# Patient Record
Sex: Female | Born: 1945
Health system: Southern US, Community
[De-identification: ages and names within clinical notes are randomized; demographics above are authoritative.]

## PROBLEM LIST (undated history)

## (undated) DIAGNOSIS — R0600 Dyspnea, unspecified: Secondary | ICD-10-CM

## (undated) DIAGNOSIS — I6529 Occlusion and stenosis of unspecified carotid artery: Secondary | ICD-10-CM

## (undated) DIAGNOSIS — F329 Major depressive disorder, single episode, unspecified: Secondary | ICD-10-CM

## (undated) DIAGNOSIS — K219 Gastro-esophageal reflux disease without esophagitis: Secondary | ICD-10-CM

## (undated) DIAGNOSIS — C801 Malignant (primary) neoplasm, unspecified: Secondary | ICD-10-CM

## (undated) DIAGNOSIS — I10 Essential (primary) hypertension: Secondary | ICD-10-CM

## (undated) DIAGNOSIS — I639 Cerebral infarction, unspecified: Secondary | ICD-10-CM

## (undated) DIAGNOSIS — F32A Depression, unspecified: Secondary | ICD-10-CM

## (undated) DIAGNOSIS — F419 Anxiety disorder, unspecified: Secondary | ICD-10-CM

## (undated) HISTORY — PX: ABDOMINAL HYSTERECTOMY: SHX81

## (undated) HISTORY — DX: Essential (primary) hypertension: I10

## (undated) HISTORY — DX: Occlusion and stenosis of unspecified carotid artery: I65.29

---

## 2017-04-01 ENCOUNTER — Encounter (HOSPITAL_COMMUNITY): Payer: Self-pay

## 2017-04-01 ENCOUNTER — Inpatient Hospital Stay (HOSPITAL_COMMUNITY)
Admission: EM | Admit: 2017-04-01 | Discharge: 2017-04-09 | DRG: 038 | Disposition: A | Discharge status: Rehabilitation Facility | Payer: Medicare Other | Attending: Internal Medicine | Admitting: Internal Medicine

## 2017-04-01 ENCOUNTER — Emergency Department (HOSPITAL_COMMUNITY): Payer: Medicare Other

## 2017-04-01 ENCOUNTER — Observation Stay (HOSPITAL_COMMUNITY): Payer: Medicare Other

## 2017-04-01 DIAGNOSIS — G8194 Hemiplegia, unspecified affecting left nondominant side: Secondary | ICD-10-CM | POA: Diagnosis not present

## 2017-04-01 DIAGNOSIS — I161 Hypertensive emergency: Secondary | ICD-10-CM | POA: Diagnosis not present

## 2017-04-01 DIAGNOSIS — I639 Cerebral infarction, unspecified: Secondary | ICD-10-CM

## 2017-04-01 DIAGNOSIS — G8918 Other acute postprocedural pain: Secondary | ICD-10-CM

## 2017-04-01 DIAGNOSIS — R531 Weakness: Secondary | ICD-10-CM

## 2017-04-01 DIAGNOSIS — R2990 Unspecified symptoms and signs involving the nervous system: Secondary | ICD-10-CM | POA: Diagnosis not present

## 2017-04-01 DIAGNOSIS — R739 Hyperglycemia, unspecified: Secondary | ICD-10-CM | POA: Diagnosis not present

## 2017-04-01 DIAGNOSIS — R29818 Other symptoms and signs involving the nervous system: Secondary | ICD-10-CM

## 2017-04-01 DIAGNOSIS — R29898 Other symptoms and signs involving the musculoskeletal system: Secondary | ICD-10-CM

## 2017-04-01 DIAGNOSIS — R269 Unspecified abnormalities of gait and mobility: Secondary | ICD-10-CM

## 2017-04-01 DIAGNOSIS — I6522 Occlusion and stenosis of left carotid artery: Secondary | ICD-10-CM

## 2017-04-01 DIAGNOSIS — E785 Hyperlipidemia, unspecified: Secondary | ICD-10-CM

## 2017-04-01 DIAGNOSIS — I63232 Cerebral infarction due to unspecified occlusion or stenosis of left carotid arteries: Principal | ICD-10-CM | POA: Diagnosis present

## 2017-04-01 DIAGNOSIS — I10 Essential (primary) hypertension: Secondary | ICD-10-CM

## 2017-04-01 DIAGNOSIS — I69398 Other sequelae of cerebral infarction: Secondary | ICD-10-CM

## 2017-04-01 DIAGNOSIS — Z8673 Personal history of transient ischemic attack (TIA), and cerebral infarction without residual deficits: Secondary | ICD-10-CM

## 2017-04-01 DIAGNOSIS — N390 Urinary tract infection, site not specified: Secondary | ICD-10-CM | POA: Diagnosis not present

## 2017-04-01 DIAGNOSIS — I69359 Hemiplegia and hemiparesis following cerebral infarction affecting unspecified side: Secondary | ICD-10-CM

## 2017-04-01 DIAGNOSIS — E876 Hypokalemia: Secondary | ICD-10-CM | POA: Diagnosis present

## 2017-04-01 DIAGNOSIS — R464 Slowness and poor responsiveness: Secondary | ICD-10-CM

## 2017-04-01 DIAGNOSIS — I63132 Cerebral infarction due to embolism of left carotid artery: Secondary | ICD-10-CM | POA: Diagnosis present

## 2017-04-01 DIAGNOSIS — Z9889 Other specified postprocedural states: Secondary | ICD-10-CM

## 2017-04-01 DIAGNOSIS — I679 Cerebrovascular disease, unspecified: Secondary | ICD-10-CM

## 2017-04-01 HISTORY — DX: Cerebral infarction, unspecified: I63.9

## 2017-04-01 LAB — DIFFERENTIAL
BASOS ABS: 0 10*3/uL (ref 0.0–0.1)
Basophils Relative: 1 %
EOS ABS: 0.1 10*3/uL (ref 0.0–0.7)
Eosinophils Relative: 1 %
LYMPHS PCT: 14 %
Lymphs Abs: 0.8 10*3/uL (ref 0.7–4.0)
Monocytes Absolute: 0.3 10*3/uL (ref 0.1–1.0)
Monocytes Relative: 5 %
NEUTROS ABS: 4.4 10*3/uL (ref 1.7–7.7)
NEUTROS PCT: 79 %

## 2017-04-01 LAB — COMPREHENSIVE METABOLIC PANEL
ALBUMIN: 3.7 g/dL (ref 3.5–5.0)
ALK PHOS: 68 U/L (ref 38–126)
ALT: 12 U/L — ABNORMAL LOW (ref 14–54)
AST: 17 U/L (ref 15–41)
Anion gap: 10 (ref 5–15)
BILIRUBIN TOTAL: 0.9 mg/dL (ref 0.3–1.2)
BUN: 9 mg/dL (ref 6–20)
CO2: 24 mmol/L (ref 22–32)
Calcium: 9.1 mg/dL (ref 8.9–10.3)
Chloride: 106 mmol/L (ref 101–111)
Creatinine, Ser: 0.81 mg/dL (ref 0.44–1.00)
GFR calc Af Amer: >60 mL/min (ref >60)
GFR calc non Af Amer: >60 mL/min (ref >60)
GLUCOSE: 119 mg/dL — AB (ref 65–99)
POTASSIUM: 3.2 mmol/L — AB (ref 3.5–5.1)
Sodium: 140 mmol/L (ref 135–145)
TOTAL PROTEIN: 6.7 g/dL (ref 6.5–8.1)

## 2017-04-01 LAB — CBC
HCT: 42.2 % (ref 36.0–46.0)
HEMOGLOBIN: 13.5 g/dL (ref 12.0–15.0)
MCH: 25.8 pg — ABNORMAL LOW (ref 26.0–34.0)
MCHC: 32 g/dL (ref 30.0–36.0)
MCV: 80.7 fL (ref 78.0–100.0)
Platelets: 161 10*3/uL (ref 150–400)
RBC: 5.23 MIL/uL — AB (ref 3.87–5.11)
RDW: 14 % (ref 11.5–15.5)
WBC: 5.6 10*3/uL (ref 4.0–10.5)

## 2017-04-01 LAB — URINALYSIS, ROUTINE W REFLEX MICROSCOPIC
Bilirubin Urine: NEGATIVE
Glucose, UA: NEGATIVE mg/dL
Ketones, ur: 80 mg/dL — AB
Nitrite: NEGATIVE
Protein, ur: NEGATIVE mg/dL
SPECIFIC GRAVITY, URINE: 1.021 (ref 1.005–1.030)
pH: 5 (ref 5.0–8.0)

## 2017-04-01 LAB — ETHANOL: Alcohol, Ethyl (B): <5 mg/dL (ref <5)

## 2017-04-01 LAB — APTT: APTT: 33 s (ref 24–36)

## 2017-04-01 LAB — RAPID URINE DRUG SCREEN, HOSP PERFORMED
Amphetamines: NOT DETECTED
BARBITURATES: NOT DETECTED
Benzodiazepines: NOT DETECTED
COCAINE: NOT DETECTED
Opiates: NOT DETECTED
TETRAHYDROCANNABINOL: NOT DETECTED

## 2017-04-01 LAB — I-STAT CHEM 8, ED
BUN: 9 mg/dL (ref 6–20)
CREATININE: 0.8 mg/dL (ref 0.44–1.00)
Calcium, Ion: 1.09 mmol/L — ABNORMAL LOW (ref 1.15–1.40)
Chloride: 104 mmol/L (ref 101–111)
Glucose, Bld: 117 mg/dL — ABNORMAL HIGH (ref 65–99)
HEMATOCRIT: 41 % (ref 36.0–46.0)
Hemoglobin: 13.9 g/dL (ref 12.0–15.0)
Potassium: 3.2 mmol/L — ABNORMAL LOW (ref 3.5–5.1)
Sodium: 141 mmol/L (ref 135–145)
TCO2: 25 mmol/L (ref 0–100)

## 2017-04-01 LAB — TROPONIN I

## 2017-04-01 LAB — PROTIME-INR
INR: 1.08
Prothrombin Time: 14.1 seconds (ref 11.4–15.2)

## 2017-04-01 LAB — I-STAT TROPONIN, ED: Troponin i, poc: 0 ng/mL (ref 0.00–0.08)

## 2017-04-01 MED ORDER — SODIUM CHLORIDE 0.9 % IV SOLN: 250.0000 mL | INTRAVENOUS | Status: DC | PRN

## 2017-04-01 MED ORDER — POTASSIUM CHLORIDE CRYS ER 20 MEQ PO TBCR
40.0000 meq | EXTENDED_RELEASE_TABLET | Freq: Two times a day (BID) | ORAL | Status: AC
  Administered 2017-04-01: 40 meq via ORAL
  Filled 2017-04-01: qty 2

## 2017-04-01 MED ORDER — SODIUM CHLORIDE 0.9% FLUSH
3.0000 mL | Freq: Two times a day (BID) | INTRAVENOUS | Status: DC
  Administered 2017-04-01 – 2017-04-09 (×7): 3 mL via INTRAVENOUS

## 2017-04-01 MED ORDER — SENNOSIDES-DOCUSATE SODIUM 8.6-50 MG PO TABS
1.0000 | ORAL_TABLET | Freq: Every evening | ORAL | Status: DC | PRN
  Administered 2017-04-05: 1 via ORAL
  Filled 2017-04-01: qty 1

## 2017-04-01 MED ORDER — SODIUM CHLORIDE 0.9 % IV SOLN
INTRAVENOUS | Status: DC
  Administered 2017-04-01: 15:00:00 via INTRAVENOUS

## 2017-04-01 MED ORDER — ONDANSETRON HCL 4 MG PO TABS: 4.0000 mg | ORAL_TABLET | Freq: Four times a day (QID) | ORAL | Status: DC | PRN

## 2017-04-01 MED ORDER — ASPIRIN 325 MG PO TABS
325.0000 mg | ORAL_TABLET | Freq: Every day | ORAL | Status: DC
  Administered 2017-04-01 – 2017-04-09 (×8): 325 mg via ORAL
  Filled 2017-04-01 (×8): qty 1

## 2017-04-01 MED ORDER — GADOBENATE DIMEGLUMINE 529 MG/ML IV SOLN
20.0000 mL | Freq: Once | INTRAVENOUS | Status: AC
  Administered 2017-04-01: 14 mL via INTRAVENOUS

## 2017-04-01 MED ORDER — LABETALOL HCL 5 MG/ML IV SOLN
10.0000 mg | INTRAVENOUS | Status: DC | PRN
  Administered 2017-04-01: 10 mg via INTRAVENOUS
  Filled 2017-04-01 (×2): qty 4

## 2017-04-01 MED ORDER — STROKE: EARLY STAGES OF RECOVERY BOOK
Freq: Once | Status: AC
  Administered 2017-04-01: 1
  Filled 2017-04-01: qty 1

## 2017-04-01 MED ORDER — SODIUM CHLORIDE 0.9% FLUSH: 3.0000 mL | INTRAVENOUS | Status: DC | PRN

## 2017-04-01 MED ORDER — ONDANSETRON HCL 4 MG/2ML IJ SOLN: 4.0000 mg | Freq: Four times a day (QID) | INTRAMUSCULAR | Status: DC | PRN

## 2017-04-01 MED ORDER — ACETAMINOPHEN 650 MG RE SUPP: 650.0000 mg | Freq: Four times a day (QID) | RECTAL | Status: DC | PRN

## 2017-04-01 MED ORDER — ENOXAPARIN SODIUM 40 MG/0.4ML ~~LOC~~ SOLN
40.0000 mg | SUBCUTANEOUS | Status: DC
  Administered 2017-04-01 – 2017-04-08 (×8): 40 mg via SUBCUTANEOUS
  Filled 2017-04-01 (×8): qty 0.4

## 2017-04-01 MED ORDER — ACETAMINOPHEN 325 MG PO TABS: 650.0000 mg | ORAL_TABLET | Freq: Four times a day (QID) | ORAL | Status: DC | PRN

## 2017-04-01 NOTE — ED Triage Notes (Signed)
Pt. Coming from home via [facility] for right arm paralysis since Sunday. Pt. Also notes right leg weakness. Pt. Has no medical or surgical hx. Daughter called EMS this morning after visiting her mother. Pt. Aox4. EDP at bedside.

## 2017-04-01 NOTE — ED Notes (Signed)
Neurology at bedside.

## 2017-04-01 NOTE — ED Notes (Signed)
Attempted report 

## 2017-04-01 NOTE — ED Notes (Signed)
Patient transported to CT on telemetry monitor at this time.

## 2017-04-01 NOTE — Evaluation (Addendum)
Physical Therapy Evaluation Patient Details Name: [name] [name] MRN: [id number] DOB: [birth year] Today's Date: 04/01/2017   History of Present Illness  Patient is a 71 yo female admitted 04/01/17 with RUE weakness and unsteady gait for several days.  Patient also with hypertensive urgency and hyperglycemia.   MRI of brain - Patchy acute infarcts in the left greater than right cerebral hemispheres with distribution suggesting watershed ischemia.     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  Will benefit from acute PT to maximize functional mobility prior to discharge.  Patient was independent and active pta - living alone and working.  Today patient required min-mod assist for bed mobility and to stand - unable to ambulate.  Recommend Inpatient Rehab consult to return patient to optimal functional level with goal to return home. Initiated education on supporting RUE by positioning on pillow, avoiding pulling on RUE during position changes.    Follow Up Recommendations CIR;Supervision for mobility/OOB    Equipment Recommendations  Other (comment) (TBD at next venue of care)    Recommendations for Other Services Rehab consult     Precautions / Restrictions Precautions Precautions: Fall Precaution Comments: Rt hemiparesis Restrictions Weight Bearing Restrictions: No      Mobility  Bed Mobility Overal bed mobility: Needs Assistance Bed Mobility: Supine to Sit;Sit to Supine     Supine to sit: Mod assist;HOB elevated Sit to supine: Mod assist;HOB elevated   General bed mobility comments: Verbal cues to move to sitting position.  Patient able to assist with moving RLE off of bed.  Required assist to manage RUE.  Assist to scoot hips to EOB in sitting.  Increased effort required for transition.  Patient able to maintain sitting balance with min guard assist.  However, patient with Rt shoulder/trunk was retracted, with Lt side more  forward.  Patient unaware of sitting posture.  Verbal and tactile cues to move to more symmetrical position.  Required assist to control trunk and bring LE's onto bed to return to supine.  Transfers Overall transfer level: Needs assistance Equipment used: 1 person hand held assist Transfers: Sit to/from Stand Sit to Stand: Min assist         General transfer comment: Verbal cues for placement of LUE.  Patient able to move to standing with min assist to rise to standing and for balance.  Worked with patient on posture, bringing Rt side forward.  Support provided to RUE.  Patient able to step forward/back with LLE - noted Rt knee instability.  Then attempted to step forward/back with RLE.  Difficulty advancing RLE and moving forward over RLE for weight bearing.  RLE fatigued quickly.  Ambulation/Gait             General Gait Details: NT  Stairs            Wheelchair Mobility    Modified Rankin (Stroke Patients Only) Modified Rankin (Stroke Patients Only) Pre-Morbid Rankin Score: No symptoms Modified Rankin: Moderately severe disability     Balance Overall balance assessment: Needs assistance Sitting-balance support: Single extremity supported;Feet supported Sitting balance-Leahy Scale: Fair     Standing balance support: Single extremity supported Standing balance-Leahy Scale: Poor Standing balance comment: Requires LUE support and external assist for balance.                             Pertinent Vitals/Pain Pain Assessment: No/denies pain  Home Living Family/patient expects to be discharged to:: Private residence Living Arrangements: Alone Available Help at Discharge: Family;Available PRN/intermittently Type of Home: House Home Access: Stairs to enter Entrance Stairs-Rails: None Entrance Stairs-Number of Steps: 4 Home Layout: One level Home Equipment: None      Prior Function Level of Independence: Independent         Comments: Drives      Hand Dominance   Dominant Hand: Right    Extremity/Trunk Assessment   Upper Extremity Assessment Upper Extremity Assessment: Defer to OT evaluation    Lower Extremity Assessment Lower Extremity Assessment: RLE deficits/detail RLE Deficits / Details: Decreased strength - hip flexion 3/5, knee extension 3+/5, DF 0/5.  When she attempts to DF ankle, her ankle moves into PF. RLE Coordination: decreased gross motor       Communication   Communication: Expressive difficulties  Cognition Arousal/Alertness: Awake/alert Behavior During Therapy: WFL for tasks assessed/performed Overall Cognitive Status: Impaired/Different from baseline Area of Impairment: Orientation;Following commands;Problem solving                 Orientation Level: Disoriented to;Place     Following Commands: Follows one step commands with increased time     Problem Solving: Difficulty sequencing;Requires verbal cues General Comments: Difficult at times to tell if patient doesn't know the answer or is having trouble saying the word.        General Comments      Exercises     Assessment/Plan    PT Assessment Patient needs continued PT services  PT Problem List Decreased strength;Decreased activity tolerance;Decreased balance;Decreased mobility;Decreased cognition;Decreased knowledge of use of DME;Decreased knowledge of precautions;Impaired tone       PT Treatment Interventions DME instruction;Gait training;Functional mobility training;Therapeutic activities;Therapeutic exercise;Balance training;Neuromuscular re-education;Cognitive remediation;Patient/family education    PT Goals (Current goals can be found in the Care Plan section)  Acute Rehab PT Goals Patient Stated Goal: To walk PT Goal Formulation: With patient/family Time For Goal Achievement: 04/08/17 Potential to Achieve Goals: Good    Frequency Min 4X/week   Barriers to discharge Decreased caregiver support Patient lives  alone.  Does not have 24 hour assist.    Co-evaluation               AM-PAC PT "6 Clicks" Daily Activity  Outcome Measure Difficulty turning over in bed (including adjusting bedclothes, sheets and blankets)?: Total Difficulty moving from lying on back to sitting on the side of the bed? : Total Difficulty sitting down on and standing up from a chair with arms (e.g., wheelchair, bedside commode, etc,.)?: Total Help needed moving to and from a bed to chair (including a wheelchair)?: A Little Help needed walking in hospital room?: A Lot Help needed climbing 3-5 steps with a railing? : Total 6 Click Score: 9    End of Session Equipment Utilized During Treatment: Gait belt Activity Tolerance: Patient limited by fatigue Patient left: in bed;with call bell/phone within reach;with bed alarm set;with family/visitor present;with SCD's reapplied Nurse Communication: Mobility status (RUE precautions; Recommend CIR) PT Visit Diagnosis: Unsteadiness on feet (R26.81);Other abnormalities of gait and mobility (R26.89);Hemiplegia and hemiparesis;Other symptoms and signs involving the nervous system (R29.898) Hemiplegia - Right/Left: Right Hemiplegia - dominant/non-dominant: Dominant Hemiplegia - caused by: Cerebral infarction    Time: 1610-9604 PT Time Calculation (min) (ACUTE ONLY): 43 min   Charges:   PT Evaluation $PT Eval Moderate Complexity: 1 Procedure PT Treatments $Therapeutic Activity: 23-37 mins   PT G Codes:   PT G-Codes **NOT FOR  INPATIENT CLASS** Functional Assessment Tool Used: AM-PAC 6 Clicks Basic Mobility;Clinical judgement Functional Limitation: Mobility: Walking and moving around Mobility: Walking and Moving Around Current Status (O9629): At least 60 percent but less than 80 percent impaired, limited or restricted Mobility: Walking and Moving Around Goal Status [phone]): At least 20 percent but less than 40 percent impaired, limited or restricted    Carita Pian. Sanjuana Kava,  [facility] Acute Rehab Services Pager [phone]   Despina Pole 04/01/2017, 10:09 PM

## 2017-04-01 NOTE — Progress Notes (Signed)
PT Cancellation Note  Patient Details Name: [name] [name] MRN: [id number] DOB: 04/25/1946   Cancelled Treatment:    Reason Eval/Treat Not Completed: Patient at procedure or test/unavailable Pt currently at MRI. Will check back as schedule allows.   Nicky Pugh, PT, DPT  Acute Rehabilitation Services  Pager: [phone]    Army Melia 04/01/2017, 3:03 PM

## 2017-04-01 NOTE — Consult Note (Signed)
Requesting Physician: Dr. Rex Kras    Chief Complaint: Right arm weakness  History obtained from:  Patient   HPI:                                                                                                                                         [name] [name] is an 71 y.o. female who was brought to the ED secondary to having right arm weakness for 4 days. She did not seek medical attention at that time for no known reason. She is very slow in her reaction and in HTN urgency currently. ED MD was unable to elicit any movement in her right arm. On my consultation she was very slow to answer questions, appeared to be detatched, was able to move arms but when asked to specifically move the are she would not. When arm held antigravity and asked to resist she was able to give good strength. Total consultation was inconsistent.   She states her right arm is decreased sensation compared to left, no leg involvement.   Found to have a UTI while in ED along with BP 225/110  Date last known well: Date: 03/29/2017 Time last known well: Unable to determine tPA Given: No: out of window   History reviewed. No pertinent past medical history.  History reviewed. No pertinent surgical history.  History reviewed. No pertinent family history. Social History:  reports that she has never smoked. She has never used smokeless tobacco. She reports that she does not drink alcohol or use drugs.  Allergies: No Known Allergies  Medications:                                                                                                                          none  ROS:  History obtained from the patient  General ROS: negative for - chills, fatigue, fever, night sweats, weight gain or weight loss Psychological ROS: negative for - behavioral disorder, hallucinations, memory  difficulties, mood swings or suicidal ideation Ophthalmic ROS: negative for - blurry vision, double vision, eye pain or loss of vision ENT ROS: negative for - epistaxis, nasal discharge, oral lesions, sore throat, tinnitus or vertigo Allergy and Immunology ROS: negative for - hives or itchy/watery eyes Hematological and Lymphatic ROS: negative for - bleeding problems, bruising or swollen lymph nodes Endocrine ROS: negative for - galactorrhea, hair pattern changes, polydipsia/polyuria or temperature intolerance Respiratory ROS: negative for - cough, hemoptysis, shortness of breath or wheezing Cardiovascular ROS: negative for - chest pain, dyspnea on exertion, edema or irregular heartbeat Gastrointestinal ROS: negative for - abdominal pain, diarrhea, hematemesis, nausea/vomiting or stool incontinence Genito-Urinary ROS: negative for - dysuria, hematuria, incontinence or urinary frequency/urgency Musculoskeletal ROS: negative for - joint swelling or muscular weakness Neurological ROS: as noted in HPI Dermatological ROS: negative for rash and skin lesion changes  Neurologic Examination:                                                                                                      Blood pressure (!) 192/83, pulse 81, temperature 98.8 F (37.1 C), resp. rate 20, height 5\' 8"  (1.727 m), weight 63.5 kg (140 lb), SpO2 96 %.  HEENT-  Normocephalic, no lesions, without obvious abnormality.  Normal external eye and conjunctiva.  Normal TM's bilaterally.  Normal auditory canals and external ears. Normal external nose, mucus membranes and septum.  Normal pharynx. Cardiovascular- regular rate and rhythm, S1, S2 normal, no murmur, click, rub or gallop, pulses palpable throughout   Lungs- chest clear, no wheezing, rales, normal symmetric air entry, Heart exam - S1, S2 normal, no murmur, no gallop, rate regular Abdomen- normal findings: bowel sounds normal Extremities- no edema Lymph-no adenopathy  palpable Musculoskeletal-no joint tenderness, deformity or swelling Skin-warm and dry, no hyperpigmentation, vitiligo, or suspicious lesions  Neurological Examination Mental Status: Alert, not oriented to hospital or year, thought content appropriate.  Speech fluent without evidence of aphasia.  Able to simple commands only Cranial Nerves: II:  Visual fields grossly normal,  III,IV, VI: ptosis not present, extra-ocular motions intact bilaterally, pupils equal, round, reactive to light and accommodation V,VII: smile symmetric, facial light touch sensation normal bilaterally VIII: hearing normal bilaterally IX,X: uvula rises symmetrically XI: bilateral shoulder shrug XII: midline tongue extension Motor:--Very inconsistent on the right arm Right : Upper extremity   5/5    Left:     Upper extremity   5/5  Lower extremity   5/5     Lower extremity   5/5 Tone and bulk:normal tone throughout; no atrophy noted Sensory: Pinprick and light touch intact throughout, bilaterally Deep Tendon Reflexes: 2+ and symmetric throughout Plantars: Right: downgoing   Left: downgoing Cerebellar: normal finger-to-nose on the left arm,  and normal heel-to-shin test Gait: not tested       Lab Results: Basic Metabolic Panel:  Recent Labs Lab 04/01/17 0920 04/01/17 0932  NA 140 141  K 3.2* 3.2*  CL 106 104  CO2 24  --   GLUCOSE 119* 117*  BUN 9 9  CREATININE 0.81 0.80  CALCIUM 9.1  --     Liver Function Tests:  Recent Labs Lab 04/01/17 0920  AST 17  ALT 12*  ALKPHOS 68  BILITOT 0.9  PROT 6.7  ALBUMIN 3.7   No results for input(s): LIPASE, AMYLASE in the last 168 hours. No results for input(s): AMMONIA in the last 168 hours.  CBC:  Recent Labs Lab 04/01/17 0920 04/01/17 0932  WBC 5.6  --   NEUTROABS 4.4  --   HGB 13.5 13.9  HCT 42.2 41.0  MCV 80.7  --   PLT 161  --     Cardiac Enzymes: No results for input(s): CKTOTAL, CKMB, CKMBINDEX, TROPONINI in the last 168  hours.  Lipid Panel: No results for input(s): CHOL, TRIG, HDL, CHOLHDL, VLDL, LDLCALC in the last 168 hours.  CBG: No results for input(s): GLUCAP in the last 168 hours.  Microbiology: No results found for this or any previous visit.  Coagulation Studies:  Recent Labs  04/01/17 0920  LABPROT 14.1  INR 1.08    Imaging: Ct Head Wo Contrast  Result Date: 04/01/2017 CLINICAL DATA:  Right arm paralysis for several days, initial encounter EXAM: CT HEAD WITHOUT CONTRAST TECHNIQUE: Contiguous axial images were obtained from the base of the skull through the vertex without intravenous contrast. COMPARISON:  None. FINDINGS: Brain: Chronic white matter ischemic change is seen. No findings to suggest acute hemorrhage, acute infarction or space-occupying mass lesion are noted. Prior lacunar infarcts are noted within the internal capsule on the right in the basal ganglia on the left. Vascular: No hyperdense vessel or unexpected calcification. Skull: Normal. Negative for fracture or focal lesion. Sinuses/Orbits: No acute finding. Other: None. IMPRESSION: Chronic white matter ischemic change. Lacunar infarcts are noted. No acute abnormality is seen. Electronically Signed   By: Inez Catalina M.D.   On: 04/01/2017 10:53       Assessment and plan discussed with with attending physician and they are in agreement.    Etta Quill PA-C [facility] [phone]  04/01/2017, 11:59 AM   I have seen the patient reviewed the above note. She has predominantly extensor weakness of the right arm, with minimal triceps but good biceps, wrist drop.    Assessment: 70 y.o. female with right arm weakness and an unusual distribution.Though this predominantly affects the radial distribution, there is also involvement of finger abduction which would be an ulnar innervated movement. It is an unusual distribution, which does raise the question of central pathology. In addition to an MRI of the brain, I would  favor getting an MRI of the cervical spine and brachial plexus.  Stroke Risk Factors - none--HTN in room  1) MRI brain, C-spine, brachial plexus 2) further recommendations following above imaging  Roland Rack, MD [facility] [phone]  If 7pm- 7am, please page neurology on call as listed in [place].

## 2017-04-01 NOTE — Progress Notes (Signed)
Called to get report, nurse at lunch.

## 2017-04-01 NOTE — H&P (Signed)
[facility] History and Physical  [name] [name] QBH:[id number] DOB: January 06, 1946 DOA: 04/01/2017  Referring physician: Dr Rex Kras, MDED PCP: Patient, No Pcp Per   Chief Complaint: right arm weakness  HPI: [name] [name] is a 71 y.o. female with no know pmh though has never received regular medical care. Pt presents with right arm weakness and unsteady gait x 2 days. Daughter noticing symptoms and progressive unsteady gait prompted pt to come to ED. Pt states right arm weakness has actually improved since Sunday. She denies recent headache, dizziness, chest pain, sob, abdominal pain, n/v/d, no recent fever or cough.   ED COURSE Labs mostly unremarkable aside from mild hypokalemia and mild hyperglycemia. CT head shows chronic white matter changes and lacunar infarcts with no acute findings.   Review of Systems:  As stated in hpi, otherwise negative   Social History:  reports that she has never smoked. She has never used smokeless tobacco. She reports that she does not drink alcohol or use drugs. Pt lives independently. Works with [employer]. I daughter.   Family History: Mother deceased in 39's -cancer (unknown source). Father deceased (unknown).   No Known Allergies    Prior to Admission medications   Medication Sig Start Date End Date Taking? Authorizing Provider  Multiple Vitamins-Minerals (MULTIVITAMIN WITH MINERALS) tablet Take 1 tablet by mouth daily.   Yes [provider]  Tetrahydrozoline-Zn Sulfate (EYE DROPS ALLERGY RELIEF OP) Place 2 drops into both eyes 2 (two) times daily.   Yes [provider]   Physical Exam: Vitals:   04/01/17 1044 04/01/17 1115 04/01/17 1200 04/01/17 1230  BP:  (!) 192/83 (!) 208/84 (!) 201/91  Pulse:  81 87 83  Resp:  20 19 20   Temp: 98.8 F (37.1 C)     TempSrc:      SpO2:  96% 98% 97%  Weight:      Height:        Wt Readings from Last 3 Encounters:  04/01/17 63.5 kg (140 lb)    General: awake, alert  lying comfortably in bed in NAD Eyes: PERRL, EOMI, normal lids, iris ENT: grossly normal hearing, lips & tongue Neck: no LAD, masses or thyromegaly Cardiovascular: S1S@ RRR, no m/r/g. No LE edema.  Respiratory: CTA bilaterally, no w/r/r. Normal respiratory effort. Abdomen: soft, ntnd, BS+ Skin: no rash or induration seen on limited exam Musculoskeletal: grossly normal tone BUE/BLE Psychiatric: normal mood, flat affect, speech fluent and appropriate Neurologic: AO to name and that she is in hospital, EOMI, tongue is midline, grip strength 5/5 bilateral upper extremities thought 3/5 upon abduction of arm. DTRs 2+ and symmetric throughout. Gait not tested           Labs on Admission:  Basic Metabolic Panel:  Recent Labs Lab 04/01/17 0920 04/01/17 0932  NA 140 141  K 3.2* 3.2*  CL 106 104  CO2 24  --   GLUCOSE 119* 117*  BUN 9 9  CREATININE 0.81 0.80  CALCIUM 9.1  --    Liver Function Tests:  Recent Labs Lab 04/01/17 0920  AST 17  ALT 12*  ALKPHOS 68  BILITOT 0.9  PROT 6.7  ALBUMIN 3.7   No results for input(s): LIPASE, AMYLASE in the last 168 hours. No results for input(s): AMMONIA in the last 168 hours. CBC:  Recent Labs Lab 04/01/17 0920 04/01/17 0932  WBC 5.6  --   NEUTROABS 4.4  --   HGB 13.5 13.9  HCT 42.2 41.0  MCV 80.7  --  PLT 161  --    Cardiac Enzymes: No results for input(s): CKTOTAL, CKMB, CKMBINDEX, TROPONINI in the last 168 hours.  BNP (last 3 results) No results for input(s): BNP in the last 8760 hours.  ProBNP (last 3 results) No results for input(s): PROBNP in the last 8760 hours.   Serum creatinine: 0.8 mg/dL 04/01/17 0932 Estimated creatinine clearance: 67.5 mL/min  CBG: No results for input(s): GLUCAP in the last 168 hours.  Radiological Exams on Admission: Ct Head Wo Contrast  Result Date: 04/01/2017 CLINICAL DATA:  Right arm paralysis for several days, initial encounter EXAM: CT HEAD WITHOUT CONTRAST TECHNIQUE: Contiguous  axial images were obtained from the base of the skull through the vertex without intravenous contrast. COMPARISON:  None. FINDINGS: Brain: Chronic white matter ischemic change is seen. No findings to suggest acute hemorrhage, acute infarction or space-occupying mass lesion are noted. Prior lacunar infarcts are noted within the internal capsule on the right in the basal ganglia on the left. Vascular: No hyperdense vessel or unexpected calcification. Skull: Normal. Negative for fracture or focal lesion. Sinuses/Orbits: No acute finding. Other: None. IMPRESSION: Chronic white matter ischemic change. Lacunar infarcts are noted. No acute abnormality is seen. Electronically Signed   By: Inez Catalina M.D.   On: 04/01/2017 10:53    EKG: Independently reviewed. NSR @ 91bpm, no acute findings. No old ekgs available for comparison  Assessment/Plan Patient Active Problem List   Diagnosis Date Noted  . Hypertensive emergency 04/01/2017  . Hypokalemia 04/01/2017  . Hyperglycemia 04/01/2017  . Stroke-like symptoms 04/01/2017  . CVA (cerebral vascular accident) ([place]) 04/01/2017    Stroke like symptoms, RUE weakness -exam appears inconsistent among providers. BP quite elevated on arrival 208/84.  -Check MRI/MRA brain r/o CVA vs PRES vs other -with current symptoms + old infarct on CT, check carotid dopplers, echo. FLP, A1C for risk stratification -asa qd for now -will await neurology recs for further workup  Hypertensive emergency -likely chronic essential hypertension -2 days out from onset so permissive hypertension not helpful -prn labetalol for now  Hyperglycemia, mild -check A1C -monitor with labs. No insulin coverage for now  Other issues -mention of UTI in other notes. Appears to be a suboptimal collection. Will ask for recollection and culture. Pt asymptomatic so will hold off on tx   Code Status: full code DVT Prophylaxis: SQ Lovenox Family Communication: daughter, [name] [name], at bedside on  exam Disposition Plan: Pending Improvement    [place], Lynnell Jude, NP [phone] [facility] [email or web address] Password [email or web address]

## 2017-04-01 NOTE — ED Provider Notes (Signed)
[facility] DEPT Provider Note   CSN: [id number] Arrival date & time: 04/01/17  4098     History   Chief Complaint Chief Complaint  Patient presents with  . Extremity Weakness    paralysis     HPI [name] [name] is a 71 y.o. female.  71 year old female with no known past medical problems who presents with right arm weakness. The patient states that she began noticing right arm weakness 2 days ago. She has also noticed some right leg weakness with it. Daughter visited her mother this morning and called EMS. She denies any altered sensation, N/V, headache, vision problems, fever, cough/cold sx, diarrhea, urinary symptoms, or recent illness. Daughter denies any confusion or speech problems. Pt does not follow w/ PCP, denies any medical problems. Non-smoker, no FH of stroke.   The history is provided by the patient.  Extremity Weakness     History reviewed. No pertinent past medical history.  Patient Active Problem List   Diagnosis Date Noted  . Hypertensive urgency 04/01/2017  . Hypokalemia 04/01/2017  . Hyperglycemia 04/01/2017  . CVA (cerebral vascular accident) ([place]) 04/01/2017    History reviewed. No pertinent surgical history.  OB History    No data available       Home Medications    Prior to Admission medications   Not on File    Family History History reviewed. No pertinent family history.  Social History Social History  Substance Use Topics  . Smoking status: Never Smoker  . Smokeless tobacco: Never Used  . Alcohol use No     Allergies   Patient has no known allergies.   Review of Systems Review of Systems  Musculoskeletal: Positive for extremity weakness.   All other systems reviewed and are negative except that which was mentioned in HPI  Physical Exam Updated Vital Signs BP (!) 192/83   Pulse 81   Temp 98.8 F (37.1 C)   Resp 20   Ht 5\' 8"  (1.727 m)   Wt 63.5 kg (140 lb)   SpO2 96%   BMI 21.29 kg/m   Physical Exam    Constitutional: She is oriented to person, place, and time. She appears well-developed and well-nourished. No distress.  HENT:  Head: Normocephalic and atraumatic.  Moist mucous membranes  Eyes: Conjunctivae are normal. Pupils are equal, round, and reactive to light.  Neck: Neck supple.  Cardiovascular: Normal rate and regular rhythm.   Murmur heard.  Systolic murmur is present with a grade of 1/6  Pulmonary/Chest: Effort normal and breath sounds normal.  Abdominal: Soft. Bowel sounds are normal. She exhibits no distension. There is no tenderness.  Musculoskeletal: She exhibits no edema.  Neurological: She is alert and oriented to person, place, and time. She displays normal reflexes. No cranial nerve deficit.  Fluent speech although pauses before responding Flaccid paralysis R arm, 5/5 strength LUE, BLE, increased tone/stiffness of R arm Normal sensation throughout Unable to understand EOM testing thus unable to confirm EOMI  Skin: Skin is warm and dry.  Psychiatric:  Flat affect  Nursing note and vitals reviewed.    ED Treatments / Results  Labs (all labs ordered are listed, but only abnormal results are displayed) Labs Reviewed  CBC - Abnormal; Notable for the following:       Result Value   RBC 5.23 (*)    MCH 25.8 (*)    All other components within normal limits  COMPREHENSIVE METABOLIC PANEL - Abnormal; Notable for the following:    Potassium 3.2 (*)  Glucose, Bld 119 (*)    ALT 12 (*)    All other components within normal limits  URINALYSIS, ROUTINE W REFLEX MICROSCOPIC - Abnormal; Notable for the following:    Color, Urine AMBER (*)    APPearance CLOUDY (*)    Hgb urine dipstick MODERATE (*)    Ketones, ur 80 (*)    Leukocytes, UA LARGE (*)    Bacteria, UA MANY (*)    Squamous Epithelial / LPF 0-5 (*)    Non Squamous Epithelial 0-5 (*)    All other components within normal limits  I-STAT CHEM 8, ED - Abnormal; Notable for the following:    Potassium 3.2  (*)    Glucose, Bld 117 (*)    Calcium, Ion 1.09 (*)    All other components within normal limits  ETHANOL  PROTIME-INR  APTT  DIFFERENTIAL  RAPID URINE DRUG SCREEN, HOSP PERFORMED  CBC  CREATININE, SERUM  I-STAT TROPOININ, ED    EKG  EKG Interpretation  Date/Time:  Tuesday Apr 01 2017 09:07:01 EDT Ventricular Rate:  91 PR Interval:    QRS Duration: 96 QT Interval:  364 QTC Calculation: 448 R Axis:   -35 Text Interpretation:  Sinus rhythm Probable left atrial enlargement Left axis deviation Anteroseptal infarct, old No previous ECGs available Confirmed by Theotis Burrow [phone]) on 04/01/2017 9:26:57 AM       Radiology Ct Head Wo Contrast  Result Date: 04/01/2017 CLINICAL DATA:  Right arm paralysis for several days, initial encounter EXAM: CT HEAD WITHOUT CONTRAST TECHNIQUE: Contiguous axial images were obtained from the base of the skull through the vertex without intravenous contrast. COMPARISON:  None. FINDINGS: Brain: Chronic white matter ischemic change is seen. No findings to suggest acute hemorrhage, acute infarction or space-occupying mass lesion are noted. Prior lacunar infarcts are noted within the internal capsule on the right in the basal ganglia on the left. Vascular: No hyperdense vessel or unexpected calcification. Skull: Normal. Negative for fracture or focal lesion. Sinuses/Orbits: No acute finding. Other: None. IMPRESSION: Chronic white matter ischemic change. Lacunar infarcts are noted. No acute abnormality is seen. Electronically Signed   By: Inez Catalina M.D.   On: 04/01/2017 10:53    Procedures Procedures (including critical care time)  Medications Ordered in ED Medications  enoxaparin (LOVENOX) injection 40 mg (not administered)  sodium chloride flush (NS) 0.9 % injection 3 mL (not administered)  sodium chloride flush (NS) 0.9 % injection 3 mL (not administered)  0.9 %  sodium chloride infusion (not administered)  acetaminophen (TYLENOL) tablet 650 mg  (not administered)    Or  acetaminophen (TYLENOL) suppository 650 mg (not administered)  ondansetron (ZOFRAN) tablet 4 mg (not administered)    Or  ondansetron (ZOFRAN) injection 4 mg (not administered)  senna-docusate (Senokot-S) tablet 1 tablet (not administered)     Initial Impression / Assessment and Plan / ED Course  I have reviewed the triage vital signs and the nursing notes.  Pertinent labs & imaging results that were available during my care of the patient were reviewed by me and considered in my medical decision making (see chart for details).    PT p/w 2 days of Right arm weakness, daughter saw patient this morning and called EMS. On exam she was comfortable, in no acute distress. Cranial nerves intact although the patient was unable to demonstrate extraocular movements, it was unclear whether she did not understand commands. She was unable to move her right arm for me. She reported right leg weakness  but I cannot appreciate significant asymmetric weakness on exam. She did appear to have slowed response time and her daughter states that this is unusual for her. Obtained above lab work and head CT.  Head CT negative for acute process, does demonstrate old lacunar infarcts. Labwork shows normal creatinine, normal CBC and CMP, UA consistent with infection. I added urine culture. I discussed with neurology team. Pt was later able to demonstrate R arm strength and her exam has been inconsistent between providers. Her BP has been elevated which raises concern for possible encephalopathy the patient does not follow regularly with a PCP and it is unclear how new this hypertension is. I discussed admission with hospitalist, APP [place], and pt admitted for further care.   Final Clinical Impressions(s) / ED Diagnoses   Final diagnoses:  Right arm weakness  Slowness and poor responsiveness    New Prescriptions New Prescriptions   No medications on file     Nikolette Reindl, Wenda Overland,  MD 04/01/17 1213

## 2017-04-01 NOTE — Progress Notes (Signed)
SLP Cancellation Note  Patient Details Name: [name] [name] MRN: [id number] DOB: 03/24/1946   Cancelled treatment:       Reason Eval/Treat Not Completed: Patient at procedure or test/unavailable   Juan Quam Laurice 04/01/2017, 3:37 PM

## 2017-04-02 ENCOUNTER — Encounter (HOSPITAL_COMMUNITY): Payer: BLUE CROSS/BLUE SHIELD

## 2017-04-02 ENCOUNTER — Observation Stay (HOSPITAL_COMMUNITY): Payer: Medicare Other

## 2017-04-02 DIAGNOSIS — R269 Unspecified abnormalities of gait and mobility: Secondary | ICD-10-CM

## 2017-04-02 DIAGNOSIS — Z8673 Personal history of transient ischemic attack (TIA), and cerebral infarction without residual deficits: Secondary | ICD-10-CM | POA: Diagnosis not present

## 2017-04-02 DIAGNOSIS — I6521 Occlusion and stenosis of right carotid artery: Secondary | ICD-10-CM | POA: Diagnosis not present

## 2017-04-02 DIAGNOSIS — I169 Hypertensive crisis, unspecified: Secondary | ICD-10-CM | POA: Diagnosis present

## 2017-04-02 DIAGNOSIS — I63413 Cerebral infarction due to embolism of bilateral middle cerebral arteries: Secondary | ICD-10-CM | POA: Diagnosis not present

## 2017-04-02 DIAGNOSIS — I69319 Unspecified symptoms and signs involving cognitive functions following cerebral infarction: Secondary | ICD-10-CM | POA: Diagnosis not present

## 2017-04-02 DIAGNOSIS — I69359 Hemiplegia and hemiparesis following cerebral infarction affecting unspecified side: Secondary | ICD-10-CM | POA: Diagnosis not present

## 2017-04-02 DIAGNOSIS — I161 Hypertensive emergency: Secondary | ICD-10-CM | POA: Diagnosis not present

## 2017-04-02 DIAGNOSIS — N390 Urinary tract infection, site not specified: Secondary | ICD-10-CM | POA: Diagnosis present

## 2017-04-02 DIAGNOSIS — R29898 Other symptoms and signs involving the musculoskeletal system: Secondary | ICD-10-CM | POA: Diagnosis not present

## 2017-04-02 DIAGNOSIS — R464 Slowness and poor responsiveness: Secondary | ICD-10-CM | POA: Diagnosis not present

## 2017-04-02 DIAGNOSIS — G8194 Hemiplegia, unspecified affecting left nondominant side: Secondary | ICD-10-CM | POA: Diagnosis present

## 2017-04-02 DIAGNOSIS — G8918 Other acute postprocedural pain: Secondary | ICD-10-CM | POA: Diagnosis not present

## 2017-04-02 DIAGNOSIS — E785 Hyperlipidemia, unspecified: Secondary | ICD-10-CM | POA: Diagnosis not present

## 2017-04-02 DIAGNOSIS — I639 Cerebral infarction, unspecified: Secondary | ICD-10-CM | POA: Diagnosis not present

## 2017-04-02 DIAGNOSIS — I63232 Cerebral infarction due to unspecified occlusion or stenosis of left carotid arteries: Secondary | ICD-10-CM | POA: Diagnosis not present

## 2017-04-02 DIAGNOSIS — I69398 Other sequelae of cerebral infarction: Secondary | ICD-10-CM

## 2017-04-02 DIAGNOSIS — I10 Essential (primary) hypertension: Secondary | ICD-10-CM | POA: Diagnosis present

## 2017-04-02 DIAGNOSIS — R531 Weakness: Secondary | ICD-10-CM | POA: Diagnosis not present

## 2017-04-02 DIAGNOSIS — I6789 Other cerebrovascular disease: Secondary | ICD-10-CM | POA: Diagnosis not present

## 2017-04-02 DIAGNOSIS — R2990 Unspecified symptoms and signs involving the nervous system: Secondary | ICD-10-CM

## 2017-04-02 DIAGNOSIS — R739 Hyperglycemia, unspecified: Secondary | ICD-10-CM | POA: Diagnosis not present

## 2017-04-02 DIAGNOSIS — I6522 Occlusion and stenosis of left carotid artery: Secondary | ICD-10-CM | POA: Diagnosis present

## 2017-04-02 DIAGNOSIS — Z9889 Other specified postprocedural states: Secondary | ICD-10-CM | POA: Diagnosis not present

## 2017-04-02 DIAGNOSIS — I638 Other cerebral infarction: Secondary | ICD-10-CM | POA: Diagnosis not present

## 2017-04-02 DIAGNOSIS — E876 Hypokalemia: Secondary | ICD-10-CM

## 2017-04-02 DIAGNOSIS — Z79899 Other long term (current) drug therapy: Secondary | ICD-10-CM | POA: Diagnosis not present

## 2017-04-02 DIAGNOSIS — I6330 Cerebral infarction due to thrombosis of unspecified cerebral artery: Secondary | ICD-10-CM

## 2017-04-02 DIAGNOSIS — I69351 Hemiplegia and hemiparesis following cerebral infarction affecting right dominant side: Secondary | ICD-10-CM | POA: Diagnosis present

## 2017-04-02 LAB — CBC
HEMATOCRIT: 45.9 % (ref 36.0–46.0)
Hemoglobin: 14.8 g/dL (ref 12.0–15.0)
MCH: 26.2 pg (ref 26.0–34.0)
MCHC: 32.2 g/dL (ref 30.0–36.0)
MCV: 81.2 fL (ref 78.0–100.0)
PLATELETS: 172 10*3/uL (ref 150–400)
RBC: 5.65 MIL/uL — ABNORMAL HIGH (ref 3.87–5.11)
RDW: 14.2 % (ref 11.5–15.5)
WBC: 3.6 10*3/uL — AB (ref 4.0–10.5)

## 2017-04-02 LAB — URINE CULTURE: SPECIAL REQUESTS: NORMAL

## 2017-04-02 LAB — BASIC METABOLIC PANEL
ANION GAP: 7 (ref 5–15)
BUN: 9 mg/dL (ref 6–20)
CALCIUM: 9 mg/dL (ref 8.9–10.3)
CO2: 26 mmol/L (ref 22–32)
Chloride: 107 mmol/L (ref 101–111)
Creatinine, Ser: 0.83 mg/dL (ref 0.44–1.00)
Glucose, Bld: 93 mg/dL (ref 65–99)
Potassium: 3.9 mmol/L (ref 3.5–5.1)
SODIUM: 140 mmol/L (ref 135–145)

## 2017-04-02 LAB — LIPID PANEL
CHOL/HDL RATIO: 3.1 ratio
Cholesterol: 211 mg/dL — ABNORMAL HIGH (ref 0–200)
HDL: 69 mg/dL (ref >40)
LDL CALC: 130 mg/dL — AB (ref 0–99)
TRIGLYCERIDES: 59 mg/dL (ref <150)
VLDL: 12 mg/dL (ref 0–40)

## 2017-04-02 MED ORDER — CLOPIDOGREL BISULFATE 75 MG PO TABS
75.0000 mg | ORAL_TABLET | Freq: Every day | ORAL | Status: DC
  Administered 2017-04-02 – 2017-04-09 (×7): 75 mg via ORAL
  Filled 2017-04-02 (×7): qty 1

## 2017-04-02 MED ORDER — ATORVASTATIN CALCIUM 80 MG PO TABS
80.0000 mg | ORAL_TABLET | Freq: Every day | ORAL | Status: DC
  Administered 2017-04-02 – 2017-04-08 (×7): 80 mg via ORAL
  Filled 2017-04-02 (×7): qty 1

## 2017-04-02 NOTE — Progress Notes (Signed)
STROKE TEAM PROGRESS NOTE   HISTORY OF PRESENT ILLNESS (per record) [name] [name] is a 71 y.o. female who was brought to the ED secondary to having right arm weakness for 4 days. She did not seek medical attention at that time for no known reason. She is very slow in her reaction and in HTN urgency currently. ED MD was unable to elicit any movement in her right arm. On my consultation she was very slow to answer questions, appeared to be detatched, was able to move arms but when asked to specifically move the arm she would not. When arm held antigravity and asked to resist she was able to give good strength. Total consultation was inconsistent.   She states her right arm has decreased sensation compared to left, no leg involvement.   Found to have a UTI while in ED along with BP 225/110  Date last known well: Date: 03/29/2017 Time last known well: Unable to determine tPA Given: No: out of window   SUBJECTIVE (INTERVAL HISTORY) Her daughter and sister are at the bedside.  Pt has mild abulia. MRA showed diffuse athero. Further work up pending. Consider TEE and loop.     OBJECTIVE Temp:  [97.8 F (36.6 C)-98.7 F (37.1 C)] 98.7 F (37.1 C) (05/23 1348) Pulse Rate:  [65-76] 68 (05/23 1348) Cardiac Rhythm: Normal sinus rhythm (05/23 0700) Resp:  [18-20] 20 (05/23 1348) BP: (136-190)/(61-89) 136/66 (05/23 1348) SpO2:  [98 %-99 %] 98 % (05/23 1348)  CBC:   Recent Labs Lab 04/01/17 0920 04/01/17 0932 04/02/17 0814  WBC 5.6  --  3.6*  NEUTROABS 4.4  --   --   HGB 13.5 13.9 14.8  HCT 42.2 41.0 45.9  MCV 80.7  --  81.2  PLT 161  --  563    Basic Metabolic Panel:   Recent Labs Lab 04/01/17 0920 04/01/17 0932 04/02/17 0814  NA 140 141 140  K 3.2* 3.2* 3.9  CL 106 104 107  CO2 24  --  26  GLUCOSE 119* 117* 93  BUN 9 9 9   CREATININE 0.81 0.80 0.83  CALCIUM 9.1  --  9.0    Lipid Panel:     Component Value Date/Time   CHOL 211 (H) 04/02/2017 0814   TRIG 59  04/02/2017 0814   HDL 69 04/02/2017 0814   CHOLHDL 3.1 04/02/2017 0814   VLDL 12 04/02/2017 0814   LDLCALC 130 (H) 04/02/2017 0814   HgbA1c: No results found for: HGBA1C Urine Drug Screen:     Component Value Date/Time   LABOPIA NONE DETECTED 04/01/2017 0920   COCAINSCRNUR NONE DETECTED 04/01/2017 0920   LABBENZ NONE DETECTED 04/01/2017 0920   AMPHETMU NONE DETECTED 04/01/2017 0920   THCU NONE DETECTED 04/01/2017 0920   LABBARB NONE DETECTED 04/01/2017 0920    Alcohol Level     Component Value Date/Time   ETH <5 04/01/2017 0920    IMAGING I have personally reviewed the radiological images below and agree with the radiology interpretations.  Ct Head Wo Contrast 04/01/2017 Chronic white matter ischemic change. Lacunar infarcts are noted. No acute abnormality is seen.   Mr [name] [name] Head/brain Wo Cm 04/01/2017 1. Patchy acute infarcts in the left greater than right cerebral hemispheres with distribution suggesting watershed ischemia.  2. Age advanced chronic [name] [name] vessel ischemic disease with numerous chronic infarcts as above.  3. Mild prominence of the pituitary gland for age. Consider laboratory correlation.  4. Advanced intracranial atherosclerosis as above including severe right and moderate  left ICA stenoses, severe bilateral vertebral artery stenoses, and severe right P2 PCA stenosis.   Mr Chest W Wo Contrast 04/02/2017 1. No significant abnormality of the brachial plexus is identified.  2. Cervical and thoracic spondylosis.  3. 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   Mr Cervical Spine Wo Contrast 04/01/2017 Mild cervical disc degeneration without high-grade stenosis as above.   CUS pending  TTE pending  TEE pending  LE venous doppler pending   PHYSICAL EXAM  Temp:  [97.8 F (36.6 C)-98.7 F (37.1 C)] 98.5 F (36.9 C) (05/23 2117) Pulse Rate:  [65-73] 73 (05/23 2117) Resp:  [18-20] 20 (05/23 2117) BP: (136-180)/(61-70) 156/69 (05/23  2117) SpO2:  [98 %-99 %] 98 % (05/23 2117)  General - Well nourished, well developed, in no apparent distress.  Ophthalmologic - Fundi not visualized due to noncooperation.  Cardiovascular - Regular rate and rhythm.  Mental Status -  Level of arousal and orientation to self, age, place, and person were intact, but not orientated to time. Language including expression, naming, repetition, comprehension was assessed and found intact, but mild abulia and psychomotor slowing  Cranial Nerves II - XII - II - Visual field intact OU. III, IV, VI - Extraocular movements intact. V - Facial sensation intact bilaterally. VII - Facial movement intact bilaterally. VIII - Hearing & vestibular intact bilaterally. X - Palate elevates symmetrically. XI - Chin turning & shoulder shrug intact bilaterally. XII - Tongue protrusion intact.  Motor Strength - The patient's strength was normal in all extremities except RUE proximal 4/5, distal 0/5. Pt does have mild neglect of the right arm.  Bulk was normal and fasciculations were absent.   Motor Tone - Muscle tone was assessed at the neck and appendages and was normal.  Reflexes - The patient's reflexes were 1+ in all extremities and she had no pathological reflexes.  Sensory - Light touch, temperature/pinprick were assessed and were symmetrical.    Coordination - The patient had normal movements in the left hand with no ataxia or dysmetria.  Tremor was absent.  Gait and Station - deferred  ASSESSMENT/PLAN [name] is a 71 y.o. female with history of previous strokes  presenting with right upper extremity weakness, hypertension urgency, urinary tract infection, and altered mental status.  She did not receive IV t-PA due to late presentation.  Strokes: Patchy acute infarcts in the left MCA and b/l ACAs, embolic pattern but pt does have severe diffuse atherosclerosis.   Resultant  RUE weakness with mild neglect on the RUE  CT head - Chronic  white matter ischemic change. Lacunar infarcts are noted. No acute abnormality is seen.   MRI head - Patchy acute infarcts in the left greater than right cerebral hemispheres. Numerous chronic infarcts.  MRA head - severe Rt and moderate Lt ICA stenoses, severe bilateral VA stenoses, and severe right P2 PCA stenosis.   Carotid Doppler - pending  2D Echo - pending  BLE dopplers - pending  Recommend TEE and loop recorder  LDL - 130  HgbA1c pending  VTE prophylaxis - Lovenox Diet Heart Room service appropriate? Yes; Fluid consistency: Thin Diet NPO time specified Except for: Sips with Meds  No antithrombotic prior to admission, now on aspirin 325 mg daily and clopidogrel 75 mg daily  Patient counseled to be compliant with her antithrombotic medications  Ongoing aggressive stroke risk factor management  Therapy recommendations: CIR recommended. Rehab MD consult on chart.  Disposition: Pending  Hypertension  Stable  Permissive hypertension (OK if < 220/120) but gradually normalize in 5-7 days  Long-term BP goal normotensive  Hyperlipidemia  Home meds: No lipid lowering medications prior to admission.  LDL 130, goal < 70  Start Lipitor 80 mg daily.  Continue statin at discharge  Other Stroke Risk Factors  Advanced age  Hx stroke/TIA - on MRI images  Other [facility] day # 0  Rosalin Hawking, MD PhD Stroke Neurology 04/02/2017 11:04 PM    To contact Stroke Continuity provider, please refer to [email or web address] After hours, contact General Neurology

## 2017-04-02 NOTE — Progress Notes (Signed)
PROGRESS NOTE    [name] [name]  SHF:[id number] DOB: Nov 30, 1945 DOA: 04/01/2017 PCP: Patient, No Pcp Per    Brief Narrative:[name] [name] is a 71 y.o. female who presents with R arm weakness concerning for stroke. Neuro consulted and following. H/o CVA concerning and currently in a state of HTN emergency.   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  Plan for TEE , cardiology consulted and TEE scheduled for tomorrow.  PT eval recommended CIR.  LDL is 130.  Currently on aspirin, plavix and lipitor.    Hypertensive urgency:  Permissive hypertension.    Hypokalemia: repleted.    DVT prophylaxis: (Lovenox/ Code Status: (Full) Family Communication: discussed with daughter at bedside.  Disposition Plan: CIR when stable.    Consultants:   Neurology / cardiology.    Procedures: TEE scheduled for tomorrow.    Antimicrobials: none.    Subjective: No new complaints.   Objective: Vitals:   04/02/17 0603 04/02/17 1010 04/02/17 1348 04/02/17 1752  BP: (!) 180/70 (!) 167/61 136/66 (!) 155/70  Pulse: 67 66 68 72  Resp: 18 20 20 20   Temp: 97.9 F (36.6 C) 98.4 F (36.9 C) 98.7 F (37.1 C) 98.4 F (36.9 C)  TempSrc: Oral Oral Oral Oral  SpO2:  98% 98% 99%  Weight:      Height:        Intake/Output Summary (Last 24 hours) at 04/02/17 1756 Last data filed at 04/02/17 1753  Gross per 24 hour  Intake            515.5 ml  Output                0 ml  Net            515.5 ml   Filed Weights   04/01/17 0910  Weight: 63.5 kg (140 lb)    Examination:  General exam: Appears calm and comfortable  Respiratory system: Clear to auscultation. Respiratory effort normal. Cardiovascular system: S1 & S2 heard, RRR. No  JVD, murmurs, rubs, gallops or clicks. No pedal edema. Gastrointestinal system: Abdomen is nondistended, soft and nontender. No organomegaly or masses felt. Normal bowel sounds heard. Central nervous system: Alert and  Slightly confused, right arm strength 2/5.  Extremities: Symmetric 5 x 5 power. Skin: No rashes, lesions or ulcers     Data Reviewed: I have personally reviewed following labs and imaging studies  CBC:  Recent Labs Lab 04/01/17 0920 04/01/17 0932 04/02/17 0814  WBC 5.6  --  3.6*  NEUTROABS 4.4  --   --   HGB 13.5 13.9 14.8  HCT 42.2 41.0 45.9  MCV 80.7  --  81.2  PLT 161  --  502   Basic Metabolic Panel:  Recent Labs Lab 04/01/17 0920 04/01/17 0932 04/02/17 0814  NA 140 141 140  K 3.2* 3.2* 3.9  CL 106 104 107  CO2 24  --  26  GLUCOSE 119* 117* 93  BUN 9 9 9   CREATININE 0.81 0.80 0.83  CALCIUM 9.1  --  9.0   GFR: Estimated Creatinine Clearance: 65 mL/min (by C-G formula based on SCr of 0.83 mg/dL). Liver Function Tests:  Recent Labs Lab 04/01/17 0920  AST 17  ALT 12*  ALKPHOS 68  BILITOT 0.9  PROT 6.7  ALBUMIN 3.7   No results for input(s): LIPASE, AMYLASE in the last 168 hours. No results for input(s): AMMONIA in the last 168 hours. Coagulation Profile:  Recent Labs Lab 04/01/17 0920  INR 1.08   Cardiac Enzymes:  Recent Labs Lab 04/01/17 1926  TROPONINI <0.03   BNP (last 3 results) No results for input(s): PROBNP in the last 8760 hours. HbA1C: No results for input(s): HGBA1C in the last 72 hours. CBG: No results for input(s): GLUCAP in the last 168 hours. Lipid Profile:  Recent Labs  04/02/17 0814  CHOL 211*  HDL 69  LDLCALC 130*  TRIG 59  CHOLHDL 3.1   Thyroid Function Tests: No results for input(s): TSH, T4TOTAL, FREET4, T3FREE, THYROIDAB in the last 72 hours. Anemia Panel: No results for input(s): VITAMINB12, FOLATE, FERRITIN, TIBC, IRON, RETICCTPCT in the last 72 hours. Sepsis Labs: No results for  input(s): PROCALCITON, LATICACIDVEN in the last 168 hours.  Recent Results (from the past 240 hour(s))  Urine culture     Status: Abnormal   Collection Time: 04/01/17 12:16 PM  Result Value Ref Range Status   Specimen Description URINE, CATHETERIZED  Final   Special Requests Normal  Final   Culture MULTIPLE SPECIES PRESENT, SUGGEST RECOLLECTION (A)  Final   Report Status 04/02/2017 FINAL  Final         Radiology Studies: Ct Head Wo Contrast  Result Date: 04/01/2017 CLINICAL DATA:  Right arm paralysis for several days, initial encounter EXAM: CT HEAD WITHOUT CONTRAST TECHNIQUE: Contiguous axial images were obtained from the base of the skull through the vertex without intravenous contrast. COMPARISON:  None. FINDINGS: Brain: Chronic white matter ischemic change is seen. No findings to suggest acute hemorrhage, acute infarction or space-occupying mass lesion are noted. Prior lacunar infarcts are noted within the internal capsule on the right in the basal ganglia on the left. Vascular: No hyperdense vessel or unexpected calcification. Skull: Normal. Negative for fracture or focal lesion. Sinuses/Orbits: No acute finding. Other: None. IMPRESSION: Chronic white matter ischemic change. Lacunar infarcts are noted. No acute abnormality is seen. Electronically Signed   By: Inez Catalina M.D.   On: 04/01/2017 10:53   Mr Brain Wo Contrast  Result Date: 04/01/2017 CLINICAL DATA:  Right arm weakness and unsteady gait for 2 days. EXAM: MRI HEAD WITHOUT CONTRAST MRA HEAD WITHOUT CONTRAST MRI CERVICAL SPINE WITHOUT CONTRAST TECHNIQUE: Multiplanar, multiecho pulse sequences of the brain and surrounding structures were obtained without intravenous contrast. Angiographic images of the head were obtained using MRA technique without contrast. Multiplanar, multiecho pulse sequences of the cervical spine were obtained without intravenous contrast. COMPARISON:  Head CT 04/01/2017 FINDINGS: MRI HEAD FINDINGS Brain:  There are numerous Weisse patchy acute infarcts predominantly sagittally oriented in the left cerebral hemisphere involving cortex and white matter with greatest involvement of the centrum semiovale suggestive of watershed ischemia. There are a few tiny acute infarcts in a similar distribution in the right cerebral hemisphere predominantly in the frontal and parietal lobes near the vertex. A 6 mm acute infarct is present in the genu of the corpus callosum. A chronic microhemorrhage is noted in the right thalamus. Patchy to confluent T2 hyperintensities in the cerebral white matter bilaterally are nonspecific but compatible with moderate chronic Derousse vessel ischemia, advanced for age. Chronic lacunar infarcts are present in the cerebellum, bilateral basal ganglia, right internal capsule, and corpus callosum and deep cerebral white matter bilaterally. There is mild cerebral and moderate cerebellar atrophy.  No mass, midline shift, or extra-axial fluid collection is seen. Mild to moderate chronic Henard vessel ischemic changes are present in the pons with a tiny chronic infarct noted. The pituitary gland is mildly prominent in size for age, measuring 8 mm in height. Vascular: Major intracranial vascular flow voids are preserved. Skull and upper cervical spine: Unremarkable bone marrow signal. Sinuses/Orbits: Unremarkable orbits. Paranasal sinuses and mastoid air cells are clear. Other: None. MRA HEAD FINDINGS The visualized distal vertebral arteries are patent to the basilar with the left being slightly larger than the right. There is diffuse V4 segment atherosclerotic irregularity with severe stenoses bilaterally, greater on the left. AICAs appear patent proximally with a severe proximal stenosis on the right. SCA origins are patent bilaterally. The basilar artery is patent with mild irregularity but no significant stenosis. Posterior communicating arteries are not identified. PCAs are patent with a severe proximal P2  stenosis on the right and moderate to prominent branch vessel irregularity and narrowing bilaterally. The internal carotid arteries are patent from skullbase to carotid termini with advanced siphon atherosclerosis resulting in severe tandem cavernous stenoses on the right and moderate stenosis on the left. 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 The left A1 segment is patent with mild irregularity proximally but no significant stenosis and supplies the right ACA via the anterior communicating artery. There are severe A2 stenoses bilaterally. The MCAs are patent without evidence of significant M1 stenosis or proximal branch occlusion. There is moderate MCA branch vessel atherosclerotic irregularity and narrowing bilaterally. No aneurysm is identified. MRI CERVICAL SPINE FINDINGS The study is mildly motion degraded. Alignment: Normal. Vertebrae: No evidence of fracture, osseous lesion, or significant marrow edema. Cord: Normal signal and morphology. Posterior Fossa, vertebral arteries, paraspinal tissues: Cerebral atrophy and infarcts as described above. Preserved vertebral artery flow voids in the neck. Disc levels: C2-3:  Negative. C3-4: Disc bulging and uncovertebral spurring result in mild right neural foraminal stenosis without spinal stenosis. C4-5: Minimal disc bulging and uncovertebral spurring without significant stenosis. C5-6:  Minimal disc bulging without stenosis. C6-7: Demary central disc extrusion with slight caudal migration without stenosis or significant spinal cord mass effect. C7-T1: Salvino right paracentral disc protrusion without significant stenosis. IMPRESSION: 1. Patchy acute infarcts in the left greater than right cerebral hemispheres with distribution suggesting watershed ischemia. 2. Age advanced chronic Easterday vessel ischemic disease with numerous chronic infarcts as above. 3. Mild prominence of the pituitary gland for age. Consider laboratory correlation. 4.  Advanced intracranial atherosclerosis as above including severe right and moderate left ICA stenoses, severe bilateral vertebral artery stenoses, and severe right P2 PCA stenosis. 5. Mild cervical disc degeneration without high-grade stenosis as above. Electronically Signed   By: Logan Bores M.D.   On: 04/01/2017 17:15   Mr Chest W Wo Contrast  Result Date: 04/02/2017 CLINICAL DATA:  Right arm weakness and unsteady gait for 2 days. EXAM: MRI BRACHIAL PLEXUS WITHOUT CONTRAST TECHNIQUE: Multiplanar, multiecho pulse sequences of the neck and surrounding structures were obtained without intravenous contrast. The field of view was focused on the right brachial plexus from the neural foramina to the axilla. CONTRAST:  27mL MULTIHANCE GADOBENATE DIMEGLUMINE 529 MG/ML IV SOLN COMPARISON:  None. FINDINGS: Despite efforts by the technologist and patient, motion artifact is present on today's exam and could not be eliminated. This reduces exam sensitivity and specificity. Spinal cord No specific abnormality were visualized; todays exam was not a dedicated study of the cervical or thoracic spine. Brachial plexus: No  appreciable abnormal edema, mass effect, or other specific abnormality along the components of the right brachial plexus. 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 Other findings No supplemental non-categorized findings. IMPRESSION: 1. No significant abnormality of the brachial plexus is identified. 2. Cervical and thoracic spondylosis. 3. 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 Electronically Signed   By: Van Clines M.D.   On: 04/02/2017 07:10   Mr Cervical Spine Wo Contrast  Result Date: 04/01/2017 CLINICAL DATA:  Right arm weakness and unsteady gait for 2 days. EXAM: MRI HEAD WITHOUT CONTRAST MRA HEAD WITHOUT CONTRAST MRI CERVICAL SPINE WITHOUT CONTRAST TECHNIQUE:  Multiplanar, multiecho pulse sequences of the brain and surrounding structures were obtained without intravenous contrast. Angiographic images of the head were obtained using MRA technique without contrast. Multiplanar, multiecho pulse sequences of the cervical spine were obtained without intravenous contrast. COMPARISON:  Head CT 04/01/2017 FINDINGS: MRI HEAD FINDINGS Brain: There are numerous Shook patchy acute infarcts predominantly sagittally oriented in the left cerebral hemisphere involving cortex and white matter with greatest involvement of the centrum semiovale suggestive of watershed ischemia. There are a few tiny acute infarcts in a similar distribution in the right cerebral hemisphere predominantly in the frontal and parietal lobes near the vertex. A 6 mm acute infarct is present in the genu of the corpus callosum. A chronic microhemorrhage is noted in the right thalamus. Patchy to confluent T2 hyperintensities in the cerebral white matter bilaterally are nonspecific but compatible with moderate chronic Sheeley vessel ischemia, advanced for age. Chronic lacunar infarcts are present in the cerebellum, bilateral basal ganglia, right internal capsule, and corpus callosum and deep cerebral white matter bilaterally. There is mild cerebral and moderate cerebellar atrophy. No mass, midline shift, or extra-axial fluid collection is seen. Mild to moderate chronic Reeder vessel ischemic changes are present in the pons with a tiny chronic infarct noted. The pituitary gland is mildly prominent in size for age, measuring 8 mm in height. Vascular: Major intracranial vascular flow voids are preserved. Skull and upper cervical spine: Unremarkable bone marrow signal. Sinuses/Orbits: Unremarkable orbits. Paranasal sinuses and mastoid air cells are clear. Other: None. MRA HEAD FINDINGS The visualized distal vertebral arteries are patent to the basilar with the left being slightly larger than the right. There is diffuse V4  segment atherosclerotic irregularity with severe stenoses bilaterally, greater on the left. AICAs appear patent proximally with a severe proximal stenosis on the right. SCA origins are patent bilaterally. The basilar artery is patent with mild irregularity but no significant stenosis. Posterior communicating arteries are not identified. PCAs are patent with a severe proximal P2 stenosis on the right and moderate to prominent branch vessel irregularity and narrowing bilaterally. The internal carotid arteries are patent from skullbase to carotid termini with advanced siphon atherosclerosis resulting in severe tandem cavernous stenoses on the right and moderate stenosis on the left. 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 The left A1 segment is patent with mild irregularity proximally but no significant stenosis and supplies the right ACA via the anterior communicating artery. There are severe A2 stenoses bilaterally. The MCAs are patent without evidence of significant M1 stenosis or proximal branch occlusion. There is moderate MCA branch vessel atherosclerotic irregularity and narrowing bilaterally. No aneurysm is identified. MRI CERVICAL SPINE FINDINGS The study is mildly motion degraded. Alignment: Normal. Vertebrae: No evidence of fracture, osseous lesion, or significant marrow edema. Cord: Normal signal  and morphology. Posterior Fossa, vertebral arteries, paraspinal tissues: Cerebral atrophy and infarcts as described above. Preserved vertebral artery flow voids in the neck. Disc levels: C2-3:  Negative. C3-4: Disc bulging and uncovertebral spurring result in mild right neural foraminal stenosis without spinal stenosis. C4-5: Minimal disc bulging and uncovertebral spurring without significant stenosis. C5-6:  Minimal disc bulging without stenosis. C6-7: Vellucci central disc extrusion with slight caudal migration without stenosis or significant spinal cord mass effect. C7-T1: Maietta right  paracentral disc protrusion without significant stenosis. IMPRESSION: 1. Patchy acute infarcts in the left greater than right cerebral hemispheres with distribution suggesting watershed ischemia. 2. Age advanced chronic Vanpelt vessel ischemic disease with numerous chronic infarcts as above. 3. Mild prominence of the pituitary gland for age. Consider laboratory correlation. 4. Advanced intracranial atherosclerosis as above including severe right and moderate left ICA stenoses, severe bilateral vertebral artery stenoses, and severe right P2 PCA stenosis. 5. Mild cervical disc degeneration without high-grade stenosis as above. Electronically Signed   By: Logan Bores M.D.   On: 04/01/2017 17:15   Mr [name] Head/brain GG Cm  Result Date: 04/01/2017 CLINICAL DATA:  Right arm weakness and unsteady gait for 2 days. EXAM: MRI HEAD WITHOUT CONTRAST MRA HEAD WITHOUT CONTRAST MRI CERVICAL SPINE WITHOUT CONTRAST TECHNIQUE: Multiplanar, multiecho pulse sequences of the brain and surrounding structures were obtained without intravenous contrast. Angiographic images of the head were obtained using MRA technique without contrast. Multiplanar, multiecho pulse sequences of the cervical spine were obtained without intravenous contrast. COMPARISON:  Head CT 04/01/2017 FINDINGS: MRI HEAD FINDINGS Brain: There are numerous Shimamoto patchy acute infarcts predominantly sagittally oriented in the left cerebral hemisphere involving cortex and white matter with greatest involvement of the centrum semiovale suggestive of watershed ischemia. There are a few tiny acute infarcts in a similar distribution in the right cerebral hemisphere predominantly in the frontal and parietal lobes near the vertex. A 6 mm acute infarct is present in the genu of the corpus callosum. A chronic microhemorrhage is noted in the right thalamus. Patchy to confluent T2 hyperintensities in the cerebral white matter bilaterally are nonspecific but compatible with moderate  chronic Dethloff vessel ischemia, advanced for age. Chronic lacunar infarcts are present in the cerebellum, bilateral basal ganglia, right internal capsule, and corpus callosum and deep cerebral white matter bilaterally. There is mild cerebral and moderate cerebellar atrophy. No mass, midline shift, or extra-axial fluid collection is seen. Mild to moderate chronic Ketner vessel ischemic changes are present in the pons with a tiny chronic infarct noted. The pituitary gland is mildly prominent in size for age, measuring 8 mm in height. Vascular: Major intracranial vascular flow voids are preserved. Skull and upper cervical spine: Unremarkable bone marrow signal. Sinuses/Orbits: Unremarkable orbits. Paranasal sinuses and mastoid air cells are clear. Other: None. MRA HEAD FINDINGS The visualized distal vertebral arteries are patent to the basilar with the left being slightly larger than the right. There is diffuse V4 segment atherosclerotic irregularity with severe stenoses bilaterally, greater on the left. AICAs appear patent proximally with a severe proximal stenosis on the right. SCA origins are patent bilaterally. The basilar artery is patent with mild irregularity but no significant stenosis. Posterior communicating arteries are not identified. PCAs are patent with a severe proximal P2 stenosis on the right and moderate to prominent branch vessel irregularity and narrowing bilaterally. The internal carotid arteries are patent from skullbase to carotid termini with advanced siphon atherosclerosis resulting in severe tandem cavernous stenoses on the right and moderate stenosis  on the left. 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 The left A1 segment is patent with mild irregularity proximally but no significant stenosis and supplies the right ACA via the anterior communicating artery. There are severe A2 stenoses bilaterally. The MCAs are patent without evidence of significant M1 stenosis or  proximal branch occlusion. There is moderate MCA branch vessel atherosclerotic irregularity and narrowing bilaterally. No aneurysm is identified. MRI CERVICAL SPINE FINDINGS The study is mildly motion degraded. Alignment: Normal. Vertebrae: No evidence of fracture, osseous lesion, or significant marrow edema. Cord: Normal signal and morphology. Posterior Fossa, vertebral arteries, paraspinal tissues: Cerebral atrophy and infarcts as described above. Preserved vertebral artery flow voids in the neck. Disc levels: C2-3:  Negative. C3-4: Disc bulging and uncovertebral spurring result in mild right neural foraminal stenosis without spinal stenosis. C4-5: Minimal disc bulging and uncovertebral spurring without significant stenosis. C5-6:  Minimal disc bulging without stenosis. C6-7: Woodson central disc extrusion with slight caudal migration without stenosis or significant spinal cord mass effect. C7-T1: Mcartor right paracentral disc protrusion without significant stenosis. IMPRESSION: 1. Patchy acute infarcts in the left greater than right cerebral hemispheres with distribution suggesting watershed ischemia. 2. Age advanced chronic Student vessel ischemic disease with numerous chronic infarcts as above. 3. Mild prominence of the pituitary gland for age. Consider laboratory correlation. 4. Advanced intracranial atherosclerosis as above including severe right and moderate left ICA stenoses, severe bilateral vertebral artery stenoses, and severe right P2 PCA stenosis. 5. Mild cervical disc degeneration without high-grade stenosis as above. Electronically Signed   By: Logan Bores M.D.   On: 04/01/2017 17:15        Scheduled Meds: . aspirin  325 mg Oral Daily  . atorvastatin  80 mg Oral q1800  . clopidogrel  75 mg Oral Daily  . enoxaparin (LOVENOX) injection  40 mg Subcutaneous Q24H  . sodium chloride flush  3 mL Intravenous Q12H   Continuous Infusions: . sodium chloride    . sodium chloride 10 mL/hr at 04/01/17  1435     LOS: 0 days    Time spent: 35 minutes.     Hosie Poisson, MD [facility] Pager [phone]  If 7PM-7AM, please contact night-coverage [email or web address] Password [facility] 04/02/2017, 5:56 PM

## 2017-04-02 NOTE — Consult Note (Signed)
Physical Medicine and Rehabilitation Consult Reason for Consult: Right side weakness with gait disorder Referring Physician: [facility]   HPI: [name] [name] is a 71 y.o. right handed female on no prescription medications. Per chart review patient independent prior to admission living alone. She does drive. One level home with 4 steps to entry. Family in area question assistance on discharge. Presented 04/01/2017 with right-sided weakness and unsteady gait 2 days. CT/MRI showed patchy acute infarcts in the left greater than right cerebral hemispheres with distribution suggesting watershed ischemia. Advanced Pincus vessel ischemic changes. MRA with atherosclerotic type changes including severe right and moderate left ICA stenosis, severe bilateral vertebral artery stenosis. MRI cervical spine with mild cervical disc degeneration. MRI of the chest negative. Patient did not receive TPA. Echocardiogram pending. Neurology consulted presently on aspirin for CVA prophylaxis. Subcutaneous Lovenox for DVT prophylaxis. Tolerating a regular diet.  Daughter at bedside,  Review of Systems  Constitutional: Negative for chills and fever.  HENT: Negative for hearing loss.   Eyes: Negative for blurred vision and double vision.  Respiratory: Negative for cough and shortness of breath.   Cardiovascular: Negative for chest pain, palpitations and leg swelling.  Gastrointestinal: Positive for constipation. Negative for nausea and vomiting.  Genitourinary: Negative for dysuria, flank pain and hematuria.  Musculoskeletal: Positive for joint pain and myalgias.  Skin: Negative for rash.  Neurological: Positive for weakness. Negative for seizures.  All other systems reviewed and are negative.  Past Medical History:  Diagnosis Date  . Stroke [facility])    History reviewed. No pertinent surgical history. Family History  Problem Relation Age of Onset  . Cancer Mother    Social History:  reports that she has never  smoked. She has never used smokeless tobacco. She reports that she does not drink alcohol or use drugs. Allergies: No Known Allergies Medications Prior to Admission  Medication Sig Dispense Refill  . Multiple Vitamins-Minerals (MULTIVITAMIN WITH MINERALS) tablet Take 1 tablet by mouth daily.    [name] (EYE DROPS ALLERGY RELIEF OP) Place 2 drops into both eyes 2 (two) times daily.      Home: Home Living Family/patient expects to be discharged to:: Private residence Living Arrangements: Alone Available Help at Discharge: Family, Available PRN/intermittently Type of Home: House Home Access: Stairs to enter Technical brewer of Steps: 4 Entrance Stairs-Rails: None Home Layout: One level Home Equipment: None  Functional History: Prior Function Level of Independence: Independent Comments: Drives Functional Status:  Mobility: Bed Mobility Overal bed mobility: Needs Assistance Bed Mobility: Supine to Sit, Sit to Supine Supine to sit: Mod assist, HOB elevated Sit to supine: Mod assist, HOB elevated General bed mobility comments: Verbal cues to move to sitting position.  Patient able to assist with moving RLE off of bed.  Required assist to manage RUE.  Assist to scoot hips to EOB in sitting.  Increased effort required for transition.  Patient able to maintain sitting balance with min guard assist.  However, patient with Rt shoulder/trunk was retracted, with Lt side more forward.  Patient unaware of sitting posture.  Verbal and tactile cues to move to more symmetrical position.  Required assist to control trunk and bring LE's onto bed to return to supine. Transfers Overall transfer level: Needs assistance Equipment used: 1 person hand held assist Transfers: Sit to/from Stand Sit to Stand: Min assist General transfer comment: Verbal cues for placement of LUE.  Patient able to move to standing with min assist to rise to standing  and for balance.  Worked with  patient on posture, bringing Rt side forward.  Support provided to RUE.  Patient able to step forward/back with LLE - noted Rt knee instability.  Then attempted to step forward/back with RLE.  Difficulty advancing RLE and moving forward over RLE for weight bearing.  RLE fatigued quickly. Ambulation/Gait General Gait Details: NT    ADL:    Cognition: Cognition Overall Cognitive Status: Impaired/Different from baseline Orientation Level: Oriented to person, Oriented to place, Oriented to time, Oriented to situation Cognition Arousal/Alertness: Awake/alert Behavior During Therapy: WFL for tasks assessed/performed Overall Cognitive Status: Impaired/Different from baseline Area of Impairment: Orientation, Following commands, Problem solving Orientation Level: Disoriented to, Place Following Commands: Follows one step commands with increased time Problem Solving: Difficulty sequencing, Requires verbal cues General Comments: Difficult at times to tell if patient doesn't know the answer or is having trouble saying the word.    Blood pressure (!) 180/70, pulse 67, temperature 97.9 F (36.6 C), temperature source Oral, resp. rate 18, height 5\' 8"  (1.727 m), weight 63.5 kg (140 lb), SpO2 99 %. Physical Exam  Vitals reviewed. Constitutional: She is oriented to person, place, and time.  HENT:  Head: Normocephalic.  Eyes: EOM are normal.  Neck: Normal range of motion. Neck supple. No tracheal deviation present. No thyromegaly present.  Cardiovascular: Normal rate, regular rhythm and normal heart sounds.   Respiratory: Effort normal and breath sounds normal. No respiratory distress. She has no wheezes.  GI: Soft. Bowel sounds are normal. She exhibits no distension.  Neurological: She is alert and oriented to person, place, and time.  Flat affect.Follows commands   Skin: Skin is warm and dry.  Motor strength is 0/5 in the right deltoid, biceps, triceps, grip 4 minus at the right hip flexor,  knee extensor, ankle dorsal flexor 5/5 left deltoid by successive grip as well as hip flexor, knee extensor, ankle dorsiflexor. Delayed responses to questions. Mood and affect are flat  Results for orders placed or performed during the hospital encounter of 04/01/17 (from the past 24 hour(s))  Ethanol     Status: None   Collection Time: 04/01/17  9:20 AM  Result Value Ref Range   Alcohol, Ethyl (B) <5 <5 mg/dL  Protime-INR     Status: None   Collection Time: 04/01/17  9:20 AM  Result Value Ref Range   Prothrombin Time 14.1 11.4 - 15.2 seconds   INR 1.08   APTT     Status: None   Collection Time: 04/01/17  9:20 AM  Result Value Ref Range   aPTT 33 24 - 36 seconds  CBC     Status: Abnormal   Collection Time: 04/01/17  9:20 AM  Result Value Ref Range   WBC 5.6 4.0 - 10.5 K/uL   RBC 5.23 (H) 3.87 - 5.11 MIL/uL   Hemoglobin 13.5 12.0 - 15.0 g/dL   HCT 42.2 36.0 - 46.0 %   MCV 80.7 78.0 - 100.0 fL   MCH 25.8 (L) 26.0 - 34.0 pg   MCHC 32.0 30.0 - 36.0 g/dL   RDW 14.0 11.5 - 15.5 %   Platelets 161 150 - 400 K/uL  Differential     Status: None   Collection Time: 04/01/17  9:20 AM  Result Value Ref Range   Neutrophils Relative % 79 %   Neutro Abs 4.4 1.7 - 7.7 K/uL   Lymphocytes Relative 14 %   Lymphs Abs 0.8 0.7 - 4.0 K/uL   Monocytes Relative 5 %  Monocytes Absolute 0.3 0.1 - 1.0 K/uL   Eosinophils Relative 1 %   Eosinophils Absolute 0.1 0.0 - 0.7 K/uL   Basophils Relative 1 %   Basophils Absolute 0.0 0.0 - 0.1 K/uL  Comprehensive metabolic panel     Status: Abnormal   Collection Time: 04/01/17  9:20 AM  Result Value Ref Range   Sodium 140 135 - 145 mmol/L   Potassium 3.2 (L) 3.5 - 5.1 mmol/L   Chloride 106 101 - 111 mmol/L   CO2 24 22 - 32 mmol/L   Glucose, Bld 119 (H) 65 - 99 mg/dL   BUN 9 6 - 20 mg/dL   Creatinine, Ser 0.81 0.44 - 1.00 mg/dL   Calcium 9.1 8.9 - 10.3 mg/dL   Total Protein 6.7 6.5 - 8.1 g/dL   Albumin 3.7 3.5 - 5.0 g/dL   AST 17 15 - 41 U/L   ALT  12 (L) 14 - 54 U/L   Alkaline Phosphatase 68 38 - 126 U/L   Total Bilirubin 0.9 0.3 - 1.2 mg/dL   GFR calc non Af Amer >60 >60 mL/min   GFR calc Af Amer >60 >60 mL/min   Anion gap 10 5 - 15  Urine rapid drug screen (hosp performed)not at [facility]     Status: None   Collection Time: 04/01/17  9:20 AM  Result Value Ref Range   Opiates NONE DETECTED NONE DETECTED   Cocaine NONE DETECTED NONE DETECTED   Benzodiazepines NONE DETECTED NONE DETECTED   Amphetamines NONE DETECTED NONE DETECTED   Tetrahydrocannabinol NONE DETECTED NONE DETECTED   Barbiturates NONE DETECTED NONE DETECTED  Urinalysis, Routine w reflex microscopic     Status: Abnormal   Collection Time: 04/01/17  9:20 AM  Result Value Ref Range   Color, Urine AMBER (A) YELLOW   APPearance CLOUDY (A) CLEAR   Specific Gravity, Urine 1.021 1.005 - 1.030   pH 5.0 5.0 - 8.0   Glucose, UA NEGATIVE NEGATIVE mg/dL   Hgb urine dipstick MODERATE (A) NEGATIVE   Bilirubin Urine NEGATIVE NEGATIVE   Ketones, ur 80 (A) NEGATIVE mg/dL   Protein, ur NEGATIVE NEGATIVE mg/dL   Nitrite NEGATIVE NEGATIVE   Leukocytes, UA LARGE (A) NEGATIVE   RBC / HPF 0-5 0 - 5 RBC/hpf   WBC, UA TOO NUMEROUS TO COUNT 0 - 5 WBC/hpf   Bacteria, UA MANY (A) NONE SEEN   Squamous Epithelial / LPF 0-5 (A) NONE SEEN   Mucous PRESENT    Non Squamous Epithelial 0-5 (A) NONE SEEN  I-stat troponin, ED (not at [facility], [facility])     Status: None   Collection Time: 04/01/17  9:30 AM  Result Value Ref Range   Troponin i, poc 0.00 0.00 - 0.08 ng/mL   Comment 3          I-Stat Chem 8, ED  (not at [facility], [facility])     Status: Abnormal   Collection Time: 04/01/17  9:32 AM  Result Value Ref Range   Sodium 141 135 - 145 mmol/L   Potassium 3.2 (L) 3.5 - 5.1 mmol/L   Chloride 104 101 - 111 mmol/L   BUN 9 6 - 20 mg/dL   Creatinine, Ser 0.80 0.44 - 1.00 mg/dL   Glucose, Bld 117 (H) 65 - 99 mg/dL   Calcium, Ion 1.09 (L) 1.15 - 1.40 mmol/L   TCO2 25 0 - 100 mmol/L   Hemoglobin 13.9 12.0 -  15.0 g/dL   HCT 41.0 36.0 - 46.0 %  Urine culture     Status: Abnormal   Collection Time: 04/01/17 12:16 PM  Result Value Ref Range   Specimen Description URINE, CATHETERIZED    Special Requests Normal    Culture MULTIPLE SPECIES PRESENT, SUGGEST RECOLLECTION (A)    Report Status 04/02/2017 FINAL   Troponin I     Status: None   Collection Time: 04/01/17  7:26 PM  Result Value Ref Range   Troponin I <0.03 <0.03 ng/mL  CBC     Status: Abnormal   Collection Time: 04/02/17  8:14 AM  Result Value Ref Range   WBC 3.6 (L) 4.0 - 10.5 K/uL   RBC 5.65 (H) 3.87 - 5.11 MIL/uL   Hemoglobin 14.8 12.0 - 15.0 g/dL   HCT 45.9 36.0 - 46.0 %   MCV 81.2 78.0 - 100.0 fL   MCH 26.2 26.0 - 34.0 pg   MCHC 32.2 30.0 - 36.0 g/dL   RDW 14.2 11.5 - 15.5 %   Platelets 172 150 - 400 K/uL   Ct Head Wo Contrast  Result Date: 04/01/2017 CLINICAL DATA:  Right arm paralysis for several days, initial encounter EXAM: CT HEAD WITHOUT CONTRAST TECHNIQUE: Contiguous axial images were obtained from the base of the skull through the vertex without intravenous contrast. COMPARISON:  None. FINDINGS: Brain: Chronic white matter ischemic change is seen. No findings to suggest acute hemorrhage, acute infarction or space-occupying mass lesion are noted. Prior lacunar infarcts are noted within the internal capsule on the right in the basal ganglia on the left. Vascular: No hyperdense vessel or unexpected calcification. Skull: Normal. Negative for fracture or focal lesion. Sinuses/Orbits: No acute finding. Other: None. IMPRESSION: Chronic white matter ischemic change. Lacunar infarcts are noted. No acute abnormality is seen. Electronically Signed   By: Inez Catalina M.D.   On: 04/01/2017 10:53   Mr Brain Wo Contrast  Result Date: 04/01/2017 CLINICAL DATA:  Right arm weakness and unsteady gait for 2 days. EXAM: MRI HEAD WITHOUT CONTRAST MRA HEAD WITHOUT CONTRAST MRI CERVICAL SPINE WITHOUT CONTRAST TECHNIQUE: Multiplanar, multiecho  pulse sequences of the brain and surrounding structures were obtained without intravenous contrast. Angiographic images of the head were obtained using MRA technique without contrast. Multiplanar, multiecho pulse sequences of the cervical spine were obtained without intravenous contrast. COMPARISON:  Head CT 04/01/2017 FINDINGS: MRI HEAD FINDINGS Brain: There are numerous Griffiths patchy acute infarcts predominantly sagittally oriented in the left cerebral hemisphere involving cortex and white matter with greatest involvement of the centrum semiovale suggestive of watershed ischemia. There are a few tiny acute infarcts in a similar distribution in the right cerebral hemisphere predominantly in the frontal and parietal lobes near the vertex. A 6 mm acute infarct is present in the genu of the corpus callosum. A chronic microhemorrhage is noted in the right thalamus. Patchy to confluent T2 hyperintensities in the cerebral white matter bilaterally are nonspecific but compatible with moderate chronic Massar vessel ischemia, advanced for age. Chronic lacunar infarcts are present in the cerebellum, bilateral basal ganglia, right internal capsule, and corpus callosum and deep cerebral white matter bilaterally. There is mild cerebral and moderate cerebellar atrophy. No mass, midline shift, or extra-axial fluid collection is seen. Mild to moderate chronic Kabir vessel ischemic changes are present in the pons with a tiny chronic infarct noted. The pituitary gland is mildly prominent in size for age, measuring 8 mm in height. Vascular: Major intracranial vascular flow voids are preserved. Skull and upper cervical spine: Unremarkable bone marrow signal. Sinuses/Orbits: Unremarkable orbits.  Paranasal sinuses and mastoid air cells are clear. Other: None. MRA HEAD FINDINGS The visualized distal vertebral arteries are patent to the basilar with the left being slightly larger than the right. There is diffuse V4 segment atherosclerotic  irregularity with severe stenoses bilaterally, greater on the left. AICAs appear patent proximally with a severe proximal stenosis on the right. SCA origins are patent bilaterally. The basilar artery is patent with mild irregularity but no significant stenosis. Posterior communicating arteries are not identified. PCAs are patent with a severe proximal P2 stenosis on the right and moderate to prominent branch vessel irregularity and narrowing bilaterally. The internal carotid arteries are patent from skullbase to carotid termini with advanced siphon atherosclerosis resulting in severe tandem cavernous stenoses on the right and moderate stenosis on the left. 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 The left A1 segment is patent with mild irregularity proximally but no significant stenosis and supplies the right ACA via the anterior communicating artery. There are severe A2 stenoses bilaterally. The MCAs are patent without evidence of significant M1 stenosis or proximal branch occlusion. There is moderate MCA branch vessel atherosclerotic irregularity and narrowing bilaterally. No aneurysm is identified. MRI CERVICAL SPINE FINDINGS The study is mildly motion degraded. Alignment: Normal. Vertebrae: No evidence of fracture, osseous lesion, or significant marrow edema. Cord: Normal signal and morphology. Posterior Fossa, vertebral arteries, paraspinal tissues: Cerebral atrophy and infarcts as described above. Preserved vertebral artery flow voids in the neck. Disc levels: C2-3:  Negative. C3-4: Disc bulging and uncovertebral spurring result in mild right neural foraminal stenosis without spinal stenosis. C4-5: Minimal disc bulging and uncovertebral spurring without significant stenosis. C5-6:  Minimal disc bulging without stenosis. C6-7: Klapper central disc extrusion with slight caudal migration without stenosis or significant spinal cord mass effect. C7-T1: Rodier right paracentral disc  protrusion without significant stenosis. IMPRESSION: 1. Patchy acute infarcts in the left greater than right cerebral hemispheres with distribution suggesting watershed ischemia. 2. 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 3. Mild prominence of the pituitary gland for age. Consider laboratory correlation. 4. Advanced intracranial atherosclerosis as above including severe right and moderate left ICA stenoses, severe bilateral vertebral artery stenoses, and severe right P2 PCA stenosis. 5. Mild cervical disc degeneration without high-grade stenosis as above. Electronically Signed   By: Logan Bores M.D.   On: 04/01/2017 17:15   Mr Chest W Wo Contrast  Result Date: 04/02/2017 CLINICAL DATA:  Right arm weakness and unsteady gait for 2 days. EXAM: MRI BRACHIAL PLEXUS WITHOUT CONTRAST TECHNIQUE: Multiplanar, multiecho pulse sequences of the neck and surrounding structures were obtained without intravenous contrast. The field of view was focused on the right brachial plexus from the neural foramina to the axilla. CONTRAST:  17mL MULTIHANCE GADOBENATE DIMEGLUMINE 529 MG/ML IV SOLN COMPARISON:  None. FINDINGS: Despite efforts by the technologist and patient, motion artifact is present on today's exam and could not be eliminated. This reduces exam sensitivity and specificity. Spinal cord No specific abnormality were visualized; todays exam was not a dedicated study of the cervical or thoracic spine. Brachial plexus: No appreciable abnormal edema, mass effect, or other specific abnormality along the components of the right brachial plexus. 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 Other findings No supplemental non-categorized findings. IMPRESSION: 1. No significant abnormality of the brachial plexus is identified. 2. Cervical and thoracic spondylosis. 3. Moderate  degenerative AC  joint arthropathy on the right. Electronically Signed   By: Van Clines M.D.   On: 04/02/2017 07:10   Mr Cervical Spine Wo Contrast  Result Date: 04/01/2017 CLINICAL DATA:  Right arm weakness and unsteady gait for 2 days. EXAM: MRI HEAD WITHOUT CONTRAST MRA HEAD WITHOUT CONTRAST MRI CERVICAL SPINE WITHOUT CONTRAST TECHNIQUE: Multiplanar, multiecho pulse sequences of the brain and surrounding structures were obtained without intravenous contrast. Angiographic images of the head were obtained using MRA technique without contrast. Multiplanar, multiecho pulse sequences of the cervical spine were obtained without intravenous contrast. COMPARISON:  Head CT 04/01/2017 FINDINGS: MRI HEAD FINDINGS Brain: There are numerous Scheidegger patchy acute infarcts predominantly sagittally oriented in the left cerebral hemisphere involving cortex and white matter with greatest involvement of the centrum semiovale suggestive of watershed ischemia. There are a few tiny acute infarcts in a similar distribution in the right cerebral hemisphere predominantly in the frontal and parietal lobes near the vertex. A 6 mm acute infarct is present in the genu of the corpus callosum. A chronic microhemorrhage is noted in the right thalamus. Patchy to confluent T2 hyperintensities in the cerebral white matter bilaterally are nonspecific but compatible with moderate chronic Ryback vessel ischemia, advanced for age. Chronic lacunar infarcts are present in the cerebellum, bilateral basal ganglia, right internal capsule, and corpus callosum and deep cerebral white matter bilaterally. There is mild cerebral and moderate cerebellar atrophy. No mass, midline shift, or extra-axial fluid collection is seen. Mild to moderate chronic Teal vessel ischemic changes are present in the pons with a tiny chronic infarct noted. The pituitary gland is mildly prominent in size for age, measuring 8 mm in height. Vascular: Major intracranial  vascular flow voids are preserved. Skull and upper cervical spine: Unremarkable bone marrow signal. Sinuses/Orbits: Unremarkable orbits. Paranasal sinuses and mastoid air cells are clear. Other: None. MRA HEAD FINDINGS The visualized distal vertebral arteries are patent to the basilar with the left being slightly larger than the right. There is diffuse V4 segment atherosclerotic irregularity with severe stenoses bilaterally, greater on the left. AICAs appear patent proximally with a severe proximal stenosis on the right. SCA origins are patent bilaterally. The basilar artery is patent with mild irregularity but no significant stenosis. Posterior communicating arteries are not identified. PCAs are patent with a severe proximal P2 stenosis on the right and moderate to prominent branch vessel irregularity and narrowing bilaterally. The internal carotid arteries are patent from skullbase to carotid termini with advanced siphon atherosclerosis resulting in severe tandem cavernous stenoses on the right and moderate stenosis on the left. 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 The left A1 segment is patent with mild irregularity proximally but no significant stenosis and supplies the right ACA via the anterior communicating artery. There are severe A2 stenoses bilaterally. The MCAs are patent without evidence of significant M1 stenosis or proximal branch occlusion. There is moderate MCA branch vessel atherosclerotic irregularity and narrowing bilaterally. No aneurysm is identified. MRI CERVICAL SPINE FINDINGS The study is mildly motion degraded. Alignment: Normal. Vertebrae: No evidence of fracture, osseous lesion, or significant marrow edema. Cord: Normal signal and morphology. Posterior Fossa, vertebral arteries, paraspinal tissues: Cerebral atrophy and infarcts as described above. Preserved vertebral artery flow voids in the neck. Disc levels: C2-3:  Negative. C3-4: Disc bulging and  uncovertebral spurring result in mild right neural foraminal stenosis without spinal stenosis. C4-5: Minimal disc bulging and uncovertebral spurring without significant stenosis. C5-6:  Minimal disc bulging without stenosis. C6-7: Dedman  central disc extrusion with slight caudal migration without stenosis or significant spinal cord mass effect. C7-T1: Schrupp right paracentral disc protrusion without significant stenosis. IMPRESSION: 1. Patchy acute infarcts in the left greater than right cerebral hemispheres with distribution suggesting watershed ischemia. 2. Age advanced chronic Styer vessel ischemic disease with numerous chronic infarcts as above. 3. Mild prominence of the pituitary gland for age. Consider laboratory correlation. 4. Advanced intracranial atherosclerosis as above including severe right and moderate left ICA stenoses, severe bilateral vertebral artery stenoses, and severe right P2 PCA stenosis. 5. Mild cervical disc degeneration without high-grade stenosis as above. Electronically Signed   By: Logan Bores M.D.   On: 04/01/2017 17:15   Mr [name] Head/brain FH Cm  Result Date: 04/01/2017 CLINICAL DATA:  Right arm weakness and unsteady gait for 2 days. EXAM: MRI HEAD WITHOUT CONTRAST MRA HEAD WITHOUT CONTRAST MRI CERVICAL SPINE WITHOUT CONTRAST TECHNIQUE: Multiplanar, multiecho pulse sequences of the brain and surrounding structures were obtained without intravenous contrast. Angiographic images of the head were obtained using MRA technique without contrast. Multiplanar, multiecho pulse sequences of the cervical spine were obtained without intravenous contrast. COMPARISON:  Head CT 04/01/2017 FINDINGS: MRI HEAD FINDINGS Brain: There are numerous Homan patchy acute infarcts predominantly sagittally oriented in the left cerebral hemisphere involving cortex and white matter with greatest involvement of the centrum semiovale suggestive of watershed ischemia. There are a few tiny acute infarcts in a similar  distribution in the right cerebral hemisphere predominantly in the frontal and parietal lobes near the vertex. A 6 mm acute infarct is present in the genu of the corpus callosum. A chronic microhemorrhage is noted in the right thalamus. Patchy to confluent T2 hyperintensities in the cerebral white matter bilaterally are nonspecific but compatible with moderate chronic Wetherby vessel ischemia, advanced for age. Chronic lacunar infarcts are present in the cerebellum, bilateral basal ganglia, right internal capsule, and corpus callosum and deep cerebral white matter bilaterally. There is mild cerebral and moderate cerebellar atrophy. No mass, midline shift, or extra-axial fluid collection is seen. Mild to moderate chronic Avants vessel ischemic changes are present in the pons with a tiny chronic infarct noted. The pituitary gland is mildly prominent in size for age, measuring 8 mm in height. Vascular: Major intracranial vascular flow voids are preserved. Skull and upper cervical spine: Unremarkable bone marrow signal. Sinuses/Orbits: Unremarkable orbits. Paranasal sinuses and mastoid air cells are clear. Other: None. MRA HEAD FINDINGS The visualized distal vertebral arteries are patent to the basilar with the left being slightly larger than the right. There is diffuse V4 segment atherosclerotic irregularity with severe stenoses bilaterally, greater on the left. AICAs appear patent proximally with a severe proximal stenosis on the right. SCA origins are patent bilaterally. The basilar artery is patent with mild irregularity but no significant stenosis. Posterior communicating arteries are not identified. PCAs are patent with a severe proximal P2 stenosis on the right and moderate to prominent branch vessel irregularity and narrowing bilaterally. The internal carotid arteries are patent from skullbase to carotid termini with advanced siphon atherosclerosis resulting in severe tandem cavernous stenoses on the right and  moderate stenosis on the left. 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 The left A1 segment is patent with mild irregularity proximally but no significant stenosis and supplies the right ACA via the anterior communicating artery. There are severe A2 stenoses bilaterally. The MCAs are patent without evidence of significant M1 stenosis or proximal branch occlusion. There is moderate MCA branch vessel  atherosclerotic irregularity and narrowing bilaterally. No aneurysm is identified. MRI CERVICAL SPINE FINDINGS The study is mildly motion degraded. Alignment: Normal. Vertebrae: No evidence of fracture, osseous lesion, or significant marrow edema. Cord: Normal signal and morphology. Posterior Fossa, vertebral arteries, paraspinal tissues: Cerebral atrophy and infarcts as described above. Preserved vertebral artery flow voids in the neck. Disc levels: C2-3:  Negative. C3-4: Disc bulging and uncovertebral spurring result in mild right neural foraminal stenosis without spinal stenosis. C4-5: Minimal disc bulging and uncovertebral spurring without significant stenosis. C5-6:  Minimal disc bulging without stenosis. C6-7: Rolph central disc extrusion with slight caudal migration without stenosis or significant spinal cord mass effect. C7-T1: Gohr right paracentral disc protrusion without significant stenosis. IMPRESSION: 1. Patchy acute infarcts in the left greater than right cerebral hemispheres with distribution suggesting watershed ischemia. 2. 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 3. Mild prominence of the pituitary gland for age. Consider laboratory correlation. 4. Advanced intracranial atherosclerosis as above including severe right and moderate left ICA stenoses, severe bilateral vertebral artery stenoses, and severe right P2 PCA stenosis. 5. Mild cervical disc degeneration without high-grade stenosis as above. Electronically Signed   By:  Logan Bores M.D.   On: 04/01/2017 17:15    Assessment/Plan: Diagnosis: Watershed infarct, left parietal, with right hemiparesis and cognitive deficits 1. Does the need for close, 24 hr/day medical supervision in concert with the patient's rehab needs make it unreasonable for this patient to be served in a less intensive setting? Yes 2. Co-Morbidities requiring supervision/potential complications: Hypertension, hypokalemia, UTI 3. Due to bladder management, bowel management, safety, skin/wound care, disease management, medication administration, pain management and patient education, does the patient require 24 hr/day rehab nursing? Yes 4. Does the patient require coordinated care of a physician, rehab nurse, PT (1-2 hrs/day, 5 days/week), OT (1-2 hrs/day, 5 days/week) and SLP (.5-1 hrs/day, 5 days/week) to address physical and functional deficits in the context of the above medical diagnosis(es)? Yes Addressing deficits in the following areas: balance, endurance, locomotion, strength, transferring, bowel/bladder control, bathing, dressing, feeding, grooming, toileting, cognition and psychosocial support 5. Can the patient actively participate in an intensive therapy program of at least 3 hrs of therapy per day at least 5 days per week? Yes 6. The potential for patient to make measurable gains while on inpatient rehab is good 7. Anticipated functional outcomes upon discharge from inpatient rehab are supervision  with PT, supervision with OT, supervision with SLP. 8. Estimated rehab length of stay to reach the above functional goals is: 10-14d 9. Anticipated D/C setting: Home 10. Anticipated post D/C treatments: [place] therapy 11. Overall Rehab/Functional Prognosis: good  RECOMMENDATIONS: This patient's condition is appropriate for continued rehabilitative care in the following setting: CIR Patient has agreed to participate in recommended program. Yes Note that insurance prior authorization may be  required for reimbursement for recommended care.  Comment:   Charlett Blake M.D. [facility] FAAPM&R (Sports Med, Neuromuscular Med) Diplomate Am Board of Electrodiagnostic Med  Cathlyn Parsons., PA-C 04/02/2017

## 2017-04-02 NOTE — Care Management Note (Signed)
Case Management Note  Patient Details  Name: [name] [name] MRN: [id number] Date of Birth: 03-Sep-1946  Subjective/Objective:  Pt admitted with CVA. She is from home alone.                   Action/Plan: PT recommending CIR. Await OT recommendations. CM following for d/c disposition.   Expected Discharge Date:   (Pending)               Expected Discharge Plan:  [place]  In-House Referral:     Discharge planning Services     Post Acute Care Choice:    Choice offered to:     DME Arranged:    DME Agency:     HH Arranged:    [facility] Agency:     Status of Service:  In process, will continue to follow  If discussed at Long Length of Stay Meetings, dates discussed:    Additional Comments:  Pollie Friar, RN 04/02/2017, 11:46 AM

## 2017-04-02 NOTE — Evaluation (Addendum)
Occupational Therapy Evaluation Patient Details Name: [name] [name] MRN: [id number] DOB: 07/18/1946 Today's Date: 04/02/2017    History of Present Illness Patient is a 71 yo female admitted 04/01/17 with RUE weakness and unsteady gait for several days.  Patient also with hypertensive urgency and hyperglycemia.   MRI of brain - Patchy acute infarcts in the left greater than right cerebral hemispheres with distribution suggesting watershed ischemia.     PMH:  old CVA per chart, HTN   Clinical Impression   This 71 y/o F presents with the above. At baseline Pt was living alone and independently completing functional mobility and ADLs, was driving and working. Pt now requires MinA for functional mobility transfers, and MaxA for LB ADLs. Pt with R side hemiparesis, RUE currently at a Brunstrom level I. Pt has supportive daughter available to intermittently assist PRN after return home. Pt will benefit from continued OT services while in acute setting and feel Pt will greatly benefit from [place] rehab prior to return home to Max[place] safety and independence with ADLs and functional mobility.      Follow Up Recommendations  CIR;Supervision/Assistance - 24 hour    Equipment Recommendations  Other (comment) (TBD at next venue of care )           Precautions / Restrictions Precautions Precautions: Fall Precaution Comments: Rt hemiparesis Restrictions Weight Bearing Restrictions: No      Mobility Bed Mobility Overal bed mobility: Needs Assistance Bed Mobility: Supine to Sit     Supine to sit: Min assist     General bed mobility comments: Assist for RUE and scooting towards EOB, requires verbal cues to bring R side forward and in line with L side  Transfers Overall transfer level: Needs assistance Equipment used: 1 person hand held assist Transfers: Sit to/from Stand;Stand Pivot Transfers Sit to Stand: Min assist Stand pivot transfers: Min assist       General transfer comment: Pt able  to perform sit to stand with MinA, able to take very Petro steps to pivot towards recliner, Lien posterior lean noted during transfer but no LOB    Balance Overall balance assessment: Needs assistance Sitting-balance support: Single extremity supported;Feet supported Sitting balance-Leahy Scale: Fair     Standing balance support: Single extremity supported Standing balance-Leahy Scale: Poor Standing balance comment: Pt uneasy during standing, increased comfort with LUE support for balance                            ADL either performed or assessed with clinical judgement   ADL Overall ADL's : Needs assistance/impaired Eating/Feeding: Set up;Bed level   Grooming: Minimal assistance;Sitting   Upper Body Bathing: Minimal assistance;Sitting   Lower Body Bathing: Moderate assistance;Sit to/from stand   Upper Body Dressing : Minimal assistance;Sitting   Lower Body Dressing: Maximal assistance;Sit to/from stand   Toilet Transfer: Minimal assistance;Stand-pivot;BSC   Toileting- Water quality scientist and Hygiene: Sit to/from stand;Moderate assistance       Functional mobility during ADLs: Minimal assistance General ADL Comments: increased assist to complete ADLs secondary to R hemiparesis      Vision Baseline Vision/History: Wears glasses Wears Glasses: Reading only Patient Visual Report: No change from baseline Vision Assessment?: Yes Eye Alignment: Within Functional Limits Additional Comments: Attempted vision assessment, unable to complete due to Pt with increased difficulty understanidng/following directions, occular ROM appears WFL, recommend vision to be further assessed  Pertinent Vitals/Pain Pain Assessment: Faces Faces Pain Scale: No hurt     Hand Dominance Right   Extremity/Trunk Assessment Upper Extremity Assessment Upper Extremity Assessment: RUE deficits/detail RUE Deficits / Details: PROM WFL, Brunnstrom I hand and arm  RUE  Coordination: decreased fine motor;decreased gross motor   Lower Extremity Assessment Lower Extremity Assessment: Defer to PT evaluation       Communication Communication Communication: Expressive difficulties   Cognition Arousal/Alertness: Awake/alert Behavior During Therapy: WFL for tasks assessed/performed;Flat affect (occasionally smirking during conversation) Overall Cognitive Status: Impaired/Different from baseline Area of Impairment: Following commands;Awareness;Problem solving;Attention                   Current Attention Level: Sustained   Following Commands: Follows one step commands with increased time   Awareness: Emergent Problem Solving: Difficulty sequencing;Requires verbal cues;Requires tactile cues;Slow processing;Decreased initiation General Comments: Increased time to process/respond to questions, responds more quickly when given yes/no questions   General Comments                  Home Living Family/patient expects to be discharged to:: Private residence Living Arrangements: Alone Available Help at Discharge: Family;Available PRN/intermittently Type of Home: House Home Access: Stairs to enter [employer] of Steps: 4 Entrance Stairs-Rails: None Home Layout: One level     Bathroom Shower/Tub: Teacher, early years/pre: Standard     Home Equipment: None          Prior Functioning/Environment Level of Independence: Independent        Comments: Drives, works for [employer]        OT Problem List: Decreased strength;Impaired balance (sitting and/or standing);Decreased activity tolerance;Decreased cognition;Impaired UE functional use;Decreased range of motion      OT Treatment/Interventions: Self-care/ADL training;DME and/or AE instruction;Therapeutic activities;Balance training;Therapeutic exercise;Patient/family education;Neuromuscular education;Visual/perceptual remediation/compensation;Cognitive  remediation/compensation    OT Goals(Current goals can be found in the care plan section) Acute Rehab OT Goals Patient Stated Goal: To walk; gain strength back in RUE OT Goal Formulation: With patient Time For Goal Achievement: 04/16/17 Potential to Achieve Goals: Good ADL Goals Pt Will Perform Grooming: with supervision;sitting Pt Will Perform Upper Body Bathing: with supervision;sitting Pt Will Perform Lower Body Dressing: with min assist;with adaptive equipment;sit to/from stand Pt Will Transfer to Toilet: with min guard assist;stand pivot transfer;ambulating;bedside commode (BSC over toilet ) Pt Will Perform Toileting - Clothing Manipulation and hygiene: with min guard assist;sit to/from stand Pt/caregiver will Perform Home Exercise Program: Right Upper extremity;With minimal assist;Increased strength  OT Frequency: Min 3X/week                             AM-PAC PT "6 Clicks" Daily Activity     Outcome Measure Help from another person eating meals?: A Little Help from another person taking care of personal grooming?: A Little Help from another person toileting, which includes using toliet, bedpan, or urinal?: A Lot Help from another person bathing (including washing, rinsing, drying)?: A Lot Help from another person to put on and taking off regular upper body clothing?: A Little Help from another person to put on and taking off regular lower body clothing?: A Lot 6 Click Score: 15   End of Session Equipment Utilized During Treatment: Gait belt Nurse Communication: Mobility status  Activity Tolerance: Patient tolerated treatment well Patient left: in chair;with call bell/phone within reach;with family/visitor present  OT Visit Diagnosis: Hemiplegia and hemiparesis;Other abnormalities of gait and  mobility (R26.89);Unsteadiness on feet (R26.81);Muscle weakness (generalized) (M62.81);Cognitive communication deficit (R41.841) Symptoms and signs involving cognitive  functions: Cerebral infarction Hemiplegia - Right/Left: Right Hemiplegia - dominant/non-dominant: Dominant Hemiplegia - caused by: Cerebral infarction                Time: 0920-0958 OT Time Calculation (min): 38 min Charges:  OT General Charges $OT Visit: 1 Procedure OT Evaluation $OT Eval Low Complexity: 1 Procedure OT Treatments $Self Care/Home Management : 8-22 mins G-Codes: OT G-codes **NOT FOR INPATIENT CLASS** Functional Assessment Tool Used: AM-PAC 6 Clicks Daily Activity;Clinical judgement Functional Limitation: Self care Self Care Current Status (W1100): At least 60 percent but less than 80 percent impaired, limited or restricted Self Care Goal Status (P4961): At least 20 percent but less than 40 percent impaired, limited or restricted   Lou Cal, OT Pager [phone] 04/02/2017   Raymondo Band 04/02/2017, 12:11 PM

## 2017-04-02 NOTE — Progress Notes (Signed)
Inpatient Rehabilitation  PT has evaluated pt. And is recommending IP Rehab.  Patient was screened by Gerlean Ren for appropriateness for an Inpatient Acute Rehab consult.  At this time, we are recommending Inpatient Rehab consult.  I will amion page Dr. Karleen Hampshire to see if she will place order for consult.    [place] Admissions Coordinator Cell [phone] Office [phone]

## 2017-04-02 NOTE — Progress Notes (Signed)
Physical Therapy Treatment Patient Details Name: [name] [name] MRN: [id number] DOB: December 22, 1945 Today's Date: 04/02/2017    History of Present Illness Patient is a 71 yo female admitted 04/01/17 with RUE weakness and unsteady gait for several days.  Patient also with hypertensive urgency and hyperglycemia.   MRI of brain - Patchy acute infarcts in the left greater than right cerebral hemispheres with distribution suggesting watershed ischemia.     PMH:  old CVA per chart, HTN    PT Comments    Pt progressing towards goals, and able to participate in some gait training this session, however, continues to be limited by fatigue. Pt with difficulty following directional commands and difficulty with motor planning/sequencing, therefore, relied heavily on manual cues/assist. Requiring mod A for mobility this session for manual weightshifting and balance during gait. Continue to recommend CIR at d/c to increase independence with functional mobility.  Will continue to follow to progress mobility.   Follow Up Recommendations  CIR;Supervision for mobility/OOB     Equipment Recommendations  None recommended by PT    Recommendations for Other Services Rehab consult     Precautions / Restrictions Precautions Precautions: Fall Precaution Comments: Rt hemiparesis Restrictions Weight Bearing Restrictions: No    Mobility  Bed Mobility Overal bed mobility: Needs Assistance Bed Mobility: Supine to Sit     Supine to sit: Min assist     General bed mobility comments: In chair upon entry   Transfers Overall transfer level: Needs assistance Equipment used: 1 person hand held assist Transfers: Sit to/from Stand Sit to Stand: Min assist Stand pivot transfers: Min assist       General transfer comment: Min A for lift assist and steadying during standing. Pt with slight posterior lean in standing, but no LOB. Required increased time to process command to stand.    Ambulation/Gait Ambulation/Gait assistance: Mod assist Ambulation Distance (Feet): 2 Feet Assistive device: 1 person hand held assist Gait Pattern/deviations: Step-to pattern;Decreased stride length;Shuffle;Leaning posteriorly Gait velocity: Decreased Gait velocity interpretation: Below normal speed for age/gender General Gait Details: Able to take few steps to bed and back. Required seated break in between secondary to RLE fatigue. Verbal/manual cues for weight shifting for LE advancement. Difficulty following directional commands, therefore, manual cues used instead.    Stairs            Wheelchair Mobility    Modified Rankin (Stroke Patients Only) Modified Rankin (Stroke Patients Only) Pre-Morbid Rankin Score: No symptoms Modified Rankin: Moderately severe disability     Balance Overall balance assessment: Needs assistance Sitting-balance support: Single extremity supported;Feet supported Sitting balance-Leahy Scale: Fair     Standing balance support: Single extremity supported Standing balance-Leahy Scale: Poor Standing balance comment: Required use of LUE and mod A to maintain balance during functional mobility                             Cognition Arousal/Alertness: Awake/alert Behavior During Therapy: Flat affect Overall Cognitive Status: Impaired/Different from baseline Area of Impairment: Following commands;Awareness;Problem solving;Attention                   Current Attention Level: Sustained   Following Commands: Follows one step commands inconsistently;Follows one step commands with increased time   Awareness: Emergent Problem Solving: Difficulty sequencing;Requires verbal cues;Requires tactile cues;Slow processing;Decreased initiation General Comments: Pt requiring increased time to process verbal commands. Continues to report "I'm not sure how I feel"  Exercises General Exercises - Lower Extremity Ankle Circles/Pumps:  AROM;Both;10 reps;Seated Long Arc Quad: AROM;Right;10 reps;Supine (manual cues secondary to difficulty sequencing ) Other Exercises Other Exercises: Used LUE to lift RUE X 10; verbal/manual cuesfor appropriate sequencing and technique.     General Comments        Pertinent Vitals/Pain Pain Assessment: No/denies pain Faces Pain Scale: No hurt    Home Living Family/patient expects to be discharged to:: Private residence Living Arrangements: Alone Available Help at Discharge: Family;Available PRN/intermittently Type of Home: House Home Access: Stairs to enter Entrance Stairs-Rails: None Home Layout: One level Home Equipment: None      Prior Function Level of Independence: Independent      Comments: Drives, works for [employer]   PT Goals (current goals can now be found in the care plan section) Acute Rehab PT Goals Patient Stated Goal: To walk; gain strength back in RUE PT Goal Formulation: With patient/family Time For Goal Achievement: 04/08/17 Potential to Achieve Goals: Good Progress towards PT goals: Progressing toward goals    Frequency    Min 4X/week      PT Plan Current plan remains appropriate    Co-evaluation              AM-PAC PT "6 Clicks" Daily Activity  Outcome Measure  Difficulty turning over in bed (including adjusting bedclothes, sheets and blankets)?: Total Difficulty moving from lying on back to sitting on the side of the bed? : Total Difficulty sitting down on and standing up from a chair with arms (e.g., wheelchair, bedside commode, etc,.)?: Total Help needed moving to and from a bed to chair (including a wheelchair)?: A Little Help needed walking in hospital room?: A Lot Help needed climbing 3-5 steps with a railing? : Total 6 Click Score: 9    End of Session Equipment Utilized During Treatment: Gait belt Activity Tolerance: Patient limited by fatigue Patient left: in chair;with call bell/phone within reach;with chair  alarm set Nurse Communication: Mobility status PT Visit Diagnosis: Unsteadiness on feet (R26.81);Other abnormalities of gait and mobility (R26.89);Hemiplegia and hemiparesis;Other symptoms and signs involving the nervous system (R29.898) Hemiplegia - Right/Left: Right Hemiplegia - dominant/non-dominant: Dominant Hemiplegia - caused by: Cerebral infarction     Time: 9774-1423 PT Time Calculation (min) (ACUTE ONLY): 14 min  Charges:  $Gait Training: 8-22 mins                    G Codes:  Functional Assessment Tool Used: AM-PAC 6 Clicks Basic Mobility;Clinical judgement Functional Limitation: Mobility: Walking and moving around Mobility: Walking and Moving Around Current Status (T5320): At least 60 percent but less than 80 percent impaired, limited or restricted Mobility: Walking and Moving Around Goal Status [phone]): At least 20 percent but less than 40 percent impaired, limited or restricted    Nicky Pugh, PT, DPT  Acute Rehabilitation Services  Pager: [phone] 04/02/2017, 12:21 PM

## 2017-04-02 NOTE — Progress Notes (Signed)
Inpatient Rehabilitation  Rehab consult has been completed.  Admissions coordinator will follow up tomorrow.    [place] Admissions Coordinator Cell [phone] Office [phone]

## 2017-04-02 NOTE — Progress Notes (Signed)
    [facility] has been requested to perform a transesophageal echocardiogram on [name] [name] for stroke.  After careful review of history and examination, the risks and benefits of transesophageal echocardiogram have been explained including risks of esophageal damage, perforation (1:10,000 risk), bleeding, pharyngeal hematoma as well as other potential complications associated with conscious sedation including aspiration, arrhythmia, respiratory failure and death. Alternatives to treatment were discussed, questions were answered. Patient is willing to proceed.  TEE - Dr. Acie Fredrickson @ 9am. NPO after midnight. Meds with sips.   Leanor Kail, PA-C 04/02/2017 4:34 PM

## 2017-04-03 ENCOUNTER — Encounter (HOSPITAL_COMMUNITY)
Admission: EM | Disposition: A | Discharge status: Rehabilitation Facility | Payer: Self-pay | Source: Home / Self Care | Attending: Internal Medicine

## 2017-04-03 ENCOUNTER — Inpatient Hospital Stay (HOSPITAL_COMMUNITY): Payer: Medicare Other

## 2017-04-03 DIAGNOSIS — I639 Cerebral infarction, unspecified: Secondary | ICD-10-CM

## 2017-04-03 DIAGNOSIS — I6522 Occlusion and stenosis of left carotid artery: Secondary | ICD-10-CM

## 2017-04-03 DIAGNOSIS — I6521 Occlusion and stenosis of right carotid artery: Secondary | ICD-10-CM

## 2017-04-03 DIAGNOSIS — I6789 Other cerebrovascular disease: Secondary | ICD-10-CM

## 2017-04-03 DIAGNOSIS — E785 Hyperlipidemia, unspecified: Secondary | ICD-10-CM

## 2017-04-03 LAB — VAS US CAROTID
LCCADDIAS: 16 cm/s
LEFT ECA DIAS: -3 cm/s
LEFT VERTEBRAL DIAS: -19 cm/s
LICADDIAS: 20 cm/s
LICADSYS: 83 cm/s
Left CCA dist sys: 51 cm/s
Left CCA prox dias: 18 cm/s
Left CCA prox sys: 50 cm/s
Left ICA prox dias: 26 cm/s
Left ICA prox sys: 107 cm/s
RIGHT ECA DIAS: -9 cm/s
RIGHT VERTEBRAL DIAS: 31 cm/s
Right CCA prox dias: -9 cm/s
Right CCA prox sys: -49 cm/s
Right cca dist sys: -63 cm/s

## 2017-04-03 LAB — HEMOGLOBIN A1C
HEMOGLOBIN A1C: 5.7 % — AB (ref 4.8–5.6)
MEAN PLASMA GLUCOSE: 117 mg/dL

## 2017-04-03 LAB — ECHOCARDIOGRAM COMPLETE
Height: 68 in
WEIGHTICAEL: 2240 [oz_av]

## 2017-04-03 SURGERY — ECHOCARDIOGRAM, TRANSESOPHAGEAL
Anesthesia: Moderate Sedation

## 2017-04-03 NOTE — Progress Notes (Signed)
  Echocardiogram 2D Echocardiogram has been performed.  Bobbye Charleston 04/03/2017, 3:20 PM

## 2017-04-03 NOTE — Evaluation (Signed)
SLP Cancellation Note  Patient Details Name: [name] [name] MRN: [id number] DOB: May 31, 1946   Cancelled treatment:       Reason Eval/Treat Not Completed: Patient at procedure or test/unavailable   [name], [facility] SLP [phone]

## 2017-04-03 NOTE — Progress Notes (Signed)
STROKE TEAM PROGRESS NOTE   SUBJECTIVE (INTERVAL HISTORY) Her daughter is at the bedside.  Pt still has mild abulia and right arm distal weakness. She refused TEE and loop. CUS showed left carotid high grade stenosis. Will need VVS consultation.    OBJECTIVE Temp:  [97.8 F (36.6 C)-98.7 F (37.1 C)] 97.8 F (36.6 C) (05/24 0500) Pulse Rate:  [65-76] 65 (05/24 0500) Cardiac Rhythm: Normal sinus rhythm (05/23 1904) Resp:  [20] 20 (05/24 0500) BP: (136-176)/(61-76) 150/76 (05/24 0500) SpO2:  [98 %-99 %] 98 % (05/24 0500)  CBC:   Recent Labs Lab 04/01/17 0920 04/01/17 0932 04/02/17 0814  WBC 5.6  --  3.6*  NEUTROABS 4.4  --   --   HGB 13.5 13.9 14.8  HCT 42.2 41.0 45.9  MCV 80.7  --  81.2  PLT 161  --  353    Basic Metabolic Panel:   Recent Labs Lab 04/01/17 0920 04/01/17 0932 04/02/17 0814  NA 140 141 140  K 3.2* 3.2* 3.9  CL 106 104 107  CO2 24  --  26  GLUCOSE 119* 117* 93  BUN 9 9 9   CREATININE 0.81 0.80 0.83  CALCIUM 9.1  --  9.0    Lipid Panel:     Component Value Date/Time   CHOL 211 (H) 04/02/2017 0814   TRIG 59 04/02/2017 0814   HDL 69 04/02/2017 0814   CHOLHDL 3.1 04/02/2017 0814   VLDL 12 04/02/2017 0814   LDLCALC 130 (H) 04/02/2017 0814   HgbA1c:  Lab Results  Component Value Date   HGBA1C 5.7 (H) 04/02/2017   Urine Drug Screen:     Component Value Date/Time   LABOPIA NONE DETECTED 04/01/2017 0920   COCAINSCRNUR NONE DETECTED 04/01/2017 0920   LABBENZ NONE DETECTED 04/01/2017 0920   AMPHETMU NONE DETECTED 04/01/2017 0920   THCU NONE DETECTED 04/01/2017 0920   LABBARB NONE DETECTED 04/01/2017 0920    Alcohol Level     Component Value Date/Time   ETH <5 04/01/2017 0920    IMAGING I have personally reviewed the radiological images below and agree with the radiology interpretations.  Ct Head Wo Contrast 04/01/2017 Chronic white matter ischemic change. Lacunar infarcts are noted. No acute abnormality is seen.   Mr [name]  Head/brain Wo Cm 04/01/2017 1. Patchy acute infarcts in the left greater than right cerebral hemispheres with distribution suggesting watershed ischemia.  2. Age advanced chronic [place]loff vessel ischemic disease with numerous chronic infarcts as above.  3. Mild prominence of the pituitary gland for age. Consider laboratory correlation.  4. Advanced intracranial atherosclerosis as above including severe right and moderate left ICA stenoses, severe bilateral vertebral artery stenoses, and severe right P2 PCA stenosis.   Mr Chest W Wo Contrast 04/02/2017 1. No significant abnormality of the brachial plexus is identified.  2. Cervical and thoracic spondylosis.  3. 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   Mr Cervical Spine Wo Contrast 04/01/2017 Mild cervical disc degeneration without high-grade stenosis as above.   CUS Right - 1% to 39% ICA stenosis. Vertebral artery flow is antegrade. Left - Greater than 80% internal carotid artery stenosis. Vertebral artery flow is antegrade.   TTE - Vigorous LV systolic function; mild LVH; mild diastolic   dysfunction; elevated LV filling pressure; sclerotic aortic valve   with no AS or AI; trace MR and TR.  LE venous doppler Bilateral:  No evidence of DVT, superficial thrombosis, or Baker's Cyst.   PHYSICAL EXAM  Temp:  [  97.8 F (36.6 C)-98.7 F (37.1 C)] 97.8 F (36.6 C) (05/24 0500) Pulse Rate:  [65-76] 65 (05/24 0500) Resp:  [20] 20 (05/24 0500) BP: (136-176)/(61-76) 150/76 (05/24 0500) SpO2:  [98 %-99 %] 98 % (05/24 0500)  General - Well nourished, well developed, in no apparent distress.  Ophthalmologic - Fundi not visualized due to noncooperation.  Cardiovascular - Regular rate and rhythm.  Mental Status -  Level of arousal and orientation to self, age, place, and person were intact, but not orientated to time. Language including expression, naming, repetition, comprehension was assessed and found intact, but mild abulia  and psychomotor slowing  Cranial Nerves II - XII - II - Visual field intact OU. III, IV, VI - Extraocular movements intact. V - Facial sensation intact bilaterally. VII - Facial movement intact bilaterally. VIII - Hearing & vestibular intact bilaterally. X - Palate elevates symmetrically. XI - Chin turning & shoulder shrug intact bilaterally. XII - Tongue protrusion intact.  Motor Strength - The patient's strength was normal in all extremities except RUE proximal 4/5, distal 0/5. Pt does have mild neglect of the right arm.  Bulk was normal and fasciculations were absent.   Motor Tone - Muscle tone was assessed at the neck and appendages and was normal.  Reflexes - The patient's reflexes were 1+ in all extremities and she had no pathological reflexes.  Sensory - Light touch, temperature/pinprick were assessed and were symmetrical.    Coordination - The patient had normal movements in the left hand with no ataxia or dysmetria.  Tremor was absent.  Gait and Station - deferred  ASSESSMENT/PLAN Ms. Jory Welke is a 71 y.o. female with history of previous strokes  presenting with right upper extremity weakness, hypertension urgency, urinary tract infection, and altered mental status.  She did not receive IV t-PA due to late presentation.  Strokes: Patchy acute infarcts in the left MCA and b/l ACAs, embolic pattern due to large vessel disease with left ICA high grade stenosis with azygous ACA from the left. Her left MCA and b/l ACA infarcts can be explained solely but left ICA high grade stenosis. TEE and loop cancelled.   Resultant  RUE weakness with mild neglect on the RUE  CT head - Chronic white matter ischemic change. Lacunar infarcts are noted. No acute abnormality is seen.   MRI head - Patchy acute infarcts in the left greater than right cerebral hemispheres. Numerous chronic infarcts.  MRA head - severe Rt and moderate Lt ICA stenoses, severe bilateral VA stenoses, and severe  right P2 PCA stenosis.   Carotid Doppler left ICA high grade stenosis  2D Echo - EF 65-70%  BLE dopplers - no DVT  TEE and loop recorder cancelled due to her stroke can be solely explained by left ICA stenosis  LDL - 130  HgbA1c 5.7  VTE prophylaxis - Lovenox Diet NPO time specified Except for: Sips with Meds  No antithrombotic prior to admission, now on aspirin 325 mg daily and clopidogrel 75 mg daily. Will recommend to continue DAPT for intracranial stenosis for 3 months and then plavix alone.  Patient counseled to be compliant with her antithrombotic medications  Ongoing aggressive stroke risk factor management  Therapy recommendations: CIR   Disposition: Pending  Left ICA high grade stenosis  CUS showed left ICA critical stenosis  VVS consult recommended.  Dr. Donzetta Matters plan for CEA next Tuesday  BP goal 130-150 prior to CEA and normotensive after CEA  Hypertension  Stable  BP goal  130-150 prior to CEA and normotensive after CEA  Hyperlipidemia  Home meds: No lipid lowering medications prior to admission.  LDL 130, goal < 70  Start Lipitor 80 mg daily.  Continue statin at discharge  Other Stroke Risk Factors  Advanced age  Hx stroke/TIA - on MRI images  Other [facility] day # 1  Neurology will sign off. Please call with questions. Pt will follow up with Cecille Rubin NP at [facility] in about 6 weeks. Thanks for the consult.  Rosalin Hawking, MD PhD Stroke Neurology 04/03/2017 5:02 PM  To contact Stroke Continuity provider, please refer to [email or web address] After hours, contact General Neurology

## 2017-04-03 NOTE — Consult Note (Signed)
ELECTROPHYSIOLOGY CONSULT NOTE  Patient ID: [name] [name] MRN: [id number], DOB/AGE: Nov 16, 1945   Admit date: 04/01/2017 Date of Consult: 04/03/2017  Primary Physician: Patient, No Pcp Per Primary Cardiologist: new to [facility] Reason for Consultation: Cryptogenic stroke; recommendations regarding Implantable Loop Recorder  History of Present Illness [name] [name] is referred by Dr Erlinda Hong for consideration of ILR in the setting of cryptogenic stroke. She was admitted on 04/01/2017 with right arm weakness for 4 days.  She had associated hypertensive urgency when she presented to the hospital.  Imaging demonstrated patchy right infarcts in the left MCA and bilateral ACA's felt to be embolic 2/2 unknown source.  She has undergone workup for stroke including echocardiogram and carotid dopplers.  The patient has been monitored on telemetry which has demonstrated sinus rhythm with no arrhythmias.  Inpatient stroke work-up is to be completed with a TEE.   Echocardiogram this admission is pending.   Prior to admission, the patient denies chest pain, shortness of breath, dizziness, palpitations, or syncope.  They are recovering from their stroke with plans to go to CIR at discharge.  EP has been asked to evaluate for placement of an implantable loop recorder to monitor for atrial fibrillation.  Past Medical History:  Diagnosis Date  . Stroke [facility])      Surgical History: History reviewed. No pertinent surgical history.   Prescriptions Prior to Admission  Medication Sig Dispense Refill Last Dose  . Multiple Vitamins-Minerals (MULTIVITAMIN WITH MINERALS) tablet Take 1 tablet by mouth daily.   Past Month at Unknown time  . Tetrahydrozoline-Zn Sulfate (EYE DROPS ALLERGY RELIEF OP) Place 2 drops into both eyes 2 (two) times daily.   Past Week at Unknown time    Inpatient Medications:  . aspirin  325 mg Oral Daily  . atorvastatin  80 mg Oral q1800  . clopidogrel  75 mg Oral Daily  . enoxaparin  (LOVENOX) injection  40 mg Subcutaneous Q24H  . sodium chloride flush  3 mL Intravenous Q12H    Allergies: No Known Allergies  Social History   Social History  . Marital status: Single    Spouse name: N/A  . Number of children: N/A  . Years of education: N/A   Occupational History  . Not on file.   Social History Main Topics  . Smoking status: Never Smoker  . Smokeless tobacco: Never Used  . Alcohol use No  . Drug use: No  . Sexual activity: Not on file   Other Topics Concern  . Not on file   Social History Narrative  . No narrative on file     Family History  Problem Relation Age of Onset  . Cancer Mother      Review of Systems: All other systems reviewed and are otherwise negative except as noted above.  Physical Exam: Vitals:   04/02/17 1752 04/02/17 2117 04/03/17 0100 04/03/17 0500  BP: (!) 155/70 (!) 156/69 (!) 176/71 (!) 150/76  Pulse: 72 73 76 65  Resp: 20 20 20 20   Temp: 98.4 F (36.9 C) 98.5 F (36.9 C) 98.6 F (37 C) 97.8 F (36.6 C)  TempSrc: Oral Oral Oral Oral  SpO2: 99% 98% 99% 98%  Weight:      Height:        GEN- The patient is well appearing, alert and oriented x 3 today.   Head- normocephalic, atraumatic Eyes-  Sclera clear, conjunctiva pink Ears- hearing intact Oropharynx- clear Neck- supple Lungs- Clear to ausculation bilaterally, normal work of  breathing Heart- Regular rate and rhythm, no murmurs, rubs or gallops  GI- soft, NT, ND, + BS Extremities- no clubbing, cyanosis, or edema MS- no significant deformity or atrophy Skin- no rash or lesion Psych- euthymic mood, flat affect   Labs:   Lab Results  Component Value Date   WBC 3.6 (L) 04/02/2017   HGB 14.8 04/02/2017   HCT 45.9 04/02/2017   MCV 81.2 04/02/2017   PLT 172 04/02/2017    Recent Labs Lab 04/01/17 0920  04/02/17 0814  NA 140  < > 140  K 3.2*  < > 3.9  CL 106  < > 107  CO2 24  --  26  BUN 9  < > 9  CREATININE 0.81  < > 0.83  CALCIUM 9.1  --  9.0    PROT 6.7  --   --   BILITOT 0.9  --   --   ALKPHOS 68  --   --   ALT 12*  --   --   AST 17  --   --   GLUCOSE 119*  < > 93  < > = values in this interval not displayed.   Radiology/Studies: Ct Head Wo Contrast Result Date: 04/01/2017 CLINICAL DATA:  Right arm paralysis for several days, initial encounter EXAM: CT HEAD WITHOUT CONTRAST TECHNIQUE: Contiguous axial images were obtained from the base of the skull through the vertex without intravenous contrast. COMPARISON:  None. FINDINGS: Brain: Chronic white matter ischemic change is seen. No findings to suggest acute hemorrhage, acute infarction or space-occupying mass lesion are noted. Prior lacunar infarcts are noted within the internal capsule on the right in the basal ganglia on the left. Vascular: No hyperdense vessel or unexpected calcification. Skull: Normal. Negative for fracture or focal lesion. Sinuses/Orbits: No acute finding. Other: None. IMPRESSION: Chronic white matter ischemic change. Lacunar infarcts are noted. No acute abnormality is seen. Electronically Signed   By: Inez Catalina M.D.   On: 04/01/2017 10:53   Mr Brain Wo Contrast Result Date: 04/01/2017 CLINICAL DATA:  Right arm weakness and unsteady gait for 2 days. EXAM: MRI HEAD WITHOUT CONTRAST MRA HEAD WITHOUT CONTRAST MRI CERVICAL SPINE WITHOUT CONTRAST TECHNIQUE: Multiplanar, multiecho pulse sequences of the brain and surrounding structures were obtained without intravenous contrast. Angiographic images of the head were obtained using MRA technique without contrast. Multiplanar, multiecho pulse sequences of the cervical spine were obtained without intravenous contrast. COMPARISON:  Head CT 04/01/2017 FINDINGS: MRI HEAD FINDINGS Brain: There are numerous Mcgahan patchy acute infarcts predominantly sagittally oriented in the left cerebral hemisphere involving cortex and white matter with greatest involvement of the centrum semiovale suggestive of watershed ischemia. There are a few  tiny acute infarcts in a similar distribution in the right cerebral hemisphere predominantly in the frontal and parietal lobes near the vertex. A 6 mm acute infarct is present in the genu of the corpus callosum. A chronic microhemorrhage is noted in the right thalamus. Patchy to confluent T2 hyperintensities in the cerebral white matter bilaterally are nonspecific but compatible with moderate chronic Bott vessel ischemia, advanced for age. Chronic lacunar infarcts are present in the cerebellum, bilateral basal ganglia, right internal capsule, and corpus callosum and deep cerebral white matter bilaterally. There is mild cerebral and moderate cerebellar atrophy. No mass, midline shift, or extra-axial fluid collection is seen. Mild to moderate chronic [place] vessel ischemic changes are present in the pons with a tiny chronic infarct noted. The pituitary gland is mildly prominent in size for  age, measuring 8 mm in height. Vascular: Major intracranial vascular flow voids are preserved. Skull and upper cervical spine: Unremarkable bone marrow signal. Sinuses/Orbits: Unremarkable orbits. Paranasal sinuses and mastoid air cells are clear. Other: None. MRA HEAD FINDINGS The visualized distal vertebral arteries are patent to the basilar with the left being slightly larger than the right. There is diffuse V4 segment atherosclerotic irregularity with severe stenoses bilaterally, greater on the left. AICAs appear patent proximally with a severe proximal stenosis on the right. SCA origins are patent bilaterally. The basilar artery is patent with mild irregularity but no significant stenosis. Posterior communicating arteries are not identified. PCAs are patent with a severe proximal P2 stenosis on the right and moderate to prominent branch vessel irregularity and narrowing bilaterally. The internal carotid arteries are patent from skullbase to carotid termini with advanced siphon atherosclerosis resulting in severe tandem  cavernous stenoses on the right and moderate stenosis on the left. 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 The left A1 segment is patent with mild irregularity proximally but no significant stenosis and supplies the right ACA via the anterior communicating artery. There are severe A2 stenoses bilaterally. The MCAs are patent without evidence of significant M1 stenosis or proximal branch occlusion. There is moderate MCA branch vessel atherosclerotic irregularity and narrowing bilaterally. No aneurysm is identified. MRI CERVICAL SPINE FINDINGS The study is mildly motion degraded. Alignment: Normal. Vertebrae: No evidence of fracture, osseous lesion, or significant marrow edema. Cord: Normal signal and morphology. Posterior Fossa, vertebral arteries, paraspinal tissues: Cerebral atrophy and infarcts as described above. Preserved vertebral artery flow voids in the neck. Disc levels: C2-3:  Negative. C3-4: Disc bulging and uncovertebral spurring result in mild right neural foraminal stenosis without spinal stenosis. C4-5: Minimal disc bulging and uncovertebral spurring without significant stenosis. C5-6:  Minimal disc bulging without stenosis. C6-7: Alumbaugh central disc extrusion with slight caudal migration without stenosis or significant spinal cord mass effect. C7-T1: [place] right paracentral disc protrusion without significant stenosis. IMPRESSION: 1. Patchy acute infarcts in the left greater than right cerebral hemispheres with distribution suggesting watershed ischemia. 2. Age advanced chronic [place] vessel ischemic disease with numerous chronic infarcts as above. 3. Mild prominence of the pituitary gland for age. Consider laboratory correlation. 4. Advanced intracranial atherosclerosis as above including severe right and moderate left ICA stenoses, severe bilateral vertebral artery stenoses, and severe right P2 PCA stenosis. 5. Mild cervical disc degeneration without high-grade stenosis as  above. Electronically Signed   By: Logan Bores M.D.   On: 04/01/2017 17:15    12-lead ECG sinus rhythm All prior EKG's in EPIC reviewed with no documented atrial fibrillation  Telemetry sinus rhythm  Assessment and Plan:  1. Cryptogenic stroke The patient presents with cryptogenic stroke.  The patient has a TEE planned for this AM.  I spoke at length with the patient about monitoring for afib with either a 30 day event monitor or an implantable loop recorder.  Risks, benefits, and alteratives to implantable loop recorder were discussed with the patient today.   At this time, the patient is very clear in their decision to decline implantable loop recorder at this time. She is willing to come back to the office in several weeks to discuss again. I will arrange.    Please call with questions.   Chanetta Marshall, NP 04/03/2017 7:07 AM  EP Attending  Agree with above. Will see back in the office.   Mikle Bosworth.D.

## 2017-04-03 NOTE — PMR Pre-admission (Signed)
PMR Admission Coordinator Pre-Admission Assessment  Patient: [name] [name] is an 71 y.o., female MRN: [id number] DOB: 1946/02/13 Height: 5\' 8"  (172.7 cm) Weight: 63.5 kg (140 lb)              Insurance Information HMO:     PPO:      PCP:      IPA:      80/20:      OTHER: X PRIMARY: BCBS       Policy#: [id number]      Subscriber: Self CM Name: Jeanie Sewer      Phone#:  [phone]     Fax#: [phone] Pre-Cert#:  [id number], approved until 04/17/17, clinical updates due 04/16/17   Employer:  Benefits:  Phone #: [phone]     Name: verified online Eff. Date: 11/11/16     Deduct: $800      Out of Pocket Max: $2450 (includes deductible)       Life Max: N/A CIR: 70%/30%      SNF: 70%/30%, 60 day limit Outpatient: 30 visits      Co-Pay: $20 Home Health: 70%      Co-Pay: 30%, 60 day limit DME: 70%     Co-Pay: 30% Providers: in-network   Medicaid Application Date:       Case Manager:  Disability Application Date:       Case Worker:   Emergency Facilities manager Information    Name Relation Home Work [place] Daughter [phone]       Current Medical History  Patient Admitting Diagnosis: Watershed infarct, left parietal, with right hemiparesis and cognitive deficits  History of Present Illness: [name] [name] a 71 y.o.right handed femaleon no prescription medications. Per chart review patient independent prior to admission living alone. She does drive. One level home with 4 steps to entry.Family inarea question assistance on discharge.Presented 04/01/2017 with right-sided weakness and unsteady gait 2 days. CT/MRI showed patchy acute infarcts in the left greater than right cerebral hemispheres with distribution suggesting watershed ischemia. Advanced Garzon vessel ischemic changes. MRA with atherosclerotic type changes including severe right and moderate left ICA stenosis, severe bilateral vertebral artery stenosis.Carotid Doppler showed 1-39% right ICA  stenosis and greater than 80% left ICA stenosis. MRI cervical spine with mild cervical disc degeneration. MRI of the chest negative. Patient did not receive TPA. Echocardiogram with ejection fraction of 86% grade 1 diastolic dysfunction. There was plan for TEE and possible loop recorder placement however patient declined procedure at this time and plan to follow-up outpatient to discuss further options. Neurology consulted presently on aspirin and Plavix for CVA prophylaxis. Subcutaneous Lovenox for DVT prophylaxis. Follow-up vascular surgery for carotid stenosis and underwent left carotid endarterectomy 04/08/2017 per Dr. Servando Snare. Tolerating a regular diet. Physical and occupational therapy evaluations completed with recommendations of physical medicine rehabilitation consult. Patient was admitted for comprehensive rehabilitation program.  NIH Total: 4    Past Medical History  Past Medical History:  Diagnosis Date  . Stroke [facility])     Family History  family history includes Cancer in her mother.  Prior Rehab/Hospitalizations:  Has the patient had major surgery during 100 days prior to admission? No  Current Medications   Current Facility-Administered Medications:  .  0.9 %  sodium chloride infusion, 250 mL, Intravenous, PRN, Dionne Milo, NP .  0.9 %  sodium chloride infusion, , Intravenous, Continuous, Bhagat, Bhavinkumar, PA .  0.9 %  sodium chloride infusion, 500 mL, Intravenous, Once PRN, Ulyses Amor, PA-C .  0.9 %  sodium chloride infusion, , Intravenous, Continuous, Ulyses Amor, PA-C, Last Rate: 50 mL/hr at 04/08/17 1636 .  acetaminophen (TYLENOL) tablet 650 mg, 650 mg, Oral, Q6H PRN **OR** acetaminophen (TYLENOL) suppository 650 mg, 650 mg, Rectal, Q6H PRN, Dionne Milo, NP .  alum & mag hydroxide-simeth (MAALOX/MYLANTA) 200-200-20 MG/5ML suspension 15-30 mL, 15-30 mL, Oral, Q2H PRN, Theda Sers, Emma M, PA-C .  amLODipine (NORVASC) tablet 2.5 mg, 2.5 mg, Oral,  Daily, Hosie Poisson, MD, 2.5 mg at 04/07/17 0909 .  aspirin tablet 325 mg, 325 mg, Oral, Daily, Dionne Milo, NP, 325 mg at 04/07/17 0910 .  atorvastatin (LIPITOR) tablet 80 mg, 80 mg, Oral, q1800, Rinehuls, David L, PA-C, 80 mg at 04/08/17 1634 .  bisacodyl (DULCOLAX) EC tablet 5 mg, 5 mg, Oral, Daily PRN, Laurence Slate M, PA-C .  clopidogrel (PLAVIX) tablet 75 mg, 75 mg, Oral, Daily, Rosalin Hawking, MD, 75 mg at 04/07/17 0910 .  docusate sodium (COLACE) capsule 100 mg, 100 mg, Oral, Daily, Collins, Emma M, PA-C .  enoxaparin (LOVENOX) injection 40 mg, 40 mg, Subcutaneous, Q24H, Dionne Milo, NP, 40 mg at 04/08/17 1639 .  guaiFENesin-dextromethorphan (ROBITUSSIN DM) 100-10 MG/5ML syrup 15 mL, 15 mL, Oral, Q4H PRN, Theda Sers, Emma M, PA-C .  hydrALAZINE (APRESOLINE) injection 5 mg, 5 mg, Intravenous, Q20 Min PRN, Collins, Emma M, PA-C .  labetalol (NORMODYNE,TRANDATE) injection 10 mg, 10 mg, Intravenous, Q4H PRN, Dionne Milo, NP, 10 mg at 04/01/17 1320 .  labetalol (NORMODYNE,TRANDATE) injection 10 mg, 10 mg, Intravenous, Q10 min PRN, Theda Sers, Emma M, PA-C .  magnesium sulfate IVPB 2 g 50 mL, 2 g, Intravenous, Daily PRN, Theda Sers, Emma M, PA-C .  morphine 4 MG/ML injection 1-3 mg, 1-3 mg, Intravenous, Q1H PRN, Theda Sers, Emma M, PA-C .  ondansetron (ZOFRAN) tablet 4 mg, 4 mg, Oral, Q6H PRN **OR** ondansetron (ZOFRAN) injection 4 mg, 4 mg, Intravenous, Q6H PRN, Dionne Milo, NP .  oxyCODONE-acetaminophen (PERCOCET/ROXICET) 5-325 MG per tablet 1-2 tablet, 1-2 tablet, Oral, Q4H PRN, Theda Sers, Emma M, PA-C .  pantoprazole (PROTONIX) EC tablet 40 mg, 40 mg, Oral, Daily, Laurence Slate M, PA-C, 40 mg at 04/08/17 1635 .  phenol (CHLORASEPTIC) mouth spray 1 spray, 1 spray, Mouth/Throat, PRN, Theda Sers, Emma M, PA-C .  potassium chloride SA (K-DUR,KLOR-CON) CR tablet 20-40 mEq, 20-40 mEq, Oral, Daily PRN, Theda Sers, Emma M, PA-C .  senna-docusate (Senokot-S) tablet 1 tablet, 1 tablet, Oral,  QHS PRN, Dionne Milo, NP, 1 tablet at 04/05/17 [phone] .  sodium chloride flush (NS) 0.9 % injection 3 mL, 3 mL, Intravenous, Q12H, Dionne Milo, NP, 3 mL at 04/07/17 2255 .  sodium chloride flush (NS) 0.9 % injection 3 mL, 3 mL, Intravenous, PRN, Frederik Pear Lynnell Jude, NP  Patients Current Diet: Diet Heart Room service appropriate? Yes; Fluid consistency: Thin  Precautions / Restrictions Precautions Precautions: Fall Precaution Comments: Rt hemiparesis Restrictions Weight Bearing Restrictions: No   Has the patient had 2 or more falls or a fall with injury in the past year?No  Prior Activity Level Community (5-7x/wk): Prior to admission patient lived alone and was fully independent working for the [employer].   Home Assistive Devices / Equipment Home Assistive Devices/Equipment: Eyeglasses Home Equipment: None  Prior Device Use: Indicate devices/aids used by the patient prior to current illness, exacerbation or injury? None of the above  Prior Functional Level Prior Function Level of Independence: Independent Comments: Drives, works for [employer]: Did  the patient need help bathing, dressing, using the toilet or eating? Independent  Indoor Mobility: Did the patient need assistance with walking from room to room (with or without device)? Independent  Stairs: Did the patient need assistance with internal or external stairs (with or without device)? Independent  Functional Cognition: Did the patient need help planning regular tasks such as shopping or remembering to take medications? Independent  Current Functional Level Cognition  Arousal/Alertness: Awake/alert Overall Cognitive Status: Impaired/Different from baseline Current Attention Level: Selective Orientation Level: Oriented X4 Following Commands: Follows one step commands inconsistently General Comments: When performing seated ADL, Pt struggled with problem solving how  to do tasks that were typically 2 handed tasks with one hand Attention: Sustained Sustained Attention: Impaired Sustained Attention Impairment: Verbal basic, Functional basic Memory: Impaired Memory Impairment: Storage deficit, Decreased recall of new information, Decreased short term memory Decreased Short Term Memory: Verbal basic, Functional basic Awareness: Impaired Awareness Impairment: Intellectual impairment Problem Solving: Impaired Problem Solving Impairment: Verbal basic, Functional basic Executive Function: Reasoning, Sequencing, Initiating Reasoning: Impaired Reasoning Impairment: Verbal basic, Functional basic Sequencing: Impaired Sequencing Impairment: Verbal basic, Functional basic Initiating: Impaired Initiating Impairment: Verbal basic, Functional basic Safety/Judgment: Impaired Comments: reduced awareness/insight    Extremity Assessment (includes Sensation/Coordination)  Upper Extremity Assessment: RUE deficits/detail RUE Deficits / Details: PROM WFL, [place] I hand and arm  RUE Coordination: decreased fine motor, decreased gross motor  Lower Extremity Assessment: Defer to PT evaluation RLE Deficits / Details: Decreased strength - hip flexion 3/5, knee extension 3+/5, DF 0/5.  When she attempts to DF ankle, her ankle moves into PF. RLE Coordination: decreased gross motor    ADLs  Overall ADL's : Needs assistance/impaired Eating/Feeding: Set up, Bed level Grooming: Moderate assistance, Sitting Grooming Details (indicate cue type and reason): in recliner Upper Body Bathing: Moderate assistance, Sitting Upper Body Bathing Details (indicate cue type and reason): using wash cloth, HOH with RUE Lower Body Bathing: Moderate assistance, Sit to/from stand Upper Body Dressing : Minimal assistance, Sitting Lower Body Dressing: Maximal assistance, Sit to/from stand Toilet Transfer: Minimal assistance, Ambulation, Comfort height toilet (1 HHA) Toilet Transfer Details  (indicate cue type and reason): when RUE is positioned prior to transfer, Pt will bear weight through it Toileting- Clothing Manipulation and Hygiene: Sit to/from stand, Moderate assistance Functional mobility during ADLs: Minimal assistance General ADL Comments: Focus of session on facilitation of RUE functional movemetn patterns using PNF and NDT techniques. Movement improved with proprioceptive input and repetiion of movement patterns.     Mobility  Overal bed mobility: Needs Assistance Bed Mobility: Supine to Sit Supine to sit: Min assist, HOB elevated Sit to supine: Min assist General bed mobility comments: Facilitation for side lying to supine    Transfers  Overall transfer level: Needs assistance Equipment used: 1 person hand held assist Transfers: Sit to/from Stand Sit to Stand: Min assist Stand pivot transfers: Min assist General transfer comment: Min assist for balance support and safety. Placed R hand on arm rest of the chair to stand and pt was able to push up and put some weight through it.    Ambulation / Gait / Stairs / Wheelchair Mobility  Ambulation/Gait Ambulation/Gait assistance: Min assist, +2 safety/equipment Ambulation Distance (Feet): 100 Feet Assistive device: 1 person hand held assist Gait Pattern/deviations: Step-through pattern, Decreased stride length, Shuffle, Decreased dorsiflexion - right, Decreased step length - right, Trunk flexed General Gait Details: VC's for sequencing, improved posture, and increased step/stride length with more aggressive heel strike. Pt was  unable to make corrective changes. Was able to participate in higher level balance activity including side stepping to the R and L, backwards walking, and stepping over an object. Increased assistance reqired for backwards walking and stepping over an object.  Gait velocity: Decreased Gait velocity interpretation: Below normal speed for age/gender    Posture / Balance Dynamic Sitting  Balance Sitting balance - Comments: R bias Balance Overall balance assessment: Needs assistance Sitting-balance support: Single extremity supported, Feet supported Sitting balance-Leahy Scale: Fair Sitting balance - Comments: R bias Standing balance support: Single extremity supported Standing balance-Leahy Scale: Poor Standing balance comment: Required use of LUE and mod A to maintain balance during functional mobility     Special needs/care consideration BiPAP/CPAP: No CPM: No Continuous Drip IV: No Dialysis: No         Life Vest: No Oxygen: No Special Bed: No Trach Size: No Wound Vac (area): No       Skin:  Surgical incision  L neck from CEA                            Bowel mgmt: 04/06/17, continent  Bladder mgmt: Continent  Diabetic mgmt: Hemoglobin A1c 5.7     Previous Home Environment Living Arrangements: Alone  Lives With: Alone Available Help at Discharge: Family, Available PRN/intermittently Type of Home: House Home Layout: One level Home Access: Stairs to enter Entrance Stairs-Rails: None Technical brewer of Steps: 4 Bathroom Shower/Tub: Chiropodist: Standard Home Care Services: No  Discharge Living Setting Plans for Discharge Living Setting: Patient's home, House, Alone Type of Home at Discharge: House Discharge Home Layout: One level Discharge Home Access: Stairs to enter Entrance Stairs-Rails: None Entrance Stairs-Number of Steps: 4 Discharge Bathroom Shower/Tub: Tub/shower unit Discharge Bathroom Toilet: Standard Discharge Bathroom Accessibility: Yes How Accessible: Accessible via walker Does the patient have any problems obtaining your medications?: No  Social/Family/Support Systems Patient Roles: Parent Contact Information: Daughter: Elita Quick [phone] Anticipated Caregiver: Daughter stated that patient would have 24/7 if she needed it  Anticipated Caregiver's Contact Information: see above Caregiver  Availability: 24/7 Discharge Plan Discussed with Primary Caregiver: Yes Is Caregiver In Agreement with Plan?: Yes Does Caregiver/Family have Issues with Lodging/Transportation while Pt is in Rehab?: No  Goals/Additional Needs Patient/Family Goal for Rehab: PT/OT/SLP Supervision  Expected length of stay: 10-14 days  Cultural Considerations: None Dietary Needs: Heart Healthy Equipment Needs: TBD Special Service Needs: None Additional Information: None Pt/Family Agrees to Admission and willing to participate: Yes Program Orientation Provided & Reviewed with Pt/Caregiver Including Roles  & Responsibilities: Yes Additional Information Needs: None Information Needs to be Provided By: N/A   Decrease burden of Care through IP rehab admission: No  Possible need for SNF placement upon discharge: No  Patient Condition: This patient's medical and functional status has changed since the consult dated: 04/02/17 in which the Rehabilitation Physician determined and documented that the patient's condition is appropriate for intensive rehabilitative care in an inpatient rehabilitation facility. See "History of Present Illness" (above) for medical update. Functional changes are: pt. At min assist with bed mobility and transfers, +2 min assist (hand hold) 100' with balance deficits. Patient's medical and functional status update has been discussed with the Rehabilitation physician and patient remains appropriate for inpatient rehabilitation. Will admit to inpatient rehab today.  Preadmission Screen Completed By:  Gerlean Ren 04/09/17  ______________________________________________________________________   Discussed status with Dr. Posey Pronto on 04/09/17 at  [phone]  and received telephone approval for admission today.  Admission Coordinator:  Gerlean Ren, time [id number] 04/09/17

## 2017-04-03 NOTE — Consult Note (Signed)
[facility] NOTE   MRN : [id number]  Reason for referal: left carotid stenosis Referring Physician: Hosie Poisson MD  History of Present Illness: [name] [name] a 71 y.o.femalewho presents with R arm weakness concerning for stroke and Hypertension urgency with BP of 225/110.   The symptoms started 2 days ago.  She called her daughter and told her she thinks she is having a stroke.She does not have a history of HTN, stroke, DM or CAD.  She does not have a primary care Physician she sees regularly and she is not on any prescription medications.   Before this incident she was self sufficient with driving, shopping and cooking.  Her work up has shown left carotid stenosis > 80 %via duplex  And right UE hemiparesis with CVA.       Current Facility-Administered Medications  Medication Dose Route Frequency Provider Last Rate Last Dose  . 0.9 %  sodium chloride infusion  250 mL Intravenous PRN Dionne Milo, NP      . 0.9 %  sodium chloride infusion   Intravenous Continuous Dionne Milo, NP 10 mL/hr at 04/01/17 1435    . acetaminophen (TYLENOL) tablet 650 mg  650 mg Oral Q6H PRN Dionne Milo, NP       Or  . acetaminophen (TYLENOL) suppository 650 mg  650 mg Rectal Q6H PRN Dionne Milo, NP      . aspirin tablet 325 mg  325 mg Oral Daily Dionne Milo, NP   325 mg at 04/03/17 1059  . atorvastatin (LIPITOR) tablet 80 mg  80 mg Oral q1800 Rinehuls, David L, PA-C   80 mg at 04/02/17 1754  . clopidogrel (PLAVIX) tablet 75 mg  75 mg Oral Daily Rosalin Hawking, MD   75 mg at 04/03/17 1059  . enoxaparin (LOVENOX) injection 40 mg  40 mg Subcutaneous Q24H Dionne Milo, NP   40 mg at 04/02/17 1753  . labetalol (NORMODYNE,TRANDATE) injection 10 mg  10 mg Intravenous Q4H PRN Dionne Milo, NP   10 mg at 04/01/17 1320  . ondansetron (ZOFRAN) tablet 4 mg  4 mg Oral Q6H PRN Dionne Milo, NP       Or  . ondansetron [facility])  injection 4 mg  4 mg Intravenous Q6H PRN Dionne Milo, NP      . senna-docusate (Senokot-S) tablet 1 tablet  1 tablet Oral QHS PRN Dionne Milo, NP      . sodium chloride flush (NS) 0.9 % injection 3 mL  3 mL Intravenous Q12H Dionne Milo, NP   3 mL at 04/02/17 2300  . sodium chloride flush (NS) 0.9 % injection 3 mL  3 mL Intravenous PRN Dionne Milo, NP        Pt meds prior to admission include: Statin :No Betablocker: No ASA: No Other anticoagulants/antiplatelets: None  Past Medical History:  Diagnosis Date  . Stroke [facility])     History reviewed. No pertinent surgical history.  Social History Social History  Substance Use Topics  . Smoking status: Never Smoker  . Smokeless tobacco: Never Used  . Alcohol use No    Family History Family History  Problem Relation Age of Onset  . Cancer Mother   MI, CAD  No Known Allergies   REVIEW OF SYSTEMS  General: [ ]  Weight loss, [ ]  Fever, [ ]  chills Neurologic: [ ]  Dizziness, [ ]  Blackouts, [ ]  Seizure [ ]  Stroke, [ ]  "  Mini stroke", [ ]  Slurred speech, [ ]  Temporary blindness; [x ] weakness in arms or legs, [ ]  Hoarseness [ ]  Dysphagia Cardiac: [ ]  Chest pain/pressure, [ ]  Shortness of breath at rest [ ]  Shortness of breath with exertion, [ ]  Atrial fibrillation or irregular heartbeat HTN urgency [x]  Vascular: [ ]  Pain in legs with walking, [ ]  Pain in legs at rest, [ ]  Pain in legs at night,  [ ]  Non-healing ulcer, [ ]  Blood clot in vein/DVT,   Pulmonary: [ ]  Home oxygen, [ ]  Productive cough, [ ]  Coughing up blood, [ ]  Asthma,  [ ]  Wheezing [ ]  COPD Musculoskeletal:  [ ]  Arthritis, [ ]  Low back pain, [ ]  Joint pain Hematologic: [ ]  Easy Bruising, [ ]  Anemia; [ ]  Hepatitis Gastrointestinal: [ ]  Blood in stool, [ ]  Gastroesophageal Reflux/heartburn, Urinary: [ ]  chronic Kidney disease, [ ]  on HD - [ ]  MWF or [ ]  TTHS, [ ]  Burning with urination, [ ]  Difficulty urinating Skin: [ ]  Rashes, [ ]   Wounds Psychological: [ ]  Anxiety, [ ]  Depression  Physical Examination Vitals:   04/02/17 2117 04/03/17 0100 04/03/17 0500 04/03/17 1007  BP: (!) 156/69 (!) 176/71 (!) 150/76 (!) 183/64  Pulse: 73 76 65 66  Resp: 20 20 20 18   Temp:  98.6 F (37 C) 97.8 F (36.6 C) 98.2 F (36.8 C)  TempSrc: Oral Oral Oral Oral  SpO2: 98% 99% 98% 100%  Weight:      Height:       Body mass index is 21.29 kg/m.  General:  WDWN in NAD HENT: WNL Eyes: Pupils equal Pulmonary: normal non-labored breathing , without Rales, rhonchi,  wheezing Cardiac: RRR, without  Murmurs, rubs or gallops; No carotid bruits Abdomen: soft, NT, no masses Skin: no rashes, ulcers noted;  no Gangrene , no cellulitis; no open wounds;   Vascular Exam/Pulses:radial, femoral, popliteal, DP pulses B   Musculoskeletal: no muscle wasting or atrophy; no edema, paralisis right UE with decreased sensation.  Neurologic: A&O X 3; Appropriate Affect ;  SENSATION: normal; MOTOR FUNCTION: 5/5 L UE, B LE, 0/5 right UE Speech is fluent/normal   Significant Diagnostic Studies: CBC Lab Results  Component Value Date   WBC 3.6 (L) 04/02/2017   HGB 14.8 04/02/2017   HCT 45.9 04/02/2017   MCV 81.2 04/02/2017   PLT 172 04/02/2017    BMET    Component Value Date/Time   NA 140 04/02/2017 0814   K 3.9 04/02/2017 0814   CL 107 04/02/2017 0814   CO2 26 04/02/2017 0814   GLUCOSE 93 04/02/2017 0814   BUN 9 04/02/2017 0814   CREATININE 0.83 04/02/2017 0814   CALCIUM 9.0 04/02/2017 0814   GFRNONAA >60 04/02/2017 0814   GFRAA >60 04/02/2017 0814   Estimated Creatinine Clearance: 65 mL/min (by C-G formula based on SCr of 0.83 mg/dL).  COAG Lab Results  Component Value Date   INR 1.08 04/01/2017     Non-Invasive Vascular Imaging:  Carotid duplex completed.    Preliminary report:  Right - 1% to 39% ICA stenosis. Vertebral artery flow is antegrade. Left - Greater than 80% internal carotid artery stenosis. Vertebral artery  flow is antegrade.   Head CT IMPRESSION: Chronic white matter ischemic change. Lacunar infarcts are noted. No acute abnormality is seen.  MRI of head/neck IMPRESSION: 1. Patchy acute infarcts in the left greater than right cerebral hemispheres with distribution suggesting watershed ischemia. 2. Age advanced chronic Lentsch vessel ischemic  disease with numerous chronic infarcts as above. 3. Mild prominence of the pituitary gland for age. Consider laboratory correlation. 4. Advanced intracranial atherosclerosis as above including severe right and moderate left ICA stenoses, severe bilateral vertebral artery stenoses, and severe right P2 PCA stenosis. 5. Mild cervical disc degeneration without high-grade stenosis as above.  ASSESSMENT/PLAN:  Left ICA stenosis symptomatic CVA Patchy acute infarcts in the left greater than right cerebral hemispheres with distribution suggesting watershed ischemia. Right upper extremity weakness  Surgical intervention is recommended.  She will be scheduled for a left CEA by Dr. Donzetta [name] on Tuesday 04/08/2017.  We will need her BP better controlled < 223 systolic would be preferable and stable prior to surgery.      [name] [name] [facility] 04/03/2017 3:15 PM    I have interviewed patient with PA and agree with assessment and plan above. She has dense hemiparesis of rue with mri demonstrating watershed infarcts bilaterally left>right but unfortunately refused TEE today. Carotid duplex with severe stenosis on left. Will schedule for left CEA next Tuesday as first case but needs to have bp better controlled as post op goal will be <181mmHg. If there are not contraindications will get CT angio neck and head as part of preop workup given appearance of advanced intracranial ica stenosis with very high velocities on carotid duplex. Continue asa/plavix and statin.   [name] C. Donzetta Matters, MD [facility] [place] Office: [phone] Pager:  [phone]

## 2017-04-03 NOTE — Progress Notes (Signed)
PROGRESS NOTE    [name] [name]  OVZ:[id number] DOB: 06-02-1946 DOA: 04/01/2017 PCP: Patient, No Pcp Per    Brief Narrative:[name] [name] is a 71 y.o. female who presents with R arm weakness concerning for stroke. Neuro consulted and following. H/o CVA concerning and currently in a state of HTN emergency.   Assessment & Plan:   Principal Problem:   Stroke-like symptoms Active Problems:   Hypertensive emergency   Hypokalemia   Hyperglycemia   Stroke (cerebrum) (HCC)   Gait disturbance, post-stroke   Right arm weakness   Slowness and poor responsiveness   Stroke in the watershed distribution:  Admitted to telemetry, completed work up done.  Stroke team recommended TEE, loop recorder placement. But patient refused and its on hold .  PT eval recommended CIR. Currently awaiting insurance approval.  LDL is 130.  Currently on aspirin, plavix and lipitor.    Hypertensive urgency:  Permissive hypertension.    Hypokalemia: repleted.    DVT prophylaxis: (Lovenox/ Code Status: (Full) Family Communication: discussed with daughter at bedside.  Disposition Plan: CIR when stable.    Consultants:   Neurology / cardiology.    Procedures: echocardiogram.   Venous duplex negative. Marland Kitchen    Antimicrobials: none.    Subjective: No new complaints. Does not want TEE or loop recorder at this time.   Objective: Vitals:   04/02/17 2117 04/03/17 0100 04/03/17 0500 04/03/17 1007  BP: (!) 156/69 (!) 176/71 (!) 150/76 (!) 183/64  Pulse: 73 76 65 66  Resp: 20 20 20 18   Temp:  98.6 F (37 C) 97.8 F (36.6 C) 98.2 F (36.8 C)  TempSrc: Oral Oral Oral Oral  SpO2: 98% 99% 98% 100%  Weight:      Height:        Intake/Output Summary (Last 24 hours) at 04/03/17 1253 Last data filed at 04/03/17 1100  Gross per 24 hour  Intake              480 ml  Output                0 ml  Net              480 ml   Filed Weights   04/01/17 0910  Weight: 63.5 kg (140 lb)     Examination:  General exam: Appears calm and comfortable  Respiratory system: Clear to auscultation. Respiratory effort normal. Cardiovascular system: S1 & S2 heard, RRR. No JVD, murmurs, rubs, gallops or clicks. No pedal edema. Gastrointestinal system: Abdomen is nondistended, soft and nontender. No organomegaly or masses felt. Normal bowel sounds heard. Central nervous system: Alert and  Slightly confused, right arm strength 2/5.  Extremities: Symmetric 5 x 5 power. Skin: No rashes, lesions or ulcers     Data Reviewed: I have personally reviewed following labs and imaging studies  CBC:  Recent Labs Lab 04/01/17 0920 04/01/17 0932 04/02/17 0814  WBC 5.6  --  3.6*  NEUTROABS 4.4  --   --   HGB 13.5 13.9 14.8  HCT 42.2 41.0 45.9  MCV 80.7  --  81.2  PLT 161  --  412   Basic Metabolic Panel:  Recent Labs Lab 04/01/17 0920 04/01/17 0932 04/02/17 0814  NA 140 141 140  K 3.2* 3.2* 3.9  CL 106 104 107  CO2 24  --  26  GLUCOSE 119* 117* 93  BUN 9 9 9   CREATININE 0.81 0.80 0.83  CALCIUM 9.1  --  9.0   GFR:  Estimated Creatinine Clearance: 65 mL/min (by C-G formula based on SCr of 0.83 mg/dL). Liver Function Tests:  Recent Labs Lab 04/01/17 0920  AST 17  ALT 12*  ALKPHOS 68  BILITOT 0.9  PROT 6.7  ALBUMIN 3.7   No results for input(s): LIPASE, AMYLASE in the last 168 hours. No results for input(s): AMMONIA in the last 168 hours. Coagulation Profile:  Recent Labs Lab 04/01/17 0920  INR 1.08   Cardiac Enzymes:  Recent Labs Lab 04/01/17 1926  TROPONINI <0.03   BNP (last 3 results) No results for input(s): PROBNP in the last 8760 hours. HbA1C:  Recent Labs  04/02/17 0814  HGBA1C 5.7*   CBG: No results for input(s): GLUCAP in the last 168 hours. Lipid Profile:  Recent Labs  04/02/17 0814  CHOL 211*  HDL 69  LDLCALC 130*  TRIG 59  CHOLHDL 3.1   Thyroid Function Tests: No results for input(s): TSH, T4TOTAL, FREET4, T3FREE,  THYROIDAB in the last 72 hours. Anemia Panel: No results for input(s): VITAMINB12, FOLATE, FERRITIN, TIBC, IRON, RETICCTPCT in the last 72 hours. Sepsis Labs: No results for input(s): PROCALCITON, LATICACIDVEN in the last 168 hours.  Recent Results (from the past 240 hour(s))  Urine culture     Status: Abnormal   Collection Time: 04/01/17 12:16 PM  Result Value Ref Range Status   Specimen Description URINE, CATHETERIZED  Final   Special Requests Normal  Final   Culture MULTIPLE SPECIES PRESENT, SUGGEST RECOLLECTION (A)  Final   Report Status 04/02/2017 FINAL  Final         Radiology Studies: Mr Brain Wo Contrast  Result Date: 04/01/2017 CLINICAL DATA:  Right arm weakness and unsteady gait for 2 days. EXAM: MRI HEAD WITHOUT CONTRAST MRA HEAD WITHOUT CONTRAST MRI CERVICAL SPINE WITHOUT CONTRAST TECHNIQUE: Multiplanar, multiecho pulse sequences of the brain and surrounding structures were obtained without intravenous contrast. Angiographic images of the head were obtained using MRA technique without contrast. Multiplanar, multiecho pulse sequences of the cervical spine were obtained without intravenous contrast. COMPARISON:  Head CT 04/01/2017 FINDINGS: MRI HEAD FINDINGS Brain: There are numerous Fri[place] patchy acute infarcts predominantly sagittally oriented in the left cerebral hemisphere involving cortex and white matter with greatest involvement of the centrum semiovale suggestive of watershed ischemia. There are a few tiny acute infarcts in a similar distribution in the right cerebral hemisphere predominantly in the frontal and parietal lobes near the vertex. A 6 mm acute infarct is present in the genu of the corpus callosum. A chronic microhemorrhage is noted in the right thalamus. Patchy to confluent T2 hyperintensities in the cerebral white matter bilaterally are nonspecific but compatible with moderate chronic Mastel vessel ischemia, advanced for age. Chronic lacunar infarcts are present in  the cerebellum, bilateral basal ganglia, right internal capsule, and corpus callosum and deep cerebral white matter bilaterally. There is mild cerebral and moderate cerebellar atrophy. No mass, midline shift, or extra-axial fluid collection is seen. Mild to moderate chronic Celli vessel ischemic changes are present in the pons with a tiny chronic infarct noted. The pituitary gland is mildly prominent in size for age, measuring 8 mm in height. Vascular: Major intracranial vascular flow voids are preserved. Skull and upper cervical spine: Unremarkable bone marrow signal. Sinuses/Orbits: Unremarkable orbits. Paranasal sinuses and mastoid air cells are clear. Other: None. MRA HEAD FINDINGS The visualized distal vertebral arteries are patent to the basilar with the left being slightly larger than the right. There is diffuse V4 segment atherosclerotic irregularity with severe  stenoses bilaterally, greater on the left. AICAs appear patent proximally with a severe proximal stenosis on the right. SCA origins are patent bilaterally. The basilar artery is patent with mild irregularity but no significant stenosis. Posterior communicating arteries are not identified. PCAs are patent with a severe proximal P2 stenosis on the right and moderate to prominent branch vessel irregularity and narrowing bilaterally. The internal carotid arteries are patent from skullbase to carotid termini with advanced siphon atherosclerosis resulting in severe tandem cavernous stenoses on the right and moderate stenosis on the left. 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 The left A1 segment is patent with mild irregularity proximally but no significant stenosis and supplies the right ACA via the anterior communicating artery. There are severe A2 stenoses bilaterally. The MCAs are patent without evidence of significant M1 stenosis or proximal branch occlusion. There is moderate MCA branch vessel atherosclerotic irregularity  and narrowing bilaterally. No aneurysm is identified. MRI CERVICAL SPINE FINDINGS The study is mildly motion degraded. Alignment: Normal. Vertebrae: No evidence of fracture, osseous lesion, or significant marrow edema. Cord: Normal signal and morphology. Posterior Fossa, vertebral arteries, paraspinal tissues: Cerebral atrophy and infarcts as described above. Preserved vertebral artery flow voids in the neck. Disc levels: C2-3:  Negative. C3-4: Disc bulging and uncovertebral spurring result in mild right neural foraminal stenosis without spinal stenosis. C4-5: Minimal disc bulging and uncovertebral spurring without significant stenosis. C5-6:  Minimal disc bulging without stenosis. C6-7: Vaile central disc extrusion with slight caudal migration without stenosis or significant spinal cord mass effect. C7-T1: Warr right paracentral disc protrusion without significant stenosis. IMPRESSION: 1. Patchy acute infarcts in the left greater than right cerebral hemispheres with distribution suggesting watershed ischemia. 2. Age advanced chronic Waites vessel ischemic disease with numerous chronic infarcts as above. 3. Mild prominence of the pituitary gland for age. Consider laboratory correlation. 4. Advanced intracranial atherosclerosis as above including severe right and moderate left ICA stenoses, severe bilateral vertebral artery stenoses, and severe right P2 PCA stenosis. 5. Mild cervical disc degeneration without high-grade stenosis as above. Electronically Signed   By: Logan Bores M.D.   On: 04/01/2017 17:15   Mr Chest W Wo Contrast  Result Date: 04/02/2017 CLINICAL DATA:  Right arm weakness and unsteady gait for 2 days. EXAM: MRI BRACHIAL PLEXUS WITHOUT CONTRAST TECHNIQUE: Multiplanar, multiecho pulse sequences of the neck and surrounding structures were obtained without intravenous contrast. The field of view was focused on the right brachial plexus from the neural foramina to the axilla. CONTRAST:  46mL  MULTIHANCE GADOBENATE DIMEGLUMINE 529 MG/ML IV SOLN COMPARISON:  None. FINDINGS: Despite efforts by the technologist and patient, motion artifact is present on today's exam and could not be eliminated. This reduces exam sensitivity and specificity. Spinal cord No specific abnormality were visualized; todays exam was not a dedicated study of the cervical or thoracic spine. Brachial plexus: No appreciable abnormal edema, mass effect, or other specific abnormality along the components of the right brachial plexus. 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 Other findings No supplemental non-categorized findings. IMPRESSION: 1. No significant abnormality of the brachial plexus is identified. 2. Cervical and thoracic spondylosis. 3. 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 Electronically Signed   By: Van Clines M.D.   On: 04/02/2017 07:10   Mr Cervical Spine Wo Contrast  Result Date: 04/01/2017 CLINICAL DATA:  Right arm weakness and unsteady gait for 2 days.  EXAM: MRI HEAD WITHOUT CONTRAST MRA HEAD WITHOUT CONTRAST MRI CERVICAL SPINE WITHOUT CONTRAST TECHNIQUE: Multiplanar, multiecho pulse sequences of the brain and surrounding structures were obtained without intravenous contrast. Angiographic images of the head were obtained using MRA technique without contrast. Multiplanar, multiecho pulse sequences of the cervical spine were obtained without intravenous contrast. COMPARISON:  Head CT 04/01/2017 FINDINGS: MRI HEAD FINDINGS Brain: There are numerous Mateus patchy acute infarcts predominantly sagittally oriented in the left cerebral hemisphere involving cortex and white matter with greatest involvement of the centrum semiovale suggestive of watershed ischemia. There are a few tiny acute infarcts in a similar distribution in the right cerebral hemisphere predominantly in the frontal and  parietal lobes near the vertex. A 6 mm acute infarct is present in the genu of the corpus callosum. A chronic microhemorrhage is noted in the right thalamus. Patchy to confluent T2 hyperintensities in the cerebral white matter bilaterally are nonspecific but compatible with moderate chronic S[place]ert vessel ischemia, advanced for age. Chronic lacunar infarcts are present in the cerebellum, bilateral basal ganglia, right internal capsule, and corpus callosum and deep cerebral white matter bilaterally. There is mild cerebral and moderate cerebellar atrophy. No mass, midline shift, or extra-axial fluid collection is seen. Mild to moderate chronic Annas vessel ischemic changes are present in the pons with a tiny chronic infarct noted. The pituitary gland is mildly prominent in size for age, measuring 8 mm in height. Vascular: Major intracranial vascular flow voids are preserved. Skull and upper cervical spine: Unremarkable bone marrow signal. Sinuses/Orbits: Unremarkable orbits. Paranasal sinuses and mastoid air cells are clear. Other: None. MRA HEAD FINDINGS The visualized distal vertebral arteries are patent to the basilar with the left being slightly larger than the right. There is diffuse V4 segment atherosclerotic irregularity with severe stenoses bilaterally, greater on the left. AICAs appear patent proximally with a severe proximal stenosis on the right. SCA origins are patent bilaterally. The basilar artery is patent with mild irregularity but no significant stenosis. Posterior communicating arteries are not identified. PCAs are patent with a severe proximal P2 stenosis on the right and moderate to prominent branch vessel irregularity and narrowing bilaterally. The internal carotid arteries are patent from skullbase to carotid termini with advanced siphon atherosclerosis resulting in severe tandem cavernous stenoses on the right and moderate stenosis on the left. 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 The left A1 segment is patent with mild irregularity proximally but no significant stenosis and supplies the right ACA via the anterior communicating artery. There are severe A2 stenoses bilaterally. The MCAs are patent without evidence of significant M1 stenosis or proximal branch occlusion. There is moderate MCA branch vessel atherosclerotic irregularity and narrowing bilaterally. No aneurysm is identified. MRI CERVICAL SPINE FINDINGS The study is mildly motion degraded. Alignment: Normal. Vertebrae: No evidence of fracture, osseous lesion, or significant marrow edema. Cord: Normal signal and morphology. Posterior Fossa, vertebral arteries, paraspinal tissues: Cerebral atrophy and infarcts as described above. Preserved vertebral artery flow voids in the neck. Disc levels: C2-3:  Negative. C3-4: Disc bulging and uncovertebral spurring result in mild right neural foraminal stenosis without spinal stenosis. C4-5: Minimal disc bulging and uncovertebral spurring without significant stenosis. C5-6:  Minimal disc bulging without stenosis. C6-7: Bry central disc extrusion with slight caudal migration without stenosis or significant spinal cord mass effect. C7-T1: Hizer right paracentral disc protrusion without significant stenosis. IMPRESSION: 1. Patchy acute infarcts in the left greater than right cerebral hemispheres with distribution suggesting watershed ischemia.  2. Age advanced chronic Ismael vessel ischemic disease with numerous chronic infarcts as above. 3. Mild prominence of the pituitary gland for age. Consider laboratory correlation. 4. Advanced intracranial atherosclerosis as above including severe right and moderate left ICA stenoses, severe bilateral vertebral artery stenoses, and severe right P2 PCA stenosis. 5. Mild cervical disc degeneration without high-grade stenosis as above. Electronically Signed   By: Logan Bores M.D.   On: 04/01/2017 17:15   Mr [name] [name] Head/brain FO Cm  Result  Date: 04/01/2017 CLINICAL DATA:  Right arm weakness and unsteady gait for 2 days. EXAM: MRI HEAD WITHOUT CONTRAST MRA HEAD WITHOUT CONTRAST MRI CERVICAL SPINE WITHOUT CONTRAST TECHNIQUE: Multiplanar, multiecho pulse sequences of the brain and surrounding structures were obtained without intravenous contrast. Angiographic images of the head were obtained using MRA technique without contrast. Multiplanar, multiecho pulse sequences of the cervical spine were obtained without intravenous contrast. COMPARISON:  Head CT 04/01/2017 FINDINGS: MRI HEAD FINDINGS Brain: There are numerous [place] patchy acute infarcts predominantly sagittally oriented in the left cerebral hemisphere involving cortex and white matter with greatest involvement of the centrum semiovale suggestive of watershed ischemia. There are a few tiny acute infarcts in a similar distribution in the right cerebral hemisphere predominantly in the frontal and parietal lobes near the vertex. A 6 mm acute infarct is present in the genu of the corpus callosum. A chronic microhemorrhage is noted in the right thalamus. Patchy to confluent T2 hyperintensities in the cerebral white matter bilaterally are nonspecific but compatible with moderate chronic [place] vessel ischemia, advanced for age. Chronic lacunar infarcts are present in the cerebellum, bilateral basal ganglia, right internal capsule, and corpus callosum and deep cerebral white matter bilaterally. There is mild cerebral and moderate cerebellar atrophy. No mass, midline shift, or extra-axial fluid collection is seen. Mild to moderate chronic Sc[place]ie vessel ischemic changes are present in the pons with a tiny chronic infarct noted. The pituitary gland is mildly prominent in size for age, measuring 8 mm in height. Vascular: Major intracranial vascular flow voids are preserved. Skull and upper cervical spine: Unremarkable bone marrow signal. Sinuses/Orbits: Unremarkable orbits. Paranasal sinuses and mastoid air  cells are clear. Other: None. MRA HEAD FINDINGS The visualized distal vertebral arteries are patent to the basilar with the left being slightly larger than the right. There is diffuse V4 segment atherosclerotic irregularity with severe stenoses bilaterally, greater on the left. AICAs appear patent proximally with a severe proximal stenosis on the right. SCA origins are patent bilaterally. The basilar artery is patent with mild irregularity but no significant stenosis. Posterior communicating arteries are not identified. PCAs are patent with a severe proximal P2 stenosis on the right and moderate to prominent branch vessel irregularity and narrowing bilaterally. The internal carotid arteries are patent from skullbase to carotid termini with advanced siphon atherosclerosis resulting in severe tandem cavernous stenoses on the right and moderate stenosis on the left. 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 The left A1 segment is patent with mild irregularity proximally but no significant stenosis and supplies the right ACA via the anterior communicating artery. There are severe A2 stenoses bilaterally. The MCAs are patent without evidence of significant M1 stenosis or proximal branch occlusion. There is moderate MCA branch vessel atherosclerotic irregularity and narrowing bilaterally. No aneurysm is identified. MRI CERVICAL SPINE FINDINGS The study is mildly motion degraded. Alignment: Normal. Vertebrae: No evidence of fracture, osseous lesion, or significant marrow edema. Cord: Normal signal and morphology. Posterior Fossa, vertebral arteries, paraspinal  tissues: Cerebral atrophy and infarcts as described above. Preserved vertebral artery flow voids in the neck. Disc levels: C2-3:  Negative. C3-4: Disc bulging and uncovertebral spurring result in mild right neural foraminal stenosis without spinal stenosis. C4-5: Minimal disc bulging and uncovertebral spurring without significant stenosis.  C5-6:  Minimal disc bulging without stenosis. C6-7: [place] central disc extrusion with slight caudal migration without stenosis or significant spinal cord mass effect. C7-T1: Muh[place]auer right paracentral disc protrusion without significant stenosis. IMPRESSION: 1. Patchy acute infarcts in the left greater than right cerebral hemispheres with distribution suggesting watershed ischemia. 2. Age advanced chronic Bais vessel ischemic disease with numerous chronic infarcts as above. 3. Mild prominence of the pituitary gland for age. Consider laboratory correlation. 4. Advanced intracranial atherosclerosis as above including severe right and moderate left ICA stenoses, severe bilateral vertebral artery stenoses, and severe right P2 PCA stenosis. 5. Mild cervical disc degeneration without high-grade stenosis as above. Electronically Signed   By: Logan Bores M.D.   On: 04/01/2017 17:15        Scheduled Meds: . aspirin  325 mg Oral Daily  . atorvastatin  80 mg Oral q1800  . clopidogrel  75 mg Oral Daily  . enoxaparin (LOVENOX) injection  40 mg Subcutaneous Q24H  . sodium chloride flush  3 mL Intravenous Q12H   Continuous Infusions: . sodium chloride    . sodium chloride 10 mL/hr at 04/01/17 1435     LOS: 1 day    Time spent: 35 minutes.     Hosie Poisson, MD [facility] Pager [phone]  If 7PM-7AM, please contact night-coverage [email or web address] Password [facility] 04/03/2017, 12:53 PM

## 2017-04-03 NOTE — Progress Notes (Signed)
VASCULAR LAB PRELIMINARY  PRELIMINARY  PRELIMINARY  PRELIMINARY  Bilateral lower extremity venous duplex completed.    Preliminary report:  Bilateral:  No evidence of DVT, superficial thrombosis, or Baker's Cyst.   Sweden Lesure, RVS 04/03/2017, 9:48 AM

## 2017-04-03 NOTE — Progress Notes (Signed)
.  VASCULAR LAB PRELIMINARY  PRELIMINARY  PRELIMINARY  PRELIMINARY  Carotid duplex completed.    Preliminary report:  Right - 1% to 39% ICA stenosis. Vertebral artery flow is antegrade. Left - Greater than 80% internal carotid artery stenosis. Vertebral artery flow is antegrade.   [name] [name], RVS 04/03/2017, 9:44 AM

## 2017-04-03 NOTE — Progress Notes (Signed)
Inpatient Rehabilitation  Met with patient and daughter to discuss team's recommendation for IP Rehab.  Shared booklets and answered questions.  Patient is in favor of IP Rehab for her post acute therapies in order to regain as much independence as possible.  Insurance authorization initiated.  Plan to follow along for timing of medical readiness, insurance authorization, and bed availability.  Please call with questions.   Carmelia Roller., CCC/SLP Admission Coordinator  [place]  Cell [phone]

## 2017-04-04 ENCOUNTER — Inpatient Hospital Stay (HOSPITAL_COMMUNITY): Payer: Medicare Other

## 2017-04-04 DIAGNOSIS — I69398 Other sequelae of cerebral infarction: Secondary | ICD-10-CM

## 2017-04-04 DIAGNOSIS — R269 Unspecified abnormalities of gait and mobility: Secondary | ICD-10-CM

## 2017-04-04 DIAGNOSIS — E785 Hyperlipidemia, unspecified: Secondary | ICD-10-CM

## 2017-04-04 MED ORDER — AMLODIPINE BESYLATE 2.5 MG PO TABS
2.5000 mg | ORAL_TABLET | Freq: Every day | ORAL | Status: DC
  Administered 2017-04-05 – 2017-04-09 (×4): 2.5 mg via ORAL
  Filled 2017-04-04 (×4): qty 1

## 2017-04-04 MED ORDER — IOPAMIDOL (ISOVUE-370) INJECTION 76%
INTRAVENOUS | Status: AC
  Administered 2017-04-04: 50 mL via INTRAVENOUS
  Filled 2017-04-04: qty 50

## 2017-04-04 NOTE — Progress Notes (Addendum)
Inpatient Rehabilitation  Continuing to follow along for timing of medical readiness.  I have insurance authorization; however, note plans for CEA surgery next Tuesday.  I have spoken with [employer] case manager and authorization is good until 04/10/17.  As a result, we plan to follow up with [employer] next week with hopeful admission after surgery.  Please call with questions.   Carmelia Roller., CCC/SLP Admission Coordinator  [place]  Cell [phone]

## 2017-04-04 NOTE — Progress Notes (Signed)
Occupational Therapy Treatment Patient Details Name: [name] [name] MRN: [id number] DOB: 12/12/1945 Today's Date: 04/04/2017    History of present illness Patient is a 71 yo female admitted 04/01/17 with RUE weakness and unsteady gait for several days.  Patient also with hypertensive urgency and hyperglycemia.   MRI of brain - Patchy acute infarcts in the left greater than right cerebral hemispheres with distribution suggesting watershed ischemia.     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 Focus of session was education for exercise program (using LUE to assist with ROM for RUE) and functional transfers. 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 OT noticed some movement of R shoulder during seated functional grooming task. Pt and daughting asking about details of CIR and what can be expected. Strongly feel that Pt continues to be an excellent candidate for CIR as she is able to tolerate intense therapy and is motivated to return to PLOF (independent) has good support in her daughter. Next session follow up with RUE HEP and continue functional tasks to engage RUE.  Follow Up Recommendations  CIR;Supervision/Assistance - 24 hour    Equipment Recommendations  Other (comment) (defer to next venue)    Recommendations for Other Services Rehab consult    Precautions / Restrictions Precautions Precautions: Fall Precaution Comments: Rt hemiparesis Restrictions Weight Bearing Restrictions: No       Mobility Bed Mobility Overal bed mobility: Needs Assistance Bed Mobility: Supine to Sit     Supine to sit: Min assist;HOB elevated     General bed mobility comments: Pt sitting OOB in recliner when OT arrived in the room  Transfers Overall transfer level: Needs assistance Equipment used: 1 person hand held assist Transfers: Sit to/from Stand Sit to Stand: Min assist         General transfer  comment: Heavy min assist for balance support and safety. Placed R hand on arm rest of the chair to stand and pt was able to push up and put some weight through it.    Balance Overall balance assessment: Needs assistance Sitting-balance support: Single extremity supported;Feet supported Sitting balance-Leahy Scale: Fair     Standing balance support: Single extremity supported Standing balance-Leahy Scale: Poor Standing balance comment: Required use of LUE and mod A to maintain balance during functional mobility                            ADL either performed or assessed with clinical judgement   ADL Overall ADL's : Needs assistance/impaired     Grooming: Moderate assistance;Sitting Grooming Details (indicate cue type and reason): in recliner Upper Body Bathing: Moderate assistance;Sitting Upper Body Bathing Details (indicate cue type and reason): using wash cloth, HOH with RUE             Toilet Transfer: Minimal assistance;Ambulation;Comfort height toilet (1 HHA) Toilet Transfer Details (indicate cue type and reason): when RUE is positioned prior to transfer, Pt will bear weight through it         Functional mobility during ADLs: Minimal assistance (1 HHA) General ADL Comments: increased assist to complete ADLs secondary to R hemiparesis      Vision       Perception     Praxis      Cognition Arousal/Alertness: Awake/alert Behavior During Therapy: Flat affect Overall Cognitive Status: Impaired/Different from baseline Area of Impairment: Following commands;Awareness;Problem  solving;Attention                   Current Attention Level: Sustained   Following Commands: Follows one step commands inconsistently;Follows one step commands with increased time   Awareness: Emergent Problem Solving: Difficulty sequencing;Requires verbal cues;Requires tactile cues;Slow processing;Decreased initiation General Comments: When performing seated ADL, Pt  struggled with problem solving how to do tasks that were typically 2 handed tasks with one hand        Exercises Other Exercises Other Exercises: Used LUE to lift RUE X 10; verbal/manual cuesfor appropriate sequencing and technique.  Other Exercises: Used LUE to perform elbow ROM in seated position. Verbal and manual cues provided.  Other Exercises: Used LUE to perform ROM with digits. Verbal and manual cues provided   Shoulder Instructions       General Comments [name] present for session    Pertinent Vitals/ Pain       Pain Assessment: No/denies pain  Home Living                                          Prior Functioning/Environment              Frequency  Min 3X/week        Progress Toward Goals  OT Goals(current goals can now be found in the care plan section)  Progress towards OT goals: Progressing toward goals  Acute Rehab OT Goals Patient Stated Goal: To walk; get use of RUE back OT Goal Formulation: With patient Time For Goal Achievement: 04/16/17 Potential to Achieve Goals: Good  Plan Discharge plan remains appropriate;Frequency remains appropriate    Co-evaluation                 AM-PAC PT "6 Clicks" Daily Activity     Outcome Measure   Help from another person eating meals?: A Little Help from another person taking care of personal grooming?: A Little Help from another person toileting, which includes using toliet, bedpan, or urinal?: A Lot Help from another person bathing (including washing, rinsing, drying)?: A Lot Help from another person to put on and taking off regular upper body clothing?: A Little Help from another person to put on and taking off regular lower body clothing?: A Lot 6 Click Score: 15    End of Session Equipment Utilized During Treatment: Gait belt  OT Visit Diagnosis: Hemiplegia and hemiparesis;Other abnormalities of gait and mobility (R26.89);Unsteadiness on feet (R26.81);Muscle weakness  (generalized) (M62.81);Cognitive communication deficit (R41.841) Symptoms and signs involving cognitive functions: Cerebral infarction Hemiplegia - Right/Left: Right Hemiplegia - dominant/non-dominant: Dominant Hemiplegia - caused by: Cerebral infarction   Activity Tolerance Patient tolerated treatment well   Patient Left in chair;with call bell/phone within reach;with family/visitor present;with chair alarm set   Nurse Communication Mobility status        Time: 6468-0321 OT Time Calculation (min): 30 min  Charges: OT General Charges $OT Visit: 1 Procedure OT Treatments $Self Care/Home Management : 8-22 mins $Therapeutic Exercise: 8-22 mins  Hulda Humphrey OTR/L [phone]  Merri Ray Ademola Vert 04/04/2017, 4:23 PM

## 2017-04-04 NOTE — Progress Notes (Addendum)
Physical Therapy Treatment Patient Details Name: [name] [name] MRN: [id number] DOB: Nov 11, 1946 Today's Date: 04/04/2017    History of Present Illness Patient is a 71 yo female admitted 04/01/17 with RUE weakness and unsteady gait for several days.  Patient also with hypertensive urgency and hyperglycemia.   MRI of brain - Patchy acute infarcts in the left greater than right cerebral hemispheres with distribution suggesting watershed ischemia.     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 Was able to progress ambulation distance in hall this session, however quality of steps was not able to be changed with cues. Motor apraxia limiting therapeutic exercise at this time. Pt appears to be able to do more with the R side when it is within a functional task. Daughter present at end of session and asking appropriate questions regarding progress and plans for d/c and rehab options. Will continue to follow and progress as able per POC.   Follow Up Recommendations  CIR;Supervision for mobility/OOB     Equipment Recommendations  None recommended by PT (TBD by next venue of care)    Recommendations for Other Services Rehab consult     Precautions / Restrictions Precautions Precautions: Fall Precaution Comments: Rt hemiparesis Restrictions Weight Bearing Restrictions: No    Mobility  Bed Mobility Overal bed mobility: Needs Assistance Bed Mobility: Supine to Sit     Supine to sit: Min assist;HOB elevated     General bed mobility comments: Assist for trunk elevation to full sitting position. Bed pad used to complete scooting to edge of bed and get both feet on the floor.   Transfers Overall transfer level: Needs assistance Equipment used: 1 person hand held assist Transfers: Sit to/from Stand Sit to Stand: Min assist         General transfer comment: Heavy min assist for balance support and safety. Placed R hand on arm rest of the  chair to stand and pt was able to push up and put some weight through it.  Ambulation/Gait Ambulation/Gait assistance: Min assist;+2 safety/equipment Ambulation Distance (Feet): 150 Feet Assistive device: 1 person hand held assist Gait Pattern/deviations: Step-through pattern;Decreased stride length;Shuffle;Decreased dorsiflexion - right;Decreased step length - right Gait velocity: Decreased Gait velocity interpretation: Below normal speed for age/gender General Gait Details: VC's for sequencing, improved posture, increased heel strike on R and increased step length on R. Pt unable to make corrective changes, and reports to therapist that she knows she isn't "doing it right" but does not know why she can't change what she's doing to correct it. Attempted standing side steps at the sink. Pt was not able to complete without Max assist at the RLE for movement, however pt had essentially done this activity in standing earlier in the session. Chair follow utilized in the hallway as well as railing support on the L side. Attempted SPC use but pt was barely placing on the ground and not providing a significant amount of support for the patient.    Stairs            Wheelchair Mobility    Modified Rankin (Stroke Patients Only) Modified Rankin (Stroke Patients Only) Pre-Morbid Rankin Score: No symptoms Modified Rankin: Moderately severe disability     Balance Overall balance assessment: Needs assistance Sitting-balance support: Single extremity supported;Feet supported Sitting balance-Leahy Scale: Fair     Standing balance support: Single extremity supported Standing balance-Leahy Scale: Poor Standing balance comment: Required use of LUE  and mod A to maintain balance during functional mobility                             Cognition Arousal/Alertness: Awake/alert Behavior During Therapy: Flat affect Overall Cognitive Status: Impaired/Different from baseline Area of  Impairment: Following commands;Awareness;Problem solving;Attention                   Current Attention Level: Sustained   Following Commands: Follows one step commands inconsistently;Follows one step commands with increased time   Awareness: Emergent Problem Solving: Difficulty sequencing;Requires verbal cues;Requires tactile cues;Slow processing;Decreased initiation        Exercises      General Comments        Pertinent Vitals/Pain Pain Assessment: No/denies pain    Home Living                      Prior Function            PT Goals (current goals can now be found in the care plan section) Acute Rehab PT Goals Patient Stated Goal: To walk; gain strength back in RUE PT Goal Formulation: With patient/family Time For Goal Achievement: 04/08/17 Potential to Achieve Goals: Good Progress towards PT goals: Progressing toward goals    Frequency    Min 4X/week      PT Plan Current plan remains appropriate    Co-evaluation              AM-PAC PT "6 Clicks" Daily Activity  Outcome Measure  Difficulty turning over in bed (including adjusting bedclothes, sheets and blankets)?: Total Difficulty moving from lying on back to sitting on the side of the bed? : Total Difficulty sitting down on and standing up from a chair with arms (e.g., wheelchair, bedside commode, etc,.)?: Total Help needed moving to and from a bed to chair (including a wheelchair)?: A Little Help needed walking in hospital room?: A Lot Help needed climbing 3-5 steps with a railing? : Total 6 Click Score: 9    End of Session Equipment Utilized During Treatment: Gait belt Activity Tolerance: Patient tolerated treatment well Patient left: in chair;with call bell/phone within reach;with chair alarm set Nurse Communication: Mobility status PT Visit Diagnosis: Unsteadiness on feet (R26.81);Other abnormalities of gait and mobility (R26.89);Hemiplegia and hemiparesis;Other symptoms and  signs involving the nervous system (R29.898) Hemiplegia - Right/Left: Right Hemiplegia - dominant/non-dominant: Dominant Hemiplegia - caused by: Cerebral infarction     Time: 9211-9417 PT Time Calculation (min) (ACUTE ONLY): 29 min  Charges:  $Gait Training: 23-37 mins                    G Codes:       Rolinda Roan, PT, DPT Acute Rehabilitation Services Pager: [place] 04/04/2017, 3:04 PM

## 2017-04-04 NOTE — Progress Notes (Signed)
OT Cancellation Note  Patient Details Name: [name] [name] MRN: [id number] DOB: 1945/12/04   Cancelled Treatment:    Reason Eval/Treat Not Completed: Patient at procedure or test/ unavailable. Pt off the floor at a procedure.  OT will check back as schedule allows.  Merri Ray Kinte Trim 04/04/2017, 11:35 AM  Hulda Humphrey OTR/L [phone]

## 2017-04-04 NOTE — Evaluation (Signed)
Speech Language Pathology Evaluation Patient Details Name: [name] [name] MRN: [id number] DOB: 06-Oct-1946 Today's Date: 04/04/2017 Time: 4098-1191 SLP Time Calculation (min) (ACUTE ONLY): 18 min  Problem List:  Patient Active Problem List   Diagnosis Date Noted  . Stenosis of left carotid artery   . Hyperlipidemia   . Gait disturbance, post-stroke 04/02/2017  . Right arm weakness   . Slowness and poor responsiveness   . Hypertensive emergency 04/01/2017  . Hypokalemia 04/01/2017  . Hyperglycemia 04/01/2017  . Stroke-like symptoms 04/01/2017  . Stroke (cerebrum) ([place]) 04/01/2017   Past Medical History:  Past Medical History:  Diagnosis Date  . Stroke [facility])    Past Surgical History: History reviewed. No pertinent surgical history. HPI:  Patient is a 71 yo female admitted 04/01/17 with RUE weakness and unsteady gait for several days. Patient also with hypertensive urgency and hyperglycemia. MRI of brain - Patchy acute infarcts in the left greater than right cerebral hemispheres with distribution suggesting watershed ischemia. 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 During assessment, pt noted with flat affect, inappropriate eye contact, reduced initiation and need for extended processing time. 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 When asked about her perception of changes since stroke, she requires cuing to state, "I guess I'm a little slower." She does not state physical impairments without direct questioning, does not recall prior physical or occupational therapy evaluations or recommendations, though she does state, "I'm going to rehab."  Task abandoment/poor initiation with delayed recall (0/3), serial subtraction (0/5) and alternate task of spelling backwards ("No."). Recommend CIR to address above cognitive communication impairments. SLP will follow acutely.    SLP Assessment  SLP Recommendation/Assessment: Patient needs continued Speech Lanaguage Pathology Services SLP Visit Diagnosis: Attention and concentration deficit;Cognitive communication deficit (R41.841) Attention and concentration deficit following: Cerebral infarction    Follow Up Recommendations  Inpatient Rehab    Frequency and Duration min 1 x/week  2 weeks      SLP Evaluation Cognition  Overall Cognitive Status: Impaired/Different from baseline Arousal/Alertness: Awake/alert Orientation Level: Oriented to person;Disoriented to time;Oriented to situation;Oriented to place Attention: Sustained Sustained Attention: Impaired Sustained Attention Impairment: Verbal basic;Functional basic Memory: Impaired Memory Impairment: Storage deficit;Decreased recall of new information;Decreased short term memory Decreased Short Term Memory: Verbal basic;Functional basic Awareness: Impaired Awareness Impairment: Intellectual impairment Problem Solving: Impaired Problem Solving Impairment: Verbal basic;Functional basic Executive Function: Reasoning;Sequencing;Initiating Reasoning: Impaired Reasoning Impairment: Verbal basic;Functional basic Sequencing: Impaired Sequencing Impairment: Verbal basic;Functional basic Initiating: Impaired Initiating Impairment: Verbal basic;Functional basic Safety/Judgment: Impaired Comments: reduced awareness/insight       Comprehension  Auditory Comprehension Overall Auditory Comprehension: Impaired Yes/No Questions: Within Functional Limits Commands: Impaired One Step Basic Commands: 75-100% accurate Two Step Basic Commands: 75-100% accurate Multistep Basic Commands: 50-74% accurate Conversation: Simple Other Conversation  Comments: requires extended processing time Interfering Components: Attention;Processing speed;Working Field seismologist: Barista: Not tested Reading Comprehension Reading Status: Not tested    Expression Expression Primary Mode of Expression: Verbal Verbal Expression Overall Verbal Expression: Impaired Initiation: Impaired Automatic Speech: Name;Social Response Level of Generative/Spontaneous Verbalization: Sentence Repetition: No impairment Naming: No impairment Pragmatics: Impairment Impairments: Abnormal affect;Dysprosody;Eye contact;Monotone Interfering Components: Attention Non-Verbal Means of Communication: Not applicable Written Expression Dominant  Hand: Right Written Expression: Unable to assess (comment) (pt right handed, unable to hold pen)   Oral / Motor  Oral Motor/Sensory Function Overall Oral Motor/Sensory Function: Within functional limits Motor Speech Overall Motor Speech: Appears within functional limits for tasks assessed   [place], [place], [place] 04/04/2017, 10:06 AM

## 2017-04-04 NOTE — Progress Notes (Signed)
  Progress Note    04/04/2017 8:34 AM * No surgery date entered *  Subjective:  No new complaints  Vitals:   04/04/17 0002 04/04/17 0455  BP: (!) 176/69 (!) 169/73  Pulse: 62 65  Resp: 16 16  Temp: 97.8 F (36.6 C) 97.6 F (36.4 C)    Physical Exam: [email or web address] Cannot move right arm Other extremities are sensorimotor in tact  CBC    Component Value Date/Time   WBC 3.6 (L) 04/02/2017 0814   RBC 5.65 (H) 04/02/2017 0814   HGB 14.8 04/02/2017 0814   HCT 45.9 04/02/2017 0814   PLT 172 04/02/2017 0814   MCV 81.2 04/02/2017 0814   MCH 26.2 04/02/2017 0814   MCHC 32.2 04/02/2017 0814   RDW 14.2 04/02/2017 0814   LYMPHSABS 0.8 04/01/2017 0920   MONOABS 0.3 04/01/2017 0920   EOSABS 0.1 04/01/2017 0920   BASOSABS 0.0 04/01/2017 0920    BMET    Component Value Date/Time   NA 140 04/02/2017 0814   K 3.9 04/02/2017 0814   CL 107 04/02/2017 0814   CO2 26 04/02/2017 0814   GLUCOSE 93 04/02/2017 0814   BUN 9 04/02/2017 0814   CREATININE 0.83 04/02/2017 0814   CALCIUM 9.0 04/02/2017 0814   GFRNONAA >60 04/02/2017 0814   GFRAA >60 04/02/2017 0814    INR    Component Value Date/Time   INR 1.08 04/01/2017 0920     Intake/Output Summary (Last 24 hours) at 04/04/17 0834 Last data filed at 04/03/17 1100  Gross per 24 hour  Intake              120 ml  Output                0 ml  Net              120 ml     Assessment:  71 y.o. female is s/p bilateral watershed cva, left > right.   Plan: CT angio head and neck to evaluate extent of vascular disease Asa/plavix/statin Tentatively planning left CEA next Tuesday morning pending results of CTA   Ledonna Dormer C. Donzetta Matters, MD [facility] Office: [phone] Pager: [phone]  04/04/2017 8:34 AM

## 2017-04-04 NOTE — Progress Notes (Signed)
PROGRESS NOTE    [name] [name]  WFU:[id number] DOB: August 10, 1946 DOA: 04/01/2017 PCP: Patient, No Pcp Per    Brief Narrative:[name] [name] is a 71 y.o. female who presents with R arm weakness concerning for stroke. Neuro consulted and following. H/o CVA concerning and currently in a state of HTN emergency.   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  Stroke team recommended TEE, loop recorder placement. But patient refused.  PT eval recommended CIR.  LDL is 130. hgba1c is 5.7.  Currently on aspirin, plavix and lipitor. Recommend 3 months of DAPT , followed by plavix.  Carotid duplex showing stenosis on the LEFT ICA. Vascular consulted, and plan for CTA and possible surgery next week.    Hypertensive urgency:  Permissive hypertension.upto 72 hours.  Start her low dose amlodipine.    Hypokalemia: repleted.    DVT prophylaxis: (Lovenox/ Code Status: (Full) Family Communication: sister at bedside.  Disposition Plan: CIR when stable.    Consultants:   Neurology / cardiology.   Vascular consult   Procedures: echocardiogram.   Venous duplex of the lower extremities negative for DVT  Carotid duplex showed left ICA stenosis upto 90%  CTA pending. . .    Antimicrobials: none.    Subjective: Pleasant, no complaints.  Objective: Vitals:   04/03/17 2300 04/04/17 0002 04/04/17 0455 04/04/17 0936  BP:  (!) 176/69 (!) 169/73 (!) 163/66  Pulse:  62 65 80  Resp:  16 16 18   Temp: 97.8 F (36.6 C) 97.8 F (36.6 C) 97.6 F (36.4 C) 98 F (36.7 C)  TempSrc:  Oral Oral Oral  SpO2:  100% 100% 97%  Weight:      Height:        No intake or output data in the 24 hours ending 04/04/17 1125 Filed Weights   04/01/17 0910  Weight: 63.5 kg (140 lb)    Examination:  General exam: Appears calm and comfortable  Respiratory system: Clear to auscultation. Respiratory effort normal. Cardiovascular system: S1 & S2 heard, RRR. No JVD, murmurs, rubs, gallops or clicks. No pedal edema. Gastrointestinal system: Abdomen is nondistended, soft and nontender. No organomegaly or masses felt. Normal bowel sounds heard. Central nervous system: Alert and   confused, right arm strength 2/5.  Extremities: Symmetric 5 x 5 power. Skin: No rashes, lesions or ulcers     Data Reviewed: I have personally reviewed following labs and imaging studies  CBC:  Recent Labs Lab 04/01/17 0920 04/01/17 0932 04/02/17 0814  WBC 5.6  --  3.6*  NEUTROABS 4.4  --   --   HGB 13.5 13.9 14.8  HCT 42.2 41.0 45.9  MCV 80.7  --  81.2  PLT 161  --  202   Basic Metabolic Panel:  Recent Labs Lab 04/01/17 0920 04/01/17 0932 04/02/17 0814  NA 140 141 140  K 3.2* 3.2* 3.9  CL 106 104 107  CO2 24  --  26  GLUCOSE 119* 117* 93  BUN 9 9 9   CREATININE 0.81 0.80 0.83  CALCIUM 9.1  --  9.0   GFR: Estimated Creatinine Clearance: 65 mL/min (by C-G formula based on SCr  of 0.83 mg/dL). Liver Function Tests:  Recent Labs Lab 04/01/17 0920  AST 17  ALT 12*  ALKPHOS 68  BILITOT 0.9  PROT 6.7  ALBUMIN 3.7   No results for input(s): LIPASE, AMYLASE in the last 168 hours. No results for input(s): AMMONIA in the last 168 hours. Coagulation Profile:  Recent Labs Lab 04/01/17 0920  INR 1.08   Cardiac Enzymes:  Recent Labs Lab 04/01/17 1926  TROPONINI <0.03   BNP (last 3 results) No results for input(s): PROBNP in the last 8760 hours. HbA1C:  Recent Labs  04/02/17 0814  HGBA1C 5.7*   CBG: No results for input(s): GLUCAP in the last 168 hours. Lipid Profile:  Recent Labs  04/02/17 0814  CHOL 211*  HDL 69  LDLCALC  130*  TRIG 59  CHOLHDL 3.1   Thyroid Function Tests: No results for input(s): TSH, T4TOTAL, FREET4, T3FREE, THYROIDAB in the last 72 hours. Anemia Panel: No results for input(s): VITAMINB12, FOLATE, FERRITIN, TIBC, IRON, RETICCTPCT in the last 72 hours. Sepsis Labs: No results for input(s): PROCALCITON, LATICACIDVEN in the last 168 hours.  Recent Results (from the past 240 hour(s))  Urine culture     Status: Abnormal   Collection Time: 04/01/17 12:16 PM  Result Value Ref Range Status   Specimen Description URINE, CATHETERIZED  Final   Special Requests Normal  Final   Culture MULTIPLE SPECIES PRESENT, SUGGEST RECOLLECTION (A)  Final   Report Status 04/02/2017 FINAL  Final         Radiology Studies: No results found.      Scheduled Meds: . iopamidol      . aspirin  325 mg Oral Daily  . atorvastatin  80 mg Oral q1800  . clopidogrel  75 mg Oral Daily  . enoxaparin (LOVENOX) injection  40 mg Subcutaneous Q24H  . sodium chloride flush  3 mL Intravenous Q12H   Continuous Infusions: . sodium chloride    . sodium chloride 10 mL/hr at 04/01/17 1435     LOS: 2 days    Time spent: 35 minutes.     Hosie Poisson, MD [facility] Pager [phone]  If 7PM-7AM, please contact night-coverage [email or web address] Password [email or web address] 04/04/2017, 11:25 AM

## 2017-04-05 DIAGNOSIS — I639 Cerebral infarction, unspecified: Secondary | ICD-10-CM

## 2017-04-05 NOTE — Progress Notes (Signed)
PROGRESS NOTE    [name] [name]  CZY:[id number] DOB: [birth year] DOA: 04/01/2017 PCP: Patient, No Pcp Per    Brief Narrative:[name] [name] is a 71 y.o. female who presents with R arm weakness concerning for stroke. Neuro consulted and recommendations given. She was found to have patchyacute infarcts in the left greater than right cerebral hemispheres suggesting watershed ischemia. Carotid duples showing mod left ICA Stenosis, it was followed up with a CTA of the head and neck showing Critical proximal left ICA stenosis due to atheromatous plaque. Vascular consulted for recommendations.    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  Stroke team recommended TEE, loop recorder placement. But patient refused.  PT eval recommended CIR.  LDL is 130. hgba1c is 5.7.  Currently on aspirin, plavix and lipitor. Recommend 3 months of DAPT , followed by plavix.  Carotid duplex showing stenosis on the LEFT ICA. Vascular consulted, and underwent CTA of the head and neck , and  For possible surgery next week.    Hypertensive urgency:  Permissive hypertension.upto 72 hours.  Start her low dose amlodipine.    Hypokalemia: repleted.    DVT prophylaxis: (Lovenox/ Code Status: (Full) Family Communication: none at bedside.  Disposition Plan: CIR when stable.    Consultants:   Neurology / cardiology.   Vascular consult   Procedures: echocardiogram.   Venous duplex of the lower extremities negative for DVT  Carotid duplex showed left ICA stenosis upto 90%  CTA pending. . .    Antimicrobials: none.    Subjective: Pleasant, no  complaints. No chest pain or sob.   Objective: Vitals:   04/04/17 2042 04/05/17 0012 04/05/17 0519 04/05/17 0929  BP: (!) 148/48 (!) 174/67 (!) 170/73 (!) 168/66  Pulse: 73 62 74 63  Resp: 16 16 16 16   Temp: 98.8 F (37.1 C) 97.7 F (36.5 C) 97.6 F (36.4 C) 97.6 F (36.4 C)  TempSrc: Oral Oral Oral Oral  SpO2: 98% 99% 99% 100%  Weight:      Height:        Intake/Output Summary (Last 24 hours) at 04/05/17 1020 Last data filed at 04/04/17 1100  Gross per 24 hour  Intake              120 ml  Output                0 ml  Net              120 ml   Filed Weights   04/01/17 0910  Weight: 63.5 kg (140 lb)    Examination:  General exam: Appears calm and comfortable  Respiratory system: Clear to auscultation. Respiratory effort normal. Cardiovascular system: S1 & S2 heard, RRR. No JVD, murmurs, rubs, gallops or clicks. No pedal edema. Gastrointestinal system: Abdomen is nondistended, soft and nontender. No organomegaly or masses felt. Normal bowel sounds heard. Central nervous system: Alert and   confused, right arm strength 2/5.  Extremities: Symmetric 5 x 5 power. Skin: No rashes, lesions or ulcers     Data Reviewed: I have personally reviewed following labs and imaging studies  CBC:  Recent Labs Lab  04/01/17 0920 04/01/17 0932 04/02/17 0814  WBC 5.6  --  3.6*  NEUTROABS 4.4  --   --   HGB 13.5 13.9 14.8  HCT 42.2 41.0 45.9  MCV 80.7  --  81.2  PLT 161  --  654   Basic Metabolic Panel:  Recent Labs Lab 04/01/17 0920 04/01/17 0932 04/02/17 0814  NA 140 141 140  K 3.2* 3.2* 3.9  CL 106 104 107  CO2 24  --  26  GLUCOSE 119* 117* 93  BUN 9 9 9   CREATININE 0.81 0.80 0.83  CALCIUM 9.1  --  9.0   GFR: Estimated Creatinine Clearance: 65 mL/min (by C-G formula based on SCr of 0.83 mg/dL). Liver Function Tests:  Recent Labs Lab 04/01/17 0920  AST 17  ALT 12*  ALKPHOS 68  BILITOT 0.9  PROT 6.7  ALBUMIN 3.7   No results for input(s): LIPASE,  AMYLASE in the last 168 hours. No results for input(s): AMMONIA in the last 168 hours. Coagulation Profile:  Recent Labs Lab 04/01/17 0920  INR 1.08   Cardiac Enzymes:  Recent Labs Lab 04/01/17 1926  TROPONINI <0.03   BNP (last 3 results) No results for input(s): PROBNP in the last 8760 hours. HbA1C: No results for input(s): HGBA1C in the last 72 hours. CBG: No results for input(s): GLUCAP in the last 168 hours. Lipid Profile: No results for input(s): CHOL, HDL, LDLCALC, TRIG, CHOLHDL, LDLDIRECT in the last 72 hours. Thyroid Function Tests: No results for input(s): TSH, T4TOTAL, FREET4, T3FREE, THYROIDAB in the last 72 hours. Anemia Panel: No results for input(s): VITAMINB12, FOLATE, FERRITIN, TIBC, IRON, RETICCTPCT in the last 72 hours. Sepsis Labs: No results for input(s): PROCALCITON, LATICACIDVEN in the last 168 hours.  Recent Results (from the past 240 hour(s))  Urine culture     Status: Abnormal   Collection Time: 04/01/17 12:16 PM  Result Value Ref Range Status   Specimen Description URINE, CATHETERIZED  Final   Special Requests Normal  Final   Culture MULTIPLE SPECIES PRESENT, SUGGEST RECOLLECTION (A)  Final   Report Status 04/02/2017 FINAL  Final         Radiology Studies: Ct Angio Head W Or Wo Contrast  Result Date: 04/04/2017 CLINICAL DATA:  Stroke. EXAM: CT ANGIOGRAPHY HEAD AND NECK TECHNIQUE: Multidetector CT imaging of the head and neck was performed using the standard protocol during bolus administration of intravenous contrast. Multiplanar CT image reconstructions and MIPs were obtained to evaluate the vascular anatomy. Carotid stenosis measurements (when applicable) are obtained utilizing NASCET criteria, using the distal internal carotid diameter as the denominator. CONTRAST:  50 cc Isovue 370 intravenous COMPARISON:  Brain MRI from 3 days prior FINDINGS: CT HEAD FINDINGS Brain: Dark watershed infarcts along the cerebral convexities that are  underestimated relative to previous MRI. No detected progression or hemorrhagic conversion. Remote lacunar infarct in the genu of the right internal capsule. Extensive, confluent chronic [place] vessel ischemic gliosis in the cerebral white matter. Cerebellar volume loss. Vascular: See below Skull: No acute or aggressive finding Sinuses: Negative Orbits: Negative Review of the MIP images confirms the above findings CTA NECK FINDINGS Aortic arch: 2 vessel branching pattern. Diffuse atheromatous wall thickening. Negative for visualized aneurysm. Right carotid system: Shelf-like filling defect in the posterior wall at the distal common carotid without flow limiting stenosis. Left carotid system: Predominant noncalcified plaque at the common carotid bifurcation and ICA bulb with severe proximal left ICA stenosis (lumen measuring ~1 mm at its narrowest). There  is also advanced narrowing at the ECA origin Vertebral arteries: No proximal subclavian stenosis. No brachiocephalic narrowing. Atheromatous plaque in the bilateral V1 and V2 segments. Mild to moderate narrowing at the right V1 and V2 segment. No branch occlusion or dissection noted. Skeleton: No acute or aggressive finding. Other neck: No incidental mass or adenopathy. Left submandibular glands resection or atrophy. Upper chest: No acute finding Review of the MIP images confirms the above findings CTA HEAD FINDINGS Anterior circulation: Atherosclerotic plaque in the bilateral carotid siphons with severe right cavernous stenosis at the midportion and separately at the anterior genu. Mild right supraclinoid ICA narrowing. Moderate to advanced right M1 segment stenosis. Moderate left ICA stenosis at the anterior genu cavernous carotid. Mild proximal left M1 segment stenosis. The right A1 segment is hypoplastic. No branch occlusion noted. Posterior circulation: Two advanced stenoses in the right V4 segment, just beyond the dura in just before the vertebrobasilar  junction. Severe proximal left V4 segment stenosis at an irregular mixed density plaque. Advanced right P2 segment stenosis and PCA branch atheromatous irregularity bilaterally Venous sinuses: Patent Anatomic variants: None significant Delayed phase: No abnormal intracranial enhancement. Review of the MIP images confirms the above findings IMPRESSION: CTA neck: 1. Critical proximal left ICA stenosis due to atheromatous plaque. 2. Web or shelf-like plaque in the right distal common carotid artery without stenosis. 3. Mild to moderate right V1 and V2 segment stenoses. CTA head: 1. Advanced atherosclerosis. 2. Tandem advanced right cavernous ICA stenoses. Moderate left ICA stenosis. 3. Tandem right and single left V4 advanced stenoses. 4. Diffuse atherosclerotic irregularity of medium sized ACA, MCA, and PCA branches. Notable high-grade right M1 and right P2 segment stenoses. 5. 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 6. Extensive chronic [place] vessel ischemia in the cerebral white matter. Electronically Signed   By: Monte Fantasia M.D.   On: 04/04/2017 13:19   Ct Angio Neck W Or Wo Contrast  Result Date: 04/04/2017 CLINICAL DATA:  Stroke. EXAM: CT ANGIOGRAPHY HEAD AND NECK TECHNIQUE: Multidetector CT imaging of the head and neck was performed using the standard protocol during bolus administration of intravenous contrast. Multiplanar CT image reconstructions and MIPs were obtained to evaluate the vascular anatomy. Carotid stenosis measurements (when applicable) are obtained utilizing NASCET criteria, using the distal internal carotid diameter as the denominator. CONTRAST:  50 cc Isovue 370 intravenous COMPARISON:  Brain MRI from 3 days prior FINDINGS: CT HEAD FINDINGS Brain: [place] watershed infarcts along the cerebral convexities that are underestimated relative to previous MRI. No detected progression or hemorrhagic conversion. Remote lacunar infarct in the genu  of the right internal capsule. Extensive, confluent chronic [place] vessel ischemic gliosis in the cerebral white matter. Cerebellar volume loss. Vascular: See below Skull: No acute or aggressive finding Sinuses: Negative Orbits: Negative Review of the MIP images confirms the above findings CTA NECK FINDINGS Aortic arch: 2 vessel branching pattern. Diffuse atheromatous wall thickening. Negative for visualized aneurysm. Right carotid system: Shelf-like filling defect in the posterior wall at the distal common carotid without flow limiting stenosis. Left carotid system: Predominant noncalcified plaque at the common carotid bifurcation and ICA bulb with severe proximal left ICA stenosis (lumen measuring ~1 mm at its narrowest). There is also advanced narrowing at the ECA origin Vertebral arteries: No proximal subclavian stenosis. No brachiocephalic narrowing. Atheromatous plaque in the bilateral V1 and V2 segments. Mild to moderate narrowing at the right V1 and V2 segment. No branch occlusion or dissection noted. Skeleton:  No acute or aggressive finding. Other neck: No incidental mass or adenopathy. Left submandibular glands resection or atrophy. Upper chest: No acute finding Review of the MIP images confirms the above findings CTA HEAD FINDINGS Anterior circulation: Atherosclerotic plaque in the bilateral carotid siphons with severe right cavernous stenosis at the midportion and separately at the anterior genu. Mild right supraclinoid ICA narrowing. Moderate to advanced right M1 segment stenosis. Moderate left ICA stenosis at the anterior genu cavernous carotid. Mild proximal left M1 segment stenosis. The right A1 segment is hypoplastic. No branch occlusion noted. Posterior circulation: Two advanced stenoses in the right V4 segment, just beyond the dura in just before the vertebrobasilar junction. Severe proximal left V4 segment stenosis at an irregular mixed density plaque. Advanced right P2 segment stenosis and PCA  branch atheromatous irregularity bilaterally Venous sinuses: Patent Anatomic variants: None significant Delayed phase: No abnormal intracranial enhancement. Review of the MIP images confirms the above findings IMPRESSION: CTA neck: 1. Critical proximal left ICA stenosis due to atheromatous plaque. 2. Web or shelf-like plaque in the right distal common carotid artery without stenosis. 3. Mild to moderate right V1 and V2 segment stenoses. CTA head: 1. Advanced atherosclerosis. 2. Tandem advanced right cavernous ICA stenoses. Moderate left ICA stenosis. 3. Tandem right and single left V4 advanced stenoses. 4. Diffuse atherosclerotic irregularity of medium sized ACA, MCA, and PCA branches. Notable high-grade right M1 and right P2 segment stenoses. 5. 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 6. Extensive chronic [place] vessel ischemia in the cerebral white matter. Electronically Signed   By: Monte Fantasia M.D.   On: 04/04/2017 13:19        Scheduled Meds: . amLODipine  2.5 mg Oral Daily  . aspirin  325 mg Oral Daily  . atorvastatin  80 mg Oral q1800  . clopidogrel  75 mg Oral Daily  . enoxaparin (LOVENOX) injection  40 mg Subcutaneous Q24H  . sodium chloride flush  3 mL Intravenous Q12H   Continuous Infusions: . sodium chloride    . sodium chloride 10 mL/hr at 04/01/17 1435     LOS: 3 days    Time spent: 35 minutes.     Hosie Poisson, MD [facility] Pager [phone]  If 7PM-7AM, please contact night-coverage [email or web address] Password [email or web address] 04/05/2017, 10:20 AM

## 2017-04-06 NOTE — Progress Notes (Signed)
PROGRESS NOTE    [name] [name]  HLK:[id number] DOB: 12-30-1945 DOA: 04/01/2017 PCP: Patient, No Pcp Per    Brief Narrative:[name] [name] is a 71 y.o. female who presents with R arm weakness concerning for stroke. Neuro consulted and recommendations given. She was found to have patchyacute infarcts in the left greater than right cerebral hemispheres suggesting watershed ischemia. Carotid duples showing mod left ICA Stenosis, it was followed up with a CTA of the head and neck showing Critical proximal left ICA stenosis due to atheromatous plaque. Vascular consulted for recommendations.    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  Stroke team recommended TEE, loop recorder placement. But patient refused.  PT eval recommended CIR.  LDL is 130. hgba1c is 5.7.  Currently on aspirin, plavix and lipitor. Recommend 3 months of DAPT , followed by plavix.  Carotid duplex showing stenosis on the LEFT ICA. Vascular consulted, and underwent CTA of the head and neck , and  For possible surgery next week.  No new complaints.    Hypertensive urgency:  Permissive hypertension.upto 72 hours.  Started her low dose amlodipine.  bp better controlled today.    Hypokalemia: repleted.    DVT prophylaxis: (Lovenox/ Code Status: (Full) Family Communication: none at bedside.  Disposition Plan: CIR when stable.    Consultants:   Neurology / cardiology.   Vascular consult   Procedures: echocardiogram.   Venous duplex of the lower extremities negative for DVT  Carotid duplex showed left ICA stenosis upto 90%  CTA pending. . .     Antimicrobials: none.    Subjective: Pleasant, no complaints. No chest pain or sob.  No family at bedside.   Objective: Vitals:   04/05/17 2100 04/06/17 0142 04/06/17 0441 04/06/17 0954  BP: (!) 161/62 136/72 (!) 175/74 (!) 153/56  Pulse: 70 70 70 72  Resp: 18 20 18 16   Temp: 98.3 F (36.8 C) 97.5 F (36.4 C) 97.9 F (36.6 C)   TempSrc: Oral Oral Oral   SpO2: 100% 99% 97%   Weight:      Height:       No intake or output data in the 24 hours ending 04/06/17 1122 Filed Weights   04/01/17 0910  Weight: 63.5 kg (140 lb)    Examination:  General exam: Appears calm and comfortable  Respiratory system: Clear to auscultation. Respiratory effort normal. Cardiovascular system: S1 & S2 heard, RRR. No JVD, murmurs, rubs, gallops or clicks. No pedal edema. Gastrointestinal system: Abdomen is nondistended, soft and nontender. No organomegaly or masses felt. Normal bowel sounds heard. Central nervous system: Alert and   confused, right arm strength 2/5. No new deficits.  Extremities: Symmetric 5 x 5 power. Skin: No rashes, lesions or ulcers     Data Reviewed: I have personally reviewed following labs and imaging studies  CBC:  Recent Labs Lab 04/01/17 0920 04/01/17 0932 04/02/17 0814  WBC 5.6  --  3.6*  NEUTROABS 4.4  --   --   HGB 13.5 13.9 14.8  HCT 42.2 41.0 45.9  MCV 80.7  --  81.2  PLT 161  --  734   Basic Metabolic  Panel:  Recent Labs Lab 04/01/17 0920 04/01/17 0932 04/02/17 0814  NA 140 141 140  K 3.2* 3.2* 3.9  CL 106 104 107  CO2 24  --  26  GLUCOSE 119* 117* 93  BUN 9 9 9   CREATININE 0.81 0.80 0.83  CALCIUM 9.1  --  9.0   GFR: Estimated Creatinine Clearance: 65 mL/min (by C-G formula based on SCr of 0.83 mg/dL). Liver Function Tests:  Recent Labs Lab 04/01/17 0920  AST 17  ALT 12*  ALKPHOS 68  BILITOT 0.9  PROT 6.7  ALBUMIN 3.7   No results for input(s): LIPASE, AMYLASE in the last 168 hours. No results for input(s): AMMONIA in  the last 168 hours. Coagulation Profile:  Recent Labs Lab 04/01/17 0920  INR 1.08   Cardiac Enzymes:  Recent Labs Lab 04/01/17 1926  TROPONINI <0.03   BNP (last 3 results) No results for input(s): PROBNP in the last 8760 hours. HbA1C: No results for input(s): HGBA1C in the last 72 hours. CBG: No results for input(s): GLUCAP in the last 168 hours. Lipid Profile: No results for input(s): CHOL, HDL, LDLCALC, TRIG, CHOLHDL, LDLDIRECT in the last 72 hours. Thyroid Function Tests: No results for input(s): TSH, T4TOTAL, FREET4, T3FREE, THYROIDAB in the last 72 hours. Anemia Panel: No results for input(s): VITAMINB12, FOLATE, FERRITIN, TIBC, IRON, RETICCTPCT in the last 72 hours. Sepsis Labs: No results for input(s): PROCALCITON, LATICACIDVEN in the last 168 hours.  Recent Results (from the past 240 hour(s))  Urine culture     Status: Abnormal   Collection Time: 04/01/17 12:16 PM  Result Value Ref Range Status   Specimen Description URINE, CATHETERIZED  Final   Special Requests Normal  Final   Culture MULTIPLE SPECIES PRESENT, SUGGEST RECOLLECTION (A)  Final   Report Status 04/02/2017 FINAL  Final         Radiology Studies: Ct Angio Head W Or Wo Contrast  Result Date: 04/04/2017 CLINICAL DATA:  Stroke. EXAM: CT ANGIOGRAPHY HEAD AND NECK TECHNIQUE: Multidetector CT imaging of the head and neck was performed using the standard protocol during bolus administration of intravenous contrast. Multiplanar CT image reconstructions and MIPs were obtained to evaluate the vascular anatomy. Carotid stenosis measurements (when applicable) are obtained utilizing NASCET criteria, using the distal internal carotid diameter as the denominator. CONTRAST:  50 cc Isovue 370 intravenous COMPARISON:  Brain MRI from 3 days prior FINDINGS: CT HEAD FINDINGS Brain: [place] watershed infarcts along the cerebral convexities that are underestimated relative to previous MRI. No detected progression or  hemorrhagic conversion. Remote lacunar infarct in the genu of the right internal capsule. Extensive, confluent chronic [place] vessel ischemic gliosis in the cerebral white matter. Cerebellar volume loss. Vascular: See below Skull: No acute or aggressive finding Sinuses: Negative Orbits: Negative Review of the MIP images confirms the above findings CTA NECK FINDINGS Aortic arch: 2 vessel branching pattern. Diffuse atheromatous wall thickening. Negative for visualized aneurysm. Right carotid system: Shelf-like filling defect in the posterior wall at the distal common carotid without flow limiting stenosis. Left carotid system: Predominant noncalcified plaque at the common carotid bifurcation and ICA bulb with severe proximal left ICA stenosis (lumen measuring ~1 mm at its narrowest). There is also advanced narrowing at the ECA origin Vertebral arteries: No proximal subclavian stenosis. No brachiocephalic narrowing. Atheromatous plaque in the bilateral V1 and V2 segments. Mild to moderate narrowing at the right V1 and V2 segment. No branch occlusion or dissection noted. Skeleton: No acute or aggressive finding. Other  neck: No incidental mass or adenopathy. Left submandibular glands resection or atrophy. Upper chest: No acute finding Review of the MIP images confirms the above findings CTA HEAD FINDINGS Anterior circulation: Atherosclerotic plaque in the bilateral carotid siphons with severe right cavernous stenosis at the midportion and separately at the anterior genu. Mild right supraclinoid ICA narrowing. Moderate to advanced right M1 segment stenosis. Moderate left ICA stenosis at the anterior genu cavernous carotid. Mild proximal left M1 segment stenosis. The right A1 segment is hypoplastic. No branch occlusion noted. Posterior circulation: Two advanced stenoses in the right V4 segment, just beyond the dura in just before the vertebrobasilar junction. Severe proximal left V4 segment stenosis at an irregular mixed  density plaque. Advanced right P2 segment stenosis and PCA branch atheromatous irregularity bilaterally Venous sinuses: Patent Anatomic variants: None significant Delayed phase: No abnormal intracranial enhancement. Review of the MIP images confirms the above findings IMPRESSION: CTA neck: 1. Critical proximal left ICA stenosis due to atheromatous plaque. 2. Web or shelf-like plaque in the right distal common carotid artery without stenosis. 3. Mild to moderate right V1 and V2 segment stenoses. CTA head: 1. Advanced atherosclerosis. 2. Tandem advanced right cavernous ICA stenoses. Moderate left ICA stenosis. 3. Tandem right and single left V4 advanced stenoses. 4. Diffuse atherosclerotic irregularity of medium sized ACA, MCA, and PCA branches. Notable high-grade right M1 and right P2 segment stenoses. 5. 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 6. Extensive chronic [place] vessel ischemia in the cerebral white matter. Electronically Signed   By: Monte Fantasia M.D.   On: 04/04/2017 13:19   Ct Angio Neck W Or Wo Contrast  Result Date: 04/04/2017 CLINICAL DATA:  Stroke. EXAM: CT ANGIOGRAPHY HEAD AND NECK TECHNIQUE: Multidetector CT imaging of the head and neck was performed using the standard protocol during bolus administration of intravenous contrast. Multiplanar CT image reconstructions and MIPs were obtained to evaluate the vascular anatomy. Carotid stenosis measurements (when applicable) are obtained utilizing NASCET criteria, using the distal internal carotid diameter as the denominator. CONTRAST:  50 cc Isovue 370 intravenous COMPARISON:  Brain MRI from 3 days prior FINDINGS: CT HEAD FINDINGS Brain: [place] watershed infarcts along the cerebral convexities that are underestimated relative to previous MRI. No detected progression or hemorrhagic conversion. Remote lacunar infarct in the genu of the right internal capsule. Extensive, confluent chronic [place] vessel  ischemic gliosis in the cerebral white matter. Cerebellar volume loss. Vascular: See below Skull: No acute or aggressive finding Sinuses: Negative Orbits: Negative Review of the MIP images confirms the above findings CTA NECK FINDINGS Aortic arch: 2 vessel branching pattern. Diffuse atheromatous wall thickening. Negative for visualized aneurysm. Right carotid system: Shelf-like filling defect in the posterior wall at the distal common carotid without flow limiting stenosis. Left carotid system: Predominant noncalcified plaque at the common carotid bifurcation and ICA bulb with severe proximal left ICA stenosis (lumen measuring ~1 mm at its narrowest). There is also advanced narrowing at the ECA origin Vertebral arteries: No proximal subclavian stenosis. No brachiocephalic narrowing. Atheromatous plaque in the bilateral V1 and V2 segments. Mild to moderate narrowing at the right V1 and V2 segment. No branch occlusion or dissection noted. Skeleton: No acute or aggressive finding. Other neck: No incidental mass or adenopathy. Left submandibular glands resection or atrophy. Upper chest: No acute finding Review of the MIP images confirms the above findings CTA HEAD FINDINGS Anterior circulation: Atherosclerotic plaque in the bilateral carotid siphons with severe right cavernous stenosis at  the midportion and separately at the anterior genu. Mild right supraclinoid ICA narrowing. Moderate to advanced right M1 segment stenosis. Moderate left ICA stenosis at the anterior genu cavernous carotid. Mild proximal left M1 segment stenosis. The right A1 segment is hypoplastic. No branch occlusion noted. Posterior circulation: Two advanced stenoses in the right V4 segment, just beyond the dura in just before the vertebrobasilar junction. Severe proximal left V4 segment stenosis at an irregular mixed density plaque. Advanced right P2 segment stenosis and PCA branch atheromatous irregularity bilaterally Venous sinuses: Patent  Anatomic variants: None significant Delayed phase: No abnormal intracranial enhancement. Review of the MIP images confirms the above findings IMPRESSION: CTA neck: 1. Critical proximal left ICA stenosis due to atheromatous plaque. 2. Web or shelf-like plaque in the right distal common carotid artery without stenosis. 3. Mild to moderate right V1 and V2 segment stenoses. CTA head: 1. Advanced atherosclerosis. 2. Tandem advanced right cavernous ICA stenoses. Moderate left ICA stenosis. 3. Tandem right and single left V4 advanced stenoses. 4. Diffuse atherosclerotic irregularity of medium sized ACA, MCA, and PCA branches. Notable high-grade right M1 and right P2 segment stenoses. 5. 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 6. Extensive chronic [place] vessel ischemia in the cerebral white matter. Electronically Signed   By: Monte Fantasia M.D.   On: 04/04/2017 13:19        Scheduled Meds: . amLODipine  2.5 mg Oral Daily  . aspirin  325 mg Oral Daily  . atorvastatin  80 mg Oral q1800  . clopidogrel  75 mg Oral Daily  . enoxaparin (LOVENOX) injection  40 mg Subcutaneous Q24H  . sodium chloride flush  3 mL Intravenous Q12H   Continuous Infusions: . sodium chloride       LOS: 4 days    Time spent: 35 minutes.     Hosie Poisson, MD [facility] Pager [phone]  If 7PM-7AM, please contact night-coverage [email or web address] Password [email or web address] 04/06/2017, 11:22 AM

## 2017-04-07 DIAGNOSIS — R464 Slowness and poor responsiveness: Secondary | ICD-10-CM

## 2017-04-07 DIAGNOSIS — I63232 Cerebral infarction due to unspecified occlusion or stenosis of left carotid arteries: Principal | ICD-10-CM

## 2017-04-07 DIAGNOSIS — I639 Cerebral infarction, unspecified: Secondary | ICD-10-CM

## 2017-04-07 MED ORDER — CEFAZOLIN SODIUM-DEXTROSE 1-4 GM/50ML-% IV SOLN
1.0000 g | INTRAVENOUS | Status: AC
  Administered 2017-04-07: 1 g via INTRAVENOUS
  Filled 2017-04-07: qty 50

## 2017-04-07 NOTE — Progress Notes (Signed)
Subjective: Interval History: none.. Sitting up and eating breakfast.  Objective: Vital signs in last 24 hours: Temp:  [97.5 F (36.4 C)-98.9 F (37.2 C)] 97.7 F (36.5 C) (05/28 0703) Pulse Rate:  [72-75] 74 (05/28 0703) Resp:  [16-20] 18 (05/28 0703) BP: (150-167)/(52-74) 162/74 (05/28 0703) SpO2:  [98 %-100 %] 100 % (05/28 0703)  Intake/Output from previous day: No intake/output data recorded. Intake/Output this shift: No intake/output data recorded.  No change in right-sided weakness  Lab Results: No results for input(s): WBC, HGB, HCT, PLT in the last 72 hours. BMET No results for input(s): NA, K, CL, CO2, GLUCOSE, BUN, CREATININE, CALCIUM in the last 72 hours.  Studies/Results: Ct Angio Head W Or Wo Contrast  Result Date: 04/04/2017 CLINICAL DATA:  Stroke. EXAM: CT ANGIOGRAPHY HEAD AND NECK TECHNIQUE: Multidetector CT imaging of the head and neck was performed using the standard protocol during bolus administration of intravenous contrast. Multiplanar CT image reconstructions and MIPs were obtained to evaluate the vascular anatomy. Carotid stenosis measurements (when applicable) are obtained utilizing NASCET criteria, using the distal internal carotid diameter as the denominator. CONTRAST:  50 cc Isovue 370 intravenous COMPARISON:  Brain MRI from 3 days prior FINDINGS: CT HEAD FINDINGS Brain: Garraway watershed infarcts along the cerebral convexities that are underestimated relative to previous MRI. No detected progression or hemorrhagic conversion. Remote lacunar infarct in the genu of the right internal capsule. Extensive, confluent chronic Vogelgesang vessel ischemic gliosis in the cerebral white matter. Cerebellar volume loss. Vascular: See below Skull: No acute or aggressive finding Sinuses: Negative Orbits: Negative Review of the MIP images confirms the above findings CTA NECK FINDINGS Aortic arch: 2 vessel branching pattern. Diffuse atheromatous wall thickening. Negative for  visualized aneurysm. Right carotid system: Shelf-like filling defect in the posterior wall at the distal common carotid without flow limiting stenosis. Left carotid system: Predominant noncalcified plaque at the common carotid bifurcation and ICA bulb with severe proximal left ICA stenosis (lumen measuring ~1 mm at its narrowest). There is also advanced narrowing at the ECA origin Vertebral arteries: No proximal subclavian stenosis. No brachiocephalic narrowing. Atheromatous plaque in the bilateral V1 and V2 segments. Mild to moderate narrowing at the right V1 and V2 segment. No branch occlusion or dissection noted. Skeleton: No acute or aggressive finding. Other neck: No incidental mass or adenopathy. Left submandibular glands resection or atrophy. Upper chest: No acute finding Review of the MIP images confirms the above findings CTA HEAD FINDINGS Anterior circulation: Atherosclerotic plaque in the bilateral carotid siphons with severe right cavernous stenosis at the midportion and separately at the anterior genu. Mild right supraclinoid ICA narrowing. Moderate to advanced right M1 segment stenosis. Moderate left ICA stenosis at the anterior genu cavernous carotid. Mild proximal left M1 segment stenosis. The right A1 segment is hypoplastic. No branch occlusion noted. Posterior circulation: Two advanced stenoses in the right V4 segment, just beyond the dura in just before the vertebrobasilar junction. Severe proximal left V4 segment stenosis at an irregular mixed density plaque. Advanced right P2 segment stenosis and PCA branch atheromatous irregularity bilaterally Venous sinuses: Patent Anatomic variants: None significant Delayed phase: No abnormal intracranial enhancement. Review of the MIP images confirms the above findings IMPRESSION: CTA neck: 1. Critical proximal left ICA stenosis due to atheromatous plaque. 2. Web or shelf-like plaque in the right distal common carotid artery without stenosis. 3. Mild to  moderate right V1 and V2 segment stenoses. CTA head: 1. Advanced atherosclerosis. 2. Tandem advanced right cavernous ICA stenoses. Moderate  left ICA stenosis. 3. Tandem right and single left V4 advanced stenoses. 4. Diffuse atherosclerotic irregularity of medium sized ACA, MCA, and PCA branches. Notable high-grade right M1 and right P2 segment stenoses. 5. 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 6. Extensive chronic Timmers vessel ischemia in the cerebral white matter. Electronically Signed   By: Monte Fantasia M.D.   On: 04/04/2017 13:19   Ct Head Wo Contrast  Result Date: 04/01/2017 CLINICAL DATA:  Right arm paralysis for several days, initial encounter EXAM: CT HEAD WITHOUT CONTRAST TECHNIQUE: Contiguous axial images were obtained from the base of the skull through the vertex without intravenous contrast. COMPARISON:  None. FINDINGS: Brain: Chronic white matter ischemic change is seen. No findings to suggest acute hemorrhage, acute infarction or space-occupying mass lesion are noted. Prior lacunar infarcts are noted within the internal capsule on the right in the basal ganglia on the left. Vascular: No hyperdense vessel or unexpected calcification. Skull: Normal. Negative for fracture or focal lesion. Sinuses/Orbits: No acute finding. Other: None. IMPRESSION: Chronic white matter ischemic change. Lacunar infarcts are noted. No acute abnormality is seen. Electronically Signed   By: Inez Catalina M.D.   On: 04/01/2017 10:53   Ct Angio Neck W Or Wo Contrast  Result Date: 04/04/2017 CLINICAL DATA:  Stroke. EXAM: CT ANGIOGRAPHY HEAD AND NECK TECHNIQUE: Multidetector CT imaging of the head and neck was performed using the standard protocol during bolus administration of intravenous contrast. Multiplanar CT image reconstructions and MIPs were obtained to evaluate the vascular anatomy. Carotid stenosis measurements (when applicable) are obtained utilizing NASCET  criteria, using the distal internal carotid diameter as the denominator. CONTRAST:  50 cc Isovue 370 intravenous COMPARISON:  Brain MRI from 3 days prior FINDINGS: CT HEAD FINDINGS Brain: Colligan watershed infarcts along the cerebral convexities that are underestimated relative to previous MRI. No detected progression or hemorrhagic conversion. Remote lacunar infarct in the genu of the right internal capsule. Extensive, confluent chronic Nanez vessel ischemic gliosis in the cerebral white matter. Cerebellar volume loss. Vascular: See below Skull: No acute or aggressive finding Sinuses: Negative Orbits: Negative Review of the MIP images confirms the above findings CTA NECK FINDINGS Aortic arch: 2 vessel branching pattern. Diffuse atheromatous wall thickening. Negative for visualized aneurysm. Right carotid system: Shelf-like filling defect in the posterior wall at the distal common carotid without flow limiting stenosis. Left carotid system: Predominant noncalcified plaque at the common carotid bifurcation and ICA bulb with severe proximal left ICA stenosis (lumen measuring ~1 mm at its narrowest). There is also advanced narrowing at the ECA origin Vertebral arteries: No proximal subclavian stenosis. No brachiocephalic narrowing. Atheromatous plaque in the bilateral V1 and V2 segments. Mild to moderate narrowing at the right V1 and V2 segment. No branch occlusion or dissection noted. Skeleton: No acute or aggressive finding. Other neck: No incidental mass or adenopathy. Left submandibular glands resection or atrophy. Upper chest: No acute finding Review of the MIP images confirms the above findings CTA HEAD FINDINGS Anterior circulation: Atherosclerotic plaque in the bilateral carotid siphons with severe right cavernous stenosis at the midportion and separately at the anterior genu. Mild right supraclinoid ICA narrowing. Moderate to advanced right M1 segment stenosis. Moderate left ICA stenosis at the anterior genu  cavernous carotid. Mild proximal left M1 segment stenosis. The right A1 segment is hypoplastic. No branch occlusion noted. Posterior circulation: Two advanced stenoses in the right V4 segment, just beyond the dura in just before the vertebrobasilar junction.  Severe proximal left V4 segment stenosis at an irregular mixed density plaque. Advanced right P2 segment stenosis and PCA branch atheromatous irregularity bilaterally Venous sinuses: Patent Anatomic variants: None significant Delayed phase: No abnormal intracranial enhancement. Review of the MIP images confirms the above findings IMPRESSION: CTA neck: 1. Critical proximal left ICA stenosis due to atheromatous plaque. 2. Web or shelf-like plaque in the right distal common carotid artery without stenosis. 3. Mild to moderate right V1 and V2 segment stenoses. CTA head: 1. Advanced atherosclerosis. 2. Tandem advanced right cavernous ICA stenoses. Moderate left ICA stenosis. 3. Tandem right and single left V4 advanced stenoses. 4. Diffuse atherosclerotic irregularity of medium sized ACA, MCA, and PCA branches. Notable high-grade right M1 and right P2 segment stenoses. 5. 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 6. Extensive chronic Doscher vessel ischemia in the cerebral white matter. Electronically Signed   By: Monte Fantasia M.D.   On: 04/04/2017 13:19   Mr Brain Wo Contrast  Result Date: 04/01/2017 CLINICAL DATA:  Right arm weakness and unsteady gait for 2 days. EXAM: MRI HEAD WITHOUT CONTRAST MRA HEAD WITHOUT CONTRAST MRI CERVICAL SPINE WITHOUT CONTRAST TECHNIQUE: Multiplanar, multiecho pulse sequences of the brain and surrounding structures were obtained without intravenous contrast. Angiographic images of the head were obtained using MRA technique without contrast. Multiplanar, multiecho pulse sequences of the cervical spine were obtained without intravenous contrast. COMPARISON:  Head CT 04/01/2017 FINDINGS:  MRI HEAD FINDINGS Brain: There are numerous Zia patchy acute infarcts predominantly sagittally oriented in the left cerebral hemisphere involving cortex and white matter with greatest involvement of the centrum semiovale suggestive of watershed ischemia. There are a few tiny acute infarcts in a similar distribution in the right cerebral hemisphere predominantly in the frontal and parietal lobes near the vertex. A 6 mm acute infarct is present in the genu of the corpus callosum. A chronic microhemorrhage is noted in the right thalamus. Patchy to confluent T2 hyperintensities in the cerebral white matter bilaterally are nonspecific but compatible with moderate chronic Lofquist vessel ischemia, advanced for age. Chronic lacunar infarcts are present in the cerebellum, bilateral basal ganglia, right internal capsule, and corpus callosum and deep cerebral white matter bilaterally. There is mild cerebral and moderate cerebellar atrophy. No mass, midline shift, or extra-axial fluid collection is seen. Mild to moderate chronic Borba vessel ischemic changes are present in the pons with a tiny chronic infarct noted. The pituitary gland is mildly prominent in size for age, measuring 8 mm in height. Vascular: Major intracranial vascular flow voids are preserved. Skull and upper cervical spine: Unremarkable bone marrow signal. Sinuses/Orbits: Unremarkable orbits. Paranasal sinuses and mastoid air cells are clear. Other: None. MRA HEAD FINDINGS The visualized distal vertebral arteries are patent to the basilar with the left being slightly larger than the right. There is diffuse V4 segment atherosclerotic irregularity with severe stenoses bilaterally, greater on the left. AICAs appear patent proximally with a severe proximal stenosis on the right. SCA origins are patent bilaterally. The basilar artery is patent with mild irregularity but no significant stenosis. Posterior communicating arteries are not identified. PCAs are patent  with a severe proximal P2 stenosis on the right and moderate to prominent branch vessel irregularity and narrowing bilaterally. The internal carotid arteries are patent from skullbase to carotid termini with advanced siphon atherosclerosis resulting in severe tandem cavernous stenoses on the right and moderate stenosis on the left. 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 The left  A1 segment is patent with mild irregularity proximally but no significant stenosis and supplies the right ACA via the anterior communicating artery. There are severe A2 stenoses bilaterally. The MCAs are patent without evidence of significant M1 stenosis or proximal branch occlusion. There is moderate MCA branch vessel atherosclerotic irregularity and narrowing bilaterally. No aneurysm is identified. MRI CERVICAL SPINE FINDINGS The study is mildly motion degraded. Alignment: Normal. Vertebrae: No evidence of fracture, osseous lesion, or significant marrow edema. Cord: Normal signal and morphology. Posterior Fossa, vertebral arteries, paraspinal tissues: Cerebral atrophy and infarcts as described above. Preserved vertebral artery flow voids in the neck. Disc levels: C2-3:  Negative. C3-4: Disc bulging and uncovertebral spurring result in mild right neural foraminal stenosis without spinal stenosis. C4-5: Minimal disc bulging and uncovertebral spurring without significant stenosis. C5-6:  Minimal disc bulging without stenosis. C6-7: Nardelli central disc extrusion with slight caudal migration without stenosis or significant spinal cord mass effect. C7-T1: Smitherman right paracentral disc protrusion without significant stenosis. IMPRESSION: 1. Patchy acute infarcts in the left greater than right cerebral hemispheres with distribution suggesting watershed ischemia. 2. 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 3. Mild prominence of the pituitary gland for age. Consider  laboratory correlation. 4. Advanced intracranial atherosclerosis as above including severe right and moderate left ICA stenoses, severe bilateral vertebral artery stenoses, and severe right P2 PCA stenosis. 5. Mild cervical disc degeneration without high-grade stenosis as above. Electronically Signed   By: Logan Bores M.D.   On: 04/01/2017 17:15   Mr Chest W Wo Contrast  Result Date: 04/02/2017 CLINICAL DATA:  Right arm weakness and unsteady gait for 2 days. EXAM: MRI BRACHIAL PLEXUS WITHOUT CONTRAST TECHNIQUE: Multiplanar, multiecho pulse sequences of the neck and surrounding structures were obtained without intravenous contrast. The field of view was focused on the right brachial plexus from the neural foramina to the axilla. CONTRAST:  33mL MULTIHANCE GADOBENATE DIMEGLUMINE 529 MG/ML IV SOLN COMPARISON:  None. FINDINGS: Despite efforts by the technologist and patient, motion artifact is present on today's exam and could not be eliminated. This reduces exam sensitivity and specificity. Spinal cord No specific abnormality were visualized; todays exam was not a dedicated study of the cervical or thoracic spine. Brachial plexus: No appreciable abnormal edema, mass effect, or other specific abnormality along the components of the right brachial plexus. 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 Other findings No supplemental non-categorized findings. IMPRESSION: 1. No significant abnormality of the brachial plexus is identified. 2. Cervical and thoracic spondylosis. 3. 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 Electronically Signed   By: Van Clines M.D.   On: 04/02/2017 07:10   Mr Cervical Spine Wo Contrast  Result Date: 04/01/2017 CLINICAL DATA:  Right arm weakness and unsteady gait for 2 days. EXAM: MRI HEAD WITHOUT CONTRAST MRA HEAD WITHOUT CONTRAST MRI CERVICAL SPINE WITHOUT  CONTRAST TECHNIQUE: Multiplanar, multiecho pulse sequences of the brain and surrounding structures were obtained without intravenous contrast. Angiographic images of the head were obtained using MRA technique without contrast. Multiplanar, multiecho pulse sequences of the cervical spine were obtained without intravenous contrast. COMPARISON:  Head CT 04/01/2017 FINDINGS: MRI HEAD FINDINGS Brain: There are numerous Gumina patchy acute infarcts predominantly sagittally oriented in the left cerebral hemisphere involving cortex and white matter with greatest involvement of the centrum semiovale suggestive of watershed ischemia. There are a few tiny acute infarcts in a  similar distribution in the right cerebral hemisphere predominantly in the frontal and parietal lobes near the vertex. A 6 mm acute infarct is present in the genu of the corpus callosum. A chronic microhemorrhage is noted in the right thalamus. Patchy to confluent T2 hyperintensities in the cerebral white matter bilaterally are nonspecific but compatible with moderate chronic Valenti vessel ischemia, advanced for age. Chronic lacunar infarcts are present in the cerebellum, bilateral basal ganglia, right internal capsule, and corpus callosum and deep cerebral white matter bilaterally. There is mild cerebral and moderate cerebellar atrophy. No mass, midline shift, or extra-axial fluid collection is seen. Mild to moderate chronic Sealey vessel ischemic changes are present in the pons with a tiny chronic infarct noted. The pituitary gland is mildly prominent in size for age, measuring 8 mm in height. Vascular: Major intracranial vascular flow voids are preserved. Skull and upper cervical spine: Unremarkable bone marrow signal. Sinuses/Orbits: Unremarkable orbits. Paranasal sinuses and mastoid air cells are clear. Other: None. MRA HEAD FINDINGS The visualized distal vertebral arteries are patent to the basilar with the left being slightly larger than the right.  There is diffuse V4 segment atherosclerotic irregularity with severe stenoses bilaterally, greater on the left. AICAs appear patent proximally with a severe proximal stenosis on the right. SCA origins are patent bilaterally. The basilar artery is patent with mild irregularity but no significant stenosis. Posterior communicating arteries are not identified. PCAs are patent with a severe proximal P2 stenosis on the right and moderate to prominent branch vessel irregularity and narrowing bilaterally. The internal carotid arteries are patent from skullbase to carotid termini with advanced siphon atherosclerosis resulting in severe tandem cavernous stenoses on the right and moderate stenosis on the left. 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 The left A1 segment is patent with mild irregularity proximally but no significant stenosis and supplies the right ACA via the anterior communicating artery. There are severe A2 stenoses bilaterally. The MCAs are patent without evidence of significant M1 stenosis or proximal branch occlusion. There is moderate MCA branch vessel atherosclerotic irregularity and narrowing bilaterally. No aneurysm is identified. MRI CERVICAL SPINE FINDINGS The study is mildly motion degraded. Alignment: Normal. Vertebrae: No evidence of fracture, osseous lesion, or significant marrow edema. Cord: Normal signal and morphology. Posterior Fossa, vertebral arteries, paraspinal tissues: Cerebral atrophy and infarcts as described above. Preserved vertebral artery flow voids in the neck. Disc levels: C2-3:  Negative. C3-4: Disc bulging and uncovertebral spurring result in mild right neural foraminal stenosis without spinal stenosis. C4-5: Minimal disc bulging and uncovertebral spurring without significant stenosis. C5-6:  Minimal disc bulging without stenosis. C6-7: Crouse central disc extrusion with slight caudal migration without stenosis or significant spinal cord mass effect.  C7-T1: Dugar right paracentral disc protrusion without significant stenosis. IMPRESSION: 1. Patchy acute infarcts in the left greater than right cerebral hemispheres with distribution suggesting watershed ischemia. 2. Age advanced chronic Blayney vessel ischemic disease with numerous chronic infarcts as above. 3. Mild prominence of the pituitary gland for age. Consider laboratory correlation. 4. Advanced intracranial atherosclerosis as above including severe right and moderate left ICA stenoses, severe bilateral vertebral artery stenoses, and severe right P2 PCA stenosis. 5. Mild cervical disc degeneration without high-grade stenosis as above. Electronically Signed   By: Logan Bores M.D.   On: 04/01/2017 17:15   Mr [name] Head/brain OY Cm  Result Date: 04/01/2017 CLINICAL DATA:  Right arm weakness and unsteady gait for 2 days. EXAM: MRI HEAD WITHOUT CONTRAST MRA HEAD  WITHOUT CONTRAST MRI CERVICAL SPINE WITHOUT CONTRAST TECHNIQUE: Multiplanar, multiecho pulse sequences of the brain and surrounding structures were obtained without intravenous contrast. Angiographic images of the head were obtained using MRA technique without contrast. Multiplanar, multiecho pulse sequences of the cervical spine were obtained without intravenous contrast. COMPARISON:  Head CT 04/01/2017 FINDINGS: MRI HEAD FINDINGS Brain: There are numerous Mcdonagh patchy acute infarcts predominantly sagittally oriented in the left cerebral hemisphere involving cortex and white matter with greatest involvement of the centrum semiovale suggestive of watershed ischemia. There are a few tiny acute infarcts in a similar distribution in the right cerebral hemisphere predominantly in the frontal and parietal lobes near the vertex. A 6 mm acute infarct is present in the genu of the corpus callosum. A chronic microhemorrhage is noted in the right thalamus. Patchy to confluent T2 hyperintensities in the cerebral white matter bilaterally are nonspecific but  compatible with moderate chronic Harkless vessel ischemia, advanced for age. Chronic lacunar infarcts are present in the cerebellum, bilateral basal ganglia, right internal capsule, and corpus callosum and deep cerebral white matter bilaterally. There is mild cerebral and moderate cerebellar atrophy. No mass, midline shift, or extra-axial fluid collection is seen. Mild to moderate chronic Gonsalez vessel ischemic changes are present in the pons with a tiny chronic infarct noted. The pituitary gland is mildly prominent in size for age, measuring 8 mm in height. Vascular: Major intracranial vascular flow voids are preserved. Skull and upper cervical spine: Unremarkable bone marrow signal. Sinuses/Orbits: Unremarkable orbits. Paranasal sinuses and mastoid air cells are clear. Other: None. MRA HEAD FINDINGS The visualized distal vertebral arteries are patent to the basilar with the left being slightly larger than the right. There is diffuse V4 segment atherosclerotic irregularity with severe stenoses bilaterally, greater on the left. AICAs appear patent proximally with a severe proximal stenosis on the right. SCA origins are patent bilaterally. The basilar artery is patent with mild irregularity but no significant stenosis. Posterior communicating arteries are not identified. PCAs are patent with a severe proximal P2 stenosis on the right and moderate to prominent branch vessel irregularity and narrowing bilaterally. The internal carotid arteries are patent from skullbase to carotid termini with advanced siphon atherosclerosis resulting in severe tandem cavernous stenoses on the right and moderate stenosis on the left. 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 The left A1 segment is patent with mild irregularity proximally but no significant stenosis and supplies the right ACA via the anterior communicating artery. There are severe A2 stenoses bilaterally. The MCAs are patent without evidence of  significant M1 stenosis or proximal branch occlusion. There is moderate MCA branch vessel atherosclerotic irregularity and narrowing bilaterally. No aneurysm is identified. MRI CERVICAL SPINE FINDINGS The study is mildly motion degraded. Alignment: Normal. Vertebrae: No evidence of fracture, osseous lesion, or significant marrow edema. Cord: Normal signal and morphology. Posterior Fossa, vertebral arteries, paraspinal tissues: Cerebral atrophy and infarcts as described above. Preserved vertebral artery flow voids in the neck. Disc levels: C2-3:  Negative. C3-4: Disc bulging and uncovertebral spurring result in mild right neural foraminal stenosis without spinal stenosis. C4-5: Minimal disc bulging and uncovertebral spurring without significant stenosis. C5-6:  Minimal disc bulging without stenosis. C6-7: Capri central disc extrusion with slight caudal migration without stenosis or significant spinal cord mass effect. C7-T1: [place] right paracentral disc protrusion without significant stenosis. IMPRESSION: 1. Patchy acute infarcts in the left greater than right cerebral hemispheres with distribution suggesting watershed ischemia. 2. 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 3. Mild prominence of the pituitary gland for age. Consider laboratory correlation. 4. Advanced intracranial atherosclerosis as above including severe right and moderate left ICA stenoses, severe bilateral vertebral artery stenoses, and severe right P2 PCA stenosis. 5. Mild cervical disc degeneration without high-grade stenosis as above. Electronically Signed   By: Logan Bores M.D.   On: 04/01/2017 17:15   Anti-infectives: Anti-infectives    Start     Dose/Rate Route Frequency Ordered Stop   04/08/17 0000  ceFAZolin (ANCEF) IVPB 1 g/50 mL premix    Comments:  Send with pt to OR   1 g 100 mL/hr over 30 Minutes Intravenous On call 04/07/17 0901 04/09/17 0000      Assessment/Plan: s/p  Procedure(s): TRANSESOPHAGEAL ECHOCARDIOGRAM (TEE) (N/A) Reviewed her CT scan and discussed with the patient. High-grade left carotid stenosis. Plan to proceed with left carotid endarterectomy with Dr. Donzetta Matters tomorrow as planned. Describe the procedure and expected recovery.   LOS: 5 days   Curt Jews 04/07/2017, 9:23 AM

## 2017-04-07 NOTE — Progress Notes (Signed)
PROGRESS NOTE    [name] [name]  TFT:[id number] DOB: 1946-06-24 DOA: 04/01/2017 PCP: Patient, No Pcp Per    Brief Narrative:[name] [name] is a 71 y.o. female who presents with R arm weakness concerning for stroke. Neuro consulted and recommendations given. She was found to have patchyacute infarcts in the left greater than right cerebral hemispheres suggesting watershed ischemia. Carotid duples showing mod left ICA Stenosis, it was followed up with a CTA of the head and neck showing Critical proximal left ICA stenosis due to atheromatous plaque. Vascular consulted for recommendations. Plan to proceed with left carotid endarterectomy with [name] [name] 5/29.  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  Stroke team recommended TEE, loop recorder placement. But patient refused.  PT eval recommended CIR.  LDL is 130. hgba1c is 5.7.  Currently on aspirin, plavix and lipitor. Recommend 3 months of DAPT , followed by plavix.  Carotid duplex showing stenosis on the LEFT ICA. Vascular consulted, and underwent CTA of the head and neck,  Plan to proceed with left carotid endarterectomy with [name] [name] tomorrow as planned.  Hypertensive urgency:  Permissive hypertension.upto 72 hours.  Started her low dose amlodipine with good results.  bp better controlled today.    Hypokalemia: repleted.    DVT prophylaxis: (Lovenox/ Code Status: (Full) Family Communication: daughter at bedside.  Disposition Plan: CIR when stable.   Consultants:   Neurology / cardiology.   Vascular consult   Procedures: echocardiogram.   Venous duplex of the lower extremities  negative for DVT  Carotid duplex showed left ICA stenosis upto 90%  CTA    Antimicrobials: none.   Subjective: Pt sitting up in chair.  No chest pain or sob.  Daughter at bedside.   Objective: Vitals:   04/06/17 2104 04/07/17 0157 04/07/17 0703 04/07/17 0940  BP: (!) 159/52 (!) 167/68 (!) 162/74 (!) 166/51  Pulse: 75 75 74 73  Resp: 16 18 18 18   Temp: 98.5 F (36.9 C) 97.5 F (36.4 C) 97.7 F (36.5 C) 97.6 F (36.4 C)  TempSrc: Oral Oral Oral Oral  SpO2: 99% 100% 100% 100%  Weight:      Height:        Intake/Output Summary (Last 24 hours) at 04/07/17 1138 Last data filed at 04/07/17 1000  Gross per 24 hour  Intake              120 ml  Output                0 ml  Net              120 ml   Filed Weights   04/01/17 0910  Weight: 63.5 kg (140 lb)    Examination:  General exam: Appears calm and comfortable  Respiratory system: Clear to auscultation. Respiratory effort normal. Cardiovascular system: S1 & S2 heard, RRR. No JVD, murmurs, rubs, gallops or clicks. No pedal edema. Gastrointestinal system: Abdomen is nondistended, soft and nontender. No organomegaly or masses felt. Normal bowel sounds heard. Central nervous system: Alert and   confused, right arm strength 2/5. No new deficits.  Extremities: Symmetric 5 x 5 power. Skin: No  rashes, lesions or ulcers  Data Reviewed: I have personally reviewed following labs and imaging studies  CBC:  Recent Labs Lab 04/01/17 0920 04/01/17 0932 04/02/17 0814  WBC 5.6  --  3.6*  NEUTROABS 4.4  --   --   HGB 13.5 13.9 14.8  HCT 42.2 41.0 45.9  MCV 80.7  --  81.2  PLT 161  --  115   Basic Metabolic Panel:  Recent Labs Lab 04/01/17 0920 04/01/17 0932 04/02/17 0814  NA 140 141 140  K 3.2* 3.2* 3.9  CL 106 104 107  CO2 24  --  26  GLUCOSE 119* 117* 93  BUN 9 9 9   CREATININE 0.81 0.80 0.83  CALCIUM 9.1  --  9.0   GFR: Estimated Creatinine Clearance: 65 mL/min (by C-G formula based on SCr of 0.83  mg/dL). Liver Function Tests:  Recent Labs Lab 04/01/17 0920  AST 17  ALT 12*  ALKPHOS 68  BILITOT 0.9  PROT 6.7  ALBUMIN 3.7   No results for input(s): LIPASE, AMYLASE in the last 168 hours. No results for input(s): AMMONIA in the last 168 hours. Coagulation Profile:  Recent Labs Lab 04/01/17 0920  INR 1.08   Cardiac Enzymes:  Recent Labs Lab 04/01/17 1926  TROPONINI <0.03   BNP (last 3 results) No results for input(s): PROBNP in the last 8760 hours. HbA1C: No results for input(s): HGBA1C in the last 72 hours. CBG: No results for input(s): GLUCAP in the last 168 hours. Lipid Profile: No results for input(s): CHOL, HDL, LDLCALC, TRIG, CHOLHDL, LDLDIRECT in the last 72 hours. Thyroid Function Tests: No results for input(s): TSH, T4TOTAL, FREET4, T3FREE, THYROIDAB in the last 72 hours. Anemia Panel: No results for input(s): VITAMINB12, FOLATE, FERRITIN, TIBC, IRON, RETICCTPCT in the last 72 hours. Sepsis Labs: No results for input(s): PROCALCITON, LATICACIDVEN in the last 168 hours.  Recent Results (from the past 240 hour(s))  Urine culture     Status: Abnormal   Collection Time: 04/01/17 12:16 PM  Result Value Ref Range Status   Specimen Description URINE, CATHETERIZED  Final   Special Requests Normal  Final   Culture MULTIPLE SPECIES PRESENT, SUGGEST RECOLLECTION (A)  Final   Report Status 04/02/2017 FINAL  Final    Radiology Studies: No results found.  Scheduled Meds: . amLODipine  2.5 mg Oral Daily  . aspirin  325 mg Oral Daily  . atorvastatin  80 mg Oral q1800  . clopidogrel  75 mg Oral Daily  . enoxaparin (LOVENOX) injection  40 mg Subcutaneous Q24H  . sodium chloride flush  3 mL Intravenous Q12H   Continuous Infusions: . sodium chloride    . [START ON 04/08/2017]  ceFAZolin (ANCEF) IV      LOS: 5 days   Time spent: 31 minutes.   [name] Brakeman, MD [facility] Pager [phone]  If 7PM-7AM, please contact  night-coverage [email or web address] Password [facility] 04/07/2017, 11:38 AM

## 2017-04-07 NOTE — Progress Notes (Signed)
Physical Therapy Treatment Patient Details Name: [name] [name] MRN: [id number] DOB: 29-Mar-1946 Today's Date: 04/07/2017    History of Present Illness Patient is a 71 yo female admitted 04/01/17 with RUE weakness and unsteady gait for several days.  Patient also with hypertensive urgency and hyperglycemia.   MRI of brain - Patchy acute infarcts in the left greater than right cerebral hemispheres with distribution suggesting watershed ischemia.     PMH:  old CVA per chart, HTN    PT Comments    Pt was able to perform transfers and ambulation with increased independence this session. Pt was able to complete higher level balance activity in hall such as side stepping, backwards walking, and stepping over objects with BUE support anteriorly from tech and therapist providing support at the hips with gait belt. Processing time improved to complete tasks. Will continue to follow and progress as able per POC.    Follow Up Recommendations  CIR;Supervision for mobility/OOB     Equipment Recommendations  None recommended by PT (TBD by next venue of care)    Recommendations for Other Services Rehab consult     Precautions / Restrictions Precautions Precautions: Fall Precaution Comments: Rt hemiparesis Restrictions Weight Bearing Restrictions: No    Mobility  Bed Mobility               General bed mobility comments: Pt sitting up EOB finishing a bath when PT arrived. Sitting with posterior lean and unable to correct without assist.   Transfers Overall transfer level: Needs assistance Equipment used: 1 person hand held assist Transfers: Sit to/from Stand Sit to Stand: Min assist         General transfer comment: Min assist for balance support and safety. Placed R hand on arm rest of the chair to stand and pt was able to push up and put some weight through it.  Ambulation/Gait Ambulation/Gait assistance: Min assist;+2 safety/equipment Ambulation Distance (Feet): 100  Feet Assistive device: 1 person hand held assist Gait Pattern/deviations: Step-through pattern;Decreased stride length;Shuffle;Decreased dorsiflexion - right;Decreased step length - right;Trunk flexed Gait velocity: Decreased   General Gait Details: VC's for sequencing, improved posture, and increased step/stride length with more aggressive heel strike. Pt was unable to make corrective changes. Was able to participate in higher level balance activity including side stepping to the R and L, backwards walking, and stepping over an object. Increased assistance reqired for backwards walking and stepping over an object.    Stairs            Wheelchair Mobility    Modified Rankin (Stroke Patients Only) Modified Rankin (Stroke Patients Only) Pre-Morbid Rankin Score: No symptoms Modified Rankin: Moderately severe disability     Balance Overall balance assessment: Needs assistance Sitting-balance support: Single extremity supported;Feet supported Sitting balance-Leahy Scale: Fair     Standing balance support: Single extremity supported Standing balance-Leahy Scale: Poor Standing balance comment: Required use of LUE and mod A to maintain balance during functional mobility                             Cognition Arousal/Alertness: Awake/alert Behavior During Therapy: Flat affect Overall Cognitive Status: Impaired/Different from baseline Area of Impairment: Following commands;Awareness;Problem solving;Attention                 Orientation Level: Disoriented to;Place Current Attention Level: Sustained   Following Commands: Follows one step commands inconsistently;Follows one step commands with increased time   Awareness: Emergent Problem  Solving: Difficulty sequencing;Requires verbal cues;Requires tactile cues;Slow processing;Decreased initiation        Exercises      General Comments        Pertinent Vitals/Pain Pain Assessment: No/denies pain    Home  Living                      Prior Function            PT Goals (current goals can now be found in the care plan section) Acute Rehab PT Goals Patient Stated Goal: To walk; get use of RUE back PT Goal Formulation: With patient/family Time For Goal Achievement: 04/08/17 Potential to Achieve Goals: Good Progress towards PT goals: Progressing toward goals    Frequency    Min 4X/week      PT Plan Current plan remains appropriate    Co-evaluation              AM-PAC PT "6 Clicks" Daily Activity  Outcome Measure  Difficulty turning over in bed (including adjusting bedclothes, sheets and blankets)?: Total Difficulty moving from lying on back to sitting on the side of the bed? : Total Difficulty sitting down on and standing up from a chair with arms (e.g., wheelchair, bedside commode, etc,.)?: Total Help needed moving to and from a bed to chair (including a wheelchair)?: A Little Help needed walking in hospital room?: A Lot Help needed climbing 3-5 steps with a railing? : Total 6 Click Score: 9    End of Session Equipment Utilized During Treatment: Gait belt Activity Tolerance: Patient tolerated treatment well Patient left: in chair;with call bell/phone within reach;with chair alarm set Nurse Communication: Mobility status PT Visit Diagnosis: Unsteadiness on feet (R26.81);Other abnormalities of gait and mobility (R26.89);Hemiplegia and hemiparesis;Other symptoms and signs involving the nervous system (R29.898) Hemiplegia - Right/Left: Right Hemiplegia - dominant/non-dominant: Dominant Hemiplegia - caused by: Cerebral infarction     Time: 6767-2094 PT Time Calculation (min) (ACUTE ONLY): 19 min  Charges:  $Gait Training: 8-22 mins                    G Codes:     Rolinda Roan, PT, DPT Acute Rehabilitation Services Pager: [place] 04/07/2017, 10:39 AM

## 2017-04-07 NOTE — Progress Notes (Signed)
Occupational Therapy Treatment Patient Details Name: [name] [name] MRN: [id number] DOB: Jul 26, 1946 Today's Date: 04/07/2017    History of present illness Patient is a 71 yo female admitted 04/01/17 with RUE weakness and unsteady gait for several days.  Patient also with hypertensive urgency and hyperglycemia.   MRI of brain - Patchy acute infarcts in the left greater than right cerebral hemispheres with distribution suggesting watershed ischemia.     PMH:  old CVA per chart, HTN   OT comments  Pt making steady progress. Focus of session on functional mobility and forced use of RUE using PNF and NDT techniques.RUE Movement improved with input and repetition. Continue to recommend CIR for rehab. Family present during session.   Follow Up Recommendations  CIR;Supervision/Assistance - 24 hour    Equipment Recommendations  Other (comment)    Recommendations for Other Services Rehab consult    Precautions / Restrictions Precautions Precautions: Fall Precaution Comments:       Mobility Bed Mobility Overal bed mobility: Needs Assistance         Sit to supine: Min assist   General bed mobility comments: Facilitation for side lying to supine  Transfers Overall transfer level: Needs assistance Equipment used: 1 person hand held assist Transfers: Sit to/from Stand Sit to Stand: Min assist Stand pivot transfers: Min assist            Balance     Sitting balance-Leahy Scale: Fair Sitting balance - Comments: R bias  Pt able to achieve midline posture but unable to maintain when attention challenged   Standing balance-Leahy Scale: Poor                             ADL either performed or assessed with clinical judgement   ADL                                       Functional mobility during ADLs: Minimal assistance General ADL Comments: Focus of session on facilitation of RUE functional movemetn patterns using PNF and NDT techniques. Movement  improved with proprioceptive input and repetiion of movement patterns.      Vision       Perception     Praxis  apparent motor planning deficits Improved performance during automatic tasks    Cognition Arousal/Alertness: Awake/alert Behavior During Therapy: Flat affect Overall Cognitive Status: Impaired/Different from baseline Area of Impairment: Attention;Following commands;Safety/judgement;Awareness;Problem solving                   Current Attention Level: Selective   Following Commands: Follows one step commands inconsistently   Awareness: Emergent Problem Solving: Difficulty sequencing          Exercises     Shoulder Instructions       General Comments  RUE moving out of synergy pattern. Proximal movement greater than distal movement. Gross grasp/ more difficulty releasing    Pertinent Vitals/ Pain       Pain Assessment: No/denies pain  Home Living                                          Prior Functioning/Environment              Frequency  Min 3X/week  Progress Toward Goals  OT Goals(current goals can now be found in the care plan section)  Progress towards OT goals: Progressing toward goals  Acute Rehab OT Goals Patient Stated Goal: To walk; get use of RUE back OT Goal Formulation: With patient Time For Goal Achievement: 04/16/17 Potential to Achieve Goals: Good ADL Goals Pt Will Perform Grooming: with supervision;sitting Pt Will Perform Upper Body Bathing: with supervision;sitting Pt Will Perform Lower Body Dressing: with min assist;with adaptive equipment;sit to/from stand Pt Will Transfer to Toilet: with min guard assist;stand pivot transfer;ambulating;bedside commode Pt Will Perform Toileting - Clothing Manipulation and hygiene: with min guard assist;sit to/from stand Pt/caregiver will Perform Home Exercise Program: Right Upper extremity;With minimal assist;Increased strength  Plan Discharge plan  remains appropriate;Frequency remains appropriate    Co-evaluation                 AM-PAC PT "6 Clicks" Daily Activity     Outcome Measure   Help from another person eating meals?: A Little Help from another person taking care of personal grooming?: A Little Help from another person toileting, which includes using toliet, bedpan, or urinal?: A Lot Help from another person bathing (including washing, rinsing, drying)?: A Lot Help from another person to put on and taking off regular upper body clothing?: A Little Help from another person to put on and taking off regular lower body clothing?: A Lot 6 Click Score: 15    End of Session Equipment Utilized During Treatment: Gait belt  OT Visit Diagnosis: Hemiplegia and hemiparesis;Other abnormalities of gait and mobility (R26.89);Unsteadiness on feet (R26.81);Muscle weakness (generalized) (M62.81);Cognitive communication deficit (R41.841) Symptoms and signs involving cognitive functions: Cerebral infarction Hemiplegia - Right/Left: Right Hemiplegia - dominant/non-dominant: Dominant Hemiplegia - caused by: Cerebral infarction   Activity Tolerance Patient tolerated treatment well   Patient Left in bed;with call bell/phone within reach;with bed alarm set;with family/visitor present   Nurse Communication Mobility status        Time: 1450-1515 OT Time Calculation (min): 25 min  Charges: OT General Charges $OT Visit: 1 Procedure OT Treatments $Self Care/Home Management : 8-22 mins $Neuromuscular Re-education: 8-22 mins  [facility], OT/L  [phone] 04/07/2017   [name] [name],HILLARY 04/07/2017, 3:20 PM

## 2017-04-08 ENCOUNTER — Inpatient Hospital Stay (HOSPITAL_COMMUNITY): Payer: Medicare Other | Admitting: Anesthesiology

## 2017-04-08 ENCOUNTER — Encounter (HOSPITAL_COMMUNITY)
Admission: EM | Disposition: A | Discharge status: Rehabilitation Facility | Payer: Self-pay | Source: Home / Self Care | Attending: Internal Medicine

## 2017-04-08 HISTORY — PX: PATCH ANGIOPLASTY: SHX6230

## 2017-04-08 HISTORY — PX: ENDARTERECTOMY: SHX5162

## 2017-04-08 LAB — GLUCOSE, CAPILLARY: Glucose-Capillary: 102 mg/dL — ABNORMAL HIGH (ref 65–99)

## 2017-04-08 LAB — BASIC METABOLIC PANEL
Anion gap: 8 (ref 5–15)
BUN: 16 mg/dL (ref 6–20)
CHLORIDE: 105 mmol/L (ref 101–111)
CO2: 26 mmol/L (ref 22–32)
Calcium: 8.9 mg/dL (ref 8.9–10.3)
Creatinine, Ser: 0.75 mg/dL (ref 0.44–1.00)
GFR calc Af Amer: >60 mL/min (ref >60)
GFR calc non Af Amer: >60 mL/min (ref >60)
GLUCOSE: 102 mg/dL — AB (ref 65–99)
POTASSIUM: 3.8 mmol/L (ref 3.5–5.1)
Sodium: 139 mmol/L (ref 135–145)

## 2017-04-08 LAB — PROTIME-INR
INR: 1
Prothrombin Time: 13.2 seconds (ref 11.4–15.2)

## 2017-04-08 LAB — CBC
HEMATOCRIT: 40.5 % (ref 36.0–46.0)
HEMATOCRIT: 40.5 % (ref 36.0–46.0)
HEMOGLOBIN: 12.7 g/dL (ref 12.0–15.0)
Hemoglobin: 12.2 g/dL (ref 12.0–15.0)
MCH: 25.5 pg — ABNORMAL LOW (ref 26.0–34.0)
MCH: 24.7 pg — ABNORMAL LOW (ref 26.0–34.0)
MCHC: 31.4 g/dL (ref 30.0–36.0)
MCHC: 30.1 g/dL (ref 30.0–36.0)
MCV: 81.2 fL (ref 78.0–100.0)
MCV: 82.2 fL (ref 78.0–100.0)
Platelets: 176 10*3/uL (ref 150–400)
Platelets: 161 10*3/uL (ref 150–400)
RBC: 4.99 MIL/uL (ref 3.87–5.11)
RBC: 4.93 MIL/uL (ref 3.87–5.11)
RDW: 14.1 % (ref 11.5–15.5)
RDW: 14 % (ref 11.5–15.5)
WBC: 4.1 10*3/uL (ref 4.0–10.5)
WBC: 7.7 10*3/uL (ref 4.0–10.5)

## 2017-04-08 LAB — CREATININE, SERUM
Creatinine, Ser: 0.78 mg/dL (ref 0.44–1.00)
GFR calc non Af Amer: >60 mL/min (ref >60)

## 2017-04-08 LAB — MRSA PCR SCREENING: MRSA by PCR: NEGATIVE

## 2017-04-08 LAB — POCT ACTIVATED CLOTTING TIME: Activated Clotting Time: 252 seconds

## 2017-04-08 SURGERY — ENDARTERECTOMY, CAROTID
Anesthesia: General | Site: Neck | Laterality: Left

## 2017-04-08 MED ORDER — LACTATED RINGERS IV SOLN
INTRAVENOUS | Status: DC | PRN
  Administered 2017-04-08 (×2): via INTRAVENOUS

## 2017-04-08 MED ORDER — MAGNESIUM SULFATE 2 GM/50ML IV SOLN: 2.0000 g | Freq: Every day | INTRAVENOUS | Status: DC | PRN

## 2017-04-08 MED ORDER — PHENYLEPHRINE HCL 10 MG/ML IJ SOLN
INTRAMUSCULAR | Status: DC | PRN
  Administered 2017-04-08 (×3): 40 ug via INTRAVENOUS
  Administered 2017-04-08: 80 ug via INTRAVENOUS

## 2017-04-08 MED ORDER — CEFUROXIME SODIUM 1.5 G IV SOLR
1.5000 g | INTRAVENOUS | Status: AC
  Administered 2017-04-08: 1.5 g via INTRAVENOUS
  Filled 2017-04-08: qty 1.5

## 2017-04-08 MED ORDER — LIDOCAINE HCL 1 % IJ SOLN
INTRAMUSCULAR | Status: AC
  Filled 2017-04-08: qty 20

## 2017-04-08 MED ORDER — PHENYLEPHRINE HCL 10 MG/ML IJ SOLN
INTRAMUSCULAR | Status: DC | PRN
  Administered 2017-04-08: 50 ug/min via INTRAVENOUS

## 2017-04-08 MED ORDER — SODIUM CHLORIDE 0.9 % IV SOLN
INTRAVENOUS | Status: DC
  Administered 2017-04-08: 17:00:00 via INTRAVENOUS

## 2017-04-08 MED ORDER — LABETALOL HCL 5 MG/ML IV SOLN: 10.0000 mg | INTRAVENOUS | Status: DC | PRN

## 2017-04-08 MED ORDER — SUGAMMADEX SODIUM 200 MG/2ML IV SOLN
INTRAVENOUS | Status: DC | PRN
  Administered 2017-04-08: 127 mg via INTRAVENOUS

## 2017-04-08 MED ORDER — 0.9 % SODIUM CHLORIDE (POUR BTL) OPTIME
TOPICAL | Status: DC | PRN
  Administered 2017-04-08 (×2): 1000 mL

## 2017-04-08 MED ORDER — LIDOCAINE HCL (PF) 1 % IJ SOLN
INTRAMUSCULAR | Status: DC | PRN
Start: 2017-04-08 — End: 2017-04-08
  Administered 2017-04-08: 20 mL

## 2017-04-08 MED ORDER — LIDOCAINE HCL (CARDIAC) 20 MG/ML IV SOLN
INTRAVENOUS | Status: DC | PRN
  Administered 2017-04-08: 100 mg via INTRAVENOUS
  Administered 2017-04-08: 50 mg via INTRAVENOUS

## 2017-04-08 MED ORDER — LACTATED RINGERS IV SOLN
INTRAVENOUS | Status: DC | PRN
  Administered 2017-04-08: 07:00:00 via INTRAVENOUS

## 2017-04-08 MED ORDER — PROPOFOL 10 MG/ML IV BOLUS
INTRAVENOUS | Status: AC
  Filled 2017-04-08: qty 20

## 2017-04-08 MED ORDER — OXYCODONE-ACETAMINOPHEN 5-325 MG PO TABS: 1.0000 | ORAL_TABLET | ORAL | Status: DC | PRN

## 2017-04-08 MED ORDER — SODIUM CHLORIDE 0.9 % IV SOLN
0.0125 ug/kg/min | INTRAVENOUS | Status: DC
  Filled 2017-04-08: qty 2000

## 2017-04-08 MED ORDER — HEPARIN SODIUM (PORCINE) 1000 UNIT/ML IJ SOLN
INTRAMUSCULAR | Status: DC | PRN
  Administered 2017-04-08: 7000 [IU] via INTRAVENOUS

## 2017-04-08 MED ORDER — SODIUM CHLORIDE 0.9 % IV SOLN
INTRAVENOUS | Status: DC | PRN
  Administered 2017-04-08: 08:00:00

## 2017-04-08 MED ORDER — SODIUM CHLORIDE 0.9 % IV SOLN: 500.0000 mL | Freq: Once | INTRAVENOUS | Status: DC | PRN

## 2017-04-08 MED ORDER — DOCUSATE SODIUM 100 MG PO CAPS
100.0000 mg | ORAL_CAPSULE | Freq: Every day | ORAL | Status: DC
  Administered 2017-04-09: 100 mg via ORAL
  Filled 2017-04-08: qty 1

## 2017-04-08 MED ORDER — PROMETHAZINE HCL 25 MG/ML IJ SOLN: 6.2500 mg | INTRAMUSCULAR | Status: DC | PRN

## 2017-04-08 MED ORDER — FENTANYL CITRATE (PF) 250 MCG/5ML IJ SOLN
INTRAMUSCULAR | Status: AC
  Filled 2017-04-08: qty 5

## 2017-04-08 MED ORDER — ROCURONIUM BROMIDE 100 MG/10ML IV SOLN
INTRAVENOUS | Status: DC | PRN
  Administered 2017-04-08: 50 mg via INTRAVENOUS

## 2017-04-08 MED ORDER — ALUM & MAG HYDROXIDE-SIMETH 200-200-20 MG/5ML PO SUSP: 15.0000 mL | ORAL | Status: DC | PRN

## 2017-04-08 MED ORDER — MORPHINE SULFATE (PF) 4 MG/ML IV SOLN: 1.0000 mg | INTRAVENOUS | Status: DC | PRN

## 2017-04-08 MED ORDER — DEXTROSE 5 % IV SOLN
1.5000 g | Freq: Two times a day (BID) | INTRAVENOUS | Status: AC
  Administered 2017-04-08 – 2017-04-09 (×2): 1.5 g via INTRAVENOUS
  Filled 2017-04-08 (×2): qty 1.5

## 2017-04-08 MED ORDER — PROTAMINE SULFATE 10 MG/ML IV SOLN
INTRAVENOUS | Status: DC | PRN
  Administered 2017-04-08: 25 mg via INTRAVENOUS

## 2017-04-08 MED ORDER — GUAIFENESIN-DM 100-10 MG/5ML PO SYRP: 15.0000 mL | ORAL_SOLUTION | ORAL | Status: DC | PRN

## 2017-04-08 MED ORDER — HYDRALAZINE HCL 20 MG/ML IJ SOLN: 5.0000 mg | INTRAMUSCULAR | Status: DC | PRN

## 2017-04-08 MED ORDER — PANTOPRAZOLE SODIUM 40 MG PO TBEC
40.0000 mg | DELAYED_RELEASE_TABLET | Freq: Every day | ORAL | Status: DC
  Administered 2017-04-08 – 2017-04-09 (×2): 40 mg via ORAL
  Filled 2017-04-08 (×2): qty 1

## 2017-04-08 MED ORDER — REMIFENTANIL HCL 1 MG IV SOLR
INTRAVENOUS | Status: DC | PRN
  Administered 2017-04-08: .05 ug/kg/min via INTRAVENOUS

## 2017-04-08 MED ORDER — BISACODYL 5 MG PO TBEC: 5.0000 mg | DELAYED_RELEASE_TABLET | Freq: Every day | ORAL | Status: DC | PRN

## 2017-04-08 MED ORDER — PROPOFOL 10 MG/ML IV BOLUS
INTRAVENOUS | Status: DC | PRN
  Administered 2017-04-08: 200 mg via INTRAVENOUS

## 2017-04-08 MED ORDER — POTASSIUM CHLORIDE CRYS ER 20 MEQ PO TBCR: 20.0000 meq | EXTENDED_RELEASE_TABLET | Freq: Every day | ORAL | Status: DC | PRN

## 2017-04-08 MED ORDER — PHENOL 1.4 % MT LIQD: 1.0000 | OROMUCOSAL | Status: DC | PRN

## 2017-04-08 MED ORDER — HYDROMORPHONE HCL 1 MG/ML IJ SOLN
0.2500 mg | INTRAMUSCULAR | Status: DC | PRN
Start: 2017-04-08 — End: 2017-04-08

## 2017-04-08 MED ORDER — GLYCOPYRROLATE 0.2 MG/ML IJ SOLN
INTRAMUSCULAR | Status: DC | PRN
  Administered 2017-04-08: 0.2 mg via INTRAVENOUS

## 2017-04-08 MED ORDER — CHLORHEXIDINE GLUCONATE CLOTH 2 % EX PADS
6.0000 | MEDICATED_PAD | Freq: Once | CUTANEOUS | Status: AC
  Administered 2017-04-08: 6 via TOPICAL

## 2017-04-08 SURGICAL SUPPLY — 53 items
ADH SKN CLS APL DERMABOND .7 (GAUZE/BANDAGES/DRESSINGS) ×2
ADPR TBG 2 MALE LL ART (MISCELLANEOUS) ×2
CANISTER SUCT 3000ML PPV (MISCELLANEOUS) ×4 IMPLANT
CATH ROBINSON RED A/P 18FR (CATHETERS) ×4 IMPLANT
CLIP TI MEDIUM 24 (CLIP) ×4 IMPLANT
CLIP TI WIDE RED SMALL 24 (CLIP) ×4 IMPLANT
COVER TRANSDUCER ULTRASND GEL (DRAPE) ×2 IMPLANT
CRADLE DONUT ADULT HEAD (MISCELLANEOUS) ×4 IMPLANT
DERMABOND ADVANCED (GAUZE/BANDAGES/DRESSINGS) ×2
DERMABOND ADVANCED .7 DNX12 (GAUZE/BANDAGES/DRESSINGS) ×2 IMPLANT
DRAIN CHANNEL 15F RND FF W/TCR (WOUND CARE) IMPLANT
ELECT CAUTERY BLADE 6.4 (BLADE) IMPLANT
ELECT REM PT RETURN 9FT ADLT (ELECTROSURGICAL) ×4
ELECTRODE REM PT RTRN 9FT ADLT (ELECTROSURGICAL) ×2 IMPLANT
EVACUATOR SILICONE 100CC (DRAIN) IMPLANT
GLOVE BIO SURGEON STRL SZ 6.5 (GLOVE) ×1 IMPLANT
GLOVE BIO SURGEON STRL SZ7.5 (GLOVE) ×4 IMPLANT
GLOVE BIO SURGEONS STRL SZ 6.5 (GLOVE) ×1
GLOVE BIOGEL PI IND STRL 6.5 (GLOVE) IMPLANT
GLOVE BIOGEL PI INDICATOR 6.5 (GLOVE) ×2
GOWN STRL REUS W/ TWL LRG LVL3 (GOWN DISPOSABLE) ×4 IMPLANT
GOWN STRL REUS W/ TWL XL LVL3 (GOWN DISPOSABLE) ×2 IMPLANT
GOWN STRL REUS W/TWL LRG LVL3 (GOWN DISPOSABLE) ×12
GOWN STRL REUS W/TWL XL LVL3 (GOWN DISPOSABLE) ×4
HEMOSTAT SNOW SURGICEL 2X4 (HEMOSTASIS) IMPLANT
INSERT FOGARTY SM (MISCELLANEOUS) ×4 IMPLANT
IV ADAPTER SYR DOUBLE MALE LL (MISCELLANEOUS) ×4 IMPLANT
KIT BASIN OR (CUSTOM PROCEDURE TRAY) ×4 IMPLANT
KIT ROOM TURNOVER OR (KITS) ×4 IMPLANT
KIT SHUNT ARGYLE CAROTID ART 6 (VASCULAR PRODUCTS) ×2 IMPLANT
NDL HYPO 25GX1X1/2 BEV (NEEDLE) IMPLANT
NDL SPNL 20GX3.5 QUINCKE YW (NEEDLE) ×2 IMPLANT
NEEDLE HYPO 25GX1X1/2 BEV (NEEDLE) IMPLANT
NEEDLE SPNL 20GX3.5 QUINCKE YW (NEEDLE) ×4 IMPLANT
PACK CAROTID (CUSTOM PROCEDURE TRAY) ×4 IMPLANT
PAD ARMBOARD 7.5X6 YLW CONV (MISCELLANEOUS) ×8 IMPLANT
PATCH VASC XENOSURE 1CMX6CM (Vascular Products) ×4 IMPLANT
PATCH VASC XENOSURE 1X6 (Vascular Products) IMPLANT
PENCIL BUTTON HOLSTER BLD 10FT (ELECTRODE) IMPLANT
SHUNT CAROTID BYPASS 10 (VASCULAR PRODUCTS) IMPLANT
SHUNT CAROTID BYPASS 12FRX15.5 (VASCULAR PRODUCTS) IMPLANT
STOPCOCK 4 WAY LG BORE MALE ST (IV SETS) IMPLANT
SUT ETHILON 3 0 PS 1 (SUTURE) IMPLANT
SUT PROLENE 6 0 BV (SUTURE) ×6 IMPLANT
SUT PROLENE 7 0 BV 1 (SUTURE) IMPLANT
SUT SILK 3 0 (SUTURE)
SUT SILK 3-0 18XBRD TIE 12 (SUTURE) IMPLANT
SUT VIC AB 3-0 SH 27 (SUTURE) ×8
SUT VIC AB 3-0 SH 27X BRD (SUTURE) ×4 IMPLANT
SUT VICRYL 4-0 PS2 18IN ABS (SUTURE) ×4 IMPLANT
SYR CONTROL 10ML LL (SYRINGE) IMPLANT
TUBING ART PRESS 48 MALE/FEM (TUBING) ×4 IMPLANT
WATER STERILE IRR 1000ML POUR (IV SOLUTION) ×4 IMPLANT

## 2017-04-08 NOTE — Progress Notes (Signed)
   Progress Note    04/08/2017 7:10 AM Day of Surgery  Subjective:  Feeling ok, no questions regarding operation  Vitals:   04/08/17 0158 04/08/17 0430  BP: (!) 143/64 (!) 153/63  Pulse: 75 68  Resp: 18 18  Temp: 98.2 F (36.8 C) 98.1 F (36.7 C)    Physical Exam: aaox3 CN in tact Non labored respirations rrr Abdomen is soft Right arm is flaccid, moving lue, ble  CBC    Component Value Date/Time   WBC 4.1 04/08/2017 0459   RBC 4.99 04/08/2017 0459   HGB 12.7 04/08/2017 0459   HCT 40.5 04/08/2017 0459   PLT 176 04/08/2017 0459   MCV 81.2 04/08/2017 0459   MCH 25.5 (L) 04/08/2017 0459   MCHC 31.4 04/08/2017 0459   RDW 14.1 04/08/2017 0459   LYMPHSABS 0.8 04/01/2017 0920   MONOABS 0.3 04/01/2017 0920   EOSABS 0.1 04/01/2017 0920   BASOSABS 0.0 04/01/2017 0920    BMET    Component Value Date/Time   NA 139 04/08/2017 0459   K 3.8 04/08/2017 0459   CL 105 04/08/2017 0459   CO2 26 04/08/2017 0459   GLUCOSE 102 (H) 04/08/2017 0459   BUN 16 04/08/2017 0459   CREATININE 0.75 04/08/2017 0459   CALCIUM 8.9 04/08/2017 0459   GFRNONAA >60 04/08/2017 0459   GFRAA >60 04/08/2017 0459    INR    Component Value Date/Time   INR 1.00 04/08/2017 0459     Intake/Output Summary (Last 24 hours) at 04/08/17 0710 Last data filed at 04/07/17 1000  Gross per 24 hour  Intake              120 ml  Output                0 ml  Net              120 ml     Assessment:  71 y.o. female is s/p watershed infarcts L > R with severe stenosis of left ica  Plan: OR today for Left CEA Discussed risks and benefits and she agrees to proceed. Daughter will here per patient but currently nobody at bedside.    Moranda Billiot C. Donzetta Matters, MD [facility] Office: [phone] Pager: [phone]  04/08/2017 7:10 AM

## 2017-04-08 NOTE — Progress Notes (Signed)
PROGRESS NOTE    [name] [name]  QZE:[id number] DOB: May 27, 1946 DOA: 04/01/2017 PCP: Patient, No Pcp Per   Brief Narrative:[name] [name] is a 71 y.o. female who presents with R arm weakness concerning for stroke. Neuro consulted and recommendations given. She was found to have patchyacute infarcts in the left greater than right cerebral hemispheres suggesting watershed ischemia. Carotid duples showing mod left ICA Stenosis, it was followed up with a CTA of the head and neck showing Critical proximal left ICA stenosis due to atheromatous plaque. Vascular consulted for recommendations. Plan to proceed with left carotid endarterectomy with Dr. Donzetta Matters 5/29.  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  Stroke team recommended TEE, loop recorder placement. But patient refused.  PT eval recommended CIR.  Hopefully will be medically ready in next couple of days.   LDL is 130. hgba1c is 5.7.  Currently on aspirin, plavix and lipitor. Recommend 3 months of DAPT , followed by plavix.  Carotid duplex showing stenosis on the LEFT ICA. Vascular consulted, and underwent CTA of the head and neck,  POD#0 s/p left carotid endarterectomy with Dr. Donzetta Matters - post op care per vascular surgery.   Hypertensive urgency: resolved now Permissive hypertension up to 72 hours.  Started her low dose amlodipine with good results.  bp better controlled    Hypokalemia: repleted.   DVT prophylaxis: (Lovenox/ Code Status: (Full) Family Communication: daughter at bedside.  Disposition Plan: CIR when stable.   Consultants:   Neurology / cardiology.   Vascular  consult   Procedures: echocardiogram.   Venous duplex of the lower extremities negative for DVT  Carotid duplex showed left ICA stenosis upto 90%  CTA    Antimicrobials: none.   Subjective: Pt seen just recently from PACU after surgery this morning.   Daughter at bedside.   Objective: Vitals:   04/08/17 1315 04/08/17 1330 04/08/17 1345 04/08/17 1418  BP: (!) 116/52  (!) 120/48 (!) 118/51  Pulse: 82 85 81 81  Resp: 18 12 15 13   Temp:    97 F (36.1 C)  TempSrc:      SpO2: 98% 99% 99%   Weight:      Height:        Intake/Output Summary (Last 24 hours) at 04/08/17 1511 Last data filed at 04/08/17 0923  Gross per 24 hour  Intake             1825 ml  Output               50 ml  Net             1775 ml   Filed Weights   04/01/17 0910  Weight: 63.5 kg (140 lb)   Examination:  General exam: Appears calm and comfortable  Respiratory system: Clear to auscultation. Respiratory effort normal. Cardiovascular system: S1 & S2 heard, RRR. No JVD, murmurs, rubs, gallops or clicks. No pedal edema. Gastrointestinal system: Abdomen is nondistended, soft and nontender. No organomegaly or masses felt. Normal bowel sounds heard. Central nervous system: sleepy and confused at times, right arm strength 2/5. No new deficits.  Extremities: Symmetric 5 x 5 power. Skin:  No rashes, lesions or ulcers  Data Reviewed: I have personally reviewed following labs and imaging studies  CBC:  Recent Labs Lab 04/02/17 0814 04/08/17 0459  WBC 3.6* 4.1  HGB 14.8 12.7  HCT 45.9 40.5  MCV 81.2 81.2  PLT 172 748   Basic Metabolic Panel:  Recent Labs Lab 04/02/17 0814 04/08/17 0459  NA 140 139  K 3.9 3.8  CL 107 105  CO2 26 26  GLUCOSE 93 102*  BUN 9 16  CREATININE 0.83 0.75  CALCIUM 9.0 8.9   GFR: Estimated Creatinine Clearance: 67.5 mL/min (by C-G formula based on SCr of 0.75 mg/dL). Liver Function Tests: No results for input(s): AST, ALT, ALKPHOS, BILITOT, PROT, ALBUMIN in  the last 168 hours. No results for input(s): LIPASE, AMYLASE in the last 168 hours. No results for input(s): AMMONIA in the last 168 hours. Coagulation Profile:  Recent Labs Lab 04/08/17 0459  INR 1.00   Cardiac Enzymes:  Recent Labs Lab 04/01/17 1926  TROPONINI <0.03   BNP (last 3 results) No results for input(s): PROBNP in the last 8760 hours. HbA1C: No results for input(s): HGBA1C in the last 72 hours. CBG:  Recent Labs Lab 04/08/17 1008  GLUCAP 102*   Lipid Profile: No results for input(s): CHOL, HDL, LDLCALC, TRIG, CHOLHDL, LDLDIRECT in the last 72 hours. Thyroid Function Tests: No results for input(s): TSH, T4TOTAL, FREET4, T3FREE, THYROIDAB in the last 72 hours. Anemia Panel: No results for input(s): VITAMINB12, FOLATE, FERRITIN, TIBC, IRON, RETICCTPCT in the last 72 hours. Sepsis Labs: No results for input(s): PROCALCITON, LATICACIDVEN in the last 168 hours.  Recent Results (from the past 240 hour(s))  Urine culture     Status: Abnormal   Collection Time: 04/01/17 12:16 PM  Result Value Ref Range Status   Specimen Description URINE, CATHETERIZED  Final   Special Requests Normal  Final   Culture MULTIPLE SPECIES PRESENT, SUGGEST RECOLLECTION (A)  Final   Report Status 04/02/2017 FINAL  Final    Radiology Studies: No results found.  Scheduled Meds: . amLODipine  2.5 mg Oral Daily  . aspirin  325 mg Oral Daily  . atorvastatin  80 mg Oral q1800  . clopidogrel  75 mg Oral Daily  . enoxaparin (LOVENOX) injection  40 mg Subcutaneous Q24H  . sodium chloride flush  3 mL Intravenous Q12H   Continuous Infusions: . sodium chloride    . sodium chloride      LOS: 6 days   Critical Care Time spent: 33 minutes.   [name] Brakeman, MD [facility] Pager [phone]  If 7PM-7AM, please contact night-coverage [email or web address] Password [email or web address] 04/08/2017, 3:11 PM

## 2017-04-08 NOTE — Anesthesia Postprocedure Evaluation (Addendum)
Anesthesia Post Note  Patient: [name] [name]  Procedure(s) Performed: Procedure(s) (LRB): ENDARTERECTOMY CAROTID (Left) PATCH ANGIOPLASTY Left Carotid (Left)  Patient location during evaluation: PACU Anesthesia Type: General Level of consciousness: awake and oriented Pain management: pain level controlled Vital Signs Assessment: post-procedure vital signs reviewed and stable Respiratory status: spontaneous breathing, nonlabored ventilation, respiratory function stable and patient connected to nasal cannula oxygen Cardiovascular status: blood pressure returned to baseline and stable Postop Assessment: no signs of nausea or vomiting Anesthetic complications: no       Last Vitals:  Vitals:   04/08/17 1130 04/08/17 1145  BP: (!) 120/50 (!) 118/49  Pulse: 78 75  Resp: 12 12  Temp:      Last Pain:  Vitals:   04/08/17 1130  TempSrc:   PainSc: Asleep                 Sharan Mcenaney,JAMES TERRILL

## 2017-04-08 NOTE — Progress Notes (Signed)
I updated [name] [name] with [employer] that pt. remains on the acute side of the hospital due to having a left CEA today.  Will continue to follow for potential admission to CIR in next couple of days pending medical readiness.  Please call if questions.  Makena Admissions Coordinator Cell [phone] Office [phone]

## 2017-04-08 NOTE — Op Note (Signed)
    Patient name: [name] [name] MRN: [id number] DOB: [birth year] Sex: female   04/08/2017 Pre-operative Diagnosis: symptomatic left carotid stenosis Post-operative diagnosis:  Same Surgeon:  Erlene Quan C. Donzetta Matters, MD Assistant: Gerri Lins, PA Procedure Performed: Left carotid endarterectomy with bovine pericardial patch angioplasty  Indications:  71 year old female admitted with bilateral watershed strokes found to have severe stenosis of her left internal carotid artery and indicated for the above procedure.  Findings: The focal stenosis at the origin of the internal carotid artery. The common and distal internal carotid arteries were actually free of disease. The external carotid artery had significant disease but at the end did have significant backbleeding.   Procedure:  The patient was identified in the holding area and taken to the operating room where she was placed supine on the operating table general endotracheal anesthesia was induced she was given antibiotics and sterilely prepped and draped in her left neck and the usual fashion. Timeout was called we then began with an longitudinal incision along the anterior border of the stomach mastoid muscle. Dissection down through the platysma and the subcutaneous tissue. There were several veins which were divided between ties. We then verified our ansa cervicalis and protected this. We then encircled our common carotid artery with umbilical tape. Patient was heparinized with 7000 units of heparin and ACT returned 250. We then dissected out our external and superior thyroidal arteries and encircled these with Vesseloops. Our was identified and protected and we then encircled the normal-appearing internal carotid artery placed a vessel loop around this. A 10 French shunt was then prepared. The internal artery was then clamped followed by the common and external arteries. We then opened the vessel longitudinally from the common onto the internal carotid  arteries were reidentified are severe stenosis which was soft appearing plaque. A 10 French shunt was then placed into the internal carotid artery and allowed to back bleed. He was then placed into the common carotid artery and Doppler confirmed flow. We then performed endarterectomy of the internal down onto the common and we had good feathering proximally and distally. We then sewed bovine pericardial patch in place with 6-0 Prolene suture. Prior to completing the anastomosis we'll backbleeding of vulvar vessels. We then removed the shunt from the internal reclamped this followed by the common and it was also clamped. The lumen was flushed with heparinized saline and the anastomosis was completed. We then released our clamps and the reverse order the external followed by common and the internal after several cardiac cycles. Doppler confirmed continuous diastolic flow and are internal. There was a high resistance flow in the external but it did have continuous flow throughout. Satisfied with this patient was given 50 mg of protamine which he tolerated well. We obtained hemostasis throughout the wound closed in 2 layers with the platysma 3-0 Vicryl the skin 4-0 Monocryl and Indermil placed above that. She was then allowed to awaken from anesthesia which did take several minutes. An ultrasound probe was used to evaluate the carotid artery at bedside and it was patent. Ultimately she did open her eyes spontaneously and was able to move her bilateral lower and right upper extremity consistent with preop exam. She was transferred to pacu in stable condition.   Blood loss: 100cc     Adarian Bur C. Donzetta Matters, MD [facility] Office: [phone] Pager: [phone]

## 2017-04-08 NOTE — Anesthesia Preprocedure Evaluation (Addendum)
Anesthesia Evaluation  Patient identified by MRN, date of birth, ID band Patient awake    Reviewed: Allergy & Precautions, NPO status , Patient's Chart, lab work & pertinent test results  Airway Mallampati: II  TM Distance: >3 FB Neck ROM: Full    Dental  (+) Teeth Intact, Dental Advisory Given   Pulmonary neg pulmonary ROS,    breath sounds clear to auscultation       Cardiovascular hypertension,  Rhythm:Regular Rate:Normal  ECHO noted   Neuro/Psych CVA    GI/Hepatic negative GI ROS, Neg liver ROS,   Endo/Other  Possible DM  Renal/GU negative Renal ROS     Musculoskeletal   Abdominal   Peds  Hematology negative hematology ROS (+)   Anesthesia Other Findings   Reproductive/Obstetrics                           Anesthesia Physical Anesthesia Plan  ASA: III  Anesthesia Plan: General   Post-op Pain Management:    Induction: Intravenous  Airway Management Planned: Oral ETT  Additional Equipment: Arterial line  Intra-op Plan:   Post-operative Plan: Extubation in OR  Informed Consent: I have reviewed the patients History and Physical, chart, labs and discussed the procedure including the risks, benefits and alternatives for the proposed anesthesia with the patient or authorized representative who has indicated his/her understanding and acceptance.   Dental advisory given  Plan Discussed with: CRNA  Anesthesia Plan Comments:        Anesthesia Quick Evaluation

## 2017-04-08 NOTE — Transfer of Care (Signed)
Immediate Anesthesia Transfer of Care Note  Patient: [name] [name]  Procedure(s) Performed: Procedure(s): ENDARTERECTOMY CAROTID (Left) PATCH ANGIOPLASTY Left Carotid (Left)  Patient Location: PACU  Anesthesia Type:General  Level of Consciousness: awake, alert  and patient cooperative  Airway & Oxygen Therapy: Patient Spontanous Breathing and Patient connected to nasal cannula oxygen  Post-op Assessment: Report given to RN and Post -op Vital signs reviewed and stable  Post vital signs: Reviewed and stable  Last Vitals:  Vitals:   04/08/17 0430 04/08/17 1000  BP: (!) 153/63 132/65  Pulse: 68 89  Resp: 18 15  Temp: 36.7 C 36.9 C    Last Pain:  Vitals:   04/08/17 0430  TempSrc: Oral  PainSc:          Complications: No apparent anesthesia complications

## 2017-04-08 NOTE — Progress Notes (Signed)
  [facility] Day [facility] Note  Subjective:  Having a sore throat. No other complaints.   Vitals:   04/08/17 1345 04/08/17 1418  BP: (!) 120/48 (!) 118/51  Pulse: 81 81  Resp: 15 13  Temp:  97 F (36.1 C)   Left neck incision without hematoma.  Smile symmetric. Tongue midline.  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 Normal strength left upper and bilateral lower extremities  Assessment/Plan:  This is a 71 y.o. female who is s/p left carotid endarterectomy  Doing well post-op No neurological deficits.   Virgina Jock, [place] Pager: [phone] 04/08/2017 3:17 PM

## 2017-04-08 NOTE — Anesthesia Procedure Notes (Signed)
Procedure Name: Intubation Date/Time: 04/08/2017 7:42 AM Performed by: Neldon Newport Pre-anesthesia Checklist: Timeout performed, Patient being monitored, Suction available, Emergency Drugs available and Patient identified Patient Re-evaluated:Patient Re-evaluated prior to inductionOxygen Delivery Method: Circle system utilized Preoxygenation: Pre-oxygenation with 100% oxygen Intubation Type: IV induction Ventilation: Mask ventilation without difficulty and Oral airway inserted - appropriate to patient size Laryngoscope Size: Mac and 3 Grade View: Grade I Tube type: Oral Tube size: 7.0 mm Number of attempts: 1 Placement Confirmation: breath sounds checked- equal and bilateral,  positive ETCO2 and ETT inserted through vocal cords under direct vision Secured at: 22 cm Tube secured with: Tape Dental Injury: Teeth and Oropharynx as per pre-operative assessment

## 2017-04-09 ENCOUNTER — Inpatient Hospital Stay (HOSPITAL_COMMUNITY)
Admission: RE | Admit: 2017-04-09 | Discharge: 2017-04-20 | DRG: 057 | Disposition: A | Discharge status: Home Health | Payer: Medicare Other | Source: Intra-hospital | Attending: Physical Medicine & Rehabilitation | Admitting: Physical Medicine & Rehabilitation

## 2017-04-09 ENCOUNTER — Telehealth: Payer: Self-pay | Admitting: Vascular Surgery

## 2017-04-09 ENCOUNTER — Encounter (HOSPITAL_COMMUNITY): Payer: Self-pay

## 2017-04-09 DIAGNOSIS — Z79899 Other long term (current) drug therapy: Secondary | ICD-10-CM

## 2017-04-09 DIAGNOSIS — G8918 Other acute postprocedural pain: Secondary | ICD-10-CM

## 2017-04-09 DIAGNOSIS — I69351 Hemiplegia and hemiparesis following cerebral infarction affecting right dominant side: Secondary | ICD-10-CM | POA: Diagnosis not present

## 2017-04-09 DIAGNOSIS — I10 Essential (primary) hypertension: Secondary | ICD-10-CM

## 2017-04-09 DIAGNOSIS — I69359 Hemiplegia and hemiparesis following cerebral infarction affecting unspecified side: Secondary | ICD-10-CM

## 2017-04-09 DIAGNOSIS — I679 Cerebrovascular disease, unspecified: Secondary | ICD-10-CM

## 2017-04-09 DIAGNOSIS — Z9889 Other specified postprocedural states: Secondary | ICD-10-CM

## 2017-04-09 DIAGNOSIS — I6522 Occlusion and stenosis of left carotid artery: Secondary | ICD-10-CM | POA: Diagnosis present

## 2017-04-09 DIAGNOSIS — I69319 Unspecified symptoms and signs involving cognitive functions following cerebral infarction: Secondary | ICD-10-CM

## 2017-04-09 DIAGNOSIS — I169 Hypertensive crisis, unspecified: Secondary | ICD-10-CM | POA: Diagnosis present

## 2017-04-09 DIAGNOSIS — E785 Hyperlipidemia, unspecified: Secondary | ICD-10-CM | POA: Diagnosis present

## 2017-04-09 DIAGNOSIS — I63413 Cerebral infarction due to embolism of bilateral middle cerebral arteries: Secondary | ICD-10-CM

## 2017-04-09 DIAGNOSIS — I638 Other cerebral infarction: Secondary | ICD-10-CM | POA: Diagnosis not present

## 2017-04-09 DIAGNOSIS — I6389 Other cerebral infarction: Secondary | ICD-10-CM

## 2017-04-09 LAB — CBC
HEMATOCRIT: 37.7 % (ref 36.0–46.0)
Hemoglobin: 11.6 g/dL — ABNORMAL LOW (ref 12.0–15.0)
MCH: 25.4 pg — ABNORMAL LOW (ref 26.0–34.0)
MCHC: 30.8 g/dL (ref 30.0–36.0)
MCV: 82.5 fL (ref 78.0–100.0)
PLATELETS: 167 10*3/uL (ref 150–400)
RBC: 4.57 MIL/uL (ref 3.87–5.11)
RDW: 14.5 % (ref 11.5–15.5)
WBC: 4.7 10*3/uL (ref 4.0–10.5)

## 2017-04-09 LAB — BASIC METABOLIC PANEL
ANION GAP: 7 (ref 5–15)
BUN: 9 mg/dL (ref 6–20)
CO2: 26 mmol/L (ref 22–32)
Calcium: 8.7 mg/dL — ABNORMAL LOW (ref 8.9–10.3)
Chloride: 107 mmol/L (ref 101–111)
Creatinine, Ser: 0.79 mg/dL (ref 0.44–1.00)
GFR calc Af Amer: >60 mL/min (ref >60)
GFR calc non Af Amer: >60 mL/min (ref >60)
GLUCOSE: 94 mg/dL (ref 65–99)
POTASSIUM: 3.9 mmol/L (ref 3.5–5.1)
Sodium: 140 mmol/L (ref 135–145)

## 2017-04-09 MED ORDER — PANTOPRAZOLE SODIUM 40 MG PO TBEC
40.0000 mg | DELAYED_RELEASE_TABLET | Freq: Every day | ORAL | Status: DC
  Administered 2017-04-09 – 2017-04-20 (×12): 40 mg via ORAL
  Filled 2017-04-09 (×12): qty 1

## 2017-04-09 MED ORDER — OXYCODONE-ACETAMINOPHEN 5-325 MG PO TABS: 1.0000 | ORAL_TABLET | ORAL | Status: DC | PRN

## 2017-04-09 MED ORDER — ASPIRIN 325 MG PO TABS
325.0000 mg | ORAL_TABLET | Freq: Every day | ORAL | Status: DC
  Administered 2017-04-10 – 2017-04-20 (×11): 325 mg via ORAL
  Filled 2017-04-09 (×11): qty 1

## 2017-04-09 MED ORDER — ONDANSETRON HCL 4 MG/2ML IJ SOLN
4.0000 mg | Freq: Once | INTRAMUSCULAR | Status: DC
  Filled 2017-04-09: qty 2

## 2017-04-09 MED ORDER — BISACODYL 5 MG PO TBEC: 5.0000 mg | DELAYED_RELEASE_TABLET | Freq: Every day | ORAL | 0 refills | Status: DC | PRN

## 2017-04-09 MED ORDER — ONDANSETRON HCL 4 MG PO TABS: 4.0000 mg | ORAL_TABLET | Freq: Four times a day (QID) | ORAL | Status: DC | PRN

## 2017-04-09 MED ORDER — ASPIRIN 325 MG PO TABS: 325.0000 mg | ORAL_TABLET | Freq: Every day | ORAL | Status: DC

## 2017-04-09 MED ORDER — SORBITOL 70 % SOLN: 30.0000 mL | Freq: Every day | Status: DC | PRN

## 2017-04-09 MED ORDER — ENOXAPARIN SODIUM 40 MG/0.4ML ~~LOC~~ SOLN
40.0000 mg | SUBCUTANEOUS | Status: DC
  Administered 2017-04-09 – 2017-04-19 (×11): 40 mg via SUBCUTANEOUS
  Filled 2017-04-09 (×11): qty 0.4

## 2017-04-09 MED ORDER — BISACODYL 5 MG PO TBEC: 5.0000 mg | DELAYED_RELEASE_TABLET | Freq: Every day | ORAL | Status: DC | PRN

## 2017-04-09 MED ORDER — ONDANSETRON HCL 4 MG/2ML IJ SOLN
4.0000 mg | Freq: Four times a day (QID) | INTRAMUSCULAR | Status: DC | PRN
Start: 2017-04-09 — End: 2017-04-20

## 2017-04-09 MED ORDER — AMLODIPINE BESYLATE 2.5 MG PO TABS
2.5000 mg | ORAL_TABLET | Freq: Every day | ORAL | Status: DC
Start: 2017-04-10 — End: 2017-04-18

## 2017-04-09 MED ORDER — ALUM & MAG HYDROXIDE-SIMETH 200-200-20 MG/5ML PO SUSP: 15.0000 mL | ORAL | Status: DC | PRN

## 2017-04-09 MED ORDER — ATORVASTATIN CALCIUM 80 MG PO TABS
80.0000 mg | ORAL_TABLET | Freq: Every day | ORAL | Status: DC
  Administered 2017-04-09 – 2017-04-19 (×11): 80 mg via ORAL
  Filled 2017-04-09 (×11): qty 1

## 2017-04-09 MED ORDER — ENOXAPARIN SODIUM 40 MG/0.4ML ~~LOC~~ SOLN: 40.0000 mg | SUBCUTANEOUS | Status: DC

## 2017-04-09 MED ORDER — SODIUM CHLORIDE 0.9 % IV SOLN: INTRAVENOUS | Status: DC

## 2017-04-09 MED ORDER — ATORVASTATIN CALCIUM 80 MG PO TABS: 80.0000 mg | ORAL_TABLET | Freq: Every day | ORAL | Status: DC

## 2017-04-09 MED ORDER — PANTOPRAZOLE SODIUM 40 MG PO TBEC: 40.0000 mg | DELAYED_RELEASE_TABLET | Freq: Every day | ORAL | Status: DC

## 2017-04-09 MED ORDER — CLOPIDOGREL BISULFATE 75 MG PO TABS: 75.0000 mg | ORAL_TABLET | Freq: Every day | ORAL | Status: DC

## 2017-04-09 MED ORDER — AMLODIPINE BESYLATE 2.5 MG PO TABS
2.5000 mg | ORAL_TABLET | Freq: Every day | ORAL | Status: DC
  Administered 2017-04-10 – 2017-04-18 (×9): 2.5 mg via ORAL
  Filled 2017-04-09 (×9): qty 1

## 2017-04-09 MED ORDER — DOCUSATE SODIUM 100 MG PO CAPS
100.0000 mg | ORAL_CAPSULE | Freq: Every day | ORAL | Status: DC
  Administered 2017-04-09 – 2017-04-20 (×12): 100 mg via ORAL
  Filled 2017-04-09 (×12): qty 1

## 2017-04-09 MED ORDER — CLOPIDOGREL BISULFATE 75 MG PO TABS
75.0000 mg | ORAL_TABLET | Freq: Every day | ORAL | Status: DC
  Administered 2017-04-10 – 2017-04-20 (×11): 75 mg via ORAL
  Filled 2017-04-09 (×11): qty 1

## 2017-04-09 NOTE — Progress Notes (Signed)
[name] [name], [name] Salk, MD Physician Signed Physical Medicine and Rehabilitation  Consult Note Date of Service: 04/02/2017 9:13 AM  Related encounter: ED to Hosp-Admission (Current) from 04/01/2017 in [facility] 4E CV SURGICAL PROGRESSIVE CARE     Expand All Collapse All   [] Hide copied text [] Hover for attribution information      Physical Medicine and Rehabilitation Consult Reason for Consult: Right side weakness with gait disorder Referring Physician: [facility]   HPI: [name] [name] is a 71 y.o. right handed female on no prescription medications. Per chart review patient independent prior to admission living alone. She does drive. One level home with 4 steps to entry. Family in area question assistance on discharge. Presented 04/01/2017 with right-sided weakness and unsteady gait 2 days. CT/MRI showed patchy acute infarcts in the left greater than right cerebral hemispheres with distribution suggesting watershed ischemia. Advanced Holtman vessel ischemic changes. MRA with atherosclerotic type changes including severe right and moderate left ICA stenosis, severe bilateral vertebral artery stenosis. MRI cervical spine with mild cervical disc degeneration. MRI of the chest negative. Patient did not receive TPA. Echocardiogram pending. Neurology consulted presently on aspirin for CVA prophylaxis. Subcutaneous Lovenox for DVT prophylaxis. Tolerating a regular diet.  Daughter at bedside,  Review of Systems  Constitutional: Negative for chills and fever.  HENT: Negative for hearing loss.   Eyes: Negative for blurred vision and double vision.  Respiratory: Negative for cough and shortness of breath.   Cardiovascular: Negative for chest pain, palpitations and leg swelling.  Gastrointestinal: Positive for constipation. Negative for nausea and vomiting.  Genitourinary: Negative for dysuria, flank pain and hematuria.  Musculoskeletal: Positive for joint pain and myalgias.  Skin: Negative for rash.    Neurological: Positive for weakness. Negative for seizures.  All other systems reviewed and are negative.      Past Medical History:  Diagnosis Date  . Stroke [facility])    History reviewed. No pertinent surgical history.      Family History  Problem Relation Age of Onset  . Cancer Mother    Social History:  reports that she has never smoked. She has never used smokeless tobacco. She reports that she does not drink alcohol or use drugs. Allergies: No Known Allergies       Medications Prior to Admission  Medication Sig Dispense Refill  . Multiple Vitamins-Minerals (MULTIVITAMIN WITH MINERALS) tablet Take 1 tablet by mouth daily.    [name] (EYE DROPS ALLERGY RELIEF OP) Place 2 drops into both eyes 2 (two) times daily.      Home: Home Living Family/patient expects to be discharged to:: Private residence Living Arrangements: Alone Available Help at Discharge: Family, Available PRN/intermittently Type of Home: House Home Access: Stairs to enter Technical brewer of Steps: 4 Entrance Stairs-Rails: None Home Layout: One level Home Equipment: None  Functional History: Prior Function Level of Independence: Independent Comments: Drives Functional Status:  Mobility: Bed Mobility Overal bed mobility: Needs Assistance Bed Mobility: Supine to Sit, Sit to Supine Supine to sit: Mod assist, HOB elevated Sit to supine: Mod assist, HOB elevated General bed mobility comments: Verbal cues to move to sitting position.  Patient able to assist with moving RLE off of bed.  Required assist to manage RUE.  Assist to scoot hips to EOB in sitting.  Increased effort required for transition.  Patient able to maintain sitting balance with min guard assist.  However, patient with Rt shoulder/trunk was retracted, with Lt side more forward.  Patient unaware of sitting posture.  Verbal  and tactile cues to move to more symmetrical position.  Required assist to control  trunk and bring LE's onto bed to return to supine. Transfers Overall transfer level: Needs assistance Equipment used: 1 person hand held assist Transfers: Sit to/from Stand Sit to Stand: Min assist General transfer comment: Verbal cues for placement of LUE.  Patient able to move to standing with min assist to rise to standing and for balance.  Worked with patient on posture, bringing Rt side forward.  Support provided to RUE.  Patient able to step forward/back with LLE - noted Rt knee instability.  Then attempted to step forward/back with RLE.  Difficulty advancing RLE and moving forward over RLE for weight bearing.  RLE fatigued quickly. Ambulation/Gait General Gait Details: NT  ADL:  Cognition: Cognition Overall Cognitive Status: Impaired/Different from baseline Orientation Level: Oriented to person, Oriented to place, Oriented to time, Oriented to situation Cognition Arousal/Alertness: Awake/alert Behavior During Therapy: WFL for tasks assessed/performed Overall Cognitive Status: Impaired/Different from baseline Area of Impairment: Orientation, Following commands, Problem solving Orientation Level: Disoriented to, Place Following Commands: Follows one step commands with increased time Problem Solving: Difficulty sequencing, Requires verbal cues General Comments: Difficult at times to tell if patient doesn't know the answer or is having trouble saying the word.    Blood pressure (!) 180/70, pulse 67, temperature 97.9 F (36.6 C), temperature source Oral, resp. rate 18, height 5\' 8"  (1.727 m), weight 63.5 kg (140 lb), SpO2 99 %. Physical Exam  Vitals reviewed. Constitutional: She is oriented to person, place, and time.  HENT:  Head: Normocephalic.  Eyes: EOM are normal.  Neck: Normal range of motion. Neck supple. No tracheal deviation present. No thyromegaly present.  Cardiovascular: Normal rate, regular rhythm and normal heart sounds.   Respiratory: Effort normal and breath  sounds normal. No respiratory distress. She has no wheezes.  GI: Soft. Bowel sounds are normal. She exhibits no distension.  Neurological: She is alert and oriented to person, place, and time.  Flat affect.Follows commands   Skin: Skin is warm and dry.  Motor strength is 0/5 in the right deltoid, biceps, triceps, grip 4 minus at the right hip flexor, knee extensor, ankle dorsal flexor 5/5 left deltoid by successive grip as well as hip flexor, knee extensor, ankle dorsiflexor. Delayed responses to questions. Mood and affect are flat  Lab Results Last 24 Hours       Results for orders placed or performed during the hospital encounter of 04/01/17 (from the past 24 hour(s))  Ethanol     Status: None   Collection Time: 04/01/17  9:20 AM  Result Value Ref Range   Alcohol, Ethyl (B) <5 <5 mg/dL  Protime-INR     Status: None   Collection Time: 04/01/17  9:20 AM  Result Value Ref Range   Prothrombin Time 14.1 11.4 - 15.2 seconds   INR 1.08   APTT     Status: None   Collection Time: 04/01/17  9:20 AM  Result Value Ref Range   aPTT 33 24 - 36 seconds  CBC     Status: Abnormal   Collection Time: 04/01/17  9:20 AM  Result Value Ref Range   WBC 5.6 4.0 - 10.5 K/uL   RBC 5.23 (H) 3.87 - 5.11 MIL/uL   Hemoglobin 13.5 12.0 - 15.0 g/dL   HCT 42.2 36.0 - 46.0 %   MCV 80.7 78.0 - 100.0 fL   MCH 25.8 (L) 26.0 - 34.0 pg   MCHC 32.0 30.0 -  36.0 g/dL   RDW 14.0 11.5 - 15.5 %   Platelets 161 150 - 400 K/uL  Differential     Status: None   Collection Time: 04/01/17  9:20 AM  Result Value Ref Range   Neutrophils Relative % 79 %   Neutro Abs 4.4 1.7 - 7.7 K/uL   Lymphocytes Relative 14 %   Lymphs Abs 0.8 0.7 - 4.0 K/uL   Monocytes Relative 5 %   Monocytes Absolute 0.3 0.1 - 1.0 K/uL   Eosinophils Relative 1 %   Eosinophils Absolute 0.1 0.0 - 0.7 K/uL   Basophils Relative 1 %   Basophils Absolute 0.0 0.0 - 0.1 K/uL  Comprehensive metabolic panel     Status:  Abnormal   Collection Time: 04/01/17  9:20 AM  Result Value Ref Range   Sodium 140 135 - 145 mmol/L   Potassium 3.2 (L) 3.5 - 5.1 mmol/L   Chloride 106 101 - 111 mmol/L   CO2 24 22 - 32 mmol/L   Glucose, Bld 119 (H) 65 - 99 mg/dL   BUN 9 6 - 20 mg/dL   Creatinine, Ser 0.81 0.44 - 1.00 mg/dL   Calcium 9.1 8.9 - 10.3 mg/dL   Total Protein 6.7 6.5 - 8.1 g/dL   Albumin 3.7 3.5 - 5.0 g/dL   AST 17 15 - 41 U/L   ALT 12 (L) 14 - 54 U/L   Alkaline Phosphatase 68 38 - 126 U/L   Total Bilirubin 0.9 0.3 - 1.2 mg/dL   GFR calc non Af Amer >60 >60 mL/min   GFR calc Af Amer >60 >60 mL/min   Anion gap 10 5 - 15  Urine rapid drug screen (hosp performed)not at [facility]     Status: None   Collection Time: 04/01/17  9:20 AM  Result Value Ref Range   Opiates NONE DETECTED NONE DETECTED   Cocaine NONE DETECTED NONE DETECTED   Benzodiazepines NONE DETECTED NONE DETECTED   Amphetamines NONE DETECTED NONE DETECTED   Tetrahydrocannabinol NONE DETECTED NONE DETECTED   Barbiturates NONE DETECTED NONE DETECTED  Urinalysis, Routine w reflex microscopic     Status: Abnormal   Collection Time: 04/01/17  9:20 AM  Result Value Ref Range   Color, Urine AMBER (A) YELLOW   APPearance CLOUDY (A) CLEAR   Specific Gravity, Urine 1.021 1.005 - 1.030   pH 5.0 5.0 - 8.0   Glucose, UA NEGATIVE NEGATIVE mg/dL   Hgb urine dipstick MODERATE (A) NEGATIVE   Bilirubin Urine NEGATIVE NEGATIVE   Ketones, ur 80 (A) NEGATIVE mg/dL   Protein, ur NEGATIVE NEGATIVE mg/dL   Nitrite NEGATIVE NEGATIVE   Leukocytes, UA LARGE (A) NEGATIVE   RBC / HPF 0-5 0 - 5 RBC/hpf   WBC, UA TOO NUMEROUS TO COUNT 0 - 5 WBC/hpf   Bacteria, UA MANY (A) NONE SEEN   Squamous Epithelial / LPF 0-5 (A) NONE SEEN   Mucous PRESENT    Non Squamous Epithelial 0-5 (A) NONE SEEN  I-stat troponin, ED (not at [facility], [facility])     Status: None   Collection Time: 04/01/17  9:30 AM  Result Value Ref Range   Troponin i,  poc 0.00 0.00 - 0.08 ng/mL   Comment 3          I-Stat Chem 8, ED  (not at [facility], [facility])     Status: Abnormal   Collection Time: 04/01/17  9:32 AM  Result Value Ref Range   Sodium 141 135 - 145 mmol/L  Potassium 3.2 (L) 3.5 - 5.1 mmol/L   Chloride 104 101 - 111 mmol/L   BUN 9 6 - 20 mg/dL   Creatinine, Ser 0.80 0.44 - 1.00 mg/dL   Glucose, Bld 117 (H) 65 - 99 mg/dL   Calcium, Ion 1.09 (L) 1.15 - 1.40 mmol/L   TCO2 25 0 - 100 mmol/L   Hemoglobin 13.9 12.0 - 15.0 g/dL   HCT 41.0 36.0 - 46.0 %  Urine culture     Status: Abnormal   Collection Time: 04/01/17 12:16 PM  Result Value Ref Range   Specimen Description URINE, CATHETERIZED    Special Requests Normal    Culture MULTIPLE SPECIES PRESENT, SUGGEST RECOLLECTION (A)    Report Status 04/02/2017 FINAL   Troponin I     Status: None   Collection Time: 04/01/17  7:26 PM  Result Value Ref Range   Troponin I <0.03 <0.03 ng/mL  CBC     Status: Abnormal   Collection Time: 04/02/17  8:14 AM  Result Value Ref Range   WBC 3.6 (L) 4.0 - 10.5 K/uL   RBC 5.65 (H) 3.87 - 5.11 MIL/uL   Hemoglobin 14.8 12.0 - 15.0 g/dL   HCT 45.9 36.0 - 46.0 %   MCV 81.2 78.0 - 100.0 fL   MCH 26.2 26.0 - 34.0 pg   MCHC 32.2 30.0 - 36.0 g/dL   RDW 14.2 11.5 - 15.5 %   Platelets 172 150 - 400 K/uL      Imaging Results (Last 48 hours)  Ct Head Wo Contrast  Result Date: 04/01/2017 CLINICAL DATA:  Right arm paralysis for several days, initial encounter EXAM: CT HEAD WITHOUT CONTRAST TECHNIQUE: Contiguous axial images were obtained from the base of the skull through the vertex without intravenous contrast. COMPARISON:  None. FINDINGS: Brain: Chronic white matter ischemic change is seen. No findings to suggest acute hemorrhage, acute infarction or space-occupying mass lesion are noted. Prior lacunar infarcts are noted within the internal capsule on the right in the basal ganglia on the left. Vascular: No hyperdense vessel or  unexpected calcification. Skull: Normal. Negative for fracture or focal lesion. Sinuses/Orbits: No acute finding. Other: None. IMPRESSION: Chronic white matter ischemic change. Lacunar infarcts are noted. No acute abnormality is seen. Electronically Signed   By: Inez Catalina M.D.   On: 04/01/2017 10:53   Mr Brain Wo Contrast  Result Date: 04/01/2017 CLINICAL DATA:  Right arm weakness and unsteady gait for 2 days. EXAM: MRI HEAD WITHOUT CONTRAST MRA HEAD WITHOUT CONTRAST MRI CERVICAL SPINE WITHOUT CONTRAST TECHNIQUE: Multiplanar, multiecho pulse sequences of the brain and surrounding structures were obtained without intravenous contrast. Angiographic images of the head were obtained using MRA technique without contrast. Multiplanar, multiecho pulse sequences of the cervical spine were obtained without intravenous contrast. COMPARISON:  Head CT 04/01/2017 FINDINGS: MRI HEAD FINDINGS Brain: There are numerous Allard patchy acute infarcts predominantly sagittally oriented in the left cerebral hemisphere involving cortex and white matter with greatest involvement of the centrum semiovale suggestive of watershed ischemia. There are a few tiny acute infarcts in a similar distribution in the right cerebral hemisphere predominantly in the frontal and parietal lobes near the vertex. A 6 mm acute infarct is present in the genu of the corpus callosum. A chronic microhemorrhage is noted in the right thalamus. Patchy to confluent T2 hyperintensities in the cerebral white matter bilaterally are nonspecific but compatible with moderate chronic Farro vessel ischemia, advanced for age. Chronic lacunar infarcts are present in the cerebellum, bilateral  basal ganglia, right internal capsule, and corpus callosum and deep cerebral white matter bilaterally. There is mild cerebral and moderate cerebellar atrophy. No mass, midline shift, or extra-axial fluid collection is seen. Mild to moderate chronic Banfill vessel ischemic changes are  present in the pons with a tiny chronic infarct noted. The pituitary gland is mildly prominent in size for age, measuring 8 mm in height. Vascular: Major intracranial vascular flow voids are preserved. Skull and upper cervical spine: Unremarkable bone marrow signal. Sinuses/Orbits: Unremarkable orbits. Paranasal sinuses and mastoid air cells are clear. Other: None. MRA HEAD FINDINGS The visualized distal vertebral arteries are patent to the basilar with the left being slightly larger than the right. There is diffuse V4 segment atherosclerotic irregularity with severe stenoses bilaterally, greater on the left. AICAs appear patent proximally with a severe proximal stenosis on the right. SCA origins are patent bilaterally. The basilar artery is patent with mild irregularity but no significant stenosis. Posterior communicating arteries are not identified. PCAs are patent with a severe proximal P2 stenosis on the right and moderate to prominent branch vessel irregularity and narrowing bilaterally. The internal carotid arteries are patent from skullbase to carotid termini with advanced siphon atherosclerosis resulting in severe tandem cavernous stenoses on the right and moderate stenosis on the left. 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 The left A1 segment is patent with mild irregularity proximally but no significant stenosis and supplies the right ACA via the anterior communicating artery. There are severe A2 stenoses bilaterally. The MCAs are patent without evidence of significant M1 stenosis or proximal branch occlusion. There is moderate MCA branch vessel atherosclerotic irregularity and narrowing bilaterally. No aneurysm is identified. MRI CERVICAL SPINE FINDINGS The study is mildly motion degraded. Alignment: Normal. Vertebrae: No evidence of fracture, osseous lesion, or significant marrow edema. Cord: Normal signal and morphology. Posterior Fossa, vertebral arteries, paraspinal  tissues: Cerebral atrophy and infarcts as described above. Preserved vertebral artery flow voids in the neck. Disc levels: C2-3:  Negative. C3-4: Disc bulging and uncovertebral spurring result in mild right neural foraminal stenosis without spinal stenosis. C4-5: Minimal disc bulging and uncovertebral spurring without significant stenosis. C5-6:  Minimal disc bulging without stenosis. C6-7: Agostinelli central disc extrusion with slight caudal migration without stenosis or significant spinal cord mass effect. C7-T1: Potts right paracentral disc protrusion without significant stenosis. IMPRESSION: 1. Patchy acute infarcts in the left greater than right cerebral hemispheres with distribution suggesting watershed ischemia. 2. Age advanced chronic Zeis vessel ischemic disease with numerous chronic infarcts as above. 3. Mild prominence of the pituitary gland for age. Consider laboratory correlation. 4. Advanced intracranial atherosclerosis as above including severe right and moderate left ICA stenoses, severe bilateral vertebral artery stenoses, and severe right P2 PCA stenosis. 5. Mild cervical disc degeneration without high-grade stenosis as above. Electronically Signed   By: Logan Bores M.D.   On: 04/01/2017 17:15   Mr Chest W Wo Contrast  Result Date: 04/02/2017 CLINICAL DATA:  Right arm weakness and unsteady gait for 2 days. EXAM: MRI BRACHIAL PLEXUS WITHOUT CONTRAST TECHNIQUE: Multiplanar, multiecho pulse sequences of the neck and surrounding structures were obtained without intravenous contrast. The field of view was focused on the right brachial plexus from the neural foramina to the axilla. CONTRAST:  51mL MULTIHANCE GADOBENATE DIMEGLUMINE 529 MG/ML IV SOLN COMPARISON:  None. FINDINGS: Despite efforts by the technologist and patient, motion artifact is present on today's exam and could not be eliminated. This reduces exam sensitivity and specificity. Spinal  cord No specific abnormality were visualized; todays  exam was not a dedicated study of the cervical or thoracic spine. Brachial plexus: No appreciable abnormal edema, mass effect, or other specific abnormality along the components of the right brachial plexus. 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 Other findings No supplemental non-categorized findings. IMPRESSION: 1. No significant abnormality of the brachial plexus is identified. 2. Cervical and thoracic spondylosis. 3. 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 Electronically Signed   By: Van Clines M.D.   On: 04/02/2017 07:10   Mr Cervical Spine Wo Contrast  Result Date: 04/01/2017 CLINICAL DATA:  Right arm weakness and unsteady gait for 2 days. EXAM: MRI HEAD WITHOUT CONTRAST MRA HEAD WITHOUT CONTRAST MRI CERVICAL SPINE WITHOUT CONTRAST TECHNIQUE: Multiplanar, multiecho pulse sequences of the brain and surrounding structures were obtained without intravenous contrast. Angiographic images of the head were obtained using MRA technique without contrast. Multiplanar, multiecho pulse sequences of the cervical spine were obtained without intravenous contrast. COMPARISON:  Head CT 04/01/2017 FINDINGS: MRI HEAD FINDINGS Brain: There are numerous Delrossi patchy acute infarcts predominantly sagittally oriented in the left cerebral hemisphere involving cortex and white matter with greatest involvement of the centrum semiovale suggestive of watershed ischemia. There are a few tiny acute infarcts in a similar distribution in the right cerebral hemisphere predominantly in the frontal and parietal lobes near the vertex. A 6 mm acute infarct is present in the genu of the corpus callosum. A chronic microhemorrhage is noted in the right thalamus. Patchy to confluent T2 hyperintensities in the cerebral white matter bilaterally are nonspecific but compatible with moderate chronic Stricker vessel  ischemia, advanced for age. Chronic lacunar infarcts are present in the cerebellum, bilateral basal ganglia, right internal capsule, and corpus callosum and deep cerebral white matter bilaterally. There is mild cerebral and moderate cerebellar atrophy. No mass, midline shift, or extra-axial fluid collection is seen. Mild to moderate chronic Pair vessel ischemic changes are present in the pons with a tiny chronic infarct noted. The pituitary gland is mildly prominent in size for age, measuring 8 mm in height. Vascular: Major intracranial vascular flow voids are preserved. Skull and upper cervical spine: Unremarkable bone marrow signal. Sinuses/Orbits: Unremarkable orbits. Paranasal sinuses and mastoid air cells are clear. Other: None. MRA HEAD FINDINGS The visualized distal vertebral arteries are patent to the basilar with the left being slightly larger than the right. There is diffuse V4 segment atherosclerotic irregularity with severe stenoses bilaterally, greater on the left. AICAs appear patent proximally with a severe proximal stenosis on the right. SCA origins are patent bilaterally. The basilar artery is patent with mild irregularity but no significant stenosis. Posterior communicating arteries are not identified. PCAs are patent with a severe proximal P2 stenosis on the right and moderate to prominent branch vessel irregularity and narrowing bilaterally. The internal carotid arteries are patent from skullbase to carotid termini with advanced siphon atherosclerosis resulting in severe tandem cavernous stenoses on the right and moderate stenosis on the left. 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 The left A1 segment is patent with mild irregularity proximally but no significant stenosis and supplies the right ACA via the anterior communicating artery. There are severe A2 stenoses bilaterally. The MCAs are patent without evidence of significant M1 stenosis or proximal branch  occlusion. There is moderate MCA branch vessel atherosclerotic irregularity and narrowing bilaterally. No aneurysm is identified. MRI CERVICAL SPINE  FINDINGS The study is mildly motion degraded. Alignment: Normal. Vertebrae: No evidence of fracture, osseous lesion, or significant marrow edema. Cord: Normal signal and morphology. Posterior Fossa, vertebral arteries, paraspinal tissues: Cerebral atrophy and infarcts as described above. Preserved vertebral artery flow voids in the neck. Disc levels: C2-3:  Negative. C3-4: Disc bulging and uncovertebral spurring result in mild right neural foraminal stenosis without spinal stenosis. C4-5: Minimal disc bulging and uncovertebral spurring without significant stenosis. C5-6:  Minimal disc bulging without stenosis. C6-7: Conteh central disc extrusion with slight caudal migration without stenosis or significant spinal cord mass effect. C7-T1: Carrington right paracentral disc protrusion without significant stenosis. IMPRESSION: 1. Patchy acute infarcts in the left greater than right cerebral hemispheres with distribution suggesting watershed ischemia. 2. Age advanced chronic Crass vessel ischemic disease with numerous chronic infarcts as above. 3. Mild prominence of the pituitary gland for age. Consider laboratory correlation. 4. Advanced intracranial atherosclerosis as above including severe right and moderate left ICA stenoses, severe bilateral vertebral artery stenoses, and severe right P2 PCA stenosis. 5. Mild cervical disc degeneration without high-grade stenosis as above. Electronically Signed   By: Logan Bores M.D.   On: 04/01/2017 17:15   Mr [name] Head/brain FA Cm  Result Date: 04/01/2017 CLINICAL DATA:  Right arm weakness and unsteady gait for 2 days. EXAM: MRI HEAD WITHOUT CONTRAST MRA HEAD WITHOUT CONTRAST MRI CERVICAL SPINE WITHOUT CONTRAST TECHNIQUE: Multiplanar, multiecho pulse sequences of the brain and surrounding structures were obtained without intravenous  contrast. Angiographic images of the head were obtained using MRA technique without contrast. Multiplanar, multiecho pulse sequences of the cervical spine were obtained without intravenous contrast. COMPARISON:  Head CT 04/01/2017 FINDINGS: MRI HEAD FINDINGS Brain: There are numerous Hust patchy acute infarcts predominantly sagittally oriented in the left cerebral hemisphere involving cortex and white matter with greatest involvement of the centrum semiovale suggestive of watershed ischemia. There are a few tiny acute infarcts in a similar distribution in the right cerebral hemisphere predominantly in the frontal and parietal lobes near the vertex. A 6 mm acute infarct is present in the genu of the corpus callosum. A chronic microhemorrhage is noted in the right thalamus. Patchy to confluent T2 hyperintensities in the cerebral white matter bilaterally are nonspecific but compatible with moderate chronic Patteson vessel ischemia, advanced for age. Chronic lacunar infarcts are present in the cerebellum, bilateral basal ganglia, right internal capsule, and corpus callosum and deep cerebral white matter bilaterally. There is mild cerebral and moderate cerebellar atrophy. No mass, midline shift, or extra-axial fluid collection is seen. Mild to moderate chronic [place] vessel ischemic changes are present in the pons with a tiny chronic infarct noted. The pituitary gland is mildly prominent in size for age, measuring 8 mm in height. Vascular: Major intracranial vascular flow voids are preserved. Skull and upper cervical spine: Unremarkable bone marrow signal. Sinuses/Orbits: Unremarkable orbits. Paranasal sinuses and mastoid air cells are clear. Other: None. MRA HEAD FINDINGS The visualized distal vertebral arteries are patent to the basilar with the left being slightly larger than the right. There is diffuse V4 segment atherosclerotic irregularity with severe stenoses bilaterally, greater on the left. AICAs appear patent  proximally with a severe proximal stenosis on the right. SCA origins are patent bilaterally. The basilar artery is patent with mild irregularity but no significant stenosis. Posterior communicating arteries are not identified. PCAs are patent with a severe proximal P2 stenosis on the right and moderate to prominent branch vessel irregularity and narrowing bilaterally. The internal carotid arteries  are patent from skullbase to carotid termini with advanced siphon atherosclerosis resulting in severe tandem cavernous stenoses on the right and moderate stenosis on the left. 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 The left A1 segment is patent with mild irregularity proximally but no significant stenosis and supplies the right ACA via the anterior communicating artery. There are severe A2 stenoses bilaterally. The MCAs are patent without evidence of significant M1 stenosis or proximal branch occlusion. There is moderate MCA branch vessel atherosclerotic irregularity and narrowing bilaterally. No aneurysm is identified. MRI CERVICAL SPINE FINDINGS The study is mildly motion degraded. Alignment: Normal. Vertebrae: No evidence of fracture, osseous lesion, or significant marrow edema. Cord: Normal signal and morphology. Posterior Fossa, vertebral arteries, paraspinal tissues: Cerebral atrophy and infarcts as described above. Preserved vertebral artery flow voids in the neck. Disc levels: C2-3:  Negative. C3-4: Disc bulging and uncovertebral spurring result in mild right neural foraminal stenosis without spinal stenosis. C4-5: Minimal disc bulging and uncovertebral spurring without significant stenosis. C5-6:  Minimal disc bulging without stenosis. C6-7: Mayeda central disc extrusion with slight caudal migration without stenosis or significant spinal cord mass effect. C7-T1: Adee right paracentral disc protrusion without significant stenosis. IMPRESSION: 1. Patchy acute infarcts in the left greater  than right cerebral hemispheres with distribution suggesting watershed ischemia. 2. Age advanced chronic [place] vessel ischemic disease with numerous chronic infarcts as above. 3. Mild prominence of the pituitary gland for age. Consider laboratory correlation. 4. Advanced intracranial atherosclerosis as above including severe right and moderate left ICA stenoses, severe bilateral vertebral artery stenoses, and severe right P2 PCA stenosis. 5. Mild cervical disc degeneration without high-grade stenosis as above. Electronically Signed   By: Logan Bores M.D.   On: 04/01/2017 17:15     Assessment/Plan: Diagnosis: Watershed infarct, left parietal, with right hemiparesis and cognitive deficits 1. Does the need for close, 24 hr/day medical supervision in concert with the patient's rehab needs make it unreasonable for this patient to be served in a less intensive setting? Yes 2. Co-Morbidities requiring supervision/potential complications: Hypertension, hypokalemia, UTI 3. Due to bladder management, bowel management, safety, skin/wound care, disease management, medication administration, pain management and patient education, does the patient require 24 hr/day rehab nursing? Yes 4. Does the patient require coordinated care of a physician, rehab nurse, PT (1-2 hrs/day, 5 days/week), OT (1-2 hrs/day, 5 days/week) and SLP (.5-1 hrs/day, 5 days/week) to address physical and functional deficits in the context of the above medical diagnosis(es)? Yes Addressing deficits in the following areas: balance, endurance, locomotion, strength, transferring, bowel/bladder control, bathing, dressing, feeding, grooming, toileting, cognition and psychosocial support 5. Can the patient actively participate in an intensive therapy program of at least 3 hrs of therapy per day at least 5 days per week? Yes 6. The potential for patient to make measurable gains while on inpatient rehab is good 7. Anticipated functional outcomes upon  discharge from inpatient rehab are supervision  with PT, supervision with OT, supervision with SLP. 8. Estimated rehab length of stay to reach the above functional goals is: 10-14d 9. Anticipated D/C setting: Home 10. Anticipated post D/C treatments: [facility] therapy 11. Overall Rehab/Functional Prognosis: good  RECOMMENDATIONS: This patient's condition is appropriate for continued rehabilitative care in the following setting: CIR Patient has agreed to participate in recommended program. Yes Note that insurance prior authorization may be required for reimbursement for recommended care.  Comment:   Charlett Blake M.D. [employer] FAAPM&R (Sports Med, Neuromuscular Med)  Diplomate Am Board of Electrodiagnostic Med  Cathlyn Parsons., PA-C 04/02/2017    Revision History                        Routing History

## 2017-04-09 NOTE — Progress Notes (Addendum)
[facility]  Subjective  - Doing well.  Has gross movement in right UE.   Objective 111/61 73 99.4 F (37.4 C) (Oral) 13 97%  Intake/Output Summary (Last 24 hours) at 04/09/17 0721 Last data filed at 04/09/17 0500  Gross per 24 hour  Intake             2615 ml  Output              850 ml  Net             1765 ml    Left neck incision healing well without hematoma. No tongue deviation, smile is symmetric   Assessment/Planning: POD # 1 left CEA  Patient stable for CIR BP well controlled over night 517-616 systolic. Continue Plavix and aspirin. Plan for f/u in 2-3 weeks in our office with Dr. Marquis Buggy, [facility] 04/09/2017 7:21 AM --  Laboratory Lab Results:  Recent Labs  04/08/17 1541 04/09/17 0510  WBC 7.7 4.7  HGB 12.2 11.6*  HCT 40.5 37.7  PLT 161 167   BMET  Recent Labs  04/08/17 0459 04/08/17 1541 04/09/17 0510  NA 139  --  140  K 3.8  --  3.9  CL 105  --  107  CO2 26  --  26  GLUCOSE 102*  --  94  BUN 16  --  9  CREATININE 0.75 0.78 0.79  CALCIUM 8.9  --  8.7*    COAG Lab Results  Component Value Date   INR 1.00 04/08/2017   INR 1.08 04/01/2017   No results found for: PTT  I have independently interviewed patient and agree with PA assessment and plan above. She is neurologically at her preop state. Ok for [employer]   Brandon C. Donzetta Matters, MD [facility] Office: [phone] Pager: [phone]

## 2017-04-09 NOTE — Discharge Instructions (Signed)
Follow with Primary MD in 7 days   Get CBC, CMP, 2 view Chest X ray checked  by Primary MD or SNF MD in 5-7 days ( we routinely change or add medications that can affect your baseline labs and fluid status, therefore we recommend that you get the mentioned basic workup next visit with your PCP, your PCP may decide not to get them or add new tests based on their clinical decision)  Activity: As tolerated with Full fall precautions use walker/cane & assistance as needed  Disposition CIR  Diet:  Heart Healthy    For Heart failure patients - Check your Weight same time everyday, if you gain over 2 pounds, or you develop in leg swelling, experience more shortness of breath or chest pain, call your Primary MD immediately. Follow Cardiac Low Salt Diet and 1.5 lit/day fluid restriction.  On your next visit with your primary care physician please Get Medicines reviewed and adjusted.  Please request your Prim.MD to go over all Hospital Tests and Procedure/Radiological results at the follow up, please get all Hospital records sent to your Prim MD by signing hospital release before you go home.  If you experience worsening of your admission symptoms, develop shortness of breath, life threatening emergency, suicidal or homicidal thoughts you must seek medical attention immediately by calling 911 or calling your MD immediately  if symptoms less severe.  You Must read complete instructions/literature along with all the possible adverse reactions/side effects for all the Medicines you take and that have been prescribed to you. Take any new Medicines after you have completely understood and accpet all the possible adverse reactions/side effects.   Do not drive, operate heavy machinery, perform activities at heights, swimming or participation in water activities or provide baby sitting services if your were admitted for syncope or siezures until you have seen by Primary MD or a Neurologist and advised to do so  again.  Do not drive when taking Pain medications.    Do not take more than prescribed Pain, Sleep and Anxiety Medications  Special Instructions: If you have smoked or chewed Tobacco  in the last 2 yrs please stop smoking, stop any regular Alcohol  and or any Recreational drug use.  Wear Seat belts while driving.   Please note  You were cared for by a hospitalist during your hospital stay. If you have any questions about your discharge medications or the care you received while you were in the hospital after you are discharged, you can call the unit and asked to speak with the hospitalist on call if the hospitalist that took care of you is not available. Once you are discharged, your primary care physician will handle any further medical issues. Please note that NO REFILLS for any discharge medications will be authorized once you are discharged, as it is imperative that you return to your primary care physician (or establish a relationship with a primary care physician if you do not have one) for your aftercare needs so that they can reassess your need for medications and monitor your lab values.

## 2017-04-09 NOTE — H&P (Signed)
Physical Medicine and Rehabilitation Admission H&P    Chief Complaint  Patient presents with  . Extremity Weakness    paralysis   : HPI: [name] [name] is a 71 y.o. right handed female on no prescription medications. Per chart review and daughter, patient independent prior to admission living alone. She does drive. One level home with 4 steps to entry. Family in area question assistance on discharge. Presented 04/01/2017 with right-sided weakness and unsteady gait 2 days. CT/MRI reviewed, showing bilateral [place] infarcts.  Per report, patchy acute infarcts in the left greater than right cerebral hemispheres with distribution suggesting watershed ischemia. Advanced Tates vessel ischemic changes. MRA with atherosclerotic type changes including severe right and moderate left ICA stenosis, severe bilateral vertebral artery stenosis.Carotid Doppler showed 1-39% right ICA stenosis and greater than 80% left ICA stenosis. MRI cervical spine with mild cervical disc degeneration. MRI of the chest negative. Patient did not receive TPA. Echocardiogram with ejection fraction of 76% grade 1 diastolic dysfunction. There was plan for TEE and possible loop recorder placement however patient declined procedure at this time and plan to follow-up outpatient to discuss further options. Neurology consulted presently on aspirin and Plavix for CVA prophylaxis. Subcutaneous Lovenox for DVT prophylaxis. Follow-up vascular surgery for carotid stenosis and underwent left carotid endarterectomy 04/08/2017 per Dr. Servando Snare. Tolerating a regular diet. Physical and occupational therapy evaluations completed with recommendations of physical medicine rehabilitation consult. Patient was admitted for comprehensive rehabilitation program.  Review of Systems  Constitutional: Negative for chills and fever.  Eyes: Negative for blurred vision and double vision.  Respiratory: Negative for cough and shortness of breath.     Cardiovascular: Negative for chest pain, palpitations and leg swelling.  Gastrointestinal: Positive for constipation. Negative for nausea and vomiting.  Genitourinary: Negative for dysuria, flank pain and hematuria.  Musculoskeletal: Positive for joint pain. Negative for falls.  Skin: Negative for rash.  Neurological: Positive for focal weakness and weakness. Negative for seizures.  All other systems reviewed and are negative.  Past Medical History:  Diagnosis Date  . Stroke [facility])    History reviewed. No pertinent surgical history. Family History  Problem Relation Age of Onset  . Cancer Mother    Social History:  reports that she has never smoked. She has never used smokeless tobacco. She reports that she does not drink alcohol or use drugs. Allergies: No Known Allergies Medications Prior to Admission  Medication Sig Dispense Refill  . Multiple Vitamins-Minerals (MULTIVITAMIN WITH MINERALS) tablet Take 1 tablet by mouth daily.    [name] (EYE DROPS ALLERGY RELIEF OP) Place 2 drops into both eyes 2 (two) times daily.      Home: Home Living Family/patient expects to be discharged to:: Private residence Living Arrangements: Alone Available Help at Discharge: Family, Available PRN/intermittently Type of Home: House Home Access: Stairs to enter Technical brewer of Steps: 4 Entrance Stairs-Rails: None Home Layout: One level Bathroom Shower/Tub: Chiropodist: Standard Home Equipment: None  Lives With: Alone   Functional History: Prior Function Level of Independence: Independent Comments: Drives, works for [employer]:  Mobility: Bed Mobility Overal bed mobility: Needs Assistance Bed Mobility: Supine to Sit Supine to sit: Min assist, HOB elevated Sit to supine: Min assist General bed mobility comments: Facilitation for side lying to supine Transfers Overall transfer level: Needs  assistance Equipment used: 1 person hand held assist Transfers: Sit to/from Stand Sit to Stand: Min assist Stand pivot transfers: [place]  transfer comment: Min assist for balance support and safety. Placed R hand on arm rest of the chair to stand and pt was able to push up and put some weight through it. Ambulation/Gait Ambulation/Gait assistance: Min assist, +2 safety/equipment Ambulation Distance (Feet): 100 Feet Assistive device: 1 person hand held assist Gait Pattern/deviations: Step-through pattern, Decreased stride length, Shuffle, Decreased dorsiflexion - right, Decreased step length - right, Trunk flexed General Gait Details: VC's for sequencing, improved posture, and increased step/stride length with more aggressive heel strike. Pt was unable to make corrective changes. Was able to participate in higher level balance activity including side stepping to the R and L, backwards walking, and stepping over an object. Increased assistance reqired for backwards walking and stepping over an object.  Gait velocity: Decreased Gait velocity interpretation: Below normal speed for age/gender    ADL: ADL Overall ADL's : Needs assistance/impaired Eating/Feeding: Set up, Bed level Grooming: Moderate assistance, Sitting Grooming Details (indicate cue type and reason): in recliner Upper Body Bathing: Moderate assistance, Sitting Upper Body Bathing Details (indicate cue type and reason): using wash cloth, HOH with RUE Lower Body Bathing: Moderate assistance, Sit to/from stand Upper Body Dressing : Minimal assistance, Sitting Lower Body Dressing: Maximal assistance, Sit to/from stand Toilet Transfer: Minimal assistance, Ambulation, Comfort height toilet (1 HHA) Toilet Transfer Details (indicate cue type and reason): when RUE is positioned prior to transfer, Pt will bear weight through it Toileting- Clothing Manipulation and Hygiene: Sit to/from stand, Moderate assistance Functional  mobility during ADLs: Minimal assistance General ADL Comments: Focus of session on facilitation of RUE functional movemetn patterns using PNF and NDT techniques. Movement improved with proprioceptive input and repetiion of movement patterns.   Cognition: Cognition Overall Cognitive Status: Impaired/Different from baseline Arousal/Alertness: Awake/alert Orientation Level: Oriented X4 Attention: Sustained Sustained Attention: Impaired Sustained Attention Impairment: Verbal basic, Functional basic Memory: Impaired Memory Impairment: Storage deficit, Decreased recall of new information, Decreased short term memory Decreased Short Term Memory: Verbal basic, Functional basic Awareness: Impaired Awareness Impairment: Intellectual impairment Problem Solving: Impaired Problem Solving Impairment: Verbal basic, Functional basic Executive Function: Reasoning, Sequencing, Initiating Reasoning: Impaired Reasoning Impairment: Verbal basic, Functional basic Sequencing: Impaired Sequencing Impairment: Verbal basic, Functional basic Initiating: Impaired Initiating Impairment: Verbal basic, Functional basic Safety/Judgment: Impaired Comments: reduced awareness/insight Cognition Arousal/Alertness: Awake/alert Behavior During Therapy: Flat affect Overall Cognitive Status: Impaired/Different from baseline Area of Impairment: Attention, Following commands, Safety/judgement, Awareness, Problem solving Orientation Level: Disoriented to, Place Current Attention Level: Selective Following Commands: Follows one step commands inconsistently Awareness: Emergent Problem Solving: Difficulty sequencing General Comments: When performing seated ADL, Pt struggled with problem solving how to do tasks that were typically 2 handed tasks with one hand  Physical Exam: Blood pressure 111/61, pulse 73, temperature 99.4 F (37.4 C), temperature source Oral, resp. rate 13, height '5\' 8"'$  (1.727 m), weight 63.5 kg (140  lb), SpO2 97 %. Physical Exam  Constitutional: She is oriented to person, place, and time. She appears well-developed and well-nourished.  HENT:  Head: Normocephalic.  +Incision  Eyes: EOM are normal. Right eye exhibits no discharge. Left eye exhibits no discharge.  Neck: Normal range of motion. Neck supple. No thyromegaly present.  Cardiovascular: Normal rate, regular rhythm and normal heart sounds.   Respiratory: Effort normal and breath sounds normal. No respiratory distress.  GI: Soft. Bowel sounds are normal. She exhibits no distension.  Musculoskeletal: She exhibits edema and tenderness.  Neurological: She is alert and oriented to person, place, and time.  Motor: Right shoulder  abduction 2/5, elbow flex/ext 1+/5, distally 0/5 RLE: 4/5 proximal to distal LUE/LLE: 5/5 proximal to distal DTRs symmetric  Skin:  Neck incision c/d/i  Psychiatric: She has a normal mood and affect. Her behavior is normal.    Results for orders placed or performed during the hospital encounter of 04/01/17 (from the past 48 hour(s))  Protime-INR     Status: None   Collection Time: 04/08/17  4:59 AM  Result Value Ref Range   Prothrombin Time 13.2 11.4 - 15.2 seconds   INR 4.94   Basic metabolic panel     Status: Abnormal   Collection Time: 04/08/17  4:59 AM  Result Value Ref Range   Sodium 139 135 - 145 mmol/L   Potassium 3.8 3.5 - 5.1 mmol/L   Chloride 105 101 - 111 mmol/L   CO2 26 22 - 32 mmol/L   Glucose, Bld 102 (H) 65 - 99 mg/dL   BUN 16 6 - 20 mg/dL   Creatinine, Ser 0.75 0.44 - 1.00 mg/dL   Calcium 8.9 8.9 - 10.3 mg/dL   GFR calc non Af Amer >60 >60 mL/min   GFR calc Af Amer >60 >60 mL/min    Comment: (NOTE) The eGFR has been calculated using the CKD EPI equation. This calculation has not been validated in all clinical situations. eGFR's persistently <60 mL/min signify possible Chronic Kidney Disease.    Anion gap 8 5 - 15  CBC     Status: Abnormal   Collection Time: 04/08/17  4:59  AM  Result Value Ref Range   WBC 4.1 4.0 - 10.5 K/uL   RBC 4.99 3.87 - 5.11 MIL/uL   Hemoglobin 12.7 12.0 - 15.0 g/dL   HCT 40.5 36.0 - 46.0 %   MCV 81.2 78.0 - 100.0 fL   MCH 25.5 (L) 26.0 - 34.0 pg   MCHC 31.4 30.0 - 36.0 g/dL   RDW 14.1 11.5 - 15.5 %   Platelets 176 150 - 400 K/uL  POCT Activated clotting time     Status: None   Collection Time: 04/08/17  8:24 AM  Result Value Ref Range   Activated Clotting Time 252 seconds  Glucose, capillary     Status: Abnormal   Collection Time: 04/08/17 10:08 AM  Result Value Ref Range   Glucose-Capillary 102 (H) 65 - 99 mg/dL   Comment 1 Notify RN    Comment 2 Document in Chart   MRSA PCR Screening     Status: None   Collection Time: 04/08/17  3:14 PM  Result Value Ref Range   MRSA by PCR NEGATIVE NEGATIVE    Comment:        The GeneXpert MRSA Assay (FDA approved for NASAL specimens only), is one component of a comprehensive MRSA colonization surveillance program. It is not intended to diagnose MRSA infection nor to guide or monitor treatment for MRSA infections.   CBC     Status: Abnormal   Collection Time: 04/08/17  3:41 PM  Result Value Ref Range   WBC 7.7 4.0 - 10.5 K/uL   RBC 4.93 3.87 - 5.11 MIL/uL   Hemoglobin 12.2 12.0 - 15.0 g/dL   HCT 40.5 36.0 - 46.0 %   MCV 82.2 78.0 - 100.0 fL   MCH 24.7 (L) 26.0 - 34.0 pg   MCHC 30.1 30.0 - 36.0 g/dL   RDW 14.0 11.5 - 15.5 %   Platelets 161 150 - 400 K/uL  Creatinine, serum     Status: None  Collection Time: 04/08/17  3:41 PM  Result Value Ref Range   Creatinine, Ser 0.78 0.44 - 1.00 mg/dL   GFR calc non Af Amer >60 >60 mL/min   GFR calc Af Amer >60 >60 mL/min    Comment: (NOTE) The eGFR has been calculated using the CKD EPI equation. This calculation has not been validated in all clinical situations. eGFR's persistently <60 mL/min signify possible Chronic Kidney Disease.   CBC     Status: Abnormal   Collection Time: 04/09/17  5:10 AM  Result Value Ref Range   WBC  4.7 4.0 - 10.5 K/uL   RBC 4.57 3.87 - 5.11 MIL/uL   Hemoglobin 11.6 (L) 12.0 - 15.0 g/dL   HCT 61.5 43.2 - 08.0 %   MCV 82.5 78.0 - 100.0 fL   MCH 25.4 (L) 26.0 - 34.0 pg   MCHC 30.8 30.0 - 36.0 g/dL   RDW 70.5 63.2 - 11.8 %   Platelets 167 150 - 400 K/uL  Basic metabolic panel     Status: Abnormal   Collection Time: 04/09/17  5:10 AM  Result Value Ref Range   Sodium 140 135 - 145 mmol/L   Potassium 3.9 3.5 - 5.1 mmol/L   Chloride 107 101 - 111 mmol/L   CO2 26 22 - 32 mmol/L   Glucose, Bld 94 65 - 99 mg/dL   BUN 9 6 - 20 mg/dL   Creatinine, Ser 4.91 0.44 - 1.00 mg/dL   Calcium 8.7 (L) 8.9 - 10.3 mg/dL   GFR calc non Af Amer >60 >60 mL/min   GFR calc Af Amer >60 >60 mL/min    Comment: (NOTE) The eGFR has been calculated using the CKD EPI equation. This calculation has not been validated in all clinical situations. eGFR's persistently <60 mL/min signify possible Chronic Kidney Disease.    Anion gap 7 5 - 15   No results found.     Medical Problem List and Plan: 1.  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 2.  DVT Prophylaxis/Anticoagulation: Subcutaneous Lovenox. Monitor for any bleeding episodes. Venous Doppler studies negative 3. Pain Management: Oxycodone as needed 4. Mood: Provide emotional support 5. Neuropsych: This patient is not fully capable of making decisions on her own behalf. 6. Skin/Wound Care: Routine skin checks 7. Fluids/Electrolytes/Nutrition: Routine I&O with follow-up chemistries 8. Left carotid stenosis. Status post left CEA 04/08/2017 9. Hypertension. Norvasc 2.5 mg daily. Monitor with increased mobility 10. Hyperlipidemia. Lipitor  Post Admission Physician Evaluation: 1. Preadmission assessment reviewed and changes made below. 2. 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 3. Patient is admitted to receive collaborative, interdisciplinary care between the physiatrist, rehab nursing staff, and  therapy team. 4. Patient's level of medical complexity and substantial therapy needs in context of that medical necessity cannot be provided at a lesser intensity of care such as a SNF. 5. Patient has experienced substantial functional loss from his/her baseline which was documented above under the "Functional History" and "Functional Status" headings.  Judging by the patient's diagnosis, physical exam, and functional history, the patient has potential for functional progress which will result in measurable gains while on inpatient rehab.  These gains will be of substantial and practical use upon discharge  in facilitating mobility and self-care at the household level. 6. Physiatrist will provide 24 hour management of medical needs as well as oversight of the therapy plan/treatment and provide guidance as appropriate regarding the interaction of the two. 7. 24 hour rehab nursing  will assist with safety, skin/wound care, disease management, pain management and patient education  and help integrate therapy concepts, techniques,education, etc. 8. PT will assess and treat for/with: Lower extremity strength, range of motion, stamina, balance, functional mobility, safety, adaptive techniques and equipment, woundcare, coping skills, pain control, education.   Goals are: Mod I. 9. OT will assess and treat for/with: ADL's, functional mobility, safety, upper extremity strength, adaptive techniques and equipment, wound mgt, ego support, and community reintegration.   Goals are: Mod I. Therapy may proceed with showering this patient. 10. SLP will assess and treat for/with: cognition.  Goals are: supervision. 11. Case Management and Social Worker will assess and treat for psychological issues and discharge planning. 12. Team conference will be held weekly to assess progress toward goals and to determine barriers to discharge. 13. Patient will receive at least 3 hours of therapy per day at least 5 days per  week. 14. ELOS: 12-16 days.       15. Prognosis:  good  Delice Lesch, MD, Mellody Drown Cathlyn Parsons., PA-C 04/09/2017

## 2017-04-09 NOTE — Progress Notes (Signed)
Physical Therapy Treatment Patient Details Name: [name] [name] MRN: [id number] DOB: February 17, 1946 Today's Date: 04/09/2017    History of Present Illness Patient is a 71 yo female admitted 04/01/17 with RUE weakness and unsteady gait for several days.  Patient also with hypertensive urgency and hyperglycemia.   MRI of brain - Patchy acute infarcts in the left greater than right cerebral hemispheres with distribution suggesting watershed ischemia.     PMH:  old CVA per chart, HTN    PT Comments    Patient reports feels more confident having walker to hold, but not clear that safest device for her.  Will continue to benefit from skilled PT in the acute setting prior to d/c to CIR level rehab.   Follow Up Recommendations  CIR;Supervision for mobility/OOB     Equipment Recommendations  None recommended by PT    Recommendations for Other Services       Precautions / Restrictions Precautions Precautions: Fall Precaution Comments: Rt hemiparesis    Mobility  Bed Mobility Overal bed mobility: Needs Assistance Bed Mobility: Supine to Sit     Supine to sit: HOB elevated;Min assist     General bed mobility comments: assist for trunk, pt able to slowly scoot to EOB with cues and time  Transfers Overall transfer level: Needs assistance Equipment used: Rolling walker (2 wheeled) Transfers: Sit to/from Stand Sit to Stand: Min assist         General transfer comment: assist for safety, balance, pt posterior and hinging with knees on bed, cues for hand placement  Ambulation/Gait Ambulation/Gait assistance: Min assist Ambulation Distance (Feet): 120 Feet Assistive device: Rolling walker (2 wheeled) Gait Pattern/deviations: Step-to pattern;Decreased step length - left;Shuffle;Decreased dorsiflexion - right     General Gait Details: step to pattern with L LE; using walker initially keeping too far anterior, assist for R hand positioning on walker, able to progress to using better with  closer proximity, still help to turn walker and maneuver around some obstacles   Stairs            Wheelchair Mobility    Modified Rankin (Stroke Patients Only) Modified Rankin (Stroke Patients Only) Pre-Morbid Rankin Score: No symptoms Modified Rankin: Moderately severe disability     Balance Overall balance assessment: Needs assistance Sitting-balance support: No upper extremity supported Sitting balance-Leahy Scale: Fair Sitting balance - Comments: R bias; able to perform dynamic scooting to EOB with UE support no LOB   Standing balance support: Single extremity supported Standing balance-Leahy Scale: Poor Standing balance comment: use of UE support for balance and initially min A                            Cognition Arousal/Alertness: Awake/alert Behavior During Therapy: Flat affect   Area of Impairment: Attention;Following commands;Problem solving                   Current Attention Level: Selective   Following Commands: Follows one step commands consistently     Problem Solving: Slow processing;Difficulty sequencing        Exercises      General Comments        Pertinent Vitals/Pain Pain Score: 0-No pain    Home Living                      Prior Function            PT Goals (current goals can now be found  in the care plan section) Acute Rehab PT Goals Patient Stated Goal: To walk; get use of RUE back PT Goal Formulation: With patient/family Time For Goal Achievement: 04/16/17 Potential to Achieve Goals: Good Progress towards PT goals: Progressing toward goals    Frequency    Min 4X/week      PT Plan Current plan remains appropriate    Co-evaluation              AM-PAC PT "6 Clicks" Daily Activity  Outcome Measure  Difficulty turning over in bed (including adjusting bedclothes, sheets and blankets)?: A Little Difficulty moving from lying on back to sitting on the side of the bed? :  Total Difficulty sitting down on and standing up from a chair with arms (e.g., wheelchair, bedside commode, etc,.)?: Total Help needed moving to and from a bed to chair (including a wheelchair)?: A Little Help needed walking in hospital room?: A Little Help needed climbing 3-5 steps with a railing? : Total 6 Click Score: 12    End of Session Equipment Utilized During Treatment: Gait belt Activity Tolerance: Patient tolerated treatment well Patient left: in chair;with call bell/phone within reach;with family/visitor present   PT Visit Diagnosis: Other abnormalities of gait and mobility (R26.89);Hemiplegia and hemiparesis;Other symptoms and signs involving the nervous system (R29.898) Hemiplegia - Right/Left: Right Hemiplegia - dominant/non-dominant: Dominant Hemiplegia - caused by: Cerebral infarction     Time: 1222-1243 PT Time Calculation (min) (ACUTE ONLY): 21 min  Charges:  $Gait Training: 8-22 mins                    G CodesMagda Kiel, [place] 04/09/2017    Reginia Naas 04/09/2017, 1:13 PM

## 2017-04-09 NOTE — Progress Notes (Signed)
Called report to CIR, Bonnita Nasuti, Therapist, sports. Patient will be moving to inpatient rehab after she eats lunch this afternoon. Reviewed discharge information with patient as well.

## 2017-04-09 NOTE — Progress Notes (Signed)
Inpatient Rehabilitation  I visited pt. at the bedside and discussed with Dr. Candiss Norse.  He states pt. is medically ready for CIR today.  I will make all arrangements for pt. to admit later today.  Pt. is pleased and in agreement.  Please call if questions.  [facility] Admissions Coordinator Cell [phone] Office [phone]

## 2017-04-09 NOTE — Progress Notes (Signed)
Gerlean Ren Re[facility] Admission Coordinator Signed Physical Medicine and Rehabilitation  PMR Pre-admission Date of Service: 04/03/2017 2:48 PM  Related encounter: ED to Hosp-Admission (Current) from 04/01/2017 in [facility] 4E CV SURGICAL PROGRESSIVE CARE       [] Hide copied text PMR Admission Coordinator Pre-Admission Assessment  Patient: [name] [name] is an 71 y.o., female MRN: [id number] DOB: 01/14/1946 Height: 5\' 8"  (172.7 cm) Weight: 63.5 kg (140 lb)                                                                                                                                                  Insurance Information HMO:     PPO:      PCP:      IPA:      80/20:      OTHER: X PRIMARY: BCBS       Policy#: [id number]      Subscriber: Self CM Name: Jeanie Sewer      Phone#:  [phone]     Fax#: [phone] Pre-Cert#:  [id number], approved until 04/17/17, clinical updates due 04/16/17   Employer:  Benefits:  Phone #: [phone]     Name: verified online Eff. Date: 11/11/16     Deduct: $800      Out of Pocket Max: $2450 (includes deductible)       Life Max: N/A CIR: 70%/30%      SNF: 70%/30%, 60 day limit Outpatient: 30 visits      Co-Pay: $20 Home Health: 70%      Co-Pay: 30%, 60 day limit DME: 70%     Co-Pay: 30% Providers: in-network   Medicaid Application Date:       Case Manager:  Disability Application Date:       Case Worker:   Emergency Tax adviser Information    Name Relation Home Work [place] Daughter [phone]       Current Medical History  Patient Admitting Diagnosis: Watershed infarct, left parietal, with right hemiparesis and cognitive deficits  History of Present Illness: Bailley Guilford a 71 y.o.right handed femaleon no prescription medications. Per chart review patient independent prior to admission living alone. She does drive. One level home with 4 steps to entry.Family inarea question assistance on  discharge.Presented 04/01/2017 with right-sided weakness and unsteady gait 2 days. CT/MRI showed patchy acute infarcts in the left greater than right cerebral hemispheres with distribution suggesting watershed ischemia. [place] vessel ischemic changes. MRA with atherosclerotic type changes including severe right and moderate left ICA stenosis, severe bilateral vertebral artery stenosis.Carotid Doppler showed 1-39% right ICA stenosis and greater than 80% left ICA stenosis.MRI cervical spine with mild cervical disc degeneration. MRI of the chest negative. Patient did not receive TPA. Echocardiogram with ejection fraction of 63% grade 1 diastolic dysfunction.There was plan for TEE and possible loop  recorder placement however patient declined procedure at this time and plan to follow-up outpatient to discuss further options.Neurology consulted presently on aspirinand Plavixfor CVA prophylaxis. Subcutaneous Lovenox for DVT prophylaxis. Follow-up vascular surgery for carotid stenosis and underwent left carotid endarterectomy 04/08/2017 per Dr. Servando Snare. Tolerating a regular diet. Physical and occupational therapy evaluations completed with recommendations of physical medicine rehabilitation consult. Patient was admitted for comprehensive rehabilitation program.  NIH Total: 4  Past Medical History      Past Medical History:  Diagnosis Date  . Stroke [facility])     Family History  family history includes Cancer in her mother.  Prior Rehab/Hospitalizations:  Has the patient had major surgery during 100 days prior to admission? No  Current Medications   Current Facility-Administered Medications:  .  0.9 %  sodium chloride infusion, 250 mL, Intravenous, PRN, Dionne Milo, NP .  0.9 %  sodium chloride infusion, , Intravenous, Continuous, Bhagat, Bhavinkumar, PA .  0.9 %  sodium chloride infusion, 500 mL, Intravenous, Once PRN, Theda Sers, Emma M, PA-C .  0.9 %  sodium chloride  infusion, , Intravenous, Continuous, Collins, Emma M, PA-C, Last Rate: 50 mL/hr at 04/08/17 1636 .  acetaminophen (TYLENOL) tablet 650 mg, 650 mg, Oral, Q6H PRN **OR** acetaminophen (TYLENOL) suppository 650 mg, 650 mg, Rectal, Q6H PRN, Dionne Milo, NP .  alum & mag hydroxide-simeth (MAALOX/MYLANTA) 200-200-20 MG/5ML suspension 15-30 mL, 15-30 mL, Oral, Q2H PRN, Theda Sers, Emma M, PA-C .  amLODipine (NORVASC) tablet 2.5 mg, 2.5 mg, Oral, Daily, Hosie Poisson, MD, 2.5 mg at 04/07/17 0909 .  aspirin tablet 325 mg, 325 mg, Oral, Daily, Dionne Milo, NP, 325 mg at 04/07/17 0910 .  atorvastatin (LIPITOR) tablet 80 mg, 80 mg, Oral, q1800, Rinehuls, David L, PA-C, 80 mg at 04/08/17 1634 .  bisacodyl (DULCOLAX) EC tablet 5 mg, 5 mg, Oral, Daily PRN, Laurence Slate M, PA-C .  clopidogrel (PLAVIX) tablet 75 mg, 75 mg, Oral, Daily, Rosalin Hawking, MD, 75 mg at 04/07/17 0910 .  docusate sodium (COLACE) capsule 100 mg, 100 mg, Oral, Daily, Collins, Emma M, PA-C .  enoxaparin (LOVENOX) injection 40 mg, 40 mg, Subcutaneous, Q24H, Dionne Milo, NP, 40 mg at 04/08/17 1639 .  guaiFENesin-dextromethorphan (ROBITUSSIN DM) 100-10 MG/5ML syrup 15 mL, 15 mL, Oral, Q4H PRN, Theda Sers, Emma M, PA-C .  hydrALAZINE (APRESOLINE) injection 5 mg, 5 mg, Intravenous, Q20 Min PRN, Collins, Emma M, PA-C .  labetalol (NORMODYNE,TRANDATE) injection 10 mg, 10 mg, Intravenous, Q4H PRN, Dionne Milo, NP, 10 mg at 04/01/17 1320 .  labetalol (NORMODYNE,TRANDATE) injection 10 mg, 10 mg, Intravenous, Q10 min PRN, Theda Sers, Emma M, PA-C .  magnesium sulfate IVPB 2 g 50 mL, 2 g, Intravenous, Daily PRN, Theda Sers, Emma M, PA-C .  morphine 4 MG/ML injection 1-3 mg, 1-3 mg, Intravenous, Q1H PRN, Theda Sers, Emma M, PA-C .  ondansetron (ZOFRAN) tablet 4 mg, 4 mg, Oral, Q6H PRN **OR** ondansetron (ZOFRAN) injection 4 mg, 4 mg, Intravenous, Q6H PRN, Dionne Milo, NP .  oxyCODONE-acetaminophen (PERCOCET/ROXICET) 5-325 MG per  tablet 1-2 tablet, 1-2 tablet, Oral, Q4H PRN, Theda Sers, Emma M, PA-C .  pantoprazole (PROTONIX) EC tablet 40 mg, 40 mg, Oral, Daily, Laurence Slate M, PA-C, 40 mg at 04/08/17 1635 .  phenol (CHLORASEPTIC) mouth spray 1 spray, 1 spray, Mouth/Throat, PRN, Theda Sers, Emma M, PA-C .  potassium chloride SA (K-DUR,KLOR-CON) CR tablet 20-40 mEq, 20-40 mEq, Oral, Daily PRN, Theda Sers, Emma M, PA-C .  senna-docusate (Senokot-S) tablet 1 tablet,  1 tablet, Oral, QHS PRN, Dionne Milo, NP, 1 tablet at 04/05/17 [phone] .  sodium chloride flush (NS) 0.9 % injection 3 mL, 3 mL, Intravenous, Q12H, Dionne Milo, NP, 3 mL at 04/07/17 2255 .  sodium chloride flush (NS) 0.9 % injection 3 mL, 3 mL, Intravenous, PRN, Frederik Pear Lynnell Jude, NP  Patients Current Diet: Diet Heart Room service appropriate? Yes; Fluid consistency: Thin  Precautions / Restrictions Precautions Precautions: Fall Precaution Comments: Rt hemiparesis Restrictions Weight Bearing Restrictions: No   Has the patient had 2 or more falls or a fall with injury in the past year?No  Prior Activity Level Community (5-7x/wk): Prior to admission patient lived alone and was fully independent working for the [employer].   Home Assistive Devices / Equipment Home Assistive Devices/Equipment: Eyeglasses Home Equipment: None  Prior Device Use: Indicate devices/aids used by the patient prior to current illness, exacerbation or injury? None of the above  Prior Functional Level Prior Function Level of Independence: Independent Comments: Drives, works for [employer]: Did the patient need help bathing, dressing, using the toilet or eating? Independent  Indoor Mobility: Did the patient need assistance with walking from room to room (with or without device)? Independent  Stairs: Did the patient need assistance with internal or external stairs (with or without device)? Independent  Functional  Cognition: Did the patient need help planning regular tasks such as shopping or remembering to take medications? Independent  Current Functional Level Cognition  Arousal/Alertness: Awake/alert Overall Cognitive Status: Impaired/Different from baseline Current Attention Level: Selective Orientation Level: Oriented X4 Following Commands: Follows one step commands inconsistently General Comments: When performing seated ADL, Pt struggled with problem solving how to do tasks that were typically 2 handed tasks with one hand Attention: Sustained Sustained Attention: Impaired Sustained Attention Impairment: Verbal basic, Functional basic Memory: Impaired Memory Impairment: Storage deficit, Decreased recall of new information, Decreased short term memory Decreased Short Term Memory: Verbal basic, Functional basic Awareness: Impaired Awareness Impairment: Intellectual impairment Problem Solving: Impaired Problem Solving Impairment: Verbal basic, Functional basic Executive Function: Reasoning, Sequencing, Initiating Reasoning: Impaired Reasoning Impairment: Verbal basic, Functional basic Sequencing: Impaired Sequencing Impairment: Verbal basic, Functional basic Initiating: Impaired Initiating Impairment: Verbal basic, Functional basic Safety/Judgment: Impaired Comments: reduced awareness/insight    Extremity Assessment (includes Sensation/Coordination)  Upper Extremity Assessment: RUE deficits/detail RUE Deficits / Details: PROM WFL, [place] I hand and arm  RUE Coordination: decreased fine motor, decreased gross motor  Lower Extremity Assessment: Defer to PT evaluation RLE Deficits / Details: Decreased strength - hip flexion 3/5, knee extension 3+/5, DF 0/5.  When she attempts to DF ankle, her ankle moves into PF. RLE Coordination: decreased gross motor    ADLs  Overall ADL's : Needs assistance/impaired Eating/Feeding: Set up, Bed level Grooming: Moderate assistance,  Sitting Grooming Details (indicate cue type and reason): in recliner Upper Body Bathing: Moderate assistance, Sitting Upper Body Bathing Details (indicate cue type and reason): using wash cloth, HOH with RUE Lower Body Bathing: Moderate assistance, Sit to/from stand Upper Body Dressing : Minimal assistance, Sitting Lower Body Dressing: Maximal assistance, Sit to/from stand Toilet Transfer: Minimal assistance, Ambulation, Comfort height toilet (1 HHA) Toilet Transfer Details (indicate cue type and reason): when RUE is positioned prior to transfer, Pt will bear weight through it Toileting- Clothing Manipulation and Hygiene: Sit to/from stand, Moderate assistance Functional mobility during ADLs: Minimal assistance General ADL Comments: Focus of session on facilitation of RUE functional movemetn patterns using PNF and NDT techniques.  Movement improved with proprioceptive input and repetiion of movement patterns.     Mobility  Overal bed mobility: Needs Assistance Bed Mobility: Supine to Sit Supine to sit: Min assist, HOB elevated Sit to supine: Min assist General bed mobility comments: Facilitation for side lying to supine    Transfers  Overall transfer level: Needs assistance Equipment used: 1 person hand held assist Transfers: Sit to/from Stand Sit to Stand: Min assist Stand pivot transfers: Min assist General transfer comment: Min assist for balance support and safety. Placed R hand on arm rest of the chair to stand and pt was able to push up and put some weight through it.    Ambulation / Gait / Stairs / Wheelchair Mobility  Ambulation/Gait Ambulation/Gait assistance: Min assist, +2 safety/equipment Ambulation Distance (Feet): 100 Feet Assistive device: 1 person hand held assist Gait Pattern/deviations: Step-through pattern, Decreased stride length, Shuffle, Decreased dorsiflexion - right, Decreased step length - right, Trunk flexed General Gait Details: VC's for sequencing,  improved posture, and increased step/stride length with more aggressive heel strike. Pt was unable to make corrective changes. Was able to participate in higher level balance activity including side stepping to the R and L, backwards walking, and stepping over an object. Increased assistance reqired for backwards walking and stepping over an object.  Gait velocity: Decreased Gait velocity interpretation: Below normal speed for age/gender    Posture / Balance Dynamic Sitting Balance Sitting balance - Comments: R bias Balance Overall balance assessment: Needs assistance Sitting-balance support: Single extremity supported, Feet supported Sitting balance-Leahy Scale: Fair Sitting balance - Comments: R bias Standing balance support: Single extremity supported Standing balance-Leahy Scale: Poor Standing balance comment: Required use of LUE and mod A to maintain balance during functional mobility     Special needs/care consideration BiPAP/CPAP: No CPM: No Continuous Drip IV: No Dialysis: No         Life Vest: No Oxygen: No Special Bed: No Trach Size: No Wound Vac (area): No       Skin:  Surgical incision  L neck from CEA                            Bowel mgmt: 04/06/17, continent  Bladder mgmt: Continent  Diabetic mgmt: Hemoglobin A1c 5.7     Previous Home Environment Living Arrangements: Alone  Lives With: Alone Available Help at Discharge: Family, Available PRN/intermittently Type of Home: House Home Layout: One level Home Access: Stairs to enter Entrance Stairs-Rails: None Technical brewer of Steps: 4 Bathroom Shower/Tub: Chiropodist: Standard Home Care Services: No  Discharge Living Setting Plans for Discharge Living Setting: Patient's home, House, Alone Type of Home at Discharge: House Discharge Home Layout: One level Discharge Home Access: Stairs to enter Entrance Stairs-Rails: None Entrance Stairs-Number of Steps: 4 Discharge Bathroom  Shower/Tub: Tub/shower unit Discharge Bathroom Toilet: Standard Discharge Bathroom Accessibility: Yes How Accessible: Accessible via walker Does the patient have any problems obtaining your medications?: No  Social/Family/Support Systems Patient Roles: Parent Contact Information: Daughter: [name] [name] [phone] Anticipated Caregiver: Daughter stated that patient would have 24/7 if she needed it  Anticipated Caregiver's Contact Information: see above Caregiver Availability: 24/7 Discharge Plan Discussed with Primary Caregiver: Yes Is Caregiver In Agreement with Plan?: Yes Does Caregiver/Family have Issues with Lodging/Transportation while Pt is in Rehab?: No  Goals/Additional Needs Patient/Family Goal for Rehab: PT/OT/SLP Supervision  Expected length of stay: 10-14 days  Cultural Considerations: None Dietary Needs: Heart Healthy  Equipment Needs: TBD Special Service Needs: None Additional Information: None Pt/Family Agrees to Admission and willing to participate: Yes Program Orientation Provided & Reviewed with Pt/Caregiver Including Roles  & Responsibilities: Yes Additional Information Needs: None Information Needs to be Provided By: N/A   Decrease burden of Care through IP rehab admission: No  Possible need for SNF placement upon discharge: No  Patient Condition: This patient's medical and functional status has changed since the consult dated: 04/02/17 in which the Rehabilitation Physician determined and documented that the patient's condition is appropriate for intensive rehabilitative care in an inpatient rehabilitation facility. See "History of Present Illness" (above) for medical update. Functional changes are: pt. At min assist with bed mobility and transfers, +2 min assist (hand hold) 100' with balance deficits. Patient's medical and functional status update has been discussed with the Rehabilitation physician and patient remains appropriate for inpatient  rehabilitation. Will admit to inpatient rehab today.  Preadmission Screen Completed By:  Gerlean Ren 04/09/17  ______________________________________________________________________   Discussed status with Dr. Posey Pronto on 04/09/17 at  [phone]  and received telephone approval for admission today.  Admission Coordinator:  Gerlean Ren, time [address]umbling 04/09/17       Cosigned by: Jamse Arn, MD at 04/09/2017 8:46 AM  Revision History

## 2017-04-09 NOTE — Telephone Encounter (Signed)
Sched appt 04/30/17 at 9:30. Spoke to pt's daughter to confirm appt.

## 2017-04-09 NOTE — Care Management Note (Signed)
Case Management Note  Patient Details  Name: Rojean Ige MRN: [id number] Date of Birth: 02/10/1946  Subjective/Objective:    Patient for dc to CIR today.                Action/Plan:   Expected Discharge Date:  04/09/17               Expected Discharge Plan:  [place]  In-House Referral:     Discharge planning Services  CM Consult  Post Acute Care Choice:    Choice offered to:     DME Arranged:    DME Agency:     HH Arranged:    HH Agency:     Status of Service:  Completed, signed off  If discussed at [employer], dates discussed:    Additional Comments:  Zenon Mayo, RN 04/09/2017, 11:03 AM

## 2017-04-09 NOTE — Telephone Encounter (Signed)
-----   Message from Mena Goes, RN sent at 04/09/2017  9:36 AM EDT ----- Regarding: 2-3 weeks   ----- Message ----- From: Ulyses Amor, PA-C Sent: 04/09/2017   7:25 AM To: [facility]vs Charge Pool  S/P left [facility] after acute stroke f/u with Dr. Donzetta Matters in 2-3 weeks.

## 2017-04-09 NOTE — Progress Notes (Signed)
Patient arrived on unit. Oriented to unit procedures. No questions at this time, no complaints of pain.

## 2017-04-09 NOTE — Discharge Summary (Signed)
[name] [name] ZOX:[id number] DOB: 1946/10/05 DOA: 04/01/2017  PCP: Patient, No Pcp Per  Admit date: 04/01/2017  Discharge date: 04/09/2017  Admitted From: Home   Disposition:  SNF   Recommendations for Outpatient Follow-up:   Follow up with PCP in 1-2 weeks  PCP Please obtain BMP/CBC, 2 view CXR in 1week,  (see Discharge instructions)   PCP Please follow up on the following pending results:    Home Health: None   Equipment/Devices: None  Consultations: Neuro, Vas. Surg Discharge Condition: Stable   CODE STATUS: Full   Diet Recommendation:  Heart Healthy    Chief Complaint  Patient presents with  . Extremity Weakness    paralysis      Brief history of present illness from the day of admission and additional interim summary    [name] [name] a 71 y.o.femalewho presents with R arm weakness concerning for stroke. Neuro consulted and recommendations given. She was found to have patchyacute infarcts in the left greater than right cerebral hemispheres suggesting watershed ischemia. Carotid duples showing mod left ICA Stenosis, it was followed up with a CTA of the head and neck showing Critical proximal left ICA stenosis due to atheromatous plaque. Vascular consulted for recommendations. Plan to proceed with left carotid endarterectomy with Dr. Donzetta Matters 5/29.                                                                 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 She is status post left carotid and ostectomy by Dr. Donzetta Matters this admission, plan is to keep her on aspirin and Plavix for 3 months thereafter only Plavix but must seen urology before that, also LDL was above desired goal and around 1:30 hence  she was placed on high-dose statin. She will require CIR for continued rehabilitation. There was initially discussion about TEE and loop recorder but initially patient refused and then neuro thought that it won't be needed as her symptoms could be explained from the left carotid disease. She must follow with neuropathy in a month after discharge.   Left carotid artery stenosis. Status post surgery by Dr. Donzetta Matters. Site appears stable. 2 and a platelet therapy and statin for secondary prevention, follow with Dr. Donzetta Matters within a month as well.  Hypertensive urgency: resolved now - Currently on low-dose Norvasc monitor and adjust  Hypokalemia: repleted.   MRI evidence of mild pituitary enlargement. Recommend outpatient endocrine follow-up.   Discharge diagnosis     Principal Problem:   Stroke-like symptoms Active Problems:   Hypertensive emergency   Hypokalemia   Hyperglycemia   Stroke (cerebrum) (HCC)   Gait disturbance, post-stroke   Right arm weakness   Slowness and poor responsiveness  Stenosis of left carotid artery   Hyperlipidemia    Discharge instructions    Discharge Instructions    Ambulatory referral to Neurology    Complete by:  As directed    Follow up with stroke clinic Cecille Rubin preferred, if not available, then consider [facility]nut, [facility] or Jaynee Eagles whoever is available) at [facility] in about 6-8 weeks. Thanks.   Diet - low sodium heart healthy    Complete by:  As directed    Discharge instructions    Complete by:  As directed    Follow with Primary MD in 7 days   Get CBC, CMP, 2 view Chest X ray checked  by Primary MD or SNF MD in 5-7 days ( we routinely change or add medications that can affect your baseline labs and fluid status, therefore we recommend that you get the mentioned basic workup next visit with your PCP, your PCP may decide not to get them or add new tests based on their clinical decision)  Activity: As tolerated with Full fall precautions use  walker/cane & assistance as needed  Disposition CIR  Diet:  Heart Healthy    For Heart failure patients - Check your Weight same time everyday, if you gain over 2 pounds, or you develop in leg swelling, experience more shortness of breath or chest pain, call your Primary MD immediately. Follow Cardiac Low Salt Diet and 1.5 lit/day fluid restriction.  On your next visit with your primary care physician please Get Medicines reviewed and adjusted.  Please request your Prim.MD to go over all Hospital Tests and Procedure/Radiological results at the follow up, please get all Hospital records sent to your Prim MD by signing hospital release before you go home.  If you experience worsening of your admission symptoms, develop shortness of breath, life threatening emergency, suicidal or homicidal thoughts you must seek medical attention immediately by calling 911 or calling your MD immediately  if symptoms less severe.  You Must read complete instructions/literature along with all the possible adverse reactions/side effects for all the Medicines you take and that have been prescribed to you. Take any new Medicines after you have completely understood and accpet all the possible adverse reactions/side effects.   Do not drive, operate heavy machinery, perform activities at heights, swimming or participation in water activities or provide baby sitting services if your were admitted for syncope or siezures until you have seen by Primary MD or a Neurologist and advised to do so again.  Do not drive when taking Pain medications.    Do not take more than prescribed Pain, Sleep and Anxiety Medications  Special Instructions: If you have smoked or chewed Tobacco  in the last 2 yrs please stop smoking, stop any regular Alcohol  and or any Recreational drug use.  Wear Seat belts while driving.   Please note  You were cared for by a hospitalist during your hospital stay. If you have any questions about your  discharge medications or the care you received while you were in the hospital after you are discharged, you can call the unit and asked to speak with the hospitalist on call if the hospitalist that took care of you is not available. Once you are discharged, your primary care physician will handle any further medical issues. Please note that NO REFILLS for any discharge medications will be authorized once you are discharged, as it is imperative that you return to your primary care physician (or establish a relationship with a primary care  physician if you do not have one) for your aftercare needs so that they can reassess your need for medications and monitor your lab values.   Increase activity slowly    Complete by:  As directed       Discharge Medications   Allergies as of 04/09/2017   No Known Allergies     Medication List    TAKE these medications   amLODipine 2.5 MG tablet Commonly known as:  NORVASC Take 1 tablet (2.5 mg total) by mouth daily. Start taking on:  04/10/2017   aspirin 325 MG tablet Take 1 tablet (325 mg total) by mouth daily. Start taking on:  04/10/2017   atorvastatin 80 MG tablet Commonly known as:  LIPITOR Take 1 tablet (80 mg total) by mouth daily at 6 PM.   bisacodyl 5 MG EC tablet Commonly known as:  DULCOLAX Take 1 tablet (5 mg total) by mouth daily as needed for moderate constipation.   clopidogrel 75 MG tablet Commonly known as:  PLAVIX Take 1 tablet (75 mg total) by mouth daily. Start taking on:  04/10/2017   EYE DROPS ALLERGY RELIEF OP Place 2 drops into both eyes 2 (two) times daily.   multivitamin with minerals tablet Take 1 tablet by mouth daily.   pantoprazole 40 MG tablet Commonly known as:  PROTONIX Take 1 tablet (40 mg total) by mouth daily. Start taking on:  04/10/2017       Follow-up Information    Chanetta Marshall K, NP Follow up on 05/21/2017.   Specialty:  Cardiology Why:  at 10:40AM  Contact information: [place] [address] [phone]        Dennie Bible, NP. Schedule an appointment as soon as possible for a visit in 6 week(s).   Specialty:  Family Medicine Contact information: [address] [place] [address] [phone]        Waynetta Sandy, MD Follow up in 2 week(s).   Specialties:  Vascular Surgery, Cardiology Why:  office will call with appt.  Contact information: [address] [place] [address] [phone]        Judith Part, MD .   Specialty:  Neurosurgery Contact information: [address] [place] [address] [phone]        Rosalin Hawking, MD. Schedule an appointment as soon as possible for a visit in 3 week(s).   Specialty:  Neurology Contact information: [address] [place] [address] [phone]        Renato Shin, MD. Schedule an appointment as soon as possible for a visit in 3 week(s).   Specialty:  Endocrinology Why:  enlarged pitutary  Contact information: [address] [place] [address] [phone]           Major procedures and Radiology Reports - PLEASE review detailed and final reports thoroughly  -     TTE  Study Conclusions  - Left ventricle: The cavity size was normal. Wall thickness was   increased in a pattern of mild LVH. Systolic function was   vigorous. The estimated ejection fraction was in the range of 65%   to 70%. Wall motion was normal; there were no regional wall   motion abnormalities. Doppler parameters are consistent with   abnormal left ventricular relaxation (grade 1 diastolic   dysfunction). Doppler parameters are consistent with high   ventricular filling pressure. - Mitral valve: Calcified annulus.  Impressions:  - Vigorous LV  systolic function; mild LVH; mild diastolic   dysfunction; elevated LV filling pressure; sclerotic aortic valve   with no AS or AI; trace MR and TR.    Ct  Angio Head W Or Wo Contrast  Result Date: 04/04/2017 CLINICAL DATA:  Stroke. EXAM: CT ANGIOGRAPHY HEAD AND NECK TECHNIQUE: Multidetector CT imaging of the head and neck was performed using the standard protocol during bolus administration of intravenous contrast. Multiplanar CT image reconstructions and MIPs were obtained to evaluate the vascular anatomy. Carotid stenosis measurements (when applicable) are obtained utilizing NASCET criteria, using the distal internal carotid diameter as the denominator. CONTRAST:  50 cc Isovue 370 intravenous COMPARISON:  Brain MRI from 3 days prior FINDINGS: CT HEAD FINDINGS Brain: Marohl watershed infarcts along the cerebral convexities that are underestimated relative to previous MRI. No detected progression or hemorrhagic conversion. Remote lacunar infarct in the genu of the right internal capsule. Extensive, confluent chronic Sardinha vessel ischemic gliosis in the cerebral white matter. Cerebellar volume loss. Vascular: See below Skull: No acute or aggressive finding Sinuses: Negative Orbits: Negative Review of the MIP images confirms the above findings CTA NECK FINDINGS Aortic arch: 2 vessel branching pattern. Diffuse atheromatous wall thickening. Negative for visualized aneurysm. Right carotid system: Shelf-like filling defect in the posterior wall at the distal common carotid without flow limiting stenosis. Left carotid system: Predominant noncalcified plaque at the common carotid bifurcation and ICA bulb with severe proximal left ICA stenosis (lumen measuring ~1 mm at its narrowest). There is also advanced narrowing at the ECA origin Vertebral arteries: No proximal subclavian stenosis. No brachiocephalic narrowing. Atheromatous plaque in the bilateral V1 and V2 segments. Mild to moderate narrowing at the right V1 and V2 segment. No branch occlusion or dissection noted. Skeleton: No acute or aggressive finding. Other neck: No incidental mass or adenopathy. Left submandibular  glands resection or atrophy. Upper chest: No acute finding Review of the MIP images confirms the above findings CTA HEAD FINDINGS Anterior circulation: Atherosclerotic plaque in the bilateral carotid siphons with severe right cavernous stenosis at the midportion and separately at the anterior genu. Mild right supraclinoid ICA narrowing. Moderate to advanced right M1 segment stenosis. Moderate left ICA stenosis at the anterior genu cavernous carotid. Mild proximal left M1 segment stenosis. The right A1 segment is hypoplastic. No branch occlusion noted. Posterior circulation: Two advanced stenoses in the right V4 segment, just beyond the dura in just before the vertebrobasilar junction. Severe proximal left V4 segment stenosis at an irregular mixed density plaque. Advanced right P2 segment stenosis and PCA branch atheromatous irregularity bilaterally Venous sinuses: Patent Anatomic variants: None significant Delayed phase: No abnormal intracranial enhancement. Review of the MIP images confirms the above findings IMPRESSION: CTA neck: 1. Critical proximal left ICA stenosis due to atheromatous plaque. 2. Web or shelf-like plaque in the right distal common carotid artery without stenosis. 3. Mild to moderate right V1 and V2 segment stenoses. CTA head: 1. Advanced atherosclerosis. 2. Tandem advanced right cavernous ICA stenoses. Moderate left ICA stenosis. 3. Tandem right and single left V4 advanced stenoses. 4. Diffuse atherosclerotic irregularity of medium sized ACA, MCA, and PCA branches. Notable high-grade right M1 and right P2 segment stenoses. 5. 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 6. Extensive chronic Mathieson vessel ischemia in the cerebral white matter. Electronically Signed   By: Monte Fantasia M.D.   On: 04/04/2017 13:19   Ct Head Wo Contrast  Result Date: 04/01/2017 CLINICAL DATA:  Right arm paralysis  for several days, initial encounter EXAM: CT HEAD  WITHOUT CONTRAST TECHNIQUE: Contiguous axial images were obtained from the base of the skull through the vertex without intravenous contrast. COMPARISON:  None. FINDINGS: Brain: Chronic white matter ischemic change is seen. No findings to suggest acute hemorrhage, acute infarction or space-occupying mass lesion are noted. Prior lacunar infarcts are noted within the internal capsule on the right in the basal ganglia on the left. Vascular: No hyperdense vessel or unexpected calcification. Skull: Normal. Negative for fracture or focal lesion. Sinuses/Orbits: No acute finding. Other: None. IMPRESSION: Chronic white matter ischemic change. Lacunar infarcts are noted. No acute abnormality is seen. Electronically Signed   By: Inez Catalina M.D.   On: 04/01/2017 10:53   Ct Angio Neck W Or Wo Contrast  Result Date: 04/04/2017 CLINICAL DATA:  Stroke. EXAM: CT ANGIOGRAPHY HEAD AND NECK TECHNIQUE: Multidetector CT imaging of the head and neck was performed using the standard protocol during bolus administration of intravenous contrast. Multiplanar CT image reconstructions and MIPs were obtained to evaluate the vascular anatomy. Carotid stenosis measurements (when applicable) are obtained utilizing NASCET criteria, using the distal internal carotid diameter as the denominator. CONTRAST:  50 cc Isovue 370 intravenous COMPARISON:  Brain MRI from 3 days prior FINDINGS: CT HEAD FINDINGS Brain: Davidovich watershed infarcts along the cerebral convexities that are underestimated relative to previous MRI. No detected progression or hemorrhagic conversion. Remote lacunar infarct in the genu of the right internal capsule. Extensive, confluent chronic Oliveto vessel ischemic gliosis in the cerebral white matter. Cerebellar volume loss. Vascular: See below Skull: No acute or aggressive finding Sinuses: Negative Orbits: Negative Review of the MIP images confirms the above findings CTA NECK FINDINGS Aortic arch: 2 vessel branching pattern.  Diffuse atheromatous wall thickening. Negative for visualized aneurysm. Right carotid system: Shelf-like filling defect in the posterior wall at the distal common carotid without flow limiting stenosis. Left carotid system: Predominant noncalcified plaque at the common carotid bifurcation and ICA bulb with severe proximal left ICA stenosis (lumen measuring ~1 mm at its narrowest). There is also advanced narrowing at the ECA origin Vertebral arteries: No proximal subclavian stenosis. No brachiocephalic narrowing. Atheromatous plaque in the bilateral V1 and V2 segments. Mild to moderate narrowing at the right V1 and V2 segment. No branch occlusion or dissection noted. Skeleton: No acute or aggressive finding. Other neck: No incidental mass or adenopathy. Left submandibular glands resection or atrophy. Upper chest: No acute finding Review of the MIP images confirms the above findings CTA HEAD FINDINGS Anterior circulation: Atherosclerotic plaque in the bilateral carotid siphons with severe right cavernous stenosis at the midportion and separately at the anterior genu. Mild right supraclinoid ICA narrowing. Moderate to advanced right M1 segment stenosis. Moderate left ICA stenosis at the anterior genu cavernous carotid. Mild proximal left M1 segment stenosis. The right A1 segment is hypoplastic. No branch occlusion noted. Posterior circulation: Two advanced stenoses in the right V4 segment, just beyond the dura in just before the vertebrobasilar junction. Severe proximal left V4 segment stenosis at an irregular mixed density plaque. Advanced right P2 segment stenosis and PCA branch atheromatous irregularity bilaterally Venous sinuses: Patent Anatomic variants: None significant Delayed phase: No abnormal intracranial enhancement. Review of the MIP images confirms the above findings IMPRESSION: CTA neck: 1. Critical proximal left ICA stenosis due to atheromatous plaque. 2. Web or shelf-like plaque in the right distal  common carotid artery without stenosis. 3. Mild to moderate right V1 and V2 segment stenoses. CTA head: 1. Advanced  atherosclerosis. 2. Tandem advanced right cavernous ICA stenoses. Moderate left ICA stenosis. 3. Tandem right and single left V4 advanced stenoses. 4. Diffuse atherosclerotic irregularity of medium sized ACA, MCA, and PCA branches. Notable high-grade right M1 and right P2 segment stenoses. 5. 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 6. Extensive chronic Flath vessel ischemia in the cerebral white matter. Electronically Signed   By: Monte Fantasia M.D.   On: 04/04/2017 13:19   Mr Brain Wo Contrast  Result Date: 04/01/2017 CLINICAL DATA:  Right arm weakness and unsteady gait for 2 days. EXAM: MRI HEAD WITHOUT CONTRAST MRA HEAD WITHOUT CONTRAST MRI CERVICAL SPINE WITHOUT CONTRAST TECHNIQUE: Multiplanar, multiecho pulse sequences of the brain and surrounding structures were obtained without intravenous contrast. Angiographic images of the head were obtained using MRA technique without contrast. Multiplanar, multiecho pulse sequences of the cervical spine were obtained without intravenous contrast. COMPARISON:  Head CT 04/01/2017 FINDINGS: MRI HEAD FINDINGS Brain: There are numerous Fullard patchy acute infarcts predominantly sagittally oriented in the left cerebral hemisphere involving cortex and white matter with greatest involvement of the centrum semiovale suggestive of watershed ischemia. There are a few tiny acute infarcts in a similar distribution in the right cerebral hemisphere predominantly in the frontal and parietal lobes near the vertex. A 6 mm acute infarct is present in the genu of the corpus callosum. A chronic microhemorrhage is noted in the right thalamus. Patchy to confluent T2 hyperintensities in the cerebral white matter bilaterally are nonspecific but compatible with moderate chronic Gaertner vessel ischemia, advanced for age. Chronic  lacunar infarcts are present in the cerebellum, bilateral basal ganglia, right internal capsule, and corpus callosum and deep cerebral white matter bilaterally. There is mild cerebral and moderate cerebellar atrophy. No mass, midline shift, or extra-axial fluid collection is seen. Mild to moderate chronic Lauder vessel ischemic changes are present in the pons with a tiny chronic infarct noted. The pituitary gland is mildly prominent in size for age, measuring 8 mm in height. Vascular: Major intracranial vascular flow voids are preserved. Skull and upper cervical spine: Unremarkable bone marrow signal. Sinuses/Orbits: Unremarkable orbits. Paranasal sinuses and mastoid air cells are clear. Other: None. MRA HEAD FINDINGS The visualized distal vertebral arteries are patent to the basilar with the left being slightly larger than the right. There is diffuse V4 segment atherosclerotic irregularity with severe stenoses bilaterally, greater on the left. AICAs appear patent proximally with a severe proximal stenosis on the right. SCA origins are patent bilaterally. The basilar artery is patent with mild irregularity but no significant stenosis. Posterior communicating arteries are not identified. PCAs are patent with a severe proximal P2 stenosis on the right and moderate to prominent branch vessel irregularity and narrowing bilaterally. The internal carotid arteries are patent from skullbase to carotid termini with advanced siphon atherosclerosis resulting in severe tandem cavernous stenoses on the right and moderate stenosis on the left. 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 The left A1 segment is patent with mild irregularity proximally but no significant stenosis and supplies the right ACA via the anterior communicating artery. There are severe A2 stenoses bilaterally. The MCAs are patent without evidence of significant M1 stenosis or proximal branch occlusion. There is moderate MCA branch  vessel atherosclerotic irregularity and narrowing bilaterally. No aneurysm is identified. MRI CERVICAL SPINE FINDINGS The study is mildly motion degraded. Alignment: Normal. Vertebrae: No evidence of fracture, osseous lesion, or significant marrow edema. Cord: Normal signal and morphology. Posterior  Fossa, vertebral arteries, paraspinal tissues: Cerebral atrophy and infarcts as described above. Preserved vertebral artery flow voids in the neck. Disc levels: C2-3:  Negative. C3-4: Disc bulging and uncovertebral spurring result in mild right neural foraminal stenosis without spinal stenosis. C4-5: Minimal disc bulging and uncovertebral spurring without significant stenosis. C5-6:  Minimal disc bulging without stenosis. C6-7: Bissonnette central disc extrusion with slight caudal migration without stenosis or significant spinal cord mass effect. C7-T1: Stuller right paracentral disc protrusion without significant stenosis. IMPRESSION: 1. Patchy acute infarcts in the left greater than right cerebral hemispheres with distribution suggesting watershed ischemia. 2. Age advanced chronic Weckwerth vessel ischemic disease with numerous chronic infarcts as above. 3. Mild prominence of the pituitary gland for age. Consider laboratory correlation. 4. Advanced intracranial atherosclerosis as above including severe right and moderate left ICA stenoses, severe bilateral vertebral artery stenoses, and severe right P2 PCA stenosis. 5. Mild cervical disc degeneration without high-grade stenosis as above. Electronically Signed   By: Logan Bores M.D.   On: 04/01/2017 17:15   Mr Chest W Wo Contrast  Result Date: 04/02/2017 CLINICAL DATA:  Right arm weakness and unsteady gait for 2 days. EXAM: MRI BRACHIAL PLEXUS WITHOUT CONTRAST TECHNIQUE: Multiplanar, multiecho pulse sequences of the neck and surrounding structures were obtained without intravenous contrast. The field of view was focused on the right brachial plexus from the neural foramina to  the axilla. CONTRAST:  2mL MULTIHANCE GADOBENATE DIMEGLUMINE 529 MG/ML IV SOLN COMPARISON:  None. FINDINGS: Despite efforts by the technologist and patient, motion artifact is present on today's exam and could not be eliminated. This reduces exam sensitivity and specificity. Spinal cord No specific abnormality were visualized; todays exam was not a dedicated study of the cervical or thoracic spine. Brachial plexus: No appreciable abnormal edema, mass effect, or other specific abnormality along the components of the right brachial plexus. 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 Other findings No supplemental non-categorized findings. IMPRESSION: 1. No significant abnormality of the brachial plexus is identified. 2. Cervical and thoracic spondylosis. 3. 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 Electronically Signed   By: Van Clines M.D.   On: 04/02/2017 07:10   Mr Cervical Spine Wo Contrast  Result Date: 04/01/2017 CLINICAL DATA:  Right arm weakness and unsteady gait for 2 days. EXAM: MRI HEAD WITHOUT CONTRAST MRA HEAD WITHOUT CONTRAST MRI CERVICAL SPINE WITHOUT CONTRAST TECHNIQUE: Multiplanar, multiecho pulse sequences of the brain and surrounding structures were obtained without intravenous contrast. Angiographic images of the head were obtained using MRA technique without contrast. Multiplanar, multiecho pulse sequences of the cervical spine were obtained without intravenous contrast. COMPARISON:  Head CT 04/01/2017 FINDINGS: MRI HEAD FINDINGS Brain: There are numerous [place] patchy acute infarcts predominantly sagittally oriented in the left cerebral hemisphere involving cortex and white matter with greatest involvement of the centrum semiovale suggestive of watershed ischemia. There are a few tiny acute infarcts in a similar distribution in the right cerebral hemisphere  predominantly in the frontal and parietal lobes near the vertex. A 6 mm acute infarct is present in the genu of the corpus callosum. A chronic microhemorrhage is noted in the right thalamus. Patchy to confluent T2 hyperintensities in the cerebral white matter bilaterally are nonspecific but compatible with moderate chronic [place] vessel ischemia, advanced for age. Chronic lacunar infarcts are present in the cerebellum, bilateral basal ganglia, right internal capsule, and corpus callosum and deep cerebral white matter  bilaterally. There is mild cerebral and moderate cerebellar atrophy. No mass, midline shift, or extra-axial fluid collection is seen. Mild to moderate chronic Bunch vessel ischemic changes are present in the pons with a tiny chronic infarct noted. The pituitary gland is mildly prominent in size for age, measuring 8 mm in height. Vascular: Major intracranial vascular flow voids are preserved. Skull and upper cervical spine: Unremarkable bone marrow signal. Sinuses/Orbits: Unremarkable orbits. Paranasal sinuses and mastoid air cells are clear. Other: None. MRA HEAD FINDINGS The visualized distal vertebral arteries are patent to the basilar with the left being slightly larger than the right. There is diffuse V4 segment atherosclerotic irregularity with severe stenoses bilaterally, greater on the left. AICAs appear patent proximally with a severe proximal stenosis on the right. SCA origins are patent bilaterally. The basilar artery is patent with mild irregularity but no significant stenosis. Posterior communicating arteries are not identified. PCAs are patent with a severe proximal P2 stenosis on the right and moderate to prominent branch vessel irregularity and narrowing bilaterally. The internal carotid arteries are patent from skullbase to carotid termini with advanced siphon atherosclerosis resulting in severe tandem cavernous stenoses on the right and moderate stenosis on the left. 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 The left A1 segment is patent with mild irregularity proximally but no significant stenosis and supplies the right ACA via the anterior communicating artery. There are severe A2 stenoses bilaterally. The MCAs are patent without evidence of significant M1 stenosis or proximal branch occlusion. There is moderate MCA branch vessel atherosclerotic irregularity and narrowing bilaterally. No aneurysm is identified. MRI CERVICAL SPINE FINDINGS The study is mildly motion degraded. Alignment: Normal. Vertebrae: No evidence of fracture, osseous lesion, or significant marrow edema. Cord: Normal signal and morphology. Posterior Fossa, vertebral arteries, paraspinal tissues: Cerebral atrophy and infarcts as described above. Preserved vertebral artery flow voids in the neck. Disc levels: C2-3:  Negative. C3-4: Disc bulging and uncovertebral spurring result in mild right neural foraminal stenosis without spinal stenosis. C4-5: Minimal disc bulging and uncovertebral spurring without significant stenosis. C5-6:  Minimal disc bulging without stenosis. C6-7: Prosch central disc extrusion with slight caudal migration without stenosis or significant spinal cord mass effect. C7-T1: Blumer right paracentral disc protrusion without significant stenosis. IMPRESSION: 1. Patchy acute infarcts in the left greater than right cerebral hemispheres with distribution suggesting watershed ischemia. 2. Age advanced chronic [place] vessel ischemic disease with numerous chronic infarcts as above. 3. Mild prominence of the pituitary gland for age. Consider laboratory correlation. 4. Advanced intracranial atherosclerosis as above including severe right and moderate left ICA stenoses, severe bilateral vertebral artery stenoses, and severe right P2 PCA stenosis. 5. Mild cervical disc degeneration without high-grade stenosis as above. Electronically Signed   By: Logan Bores M.D.   On: 04/01/2017 17:15   Mr [name]  Head/brain FA Cm  Result Date: 04/01/2017 CLINICAL DATA:  Right arm weakness and unsteady gait for 2 days. EXAM: MRI HEAD WITHOUT CONTRAST MRA HEAD WITHOUT CONTRAST MRI CERVICAL SPINE WITHOUT CONTRAST TECHNIQUE: Multiplanar, multiecho pulse sequences of the brain and surrounding structures were obtained without intravenous contrast. Angiographic images of the head were obtained using MRA technique without contrast. Multiplanar, multiecho pulse sequences of the cervical spine were obtained without intravenous contrast. COMPARISON:  Head CT 04/01/2017 FINDINGS: MRI HEAD FINDINGS Brain: There are numerous [place] patchy acute infarcts predominantly sagittally oriented in the left cerebral hemisphere involving cortex and white matter with greatest involvement of the centrum semiovale suggestive  of watershed ischemia. There are a few tiny acute infarcts in a similar distribution in the right cerebral hemisphere predominantly in the frontal and parietal lobes near the vertex. A 6 mm acute infarct is present in the genu of the corpus callosum. A chronic microhemorrhage is noted in the right thalamus. Patchy to confluent T2 hyperintensities in the cerebral white matter bilaterally are nonspecific but compatible with moderate chronic [place] vessel ischemia, advanced for age. Chronic lacunar infarcts are present in the cerebellum, bilateral basal ganglia, right internal capsule, and corpus callosum and deep cerebral white matter bilaterally. There is mild cerebral and moderate cerebellar atrophy. No mass, midline shift, or extra-axial fluid collection is seen. Mild to moderate chronic [place] vessel ischemic changes are present in the pons with a tiny chronic infarct noted. The pituitary gland is mildly prominent in size for age, measuring 8 mm in height. Vascular: Major intracranial vascular flow voids are preserved. Skull and upper cervical spine: Unremarkable bone marrow signal. Sinuses/Orbits: Unremarkable orbits. Paranasal  sinuses and mastoid air cells are clear. Other: None. MRA HEAD FINDINGS The visualized distal vertebral arteries are patent to the basilar with the left being slightly larger than the right. There is diffuse V4 segment atherosclerotic irregularity with severe stenoses bilaterally, greater on the left. AICAs appear patent proximally with a severe proximal stenosis on the right. SCA origins are patent bilaterally. The basilar artery is patent with mild irregularity but no significant stenosis. Posterior communicating arteries are not identified. PCAs are patent with a severe proximal P2 stenosis on the right and moderate to prominent branch vessel irregularity and narrowing bilaterally. The internal carotid arteries are patent from skullbase to carotid termini with advanced siphon atherosclerosis resulting in severe tandem cavernous stenoses on the right and moderate stenosis on the left. 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 The left A1 segment is patent with mild irregularity proximally but no significant stenosis and supplies the right ACA via the anterior communicating artery. There are severe A2 stenoses bilaterally. The MCAs are patent without evidence of significant M1 stenosis or proximal branch occlusion. There is moderate MCA branch vessel atherosclerotic irregularity and narrowing bilaterally. No aneurysm is identified. MRI CERVICAL SPINE FINDINGS The study is mildly motion degraded. Alignment: Normal. Vertebrae: No evidence of fracture, osseous lesion, or significant marrow edema. Cord: Normal signal and morphology. Posterior Fossa, vertebral arteries, paraspinal tissues: Cerebral atrophy and infarcts as described above. Preserved vertebral artery flow voids in the neck. Disc levels: C2-3:  Negative. C3-4: Disc bulging and uncovertebral spurring result in mild right neural foraminal stenosis without spinal stenosis. C4-5: Minimal disc bulging and uncovertebral spurring without  significant stenosis. C5-6:  Minimal disc bulging without stenosis. C6-7: Loyal central disc extrusion with slight caudal migration without stenosis or significant spinal cord mass effect. C7-T1: Papandrea right paracentral disc protrusion without significant stenosis. IMPRESSION: 1. Patchy acute infarcts in the left greater than right cerebral hemispheres with distribution suggesting watershed ischemia. 2. Age advanced chronic Kauffman vessel ischemic disease with numerous chronic infarcts as above. 3. Mild prominence of the pituitary gland for age. Consider laboratory correlation. 4. Advanced intracranial atherosclerosis as above including severe right and moderate left ICA stenoses, severe bilateral vertebral artery stenoses, and severe right P2 PCA stenosis. 5. Mild cervical disc degeneration without high-grade stenosis as above. Electronically Signed   By: Logan Bores M.D.   On: 04/01/2017 17:15    Micro Results     Recent Results (from the past 240 hour(s))  Urine  culture     Status: Abnormal   Collection Time: 04/01/17 12:16 PM  Result Value Ref Range Status   Specimen Description URINE, CATHETERIZED  Final   Special Requests Normal  Final   Culture MULTIPLE SPECIES PRESENT, SUGGEST RECOLLECTION (A)  Final   Report Status 04/02/2017 FINAL  Final  MRSA PCR Screening     Status: None   Collection Time: 04/08/17  3:14 PM  Result Value Ref Range Status   MRSA by PCR NEGATIVE NEGATIVE Final    Comment:        The GeneXpert MRSA Assay (FDA approved for NASAL specimens only), is one component of a comprehensive MRSA colonization surveillance program. It is not intended to diagnose MRSA infection nor to guide or monitor treatment for MRSA infections.     Today   Subjective    [name] [name] today has no headache,no chest abdominal pain,no new weakness tingling or numbness, feels much better     Objective   Blood pressure 111/61, pulse 73, temperature 99 F (37.2 C), temperature  source Oral, resp. rate 13, height 5\' 8"  (1.727 m), weight 63.5 kg (140 lb), SpO2 97 %.   Intake/Output Summary (Last 24 hours) at 04/09/17 0915 Last data filed at 04/09/17 0500  Gross per 24 hour  Intake             2615 ml  Output              850 ml  Net             1765 ml    Exam Awake Alert, Oriented x 3, No new F.N deficits, L sided hemiparesis Arm>Leg Normal affect [place].AT,PERRAL, L neck post op Scar site stable Supple Neck,No JVD, No cervical lymphadenopathy appriciated.  Symmetrical Chest wall movement, Good air movement bilaterally, CTAB RRR,No Gallops,Rubs or new Murmurs, No Parasternal Heave +ve B.Sounds, Abd Soft, Non tender, No organomegaly appriciated, No rebound -guarding or rigidity. No Cyanosis, Clubbing or edema, No new Rash or bruise   Data Review   CBC w Diff: Lab Results  Component Value Date   WBC 4.7 04/09/2017   HGB 11.6 (L) 04/09/2017   HCT 37.7 04/09/2017   PLT 167 04/09/2017   LYMPHOPCT 14 04/01/2017   MONOPCT 5 04/01/2017   EOSPCT 1 04/01/2017   BASOPCT 1 04/01/2017    CMP: Lab Results  Component Value Date   NA 140 04/09/2017   K 3.9 04/09/2017   CL 107 04/09/2017   CO2 26 04/09/2017   BUN 9 04/09/2017   CREATININE 0.79 04/09/2017   PROT 6.7 04/01/2017   ALBUMIN 3.7 04/01/2017   BILITOT 0.9 04/01/2017   ALKPHOS 68 04/01/2017   AST 17 04/01/2017   ALT 12 (L) 04/01/2017  . Lab Results  Component Value Date   CHOL 211 (H) 04/02/2017   HDL 69 04/02/2017   LDLCALC 130 (H) 04/02/2017   TRIG 59 04/02/2017   CHOLHDL 3.1 04/02/2017   Lab Results  Component Value Date   HGBA1C 5.7 (H) 04/02/2017     Total Time in preparing paper work, data evaluation and todays exam - 81 minutes  Lala Lund M.D on 04/09/2017 at [facility]  [phone]

## 2017-04-10 ENCOUNTER — Inpatient Hospital Stay (HOSPITAL_COMMUNITY): Payer: Medicare Other | Admitting: Occupational Therapy

## 2017-04-10 ENCOUNTER — Inpatient Hospital Stay (HOSPITAL_COMMUNITY): Payer: BLUE CROSS/BLUE SHIELD | Admitting: Speech Pathology

## 2017-04-10 ENCOUNTER — Inpatient Hospital Stay (HOSPITAL_COMMUNITY): Payer: BLUE CROSS/BLUE SHIELD | Admitting: Physical Therapy

## 2017-04-10 DIAGNOSIS — I638 Other cerebral infarction: Secondary | ICD-10-CM

## 2017-04-10 DIAGNOSIS — I10 Essential (primary) hypertension: Secondary | ICD-10-CM

## 2017-04-10 LAB — COMPREHENSIVE METABOLIC PANEL
ALK PHOS: 67 U/L (ref 38–126)
ALT: 36 U/L (ref 14–54)
AST: 25 U/L (ref 15–41)
Albumin: 3 g/dL — ABNORMAL LOW (ref 3.5–5.0)
Anion gap: 9 (ref 5–15)
BUN: 9 mg/dL (ref 6–20)
CALCIUM: 8.8 mg/dL — AB (ref 8.9–10.3)
CO2: 25 mmol/L (ref 22–32)
CREATININE: 0.7 mg/dL (ref 0.44–1.00)
Chloride: 107 mmol/L (ref 101–111)
GFR calc non Af Amer: >60 mL/min (ref >60)
Glucose, Bld: 100 mg/dL — ABNORMAL HIGH (ref 65–99)
Potassium: 3.7 mmol/L (ref 3.5–5.1)
SODIUM: 141 mmol/L (ref 135–145)
Total Bilirubin: 0.7 mg/dL (ref 0.3–1.2)
Total Protein: 6.1 g/dL — ABNORMAL LOW (ref 6.5–8.1)

## 2017-04-10 LAB — CBC WITH DIFFERENTIAL/PLATELET
Basophils Absolute: 0 10*3/uL (ref 0.0–0.1)
Basophils Relative: 1 %
EOS ABS: 0.2 10*3/uL (ref 0.0–0.7)
Eosinophils Relative: 3 %
HCT: 38.8 % (ref 36.0–46.0)
HEMOGLOBIN: 11.9 g/dL — AB (ref 12.0–15.0)
LYMPHS ABS: 1.3 10*3/uL (ref 0.7–4.0)
LYMPHS PCT: 27 %
MCH: 25.3 pg — AB (ref 26.0–34.0)
MCHC: 30.7 g/dL (ref 30.0–36.0)
MCV: 82.4 fL (ref 78.0–100.0)
Monocytes Absolute: 0.6 10*3/uL (ref 0.1–1.0)
Monocytes Relative: 12 %
NEUTROS PCT: 57 %
Neutro Abs: 2.8 10*3/uL (ref 1.7–7.7)
Platelets: 154 10*3/uL (ref 150–400)
RBC: 4.71 MIL/uL (ref 3.87–5.11)
RDW: 13.9 % (ref 11.5–15.5)
WBC: 5 10*3/uL (ref 4.0–10.5)

## 2017-04-10 NOTE — Progress Notes (Signed)
   Doing well in rehab on pod#2. Neuro at preop baseline. Will f/u on 6.20. If issues arise in rehab please contact vascular on call.   Brandon C. Donzetta Matters, MD [facility] Office: [phone] Pager: [phone]

## 2017-04-10 NOTE — Progress Notes (Signed)
[facility] PHYSICAL MEDICINE & REHABILITATION     PROGRESS NOTE    Subjective/Complaints: Didn't sleep well because bed was uncomfortable. Otherwise feeling fairly well. Denies pain  ROS: pt denies nausea, vomiting, diarrhea, cough, shortness of breath or chest pain   Objective: Vital Signs: Blood pressure (!) 155/69, pulse 80, temperature 98.7 F (37.1 C), temperature source Oral, resp. rate 20, height 5\' 6"  (1.676 m), weight 69.4 kg (153 lb), SpO2 98 %. No results found.  Recent Labs  04/09/17 0510 04/10/17 0603  WBC 4.7 5.0  HGB 11.6* 11.9*  HCT 37.7 38.8  PLT 167 154    Recent Labs  04/09/17 0510 04/10/17 0603  NA 140 141  K 3.9 3.7  CL 107 107  GLUCOSE 94 100*  BUN 9 9  CREATININE 0.79 0.70  CALCIUM 8.7* 8.8*   CBG (last 3)   Recent Labs  04/08/17 1008  GLUCAP 102*    Wt Readings from Last 3 Encounters:  04/09/17 69.4 kg (153 lb)  04/01/17 63.5 kg (140 lb)    Physical Exam:  Constitutional: She is oriented to person, place, and time. She appears well-developed and well-nourished.  HENT:  Head: Normocephalic.  Intact left neck Incision  Eyes: EOM are normal. Right eye exhibits no discharge. Left eye exhibits no discharge.  Neck: Normal range of motion. Neck supple. No thyromegaly present.  Cardiovascular: RRR without murmur   Respiratory: normal effort. Lungs clear GI: Soft. Bowel sounds are normal. She exhibits no distension.  Musculoskeletal: She exhibits edema and tenderness.  Neurological: She is alert and oriented to person, place, and time. Displays reasonable good insight and awareness Motor: Right shoulder abduction 2-/5, elbow flex/ext 1+ to 2-/5, distally tr/5 RLE: 4/5 proximal to distal LUE/LLE: 5/5 proximal to distal DTRs symmetric  Skin:  Neck incision c/d/i  Psychiatric: She has a normal mood and affect. Her behavior is normal.   Assessment/Plan: 1. Functional and mobility deficits secondary to bilateral watershed infarcts  which require 3+ hours per day of interdisciplinary therapy in a comprehensive inpatient rehab setting. Physiatrist is providing close team supervision and 24 hour management of active medical problems listed below. Physiatrist and rehab team continue to assess barriers to discharge/monitor patient progress toward functional and medical goals.  Function:  Bathing Bathing position      Bathing parts      Bathing assist        Upper Body Dressing/Undressing Upper body dressing                    Upper body assist        Lower Body Dressing/Undressing Lower body dressing                                  Lower body assist        Toileting Toileting   Toileting steps completed by patient: Adjust clothing prior to toileting, Performs perineal hygiene Toileting steps completed by helper: Adjust clothing after toileting Toileting Assistive Devices: Grab bar or rail  Toileting assist     Transfers Chair/bed Clinical biochemist          Cognition Comprehension Comprehension assist level: Follows complex conversation/direction with extra time/assistive device  Expression Expression assist level: Expresses basic 90% of the time/requires cueing <  10% of the time.  Social Interaction Social Interaction assist level: Interacts appropriately with others - No medications needed.  Problem Solving Problem solving assist level: Solves basic 90% of the time/requires cueing < 10% of the time  Memory Memory assist level: Recognizes or recalls 90% of the time/requires cueing < 10% of the time   Medical Problem List and Plan: 1.  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  -beginning therapies today 2.  DVT Prophylaxis/Anticoagulation: Subcutaneous Lovenox. Monitor for any bleeding episodes. Venous Doppler studies negative 3. Pain Management: Oxycodone as needed 4. Mood: Provide  emotional support 5. Neuropsych: This patient is not fully capable of making decisions on her own behalf. 6. Skin/Wound Care: Routine skin checks 7. Fluids/Electrolytes/Nutrition: encourage po  -I personally reviewed the patient's labs today.  8. Left carotid stenosis. Status post left CEA 04/08/2017 9. Hypertension. Norvasc 2.5 mg daily.   -fair control at present 10. Hyperlipidemia. Lipitor   LOS (Days) 1 A FACE TO FACE EVALUATION WAS PERFORMED  [name], MD 04/10/2017 9:43 AM

## 2017-04-10 NOTE — Evaluation (Addendum)
Speech Language Pathology Assessment and Plan  Patient Details  Name: [name] [name] MRN: [id number] Date of Birth: February 23, 1946  SLP Diagnosis: Aphasia  Rehab Potential: Excellent ELOS: 7-14 days.    Today's Date: 04/10/2017 SLP Individual Time: 1654-6124 SLP Individual Time Calculation (min): 60 min   Problem List:  Patient Active Problem List   Diagnosis Date Noted  . Acute bilat watershed infarction [facility]) 04/09/2017  . CVA (cerebral vascular accident) (HCC)   . Hemiparesis affecting dominant side as late effect of stroke (HCC)   . Post-operative pain   . Status post carotid endarterectomy   . Benign essential HTN   . Stenosis of left carotid artery   . Hyperlipidemia   . Gait disturbance, post-stroke 04/02/2017  . Right arm weakness   . Slowness and poor responsiveness   . Hypertensive emergency 04/01/2017  . Hypokalemia 04/01/2017  . Hyperglycemia 04/01/2017  . Stroke-like symptoms 04/01/2017  . Stroke (cerebrum) (HCC) 04/01/2017   Past Medical History:  Past Medical History:  Diagnosis Date  . Stroke [facility])    Past Surgical History:  Past Surgical History:  Procedure Laterality Date  . ENDARTERECTOMY Left 04/08/2017   Procedure: ENDARTERECTOMY CAROTID;  Surgeon: Maeola Harman, MD;  Location: [facility] OR;  Service: Vascular;  Laterality: Left;  . PATCH ANGIOPLASTY Left 04/08/2017   Procedure: PATCH ANGIOPLASTY Left Carotid;  Surgeon: Maeola Harman, MD;  Location: [facility] OR;  Service: Vascular;  Laterality: Left;    Assessment / Plan / Recommendation Clinical Impression: [name] [name] a 71 y.o.right handed femaleon no prescription medications. Per chart review and daughter, patient independent prior to admission living alone. She does drive. One level home with 4 steps to entry.Family inarea question assistance on discharge.Presented 04/01/2017 with right-sided weakness and unsteady gait 2 days. CT/MRI reviewed, showing bilateral [place] infarcts.   Per report, patchy acute infarcts in the left greater than right cerebral hemispheres with distribution suggesting watershed ischemia. Advanced [place] vessel ischemic changes. MRA with atherosclerotic type changes including severe right and moderate left ICA stenosis, severe bilateral vertebral artery stenosis.Carotid Doppler showed 1-39% right ICA stenosis and greater than 80% left ICA stenosis. MRI cervical spine with mild cervical disc degeneration. MRI of the chest negative. Patient did not receive TPA. Echocardiogram with ejection fraction of 70% grade 1 diastolic dysfunction. There was plan for TEE and possible loop recorder placement however patient declined procedure at this time and plan to follow-up outpatient to discuss further options. Neurology consulted presently on aspirin and Plavix for CVA prophylaxis. Subcutaneous Lovenox for DVT prophylaxis. Follow-up vascular surgery for carotid stenosis and underwent left carotid endarterectomy 04/08/2017 per Dr. Lemar Livings. Tolerating a regular diet.     Skilled Therapeutic Interventions        Pt was seen for a Clinical Swallow Assessment demonstrating adequate larngeal elevation and timely swallow trigger across textures. No oral residue noted with any consistency. No overt s/s of aspiration with any of the tested consistencies. Pt was observed taking pills with liquid and should take them one at a time. The [place] Aphasia Screening Test was administered showing deficits in higher level auditory processing. Expressive language was within normal limits, but there were deficits in [employer], primarily in following written instructions and reading comprehension. Pt would like to return to work part-time and continue living independently. Due to this, it is recommended that pt receive Speech Therapy to address goals of care.     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 )  Function:  Eating Eating   Modified Consistency Diet: No Eating Assist Level: Assistive Device           Cognition Comprehension Comprehension assist level: Follows basic conversation/direction with extra time/assistive device  Expression   Expression assist level: Expresses basic 90% of the time/requires cueing < 10% of the time.  Social Interaction Social Interaction assist level: Interacts appropriately with others - No medications needed.  Problem Solving Problem solving assist level: Solves basic 90% of the time/requires cueing < 10% of the time  Memory Memory assist level: Recognizes or recalls 90% of the time/requires cueing < 10% of the time   Short Term Goals: Week 1: SLP Short Term Goal 1 (Week 1): short term goals equal long term goals.   Refer to Care Plan for Long Term Goals  Recommendations for other services: None   Discharge Criteria: Patient will be discharged from SLP if patient refuses treatment 3 consecutive times without medical reason, if treatment goals not met, if there is a change in medical status, if patient makes no progress towards goals or if patient is discharged from hospital.  The above assessment, treatment plan, treatment alternatives and goals were discussed and mutually agreed upon: by patient  [name] 04/10/2017, 1:28 PM

## 2017-04-10 NOTE — Evaluation (Signed)
Occupational Therapy Assessment and Plan  Patient Details  Name: [name] [name] MRN: [id number] Date of Birth: [birth year]  OT Diagnosis: hemiplegia affecting dominant side Rehab Potential: Rehab Potential (ACUTE ONLY): Good ELOS: 10-14 days    Today's Date: 04/10/2017 OT Individual Time: 3428-7681 OT Individual Time Calculation (min): 74 min     Problem List:  Patient Active Problem List   Diagnosis Date Noted  . Acute bilat watershed infarction [facility]) 04/09/2017  . CVA (cerebral vascular accident) ([place])   . Hemiparesis affecting dominant side as late effect of stroke ([place])   . Post-operative pain   . Status post carotid endarterectomy   . Benign essential HTN   . Stenosis of left carotid artery   . Hyperlipidemia   . Gait disturbance, post-stroke 04/02/2017  . Right arm weakness   . Slowness and poor responsiveness   . Hypertensive emergency 04/01/2017  . Hypokalemia 04/01/2017  . Hyperglycemia 04/01/2017  . Stroke-like symptoms 04/01/2017  . Stroke (cerebrum) ([place]) 04/01/2017    Past Medical History:  Past Medical History:  Diagnosis Date  . Stroke [facility])    Past Surgical History:  Past Surgical History:  Procedure Laterality Date  . ENDARTERECTOMY Left 04/08/2017   Procedure: ENDARTERECTOMY CAROTID;  Surgeon: Waynetta Sandy, MD;  Location: [place];  Service: Vascular;  Laterality: Left;  . PATCH ANGIOPLASTY Left 04/08/2017   Procedure: PATCH ANGIOPLASTY Left Carotid;  Surgeon: Waynetta Sandy, MD;  Location: [facility] OR;  Service: Vascular;  Laterality: Left;    Assessment & Plan Clinical Impression: Patient is a 71 y.o. year old female with recent admission to the hospital on 04/01/2017. Per chart review and daughter, patient independent prior to admission living alone. She does drive. One level home with 4 steps to entry.Family inarea question assistance on discharge.Presented 04/01/2017 with right-sided weakness and unsteady gait 2 days. CT/MRI  reviewed, showing bilateral [place] infarcts.  Per report, patchy acute infarcts in the left greater than right cerebral hemispheres with distribution suggesting watershed ischemia. Advanced [place] vessel ischemic changes. MRA with atherosclerotic type changes including severe right and moderate left ICA stenosis, severe bilateral vertebral artery stenosis.Carotid Doppler showed 1-39% right ICA stenosis and greater than 80% left ICA stenosis. MRI cervical spine with mild cervical disc degeneration. MRI of the chest negative. Patient did not receive TPA. Echocardiogram with ejection fraction of 15% grade 1 diastolic dysfunction. There was plan for TEE and possible loop recorder placement however patient declined procedure at this time and plan to follow-up outpatient to discuss further options. Neurology consulted presently on aspirin and Plavix for CVA prophylaxis. Subcutaneous Lovenox for DVT prophylaxis. Follow-up vascular surgery for carotid stenosis and underwent left carotid endarterectomy 04/08/2017 per Dr. Servando Snare. Tolerating a regular diet. Physical and occupational therapy evaluations completed with recommendations of physical medicine rehabilitation consult. Patient was admitted for comprehensive rehabilitation program. Patient transferred to CIR on 04/09/2017 .    Patient currently requires maxA with basic self-care skills secondary to unbalanced muscle activation, motor apraxia, decreased coordination and decreased motor planning, R inattention, and decreased initiation, decreased awareness, decreased problem solving and delayed processing.  Prior to hospitalization, patient could complete self care ADLs with independent .  Patient will benefit from skilled intervention to decrease level of assist with basic self-care skills, increase independence with basic self-care skills and increase level of independence with iADL prior to discharge home with care partner.  Anticipate patient will require 24  hour supervision and follow up outpatient.  OT - End of Session  Activity Tolerance: Tolerates 30+ min activity with multiple rests Endurance Deficit: Yes Endurance Deficit Description: requires rest breaks secondary to fatigue  OT Assessment Rehab Potential (ACUTE ONLY): Good OT Patient demonstrates impairments in the following area(s): Balance;Cognition;Endurance;Motor;Perception;Safety;Sensory;Vision OT Basic ADL's Functional Problem(s): Grooming;Bathing;Dressing;Toileting OT Advanced ADL's Functional Problem(s): Simple Meal Preparation OT Transfers Functional Problem(s): Toilet;Tub/Shower OT Additional Impairment(s): Fuctional Use of Upper Extremity OT Plan OT Intensity: Minimum of 1-2 x/day, 45 to 90 minutes OT Frequency: 5 out of 7 days OT Duration/Estimated Length of Stay: 10-14 days  OT Treatment/Interventions: Balance/vestibular training;Cognitive remediation/compensation;Discharge planning;Disease mangement/prevention;Functional mobility training;DME/adaptive equipment instruction;Therapeutic Activities;Self Care/advanced ADL retraining;Patient/family education;Psychosocial support;UE/LE Strength taining/ROM;Visual/perceptual remediation/compensation;UE/LE Coordination activities;Therapeutic Exercise OT Self Feeding Anticipated Outcome(s): independent OT Basic Self-Care Anticipated Outcome(s): supervision OT Toileting Anticipated Outcome(s): supervision OT Bathroom Transfers Anticipated Outcome(s): supervision OT Recommendation Patient destination: Home Follow Up Recommendations: Outpatient OT;24 hour supervision/assistance (vs HH OT ) Equipment Recommended: 3 in 1 bedside comode;Tub/shower bench   Skilled Therapeutic Intervention OT evaluation completed with discussion regarding OT purpose, rehab process, goals and plan of care during stay. ADL assessment completed with focus on UB/LB bathing, UB/LB dressing, toileting, and grooming ADLs. Pt able to complete room level  functional mobility with ModA and sit to stand transfers throughout session with MaxA with increased assist to control rise/steady and descent. Pt with noted R inattention during task completion and requiring increased assist to completing UB ADL tasks secondary to R UE deficits and apraxia. Pt with noted attempts to initiate use of R UE during functional task but requires assist to follow through. Pt completed bathing in shower, demonstrates ability to use RUE to wash LUE with HOH assist. Pt completed UB/LB dressing sitting EOB with increased assist for threading UEs and placing head through overhead nightgown. Pt completed seated and standing grooming ADLs at sink with ModA to steady during standing. Ended session with Pt sitting up in recliner, call bell and needs within reach.      OT Evaluation Precautions/Restrictions  Precautions Precautions: Fall Restrictions Weight Bearing Restrictions: No General Chart Reviewed: Yes Vital Signs Therapy Vitals Temp: 98.6 F (37 C) Temp Source: Oral Pulse Rate: 72 Resp: 20 BP: (!) 154/57 Patient Position (if appropriate): Sitting Oxygen Therapy SpO2: 98 % O2 Device: Not Delivered Pain Pain Assessment Pain Assessment: Faces Faces Pain Scale: No hurt Home Living/Prior Functioning Home Living Living Arrangements: Alone Available Help at Discharge: Family, Available PRN/intermittently Type of Home: House Home Access: Stairs to enter Technical brewer of Steps: 4 Entrance Stairs-Rails: None Home Layout: One level Bathroom Shower/Tub: Chiropodist: Standard  Lives With: Alone IADL History Education: 12th grade education 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  Leisure and Hobbies: Reading  Prior Function Level of Independence: Independent with basic ADLs, Independent with homemaking with ambulation, Independent with transfers, Independent with gait Vocation: Part time  employment ADL ADL ADL Comments: See Environmental health practitioner  Vision Baseline Vision/History: Wears glasses Wears Glasses: Distance only Patient Visual Report: No change from baseline Vision Assessment?: Yes Eye Alignment: Within Functional Limits Ocular Range of Motion: Other (comment) (to be tested further ) Perception  Perception:  (R inattention noted) Praxis Praxis: Impaired Praxis Impairment Details: Motor planning Praxis-Other Comments: Diffiulty following commands, performing necessary movements to ocmplete a task  Cognition Overall Cognitive Status: Impaired/Different from baseline Arousal/Alertness: Awake/alert Orientation Level: Person;Place;Situation Person: Oriented Place: Oriented Situation: Oriented Year: 2018 Month: May Day of Week: Correct Memory: Impaired Memory Impairment: Storage deficit;Decreased recall of new information  Immediate Memory Recall: Sock;Blue;Bed Memory Recall: Sock;Blue;Bed Memory Recall Sock: Without Cue Memory Recall Blue: Without Cue Memory Recall Bed: Without Cue Attention: Alternating Sustained Attention: Appears intact Selective Attention: Impaired Selective Attention Impairment: Verbal basic;Functional basic Divided Attention: Impaired Divided Attention Impairment: Verbal basic;Functional basic Awareness: Appears intact Problem Solving: Impaired Problem Solving Impairment: Verbal complex;Functional complex Executive Function: Initiating;Decision Making;Reasoning Reasoning: Impaired Reasoning Impairment: Verbal complex;Functional complex Sequencing: Impaired Sequencing Impairment: Verbal complex;Functional complex Organizing: Impaired Organizing Impairment: Functional basic;Verbal basic Decision Making: Impaired Decision Making Impairment: Verbal complex;Functional complex Initiating: Impaired Initiating Impairment: Verbal basic;Functional basic Safety/Judgment: Appears intact Sensation Coordination Gross Motor Movements  are Fluid and Coordinated: No Fine Motor Movements are Fluid and Coordinated: No Coordination and Movement Description: in RUE  Motor  Motor Motor: Hemiplegia Motor - Skilled Clinical Observations: Posterior pelvic tilt noted during sitting and standing tasks  Mobility  Transfers Transfers: Sit to Stand;Stand to Sit Sit to Stand: 2: Max assist Sit to Stand Details: Tactile cues for initiation;Tactile cues for weight shifting;Tactile cues for posture;Tactile cues for placement;Verbal cues for technique;Verbal cues for precautions/safety;Manual facilitation for weight shifting;Manual facilitation for placement Sit to Stand Details (indicate cue type and reason): assist to rise and steady  Stand to Sit: 2: Max assist Stand to Sit Details (indicate cue type and reason): Tactile cues for sequencing;Tactile cues for posture;Tactile cues for placement;Verbal cues for sequencing;Verbal cues for technique;Verbal cues for precautions/safety;Manual facilitation for placement Stand to Sit Details: assist to control descent   Trunk/Postural Assessment  Cervical Assessment Cervical Assessment: Within Functional Limits Thoracic Assessment Thoracic Assessment: Within Functional Limits Postural Control Postural Control: Deficits on evaluation Trunk Control: posterior pelvic tilt noted Righting Reactions: decreased righting reaction; requires assist to steady    Balance Balance Balance Assessed: Yes Static Sitting Balance Static Sitting - Balance Support: Left upper extremity supported;Feet supported Static Sitting - Level of Assistance: 5: Stand by assistance Dynamic Sitting Balance Dynamic Sitting - Balance Support: No upper extremity supported;Feet supported Dynamic Sitting - Level of Assistance: 4: Min assist Sitting balance - Comments: when completing UB/LB dressing  Static Standing Balance Static Standing - Balance Support: Left upper extremity supported Static Standing - Level of  Assistance: 3: Mod assist Extremity/Trunk Assessment RUE Assessment RUE Assessment: Exceptions to [facility] RUE AROM (degrees) Right Shoulder Flexion: 45 Degrees Right Elbow Flexion: 90 RUE Strength RUE Overall Strength Comments: Limited AROM RUE, Pt able to utilize RUE during functional tasks with [place] assist to initiate  LUE Assessment LUE Assessment: Within Functional Limits   See Function Navigator for Current Functional Status.   Refer to Care Plan for Long Term Goals  Recommendations for other services: None    Discharge Criteria: Patient will be discharged from OT if patient refuses treatment 3 consecutive times without medical reason, if treatment goals not met, if there is a change in medical status, if patient makes no progress towards goals or if patient is discharged from hospital.  The above assessment, treatment plan, treatment alternatives and goals were discussed and mutually agreed upon: by patient   Lou Cal, OT Pager [phone] 04/10/2017   Raymondo Band 04/10/2017, 5:44 PM

## 2017-04-10 NOTE — Evaluation (Signed)
Physical Therapy Assessment and Plan  Patient Details  Name: [name] [name] MRN: [id number] Date of Birth: October 09, 1946  PT Diagnosis: Abnormal posture, Abnormality of gait, Cognitive deficits, Difficulty walking, Hemiparesis dominant, Impaired cognition, Impaired sensation and Muscle weakness Rehab Potential: Excellent ELOS:  (14 to 21 days)   Today's Date: 04/10/2017 PT Individual Time: 7341-9379 PT Individual Time Calculation (min): 70 min    Problem List:  Patient Active Problem List   Diagnosis Date Noted  . Acute bilat watershed infarction [facility]) 04/09/2017  . CVA (cerebral vascular accident) ([place])   . Hemiparesis affecting dominant side as late effect of stroke ([place])   . Post-operative pain   . Status post carotid endarterectomy   . Benign essential HTN   . Stenosis of left carotid artery   . Hyperlipidemia   . Gait disturbance, post-stroke 04/02/2017  . Right arm weakness   . Slowness and poor responsiveness   . Hypertensive emergency 04/01/2017  . Hypokalemia 04/01/2017  . Hyperglycemia 04/01/2017  . Stroke-like symptoms 04/01/2017  . Stroke (cerebrum) ([place]) 04/01/2017    Past Medical History:  Past Medical History:  Diagnosis Date  . Stroke [facility])    Past Surgical History:  Past Surgical History:  Procedure Laterality Date  . ENDARTERECTOMY Left 04/08/2017   Procedure: ENDARTERECTOMY CAROTID;  Surgeon: Waynetta Sandy, MD;  Location: [place];  Service: Vascular;  Laterality: Left;  . PATCH ANGIOPLASTY Left 04/08/2017   Procedure: PATCH ANGIOPLASTY Left Carotid;  Surgeon: Waynetta Sandy, MD;  Location: [facility] OR;  Service: Vascular;  Laterality: Left;    Assessment & Plan Clinical Impression: Patient is a 71 y.o. year old female with recent admission to the hospital on 04-01-17 with right-sided weakness and unsteady gait 2 days. CT/MRI reviewed, showing bilateral Lo[place] infarcts.  Per report, patchy acute infarcts in the left greater than right cerebral  hemispheres with distribution suggesting watershed ischemia. Advanced [place] vessel ischemic changes. MRA with atherosclerotic type changes including severe right and moderate left ICA stenosis, severe bilateral vertebral artery stenosis.Carotid Doppler showed 1-39% right ICA stenosis and greater than 80% left ICA stenosis. MRI cervical spine with mild cervical disc degeneration. MRI of the chest negative. Patient did not receive TPA. Echocardiogram with ejection fraction of 02% grade 1 diastolic dysfunction. There was plan for TEE and possible loop recorder placement however patient declined procedure at this time and plan to follow-up outpatient to discuss further options. Neurology consulted presently on aspirin and Plavix for CVA prophylaxis. Subcutaneous Lovenox for DVT prophylaxis. Follow-up vascular surgery for carotid stenosis and underwent left carotid endarterectomy 04/08/2017 per Dr. Servando Snare. Tolerating a regular diet. Physical and occupational therapy evaluations completed with recommendations of physical medicine rehabilitation consult. Patient was admitted for comprehensive rehabilitation program.  Patient transferred to CIR on 04/09/2017 .   Patient currently requires mod with mobility secondary to muscle weakness, decreased cardiorespiratoy endurance, impaired timing and sequencing, decreased coordination and decreased motor planning, decreased problem solving, decreased memory and delayed processing and decreased sitting balance, decreased standing balance, decreased postural control, hemiplegia and decreased balance strategies.  Prior to hospitalization, patient was independent  with mobility and lived with Alone in a House home.  Home access is  (4)Stairs to enter.  Patient will benefit from skilled PT intervention to maximize safe functional mobility, minimize fall risk and decrease caregiver burden for planned discharge home with intermittent assist.  Anticipate patient will benefit from  follow up [facility] at discharge.  PT - End of Session Activity Tolerance: Tolerates  30+ min activity with multiple rests Endurance Deficit: Yes Endurance Deficit Description:  (Fatigues Easily) PT Assessment Rehab Potential (ACUTE/IP ONLY): Excellent Barriers to Discharge: Decreased caregiver support Barriers to Discharge Comments:  (Lives only; intermittent assist only) PT Patient demonstrates impairments in the following area(s): Balance;Endurance;Motor;Safety;Sensory PT Transfers Functional Problem(s): Bed Mobility;Bed to Chair;Car;Furniture PT Locomotion Functional Problem(s): Ambulation;Wheelchair Mobility;Stairs PT Plan PT Intensity: Minimum of 1-2 x/day ,45 to 90 minutes PT Frequency: 5 out of 7 days PT Duration Estimated Length of Stay:  (14 to 21 days) PT Treatment/Interventions: Ambulation/gait training;Balance/vestibular training;Cognitive remediation/compensation;Community reintegration;Discharge planning;Disease management/prevention;DME/adaptive equipment instruction;Functional mobility training;Neuromuscular re-education;Pain management;Patient/family education;Psychosocial support;Splinting/orthotics;Stair training;Therapeutic Activities;Therapeutic Exercise;UE/LE Strength taining/ROM;UE/LE Coordination activities;Wheelchair propulsion/positioning PT Transfers Anticipated Outcome(s):  (Mod I but may require supervision due to cognitive deficits) PT Locomotion Anticipated Outcome(s):  (Mod I but may require supervision due to cognition) PT Recommendation Recommendations for Other Services: Therapeutic Recreation consult Follow Up Recommendations: Home health PT;Outpatient PT Patient destination: Home Equipment Recommended: Rolling walker with 5" wheels Equipment Details:  (Possible AFO but unlikely)  Skilled Therapeutic Intervention Session focused on pt education on safe use of wheelchair, transfer techniques, safety with transfers, and gait.  Pt ambulated with RW with min  assist with device for hand on R side.  Pt requires significant cueing for trunk flexion during sit to stand for which she requires moderate assistance.  Following session, pt left up in recliner with call bell and phone in reach.  PT Evaluation Precautions/Restrictions Precautions Precautions: Fall Precaution Comments:  (R hemiparesis; L carotid endarterectomy on 04-08-17) Restrictions Weight Bearing Restrictions: No General Chart Reviewed: Yes Response to Previous Treatment: Patient with no complaints from previous session.;Patient reporting fatigue but able to participate. 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 ) Vision/Perception  Perception Perception:  (Mild R inattention but no neglect noted) Praxis Praxis: Impaired Praxis Impairment Details: Motor planning Praxis-Other Comments:  (Has difficulty following commands for manual muscle testing)  Cognition Overall Cognitive Status: Impaired/Different from baseline Arousal/Alertness: Awake/alert Orientation Level: Oriented X4 Attention: Divided Sustained Attention: Impaired Divided Attention: Impaired Memory: Impaired Memory Impairment: Storage deficit;Decreased recall of new information Awareness: Appears intact Problem Solving: Impaired Problem Solving Impairment: Functional complex Executive Function:  (Poor clock drawing with mini-Cog) Reasoning: Impaired Reasoning Impairment: Verbal complex Sequencing:  Impaired Sequencing Impairment: Verbal complex;Functional complex Organizing: Impaired Safety/Judgment: Appears intact Sensation Sensation Light Touch: Impaired by gross assessment (Decreased in R middle and fifth finger; otherwise WFL in BUE/LE dermatomes) Coordination Gross Motor Movements are Fluid and Coordinated: No Fine Motor Movements are Fluid and Coordinated: No (Decreased ability to utilize R hand for fine motor skills) Coordination and Movement Description:  (Decreased ability to accurately place R foot for stair performance) Motor  Motor Motor: Hemiplegia Motor - Skilled Clinical Observations: Has difficulty with shifting weight anteriorly to initiate sit to stand (Mild posterior lean with sitting edge of bed and during standing.  )     Trunk/Postural Assessment  Cervical Assessment Cervical Assessment: Within Functional Limits Thoracic Assessment Thoracic Assessment: Within Functional Limits Postural Control Postural Control: Deficits on evaluation Righting Reactions:  (Poor awareness  of upright neutral posture)  Balance Balance Balance Assessed: Yes Static Sitting Balance Static Sitting - Balance Support: Left upper extremity supported;Feet supported Dynamic Sitting Balance Sitting balance - Comments:  (Requires CGA when moving towards sitting on EOB.) Static Standing Balance Static Standing - Balance Support: Bilateral upper extremity supported (Posterior lean) Extremity Assessment  RUE Assessment RUE Assessment: Exceptions to [facility] RUE Strength RUE Overall Strength Comments: R shoulder flexion/abduction: 2/5; R elbow flexion/extension: 3/5; R wrist ext: 0/5;  No tone noted in R UE. LUE Assessment LUE Assessment: Within Functional Limits RLE Assessment RLE Assessment: Exceptions to [facility] RLE Strength RLE Overall Strength Comments: R knee extension: 3-/5; DF: 2-/5; Eversion: 1/5 LLE Assessment LLE Assessment: Within Functional Limits   See Function Navigator  for Current Functional Status.   Refer to Care Plan for Long Term Goals  Recommendations for other services: Therapeutic Recreation  Pet therapy, Kitchen group, Stress management and Outing/community reintegration  Discharge Criteria: Patient will be discharged from PT if patient refuses treatment 3 consecutive times without medical reason, if treatment goals not met, if there is a change in medical status, if patient makes no progress towards goals or if patient is discharged from hospital.  The above assessment, treatment plan, treatment alternatives and goals were discussed and mutually agreed upon: by patient  Robena Ewy Hilario Quarry 04/10/2017, 11:57 AM

## 2017-04-11 ENCOUNTER — Inpatient Hospital Stay (HOSPITAL_COMMUNITY): Payer: Medicare Other | Admitting: Speech Pathology

## 2017-04-11 ENCOUNTER — Inpatient Hospital Stay (HOSPITAL_COMMUNITY): Payer: Medicare Other | Admitting: Physical Therapy

## 2017-04-11 ENCOUNTER — Inpatient Hospital Stay (HOSPITAL_COMMUNITY): Payer: BLUE CROSS/BLUE SHIELD | Admitting: Occupational Therapy

## 2017-04-11 NOTE — Progress Notes (Signed)
Occupational Therapy Session Note  Patient Details  Name: [name] [name] MRN: [id number] Date of Birth: [birth year]  Today's Date: 04/11/2017 OT Individual Time: 1000-1100 OT Individual Time Calculation (min): 60 min    Short Term Goals: Week 1:  OT Short Term Goal 1 (Week 1): Pt will complete ADL self care tasks with no more than 3 cues for attending to R side.  OT Short Term Goal 2 (Week 1): Pt will complete functional mobility transfers to chair/toilet with MinA  Skilled Therapeutic Interventions/Progress Updates:    Tx focus on R UE NMR, Rt attention, safety awareness, and functional ambulation with device.   Pt greeted in w/c at time of arrival, agreeable to shower. Sit<stand with Mod A, instructional cues for strapping R hand onto walker. Pt ambulated with RW to shower bench with Min A. Bathing completed with active assist from L UE during item retrieval. Pt washing L UE with wash cloth draped over right forearm. OT facilitated weightbearing through R UE when standing with grab bars. Toilet transfer/toileting completed with instruction for walker mgt and safety. Pt then dressed at EOB. Mod A for dressing completion overall with instruction on hemi techniques  Mod A for completion of grooming tasks with max cues for using R UE as stabilizer. Pt left in recliner with all needs within reach at time of departure. [name] present to visit.    Therapy Documentation Precautions:  Precautions Precautions: Fall Precaution Comments:  (R hemiparesis; L carotid endarterectomy on 04-08-17) Restrictions Weight Bearing Restrictions: No  Pain: No c/o pain during tx    ADL: ADL ADL Comments: See Functional Navigator  Vision      See Function Navigator for Current Functional Status.   Therapy/Group: Individual Therapy  [name] 04/11/2017, 12:13 PM

## 2017-04-11 NOTE — Progress Notes (Signed)
[facility] PHYSICAL MEDICINE & REHABILITATION     PROGRESS NOTE    Subjective/Complaints: Didn't sleep well because bed was uncomfortable. Otherwise feeling fairly well. Denies pain  ROS: pt denies nausea, vomiting, diarrhea, cough, shortness of breath or chest pain   Objective: Vital Signs: Blood pressure (!) 163/68, pulse 77, temperature 98.4 F (36.9 C), temperature source Oral, resp. rate 18, height 5\' 6"  (1.676 m), weight 69.4 kg (153 lb), SpO2 100 %. No results found.  Recent Labs  04/09/17 0510 04/10/17 0603  WBC 4.7 5.0  HGB 11.6* 11.9*  HCT 37.7 38.8  PLT 167 154    Recent Labs  04/09/17 0510 04/10/17 0603  NA 140 141  K 3.9 3.7  CL 107 107  GLUCOSE 94 100*  BUN 9 9  CREATININE 0.79 0.70  CALCIUM 8.7* 8.8*   CBG (last 3)  No results for input(s): GLUCAP in the last 72 hours.  Wt Readings from Last 3 Encounters:  04/09/17 69.4 kg (153 lb)  04/01/17 63.5 kg (140 lb)    Physical Exam:  Constitutional: She is oriented to person, place, and time. She appears well-developed and well-nourished.  HENT:  Head: Normocephalic.  Intact left neck Incision  Eyes: EOM are normal. Right eye exhibits no discharge. Left eye exhibits no discharge.  Neck: Normal range of motion. Neck supple. No thyromegaly present.  Cardiovascular: RRR   Respiratory: lungs cta  GI: Soft. Bowel sounds are normal. She exhibits no distension.  Musculoskeletal: She exhibits edema and tenderness.  Neurological: She is alert and oriented to person, place, and time. Displays reasonable good insight and awareness Motor: Right shoulder abduction 2-/5, elbow flex/ext   2-/5, distally tr/5 RLE: 4/5 proximal to distal LUE/LLE: 5/5 proximal to distal---stable DTRs symmetric  Skin:  Neck incision c/d/i  Psychiatric: She has a normal mood and affect. Her behavior is normal.   Assessment/Plan: 1. Functional and mobility deficits secondary to bilateral watershed infarcts which require 3+  hours per day of interdisciplinary therapy in a comprehensive inpatient rehab setting. Physiatrist is providing close team supervision and 24 hour management of active medical problems listed below. Physiatrist and rehab team continue to assess barriers to discharge/monitor patient progress toward functional and medical goals.  Function:  Bathing Bathing position   Position: Shower  Bathing parts Body parts bathed by patient: Right arm, Chest, Front perineal area, Right upper leg, Left upper leg, Abdomen Body parts bathed by helper: Left arm, Buttocks, Right lower leg, Left lower leg, Back  Bathing assist Assist Level: Touching or steadying assistance(Pt > 75%)      Upper Body Dressing/Undressing Upper body dressing   What is the patient wearing?: Pull over shirt/dress     Pull over shirt/dress - Perfomed by patient: Thread/unthread left sleeve Pull over shirt/dress - Perfomed by helper: Thread/unthread right sleeve, Put head through opening, Pull shirt over trunk        Upper body assist Assist Level: Touching or steadying assistance(Pt > 75%)      Lower Body Dressing/Undressing Lower body dressing   What is the patient wearing?: Underwear Underwear - Performed by patient: Thread/unthread right underwear leg, Thread/unthread left underwear leg Underwear - Performed by helper: Pull underwear up/down                          Lower body assist Assist for lower body dressing: Touching or steadying assistance (Pt > 75%)      Toileting Toileting  Toileting steps completed by patient: Performs perineal hygiene Toileting steps completed by helper: Adjust clothing prior to toileting, Adjust clothing after toileting Toileting Assistive Devices: Grab bar or rail  Toileting assist Assist level: Touching or steadying assistance (Pt.75%)   Transfers Chair/bed transfer   Chair/bed transfer method: Stand pivot Chair/bed transfer assist level: Touching or steadying  assistance (Pt > 75%) Chair/bed transfer assistive device: Armrests     Locomotion Ambulation     Max distance: room level  Assist level: Moderate assist (Pt 50 - 74%)   Wheelchair     Max wheelchair distance:  (150 ft) Assist Level: Maximal assistance (Pt 25 - 49%)  Cognition Comprehension Comprehension assist level: Understands basic 90% of the time/cues < 10% of the time  Expression Expression assist level: Expresses basic 75 - 89% of the time/requires cueing 10 - 24% of the time. Needs helper to occlude trach/needs to repeat words.  Social Interaction Social Interaction assist level: Interacts appropriately with others - No medications needed.  Problem Solving Problem solving assist level: Solves basic 50 - 74% of the time/requires cueing 25 - 49% of the time  Memory Memory assist level: Recognizes or recalls 90% of the time/requires cueing < 10% of the time   Medical Problem List and Plan: 1.  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  -continue therapies 2.  DVT Prophylaxis/Anticoagulation: Subcutaneous Lovenox. Monitor for any bleeding episodes. Venous Doppler studies negative 3. Pain Management: Oxycodone as needed--controlled at present 4. Mood: Provide emotional support 5. Neuropsych: This patient is not fully capable of making decisions on her own behalf. 6. Skin/Wound Care: Routine skin checks 7. Fluids/Electrolytes/Nutrition: encourage po    8. Left carotid stenosis. Status post left CEA 04/08/2017 9. Hypertension. Norvasc 2.5 mg daily.   -fair control at present 10. Hyperlipidemia. Lipitor   LOS (Days) 2 A FACE TO FACE EVALUATION WAS PERFORMED  [name], MD 04/11/2017 10:39 AM

## 2017-04-11 NOTE — Addendum Note (Signed)
Addendum  created 04/11/17 1503 by Less Woolsey, MD   Sign clinical note    

## 2017-04-11 NOTE — Progress Notes (Signed)
Speech Language Pathology Daily Session Note  Patient Details  Name: [name] [name] MRN: [id number] Date of Birth: February 01, 1946  Today's Date: 04/11/2017 SLP Individual Time: 0900-1000 SLP Individual Time Calculation (min): 60 min  Short Term Goals: Week 1: SLP Short Term Goal 1 (Week 1): short term goals equal long term goals.   Skilled Therapeutic Interventions: Skilled treatment session focused on cognitive goals. SLP facilitated session by providing Max A verbal cues along with initiation of a visual aid for recall of [name] [name] current medications and their functions. Patient also required Mod A verbal cues for problem solving and emergent awareness of errors during a mildly complex, novel task of organizing a BID pill box. Patient left upright in wheelchair with all needs within reach. Continue with current plan of care.      Function:  Cognition Comprehension Comprehension assist level: Understands basic 90% of the time/cues < 10% of the time  Expression   Expression assist level: Expresses basic 75 - 89% of the time/requires cueing 10 - 24% of the time. Needs helper to occlude trach/needs to repeat words.  Social Interaction Social Interaction assist level: Interacts appropriately with others - No medications needed.  Problem Solving Problem solving assist level: Solves basic 50 - 74% of the time/requires cueing 25 - 49% of the time  Memory Memory assist level: Recognizes or recalls 90% of the time/requires cueing < 10% of the time    Pain No/Denies Pain   Therapy/Group: Individual Therapy  [name] [name] 04/11/2017, 4:06 PM

## 2017-04-11 NOTE — Progress Notes (Signed)
[facility] Individual Statement of Services  Patient Name:  [name] [name]  Date:  04/11/2017  Welcome to the [facility].  Our goal is to provide you with an individualized program based on your diagnosis and situation, designed to meet your specific needs.  With this comprehensive rehabilitation program, you will be expected to participate in at least 3 hours of rehabilitation therapies Monday-Friday, with modified therapy programming on the weekends.  Your rehabilitation program will include the following services:  Physical Therapy (PT), Occupational Therapy (OT), Speech Therapy (ST), 24 hour per day rehabilitation nursing, Neuropsychology, Case Management (Social Worker), Rehabilitation Medicine, Nutrition Services and Pharmacy Services  Weekly team conferences will be held on Tuesdays to discuss your progress.  Your Social Worker will talk with you frequently to get your input and to update you on team discussions.  Team conferences with you and your family in attendance may also be held.  Expected length of stay: 10 to 21 days  Overall anticipated outcome:  Supervision  Depending on your progress and recovery, your program may change. Your Social Worker will coordinate services and will keep you informed of any changes. Your Social Worker's name and contact numbers are listed  below.  The following services may also be recommended but are not provided by the [facility] will be made to provide these services after discharge if needed.  Arrangements include referral to agencies that provide these services.  Your insurance has been verified to be:  [employer] Your primary doctor is:  You do not have one, but I will help you find someone for follow up care.  Pertinent  information will be shared with your doctor and your insurance company.  Social Worker:  Alfonse Alpers, LCSW  [phone] or ([phone]  Information discussed with and copy given to patient by: Trey Sailors, 04/11/2017, 1:55 AM

## 2017-04-11 NOTE — Progress Notes (Signed)
Physical Therapy Session Note  Patient Details  Name: [name] [name] MRN: [id number] Date of Birth: 17-Nov-1945  Today's Date: 04/11/2017 PT Individual Time: 2585-2778 PT Individual Time Calculation (min): 73 min   Short Term Goals: Week 1:  PT Short Term Goal 1 (Week 1): Pt will perform sit to stand transfer wtih min assist and LRAD. PT Short Term Goal 2 (Week 1): Pt will perform mat to chair transfer with min assist with LRAD. PT Short Term Goal 3 (Week 1): Pt will ascend and descend 4 steps, one handrail, and min assist. PT Short Term Goal 4 (Week 1): Pt will ambulate 150 ft wtih supervision, LRAD.  Skilled Therapeutic Interventions/Progress Updates:  Pt received in room & agreeable to tx. Session focused on gait training, NMR, & balance testing. Pt ambulated 100 ft + 100 ft with RW & R hand orthosis with min assist fading to supervision. Transitioned to gait without AD x 100 ft + 125 ft + 200 ft with min assist with therapist providing min assist to facilitate weight shifting R. Throughout all gait pt demonstrates decreased step length BLE, decreased stride length, and decreased gait speed; pt also requires cuing for forward gaze instead of down at floor. Pt performed step ups on 6" step with LUE support & mod assist with task focusing on RLE hip/knee flexion, strengthening, balance, and NMR. Pt with improving concentric and eccentric control as task progressed, but would benefit from continued practice 2/2 decreased hip/knee flexion & balance. Pt completed Berg Balance Test & scored 31/56; educated pt on interpretation of score & current fall risk. Patient demonstrates increased fall risk as noted by score of 31/56 on Berg Balance Scale.  (<36= high risk for falls, close to 100%; 37-45 significant >80%; 46-51 moderate >50%; 52-55 lower >25%). Pt donned & doffed pillowcases with cuing & max assist to incorporate RUE into task and significant amount of time to complete task.  Also discussed current  goals set at supervision level and recommendation of supervision at home with pt reporting she has family available to provide this. During session pt demonstrates R inattention to body and space, requiring cuing to attend to R. At end of session pt left sitting in recliner in room with all needs within reach.   Therapy Documentation Precautions:  Precautions Precautions: Fall Precaution Comments:  (R hemiparesis; L carotid endarterectomy on 04-08-17) Restrictions Weight Bearing Restrictions: No  Pain: Denied c/o pain.   Balance: Balance Balance Assessed: Yes Standardized Balance Assessment Standardized Balance Assessment: Berg Balance Test Berg Balance Test Sit to Stand: Able to stand without using hands and stabilize independently Standing Unsupported: Able to stand 2 minutes with supervision Sitting with Back Unsupported but Feet Supported on Floor or Stool: Able to sit safely and securely 2 minutes Stand to Sit: Sits safely with minimal use of hands Transfers: Able to transfer with verbal cueing and /or supervision Standing Unsupported with Eyes Closed: Able to stand 10 seconds with supervision Standing Ubsupported with Feet Together: Able to place feet together independently and stand for 1 minute with supervision From Standing, Reach Forward with Outstretched Arm: Reaches forward but needs supervision (reaches 9 inches but requires supervision and max cuing for processing task) From Standing Position, Pick up Object from Floor: Able to pick up shoe, needs supervision (with LUE) From Standing Position, Turn to Look Behind Over each Shoulder: Needs supervision when turning (very limited turning & R more limited than L) Turn 360 Degrees: Needs close supervision or verbal cueing (completes 360  turn to both sides but slowly & with supervision) Standing Unsupported, Alternately Place Feet on Step/Stool: Needs assistance to keep from falling or unable to try Standing Unsupported, One  Foot in Front: Able to take Strehl step independently and hold 30 seconds (supervision for standing balance) Standing on One Leg: Unable to try or needs assist to prevent fall Total Score: 31   See Function Navigator for Current Functional Status.   Therapy/Group: Individual Therapy  Waunita Schooner 04/11/2017, 3:54 PM

## 2017-04-12 ENCOUNTER — Inpatient Hospital Stay (HOSPITAL_COMMUNITY): Payer: Medicare Other | Admitting: *Deleted

## 2017-04-12 ENCOUNTER — Inpatient Hospital Stay (HOSPITAL_COMMUNITY): Payer: BLUE CROSS/BLUE SHIELD | Admitting: Physical Therapy

## 2017-04-12 ENCOUNTER — Inpatient Hospital Stay (HOSPITAL_COMMUNITY): Payer: BLUE CROSS/BLUE SHIELD | Admitting: *Deleted

## 2017-04-12 ENCOUNTER — Inpatient Hospital Stay (HOSPITAL_COMMUNITY): Payer: BLUE CROSS/BLUE SHIELD | Admitting: Speech Pathology

## 2017-04-12 ENCOUNTER — Inpatient Hospital Stay (HOSPITAL_COMMUNITY): Payer: BLUE CROSS/BLUE SHIELD | Admitting: Occupational Therapy

## 2017-04-12 NOTE — Progress Notes (Signed)
Physical Therapy Session Note  Patient Details  Name: [name] [name] MRN: [id number] Date of Birth: Oct 16, 1946  Today's Date: 04/12/2017 PT Individual Time: 9892-1194 PT Individual Time Calculation (min): 75 min   Short Term Goals: Week 1:  PT Short Term Goal 1 (Week 1): Pt will perform sit to stand transfer wtih min assist and LRAD. PT Short Term Goal 2 (Week 1): Pt will perform mat to chair transfer with min assist with LRAD. PT Short Term Goal 3 (Week 1): Pt will ascend and descend 4 steps, one handrail, and min assist. PT Short Term Goal 4 (Week 1): Pt will ambulate 150 ft wtih supervision, LRAD.  Skilled Therapeutic Interventions/Progress Updates:    Tx focused on functional mobility training, gait without device, and NMR via forced use, manual facilitation, and multi-modal cues.Pt up in recliner, needing to toilet.   Gait in/around roomw with RW and min-guard A ithincreased time and cues for safe RW use. Pt has difficulty managing device with RUE . Pt performed sitting and standing dynamic functional balance, including retieving object from the floor with close S/min-guard A.   Gait in controlled setting wtiht R HHA to increase challenge and gait impairment awareness 2x150' and 1x50' with cues for posture and gait cycle, R heel strike to improve foot clearance  Seated NMR for RUE multi-directional motor control with ball rolling task at table x10 in frontal and sagital planes. [place] HEP instruction for fall risk reduction and functional balance training with handouts for cues and demo x10 each.   Stair training x12 with 2 rails and min A with sequencing and safety cues. 1x4 with 1 rail side-stepping for alternate home entry option, Mod A to lower due to difficult foot placement. PT demonstrated other combination options for home entry depending on device use and rails available.   Pt left up in recliner with all needs in reach.   Therapy Documentation Precautions:   Precautions Precautions: Fall Precaution Comments:  (R hemiparesis; L carotid endarterectomy on 04-08-17) Restrictions Weight Bearing Restrictions: No   Pain: Pain Assessment Pain Assessment: No/denies pain   See Function Navigator for Current Functional Status.   Therapy/Group: Individual Therapy  Malik Ruffino, Corinna Lines, PT, DPT  04/12/2017, 2:06 PM

## 2017-04-12 NOTE — Progress Notes (Signed)
Physical Therapy Session Note  Patient Details  Name: [name] [name] MRN: [id number] Date of Birth: [birth year]  Today's Date: 04/12/2017 PT Individual Time: 0900-1000 PT Individual Time Calculation (min): 60 min   Short Term Goals: Week 1:  PT Short Term Goal 1 (Week 1): Pt will perform sit to stand transfer wtih min assist and LRAD. PT Short Term Goal 2 (Week 1): Pt will perform mat to chair transfer with min assist with LRAD. PT Short Term Goal 3 (Week 1): Pt will ascend and descend 4 steps, one handrail, and min assist. PT Short Term Goal 4 (Week 1): Pt will ambulate 150 ft wtih supervision, LRAD.  Skilled Therapeutic Interventions/Progress Updates: Pt received seated in recliner, denies pain and agreeable to treatment. Sit >stand with S and RW, increased time d/t difficulty figuring out hand placement and technique with min cueing. Gait to gym with RW, L hand splint and close S. Note poor weight shift to L side and poor RLE foot clearance. Nustep x8 min with BUE/BLE and R hand splint for coordination, reciprocal stepping, aerobic endurance and strengthening. Dynamic gait in parallel bars including side stepping, backwards walking and forward steps with RLE to target for focus on increasing step length. Pt with significant difficulty increasing R step length in any direction again with poor L weight shift and resistance to mild corrective facilitation by therapist indicating mild pusher syndrome. Standing toe taps RLE to 3" step, progressed to 5" and 7" step with repetition for focus on L weight shift and R foot clearance. Therapist again facilitating L weight shift with improving pt initiation into weight shift with practice. Gait to return to room with RW selected by patient due to fatigue, S overall with improved foot clearance and midline alignment. Remained seated in restroom at end of session with instruction to use call bell when finished.      Therapy Documentation Precautions:   Precautions Precautions: Fall Precaution Comments:  (R hemiparesis; L carotid endarterectomy on 04-08-17) Restrictions Weight Bearing Restrictions: No   See Function Navigator for Current Functional Status.   Therapy/Group: Individual Therapy  Luberta Mutter 04/12/2017, 9:56 AM

## 2017-04-12 NOTE — Progress Notes (Signed)
[place] PHYSICAL MEDICINE & REHABILITATION     PROGRESS NOTE    Subjective/Complaints: In good spirits. Slept reasonably well  ROS: pt denies nausea, vomiting, diarrhea, cough, shortness of breath or chest pain    Objective: Vital Signs: Blood pressure (!) 147/62, pulse 77, temperature 98.6 F (37 C), temperature source Oral, resp. rate 18, height 5\' 6"  (1.676 m), weight 69.4 kg (153 lb), SpO2 100 %. No results found.  Recent Labs  04/10/17 0603  WBC 5.0  HGB 11.9*  HCT 38.8  PLT 154    Recent Labs  04/10/17 0603  NA 141  K 3.7  CL 107  GLUCOSE 100*  BUN 9  CREATININE 0.70  CALCIUM 8.8*   CBG (last 3)  No results for input(s): GLUCAP in the last 72 hours.  Wt Readings from Last 3 Encounters:  04/09/17 69.4 kg (153 lb)  04/01/17 63.5 kg (140 lb)    Physical Exam:  Constitutional: She is oriented to person, place, and time. She appears well-developed and well-nourished.  HENT:  Head: Normocephalic.  Intact left neck Incision  Eyes: EOM are normal. Right eye exhibits no discharge. Left eye exhibits no discharge.  Neck: Normal range of motion. Neck supple. No thyromegaly present.  Cardiovascular: RRR   Respiratory: CTA bilaterally   GI: Soft. Bowel sounds are normal. She exhibits no distension.  Musculoskeletal: She exhibits edema and tenderness.  Neurological: She is alert and oriented to person, place, and time. Displays reasonable good insight and awareness Motor: Right shoulder abduction 2-/5, elbow flex/ext   2-/5, distally tr/5 RLE: 4/5 proximal to distal LUE/LLE: 5/5 proximal to distal---stable DTRs symmetric  Skin:  Neck incision c/d/i  Psychiatric: She has a normal mood and affect. Her behavior is normal.   Assessment/Plan: 1. 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 Physiatrist is providing close team  supervision and 24 hour management of active medical problems listed below. Physiatrist and rehab team continue to assess barriers to discharge/monitor patient progress toward functional and medical goals.  Function:  Bathing Bathing position   Position: Shower  Bathing parts Body parts bathed by patient: Right arm, Chest, Front perineal area, Right upper leg, Left upper leg, Abdomen, Right lower leg, Left lower leg Body parts bathed by helper: Buttocks, Back  Bathing assist Assist Level: Touching or steadying assistance(Pt > 75%)      Upper Body Dressing/Undressing Upper body dressing   What is the patient wearing?: Button up shirt     Pull over shirt/dress - Perfomed by patient: Thread/unthread left sleeve Pull over shirt/dress - Perfomed by helper: Thread/unthread right sleeve, Put head through opening, Pull shirt over trunk Button up shirt - Perfomed by patient: Thread/unthread right sleeve, Thread/unthread left sleeve, Pull shirt around back Button up shirt - Perfomed by helper: Button/unbutton shirt    Upper body assist Assist Level: Touching or steadying assistance(Pt > 75%)      Lower Body Dressing/Undressing Lower body dressing   What is the patient wearing?: Underwear, Pants, Non-skid slipper socks Underwear - Performed by patient: Thread/unthread right underwear leg, Thread/unthread left underwear leg, Pull underwear up/down Underwear - Performed by helper: Pull underwear up/down Pants- Performed by patient: Thread/unthread left pants leg, Pull pants up/down Pants- Performed by helper: Thread/unthread right pants leg   Non-skid slipper socks- Performed by helper: Don/doff right sock, Don/doff left sock  Lower body assist Assist for lower body dressing: Touching or steadying assistance (Pt > 75%)      Toileting Toileting   Toileting steps completed by patient: Adjust clothing prior to toileting, Performs perineal hygiene Toileting steps  completed by helper: Performs perineal hygiene, Adjust clothing after toileting Toileting Assistive Devices: Grab bar or rail  Toileting assist Assist level: Touching or steadying assistance (Pt.75%)   Transfers Chair/bed transfer   Chair/bed transfer method: Stand pivot Chair/bed transfer assist level: Supervision or verbal cues Chair/bed transfer assistive device: Armrests     Locomotion Ambulation     Max distance: 200 ft Assist level: Touching or steadying assistance (Pt > 75%)   Wheelchair     Max wheelchair distance:  (150 ft) Assist Level: Maximal assistance (Pt 25 - 49%)  Cognition Comprehension Comprehension assist level: Understands basic 90% of the time/cues < 10% of the time  Expression Expression assist level: Expresses basic 90% of the time/requires cueing < 10% of the time.  Social Interaction Social Interaction assist level: Interacts appropriately with others - No medications needed.  Problem Solving Problem solving assist level: Solves basic 50 - 74% of the time/requires cueing 25 - 49% of the time  Memory Memory assist level: Recognizes or recalls 90% of the time/requires cueing < 10% of the time   Medical Problem List and Plan: 1.  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  -continue therapies--making functional gains 2.  DVT Prophylaxis/Anticoagulation: Subcutaneous Lovenox. Monitor for any bleeding episodes. Venous Doppler studies negative 3. Pain Management: Oxycodone as needed--controlled at present 4. Mood: Provide emotional support 5. Neuropsych: This patient is not fully capable of making decisions on her own behalf. 6. Skin/Wound Care: Routine skin checks 7. Fluids/Electrolytes/Nutrition: encourage po    8. Left carotid stenosis. Status post left CEA 04/08/2017 9. Hypertension. Norvasc 2.5 mg daily.   -fair control at present 10. Hyperlipidemia. Lipitor   LOS (Days) 3 A FACE TO FACE EVALUATION WAS  PERFORMED  [name], MD 04/12/2017 9:13 AM

## 2017-04-12 NOTE — Progress Notes (Signed)
Occupational Therapy Session Note  Patient Details  Name: [name] [name] MRN: [id number] Date of Birth: Feb 25, 1946  Today's Date: 04/12/2017 OT Individual Time: 8590-9311 OT Individual Time Calculation (min): 59 min    Short Term Goals: Week 1:  OT Short Term Goal 1 (Week 1): Pt will complete ADL self care tasks with no more than 3 cues for attending to R side.  OT Short Term Goal 2 (Week 1): Pt will complete functional mobility transfers to chair/toilet with MinA  Skilled Therapeutic Interventions/Progress Updates:    Tx focus on R NMR, balance, and functional mobility without AD during self care tasks.   Pt greeted EOB; breakfast had just arrived. OT facilitated use of R UE into self feeding tasks, using it as stabilizer for opening containers, eating finger foods, and to assist L UE to bring beverages to mouth. With [facility] for forming secure grasp, pt able to sustain grip and bring R UE from tray to mouth to bite/sip. Pt required max cues to reinforce learning due to tendency to to use L UE only. Afterwards pt ambulated to sink (Min A) to wash her hands, used R hand to dispense automatic soap. Attended to Rt side to retrieve paper towels without cues. Pt then ambulated to dresser to retrieve clothing. Forced use of R UE to assist with selecting clothing items. Once at EOB, pt required steady assist in standing with continued education on hemi techniques. Pt also requiring assist for donning Teds and  right sock. Pt left in recliner with all needs at time of departure.   Therapy Documentation Precautions:  Precautions Precautions: Fall Precaution Comments:  (R hemiparesis; L carotid endarterectomy on 04-08-17) Restrictions Weight Bearing Restrictions: No  Pain: No c/o pain during tx    ADL: ADL ADL Comments: See Functional Navigator     See Function Navigator for Current Functional Status.   Therapy/Group: Individual Therapy  R[place]hene Methvin A Adrienne Delay 04/12/2017, 12:25 PM

## 2017-04-12 NOTE — IPOC Note (Signed)
Overall Plan of Care [facility]) Patient Details Name: [name] [name] MRN: [id number] DOB: 07/31/1946  Admitting Diagnosis: Stroke  Hospital Problems: Active Problems:   Acute bilat watershed infarction [facility])     Functional Problem List: Nursing Bowel, Endurance, Medication Management, Motor, Nutrition, Safety, Sensory, Skin Integrity  PT Balance, Endurance, Motor, Safety, Sensory  OT Balance, Cognition, Endurance, Motor, Perception, Safety, Sensory, Vision  SLP Cognition  TR         Basic ADL's: OT Grooming, Bathing, Dressing, Toileting     Advanced  ADL's: OT Simple Meal Preparation     Transfers: PT Bed Mobility, Bed to Chair, Car, Manufacturing systems engineer, Metallurgist: PT Ambulation, Emergency planning/management officer, Stairs     Additional Impairments: OT Fuctional Use of Upper Extremity  SLP Communication comprehension, expression    TR      Anticipated Outcomes Item Anticipated Outcome  Self Feeding independent  Swallowing      Basic self-care  supervision  Toileting  supervision   Bathroom Transfers supervision  Bowel/Bladder  Manage mod I  Transfers   (Mod I but may require supervision due to cognitive deficits)  Locomotion   (Mod I but may require supervision due to cognition)  Communication  Pt will perform at baseline cognitive linguistic function with mod I   Cognition     Pain  less than 2  Safety/Judgment  no falls, skin breakdown or infection while on rehab   Therapy Plan: PT Intensity: Minimum of 1-2 x/day ,45 to 90 minutes PT Frequency: 5 out of 7 days PT Duration Estimated Length of Stay:  (14 to 21 days) OT Intensity: Minimum of 1-2 x/day, 45 to 90 minutes OT Frequency: 5 out of 7 days OT Duration/Estimated Length of Stay: 10-14 days  SLP Intensity: Minumum of 1-2 x/day, 30 to 90 minutes SLP Frequency: 3 to 5 out of 7 days SLP Duration/Estimated Length of Stay: 7-14 days       Team Interventions: Nursing Interventions  Patient/Family Education, Bowel Management, Disease Management/Prevention, Skin Care/Wound Management, Discharge Planning, Medication Management, Psychosocial Support  PT interventions Ambulation/gait training, Training and development officer, Cognitive remediation/compensation, Community reintegration, Discharge planning, Disease management/prevention, DME/adaptive equipment instruction, Functional mobility training, Neuromuscular re-education, Pain management, Patient/family education, Psychosocial support, Splinting/orthotics, Stair training, Therapeutic Activities, Therapeutic Exercise, UE/LE Strength taining/ROM, UE/LE Coordination activities, Wheelchair propulsion/positioning  OT Interventions Training and development officer, Cognitive remediation/compensation, Discharge planning, Disease mangement/prevention, Functional mobility training, DME/adaptive equipment instruction, Therapeutic Activities, Self Care/advanced ADL retraining, Patient/family education, Psychosocial support, UE/LE Strength taining/ROM, Visual/perceptual remediation/compensation, UE/LE Coordination activities, Therapeutic Exercise  SLP Interventions Medication managment, Functional tasks, Cognitive remediation/compensation  TR Interventions    SW/CM Interventions Discharge Planning, Psychosocial Support, Patient/Family Education    Team Discharge Planning: Destination: PT-Home ,OT- Home , SLP-Home Projected Follow-up: PT-Home health PT, Outpatient PT, OT-  Outpatient OT, 24 hour supervision/assistance (vs HH OT ), SLP-Home Health SLP Projected Equipment Needs: PT-Rolling walker with 5" wheels, OT- 3 in 1 bedside comode, Tub/shower bench, SLP-None recommended by SLP Equipment Details: PT- (Possible AFO but unlikely), OT-  Patient/family involved in discharge planning: PT- Patient,  OT-Patient, SLP-Patient  MD ELOS: 14-16 days Medical Rehab Prognosis:  Excellent Assessment: The patient has been admitted for CIR therapies with the  diagnosis of watershed strokes. The team will be addressing functional mobility, strength, stamina, balance, safety, adaptive techniques and equipment, self-care, bowel and bladder mgt, patient and caregiver education, NMR, visual-spatial awareness, cognition, communication, communithy reintegration. Goals have been set at mod I to supervision  with basic mobility, self-care, and cognition.    [name], MD, FAAPMR      See Team Conference Notes for weekly updates to the plan of care

## 2017-04-13 NOTE — Progress Notes (Addendum)
Social Work Assessment and Plan  Patient Details  Name: [name] [name] MRN: [id number] Date of Birth: 19-Apr-1946  Today's Date: 04/10/2017  Problem List:  Patient Active Problem List   Diagnosis Date Noted  . Acute bilat watershed infarction [facility]) 04/09/2017  . CVA (cerebral vascular accident) ([place])   . Hemiparesis affecting dominant side as late effect of stroke ([place])   . Post-operative pain   . Status post carotid endarterectomy   . Benign essential HTN   . Stenosis of left carotid artery   . Hyperlipidemia   . Gait disturbance, post-stroke 04/02/2017  . Right arm weakness   . Slowness and poor responsiveness   . Hypertensive emergency 04/01/2017  . Hypokalemia 04/01/2017  . Hyperglycemia 04/01/2017  . Stroke-like symptoms 04/01/2017  . Stroke (cerebrum) ([place]) 04/01/2017   Past Medical History:  Past Medical History:  Diagnosis Date  . Stroke [facility])    Past Surgical History:  Past Surgical History:  Procedure Laterality Date  . ENDARTERECTOMY Left 04/08/2017   Procedure: ENDARTERECTOMY CAROTID;  Surgeon: Waynetta Sandy, MD;  Location: [place];  Service: Vascular;  Laterality: Left;  . PATCH ANGIOPLASTY Left 04/08/2017   Procedure: PATCH ANGIOPLASTY Left Carotid;  Surgeon: Waynetta Sandy, MD;  Location: [place];  Service: Vascular;  Laterality: Left;   Social History:  reports that she has never smoked. She has never used smokeless tobacco. She reports that she does not drink alcohol or use drugs.  Family / Support Systems Marital Status: Single Patient Roles: Parent, Other (Comment) (employee - [employer] ) Children: Elita Quick - dtr - [phone] Other Supports: friends/co-workers Anticipated Caregiver: Daughter stated that patient would have 24/7 if she needed it, per [facility].  CSW to confirm this with dtr. Caregiver Availability: 24/7 Family Dynamics: Dtr is very supportive and concerned about pt.  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 Legal History/Current Legal Issues: none reported Guardian/Conservator: Per Dr. Naaman Plummer, pt is not fully capable of making her own decisions.   Abuse/Neglect Physical Abuse: Denies Verbal Abuse: Denies Sexual Abuse: Denies Exploitation of patient/patient's resources: Denies Self-Neglect: Denies  Emotional Status Pt's affect, behavior and adjustment status: Pt takes a while to respond sometimes to questions, but she smiled often and is motivated to rehab.  Pt reports feeling up and down emotionally, but not extreme. Recent Psychosocial Issues: none reported Psychiatric History: none reported Substance Abuse History: none reported  Patient / Family Perceptions, Expectations & Goals Pt/Family understanding of illness & functional limitations: Pt and dtr understand pt's condition and do not have any questions right now. Premorbid pt/family roles/activities: Pt was working full time and did not have a lot of time for other activities. Anticipated changes in roles/activities/participation: Pt hopes to have some hobbies/activities after she recovers and retires. Pt/family expectations/goals: Pt wants to get stronger and regain her independence.  Community Resources Express Scripts: None Premorbid Home Care/DME Agencies: None Transportation available at discharge: dtr Resource referrals recommended: Neuropsychology, Support group (specify) (Stroke Support Group)  Discharge Planning Living Arrangements: Alone Support Systems: Children, Other relatives, Friends/neighbors, Social worker community Type of Residence: Private residence Insurance Resources: Multimedia programmer (specify) (Blue Cross Blue Shield) Museum/gallery curator Resources: Employment Museum/gallery curator Screen Referred:  No Money Management: Patient Does the patient have any problems obtaining your medications?: No Home Management: Pt was taking  care of her home, inside and outside.  Pt has decided to hire someone with outside work. Patient/Family Preliminary Plans: Pt and dtr are working out plan for pt at d/c. Barriers to Discharge: Steps, Self care Social Work Anticipated Follow Up Needs: HH/OP, Support Group Expected length of stay: 14 to 21 days  Clinical Impression CSW met with pt and then called her dtr to introduce self and role of CSW, as well as to complete assessment.  Pt admitted to having up and down emotions, but overall is motivated and ready to get better.  Pt is used to being independent and the one who helps others, so it is hard to be the one needing help.  Pt's dtr needed FMLA paperwork needed for dtr who works at [employer] and requested handicap placard application, so CSW got these for pt.  Otherwise, pt/dtr have no other needs/questions/concerns at this time.  CSW will continue to follow and assist pt as needed.  Roby Donaway, Silvestre Mesi 04/12/2017, 12:34 AM

## 2017-04-13 NOTE — Progress Notes (Signed)
[place] PHYSICAL MEDICINE & REHABILITATION     PROGRESS NOTE    Subjective/Complaints: No new complaints. Feeling well. Low grade temp recorded  ROS: pt denies nausea, vomiting, diarrhea, cough, shortness of breath or chest pain    Objective: Vital Signs: Blood pressure (!) 158/62, pulse 77, temperature 99.4 F (37.4 C), temperature source Oral, resp. rate 18, height 5\' 6"  (1.676 m), weight 69.4 kg (153 lb), SpO2 100 %. No results found. No results for input(s): WBC, HGB, HCT, PLT in the last 72 hours. No results for input(s): NA, K, CL, GLUCOSE, BUN, CREATININE, CALCIUM in the last 72 hours.  Invalid input(s): CO CBG (last 3)  No results for input(s): GLUCAP in the last 72 hours.  Wt Readings from Last 3 Encounters:  04/09/17 69.4 kg (153 lb)  04/01/17 63.5 kg (140 lb)    Physical Exam:  Constitutional: She is oriented to person, place, and time. She appears well-developed and well-nourished.  HENT:  Head: Normocephalic.  Intact left neck Incision  Eyes: EOM are normal. Right eye exhibits no discharge. Left eye exhibits no discharge.  Neck: Normal range of motion. Neck supple. No thyromegaly present.  Cardiovascular: RRR   Respiratory: cta bilaterally GI: Soft. Bowel sounds are normal. She exhibits no distension.  Musculoskeletal: She exhibits edema and tenderness.  Neurological: She is alert and oriented to person, place, and time. Displays reasonable good insight and awareness Motor: Right shoulder abduction 2-/5, elbow flex/ext   2-/5, distally tr/5 RLE: 4/5 proximal to distal LUE/LLE: 5/5 proximal to distal---stable DTRs symmetric  Skin:  Neck incision c/d/i  Psychiatric: She has a normal mood and affect. Her behavior is normal.   Assessment/Plan: 1. 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 Physiatrist is providing close  team supervision and 24 hour management of active medical problems listed below. Physiatrist and rehab team continue to assess barriers to discharge/monitor patient progress toward functional and medical goals.  Function:  Bathing Bathing position   Position: Shower  Bathing parts Body parts bathed by patient: Right arm, Chest, Front perineal area, Right upper leg, Left upper leg, Abdomen, Right lower leg, Left lower leg Body parts bathed by helper: Buttocks, Back  Bathing assist Assist Level: Touching or steadying assistance(Pt > 75%)      Upper Body Dressing/Undressing Upper body dressing   What is the patient wearing?: Pull over shirt/dress, Bra Bra - Perfomed by patient: Thread/unthread right bra strap, Thread/unthread left bra strap, Hook/unhook bra (pull down sports bra)   Pull over shirt/dress - Perfomed by patient: Thread/unthread right sleeve, Thread/unthread left sleeve, Put head through opening, Pull shirt over trunk Pull over shirt/dress - Perfomed by helper: Thread/unthread right sleeve, Put head through opening, Pull shirt over trunk Button up shirt - Perfomed by patient: Thread/unthread right sleeve, Thread/unthread left sleeve, Pull shirt around back Button up shirt - Perfomed by helper: Button/unbutton shirt    Upper body assist Assist Level: Supervision or verbal cues      Lower Body Dressing/Undressing Lower body dressing   What is the patient wearing?: Underwear, Pants, Non-skid slipper socks, Ted Hose Underwear - Performed by patient: Thread/unthread right underwear leg, Thread/unthread left underwear leg, Pull underwear up/down Underwear - Performed by helper: Pull underwear up/down Pants- Performed by patient: Thread/unthread left pants leg, Pull pants up/down, Thread/unthread right pants leg Pants- Performed by helper: Thread/unthread right pants leg Non-skid slipper socks- Performed by  patient: Don/doff left sock Non-skid slipper socks- Performed by helper:  Don/doff right sock               TED Hose - Performed by helper: Don/doff right TED hose, Don/doff left TED hose  Lower body assist Assist for lower body dressing: Touching or steadying assistance (Pt > 75%)      Toileting Toileting   Toileting steps completed by patient: Adjust clothing prior to toileting, Adjust clothing after toileting, Performs perineal hygiene Toileting steps completed by helper: Adjust clothing prior to toileting, Adjust clothing after toileting Toileting Assistive Devices: Grab bar or rail  Toileting assist Assist level: Touching or steadying assistance (Pt.75%)   Transfers Chair/bed transfer   Chair/bed transfer method: Ambulatory Chair/bed transfer assist level: Supervision or verbal cues Chair/bed transfer assistive device: Armrests     Locomotion Ambulation     Max distance: 150 Assist level: Touching or steadying assistance (Pt > 75%)   Wheelchair     Max wheelchair distance:  (150 ft) Assist Level: Maximal assistance (Pt 25 - 49%)  Cognition Comprehension Comprehension assist level: Understands basic 90% of the time/cues < 10% of the time  Expression Expression assist level: Expresses basic 90% of the time/requires cueing < 10% of the time.  Social Interaction Social Interaction assist level: Interacts appropriately with others with medication or extra time (anti-anxiety, antidepressant).  Problem Solving Problem solving assist level: Solves basic 50 - 74% of the time/requires cueing 25 - 49% of the time  Memory Memory assist level: Recognizes or recalls 90% of the time/requires cueing < 10% of the time   Medical Problem List and Plan: 1.  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  -continue therapies-  2.  DVT Prophylaxis/Anticoagulation: Subcutaneous Lovenox. Monitor for any bleeding episodes. Venous Doppler studies negative 3. Pain Management: Oxycodone as needed--controlled at present 4. Mood:  Provide emotional support 5. Neuropsych: This patient is not fully capable of making decisions on her own behalf. 6. Skin/Wound Care: Routine skin checks 7. Fluids/Electrolytes/Nutrition: encourage po    8. Left carotid stenosis. Status post left CEA 04/08/2017. Incision clean 9. Hypertension. Norvasc 2.5 mg daily.   -fair control at present 10. Hyperlipidemia. Lipitor   LOS (Days) 4 A FACE TO FACE EVALUATION WAS PERFORMED  [name], MD 04/13/2017 9:20 AM

## 2017-04-14 ENCOUNTER — Inpatient Hospital Stay (HOSPITAL_COMMUNITY): Payer: BLUE CROSS/BLUE SHIELD | Admitting: Occupational Therapy

## 2017-04-14 ENCOUNTER — Inpatient Hospital Stay (HOSPITAL_COMMUNITY): Payer: BLUE CROSS/BLUE SHIELD | Admitting: Speech Pathology

## 2017-04-14 ENCOUNTER — Inpatient Hospital Stay (HOSPITAL_COMMUNITY): Payer: Medicare Other | Admitting: Speech Pathology

## 2017-04-14 ENCOUNTER — Inpatient Hospital Stay (HOSPITAL_COMMUNITY): Payer: Medicare Other | Admitting: Physical Therapy

## 2017-04-14 MED ORDER — TRAZODONE HCL 50 MG PO TABS
25.0000 mg | ORAL_TABLET | Freq: Every evening | ORAL | Status: DC | PRN
  Administered 2017-04-15 – 2017-04-17 (×3): 50 mg via ORAL
  Filled 2017-04-14 (×3): qty 1

## 2017-04-14 NOTE — Progress Notes (Signed)
[place] PHYSICAL MEDICINE & REHABILITATION     PROGRESS NOTE    Subjective/Complaints: Didn't sleep well. Otherwise no complaints. Right hand still weak  ROS: pt denies nausea, vomiting, diarrhea, cough, shortness of breath or chest pain    Objective: Vital Signs: Blood pressure (!) 160/67, pulse 88, temperature 97.4 F (36.3 C), temperature source Oral, resp. rate 18, height 5\' 6"  (1.676 m), weight 69.4 kg (153 lb), SpO2 97 %. No results found. No results for input(s): WBC, HGB, HCT, PLT in the last 72 hours. No results for input(s): NA, K, CL, GLUCOSE, BUN, CREATININE, CALCIUM in the last 72 hours.  Invalid input(s): CO CBG (last 3)  No results for input(s): GLUCAP in the last 72 hours.  Wt Readings from Last 3 Encounters:  04/09/17 69.4 kg (153 lb)  04/01/17 63.5 kg (140 lb)    Physical Exam:  Constitutional: She is oriented to person, place, and time. She appears well-developed and well-nourished.  HENT:  Head: Normocephalic.  Intact left neck Incision  Eyes: EOM are normal. Right eye exhibits no discharge. Left eye exhibits no discharge.  Neck: Normal range of motion. Neck supple. No thyromegaly present.  Cardiovascular: RRR   Respiratory: cta bilaterally GI: Soft. Bowel sounds are normal. She exhibits no distension.  Musculoskeletal: She exhibits edema and tenderness.  Neurological: She is alert and oriented to person, place, and time. Displays reasonable good insight and awareness Motor: Right shoulder abduction 2-/5, elbow flex/ext   2-/5, distally 2-/5. Good sitting balance RLE: 4/5 proximal to distal LUE/LLE: 5/5 proximal to distal---stable DTRs symmetric  Skin:  Neck incision c/d/i  Psychiatric: She has a normal mood and affect. Her behavior is normal.   Assessment/Plan: 1. 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 Physiatrist is providing close team supervision and 24 hour management of active medical problems listed below. Physiatrist and rehab team continue to assess barriers to discharge/monitor patient progress toward functional and medical goals.  Function:  Bathing Bathing position   Position: Shower  Bathing parts Body parts bathed by patient: Right arm, Chest, Front perineal area, Right upper leg, Left upper leg, Abdomen, Right lower leg, Left lower leg Body parts bathed by helper: Buttocks, Back  Bathing assist Assist Level: Touching or steadying assistance(Pt > 75%)      Upper Body Dressing/Undressing Upper body dressing   What is the patient wearing?: Pull over shirt/dress, Bra Bra - Perfomed by patient: Thread/unthread right bra strap, Thread/unthread left bra strap, Hook/unhook bra (pull down sports bra)   Pull over shirt/dress - Perfomed by patient: Thread/unthread right sleeve, Thread/unthread left sleeve, Put head through opening, Pull shirt over trunk Pull over shirt/dress - Perfomed by helper: Thread/unthread right sleeve, Put head through opening, Pull shirt over trunk Button up shirt - Perfomed by patient: Thread/unthread right sleeve, Thread/unthread left sleeve, Pull shirt around back Button up shirt - Perfomed by helper: Button/unbutton shirt    Upper body assist Assist Level: Supervision or verbal cues      Lower Body Dressing/Undressing Lower body dressing   What is the patient wearing?: Underwear, Pants, Non-skid slipper socks, Ted Hose Underwear - Performed by patient: Thread/unthread right underwear leg, Thread/unthread left underwear leg, Pull underwear up/down Underwear - Performed by helper: Pull underwear up/down Pants- Performed by patient: Thread/unthread left pants leg, Pull pants up/down, Thread/unthread right pants leg Pants- Performed by helper: Thread/unthread right pants leg Non-skid  slipper socks- Performed by patient: Don/doff left  sock Non-skid slipper socks- Performed by helper: Don/doff right sock               TED Hose - Performed by helper: Don/doff right TED hose, Don/doff left TED hose  Lower body assist Assist for lower body dressing: Touching or steadying assistance (Pt > 75%)      Toileting Toileting   Toileting steps completed by patient: Adjust clothing prior to toileting, Performs perineal hygiene, Adjust clothing after toileting Toileting steps completed by helper: Performs perineal hygiene, Adjust clothing after toileting Toileting Assistive Devices: Grab bar or rail  Toileting assist Assist level: Two helpers, Touching or steadying assistance (Pt.75%)   Transfers Chair/bed transfer   Chair/bed transfer method: Ambulatory Chair/bed transfer assist level: Supervision or verbal cues Chair/bed transfer assistive device: Armrests     Locomotion Ambulation     Max distance: 150 Assist level: Touching or steadying assistance (Pt > 75%)   Wheelchair     Max wheelchair distance:  (150 ft) Assist Level: Maximal assistance (Pt 25 - 49%)  Cognition Comprehension Comprehension assist level: Understands basic 90% of the time/cues < 10% of the time  Expression Expression assist level: Expresses basic 90% of the time/requires cueing < 10% of the time.  Social Interaction Social Interaction assist level: Interacts appropriately with others with medication or extra time (anti-anxiety, antidepressant).  Problem Solving Problem solving assist level: Solves basic 50 - 74% of the time/requires cueing 25 - 49% of the time  Memory Memory assist level: Recognizes or recalls 90% of the time/requires cueing < 10% of the time   Medical Problem List and Plan: 1.  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  -continue PT, OT, SLP  2.  DVT Prophylaxis/Anticoagulation: Subcutaneous Lovenox. Monitor for any bleeding episodes. Venous Doppler studies negative 3. Pain Management:  Oxycodone as needed--controlled at present 4. Mood: Provide emotional support  -encouraged her to ask for sleeping medicine if needed. She has never told RN that she's unable to sleep 5. Neuropsych: This patient is not fully capable of making decisions on her own behalf. 6. Skin/Wound Care: Routine skin checks 7. Fluids/Electrolytes/Nutrition: encourage po    8. Left carotid stenosis. Status post left CEA 04/08/2017. Incision clean 9. Hypertension. Norvasc 2.5 mg daily.   -fair control at present 10. Hyperlipidemia. Lipitor   LOS (Days) 5 A FACE TO FACE EVALUATION WAS PERFORMED  [name], MD 04/14/2017 9:11 AM

## 2017-04-14 NOTE — Progress Notes (Signed)
Physical Therapy Session Note  Patient Details  Name: [name] MRN: [id number] Date of Birth: [birth year]  Today's Date: 04/14/2017 PT Individual Time: 1108-1203 PT Individual Time Calculation (min): 55 min   Short Term Goals: Week 1:  PT Short Term Goal 1 (Week 1): Pt will perform sit to stand transfer wtih min assist and LRAD. PT Short Term Goal 2 (Week 1): Pt will perform mat to chair transfer with min assist with LRAD. PT Short Term Goal 3 (Week 1): Pt will ascend and descend 4 steps, one handrail, and min assist. PT Short Term Goal 4 (Week 1): Pt will ambulate 150 ft wtih supervision, LRAD.  Skilled Therapeutic Interventions/Progress Updates:    Pt received in room & agreeable to tx. Session focused on pt education, NMR, dynamic balance, R attention, and endurance training. Pt ambulated throughout unit without AD & steady assist fade to close supervision. Pt with improving weight shifting to RLE during gait but still with decreased stability with weight bearing through RLE. Pt requires occasional min assist 2/2 LOB throughout session. Pt engaged in dynavision while standing with supervision with 0.2 - 0.3 seconds slower reaction time to R side of board, without cuing. Pt ambulated in hallway locating [employer] plastic animals on R side with task focusing on R attention; pt continues to experience LOB with assistance to correct and requires max cuing to locate objects. On mat table pt performed bridging with adductor hold and RLE single leg bridging with cuing for proper technique. Pt reported lightheadedness with supine>sit that ceased with rest & pursed lip breathing. Pt utilized nu-step on level 2 x 10 minutes with all 4 extremities with task focusing on coordination of reciprocal movements and R NMR; pt required R hand attachment to maintain grasp. At end of session pt left sitting in recliner in room with all needs within reach.   Therapy Documentation Precautions:   Precautions Precautions: Fall Precaution Comments:  (R hemiparesis; L carotid endarterectomy on 04-08-17) Restrictions Weight Bearing Restrictions: No  Pain: Denied c/o pain.   See Function Navigator for Current Functional Status.   Therapy/Group: Individual Therapy  Waunita Schooner 04/14/2017, 12:18 PM

## 2017-04-14 NOTE — Progress Notes (Signed)
Occupational Therapy Session Note  Patient Details  Name: [name] [name] MRN: [id number] Date of Birth: 04-Oct-1946  Today's Date: 04/14/2017 OT Individual Time: 4037-5436 OT Individual Time Calculation (min): 33 min    Short Term Goals: Week 1:  OT Short Term Goal 1 (Week 1): Pt will complete ADL self care tasks with no more than 3 cues for attending to R side.  OT Short Term Goal 2 (Week 1): Pt will complete functional mobility transfers to chair/toilet with MinA  Skilled Therapeutic Interventions/Progress Updates:    Pt received sitting up in recliner having just finished lunch and [name] leaving room. Focus of session on toileting ADLs, and R UE fine and gross motor movement/coordination. Pt completed room level functional mobility with HHA and completed transfer to/from toilet with ModA. Pt performed peri hygiene and clothing management while in standing and washed hands standing at sink with MinA to steady throughout. OT then worked with Pt to complete dynamic seated and standing tabletop activities with focus on incorporating R UE into task completion. Educated Pt to use R UE as an active stabilizer while opening/closing various lids and containers, completing with HOH assist. Pt completed dynamic standing card sorting activity with MinA to steady and to facilitate weightbearing through R UE while reaching with L hand to pick up and place cards. Ended session with Pt seated in recliner, quick release belt in place, call bell and needs within reach.      Therapy Documentation Precautions:  Precautions Precautions: Fall Precaution Comments:  (R hemiparesis; L carotid endarterectomy on 04-08-17) Restrictions Weight Bearing Restrictions: No :  Pain: Pain Assessment Pain Assessment: Faces Faces Pain Scale: No hurt ADL: ADL ADL Comments: See Functional Navigator      See Function Navigator for Current Functional Status.   Therapy/Group: Individual Therapy   Lou Cal, OT  04/14/2017   Raymondo Band 04/14/2017, 3:24 PM

## 2017-04-14 NOTE — Progress Notes (Signed)
Speech Language Pathology Daily Session Note  Patient Details  Name: [name] [name] MRN: [id number] Date of Birth: 1946/09/24  Today's Date: 04/14/2017 SLP Individual Time: 3762-8315 SLP Individual Time Calculation (min): 55 min  Short Term Goals: Week 1: SLP Short Term Goal 1 (Week 1): short term goals equal long term goals.   Skilled Therapeutic Interventions: Skilled treatment session focused on addressing cognition goals. Patient completed a complex calendar organization task with Mod assist multimodal cues to recognize and correct errors with orally read directions, due to details such as weekend versus week and twice a day versus twice a week.  Patient then instructed SLP what to write where on the calendar with Min assist question cues to include details, due to right upper extremity weakness and patient reporting that she could not write left handed.  Patient was able to determine if other events could fit into the completed schedule with 50% accuracy, after SLP provided Mod question cues patient was able to problem solve the correct solutions with 100% accuracy.  Continue with current plan of care.   Function:   Cognition Comprehension Comprehension assist level: Follows basic conversation/direction with extra time/assistive device  Expression   Expression assist level: Expresses complex 90% of the time/cues < 10% of the time  Social Interaction Social Interaction assist level: Interacts appropriately with others with medication or extra time (anti-anxiety, antidepressant).  Problem Solving Problem solving assist level: Solves basic 75 - 89% of the time/requires cueing 10 - 24% of the time  Memory Memory assist level: Recognizes or recalls 90% of the time/requires cueing < 10% of the time    Pain Pain Assessment Pain Assessment: No/denies pain Faces Pain Scale: No hurt  Therapy/Group: Individual Therapy  Carmelia Roller., [place] [phone]  [place] 04/14/2017, 3:53  PM

## 2017-04-14 NOTE — Progress Notes (Signed)
Social Work Patient ID: [name] [name], female   DOB: 12-10-1945, 71 y.o.   MRN: [id number]   CSW received a phone call from pt's dtr requesting a letter from the MD with pt's dates of hospitalization and that it has not been determined when/if pt can return to work.  CSW has requested this letter from Dr. Naaman Plummer and will give it to the pt once completed and signed.  CSW remains available to assist as needed.

## 2017-04-14 NOTE — Progress Notes (Signed)
Occupational Therapy Session Note  Patient Details  Name: [name] [name] MRN: [id number] Date of Birth: [birth year]  Today's Date: 04/14/2017 OT Individual Time: 0905-1003 OT Individual Time Calculation (min): 58 min    Short Term Goals: Week 1:  OT Short Term Goal 1 (Week 1): Pt will complete ADL self care tasks with no more than 3 cues for attending to R side.  OT Short Term Goal 2 (Week 1): Pt will complete functional mobility transfers to chair/toilet with MinA  Skilled Therapeutic Interventions/Progress Updates:    Pt presented in bed at start of session. Focus of session on ADL completion including UB/LB bathing, UB/LB dressing, and standing grooming ADLs. Pt completed room level functional mobility with HHA, and later with RW with MinA. Noted Pt with increased confusion when steering and moving within room using RW. Pt completed showering task seated in shower, with education provided on compensatory techniques for using R UE to assist with obtaining items, apply soap to wash cloth and during bathing completion. Pt demonstrates increased ability to wash L UE using R UE this session. Pt requires MinA to steady when standing to wash perineal area and buttocks, requires assist to wash back. Pt completed UB/LB dressing seated EOB with close supervision for safety, educated on compensatory techniques for completing UB dressing with good follow through from Pt. Requires MinA to steady during standing to pull up underwear/pants. Pt completed standing grooming ADLs washing face and brushing teeth at sink with MinA to steady and HOH assist for using R UE to open/close containers. Ended session with Pt sitting up in recliner, quick release belt on, call bell and needs within reach.   Therapy Documentation Precautions:  Precautions Precautions: Fall Precaution Comments:  (R hemiparesis; L carotid endarterectomy on 04-08-17) Restrictions Weight Bearing Restrictions: No General:   Pain: Pain  Assessment Pain Assessment: Faces Faces Pain Scale: No hurt ADL: ADL ADL Comments: See Functional Navigator         See Function Navigator for Current Functional Status.   Therapy/Group: Individual Therapy   Lou Cal, OT  04/14/2017   Raymondo Band 04/14/2017, 12:19 PM

## 2017-04-15 ENCOUNTER — Inpatient Hospital Stay (HOSPITAL_COMMUNITY): Payer: Medicare Other | Admitting: Speech Pathology

## 2017-04-15 ENCOUNTER — Inpatient Hospital Stay (HOSPITAL_COMMUNITY): Payer: BLUE CROSS/BLUE SHIELD | Admitting: Occupational Therapy

## 2017-04-15 ENCOUNTER — Inpatient Hospital Stay (HOSPITAL_COMMUNITY): Payer: Medicare Other | Admitting: Occupational Therapy

## 2017-04-15 ENCOUNTER — Inpatient Hospital Stay (HOSPITAL_COMMUNITY): Payer: Medicare Other | Admitting: Physical Therapy

## 2017-04-15 NOTE — Progress Notes (Signed)
Occupational Therapy Session Note  Patient Details  Name: [name] [name] MRN: [id number] Date of Birth: 31-Jan-1946  Today's Date: 04/15/2017 OT Individual Time: 1300-1330 OT Individual Time Calculation (min): 30 min    Short Term Goals: Week 1:  OT Short Term Goal 1 (Week 1): Pt will complete ADL self care tasks with no more than 3 cues for attending to R side.  OT Short Term Goal 2 (Week 1): Pt will complete functional mobility transfers to chair/toilet with MinA Week 2:  OT Short Term Goal 1 (Week 2): Pt will utilize RUE during functional activity for 75% of task.  OT Short Term Goal 2 (Week 2): Pt will complete UB dressing with MinA.   Skilled Therapeutic Interventions/Progress Updates:  1:1 focus on functional ambulation around the unit with steadying A. Pt able to ambulate from her room to the ADL apartment. Pt participated in [place] and making the bed with focus on navigating around the bed without UE support, forced functional use of right hand with verbal cues and dynamic standing balance. Pt able to use right hand as gross assist throughout tasks but continues to have hav trouble with sustained grasp and bilateral UE tasks. Pt required steadying A for dynamic balance task with at times posterior lean. Left in recliner to rest with safety belt on.      Therapy Documentation Precautions:  Precautions Precautions: Fall Precaution Comments:  (R hemiparesis; L carotid endarterectomy on 04-08-17) Restrictions Weight Bearing Restrictions: No Pain: No c/o pain See Function Navigator for Current Functional Status.   Therapy/Group: Individual Therapy  Willeen Cass [facility] 04/15/2017, 3:25 PM

## 2017-04-15 NOTE — Progress Notes (Signed)
[place] PHYSICAL MEDICINE & REHABILITATION     PROGRESS NOTE    Subjective/Complaints: No new complaints. Actually slept well  ROS: pt denies nausea, vomiting, diarrhea, cough, shortness of breath or chest pain    Objective: Vital Signs: Blood pressure (!) 169/81, pulse 82, temperature 97.9 F (36.6 C), temperature source Oral, resp. rate 18, height 5\' 6"  (1.676 m), weight 69.4 kg (153 lb), SpO2 100 %. No results found. No results for input(s): WBC, HGB, HCT, PLT in the last 72 hours. No results for input(s): NA, K, CL, GLUCOSE, BUN, CREATININE, CALCIUM in the last 72 hours.  Invalid input(s): CO CBG (last 3)  No results for input(s): GLUCAP in the last 72 hours.  Wt Readings from Last 3 Encounters:  04/09/17 69.4 kg (153 lb)  04/01/17 63.5 kg (140 lb)    Physical Exam:  Constitutional: She is oriented to person, place, and time. She appears well-developed and well-nourished.  HENT:  Head: Normocephalic.  Neck CDI Eyes: EOM are normal. Right eye exhibits no discharge. Left eye exhibits no discharge.  Neck: Normal range of motion. Neck supple. No thyromegaly present.  Cardiovascular: RRR   Respiratory: CTA B GI: Soft. Bowel sounds are normal. She exhibits no distension.  Musculoskeletal: She exhibits edema and tenderness.  Neurological: She is alert and oriented to person, place, and time. Displays reasonable good insight and awareness Motor: Right shoulder abduction 2-/5, elbow flex/ext   2-/5, distally 2-/5. Good sitting balance RLE: 4/5 proximal to distal--motor stable LUE/LLE: 5/5 proximal to distal- DTRs symmetric  Skin:  Neck incision c/d/i  Psychiatric: She has a normal mood and affect. Her behavior is normal.   Assessment/Plan: 1. 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 Physiatrist is providing close team supervision  and 24 hour management of active medical problems listed below. Physiatrist and rehab team continue to assess barriers to discharge/monitor patient progress toward functional and medical goals.  Function:  Bathing Bathing position   Position: Shower  Bathing parts Body parts bathed by patient: Right arm, Chest, Front perineal area, Right upper leg, Left upper leg, Abdomen, Right lower leg, Left lower leg, Buttocks Body parts bathed by helper: Back  Bathing assist Assist Level: Touching or steadying assistance(Pt > 75%)      Upper Body Dressing/Undressing Upper body dressing   What is the patient wearing?: Pull over shirt/dress, Bra Bra - Perfomed by patient: Thread/unthread right bra strap, Thread/unthread left bra strap, Hook/unhook bra (pull down sports bra)   Pull over shirt/dress - Perfomed by patient: Thread/unthread right sleeve, Thread/unthread left sleeve, Put head through opening, Pull shirt over trunk Pull over shirt/dress - Perfomed by helper: Thread/unthread right sleeve, Put head through opening, Pull shirt over trunk Button up shirt - Perfomed by patient: Thread/unthread right sleeve, Thread/unthread left sleeve, Pull shirt around back Button up shirt - Perfomed by helper: Button/unbutton shirt    Upper body assist Assist Level: Supervision or verbal cues      Lower Body Dressing/Undressing Lower body dressing   What is the patient wearing?: Underwear, Pants, Socks Underwear - Performed by patient: Thread/unthread right underwear leg, Thread/unthread left underwear leg, Pull underwear up/down Underwear - Performed by helper: Pull underwear up/down Pants- Performed by patient: Thread/unthread left pants leg, Pull pants up/down, Thread/unthread right pants leg Pants- Performed by helper: Thread/unthread right pants leg Non-skid slipper socks- Performed by patient: Don/doff left sock Non-skid slipper  socks- Performed by helper: Don/doff right sock Socks - Performed by  patient: Don/doff right sock, Don/doff left sock             TED Hose - Performed by helper: Don/doff right TED hose, Don/doff left TED hose  Lower body assist Assist for lower body dressing: Touching or steadying assistance (Pt > 75%) (when standing to pull up )      Toileting Toileting   Toileting steps completed by patient: Adjust clothing prior to toileting, Performs perineal hygiene, Adjust clothing after toileting Toileting steps completed by helper: Performs perineal hygiene, Adjust clothing after toileting Toileting Assistive Devices: Grab bar or rail  Toileting assist Assist level: Touching or steadying assistance (Pt.75%)   Transfers Chair/bed transfer   Chair/bed transfer method: Ambulatory Chair/bed transfer assist level: Touching or steadying assistance (Pt > 75%) Chair/bed transfer assistive device: Armrests     Locomotion Ambulation     Max distance: >150 ft Assist level: Touching or steadying assistance (Pt > 75%)   Wheelchair     Max wheelchair distance:  (150 ft) Assist Level: Maximal assistance (Pt 25 - 49%)  Cognition Comprehension Comprehension assist level: Follows basic conversation/direction with extra time/assistive device  Expression Expression assist level: Expresses complex 90% of the time/cues < 10% of the time  Social Interaction Social Interaction assist level: Interacts appropriately with others with medication or extra time (anti-anxiety, antidepressant).  Problem Solving Problem solving assist level: Solves basic 75 - 89% of the time/requires cueing 10 - 24% of the time  Memory Memory assist level: Recognizes or recalls 90% of the time/requires cueing < 10% of the time   Medical Problem List and Plan: 1.  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  -continue PT, OT, SLP   -team conference today 2.  DVT Prophylaxis/Anticoagulation: Subcutaneous Lovenox. Monitor for any bleeding episodes. Venous Doppler  studies negative 3. Pain Management: Oxycodone as needed--controlled at present 4. Mood: Provide emotional support  -trazodone prn hs 5. Neuropsych: This patient is not fully capable of making decisions on her own behalf. 6. Skin/Wound Care: Routine skin checks 7. Fluids/Electrolytes/Nutrition: encourage po    8. Left carotid stenosis. Status post left CEA 04/08/2017. Incision clean 9. Hypertension. Norvasc 2.5 mg daily.   -fair control at present 10. Hyperlipidemia. Lipitor   LOS (Days) 6 A FACE TO FACE EVALUATION WAS PERFORMED  [name], MD 04/15/2017 9:06 AM

## 2017-04-15 NOTE — Progress Notes (Signed)
Speech Language Pathology Daily Session Note  Patient Details  Name: [name] [name] MRN: [id number] Date of Birth: 1945/11/27  Today's Date: 04/15/2017 SLP Individual Time: 1030-1100 SLP Individual Time Calculation (min): 30 min  Short Term Goals: Week 1: SLP Short Term Goal 1 (Week 1): short term goals equal long term goals.   Skilled Therapeutic Interventions: Skilled treatment session focused on cognitive goals. SLP facilitated session by providing extra time and supervision verbal cues for problem solving and Min A visual and verbal cues for completion of basic addition and subtraction during a mildly complex money management task. However, patient was Mod I for organization of information. Patient left upright in recliner with all needs within reach. Continue with current plan of care.      Function:  Cognition Comprehension Comprehension assist level: Understands complex 90% of the time/cues 10% of the time  Expression   Expression assist level: Expresses basic 90% of the time/requires cueing < 10% of the time.  Social Interaction Social Interaction assist level: Interacts appropriately with others with medication or extra time (anti-anxiety, antidepressant).  Problem Solving Problem solving assist level: Solves basic 75 - 89% of the time/requires cueing 10 - 24% of the time  Memory Memory assist level: Recognizes or recalls 90% of the time/requires cueing < 10% of the time    Pain No/Denies Pain   Therapy/Group: Individual Therapy  Deliana Avalos, [place] 04/15/2017, 11:12 AM

## 2017-04-15 NOTE — Progress Notes (Signed)
Physical Therapy Session Note  Patient Details  Name: [name] [name] MRN: [id number] Date of Birth: 1946/02/08  Today's Date: 04/15/2017 PT Individual Time: 1108-1204 PT Individual Time Calculation (min): 56 min   Short Term Goals: Week 1:  PT Short Term Goal 1 (Week 1): Pt will perform sit to stand transfer wtih min assist and LRAD. PT Short Term Goal 2 (Week 1): Pt will perform mat to chair transfer with min assist with LRAD. PT Short Term Goal 3 (Week 1): Pt will ascend and descend 4 steps, one handrail, and min assist. PT Short Term Goal 4 (Week 1): Pt will ambulate 150 ft wtih supervision, LRAD.  Skilled Therapeutic Interventions/Progress Updates:  Pt received in room & agreeable to tx. Session focused on R NMR, gait training, R attention, and transfers. Pt ambulated throughout unit without AD & supervision with improving weight shifting L/R but requires continued cuing for attention and obstacle avoidance on R. Pt utilized cybex kinetron in sitting then standing with min tactile cuing for weight shifting; task focused on R NMR, weight shifting, and strengthening. Throughout session pt completed sit<>stand transfers with armrests & supervision, pt would benefit from continued education to increase anterior weight shift to increase ease of transfer. Pt completed floor transfer with multiple rest breaks 2/2 fatigue, significantly increased amount of time, and max multimodal cuing for technique. Pt unable to transfer into quadruped to crawl towards chair and instead scooted backwards on buttock to get closer to stable chair. Pt then requires max multimodal cuing for problem solving as pt unsure how to get from floor>chair and mod assist. Therapist provides cuing to advance RLE forward to increase support. At end of session pt left sitting in recliner with QRB donned, all needs within reach, & visitor present.   Therapy Documentation Precautions:  Precautions Precautions: Fall Precaution  Comments:  (R hemiparesis; L carotid endarterectomy on 04-08-17) Restrictions Weight Bearing Restrictions: No   Pain: Denies c/o pain.   See Function Navigator for Current Functional Status.   Therapy/Group: Individual Therapy  Waunita Schooner 04/15/2017, 12:25 PM

## 2017-04-15 NOTE — Progress Notes (Signed)
Occupational Therapy Session Note  Patient Details  Name: [name] [name] MRN: [id number] Date of Birth: 1946-06-16  Today's Date: 04/15/2017 OT Individual Time: 1500-1529 OT Individual Time Calculation (min): 29 min    Short Term Goals: Week 1:  OT Short Term Goal 1 (Week 1): Pt will complete ADL self care tasks with no more than 3 cues for attending to R side.  OT Short Term Goal 2 (Week 1): Pt will complete functional mobility transfers to chair/toilet with MinA  Skilled Therapeutic Interventions/Progress Updates:    Upon entering the room, pt seated in recliner chair awaiting therapist with no c/o pain. Pt ambulates from room to day room without use of AD and close supervision. Pt performed 5 sets of sit <>stand from chair with focus on anterior weight shift and pt initially required min tactile cues but fading as pt progressed. Pt engaged in table top activity with focus on use of R UE . OT holding L UE as pt could not follow cues to not utilize it during task. Pt pushing individual pegs into resistive board with R UE and then demonstrating very gross grasp to remove from board. Pt having 5/17 drops while removing peg, lifting over box, and releasing on command with increased time and therapist assistance to limit compensatory movements. Pt standing for 13 minutes with overall supervision. Pt needing mod verbal guidance cues to locate room at end of session. Pt returning to bed with call bell and all needed items within reach.   Therapy Documentation Precautions:  Precautions Precautions: Fall Precaution Comments:  (R hemiparesis; L carotid endarterectomy on 04-08-17) Restrictions Weight Bearing Restrictions: No General:   Vital Signs: Therapy Vitals Temp: 98.5 F (36.9 C) Temp Source: Oral Pulse Rate: 76 Resp: 18 BP: (!) 149/70 Patient Position (if appropriate): Sitting Oxygen Therapy SpO2: 100 % O2 Device: Not Delivered Pain: Pain Assessment Pain Assessment: Faces Faces  Pain Scale: No hurt ADL: ADL ADL Comments: See Functional Navigator   See Function Navigator for Current Functional Status.   Therapy/Group: Individual Therapy  Gypsy Decant 04/15/2017, 6:48 PM

## 2017-04-15 NOTE — Progress Notes (Signed)
Occupational Therapy Session Note  Patient Details  Name: [name] [name] MRN: [id number] Date of Birth: [birth year]  Today's Date: 04/15/2017 OT Individual Time: 6803-2122 OT Individual Time Calculation (min): 30 min    Short Term Goals: Week 1:  OT Short Term Goal 1 (Week 1): Pt will complete ADL self care tasks with no more than 3 cues for attending to R side.  OT Short Term Goal 2 (Week 1): Pt will complete functional mobility transfers to chair/toilet with MinA  Skilled Therapeutic Interventions/Progress Updates:    Pt presents sitting up in recliner upon entering room for OT session. Focus of session on functional mobility transfers and increasing R UE fine and gross motor use. Pt completed tub transfer with tub bench with MinA and Mod verbal cues for sequencing. Pt completed transfer to/from [facility] over toilet x2 trials with MinA and emphasis on R UE placement to facilitate weightbearing through R UE to increase control and stability during rise and descent. Pt completed tabletop task seated in recliner picking up and placing items of varying shapes and sizes using RUE with HOH assist. Educated Pt on using L UE to assist R UE during task completion. Pt left sitting up in recliner, quick release belt in place, call bell and needs within reach.   Therapy Documentation Precautions:  Precautions Precautions: Fall Precaution Comments:  (R hemiparesis; L carotid endarterectomy on 04-08-17) Restrictions Weight Bearing Restrictions: No General:     Pain: Pain Assessment Pain Assessment: Faces Faces Pain Scale: No hurt ADL: ADL ADL Comments: See Functional Navigator   See Function Navigator for Current Functional Status.   Therapy/Group: Individual Therapy  Raymondo Band 04/15/2017, 3:50 PM

## 2017-04-15 NOTE — Progress Notes (Signed)
Occupational Therapy Session Note  Patient Details  Name: [name] [name] MRN: [id number] Date of Birth: July 26, 1946  Today's Date: 04/15/2017 OT Individual Time: 0800-0900 OT Individual Time Calculation (min): 60 min    Short Term Goals: Week 1:  OT Short Term Goal 1 (Week 1): Pt will complete ADL self care tasks with no more than 3 cues for attending to R side.  OT Short Term Goal 2 (Week 1): Pt will complete functional mobility transfers to chair/toilet with MinA  Skilled Therapeutic Interventions/Progress Updates:    Upon entering the room, pt seated on EOB receiving medications from RN. Pt with no c/o pain this session. Pt ambulating from bed to bathroom with min guard and without use of AD. Pt performing clothing management and hygiene after BM with supervision and verbal cues for technique for safety. Pt bathing from seated position with encouraged use of R UE for task. OT discussed use of bath mitt on R hand next session to increase use with functional task. Pt ambulates to bed with close supervision and is seated on EOB for dressing tasks. Pt utilized hemiplegic dressing techniques with increased time and set up for UB with steady assistance for balance during LB clothing management. Pt standing at sink for grooming tasks with mod cues for problem solving how to utilize R UE with tasks in order to be successful. Pt seated in recliner chair at end of session with call bell and all needed items within reach upon exiting the room.   Therapy Documentation Precautions:  Precautions Precautions: Fall Precaution Comments:  (R hemiparesis; L carotid endarterectomy on 04-08-17) Restrictions Weight Bearing Restrictions: No ADL: ADL ADL Comments: See Functional Navigator   See Function Navigator for Current Functional Status.   Therapy/Group: Individual Therapy  Gypsy Decant 04/15/2017, 9:34 AM

## 2017-04-16 ENCOUNTER — Inpatient Hospital Stay (HOSPITAL_COMMUNITY): Payer: Medicare Other | Admitting: Occupational Therapy

## 2017-04-16 ENCOUNTER — Inpatient Hospital Stay (HOSPITAL_COMMUNITY): Payer: Medicare Other | Admitting: Physical Therapy

## 2017-04-16 ENCOUNTER — Inpatient Hospital Stay (HOSPITAL_COMMUNITY): Payer: Medicare Other | Admitting: Speech Pathology

## 2017-04-16 ENCOUNTER — Encounter (HOSPITAL_COMMUNITY): Payer: BLUE CROSS/BLUE SHIELD | Admitting: *Deleted

## 2017-04-16 NOTE — Progress Notes (Signed)
Occupational Therapy Session Note  Patient Details  Name: [name] [name] MRN: [id number] Date of Birth: [birth year]  Today's Date: 04/16/2017 OT Individual Time: 0702-0758 OT Individual Time Calculation (min): 56 min    Short Term Goals: Week 1:  OT Short Term Goal 1 (Week 1): Pt will complete ADL self care tasks with no more than 3 cues for attending to R side.  OT Short Term Goal 2 (Week 1): Pt will complete functional mobility transfers to chair/toilet with MinA  Skilled Therapeutic Interventions/Progress Updates:   Upon entering the room, pt supine in bed awaiting therapist. Pt stating she was very tired from yesterday's therapy and already felt like she had "done a full days work". Pt was agreeable to OT intervention. Pt declined shower but requesting bathing at sink. Pt standing at sink for tasks with close supervision. Pt needing cues to sit to wash B feet and LB dressing for safety. Pt needing minimal cues this session to locate R UE for increased safety awareness. Pt needing min cues for anterior weight shift with sit <>stand transfers. Pt seated in recliner chair at end of session with call bell and all needed items within reach.   Therapy Documentation Precautions:  Precautions Precautions: Fall Precaution Comments:  (R hemiparesis; L carotid endarterectomy on 04-08-17) Restrictions Weight Bearing Restrictions: No Vital Signs: Therapy Vitals Temp: 98.7 F (37.1 C) Temp Source: Oral Pulse Rate: 74 Resp: 18 BP: (!) 146/76 Patient Position (if appropriate): Lying Oxygen Therapy SpO2: 100 % O2 Device: Not Delivered ADL: ADL ADL Comments: See Functional Navigator   See Function Navigator for Current Functional Status.   Therapy/Group: Individual Therapy  Gypsy Decant 04/16/2017, 8:05 AM

## 2017-04-16 NOTE — Progress Notes (Signed)
Speech Language Pathology Daily Session Note  Patient Details  Name: [name] [name] MRN: [id number] Date of Birth: 21-Jan-1946  Today's Date: 04/16/2017 SLP Individual Time: 1345-1430 SLP Individual Time Calculation (min): 45 min  Short Term Goals: Week 1: SLP Short Term Goal 1 (Week 1): short term goals equal long term goals.   Skilled Therapeutic Interventions: Skilled treatment session focused on cognitive goals. SLP facilitated session by providing Mod A verbal cues for recall of procedures and problem solving during a mildly complex but novel card task. Patient left upright in recliner with all needs within reach. Continue with current plan of care.      Function:  Cognition Comprehension Comprehension assist level: Follows basic conversation/direction with extra time/assistive device  Expression   Expression assist level: Expresses complex 90% of the time/cues < 10% of the time  Social Interaction Social Interaction assist level: Interacts appropriately with others with medication or extra time (anti-anxiety, antidepressant).  Problem Solving Problem solving assist level: Solves basic 75 - 89% of the time/requires cueing 10 - 24% of the time  Memory Memory assist level: Recognizes or recalls 75 - 89% of the time/requires cueing 10 - 24% of the time    Pain No/Denies Pain   Therapy/Group: Individual Therapy  Dearis Danis 04/16/2017, 3:03 PM

## 2017-04-16 NOTE — Progress Notes (Signed)
[name] PHYSICAL MEDICINE & REHABILITATION     PROGRESS NOTE    Subjective/Complaints: No complaints and no problems reported  ROS: pt denies nausea, vomiting, diarrhea, cough, shortness of breath or chest pain    Objective: Vital Signs: Blood pressure (!) 146/76, pulse 74, temperature 98.7 F (37.1 C), temperature source Oral, resp. rate 18, height 5\' 6"  (1.676 m), weight 69.4 kg (153 lb), SpO2 100 %. No results found. No results for input(s): WBC, HGB, HCT, PLT in the last 72 hours. No results for input(s): NA, K, CL, GLUCOSE, BUN, CREATININE, CALCIUM in the last 72 hours.  Invalid input(s): CO CBG (last 3)  No results for input(s): GLUCAP in the last 72 hours.  Wt Readings from Last 3 Encounters:  04/09/17 69.4 kg (153 lb)  04/01/17 63.5 kg (140 lb)    Physical Exam:  Constitutional: She is oriented to person, place, and time. She appears well-developed and well-nourished.  HENT:  Head: Normocephalic.  Neck CDI Eyes: EOM are normal. Right eye exhibits no discharge. Left eye exhibits no discharge.  Neck: Normal range of motion. Neck supple. No thyromegaly present.  Cardiovascular: RRR   Respiratory: CTA B GI: Soft. Bowel sounds are normal. She exhibits no distension.  Musculoskeletal: She exhibits edema and tenderness.  Neurological: She is alert and oriented to person, place, and time. Displays reasonable good insight and awareness Motor: Right shoulder abduction 2-/5, elbow flex/ext   2-/5, distally 2-/5. Good sitting balance RLE: 4/5 proximal to distal--no changes LUE/LLE: 5/5 proximal to distal- DTRs symmetric  Skin:  Neck incision c/d/i  Psychiatric: She has a normal mood and affect. Her behavior is normal.   Assessment/Plan: 1. 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 Physiatrist is providing close team supervision and  24 hour management of active medical problems listed below. Physiatrist and rehab team continue to assess barriers to discharge/monitor patient progress toward functional and medical goals.  Function:  Bathing Bathing position   Position: Standing at sink  Bathing parts Body parts bathed by patient: Right arm, Chest, Front perineal area, Right upper leg, Left upper leg, Abdomen, Buttocks, Left arm Body parts bathed by helper: Back  Bathing assist Assist Level: Touching or steadying assistance(Pt > 75%)      Upper Body Dressing/Undressing Upper body dressing   What is the patient wearing?: Pull over shirt/dress, Bra Bra - Perfomed by patient: Thread/unthread right bra strap, Thread/unthread left bra strap, Hook/unhook bra (pull down sports bra)   Pull over shirt/dress - Perfomed by patient: Thread/unthread right sleeve, Thread/unthread left sleeve, Put head through opening, Pull shirt over trunk Pull over shirt/dress - Perfomed by helper: Thread/unthread right sleeve, Put head through opening, Pull shirt over trunk Button up shirt - Perfomed by patient: Thread/unthread right sleeve, Thread/unthread left sleeve, Pull shirt around back Button up shirt - Perfomed by helper: Button/unbutton shirt    Upper body assist Assist Level: Supervision or verbal cues   Set up : To obtain clothing/put away  Lower Body Dressing/Undressing Lower body dressing   What is the patient wearing?: Underwear, Pants, Shoes, [employer]  - Performed by patient: Thread/unthread right underwear leg, Thread/unthread left underwear leg, Pull underwear up/down Underwear - Performed by helper: Pull underwear up/down Pants- Performed by patient: Thread/unthread left pants leg, Pull pants up/down, Thread/unthread right pants leg Pants- Performed by helper: Thread/unthread right pants leg Non-skid slipper socks- Performed by patient:  Don/doff left sock Non-skid slipper socks- Performed by helper: Don/doff right  sock Socks - Performed by patient: Don/doff right sock, Don/doff left sock   Shoes - Performed by patient: Don/doff right shoe, Don/doff left shoe         TED Hose - Performed by helper: Don/doff right TED hose, Don/doff left TED hose  Lower body assist Assist for lower body dressing: Touching or steadying assistance (Pt > 75%)      Toileting Toileting   Toileting steps completed by patient: Adjust clothing prior to toileting, Performs perineal hygiene Toileting steps completed by helper: Performs perineal hygiene, Adjust clothing after toileting Toileting Assistive Devices: Grab bar or rail  Toileting assist Assist level: Touching or steadying assistance (Pt.75%)   Transfers Chair/bed transfer   Chair/bed transfer method: Ambulatory Chair/bed transfer assist level: Touching or steadying assistance (Pt > 75%) Chair/bed transfer assistive device: Armrests     Locomotion Ambulation     Max distance: 150 ft Assist level: Supervision or verbal cues   Wheelchair     Max wheelchair distance:  (150 ft) Assist Level: Maximal assistance (Pt 25 - 49%)  Cognition Comprehension Comprehension assist level: Understands complex 90% of the time/cues 10% of the time  Expression Expression assist level: Expresses basic 90% of the time/requires cueing < 10% of the time.  Social Interaction Social Interaction assist level: Interacts appropriately with others with medication or extra time (anti-anxiety, antidepressant).  Problem Solving Problem solving assist level: Solves basic 75 - 89% of the time/requires cueing 10 - 24% of the time  Memory Memory assist level: Recognizes or recalls 90% of the time/requires cueing < 10% of the time   Medical Problem List and Plan: 1.  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  -continue PT, OT, SLP   -progressing toward goals 2.  DVT Prophylaxis/Anticoagulation: Subcutaneous Lovenox. Monitor for any bleeding episodes.  Venous Doppler studies negative 3. Pain Management: Oxycodone as needed--controlled at present 4. Mood: Provide emotional support  -trazodone prn hs 5. Neuropsych: This patient is not fully capable of making decisions on her own behalf. 6. Skin/Wound Care: Routine skin checks 7. Fluids/Electrolytes/Nutrition: encourage po    8. Left carotid stenosis. Status post left CEA 04/08/2017. Incision clean 9. Hypertension. Norvasc 2.5 mg daily.   -fair control at present 10. Hyperlipidemia. Lipitor   LOS (Days) 7 A FACE TO FACE EVALUATION WAS PERFORMED  [name], MD 04/16/2017 9:11 AM

## 2017-04-16 NOTE — Plan of Care (Signed)
Problem: RH Wheelchair Mobility Goal: LTG Patient will propel w/c in controlled environment (PT) LTG: Patient will propel wheelchair in controlled environment, # of feet with assist (PT)  Outcome: Adequate for Discharge Pt to d/c at ambulatory level  Problem: RH Stairs Goal: LTG Patient will ambulate up and down stairs w/assist (PT) LTG: Patient will ambulate up and down # of stairs with assistance (PT)  4 steps without rails, downgrade 2/2 slow progress

## 2017-04-16 NOTE — Progress Notes (Signed)
Social Work Patient ID: [name] [name], female   DOB: [birth year], 71 y.o.   MRN: [id number]   CSW met with pt and then spoke with dtr via telephone to update them both on team conference discussion and targeted d/c of 04-19-17.  Pt is nervous/apprehensive about going home.  Pt's dtr plans for pt to come to her home and she will take some time off to be with her.  When she needs to return to work, she and pt will discuss adult day program, other family coming to be with her, or assisted living, if necessary.  Pt and dtr are hopeful that pt will continue to improve and this will not be needed.  CSW to assist with DME and [place] f/u, as well as giving them resources on adult day program.  Dtr to come on Friday, 04-18-17 for family education.  CSW remains available as needed.

## 2017-04-16 NOTE — Patient Care Conference (Signed)
Inpatient RehabilitationTeam Conference and Plan of Care Update Date: 04/15/2017   Time: 2:30 PM    Patient Name: [name] [name]      Medical Record Number: [id number]  Date of Birth: February 17, 1946 Sex: Female         Room/Bed: 4W18C/4W18C-01 Payor Info: Payor: [place] / Plan: BCBS OTHER / Product Type: *No Product type* /    Admitting Diagnosis: Stroke  Admit Date/Time:  04/09/2017  2:35 PM Admission Comments: No comment available   Primary Diagnosis:  Acute bilat watershed infarction [facility]) Principal Problem: Acute bilat watershed infarction [facility])  Patient Active Problem List   Diagnosis Date Noted  . Acute bilat watershed infarction [facility]) 04/09/2017  . CVA (cerebral vascular accident) ([place])   . 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 Post-operative pain   . Status post carotid endarterectomy   . Benign essential HTN   . Stenosis of left carotid artery   . Hyperlipidemia   . Gait disturbance, post-stroke 04/02/2017  . Right arm weakness   . Slowness and poor responsiveness   . Hypertensive emergency 04/01/2017  . Hypokalemia 04/01/2017  . Hyperglycemia 04/01/2017  . Stroke-like symptoms 04/01/2017  . Stroke (cerebrum) ([place]) 04/01/2017    Expected Discharge Date: Expected Discharge Date: 04/19/17  Team Members Present: Physician leading conference: Dr. Alger Simons Social Worker Present: Alfonse Alpers, LCSW Nurse Present: Heather Roberts, RN;Luz Rosero, RN PT Present: Lavone Nian, PT OT Present: Benay Pillow, OT SLP Present: Windell Moulding, SLP PPS Coordinator present : Daiva Nakayama, RN, CRRN     Current Status/Progress Goal Weekly Team Focus  Medical   bilateral watershed infarcts with right > left weakness. htn. insomnia  improve functional mobility  sleep, bp control, nutrition   Bowel/Bladder   Continent of B&B.  Continue continence  Assist to toilet prn.   Swallow/Nutrition/ Hydration             ADL's   Min A  bathing at shower level, supervision UB dressing, Min guard LB dressing, Min-Mod A bathroom transfers, Min A toileting   Supervision overall  R NMR, functional transfers, balance, safety awareness    Mobility   steady assist/supervision without AD, R inattention improving  supervision overall  pt education, safety awareness, R NMR, endurance, gait   Communication   Min A with extra time   Mod I complex verbal expression   use of word-finding strategies in conversation    Safety/Cognition/ Behavioral Observations  Mod A  Min A for complex problem solving  problem solving, self-monitoring and correcting errors    Pain   Not c/o pain  Continue to have no pain.  Continue to monitor for pain.   Skin   No issues  Continue with no issues.  Continue to assess skin.    Rehab Goals Patient on target to meet rehab goals: Yes Rehab Goals Revised: Pt will need minimal assistance with stairs. *See Care Plan and progress notes for long and short-term goals.  Barriers to Discharge: ongoing right hemiparesis    Possible Resolutions to Barriers:  adaptive techniques and equipment, NMR, strength training    Discharge Planning/Teaching Needs:  Pt plans to return to her home with her family to provide supervision.  CSW will confirm this with her dtr.  Therapists would like for dtr to come in for family training prior to d/c.  CSW will arrange.   Team Discussion:  Pt is doing well after bilateral watershed infarcts.  She  is currently supervision to min assist with ADLs and is using upper extremity.  PT has difficulty with inattention and will need supervision for problem solving and awareness.  Due to pt's cognition, ST is recommending 24/7 supervision.  Revisions to Treatment Plan:  none   Continued Need for Acute Rehabilitation Level of Care: The patient requires daily medical management by a physician with specialized training in physical medicine and rehabilitation for the following  conditions: Daily direction of a multidisciplinary physical rehabilitation program to ensure safe treatment while eliciting the highest outcome that is of practical value to the patient.: Yes Daily medical management of patient stability for increased activity during participation in an intensive rehabilitation regime.: Yes Daily analysis of laboratory values and/or radiology reports with any subsequent need for medication adjustment of medical intervention for : Neurological problems;Blood pressure problems  Dasean Brow, Silvestre Mesi 04/16/2017, 11:34 AM

## 2017-04-16 NOTE — Progress Notes (Signed)
Physical Therapy Session Note  Patient Details  Name: [name] [name] MRN: [id number] Date of Birth: [birth year]  Today's Date: 04/16/2017 PT Individual Time: 6948-5462 and 7035-0093 PT Individual Time Calculation (min): 59 min and 32 min  Short Term Goals: Week 1:  PT Short Term Goal 1 (Week 1): Pt will perform sit to stand transfer wtih min assist and LRAD. PT Short Term Goal 2 (Week 1): Pt will perform mat to chair transfer with min assist with LRAD. PT Short Term Goal 3 (Week 1): Pt will ascend and descend 4 steps, one handrail, and min assist. PT Short Term Goal 4 (Week 1): Pt will ambulate 150 ft wtih supervision, LRAD.  Skilled Therapeutic Interventions/Progress Updates:  Treatment 1: Pt received in room & agreeable to tx, denying c/o pain. Session focused on gait training, stair negotiation, d/c planning, and cognitive remediation. Pt ambulated throughout unit without AD and supervision; pt demonstrates decreased foot clearance & heel strike RLE. Provided demo and education for increased heel strike and pt with fair return demo with continued cuing. Pt negotiated 6 steps without rails and mod assist (LUE HHA) and max cuing for step-to, compensatory technique. Pt continues to lean on R rail for support therefore transitioned to practicing stair negotiation in stairwell. Pt continues to require cuing to prevent stepping towards R rail but negotiates full flight of stairs with mod assist HHA. Pt requires MAX verbal cuing for compensatory pattern to prevent loss of balance, as well as increased hip/knee flexion RLE to descend stairs as pt would attempt to slide LE down to next step. Pt would benefit from continued practice with stair negotiation to increase safety & awareness. Pt stood on wedge while engaging in pipe tree assembly with task focusing on stretching heel cords and problem solving. Pt requires max cuing for problem solving during task, as well as significantly extra time to complete task.  Pt with impaired perception of shape, as she would report the one she built does not resemble the picture; therapist provided cuing and instruction. At end of session pt left sitting in recliner in room with all needs within reach.   Treatment 2: Pt received in room & agreeable to tx, denying c/o pain. Session focused on functional mobility (stair negotiation, gait, and transfers), NMR, strengthening, & balance. Pt reports need to use restroom and ambulates within room & bathroom with supervision. Pt requires supervision for toilet transfer and peri-hygiene following continent void. Pt negotiates stairs with mod assist and max verbal cuing for compensatory pattern. Pt also requires HHA for balance as she seems hesitant to weight shift and take complete steps, instead pt prefers to slide R foot down to next step when descending stairs. Challenged pt's balance by having her stand on compliant surface with eyes open/eyes closed and normal/narrow base of support with steady assist for balance. Pt utilized nu-step on level 3 x 8 minutes with RUE support with task focusing on endurance training & R NMR. At end of session pt left sitting in recliner in room with all needs within reach.   Therapy Documentation Precautions:  Precautions Precautions: Fall Precaution Comments:  (R hemiparesis; L carotid endarterectomy on 04-08-17) Restrictions Weight Bearing Restrictions: No   See Function Navigator for Current Functional Status.   Therapy/Group: Individual Therapy  Waunita Schooner 04/16/2017, 3:41 PM

## 2017-04-17 ENCOUNTER — Inpatient Hospital Stay (HOSPITAL_COMMUNITY): Payer: Medicare Other | Admitting: Physical Therapy

## 2017-04-17 ENCOUNTER — Inpatient Hospital Stay (HOSPITAL_COMMUNITY): Payer: Medicare Other | Admitting: Speech Pathology

## 2017-04-17 ENCOUNTER — Inpatient Hospital Stay (HOSPITAL_COMMUNITY): Payer: BLUE CROSS/BLUE SHIELD | Admitting: Occupational Therapy

## 2017-04-17 NOTE — Progress Notes (Signed)
Physical Therapy Session Note  Patient Details  Name: [name] [name] MRN: [id number] Date of Birth: [birth year]  Today's Date: 04/17/2017 PT Individual Time: 0901-1001 and 1305-1330 PT Individual Time Calculation (min): 60 min and 25 min  Short Term Goals: Week 1:  PT Short Term Goal 1 (Week 1): Pt will perform sit to stand transfer wtih min assist and LRAD. PT Short Term Goal 2 (Week 1): Pt will perform mat to chair transfer with min assist with LRAD. PT Short Term Goal 3 (Week 1): Pt will ascend and descend 4 steps, one handrail, and min assist. PT Short Term Goal 4 (Week 1): Pt will ambulate 150 ft wtih supervision, LRAD.  Skilled Therapeutic Interventions/Progress Updates:  Treatment 1: Pt received in room & agreeable to tx, denying c/o pain. Session focused on pt education & d/c planning, stair negotiation, gait, coordination and balance. Pt ambulated throughout unit without AD & supervision with occasional cuing for obstacle avoidance on R; pt would hit door frame on R when going through doorway. Pt negotiates a flight of stairs x 2 trials with mod/max assist on first trial and mod assist on 2nd trial. Pt with improving ability to self correct and step down during 2nd trial when therapist did not give any cuing at all. Pt performed toe taps on 6" step with task focusing on weight shifting L/R, standing balance, and coordination. Pt requires max cuing for foot placement and mod assist for balance. Pt also performed sit<>stand transfers without BUE with task focusing on anterior weight shifting and BLE strengthening; pt required min assist fade to steady assist to complete sit<>stands.  During session therapist educated pt on recovery following stroke, d/c plans, and recommendations of HHPT. At end of session pt left sitting in recliner with all needs within reach.   Treatment 2: Pt received in room & agreeable to tx, no c/o pain noted. Session focused on stair negotiation for balance and RUE task  for NMR. Pt requires mod assist to negotiate stairs with LUE HHA and no rails; pt able to self correct and demonstrate compensatory pattern without cuing from therapist and extra time. Pt with more unsteadiness when ascending stairs versus descending during this trial. Pt then engaged in RUE activity with task focusing on extension of fingers, grasping cup, and transitioning cup to mouth. Pt requires max cuing to follow one step commands and fatigues quickly with RUE finger extension. At end of session pt left sitting in recliner with all needs within reach.   Therapy Documentation Precautions:  Precautions Precautions: Fall Precaution Comments:  (R hemiparesis; L carotid endarterectomy on 04-08-17) Restrictions Weight Bearing Restrictions: No   See Function Navigator for Current Functional Status.   Therapy/Group: Individual Therapy  Waunita Schooner 04/17/2017, 2:12 PM

## 2017-04-17 NOTE — Progress Notes (Signed)
Speech Language Pathology Daily Session Note  Patient Details  Name: [name] [name] MRN: [id number] Date of Birth: 1946-09-27  Today's Date: 04/17/2017 SLP Individual Time: 0730-0830 SLP Individual Time Calculation (min): 60 min  Short Term Goals: Week 1: SLP Short Term Goal 1 (Week 1): short term goals equal long term goals.   Skilled Therapeutic Interventions: Skilled treatment session focused on cognitive-linguistic goals. SLP facilitated session by providing intermittent supervision verbal cues for word-finding and specificity during a mildly complex verbal description task. Patient left upright in recliner with all needs within reach. Continue with current plan of care.      Function:  Cognition Comprehension Comprehension assist level: Understands complex 90% of the time/cues 10% of the time  Expression   Expression assist level: Expresses basic needs/ideas: With no assist  Social Interaction Social Interaction assist level: Interacts appropriately with others with medication or extra time (anti-anxiety, antidepressant).  Problem Solving Problem solving assist level: Solves basic 75 - 89% of the time/requires cueing 10 - 24% of the time  Memory Memory assist level: Recognizes or recalls 75 - 89% of the time/requires cueing 10 - 24% of the time    Pain Pain Assessment Pain Assessment: Faces Faces Pain Scale: No hurt  Therapy/Group: Individual Therapy  Cassady Stanczak 04/17/2017, 3:57 PM

## 2017-04-17 NOTE — Progress Notes (Signed)
[place] PHYSICAL MEDICINE & REHABILITATION     PROGRESS NOTE    Subjective/Complaints: No complaints and no problems reported  ROS: pt denies nausea, vomiting, diarrhea, cough, shortness of breath or chest pain    Objective: Vital Signs: Blood pressure (!) 171/66, pulse 76, temperature 97.8 F (36.6 C), temperature source Oral, resp. rate 17, height 5\' 6"  (1.676 m), weight 71.2 kg (156 lb 14.4 oz), SpO2 100 %. No results found. No results for input(s): WBC, HGB, HCT, PLT in the last 72 hours. No results for input(s): NA, K, CL, GLUCOSE, BUN, CREATININE, CALCIUM in the last 72 hours.  Invalid input(s): CO CBG (last 3)  No results for input(s): GLUCAP in the last 72 hours.  Wt Readings from Last 3 Encounters:  04/17/17 71.2 kg (156 lb 14.4 oz)  04/01/17 63.5 kg (140 lb)    Physical Exam:  Constitutional: She is oriented to person, place, and time. She appears well-developed and well-nourished.  HENT:  Head: Normocephalic.  Neck CDI Eyes: EOM are normal. Right eye exhibits no discharge. Left eye exhibits no discharge.  Neck: Normal range of motion. Neck supple. No thyromegaly present.  Cardiovascular: RRR   Respiratory: CTA B GI: Soft. Bowel sounds are normal. She exhibits no distension.  Musculoskeletal: She exhibits edema and tenderness.  Neurological: She is alert and oriented to person, place, and time. Displays reasonable good insight and awareness Motor: Right shoulder abduction 2/5, elbow flex/ext   2/5, distally 2/5. Good sitting balance RLE: 4/5 proximal to distal--no changes LUE/LLE: 5/5 proximal to distal- DTRs symmetric  Skin:  Neck incision c/d/i  Psychiatric: She has a normal mood and affect. Her behavior is normal.   Assessment/Plan: 1. 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 Physiatrist is providing close team  supervision and 24 hour management of active medical problems listed below. Physiatrist and rehab team continue to assess barriers to discharge/monitor patient progress toward functional and medical goals.  Function:  Bathing Bathing position   Position: Standing at sink  Bathing parts Body parts bathed by patient: Right arm, Chest, Front perineal area, Right upper leg, Left upper leg, Abdomen, Buttocks, Left arm Body parts bathed by helper: Back  Bathing assist Assist Level: Touching or steadying assistance(Pt > 75%)      Upper Body Dressing/Undressing Upper body dressing   What is the patient wearing?: Pull over shirt/dress, Bra Bra - Perfomed by patient: Thread/unthread right bra strap, Thread/unthread left bra strap, Hook/unhook bra (pull down sports bra)   Pull over shirt/dress - Perfomed by patient: Thread/unthread right sleeve, Thread/unthread left sleeve, Put head through opening, Pull shirt over trunk Pull over shirt/dress - Perfomed by helper: Thread/unthread right sleeve, Put head through opening, Pull shirt over trunk Button up shirt - Perfomed by patient: Thread/unthread right sleeve, Thread/unthread left sleeve, Pull shirt around back Button up shirt - Perfomed by helper: Button/unbutton shirt    Upper body assist Assist Level: Supervision or verbal cues   Set up : To obtain clothing/put away  Lower Body Dressing/Undressing Lower body dressing   What is the patient wearing?: Underwear, Pants, Shoes, [employer]  - Performed by patient: Thread/unthread right underwear leg, Thread/unthread left underwear leg, Pull underwear up/down Underwear - Performed by helper: Pull underwear up/down Pants- Performed by patient: Thread/unthread left pants leg, Pull pants up/down, Thread/unthread right pants leg Pants- Performed by helper: Thread/unthread right pants leg Non-skid slipper  socks- Performed by patient: Don/doff left sock Non-skid slipper socks- Performed by helper:  Don/doff right sock Socks - Performed by patient: Don/doff right sock, Don/doff left sock   Shoes - Performed by patient: Don/doff right shoe, Don/doff left shoe         TED Hose - Performed by helper: Don/doff right TED hose, Don/doff left TED hose  Lower body assist Assist for lower body dressing: Touching or steadying assistance (Pt > 75%)      Toileting Toileting   Toileting steps completed by patient: Adjust clothing prior to toileting, Performs perineal hygiene Toileting steps completed by helper: Performs perineal hygiene, Adjust clothing after toileting Toileting Assistive Devices: Grab bar or rail  Toileting assist Assist level: Touching or steadying assistance (Pt.75%)   Transfers Chair/bed transfer   Chair/bed transfer method: Ambulatory Chair/bed transfer assist level: Touching or steadying assistance (Pt > 75%) Chair/bed transfer assistive device: Armrests     Locomotion Ambulation     Max distance: 150 ft Assist level: Supervision or verbal cues   Wheelchair     Max wheelchair distance:  (150 ft) Assist Level: Maximal assistance (Pt 25 - 49%)  Cognition Comprehension Comprehension assist level: Understands complex 90% of the time/cues 10% of the time  Expression Expression assist level: Expresses basic needs/ideas: With no assist  Social Interaction Social Interaction assist level: Interacts appropriately with others with medication or extra time (anti-anxiety, antidepressant).  Problem Solving Problem solving assist level: Solves basic 75 - 89% of the time/requires cueing 10 - 24% of the time  Memory Memory assist level: Recognizes or recalls 75 - 89% of the time/requires cueing 10 - 24% of the time   Medical Problem List and Plan: 1.  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  -continue PT, OT, SLP   -dc 7/9 2.  DVT Prophylaxis/Anticoagulation: Subcutaneous Lovenox. Monitor for any bleeding episodes. Venous Doppler  studies negative 3. Pain Management: Oxycodone as needed--controlled at present 4. Mood: Provide emotional support  -trazodone prn hs 5. Neuropsych: This patient is not fully capable of making decisions on her own behalf. 6. Skin/Wound Care: Routine skin checks 7. Fluids/Electrolytes/Nutrition: encourage po    8. Left carotid stenosis. Status post left CEA 04/08/2017. Incision site clean 9. Hypertension. Norvasc 2.5 mg daily.   -fair control at present 10. Hyperlipidemia. Lipitor   LOS (Days) 8 A FACE TO FACE EVALUATION WAS PERFORMED  [name], MD 04/17/2017 8:59 AM

## 2017-04-17 NOTE — Plan of Care (Signed)
Problem: RH Stairs Goal: LTG Patient will ambulate up and down stairs w/assist (PT) LTG: Patient will ambulate up and down # of stairs with assistance (PT)  4 steps without rails; downgrade 2/2 slow progress

## 2017-04-17 NOTE — Progress Notes (Signed)
Occupational Therapy Session Note  Patient Details  Name: Cobi Aldape MRN: [id number] Date of Birth: 04/09/1946  Today's Date: 04/17/2017 OT Individual Time: 1101-1203 OT Individual Time Calculation (min): 62 min    Short Term Goals: Week 1:  OT Short Term Goal 1 (Week 1): Pt will complete ADL self care tasks with no more than 3 cues for attending to R side.  OT Short Term Goal 2 (Week 1): Pt will complete functional mobility transfers to chair/toilet with MinA  Skilled Therapeutic Interventions/Progress Updates:    Pt presented sitting up in recliner in room ready for OT session. Focus of session on ADL self care tasks and simple IADL tasks. Pt requesting to wash up at sink this session, washing face, UEs, perineal and buttocks area in standing with close guard for safety. Educated Pt on safety during bathing at home, emphasizing completing bathing task while seated when completing task in shower to increase safety/decrease fall risk with Pt verbalizing understanding. Pt completed UB/LB dressing seated EOB, required mod verbal cues to sequence and correctly don button up shirt. Pt required HOH assist to button shirt initially, able to button 3 additional buttons independently. Pt completed standing grooming ADLs at sink with supervision. Transported Pt via w/c (for time management) to rehab kitchen. Pt navigated kitchen, opening and closing cabinets and refrigerator to obtain and transport items with close supervision. Pt still demonstrates mild R neglect during task, bumping into counter and refrigerator door, but is able to self correct. Ended session with Pt in room sitting up in recliner, quick release belt on, call bell and needs within reach.   Therapy Documentation Precautions:  Precautions Precautions: Fall Precaution Comments:  (R hemiparesis; L carotid endarterectomy on 04-08-17) Restrictions Weight Bearing Restrictions: No   Pain: Pain Assessment Pain Assessment: Faces Faces  Pain Scale: No hurt ADL: ADL ADL Comments: See Functional Navigator     See Function Navigator for Current Functional Status.   Therapy/Group: Individual Therapy  Raymondo Band 04/17/2017, 3:42 PM

## 2017-04-18 ENCOUNTER — Encounter (HOSPITAL_COMMUNITY): Payer: BLUE CROSS/BLUE SHIELD

## 2017-04-18 ENCOUNTER — Inpatient Hospital Stay (HOSPITAL_COMMUNITY): Payer: Medicare Other | Admitting: Physical Therapy

## 2017-04-18 ENCOUNTER — Inpatient Hospital Stay (HOSPITAL_COMMUNITY): Payer: Medicare Other | Admitting: Speech Pathology

## 2017-04-18 MED ORDER — AMLODIPINE BESYLATE 2.5 MG PO TABS: 2.5000 mg | ORAL_TABLET | Freq: Every day | ORAL | 0 refills | Status: DC

## 2017-04-18 MED ORDER — ATORVASTATIN CALCIUM 80 MG PO TABS: 80.0000 mg | ORAL_TABLET | Freq: Every day | ORAL | 0 refills | Status: DC

## 2017-04-18 MED ORDER — CLOPIDOGREL BISULFATE 75 MG PO TABS: 75.0000 mg | ORAL_TABLET | Freq: Every day | ORAL | 0 refills | Status: DC

## 2017-04-18 MED ORDER — AMLODIPINE BESYLATE 5 MG PO TABS: 5.0000 mg | ORAL_TABLET | Freq: Every day | ORAL | 0 refills | Status: DC

## 2017-04-18 MED ORDER — ASPIRIN 325 MG PO TABS: 325.0000 mg | ORAL_TABLET | Freq: Every day | ORAL | 0 refills | Status: DC

## 2017-04-18 MED ORDER — AMLODIPINE BESYLATE 5 MG PO TABS
5.0000 mg | ORAL_TABLET | Freq: Every day | ORAL | Status: DC
  Administered 2017-04-19: 5 mg via ORAL
  Filled 2017-04-18: qty 1

## 2017-04-18 MED ORDER — AMLODIPINE BESYLATE 2.5 MG PO TABS
2.5000 mg | ORAL_TABLET | Freq: Once | ORAL | Status: AC
  Administered 2017-04-18: 2.5 mg via ORAL
  Filled 2017-04-18: qty 1

## 2017-04-18 MED ORDER — PANTOPRAZOLE SODIUM 40 MG PO TBEC: 40.0000 mg | DELAYED_RELEASE_TABLET | Freq: Every day | ORAL | 0 refills | Status: DC

## 2017-04-18 MED ORDER — TRAZODONE HCL 50 MG PO TABS
25.0000 mg | ORAL_TABLET | Freq: Every evening | ORAL | 0 refills | Status: DC | PRN
Start: 2017-04-18 — End: 2017-05-21

## 2017-04-18 MED ORDER — DOCUSATE SODIUM 100 MG PO CAPS: 100.0000 mg | ORAL_CAPSULE | Freq: Every day | ORAL | 0 refills | Status: DC

## 2017-04-18 NOTE — Progress Notes (Signed)
Social Work  Discharge Note  The overall goal for the admission was met for:   Discharge location: Yes - home with daughter to provide 24/7 assistance  Length of Stay: Yes - 10 days (with discharge 04/19/17)  Discharge activity level: Yes - supervision overall  Home/community participation: Yes  Services provided included: MD, RD, PT, OT, SLP, RN, TR, Pharmacy and [place]: Private Insurance: [place]  Follow-up services arranged: Home Health: PT, OT, ST via [facility] and Patient/Family has no preference for HH/DME agencies  Comments (or additional information):  Patient/Family verbalized understanding of follow-up arrangements: Yes  Individual responsible for coordination of the follow-up plan: pt  Confirmed correct DME delivered: NA - no needs as family has needed DME    Oral Hallgren

## 2017-04-18 NOTE — Discharge Summary (Signed)
Discharge summary job # [id number]

## 2017-04-18 NOTE — Discharge Instructions (Signed)
Inpatient Rehab Discharge Instructions  [name] [name] Discharge date and time: 04/18/17   Activities/Precautions/ Functional Status: Activity: activity as tolerated Diet: cardiac diet Wound Care: keep wound clean and dry   Functional status:  ___ No restrictions     ___ Walk up steps independently _X__ 24/7 supervision/assistance   ___ Walk up steps with assistance ___ Intermittent supervision/assistance  ___ Bathe/dress independently ___ Walk with walker     _x__ Bathe/dress with assistance ___ Walk Independently    ___ Shower independently ___ Walk with assistance    ___ Shower with assistance _X__ No alcohol     ___ Return to work/school ________   COMMUNITY REFERRALS UPON DISCHARGE:   Home Health:   PT     OT      ST  Agency:  [facility]:  [phone]    GENERAL COMMUNITY RESOURCES FOR PATIENT/FAMILY: Support Groups:  [facility] Stroke Support Group                              Meets the 2nd Thursday of the month at [facility] (except June, July, and August)                              On [facility] at [facility] in the dayroom of [place]                              For more information, call [phone]  Special Instructions: No driving [place] Cigarette smoking nearly doubles your risk of having a stroke & is the single most alterable risk factor  If you smoke or have smoked in the last 12 months, you are advised to quit smoking for your health.  Most of the excess cardiovascular risk related to smoking disappears within a year of stopping.  Ask you doctor about anti-smoking medications  [place] Quit Line: 1-800-QUIT NOW  Free Smoking Cessation Classes [phone]  CHOLESTEROL Know your levels; limit fat & cholesterol in your diet  Lipid Panel     Component Value Date/Time   CHOL 211 (H) 04/02/2017 0814   TRIG 59 04/02/2017 0814   HDL 69 04/02/2017 0814   CHOLHDL 3.1 04/02/2017 0814   VLDL  12 04/02/2017 0814   LDLCALC 130 (H) 04/02/2017 0814      Many patients benefit from treatment even if their cholesterol is at goal.  Goal: Total Cholesterol (CHOL) less than 160  Goal:  Triglycerides (TRIG) less than 150  Goal:  HDL greater than 40  Goal:  LDL (LDLCALC) less than 100   BLOOD PRESSURE American Stroke Association blood pressure target is less that 120/80 mm/Hg  Your discharge blood pressure is:  BP: (!) 160/79  Monitor your blood pressure  Limit your salt and alcohol intake  Many individuals will require more than one medication for high blood pressure  DIABETES (A1c is a blood sugar average for last 3 months) Goal HGBA1c is under 7% (HBGA1c is blood sugar average for last 3 months)  Diabetes: No known diagnosis of diabetes    Lab Results  Component Value Date   HGBA1C 5.7 (H) 04/02/2017     Your HGBA1c can be lowered with medications, healthy diet, and exercise.  Check your blood sugar as directed by your physician  Call your physician  if you experience unexplained or low blood sugars.  PHYSICAL ACTIVITY/REHABILITATION Goal is 30 minutes at least 4 days per week  Activity: Increase activity slowly, and No driving, Therapies: Physical Therapy: Home Health Return to work:   Activity decreases your risk of heart attack and stroke and makes your heart stronger.  It helps control your weight and blood pressure; helps you relax and can improve your mood.  Participate in a regular exercise program.  Talk with your doctor about the best form of exercise for you (dancing, walking, swimming, cycling).  DIET/WEIGHT Goal is to maintain a healthy weight  Your discharge diet is: Diet Heart Room service appropriate? Yes; Fluid consistency: Thin  liquids Your height is:  Height: 5\' 6"  (167.6 cm) Your current weight is: Weight: 71.2 kg (156 lb 14.4 oz) Your Body Mass Index (BMI) is:  BMI (Calculated): 24.7  Following the type of diet specifically designed for you  will help prevent another stroke.  You are at goal weight     Your goal Body Mass Index (BMI) is 19-24.  Healthy food habits can help reduce 3 risk factors for stroke:  High cholesterol, hypertension, and excess weight.  RESOURCES Stroke/Support Group:  Call [phone]   STROKE EDUCATION PROVIDED/REVIEWED AND GIVEN TO PATIENT Stroke warning signs and symptoms How to activate emergency medical system (call 911). Medications prescribed at discharge. Need for follow-up after discharge. Personal risk factors for stroke. Pneumonia vaccine given:  Flu vaccine given:  My questions have been answered, the writing is legible, and I understand these instructions.  I will adhere to these goals & educational materials that have been provided to me after my discharge from the hospital.      My questions have been answered and I understand these instructions. I will adhere to these goals and the provided educational materials after my discharge from the hospital.  Patient/Caregiver Signature _______________________________ Date __________  Clinician Signature _______________________________________ Date __________  Please bring this form and your medication list with you to all your follow-up doctor's appointments.

## 2017-04-18 NOTE — Plan of Care (Signed)
Problem: RH Bathing Goal: LTG Patient will bathe with assist, cues/equipment (OT) LTG: Patient will bathe specified number of body parts with assist with/without cues using equipment (position)  (OT)  Outcome: Adequate for Discharge Pt requiring MinA to steady during sit to stand for washing buttocks, perineal region. Pt daughter present during session and able to offer steady assist needed to complete task. Also educated Pt to perform lateral leans to complete task (versus standing) to increase safety.

## 2017-04-18 NOTE — Progress Notes (Signed)
[facility]IC[facility] & REHABILITATION     PROGRESS NOTE    Subjective/Complaints: Sitting EOB eating breakfast. Utilizing her right hand to help with feeding. No compaints  ROS: pt denies nausea, vomiting, diarrhea, cough, shortness of breath or chest pain     Objective: Vital Signs: Blood pressure (!) 160/79, pulse 85, temperature 98.9 F (37.2 C), temperature source Oral, resp. rate 14, height 5\' 6"  (1.676 m), weight 71.2 kg (156 lb 14.4 oz), SpO2 100 %. No results found. No results for input(s): WBC, HGB, HCT, PLT in the last 72 hours. No results for input(s): NA, K, CL, GLUCOSE, BUN, CREATININE, CALCIUM in the last 72 hours.  Invalid input(s): CO CBG (last 3)  No results for input(s): GLUCAP in the last 72 hours.  Wt Readings from Last 3 Encounters:  04/17/17 71.2 kg (156 lb 14.4 oz)  04/01/17 63.5 kg (140 lb)    Physical Exam:  Constitutional: She is oriented to person, place, and time. She appears well-developed and well-nourished.  HENT:  Head: Normocephalic.  Neck CDI Eyes: EOM are normal. Right eye exhibits no discharge. Left eye exhibits no discharge.  Neck: Normal range of motion. Neck supple. No thyromegaly present.  Cardiovascular: RRR   Respiratory: CTA B GI: Soft. Bowel sounds are normal. She exhibits no distension.  Musculoskeletal: She exhibits edema and tenderness.  Neurological: She is alert and oriented to person, place, and time. Displays reasonable good insight and awareness Motor: Right shoulder abduction 2+/5, elbow flex/ext   2+/5, HI 2+/5. Good sitting balance RLE: 4/5 proximal to distal LUE/LLE: 5/5 proximal to distal- DTRs symmetric  Skin:  Neck incision c/d/i  Psychiatric: She has a normal mood and affect. Her behavior is normal.   Assessment/Plan: 1. 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 Physiatrist is providing close team supervision and 24 hour management of active medical problems listed below. Physiatrist and rehab team continue to assess barriers to discharge/monitor patient progress toward functional and medical goals.  Function:  Bathing Bathing position   Position: Standing at sink  Bathing parts Body parts bathed by patient: Right arm, Left arm, Chest, Abdomen, Front perineal area, Buttocks Body parts bathed by helper: Back  Bathing assist Assist Level: Touching or steadying assistance(Pt > 75%)      Upper Body Dressing/Undressing Upper body dressing   What is the patient wearing?: Bra, Button up shirt Bra - Perfomed by patient: Thread/unthread right bra strap, Thread/unthread left bra strap, Hook/unhook bra (pull down sports bra)   Pull over shirt/dress - Perfomed by patient: Thread/unthread right sleeve, Thread/unthread left sleeve, Put head through opening, Pull shirt over trunk Pull over shirt/dress - Perfomed by helper: Thread/unthread right sleeve, Put head through opening, Pull shirt over trunk Button up shirt - Perfomed by patient: Thread/unthread right sleeve, Thread/unthread left sleeve, Pull shirt around back, Button/unbutton shirt Button up shirt - Perfomed by helper: Button/unbutton shirt    Upper body assist Assist Level: Touching or steadying assistance(Pt > 75%) (verbal cues and multiple trials to correctly sequence threading sleeves and pulling shirt around back)   Set up : To obtain clothing/put away  Lower Body Dressing/Undressing Lower body dressing   What is the patient wearing?: Underwear, Pants, Non-skid slipper socks, Ted Hose Underwear - Performed by patient: Thread/unthread right underwear leg, Thread/unthread left underwear leg, Pull underwear up/down Underwear - Performed by helper: Pull underwear up/down Pants- Performed  by patient: Thread/unthread left pants leg, Pull pants up/down, Thread/unthread right pants  leg Pants- Performed by helper: Thread/unthread right pants leg Non-skid slipper socks- Performed by patient: Don/doff right sock, Don/doff left sock Non-skid slipper socks- Performed by helper: Don/doff right sock Socks - Performed by patient: Don/doff right sock, Don/doff left sock   Shoes - Performed by patient: Don/doff right shoe, Don/doff left shoe         TED Hose - Performed by helper: Don/doff right TED hose, Don/doff left TED hose  Lower body assist Assist for lower body dressing: Touching or steadying assistance (Pt > 75%)      Toileting Toileting   Toileting steps completed by patient: Adjust clothing prior to toileting, Performs perineal hygiene Toileting steps completed by helper: Performs perineal hygiene, Adjust clothing after toileting Toileting Assistive Devices: Grab bar or rail  Toileting assist Assist level: Touching or steadying assistance (Pt.75%)   Transfers Chair/bed transfer   Chair/bed transfer method: Ambulatory Chair/bed transfer assist level: Supervision or verbal cues Chair/bed transfer assistive device: Armrests     Locomotion Ambulation     Max distance: 150 ft Assist level: Supervision or verbal cues   Wheelchair     Max wheelchair distance:  (150 ft) Assist Level: Maximal assistance (Pt 25 - 49%)  Cognition Comprehension Comprehension assist level: Understands complex 90% of the time/cues 10% of the time  Expression Expression assist level: Expresses basic needs/ideas: With no assist  Social Interaction Social Interaction assist level: Interacts appropriately with others with medication or extra time (anti-anxiety, antidepressant).  Problem Solving Problem solving assist level: Solves basic 75 - 89% of the time/requires cueing 10 - 24% of the time  Memory Memory assist level: Recognizes or recalls 75 - 89% of the time/requires cueing 10 - 24% of the time   Medical Problem List and Plan: 1.  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  -continue PT, OT, SLP   -dc 7/9 2.  DVT Prophylaxis/Anticoagulation: Subcutaneous Lovenox. Monitor for any bleeding episodes. Venous Doppler studies negative 3. Pain Management: Oxycodone as needed--controlled at present 4. Mood: Provide emotional support  -trazodone prn hs 5. Neuropsych: This patient is not fully capable of making decisions on her own behalf. 6. Skin/Wound Care: Routine skin checks 7. Fluids/Electrolytes/Nutrition: encourage po    8. Left carotid stenosis. Status post left CEA 04/08/2017. Incision site clean 9. Hypertension. Norvasc 2.5 mg daily.   -fair control at present 10. Hyperlipidemia. Lipitor   LOS (Days) 9 A FACE TO FACE EVALUATION WAS PERFORMED  Alger Simons T, MD 04/18/2017 8:46 AM    [facility]ICAL MEDICINE & REHABILITATION     PROGRESS NOTE    Subjective/Complaints: No complaints and no problems reported  ROS: pt denies nausea, vomiting, diarrhea, cough, shortness of breath or chest pain    Objective: Vital Signs: Blood pressure (!) 160/79, pulse 85, temperature 98.9 F (37.2 C), temperature source Oral, resp. rate 14, height 5\' 6"  (1.676 m), weight 71.2 kg (156 lb 14.4 oz), SpO2 100 %. No results found. No results for input(s): WBC, HGB, HCT, PLT in the last 72 hours. No results for input(s): NA, K, CL, GLUCOSE, BUN, CREATININE, CALCIUM in the last 72 hours.  Invalid input(s): CO CBG (last 3)  No results for input(s): GLUCAP in the last 72 hours.  Wt Readings from Last 3 Encounters:  04/17/17 71.2 kg (156 lb 14.4 oz)  04/01/17 63.5 kg (140 lb)    Physical Exam:  Constitutional: She  is oriented to person, place, and time. She appears well-developed and well-nourished.  HENT:  Head: Normocephalic.  Neck CDI Eyes: EOM are normal. Right eye exhibits no discharge. Left eye exhibits no discharge.  Neck: Normal range of motion. Neck supple. No thyromegaly present.  Cardiovascular: RRR    Respiratory: CTA B GI: Soft. Bowel sounds are normal. She exhibits no distension.  Musculoskeletal: She exhibits edema and tenderness.  Neurological: She is alert and oriented to person, place, and time. Displays reasonable good insight and awareness Motor: Right shoulder abduction 2/5, elbow flex/ext   2/5, distally 2/5. Good sitting balance RLE: 4/5 proximal to distal--no changes LUE/LLE: 5/5 proximal to distal- DTRs symmetric  Skin:  Neck incision c/d/i  Psychiatric: She has a normal mood and affect. Her behavior is normal.   Assessment/Plan: 1. 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 Physiatrist is providing close team supervision and 24 hour management of active medical problems listed below. Physiatrist and rehab team continue to assess barriers to discharge/monitor patient progress toward functional and medical goals.  Function:  Bathing Bathing position   Position: Standing at sink  Bathing parts Body parts bathed by patient: Right arm, Left arm, Chest, Abdomen, Front perineal area, Buttocks Body parts bathed by helper: Back  Bathing assist Assist Level: Touching or steadying assistance(Pt > 75%)      Upper Body Dressing/Undressing Upper body dressing   What is the patient wearing?: Bra, Button up shirt Bra - Perfomed by patient: Thread/unthread right bra strap, Thread/unthread left bra strap, Hook/unhook bra (pull down sports bra)   Pull over shirt/dress - Perfomed by patient: Thread/unthread right sleeve, Thread/unthread left sleeve, Put head through opening, Pull shirt over trunk Pull over shirt/dress - Perfomed by helper: Thread/unthread right sleeve, Put head through opening, Pull shirt over trunk Button up shirt - Perfomed by patient: Thread/unthread right sleeve, Thread/unthread left sleeve, Pull shirt around back,  Button/unbutton shirt Button up shirt - Perfomed by helper: Button/unbutton shirt    Upper body assist Assist Level: Touching or steadying assistance(Pt > 75%) (verbal cues and multiple trials to correctly sequence threading sleeves and pulling shirt around back)   Set up : To obtain clothing/put away  Lower Body Dressing/Undressing Lower body dressing   What is the patient wearing?: Underwear, Pants, Non-skid slipper socks, Ted Hose Underwear - Performed by patient: Thread/unthread right underwear leg, Thread/unthread left underwear leg, Pull underwear up/down Underwear - Performed by helper: Pull underwear up/down Pants- Performed by patient: Thread/unthread left pants leg, Pull pants up/down, Thread/unthread right pants leg Pants- Performed by helper: Thread/unthread right pants leg Non-skid slipper socks- Performed by patient: Don/doff right sock, Don/doff left sock Non-skid slipper socks- Performed by helper: Don/doff right sock Socks - Performed by patient: Don/doff right sock, Don/doff left sock   Shoes - Performed by patient: Don/doff right shoe, Don/doff left shoe         TED Hose - Performed by helper: Don/doff right TED hose, Don/doff left TED hose  Lower body assist Assist for lower body dressing: Touching or steadying assistance (Pt > 75%)      Toileting Toileting   Toileting steps completed by patient: Adjust clothing prior to toileting, Performs perineal hygiene Toileting steps completed by helper: Performs perineal hygiene, Adjust clothing after toileting Toileting Assistive Devices: Grab bar or rail  Toileting assist Assist level: Touching or steadying assistance (Pt.75%)   Transfers Chair/bed transfer  Chair/bed transfer method: Ambulatory Chair/bed transfer assist level: Supervision or verbal cues Chair/bed transfer assistive device: Armrests     Locomotion Ambulation     Max distance: 150 ft Assist level: Supervision or verbal cues   Wheelchair      Max wheelchair distance:  (150 ft) Assist Level: Maximal assistance (Pt 25 - 49%)  Cognition Comprehension Comprehension assist level: Understands complex 90% of the time/cues 10% of the time  Expression Expression assist level: Expresses basic needs/ideas: With no assist  Social Interaction Social Interaction assist level: Interacts appropriately with others with medication or extra time (anti-anxiety, antidepressant).  Problem Solving Problem solving assist level: Solves basic 75 - 89% of the time/requires cueing 10 - 24% of the time  Memory Memory assist level: Recognizes or recalls 75 - 89% of the time/requires cueing 10 - 24% of the time   Medical Problem List and Plan: 1.  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  -continue PT, OT, SLP   -dc 7/9  -Patient to see MD in the office for transitional care encounter in 1-2 weeks. 2.  DVT Prophylaxis/Anticoagulation: Subcutaneous Lovenox. Monitor for any bleeding episodes. Venous Doppler studies negative 3. Pain Management: Oxycodone as needed--controlled at present 4. Mood: Provide emotional support  -trazodone prn hs has helped her 5. Neuropsych: This patient is not fully capable of making decisions on her own behalf. 6. Skin/Wound Care: Routine skin checks 7. Fluids/Electrolytes/Nutrition: encourage po    8. Left carotid stenosis. Status post left CEA 04/08/2017. Incision site clean  -advised pt to keep area clean and dry 9. Hypertension. Norvasc 2.5 mg daily.   -fair control at present 10. Hyperlipidemia. Lipitor   LOS (Days) 9 A FACE TO FACE EVALUATION WAS PERFORMED  [name], MD 04/18/2017 8:46 AM

## 2017-04-18 NOTE — Plan of Care (Signed)
Problem: RH Ambulation Goal: LTG Patient will ambulate in controlled environment (PT) LTG: Patient will ambulate in a controlled environment, # of feet with assistance (PT).  Outcome: Completed/Met Date Met: 04/18/17 150 ft without AD Goal: LTG Patient will ambulate in home environment (PT) LTG: Patient will ambulate in home environment, # of feet with assistance (PT).  Outcome: Completed/Met Date Met: 04/18/17 50 ft without AD  Problem: RH Wheelchair Mobility Goal: LTG Patient will propel w/c in controlled environment (PT) LTG: Patient will propel wheelchair in controlled environment, # of feet with assist (PT)  Outcome: Adequate for Discharge Pt to d/c at ambulatory level  Problem: RH Stairs Goal: LTG Patient will ambulate up and down stairs w/assist (PT) LTG: Patient will ambulate up and down # of stairs with assistance (PT)  Outcome: Completed/Met Date Met: 04/18/17 12 steps with 1 rail

## 2017-04-18 NOTE — Progress Notes (Signed)
Physical Therapy Discharge Summary  Patient Details  Name: [name] [name] MRN: [id number] Date of Birth: 1946/05/09  Today's Date: 04/18/2017 PT Individual Time: 0086-7619 PT Individual Time Calculation (min): 62 min    Patient has met 10 of 10 long term goals due to improved activity tolerance, improved balance, improved postural control, increased strength, ability to compensate for deficits, functional use of  right upper extremity and right lower extremity, improved attention, improved awareness and improved coordination.  Patient to discharge at an ambulatory level Supervision without AD, min assist stair negotiation with 1 rail.  Patient's daughter [name]) participated in hands on caregiver training and both report comfort regarding pt's assistance level & d/c home.   Reasons goals not met: N/A  Recommendation:  Patient will benefit from ongoing skilled PT services in home health setting to continue to advance safe functional mobility, address ongoing impairments in RUE/LE NMR, dynamic balance, gait, stair negotiation, R attention, safety awareness, problem solving, recall/memory, and minimize fall risk.  Equipment: No equipment provided  Reasons for discharge: treatment goals met  Patient/family agrees with progress made and goals achieved: Yes  Skilled PT Treatment: Pt received in room with daughter [name]) present for caregiver education. Session focused on caregiver training with therapist providing demo & [name] return demonstrating. [name] reports pt will d/c home to her house which has a single step without rails to enter and a full flight of stairs with B rails to access 2nd level of home. Therapist provided [name] with education and cuing regarding positioning of herself in relation to patient when pt's ambulating and negotiating stairs. Pt negotiated a full flight of stairs with L rail and min assist; pt does not require cuing but instead extra time to self-correct step  pattern. Pt ambulates over even surface & ramp with supervision, uneven surface with min assist, and completes bed mobility & transfers (sit<>stand, car from sedan simulated height) with supervision. Pt continues to require cuing for increased anterior weight shift to assist with sit<>stand transfers. Discussed HHPT recommendations, home modifications & fall risks, and need for 24 hr supervision with pt & [name] reporting understanding. Provided pt & [name] with energy conservation handout & discussed various methods for pt to use at home. Also educated them on pt's R inattention. At end of session pt left in recliner in room with daughter present to supervise.   PT Discharge Precautions/Restrictions Precautions Precautions: Fall Precaution Comments: R inattention, L carotid endarterectomy on 04/08/17 Restrictions Weight Bearing Restrictions: No  Pain Pain Assessment Pain Assessment: No/denies pain  Vision/Perception  Perception Perception: Impaired Inattention/Neglect: Does not attend to right visual field   Cognition Overall Cognitive Status: Impaired/Different from baseline Arousal/Alertness: Awake/alert Orientation Level: Oriented X4 Memory: Impaired Problem Solving: Impaired Safety/Judgment: Impaired  Sensation Gross Motor Movements are Fluid and Coordinated: No Fine Motor Movements are Fluid and Coordinated:  (impaired RUE)  Motor  Motor Motor: Hemiplegia (R hemiplegia, general weakness & decresaed endurance)   Mobility Bed Mobility Bed Mobility: Supine to Sit;Sit to Supine Supine to Sit: 5: Supervision Sit to Supine: 5: Supervision Transfers Transfers: Yes Sit to Stand: 5: Supervision Sit to Stand Details: Verbal cues for technique Stand to Sit: 5: Supervision Stand to Sit Details (indicate cue type and reason): Verbal cues for technique  Locomotion  Ambulation Ambulation: Yes Ambulation/Gait Assistance: 5: Supervision Ambulation Distance (Feet): 150  Feet Assistive device: None Ambulation/Gait Assistance Details: Verbal cues for technique Gait Gait: Yes Gait Pattern: Decreased weight shift to right;Decreased step length - left;Decreased step length -  right (decresaed gait speed (pt & daughter report this is baseline)) Stairs / Additional Locomotion Stairs: Yes Stairs Assistance: 4: Min assist Stair Management Technique: One rail Left Number of Stairs: 12 Height of Stairs: 6 (inches) Ramp: 5: Supervision Curb: 4: Min Chemical engineer: No   Balance Balance Balance Assessed: Yes Dynamic Standing Balance Dynamic Standing - Level of Assistance: Supervision (during gait)  Extremity Assessment  RLE Assessment RLE Assessment: Within Functional Limits (hemiplegia) LLE Assessment LLE Assessment: Within Functional Limits   See Function Navigator for Current Functional Status.  Waunita Schooner 04/18/2017, 6:38 PM

## 2017-04-18 NOTE — Progress Notes (Signed)
Speech Language Pathology Session Note & Discharge Summary  Patient Details  Name: [name] [name] MRN: [id number] Date of Birth: 04/20/1946  Today's Date: 04/18/2017 SLP Individual Time: 1115-1200 SLP Individual Time Calculation (min): 45 min   Skilled Therapeutic Interventions:  Skilled treatment session focused on completion of patient and family education with the patient's daughter. Both were educated on patient's current cognitive-linguistic functioning and strategies to utilize at home to maximize word-finding, problem solving and overall safety with functional and familiar tasks. Both were also educated on the importance of 24 hour supervision. All verbalized understanding and agreement.     Patient has met 2 of 2 long term goals.  Patient to discharge at overall Supervision;Min level.   Reasons goals not met: N/A   Clinical Impression/Discharge Summary: Patient has made excellent gains and has met 2 of 2 LTG's this reporting period. Currently, patient is overall Mod I for verbal expression of complex wants/needs and requires Min A verbal cues for recall of functional information and for mildly complex problem solving. Patient and family education is complete and patient will discharge home with 24 hour supervision from family. Patient would benefit from f/u home health SLP services to maximize his cognitive function and reduce caregiver burden.   Care Partner:  Caregiver Able to Provide Assistance: Yes  Type of Caregiver Assistance: Physical;Cognitive  Recommendation:  24 hour supervision/assistance;Home Health SLP  Rationale for SLP Follow Up: Maximize cognitive function and independence;Reduce caregiver burden   Equipment: N/A   Reasons for discharge: Treatment goals met;Discharged from hospital   Patient/Family Agrees with Progress Made and Goals Achieved: Yes   Function:  Cognition Comprehension Comprehension assist level: Understands complex 90% of the time/cues 10% of  the time  Expression   Expression assist level: Expresses basic needs/ideas: With no assist  Social Interaction Social Interaction assist level: Interacts appropriately with others with medication or extra time (anti-anxiety, antidepressant).  Problem Solving Problem solving assist level: Solves basic 90% of the time/requires cueing < 10% of the time  Memory Memory assist level: Recognizes or recalls 75 - 89% of the time/requires cueing 10 - 24% of the time   Gavyn Ybarra 04/18/2017, 3:20 PM

## 2017-04-18 NOTE — Progress Notes (Signed)
Occupational Therapy Discharge Summary  Patient Details  Name: [name] [name] MRN: [id number] Date of Birth: December 25, 1945  Today's Date: 04/18/2017 OT Individual Time: 1497-0263 OT Individual Time Calculation (min): 73 min   Focus of session on completing ADLs, simple IADLs, and family education for safe discharge home. Pt completing morning ADLs during session with close supervision for bathing seated in shower, toileting, UB/LB dressing sitting EOB, and set up for seated grooming. Pt does require MinA to steady for sit to stand in shower to wash buttocks secondary to navigating wet surface. Educated Pt and caregiver on techniques and compensatory strategies for increasing safety during bathing task completion at home including using lateral leans (vs sit to stand) and wearing non slip socks/using non slip bath mat in tub. Pt completed room and hallway level functional mobility to rehab aparment with close supervision for safety. Pt navigates bedroom and kitchen, collecting and utilizing kitchen materials with supervision. Educating Pt and daughter on safe transfer technique for tub bench and 3:1 BSC with Pt demonstrating with supervision. Pt returned to room where OT issued/reviewed theraputty and fine motor HEP handout for use at home to continue to increase functional use of R UE. Education provided throughout and all questions answered with both Pt and [name]. Ended session in Pt room, Pt sitting up in recliner, daughter present, call bell and needs in reach, SLP arriving to room.    Patient has met 8 of 10 long term goals due to improved activity tolerance, improved balance, postural control, ability to compensate for deficits, functional use of  RIGHT upper extremity, improved attention, improved awareness and improved coordination.  Patient to discharge at overall Supervision level.  Patient's care partner (daughter) is independent to provide the necessary physical and cognitive assistance at  discharge.    Reasons goals not met: Pt intermittently requires MinA to steady during sit to stand transfer with bathing, with daughter able to provide necessary assist after discharge home. Pt demonstrates ability to complete sit to stand transfers with supervision, however appears more cautious when in shower due to standing on wet surface, therefore requiring steady assist. Pt continues to require intermittent  Verbal cues to attend to R side, however has demonstrated significant improvements since initial evaluation.   Recommendation:  Patient will benefit from ongoing skilled OT services in home health setting to continue to advance functional skills in the area of BADL, iADL, Vocation and Reduce care partner burden.  Equipment: No equipment provided; Pt has needed equipment at home.   Reasons for discharge: treatment goals met  Patient/family agrees with progress made and goals achieved: Yes  OT Discharge Precautions/Restrictions  Precautions Precautions: Fall Restrictions Weight Bearing Restrictions: No   Pain Pain Assessment Pain Assessment: Faces Faces Pain Scale: No hurt ADL ADL ADL Comments: See Functional Navigator  Vision Baseline Vision/History: Wears glasses Wears Glasses: Distance only Patient Visual Report: No change from baseline Vision Assessment?: Yes Eye Alignment: Within Functional Limits Alignment/Gaze Preference: Within Defined Limits Additional Comments: Improvements in R inattention noted with min deficits remaining  Perception  Perception:  (Continues to demonstrate min R inattention, though much improved from initial eval) Praxis Praxis: Impaired Praxis Impairment Details: Motor planning (minimally, significant improvements noted from initial eval ) Cognition Overall Cognitive Status: Impaired/Different from baseline Arousal/Alertness: Awake/alert Orientation Level: Oriented X4 Attention: Alternating Sustained Attention: Appears  intact Selective Attention: Appears intact Divided Attention: Impaired Divided Attention Impairment: Verbal complex;Functional complex Memory: Impaired Memory Impairment: Decreased short term memory;Decreased recall of new information Decreased  Short Term Memory: Verbal basic;Functional basic Awareness: Appears intact Problem Solving: Impaired Problem Solving Impairment: Functional complex Executive Function: Reasoning Reasoning: Impaired Reasoning Impairment: Functional complex Sequencing: Appears intact Organizing: Appears intact Decision Making: Impaired Decision Making Impairment: Verbal complex Initiating: Impaired Initiating Impairment: Functional complex Safety/Judgment: Appears intact Sensation Sensation Light Touch: Impaired by gross assessment Additional Comments: Pt continues to require MinA to adjust water temperature  Coordination Gross Motor Movements are Fluid and Coordinated: No Fine Motor Movements are Fluid and Coordinated: No Coordination and Movement Description: in RUE  Motor  Motor Motor: Other (comment) (hemiparesis in R UE ) Motor - Discharge Observations: Posterior pelvic tilt still present however Pt able to self correct during sit to stand transfers; increased funcitonal use of R UE  Mobility  Transfers Transfers: Sit to Stand;Stand to Sit Sit to Stand: 5: Supervision Sit to Stand Details: Verbal cues for technique Stand to Sit: 5: Supervision Stand to Sit Details (indicate cue type and reason): Verbal cues for technique Stand to Sit Details: Pt correctly places R UE during sit to/from stand transfers with intermittent verbal cues   Trunk/Postural Assessment  Cervical Assessment Cervical Assessment: Within Functional Limits Thoracic Assessment Thoracic Assessment: Within Functional Limits Postural Control Postural Control: Deficits on evaluation Trunk Control: improvements in posterior pelvic tilt; Pt able to self correct during sit to stand  transfer  Righting Reactions: improvements in righting reaction; Pt able to self-correct during mobility   Balance Balance Balance Assessed: Yes Static Sitting Balance Static Sitting - Balance Support: Feet supported Static Sitting - Level of Assistance: 7: Independent Dynamic Sitting Balance Dynamic Sitting - Balance Support: Feet unsupported Dynamic Sitting - Level of Assistance: 7: Independent Static Standing Balance Static Standing - Balance Support: No upper extremity supported Static Standing - Level of Assistance: 7: Independent Dynamic Standing Balance Dynamic Standing - Level of Assistance: 5: Stand by assistance Extremity/Trunk Assessment RUE Assessment RUE Assessment: Exceptions to [facility] RUE Strength RUE Overall Strength Comments: increased AROM and strenth in R UE; Pt with increased ease for reaching across all planes during functional tasks with R UE, continues to demonstrate deficits in R grasp  LUE Assessment LUE Assessment: Within Functional Limits   See Function Navigator for Current Functional Status.  Raymondo Band 04/18/2017, 4:43 PM

## 2017-04-18 NOTE — Discharge Summary (Signed)
NAME:  [name], [name]               ACCOUNT NO.:  [id number]  MEDICAL RECORD NO.:  [id number]  LOCATION:                                 FACILITY:  PHYSICIAN:  [name] [name], M.D.DATE OF BIRTH:  [birth year]  DATE OF ADMISSION:  04/09/2017 DATE OF DISCHARGE:  04/20/2017                              DISCHARGE SUMMARY   DISCHARGE DIAGNOSES: 1. Left greater than right watershed infarctions. 2. Subcutaneous Lovenox for DVT prophylaxis. 3. Pain management. 4. Left carotid stenosis with carotid endarterectomy on Apr 08, 2017. 5. Hypertension. 6. Hyperlipidemia.  HISTORY OF PRESENT ILLNESS:  This is a 71 year old right-handed female, on no prescription medications, independent prior to admission, living alone.  Presented on Apr 01, 2017, with right-sided weakness, unsteady gait.  CT MRI showed bilateral Karr[place] infarctions, patchy acute infarcts (left greater than right), cerebral hemispheres with distribution suggesting watershed ischemia.  Advanced [place] vessel ischemic changes. MRA with atherosclerotic-type changes including severe right and moderate left ICA stenosis.  Severe bilateral vertebral artery stenosis. Carotid Dopplers, 1-39% right ICA and greater than 80% left ICA stenosis.  MRI of cervical spine showed mild cervical disk degeneration. MRI of the chest negative.  She did not receive tPA.  Echocardiogram with ejection fraction of 72%, grade 1 diastolic dysfunction.  There was a plan for TEE, possible loop recorder, however, the patient declined procedure.  Neurology consulted, maintained on aspirin and Plavix for CVA prophylaxis.  Subcutaneous Lovenox for DVT prophylaxis.  Vascular Surgery followup in regard to ICA stenosis.  Underwent left carotid endarterectomy on Apr 08, 2017, per Dr. Servando Snare.  Physical and occupational therapy ongoing.  The patient was admitted for a comprehensive rehab program.  PAST MEDICAL HISTORY:  See discharge diagnoses.  SOCIAL HISTORY:   Independent living alone prior to admission.  FUNCTIONAL STATUS UPON ADMISSION TO REHAB SERVICES:  Minimal assist 100 feet one-person handheld assistance, minimal assist stand pivot transfers, min to mod assist activities daily living.  PHYSICAL EXAMINATION:  VITAL SIGNS:  Blood pressure 111/61, pulse 73, temperature 99, respirations 13. GENERAL:  This was an alert female, in no acute distress, oriented x3. HEENT:  EOMs intact. NECK:  Supple, nontender.  No JVD. CARDIAC:  Regular rate and rhythm. No murmur. ABDOMEN:  Soft, nontender.  Good bowel sounds. LUNGS:  Clear to auscultation without wheeze.  Carotid surgery site clean and dry.  REHABILITATION HOSPITAL COURSE:  The patient was admitted to Inpatient Rehab Services with therapies initiated on a 3-hour daily basis consisting of physical therapy, occupational therapy, and rehabilitation nursing.  The following issues were addressed during the patient's rehabilitation stay.  Pertaining to Mrs. Innes's watershed infarctions, remained stable, maintained on aspirin and Plavix therapy, she would follow up with neurology Services.  Subcutaneous Lovenox for DVT prophylaxis.  No bleeding episodes.  Pain management, she was using oxycodone as needed on a limited basis.  Blood pressures monitored on  Norvasc with some elevated variations increased to 10 mg daily and the addition of lisinopril.  She would follow up with her primary MD for close observation and management.  Lipitor ongoing for hyperlipidemia.  The patient received weekly collaborative interdisciplinary team conferences to discuss estimated length of  stay, family teaching, any barriers to her discharge.  The patient ambulated throughout the unit without assistive device and supervision with occasional cuing for obstacle avoidance on the right, negotiate with flight of stairs, mod to max assist on first trial, moderate assist for second trial, perform toe taps independently,  perform sit-to-stand transfers without bilateral upper extremities with tasks focusing on anterior weight shifting and supervision.  She could gather her belongings for activities of daily living and homemaking.  She was able to communicate her needs.  Full family teaching was completed and discharged to home.  DISCHARGE MEDICATIONS: 1. Norvasc 10 mg p.o. daily. 2. Aspirin 325 mg p.o. daily. 3. Lipitor 80 mg p.o. daily. 4. Plavix 75 mg p.o. daily. 5. Protonix 40 mg p.o. daily. 6. Oxycodone 1-2 tablets every 4 hours as needed pain. 7. Lisinopril 5 mg daily DIET:  Her diet was regular.  FOLLOWUP:  She would follow up with Dr. Alger Simons at the Outpatient Rehab Service office as directed; Dr. Erlinda Hong, Neurology Services, call for appointment in 1 month; Dr. Servando Snare, Vascular Surgery; Dr. Neoma Laming Dr. Mellody Dance, medical management as well as management of hypertension.  SPECIAL INSTRUCTIONS:  No driving.     [name] [name], P.A.   ______________________________ [name] [name], M.D.    DA/MEDQ  D:  04/18/2017  T:  04/18/2017  Job:  [id number]  cc:   Dr. Erlinda Hong Dr. Precious Reel Dr. Cheral Marker, M.D.

## 2017-04-19 DIAGNOSIS — I169 Hypertensive crisis, unspecified: Secondary | ICD-10-CM

## 2017-04-19 MED ORDER — LISINOPRIL 5 MG PO TABS
5.0000 mg | ORAL_TABLET | Freq: Every day | ORAL | Status: DC
  Administered 2017-04-20: 5 mg via ORAL
  Filled 2017-04-19: qty 1

## 2017-04-19 MED ORDER — LISINOPRIL 5 MG PO TABS
5.0000 mg | ORAL_TABLET | Freq: Once | ORAL | Status: AC
  Administered 2017-04-19: 5 mg via ORAL
  Filled 2017-04-19: qty 1

## 2017-04-19 NOTE — Progress Notes (Signed)
04/19/17 1507 nursing Daughter expressed concern re: patient's blood pressure and was asking to keep her mother overnight to monitor blood pressure; she claims she does not have a blood pressure machine at home.Dr. Posey Pronto notified of blood pressure recheck 155/57 and he said patient can stay overnight for blood pressure monitoring.

## 2017-04-19 NOTE — Progress Notes (Signed)
[place] PHYSICAL MEDICINE & REHABILITATION     PROGRESS NOTE    Subjective/Complaints: Pt seen laying in bed this AM.  She slept well overnight.  She is ready for discharge.   ROS: Denies CP, SOB, N/V/D.  Objective: Vital Signs: Blood pressure (!) 150/59, pulse 79, temperature 98.8 F (37.1 C), temperature source Oral, resp. rate 16, height 5\' 6"  (1.676 m), weight 71.2 kg (156 lb 14.4 oz), SpO2 98 %. No results found. No results for input(s): WBC, HGB, HCT, PLT in the last 72 hours. No results for input(s): NA, K, CL, GLUCOSE, BUN, CREATININE, CALCIUM in the last 72 hours.  Invalid input(s): CO CBG (last 3)  No results for input(s): GLUCAP in the last 72 hours.  Wt Readings from Last 3 Encounters:  04/17/17 71.2 kg (156 lb 14.4 oz)  04/01/17 63.5 kg (140 lb)    Physical Exam:  Constitutional: She appears well-developed and well-nourished. NAD.  HENT: Normocephalic. Neck CDI Eyes: EOMI. No discharge.  Cardiovascular: RRR. No JVD. Respiratory: CTA B. Unlabored. GI: Soft. Bowel sounds are normal.  Musculoskeletal: She exhibits edema and tenderness.  Neurological: She is alert and oriented Motor:  Motor: Right shoulder abduction 2+/5, elbow flex/ext   2+/5, HI 2+/5 (improving).  RLE: 4/5 proximal to distal LUE/LLE: 5/5 proximal to distal Skin: Neck incision c/d/i  Psychiatric: She has a normal mood and affect. Her behavior is normal.   Assessment/Plan: 1. 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 Physiatrist is providing close team supervision and 24 hour management of active medical problems listed below. Physiatrist and rehab team continue to assess barriers to discharge/monitor patient progress toward functional and medical goals.  Function:  Bathing Bathing position   Position: Shower  Bathing parts Body parts bathed by  patient: Right arm, Left arm, Chest, Abdomen, Front perineal area, Buttocks, Right upper leg, Left upper leg, Right lower leg, Left lower leg Body parts bathed by helper: Back  Bathing assist Assist Level: Touching or steadying assistance(Pt > 75%)      Upper Body Dressing/Undressing Upper body dressing   What is the patient wearing?: Bra, Pull over shirt/dress Bra - Perfomed by patient: Thread/unthread right bra strap, Thread/unthread left bra strap, Hook/unhook bra (pull down sports bra)   Pull over shirt/dress - Perfomed by patient: Thread/unthread right sleeve, Thread/unthread left sleeve, Put head through opening, Pull shirt over trunk Pull over shirt/dress - Perfomed by helper: Thread/unthread right sleeve, Put head through opening, Pull shirt over trunk Button up shirt - Perfomed by patient: Thread/unthread right sleeve, Thread/unthread left sleeve, Pull shirt around back, Button/unbutton shirt Button up shirt - Perfomed by helper: Button/unbutton shirt    Upper body assist Assist Level: Supervision or verbal cues   Set up : To obtain clothing/put away  Lower Body Dressing/Undressing Lower body dressing   What is the patient wearing?: Underwear, Non-skid slipper socks, Socks, [employer], Pants Underwear - Performed by patient: Thread/unthread right underwear leg, Thread/unthread left underwear leg, Pull underwear up/down Underwear - Performed by helper: Pull underwear up/down Pants- Performed by patient: Thread/unthread left pants leg, Pull pants up/down, Thread/unthread right pants leg Pants- Performed by helper: Thread/unthread right pants leg Non-skid slipper socks- Performed by patient: Don/doff right sock, Don/doff left sock Non-skid slipper socks- Performed by helper: Don/doff right sock Socks - Performed by patient: Don/doff right sock, Don/doff left sock   Shoes - Performed by patient:  Don/doff right shoe, Don/doff left shoe         TED Hose - Performed by helper:  Don/doff right TED hose, Don/doff left TED hose  Lower body assist Assist for lower body dressing: Supervision or verbal cues      Toileting Toileting   Toileting steps completed by patient: Adjust clothing prior to toileting, Performs perineal hygiene, Adjust clothing after toileting Toileting steps completed by helper: Performs perineal hygiene, Adjust clothing after toileting (pt. asked RN to make sure she was clean from [facility]) Toileting Assistive Devices: Grab bar or rail  Toileting assist Assist level: Touching or steadying assistance (Pt.75%)   Transfers Chair/bed transfer   Chair/bed transfer method: Ambulatory Chair/bed transfer assist level: Supervision or verbal cues Chair/bed transfer assistive device: Armrests     Locomotion Ambulation     Max distance: 150 ft Assist level: Supervision or verbal cues   Wheelchair Wheelchair activity did not occur: N/A   Max wheelchair distance:  (150 ft) Assist Level: Maximal assistance (Pt 25 - 49%)  Cognition Comprehension Comprehension assist level: Follows complex conversation/direction with extra time/assistive device  Expression Expression assist level: Expresses complex ideas: With no assist  Social Interaction Social Interaction assist level: Interacts appropriately with others with medication or extra time (anti-anxiety, antidepressant).  Problem Solving Problem solving assist level: Solves complex 90% of the time/cues < 10% of the time  Memory Memory assist level: Recognizes or recalls 90% of the time/requires cueing < 10% of the time   Medical Problem List and Plan: 1.  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  D/c today  -Patient to see MD in the office for transitional care encounter in 1-2 weeks. 2.  DVT Prophylaxis/Anticoagulation: Subcutaneous Lovenox. Monitor for any bleeding episodes. Venous Doppler studies negative 3. Pain Management: Oxycodone as needed, controlled at  present 4. Mood: Provide emotional support  Trazodone prn hs 5. Neuropsych: This patient is not fully capable of making decisions on her own behalf. 6. Skin/Wound Care: Routine skin checks 7. Fluids/Electrolytes/Nutrition: encourage po  8. Left carotid stenosis. Status post left CEA 04/08/2017. Incision site clean 9. Hypertension. Norvasc 2.5 mg daily.   Improving control, will need ambulatory monitoring 10. Hyperlipidemia. Lipitor  LOS (Days) 10 A FACE TO FACE EVALUATION WAS PERFORMED  Ankit Lorie Phenix, MD 04/19/2017 10:57 AM

## 2017-04-19 NOTE — Progress Notes (Addendum)
04/19/17 1315 nursing Daughter expressed concern re: high bp of mother she claims she noticed it yesterday and told PA. Patient for discharge today; RN checked BP manual 190/90 sitting and 186/84 dinamap. MD notified new orders noted.

## 2017-04-19 NOTE — Progress Notes (Addendum)
Brief Note:  Discharge held due to elevated blood pressures with hypertensive crisis and daughter uncomfortable taking patient home.  Informed daughter would add medication and plan for discharge tomorrow AM.

## 2017-04-20 MED ORDER — AMLODIPINE BESYLATE 10 MG PO TABS: 10.0000 mg | ORAL_TABLET | Freq: Every day | ORAL | 0 refills | Status: DC

## 2017-04-20 MED ORDER — LISINOPRIL 5 MG PO TABS: 5.0000 mg | ORAL_TABLET | Freq: Every day | ORAL | 1 refills | Status: DC

## 2017-04-20 MED ORDER — AMLODIPINE BESYLATE 10 MG PO TABS
10.0000 mg | ORAL_TABLET | Freq: Every day | ORAL | Status: DC
  Administered 2017-04-20: 10 mg via ORAL
  Filled 2017-04-20: qty 1

## 2017-04-20 NOTE — Progress Notes (Signed)
Brief Note:  Pt daughter requested to speak with me again regarding blood pressure.  Educated daughter regarding changes to blood pressure medications and they full effects would be not able to assessed immediately.  Encouraged daughter to continue to evaluate reading with blood pressure cuff at home.  If values do not begin to trend down, contact PCP for further adjustments.  If pt has any other signs/symptoms (facial droop, weakness, etc), to seek more urgent medical attention. Overall, blood pressures have improved in the last 24 hours, compared to prior with one higher reading this afternoon.

## 2017-04-20 NOTE — Progress Notes (Signed)
04/20/17 1548 nursing Patient discharged to home per wheelchair accompanied by NT. RN discussed discharge instructions with daughter and materials printed for heart healthy diet. RN answered all daughters inquiries. No further questions at this time.

## 2017-04-20 NOTE — Progress Notes (Signed)
[place] PHYSICAL MEDICINE & REHABILITATION     PROGRESS NOTE    Subjective/Complaints: Pt seen laying in bed this AM.  She slept well overnight and she is anxious to go home.    ROS: Denies CP, SOB, N/V/D.  Objective: Vital Signs: Blood pressure (!) 165/63, pulse 98, temperature 98.3 F (36.8 C), temperature source Oral, resp. rate 18, height 5\' 6"  (1.676 m), weight 71.2 kg (156 lb 14.4 oz), SpO2 100 %. No results found. No results for input(s): WBC, HGB, HCT, PLT in the last 72 hours. No results for input(s): NA, K, CL, GLUCOSE, BUN, CREATININE, CALCIUM in the last 72 hours.  Invalid input(s): CO CBG (last 3)  No results for input(s): GLUCAP in the last 72 hours.  Wt Readings from Last 3 Encounters:  04/17/17 71.2 kg (156 lb 14.4 oz)  04/01/17 63.5 kg (140 lb)    Physical Exam:  Constitutional: She appears well-developed and well-nourished. NAD.  HENT: Normocephalic. Neck CDI Eyes: EOMI. No discharge.  Cardiovascular: RRR. No JVD. Respiratory: CTA B. Unlabored. GI: Soft. Bowel sounds are normal.  Musculoskeletal: She exhibits edema and tenderness.  Neurological: She is alert and oriented Motor:  Motor: Right shoulder abduction 3/5, elbow flex/ext   3/5, HI 3/5.  RLE: 4/5 proximal to distal LUE/LLE: 5/5 proximal to distal Skin: Neck incision c/d/i  Psychiatric: She has a normal mood and affect. Her behavior is normal.   Assessment/Plan: 1. 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 Physiatrist is providing close team supervision and 24 hour management of active medical problems listed below. Physiatrist and rehab team continue to assess barriers to discharge/monitor patient progress toward functional and medical goals.  Function:  Bathing Bathing position   Position: Shower  Bathing parts Body parts bathed by patient: Right  arm, Left arm, Chest, Abdomen, Front perineal area, Buttocks, Right upper leg, Left upper leg, Right lower leg, Left lower leg Body parts bathed by helper: Back  Bathing assist Assist Level: Touching or steadying assistance(Pt > 75%)      Upper Body Dressing/Undressing Upper body dressing   What is the patient wearing?: Bra, Pull over shirt/dress Bra - Perfomed by patient: Thread/unthread right bra strap, Thread/unthread left bra strap, Hook/unhook bra (pull down sports bra)   Pull over shirt/dress - Perfomed by patient: Thread/unthread right sleeve, Thread/unthread left sleeve, Put head through opening, Pull shirt over trunk Pull over shirt/dress - Perfomed by helper: Thread/unthread right sleeve, Put head through opening, Pull shirt over trunk Button up shirt - Perfomed by patient: Thread/unthread right sleeve, Thread/unthread left sleeve, Pull shirt around back, Button/unbutton shirt Button up shirt - Perfomed by helper: Button/unbutton shirt    Upper body assist Assist Level: Supervision or verbal cues   Set up : To obtain clothing/put away  Lower Body Dressing/Undressing Lower body dressing   What is the patient wearing?: Underwear, Non-skid slipper socks, Socks, [employer], Pants Underwear - Performed by patient: Thread/unthread right underwear leg, Thread/unthread left underwear leg, Pull underwear up/down Underwear - Performed by helper: Pull underwear up/down Pants- Performed by patient: Thread/unthread left pants leg, Pull pants up/down, Thread/unthread right pants leg Pants- Performed by helper: Thread/unthread right pants leg Non-skid slipper socks- Performed by patient: Don/doff right sock, Don/doff left sock Non-skid slipper socks- Performed by helper: Don/doff right sock Socks - Performed by patient: Don/doff right sock, Don/doff left sock   Shoes - Performed by  patient: Don/doff right shoe, Don/doff left shoe         TED Hose - Performed by helper: Don/doff right TED  hose, Don/doff left TED hose  Lower body assist Assist for lower body dressing: Supervision or verbal cues      Toileting Toileting   Toileting steps completed by patient: Adjust clothing prior to toileting, Performs perineal hygiene, Adjust clothing after toileting Toileting steps completed by helper: Performs perineal hygiene, Adjust clothing after toileting (pt. asked RN to make sure she was clean from [facility]) Toileting Assistive Devices: Grab bar or rail  Toileting assist Assist level: Touching or steadying assistance (Pt.75%)   Transfers Chair/bed transfer   Chair/bed transfer method: Ambulatory Chair/bed transfer assist level: Supervision or verbal cues Chair/bed transfer assistive device: Armrests     Locomotion Ambulation     Max distance: 150 ft Assist level: Supervision or verbal cues   Wheelchair Wheelchair activity did not occur: N/A   Max wheelchair distance:  (150 ft) Assist Level: Maximal assistance (Pt 25 - 49%)  Cognition Comprehension Comprehension assist level: Follows complex conversation/direction with extra time/assistive device  Expression Expression assist level: Expresses complex ideas: With no assist  Social Interaction Social Interaction assist level: Interacts appropriately with others with medication or extra time (anti-anxiety, antidepressant).  Problem Solving Problem solving assist level: Solves basic 90% of the time/requires cueing < 10% of the time  Memory Memory assist level: Recognizes or recalls 90% of the time/requires cueing < 10% of the time   Medical Problem List and Plan: 1.  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  D/c today.  Pt was supposed to be d/ced yesterday, but daughter uncomfortable taking patient home with elevated blood pressures.  Changes made, attempted to contact pharmacy.  -Patient to see MD in the office for transitional care encounter in 1-2 weeks. 2.  DVT Prophylaxis/Anticoagulation:  Subcutaneous Lovenox. Monitor for any bleeding episodes. Venous Doppler studies negative 3. Pain Management: Oxycodone as needed, controlled at present 4. Mood: Provide emotional support  Trazodone prn hs 5. Neuropsych: This patient is not fully capable of making decisions on her own behalf. 6. Skin/Wound Care: Routine skin checks 7. Fluids/Electrolytes/Nutrition: encourage po  8. Left carotid stenosis. Status post left CEA 04/08/2017. Incision site clean 9. Hypertension. Norvasc increased to 10mg , Lisinopril 5mg  added  Will need ambulatory monitoring 10. Hyperlipidemia. Lipitor   >30 minutes in counseling and coordination of care with patient, family, pharmacy discussing blood pressure and meds as well as need for follow up and ambulatory monitoring  LOS (Days) 11 A FACE TO FACE EVALUATION WAS PERFORMED  Ankit Lorie Phenix, MD 04/20/2017 7:57 AM

## 2017-04-21 ENCOUNTER — Encounter: Payer: Self-pay | Admitting: Vascular Surgery

## 2017-04-29 ENCOUNTER — Telehealth: Payer: Self-pay | Admitting: Family Medicine

## 2017-04-29 NOTE — Telephone Encounter (Signed)
Left message for T[facility] [place] advising we cannot authorize therapy since patient had not been seen by Dr Raliegh Scarlet.

## 2017-04-29 NOTE — Telephone Encounter (Signed)
Jamelle Haring called from [facility] request Verbal Authorization for Speech Therapy for [name] [name] 1 weekly visit for 7 week due to Dx ( stoke )-- Pls call [phone] to speak w/ [facility] provider. --glh

## 2017-04-30 ENCOUNTER — Encounter: Payer: Self-pay | Admitting: Vascular Surgery

## 2017-04-30 ENCOUNTER — Ambulatory Visit (INDEPENDENT_AMBULATORY_CARE_PROVIDER_SITE_OTHER): Payer: Self-pay | Admitting: Vascular Surgery

## 2017-04-30 VITALS — BP 154/79 | HR 102 | Temp 98.4°F | Resp 14 | Ht 66.5 in | Wt 148.0 lb

## 2017-04-30 DIAGNOSIS — I63232 Cerebral infarction due to unspecified occlusion or stenosis of left carotid arteries: Secondary | ICD-10-CM

## 2017-04-30 NOTE — Progress Notes (Signed)
Vitals:   04/30/17 0952 04/30/17 0954  BP: (!) 151/84 (!) 158/80  Pulse: (!) 102 (!) 102  Resp: 14   Temp: 98.4 F (36.9 C)   SpO2: 100%   Weight: 148 lb (67.1 kg)   Height: 5' 6.5" (1.689 m)

## 2017-04-30 NOTE — Progress Notes (Signed)
Subjective:     Patient ID: [name] [name], female   DOB: [birth year], 71 y.o.   MRN: [id number]  HPI  71 year old female follows up from left carotid endarterectomy for symptomatic disease. She had a stroke and was exhibiting right upper extremity paralysis as well as right lower extremity weakness. She now has regained gross function of her right upper extremity and his walking with minimal limitation. She has not had any cranial nerve issues and her wound is healing well.  Review of Systems Depression Rue weakness    Objective:   Physical Exam  [id number] Cranial nerves in tact Right arm strength 4-5    Assessment/plan     71 year old female follows up from left carotid endarterectomy following stroke. She is now out of rehabilitation at home. She was exhibiting minimal depressive symptoms but she is regaining strength of her right upper and lower extremities. She'll follow up in 6 months with repeat carotid duplex.     Jhanvi Drakeford C. Donzetta Matters, MD [facility] Office: [phone] Pager: [phone]

## 2017-05-01 ENCOUNTER — Ambulatory Visit (INDEPENDENT_AMBULATORY_CARE_PROVIDER_SITE_OTHER): Payer: BLUE CROSS/BLUE SHIELD | Admitting: Family Medicine

## 2017-05-01 ENCOUNTER — Encounter: Payer: Self-pay | Admitting: Family Medicine

## 2017-05-01 VITALS — BP 160/77 | HR 93 | Ht 64.0 in | Wt 143.3 lb

## 2017-05-01 DIAGNOSIS — R7302 Impaired glucose tolerance (oral): Secondary | ICD-10-CM

## 2017-05-01 DIAGNOSIS — I10 Essential (primary) hypertension: Secondary | ICD-10-CM

## 2017-05-01 DIAGNOSIS — E782 Mixed hyperlipidemia: Secondary | ICD-10-CM

## 2017-05-01 DIAGNOSIS — F329 Major depressive disorder, single episode, unspecified: Secondary | ICD-10-CM

## 2017-05-01 DIAGNOSIS — I63139 Cerebral infarction due to embolism of unspecified carotid artery: Secondary | ICD-10-CM

## 2017-05-01 DIAGNOSIS — F32A Depression, unspecified: Secondary | ICD-10-CM

## 2017-05-01 NOTE — Addendum Note (Signed)
Addended by: Lianne Cure A on: 05/01/2017 12:21 PM   Modules accepted: Orders

## 2017-05-01 NOTE — Patient Instructions (Addendum)
Please make sure you make your follow-up appointments with cardiology, neurosurgery, neurology, and PM&R.  Hypertension Hypertension, commonly called high blood pressure, is when the force of blood pumping through the arteries is too strong. The arteries are the blood vessels that carry blood from the heart throughout the body. Hypertension forces the heart to work harder to pump blood and may cause arteries to become narrow or stiff. Having untreated or uncontrolled hypertension can cause heart attacks, strokes, kidney disease, and other problems. A blood pressure reading consists of a higher number over a lower number. Ideally, your blood pressure should be below 120/80. The first ("top") number is called the systolic pressure. It is a measure of the pressure in your arteries as your heart beats. The second ("bottom") number is called the diastolic pressure. It is a measure of the pressure in your arteries as the heart relaxes. What are the causes? The cause of this condition is not known. What increases the risk? Some risk factors for high blood pressure are under your control. Others are not. Factors you can change  Smoking.  Having type 2 diabetes mellitus, high cholesterol, or both.  Not getting enough exercise or physical activity.  Being overweight.  Having too much fat, sugar, calories, or salt (sodium) in your diet.  Drinking too much alcohol. Factors that are difficult or impossible to change  Having chronic kidney disease.  Having a family history of high blood pressure.  Age. Risk increases with age.  Race. You may be at higher risk if you are African-American.  Gender. Men are at higher risk than women before age 73. After age 33, women are at higher risk than men.  Having obstructive sleep apnea.  Stress. What are the signs or symptoms? Extremely high blood pressure (hypertensive crisis) may cause:  Headache.  Anxiety.  Shortness of  breath.  Nosebleed.  Nausea and vomiting.  Severe chest pain.  Jerky movements you cannot control (seizures).  How is this diagnosed? This condition is diagnosed by measuring your blood pressure while you are seated, with your arm resting on a surface. The cuff of the blood pressure monitor will be placed directly against the skin of your upper arm at the level of your heart. It should be measured at least twice using the same arm. Certain conditions can cause a difference in blood pressure between your right and left arms. Certain factors can cause blood pressure readings to be lower or higher than normal (elevated) for a short period of time:  When your blood pressure is higher when you are in a health care provider's office than when you are at home, this is called white coat hypertension. Most people with this condition do not need medicines.  When your blood pressure is higher at home than when you are in a health care provider's office, this is called masked hypertension. Most people with this condition may need medicines to control blood pressure.  If you have a high blood pressure reading during one visit or you have normal blood pressure with other risk factors:  You may be asked to return on a different day to have your blood pressure checked again.  You may be asked to monitor your blood pressure at home for 1 week or longer.  If you are diagnosed with hypertension, you may have other blood or imaging tests to help your health care provider understand your overall risk for other conditions. How is this treated? This condition is treated by making healthy  lifestyle changes, such as eating healthy foods, exercising more, and reducing your alcohol intake. Your health care provider may prescribe medicine if lifestyle changes are not enough to get your blood pressure under control, and if:  Your systolic blood pressure is above 130.  Your diastolic blood pressure is above  80.  Your personal target blood pressure may vary depending on your medical conditions, your age, and other factors. Follow these instructions at home: Eating and drinking  Eat a diet that is high in fiber and potassium, and low in sodium, added sugar, and fat. An example eating plan is called the DASH (Dietary Approaches to Stop Hypertension) diet. To eat this way: ? Eat plenty of fresh fruits and vegetables. Try to fill half of your plate at each meal with fruits and vegetables. ? Eat whole grains, such as whole wheat pasta, brown rice, or whole grain bread. Fill about one quarter of your plate with whole grains. ? Eat or drink low-fat dairy products, such as skim milk or low-fat yogurt. ? Avoid fatty cuts of meat, processed or cured meats, and poultry with skin. Fill about one quarter of your plate with lean proteins, such as fish, chicken without skin, beans, eggs, and tofu. ? Avoid premade and processed foods. These tend to be higher in sodium, added sugar, and fat.  Reduce your daily sodium intake. Most people with hypertension should eat less than 1,500 mg of sodium a day.  Limit alcohol intake to no more than 1 drink a day for nonpregnant women and 2 drinks a day for men. One drink equals 12 oz of beer, 5 oz of wine, or 1 oz of hard liquor. Lifestyle  Work with your health care provider to maintain a healthy body weight or to lose weight. Ask what an ideal weight is for you.  Get at least 30 minutes of exercise that causes your heart to beat faster (aerobic exercise) most days of the week. Activities may include walking, swimming, or biking.  Include exercise to strengthen your muscles (resistance exercise), such as pilates or lifting weights, as part of your weekly exercise routine. Try to do these types of exercises for 30 minutes at least 3 days a week.  Do not use any products that contain nicotine or tobacco, such as cigarettes and e-cigarettes. If you need help quitting, ask  your health care provider.  Monitor your blood pressure at home as told by your health care provider.  Keep all follow-up visits as told by your health care provider. This is important. Medicines  Take over-the-counter and prescription medicines only as told by your health care provider. Follow directions carefully. Blood pressure medicines must be taken as prescribed.  Do not skip doses of blood pressure medicine. Doing this puts you at risk for problems and can make the medicine less effective.  Ask your health care provider about side effects or reactions to medicines that you should watch for. Contact a health care provider if:  You think you are having a reaction to a medicine you are taking.  You have headaches that keep coming back (recurring).  You feel dizzy.  You have swelling in your ankles.  You have trouble with your vision. Get help right away if:  You develop a severe headache or confusion.  You have unusual weakness or numbness.  You feel faint.  You have severe pain in your chest or abdomen.  You vomit repeatedly.  You have trouble breathing. Summary  Hypertension is when the  force of blood pumping through your arteries is too strong. If this condition is not controlled, it may put you at risk for serious complications.  Your personal target blood pressure may vary depending on your medical conditions, your age, and other factors. For most people, a normal blood pressure is less than 120/80.  Hypertension is treated with lifestyle changes, medicines, or a combination of both. Lifestyle changes include weight loss, eating a healthy, low-sodium diet, exercising more, and limiting alcohol. This information is not intended to replace advice given to you by your health care provider. Make sure you discuss any questions you have with your health care provider. Document Released: 10/28/2005 Document Revised: 09/25/2016 Document Reviewed: 09/25/2016 [employer]  [employer] Patient Education  [employer].

## 2017-05-01 NOTE — Progress Notes (Signed)
New patient office visit note:  Impression and Recommendations:    1. 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 Glucose intolerance (impaired glucose tolerance)   3. Mixed hyperlipidemia   4. Benign essential HTN   5. Depression, unspecified depression type      No problem-specific Assessment & Plan notes found for this encounter.   The patient was counseled, risk factors were discussed, anticipatory guidance given.   New Prescriptions   No medications on file     Discontinued Medications   No medications on file      Orders Placed This Encounter  Procedures  . Ambulatory referral to [facility]     Gross side effects, risk and benefits, and alternatives of medications discussed with patient.  Patient is aware that all medications have potential side effects and we are unable to predict every side effect or drug-drug interaction that may occur.  Expresses verbal understanding and consents to current therapy plan and treatment regimen.  Return in about 4 months (around 08/31/2017) for Hypertension follow up every 4 mo, labs- A1c, FLP etc.  Please see AVS handed out to patient at the end of our visit for further patient instructions/ counseling done pertaining to today's office visit.    Note: This document was prepared using Dragon voice recognition software and may include unintentional dictation errors.  ----------------------------------------------------------------------------------------------------------------------    Subjective:    Chief complaint:   Chief Complaint  Patient presents with  . Establish Care     HPI: [name] [name] is a pleasant 71 y.o. female who presents to [place] [facility] today to review their medical history with me and establish care.   I asked the patient to review their chronic problem list with me to ensure everything was  updated and accurate.    All recent office visits with other providers, any medical records that patient brought in etc  - I reviewed today.     Also asked pt to get me medical records from [facility] providers/ specialists that they had seen within the past 3-5 years- if they are in private practice and/or do not work for a [employer], [facility], [place], [place] or [employer] owned practice.  Told them to call their specialists to clarify this if they are not sure.   Pt had not had any healthcare in many yrs- no routine screenings etc.  Last PCP-  Many yrs ago- doesn't wknow name  Recently in May- had cCEA- L side and CVA--> affecting entire R side- weakenss in arm, leg and R peripheral vision.   Doing rehab now- 3d/wk.   SOme thought process   Used to work, prior to stroke FT,  Lives alone  HTN-->   At home Bp 150's, isolated high of 173/ 85 only one time though.  Patient has been experiencing some depression since her stroke.  They got a recommendation to go see Dr. Haynes Bast, [place] the psychologist.  They asked if I put in referral today which I well.  Problem  Glucose Intolerance (Impaired Glucose Tolerance)  Benign Essential Htn  Hyperlipidemia  Stroke (cerebrum) ([place])- Left greater than right watershed infarctions  Depression      Wt Readings from Last 3 Encounters:  05/01/17 143 lb 4.8 oz (65 kg)  04/30/17 148 lb (67.1 kg)  04/17/17 156 lb 14.4 oz (71.2 kg)   BP Readings from Last 3 Encounters:  05/01/17 Marland Kitchen)  156/83  04/30/17 (!) 154/79  04/20/17 (!) 174/66   Pulse Readings from Last 3 Encounters:  05/01/17 93  04/30/17 (!) 102  04/20/17 90   BMI Readings from Last 3 Encounters:  05/01/17 24.60 kg/m  04/30/17 23.53 kg/m  04/17/17 25.32 kg/m    Patient Care Team    Relationship Specialty Notifications Start End  Mellody Dance, DO PCP - General Family Medicine Admissions 04/17/17   Waynetta Sandy, MD Consulting Physician Vascular Surgery  05/01/17   Patsey Berthold, NP Nurse Practitioner Cardiology  05/01/17   [name], MD Consulting Physician Physical Medicine and Rehabilitation  05/01/17   [name], [name], NP Nurse Practitioner Vascular Surgery  05/01/17   Edgardo Roys, [place] Physician Psychology  05/01/17    Comment: Dr Serita Grit office ;  He is a neuropsych    Patient Active Problem List   Diagnosis Date Noted  . Glucose intolerance (impaired glucose tolerance) 05/01/2017    Priority: High  . Benign essential HTN     Priority: High  . Hyperlipidemia     Priority: High  . Stroke (cerebrum) ([place])- Left greater than right watershed infarctions 04/01/2017    Priority: High  . Depression 05/01/2017  . Hypertensive crisis   . Acute bilat watershed infarction [facility]) 04/09/2017  . CVA (cerebral vascular accident) ([place])   . Hemiparesis affecting dominant side as late effect of stroke ([place])   . Post-operative pain   . Status post carotid endarterectomy   . Stenosis of left carotid artery   . Gait disturbance, post-stroke 04/02/2017  . Right arm weakness   . Slowness and poor responsiveness   . Hypertensive emergency 04/01/2017  . Hypokalemia 04/01/2017  . Hyperglycemia 04/01/2017  . Stroke-like symptoms 04/01/2017     Past Medical History:  Diagnosis Date  . Hypertension   . Stroke [facility]      Past Medical History:  Diagnosis Date  . Hypertension   . Stroke [facility])      Past Surgical History:  Procedure Laterality Date  . ENDARTERECTOMY Left 04/08/2017   Procedure: ENDARTERECTOMY CAROTID;  Surgeon: Waynetta Sandy, MD;  Location: [place];  Service: Vascular;  Laterality: Left;  . PATCH ANGIOPLASTY Left 04/08/2017   Procedure: PATCH ANGIOPLASTY Left Carotid;  Surgeon: Waynetta Sandy, MD;  Location: [facility] OR;  Service: Vascular;  Laterality: Left;     Family History  Problem Relation Age of Onset  . Cancer Mother      History  Drug Use No     History  Alcohol Use No      History  Smoking Status  . Never Smoker  Smokeless Tobacco  . Never Used     Outpatient Encounter Prescriptions as of 05/01/2017  Medication Sig  . amLODipine (NORVASC) 10 MG tablet Take 1 tablet (10 mg total) by mouth daily.  Marland Kitchen aspirin 325 MG tablet Take 1 tablet (325 mg total) by mouth daily.  Marland Kitchen atorvastatin (LIPITOR) 80 MG tablet Take 1 tablet (80 mg total) by mouth daily at 6 PM.  . bisacodyl (DULCOLAX) 5 MG EC tablet Take 1 tablet (5 mg total) by mouth daily as needed for moderate constipation.  . clopidogrel (PLAVIX) 75 MG tablet Take 1 tablet (75 mg total) by mouth daily.  Marland Kitchen docusate sodium (COLACE) 100 MG capsule Take 1 capsule (100 mg total) by mouth daily.  Marland Kitchen lisinopril (PRINIVIL,ZESTRIL) 5 MG tablet Take 1 tablet (5 mg total) by mouth daily.  . Multiple Vitamins-Minerals (  MULTIVITAMIN WITH MINERALS) tablet Take 1 tablet by mouth daily.  . pantoprazole (PROTONIX) 40 MG tablet Take 1 tablet (40 mg total) by mouth daily.  [name] (EYE DROPS ALLERGY RELIEF OP) Place 2 drops into both eyes 2 (two) times daily.  . traZODone (DESYREL) 50 MG tablet Take 0.5-1 tablets (25-50 mg total) by mouth at bedtime as needed for sleep.   No facility-administered encounter medications on file as of 05/01/2017.     Allergies: Patient has no known allergies.   ROS   Objective:   Blood pressure (!) 156/83, pulse 93, height '5\' 4"'$  (1.626 m), weight 143 lb 4.8 oz (65 kg). Body mass index is 24.6 kg/m. General: Well Developed, well nourished, and in no acute distress.  Neuro: Alert and oriented x3, extra-ocular muscles intact, sensation grossly intact.  HEENT:[place]/AT, PERRLA, neck supple, No carotid bruits Skin: no gross rashes  Cardiac: Regular rate and rhythm Respiratory: Essentially clear to auscultation bilaterally. Not using accessory muscles, speaking in full sentences.  Abdominal: not grossly distended Musculoskeletal: Ambulates w/o diff, FROM * 4 ext.   Vasc: less 2 sec cap RF, warm and pink  Psych:  No HI/SI, judgement and insight good, Euthymic mood. Full Affect.    Recent Results (from the past 2160 hour(s))  Ethanol     Status: None   Collection Time: 04/01/17  9:20 AM  Result Value Ref Range   Alcohol, Ethyl (B) <5 <5 mg/dL    Comment:        LOWEST DETECTABLE LIMIT FOR SERUM ALCOHOL IS 5 mg/dL FOR MEDICAL PURPOSES ONLY   Protime-INR     Status: None   Collection Time: 04/01/17  9:20 AM  Result Value Ref Range   Prothrombin Time 14.1 11.4 - 15.2 seconds   INR 1.08   APTT     Status: None   Collection Time: 04/01/17  9:20 AM  Result Value Ref Range   aPTT 33 24 - 36 seconds  CBC     Status: Abnormal   Collection Time: 04/01/17  9:20 AM  Result Value Ref Range   WBC 5.6 4.0 - 10.5 K/uL   RBC 5.23 (H) 3.87 - 5.11 MIL/uL   Hemoglobin 13.5 12.0 - 15.0 g/dL   HCT 42.2 36.0 - 46.0 %   MCV 80.7 78.0 - 100.0 fL   MCH 25.8 (L) 26.0 - 34.0 pg   MCHC 32.0 30.0 - 36.0 g/dL   RDW 14.0 11.5 - 15.5 %   Platelets 161 150 - 400 K/uL  Differential     Status: None   Collection Time: 04/01/17  9:20 AM  Result Value Ref Range   Neutrophils Relative % 79 %   Neutro Abs 4.4 1.7 - 7.7 K/uL   Lymphocytes Relative 14 %   Lymphs Abs 0.8 0.7 - 4.0 K/uL   Monocytes Relative 5 %   Monocytes Absolute 0.3 0.1 - 1.0 K/uL   Eosinophils Relative 1 %   Eosinophils Absolute 0.1 0.0 - 0.7 K/uL   Basophils Relative 1 %   Basophils Absolute 0.0 0.0 - 0.1 K/uL  Comprehensive metabolic panel     Status: Abnormal   Collection Time: 04/01/17  9:20 AM  Result Value Ref Range   Sodium 140 135 - 145 mmol/L   Potassium 3.2 (L) 3.5 - 5.1 mmol/L   Chloride 106 101 - 111 mmol/L   CO2 24 22 - 32 mmol/L   Glucose, Bld 119 (H) 65 - 99 mg/dL   BUN 9  6 - 20 mg/dL   Creatinine, Ser 0.81 0.44 - 1.00 mg/dL   Calcium 9.1 8.9 - 10.3 mg/dL   Total Protein 6.7 6.5 - 8.1 g/dL   Albumin 3.7 3.5 - 5.0 g/dL   AST 17 15 - 41 U/L   ALT 12 (L) 14 - 54 U/L    Alkaline Phosphatase 68 38 - 126 U/L   Total Bilirubin 0.9 0.3 - 1.2 mg/dL   GFR calc non Af Amer >60 >60 mL/min   GFR calc Af Amer >60 >60 mL/min    Comment: (NOTE) The eGFR has been calculated using the CKD EPI equation. This calculation has not been validated in all clinical situations. eGFR's persistently <60 mL/min signify possible Chronic Kidney Disease.    Anion gap 10 5 - 15  Urine rapid drug screen (hosp performed)not at [facility]     Status: None   Collection Time: 04/01/17  9:20 AM  Result Value Ref Range   Opiates NONE DETECTED NONE DETECTED   Cocaine NONE DETECTED NONE DETECTED   Benzodiazepines NONE DETECTED NONE DETECTED   Amphetamines NONE DETECTED NONE DETECTED   Tetrahydrocannabinol NONE DETECTED NONE DETECTED   Barbiturates NONE DETECTED NONE DETECTED    Comment:        DRUG SCREEN FOR MEDICAL PURPOSES ONLY.  IF CONFIRMATION IS NEEDED FOR ANY PURPOSE, NOTIFY LAB WITHIN 5 DAYS.        LOWEST DETECTABLE LIMITS FOR URINE DRUG SCREEN Drug Class       Cutoff (ng/mL) Amphetamine      1000 Barbiturate      200 Benzodiazepine   622 Tricyclics       297 Opiates          300 Cocaine          300 THC              50   Urinalysis, Routine w reflex microscopic     Status: Abnormal   Collection Time: 04/01/17  9:20 AM  Result Value Ref Range   Color, Urine AMBER (A) YELLOW    Comment: BIOCHEMICALS MAY BE AFFECTED BY COLOR   APPearance CLOUDY (A) CLEAR   Specific Gravity, Urine 1.021 1.005 - 1.030   pH 5.0 5.0 - 8.0   Glucose, UA NEGATIVE NEGATIVE mg/dL   Hgb urine dipstick MODERATE (A) NEGATIVE   Bilirubin Urine NEGATIVE NEGATIVE   Ketones, ur 80 (A) NEGATIVE mg/dL   Protein, ur NEGATIVE NEGATIVE mg/dL   Nitrite NEGATIVE NEGATIVE   Leukocytes, UA LARGE (A) NEGATIVE   RBC / HPF 0-5 0 - 5 RBC/hpf   WBC, UA TOO NUMEROUS TO COUNT 0 - 5 WBC/hpf   Bacteria, UA MANY (A) NONE SEEN   Squamous Epithelial / LPF 0-5 (A) NONE SEEN   Mucous PRESENT    Non Squamous  Epithelial 0-5 (A) NONE SEEN  I-stat troponin, ED (not at [facility], [facility])     Status: None   Collection Time: 04/01/17  9:30 AM  Result Value Ref Range   Troponin i, poc 0.00 0.00 - 0.08 ng/mL   Comment 3            Comment: Due to the release kinetics of cTnI, a negative result within the first hours of the onset of symptoms does not rule out myocardial infarction with certainty. If myocardial infarction is still suspected, repeat the test at appropriate intervals.   I-Stat Chem 8, ED  (not at [facility], [facility])     Status: Abnormal  Collection Time: 04/01/17  9:32 AM  Result Value Ref Range   Sodium 141 135 - 145 mmol/L   Potassium 3.2 (L) 3.5 - 5.1 mmol/L   Chloride 104 101 - 111 mmol/L   BUN 9 6 - 20 mg/dL   Creatinine, Ser 4.44 0.44 - 1.00 mg/dL   Glucose, Bld 881 (H) 65 - 99 mg/dL   Calcium, Ion 9.12 (L) 1.15 - 1.40 mmol/L   TCO2 25 0 - 100 mmol/L   Hemoglobin 13.9 12.0 - 15.0 g/dL   HCT 88.8 00.1 - 09.8 %  Urine culture     Status: Abnormal   Collection Time: 04/01/17 12:16 PM  Result Value Ref Range   Specimen Description URINE, CATHETERIZED    Special Requests Normal    Culture MULTIPLE SPECIES PRESENT, SUGGEST RECOLLECTION (A)    Report Status 04/02/2017 FINAL   Troponin I     Status: None   Collection Time: 04/01/17  7:26 PM  Result Value Ref Range   Troponin I <0.03 <0.03 ng/mL  Hemoglobin A1c     Status: Abnormal   Collection Time: 04/02/17  8:14 AM  Result Value Ref Range   Hgb A1c MFr Bld 5.7 (H) 4.8 - 5.6 %    Comment: (NOTE)         Pre-diabetes: 5.7 - 6.4         Diabetes: >6.4         Glycemic control for adults with diabetes: <7.0    Mean Plasma Glucose 117 mg/dL    Comment: (NOTE) Performed At: [facility] [address] [place], [place] [id number] Mila Homer MD RT:[id number]   Lipid panel     Status: Abnormal   Collection Time: 04/02/17  8:14 AM  Result Value Ref Range   Cholesterol 211 (H) 0 - 200 mg/dL   Triglycerides 59 <063 mg/dL    HDL 69 >07 mg/dL   Total CHOL/HDL Ratio 3.1 RATIO   VLDL 12 0 - 40 mg/dL   LDL Cholesterol 316 (H) 0 - 99 mg/dL    Comment:        Total Cholesterol/HDL:CHD Risk Coronary Heart Disease Risk Table                     Men   Women  1/2 Average Risk   3.4   3.3  Average Risk       5.0   4.4  2 X Average Risk   9.6   7.1  3 X Average Risk  23.4   11.0        Use the calculated Patient Ratio above and the CHD Risk Table to determine the patient's CHD Risk.        ATP III CLASSIFICATION (LDL):  <100     mg/dL   Optimal  446-245  mg/dL   Near or Above                    Optimal  130-159  mg/dL   Borderline  006-956  mg/dL   High  >652     mg/dL   Very High   CBC     Status: Abnormal   Collection Time: 04/02/17  8:14 AM  Result Value Ref Range   WBC 3.6 (L) 4.0 - 10.5 K/uL   RBC 5.65 (H) 3.87 - 5.11 MIL/uL   Hemoglobin 14.8 12.0 - 15.0 g/dL   HCT 11.9 26.3 - 60.9 %   MCV 81.2 78.0 -  100.0 fL   MCH 26.2 26.0 - 34.0 pg   MCHC 32.2 30.0 - 36.0 g/dL   RDW 14.2 11.5 - 15.5 %   Platelets 172 150 - 400 K/uL  Basic metabolic panel     Status: None   Collection Time: 04/02/17  8:14 AM  Result Value Ref Range   Sodium 140 135 - 145 mmol/L   Potassium 3.9 3.5 - 5.1 mmol/L   Chloride 107 101 - 111 mmol/L   CO2 26 22 - 32 mmol/L   Glucose, Bld 93 65 - 99 mg/dL   BUN 9 6 - 20 mg/dL   Creatinine, Ser 0.83 0.44 - 1.00 mg/dL   Calcium 9.0 8.9 - 10.3 mg/dL   GFR calc non Af Amer >60 >60 mL/min   GFR calc Af Amer >60 >60 mL/min    Comment: (NOTE) The eGFR has been calculated using the CKD EPI equation. This calculation has not been validated in all clinical situations. eGFR's persistently <60 mL/min signify possible Chronic Kidney Disease.    Anion gap 7 5 - 15  VAS US CAROTID (at [facility] and [facility] only)     Status: None   Collection Time: 04/03/17  9:05 AM  Result Value Ref Range   Right CCA prox sys -49 cm/s   Right CCA prox dias -9 cm/s   Right cca dist sys -63 cm/s   Left CCA prox sys  50 cm/s   Left CCA prox dias 18 cm/s   Left CCA dist sys 51 cm/s   Left CCA dist dias 16 cm/s   Left ICA prox sys 107 cm/s   Left ICA prox dias 26 cm/s   Left ICA dist sys 83 cm/s   Left ICA dist dias 20 cm/s   RIGHT ECA DIAS -9.00 cm/s   RIGHT VERTEBRAL DIAS 31.00 cm/s   LEFT ECA DIAS -3.00 cm/s   LEFT VERTEBRAL DIAS -19.00 cm/s  ECHOCARDIOGRAM COMPLETE     Status: None   Collection Time: 04/03/17  3:20 PM  Result Value Ref Range   Weight 2,240 oz   Height 68 in   BP 183/64 mmHg  Protime-INR     Status: None   Collection Time: 04/08/17  4:59 AM  Result Value Ref Range   Prothrombin Time 13.2 11.4 - 15.2 seconds   INR 6.44   Basic metabolic panel     Status: Abnormal   Collection Time: 04/08/17  4:59 AM  Result Value Ref Range   Sodium 139 135 - 145 mmol/L   Potassium 3.8 3.5 - 5.1 mmol/L   Chloride 105 101 - 111 mmol/L   CO2 26 22 - 32 mmol/L   Glucose, Bld 102 (H) 65 - 99 mg/dL   BUN 16 6 - 20 mg/dL   Creatinine, Ser 0.75 0.44 - 1.00 mg/dL   Calcium 8.9 8.9 - 10.3 mg/dL   GFR calc non Af Amer >60 >60 mL/min   GFR calc Af Amer >60 >60 mL/min    Comment: (NOTE) The eGFR has been calculated using the CKD EPI equation. This calculation has not been validated in all clinical situations. eGFR's persistently <60 mL/min signify possible Chronic Kidney Disease.    Anion gap 8 5 - 15  CBC     Status: Abnormal   Collection Time: 04/08/17  4:59 AM  Result Value Ref Range   WBC 4.1 4.0 - 10.5 K/uL   RBC 4.99 3.87 - 5.11 MIL/uL   Hemoglobin 12.7 12.0 - 15.0 g/dL  HCT 40.5 36.0 - 46.0 %   MCV 81.2 78.0 - 100.0 fL   MCH 25.5 (L) 26.0 - 34.0 pg   MCHC 31.4 30.0 - 36.0 g/dL   RDW 14.1 11.5 - 15.5 %   Platelets 176 150 - 400 K/uL  POCT Activated clotting time     Status: None   Collection Time: 04/08/17  8:24 AM  Result Value Ref Range   Activated Clotting Time 252 seconds  Glucose, capillary     Status: Abnormal   Collection Time: 04/08/17 10:08 AM  Result Value Ref  Range   Glucose-Capillary 102 (H) 65 - 99 mg/dL   Comment 1 Notify RN    Comment 2 Document in Chart   MRSA PCR Screening     Status: None   Collection Time: 04/08/17  3:14 PM  Result Value Ref Range   MRSA by PCR NEGATIVE NEGATIVE    Comment:        The GeneXpert MRSA Assay (FDA approved for NASAL specimens only), is one component of a comprehensive MRSA colonization surveillance program. It is not intended to diagnose MRSA infection nor to guide or monitor treatment for MRSA infections.   CBC     Status: Abnormal   Collection Time: 04/08/17  3:41 PM  Result Value Ref Range   WBC 7.7 4.0 - 10.5 K/uL   RBC 4.93 3.87 - 5.11 MIL/uL   Hemoglobin 12.2 12.0 - 15.0 g/dL   HCT 40.5 36.0 - 46.0 %   MCV 82.2 78.0 - 100.0 fL   MCH 24.7 (L) 26.0 - 34.0 pg   MCHC 30.1 30.0 - 36.0 g/dL   RDW 14.0 11.5 - 15.5 %   Platelets 161 150 - 400 K/uL  Creatinine, serum     Status: None   Collection Time: 04/08/17  3:41 PM  Result Value Ref Range   Creatinine, Ser 0.78 0.44 - 1.00 mg/dL   GFR calc non Af Amer >60 >60 mL/min   GFR calc Af Amer >60 >60 mL/min    Comment: (NOTE) The eGFR has been calculated using the CKD EPI equation. This calculation has not been validated in all clinical situations. eGFR's persistently <60 mL/min signify possible Chronic Kidney Disease.   CBC     Status: Abnormal   Collection Time: 04/09/17  5:10 AM  Result Value Ref Range   WBC 4.7 4.0 - 10.5 K/uL   RBC 4.57 3.87 - 5.11 MIL/uL   Hemoglobin 11.6 (L) 12.0 - 15.0 g/dL   HCT 37.7 36.0 - 46.0 %   MCV 82.5 78.0 - 100.0 fL   MCH 25.4 (L) 26.0 - 34.0 pg   MCHC 30.8 30.0 - 36.0 g/dL   RDW 14.5 11.5 - 15.5 %   Platelets 167 150 - 400 K/uL  Basic metabolic panel     Status: Abnormal   Collection Time: 04/09/17  5:10 AM  Result Value Ref Range   Sodium 140 135 - 145 mmol/L   Potassium 3.9 3.5 - 5.1 mmol/L   Chloride 107 101 - 111 mmol/L   CO2 26 22 - 32 mmol/L   Glucose, Bld 94 65 - 99 mg/dL   BUN 9 6 - 20  mg/dL   Creatinine, Ser 0.79 0.44 - 1.00 mg/dL   Calcium 8.7 (L) 8.9 - 10.3 mg/dL   GFR calc non Af Amer >60 >60 mL/min   GFR calc Af Amer >60 >60 mL/min    Comment: (NOTE) The eGFR has been calculated using the CKD  EPI equation. This calculation has not been validated in all clinical situations. eGFR's persistently <60 mL/min signify possible Chronic Kidney Disease.    Anion gap 7 5 - 15  CBC WITH DIFFERENTIAL     Status: Abnormal   Collection Time: 04/10/17  6:03 AM  Result Value Ref Range   WBC 5.0 4.0 - 10.5 K/uL   RBC 4.71 3.87 - 5.11 MIL/uL   Hemoglobin 11.9 (L) 12.0 - 15.0 g/dL   HCT 38.8 36.0 - 46.0 %   MCV 82.4 78.0 - 100.0 fL   MCH 25.3 (L) 26.0 - 34.0 pg   MCHC 30.7 30.0 - 36.0 g/dL   RDW 13.9 11.5 - 15.5 %   Platelets 154 150 - 400 K/uL   Neutrophils Relative % 57 %   Neutro Abs 2.8 1.7 - 7.7 K/uL   Lymphocytes Relative 27 %   Lymphs Abs 1.3 0.7 - 4.0 K/uL   Monocytes Relative 12 %   Monocytes Absolute 0.6 0.1 - 1.0 K/uL   Eosinophils Relative 3 %   Eosinophils Absolute 0.2 0.0 - 0.7 K/uL   Basophils Relative 1 %   Basophils Absolute 0.0 0.0 - 0.1 K/uL  Comprehensive metabolic panel     Status: Abnormal   Collection Time: 04/10/17  6:03 AM  Result Value Ref Range   Sodium 141 135 - 145 mmol/L   Potassium 3.7 3.5 - 5.1 mmol/L   Chloride 107 101 - 111 mmol/L   CO2 25 22 - 32 mmol/L   Glucose, Bld 100 (H) 65 - 99 mg/dL   BUN 9 6 - 20 mg/dL   Creatinine, Ser 0.70 0.44 - 1.00 mg/dL   Calcium 8.8 (L) 8.9 - 10.3 mg/dL   Total Protein 6.1 (L) 6.5 - 8.1 g/dL   Albumin 3.0 (L) 3.5 - 5.0 g/dL   AST 25 15 - 41 U/L   ALT 36 14 - 54 U/L   Alkaline Phosphatase 67 38 - 126 U/L   Total Bilirubin 0.7 0.3 - 1.2 mg/dL   GFR calc non Af Amer >60 >60 mL/min   GFR calc Af Amer >60 >60 mL/min    Comment: (NOTE) The eGFR has been calculated using the CKD EPI equation. This calculation has not been validated in all clinical situations. eGFR's persistently <60 mL/min signify  possible Chronic Kidney Disease.    Anion gap 9 5 - 15

## 2017-05-02 ENCOUNTER — Telehealth: Payer: Self-pay | Admitting: Family Medicine

## 2017-05-02 ENCOUNTER — Encounter: Payer: Self-pay | Admitting: Nurse Practitioner

## 2017-05-02 NOTE — Telephone Encounter (Signed)
[place] provider/Cindy Myra request verbal auth for Speech therapy now that pt has been seen by provider--please call [phone]. --glh

## 2017-05-02 NOTE — Telephone Encounter (Signed)
Left message with Juliann Pulse at [facility] that she would need to contact Dr Naaman Plummer who ordered the therapy to give verbal.

## 2017-05-05 ENCOUNTER — Telehealth: Payer: Self-pay | Admitting: *Deleted

## 2017-05-05 NOTE — Telephone Encounter (Signed)
Scarlett from [facility] called and is asking if Dr Naaman Plummer will be willing to sign the [facility] orders.  Paquita went to her new PCP Mellody Dance, and she is not going to sign them.  I spoke with Scarlett and assured her we placed the order at discharge and Dr Naaman Plummer will be signing the orders.

## 2017-05-09 ENCOUNTER — Telehealth: Payer: Self-pay

## 2017-05-09 NOTE — Telephone Encounter (Signed)
[name] [name]  SP [facility] called [phone] stated that while on her visit with this patient that her blood pressure was elevated at rest at 180/88 and asymptomatic, I advised her that as long as the patient is asymptomatic that it is ok and to insure the patient follows up with either her primary care provider for management of her BP and if she does become symptomatic then she needs to go together ER or Urgent care for treatment

## 2017-05-12 ENCOUNTER — Telehealth: Payer: Self-pay | Admitting: Family Medicine

## 2017-05-12 NOTE — Telephone Encounter (Signed)
Juliann Pulse from [facility] called and left VM late Friday stating patient had a BP reading of 180/88 and wanted to let the provider know. She stated if provider had any recommendations that she can be reached at [phone]

## 2017-05-12 NOTE — Telephone Encounter (Signed)
Called left message informing [facility] that Dr Raliegh Scarlet is out of the office until Thursday.  Advised to continue monitoring BP and if needed to call office back.  Also advised that it may be best to contact the provider that initiated home health for the patient.   MPulliam, CMA/RT(R)

## 2017-05-15 NOTE — Telephone Encounter (Signed)
That sounds appropriate. Thank you

## 2017-05-17 ENCOUNTER — Other Ambulatory Visit: Payer: Self-pay | Admitting: Physical Medicine & Rehabilitation

## 2017-05-19 NOTE — Telephone Encounter (Signed)
This is not a medication that Physical Medicine and Rehabilitation would continue to prescribe following hospital discharge. PCP is listed as Mellody Dance, DO

## 2017-05-20 NOTE — Progress Notes (Deleted)
Electrophysiology Office Note Date: 05/20/2017  ID:  [name] [name], DOB 04-28-1946, MRN [id number]  PCP: Mellody Dance, DO Primary Cardiologist: *** Electrophysiologist: ***  CC: ***  [name] [name] is a 71 y.o. female seen today for ***.  {He/she (caps):30048} presents today for routine electrophysiology followup.  Since last being seen in our clinic, the patient reports doing very well.  {He/she (caps):30048} denies chest pain, palpitations, dyspnea, PND, orthopnea, nausea, vomiting, dizziness, syncope, edema, weight gain, or early satiety.  Past Medical History:  Diagnosis Date  . Hypertension   . Stroke [facility])    Past Surgical History:  Procedure Laterality Date  . ENDARTERECTOMY Left 04/08/2017   Procedure: ENDARTERECTOMY CAROTID;  Surgeon: Waynetta Sandy, MD;  Location: [place];  Service: Vascular;  Laterality: Left;  . PATCH ANGIOPLASTY Left 04/08/2017   Procedure: PATCH ANGIOPLASTY Left Carotid;  Surgeon: Waynetta Sandy, MD;  Location: [place];  Service: Vascular;  Laterality: Left;    Current Outpatient Prescriptions  Medication Sig Dispense Refill  . amLODipine (NORVASC) 10 MG tablet Take 1 tablet (10 mg total) by mouth daily. 90 tablet 0  . aspirin 325 MG tablet Take 1 tablet (325 mg total) by mouth daily. 30 tablet 0  . atorvastatin (LIPITOR) 80 MG tablet Take 1 tablet (80 mg total) by mouth daily at 6 PM. 30 tablet 0  . bisacodyl (DULCOLAX) 5 MG EC tablet Take 1 tablet (5 mg total) by mouth daily as needed for moderate constipation. 30 tablet 0  . clopidogrel (PLAVIX) 75 MG tablet Take 1 tablet (75 mg total) by mouth daily. 30 tablet 0  . docusate sodium (COLACE) 100 MG capsule Take 1 capsule (100 mg total) by mouth daily. 30 capsule 0  . lisinopril (PRINIVIL,ZESTRIL) 5 MG tablet Take 1 tablet (5 mg total) by mouth daily. 30 tablet 1  . Multiple Vitamins-Minerals (MULTIVITAMIN WITH MINERALS) tablet Take 1 tablet by mouth daily.    . pantoprazole  (PROTONIX) 40 MG tablet Take 1 tablet (40 mg total) by mouth daily. 30 tablet 0  . Tetrahydrozoline-Zn Sulfate (EYE DROPS ALLERGY RELIEF OP) Place 2 drops into both eyes 2 (two) times daily.    . traZODone (DESYREL) 50 MG tablet Take 0.5-1 tablets (25-50 mg total) by mouth at bedtime as needed for sleep. 15 tablet 0   No current facility-administered medications for this visit.     Allergies:   Patient has no known allergies.   Social History: Social History   Social History  . Marital status: Single    Spouse name: N/A  . Number of children: N/A  . Years of education: N/A   Occupational History  . Not on file.   Social History Main Topics  . Smoking status: Never Smoker  . Smokeless tobacco: Never Used  . Alcohol use No  . Drug use: No  . Sexual activity: No   Other Topics Concern  . Not on file   Social History Narrative  . No narrative on file    Family History: Family History  Problem Relation Age of Onset  . Cancer Mother     Review of Systems: General: No chills, fever, night sweats or weight changes  Cardiovascular:  No chest pain, dyspnea on exertion, edema, orthopnea, palpitations, paroxysmal nocturnal dyspnea  Dermatological: No rash, lesions or masses Respiratory: No cough, dyspnea Urologic: No hematuria, dysuria Abdominal: No nausea, vomiting, diarrhea, bright red blood per rectum, melena, or hematemesis Neurologic: No visual changes, weakness, changes in mental  status All other systems reviewed and are otherwise negative except as noted above.   Physical Exam: VS:  There were no vitals taken for this visit. , BMI There is no height or weight on file to calculate BMI. Wt Readings from Last 3 Encounters:  05/01/17 143 lb 4.8 oz (65 kg)  04/30/17 148 lb (67.1 kg)  04/17/17 156 lb 14.4 oz (71.2 kg)    GEN- The patient is well appearing, alert and oriented x 3 today.   HEENT: normocephalic, atraumatic; sclera clear, conjunctiva pink; hearing  intact; oropharynx clear; neck supple, no JVP Lymph- no cervical lymphadenopathy Lungs- Clear to ausculation bilaterally, normal work of breathing.  No wheezes, rales, rhonchi Heart- Regular rate and rhythm, no murmurs, rubs or gallops, PMI not laterally displaced GI- soft, non-tender, non-distended, bowel sounds present, no hepatosplenomegaly Extremities- no clubbing, cyanosis, or edema; DP/PT/radial pulses 2+ bilaterally MS- no significant deformity or atrophy Skin- warm and dry, no rash or lesion  Psych- euthymic mood, full affect Neuro- strength and sensation are intact   EKG:  EKG {ACTION; IS/IS TWS:56812751} ordered today. The ekg ordered today shows ***  Recent Labs: 04/10/2017: ALT 36; BUN 9; Creatinine, Ser 0.70; Hemoglobin 11.9; Platelets 154; Potassium 3.7; Sodium 141    Other studies Reviewed: Additional studies/ records that were reviewed today include: ***  Review of the above records today demonstrates: ***  Assessment and Plan:  ***   Current medicines are reviewed at length with the patient today.   The patient {ACTIONS; HAS/DOES NOT HAVE:19233} concerns regarding her medicines.  The following changes were made today:  {NONE DEFAULTED:18576::"none"}  Labs/ tests ordered today include: *** No orders of the defined types were placed in this encounter.    Disposition:   Follow up with *** {gen number [id number]} {TIME; UNITS DAY/WEEK/MONTH:19136}   Signed, Chanetta Marshall, NP 05/20/2017 9:53 AM   [facility] [address] [place] [address] [phone] (office) [phone] (fax

## 2017-05-21 ENCOUNTER — Ambulatory Visit: Payer: BLUE CROSS/BLUE SHIELD | Admitting: Nurse Practitioner

## 2017-05-21 ENCOUNTER — Telehealth: Payer: Self-pay | Admitting: *Deleted

## 2017-05-21 NOTE — Telephone Encounter (Signed)
[name] daughter of Mrs Howden called about getting refills on medications.  Her hosp follow up is not until 06/02/17 with Dr Naaman Plummer. She has estabilished care with a Dr Salli Real I on 04/29/17. I asked that the daughter call me back to discuss what is needed. The future refill of meds should be assumed by the primary .  Dr Naaman Plummer do you want to ok refills attached?

## 2017-05-22 MED ORDER — CLOPIDOGREL BISULFATE 75 MG PO TABS: 75.0000 mg | ORAL_TABLET | Freq: Every day | ORAL | 0 refills | Status: DC

## 2017-05-22 MED ORDER — LISINOPRIL 5 MG PO TABS: 5.0000 mg | ORAL_TABLET | Freq: Every day | ORAL | 1 refills | Status: DC

## 2017-05-22 MED ORDER — PANTOPRAZOLE SODIUM 40 MG PO TBEC: 40.0000 mg | DELAYED_RELEASE_TABLET | Freq: Every day | ORAL | 0 refills | Status: DC

## 2017-05-22 MED ORDER — TRAZODONE HCL 50 MG PO TABS: 25.0000 mg | ORAL_TABLET | Freq: Every evening | ORAL | 0 refills | Status: DC | PRN

## 2017-05-22 MED ORDER — ATORVASTATIN CALCIUM 80 MG PO TABS: 80.0000 mg | ORAL_TABLET | Freq: Every day | ORAL | 0 refills | Status: DC

## 2017-05-22 NOTE — Telephone Encounter (Signed)
Notified her daughter [name].

## 2017-05-22 NOTE — Telephone Encounter (Signed)
I refilled all meds

## 2017-06-02 ENCOUNTER — Encounter: Payer: Self-pay | Admitting: Physical Medicine & Rehabilitation

## 2017-06-02 ENCOUNTER — Encounter
Payer: BLUE CROSS/BLUE SHIELD | Attending: Physical Medicine & Rehabilitation | Admitting: Physical Medicine & Rehabilitation

## 2017-06-02 VITALS — BP 168/80 | HR 101

## 2017-06-02 DIAGNOSIS — I10 Essential (primary) hypertension: Secondary | ICD-10-CM | POA: Insufficient documentation

## 2017-06-02 DIAGNOSIS — I638 Other cerebral infarction: Secondary | ICD-10-CM | POA: Insufficient documentation

## 2017-06-02 DIAGNOSIS — E785 Hyperlipidemia, unspecified: Secondary | ICD-10-CM | POA: Diagnosis not present

## 2017-06-02 DIAGNOSIS — R269 Unspecified abnormalities of gait and mobility: Secondary | ICD-10-CM

## 2017-06-02 DIAGNOSIS — F419 Anxiety disorder, unspecified: Secondary | ICD-10-CM | POA: Diagnosis not present

## 2017-06-02 DIAGNOSIS — G8191 Hemiplegia, unspecified affecting right dominant side: Secondary | ICD-10-CM | POA: Diagnosis not present

## 2017-06-02 DIAGNOSIS — I69359 Hemiplegia and hemiparesis following cerebral infarction affecting unspecified side: Secondary | ICD-10-CM

## 2017-06-02 DIAGNOSIS — I639 Cerebral infarction, unspecified: Secondary | ICD-10-CM | POA: Diagnosis not present

## 2017-06-02 DIAGNOSIS — Z8673 Personal history of transient ischemic attack (TIA), and cerebral infarction without residual deficits: Secondary | ICD-10-CM | POA: Diagnosis not present

## 2017-06-02 DIAGNOSIS — I69398 Other sequelae of cerebral infarction: Secondary | ICD-10-CM

## 2017-06-02 MED ORDER — LISINOPRIL 10 MG PO TABS: 10.0000 mg | ORAL_TABLET | Freq: Every day | ORAL | 4 refills | Status: DC

## 2017-06-02 NOTE — Patient Instructions (Addendum)
PLEASE FEEL FREE TO CALL OUR OFFICE WITH ANY PROBLEMS OR QUESTIONS ([phone])   CONTACT ME IF YOUR BLOOD PRESSURE REMAINS ELEVATED DESPITE THE INCREASE WE MADE TODAY    LET ME KNOW ABOUT THERAPIES.

## 2017-06-02 NOTE — Progress Notes (Signed)
Subjective:    Patient ID: [name] [name], female    DOB: Mar 31, 1946, 71 y.o.   MRN: [id number]  HPI   [name] [name] is here in follow up of her bilateral watershed infarcts and subsequent deficits. She has been doing well thus far. She has noticed that her BP has fluctuated up and down, especially it's been high with anxiety and sometimes of physical activity. She has been told she has "white coat syndrome". It generally has been running high even at baseline in the 140's +.  She has followed up with her primary re: bp and the primary apparently wasn't "comfortable" with making adjustments. She is on norvasc and lisinopril currently.   Her balance has been good although she still struggles a bit on stairs on higher level balance. She has been sleeping well. Her bowels and bladder are working without issues.   Pain Inventory Average Pain 0 Pain Right Now 0 My pain is no pain  In the last 24 hours, has pain interfered with the following? General activity 0 Relation with others 0 Enjoyment of life 0 What TIME of day is your pain at its worst? n/a Sleep (in general) Fair  Pain is worse with: no pain Pain improves with: no pain Relief from Meds: no pain  Mobility walk without assistance walk with assistance how many minutes can you walk? 10-15 ability to climb steps?  no do you drive?  no  Function employed # of hrs/week 30-40 not employed: date last employed 03/2017 I need assistance with the following:  bathing, meal prep, household duties and shopping  Neuro/Psych weakness trouble walking anxiety  Prior Studies Any changes since last visit?  no  Physicians involved in your care Any changes since last visit?  no   Family History  Problem Relation Age of Onset  . Cancer Mother    Social History   Social History  . Marital status: Single    Spouse name: N/A  . Number of children: N/A  . Years of education: N/A   Social History Main Topics  . Smoking status:  Never Smoker  . Smokeless tobacco: Never Used  . Alcohol use No  . Drug use: No  . Sexual activity: No   Other Topics Concern  . None   Social History Narrative  . None   Past Surgical History:  Procedure Laterality Date  . ENDARTERECTOMY Left 04/08/2017   Procedure: ENDARTERECTOMY CAROTID;  Surgeon: Waynetta Sandy, MD;  Location: [place];  Service: Vascular;  Laterality: Left;  . PATCH ANGIOPLASTY Left 04/08/2017   Procedure: PATCH ANGIOPLASTY Left Carotid;  Surgeon: Waynetta Sandy, MD;  Location: [place];  Service: Vascular;  Laterality: Left;   Past Medical History:  Diagnosis Date  . Hypertension   . Stroke ([place])    BP (!) 168/80   Pulse (!) 101   SpO2 96%   Opioid Risk Score:   Fall Risk Score:  `1  Depression screen PHQ 2/9  Depression screen [facility] 2/9 06/02/2017 05/01/2017  Decreased Interest 0 0  Down, Depressed, Hopeless 0 1  PHQ - 2 Score 0 1  Altered sleeping 1 -  Tired, decreased energy 0 -  Change in appetite 0 -  Feeling bad or failure about yourself  0 -  Trouble concentrating 0 -  Moving slowly or fidgety/restless 0 -  Suicidal thoughts 0 -  PHQ-9 Score 1 -    Review of Systems  Constitutional: Negative.   HENT: Negative.   Eyes:  Negative.   Respiratory: Negative.   Cardiovascular: Negative.   Gastrointestinal: Negative.   Endocrine: Negative.   Genitourinary: Negative.   Musculoskeletal: Negative.   Skin: Negative.   Allergic/Immunologic: Negative.   Neurological: Negative.   Hematological: Negative.   Psychiatric/Behavioral: Negative.   All other systems reviewed and are negative.      Objective:   Physical Exam  Constitutional: She appears well-developed and well-nourished. NAD.  HENT: Normocephalic. Neck CDI Eyes: EOMI. No discharge.  Cardiovascular: RRR without murmur. No JVD  Respiratory: CTA Bilaterally without wheezes or rales. Normal effort  GI: Soft. Bowel sounds are normal.  Musculoskeletal: She exhibits  moinimal edema .  Neurological: She is alert and oriented Motor:  Motor: Right shoulder abduction 4-/5, elbow flex/ext   3+ to 4-/5, HI 3/5.  RLE: 4/5 proximal to distal LUE/LLE: 5/5 proximal to distal Mild right inattention. Shuffles right leg sl during gait. Recalled 0/3 words after 5 minutes. Had difficulties with sequencing activities. Able to sequence numbers with extra time. Spelled world forward but could not backwards. Oriented to current affairs. Abstract thinking intact Skin: Neck incision c/d/i Psychiatric: She has a normal mood and affect. Her behavior is normal      Assessment & Plan:  Medical Problem List and Plan: 1. 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             -making functional gains  -would be best suited for outpt therapies, PT, OT, SLP 2.  Hypertension. Norvasc increased to 10mg , Lisinopril 5mg  INCREASED TO 10mg  today             -titrate further as needed 3. Hyperlipidemia. Lipitor   15 minutes in counseling and coordination of care, discussion of therapy/bp/etc. Follow up in 2 month.s

## 2017-06-05 ENCOUNTER — Telehealth: Payer: Self-pay | Admitting: *Deleted

## 2017-06-05 DIAGNOSIS — I6389 Other cerebral infarction: Secondary | ICD-10-CM

## 2017-06-05 DIAGNOSIS — I69359 Hemiplegia and hemiparesis following cerebral infarction affecting unspecified side: Secondary | ICD-10-CM

## 2017-06-05 NOTE — Telephone Encounter (Signed)
[name] Jaquess's daughter called to report that [name] [name] does want to pursue outpt therapy.

## 2017-06-09 ENCOUNTER — Telehealth: Payer: Self-pay | Admitting: Physical Medicine & Rehabilitation

## 2017-06-09 NOTE — Telephone Encounter (Signed)
Put inbasket message to [facility] - patient wants to start OP Therapy when her Home Health ends this week. She left message last week - no call back as of yet.

## 2017-06-09 NOTE — Telephone Encounter (Signed)
I sent message Friday as well.

## 2017-06-11 NOTE — Telephone Encounter (Signed)
Referrals to [facility] made for PT, OT, SLP

## 2017-06-13 NOTE — Telephone Encounter (Signed)
[name] called and reported they have not heard anything about therapy.  I have let her know that the referral is in and they should hear something next week.

## 2017-06-19 ENCOUNTER — Other Ambulatory Visit: Payer: Self-pay | Admitting: Physical Medicine & Rehabilitation

## 2017-06-23 ENCOUNTER — Telehealth: Payer: Self-pay

## 2017-06-23 NOTE — Telephone Encounter (Signed)
Patients daughter called, requesting information on changed medications and status on her referrals, called her back and gave her the information on file

## 2017-07-01 ENCOUNTER — Encounter: Payer: Self-pay | Admitting: Neurology

## 2017-07-01 ENCOUNTER — Ambulatory Visit (INDEPENDENT_AMBULATORY_CARE_PROVIDER_SITE_OTHER): Payer: BLUE CROSS/BLUE SHIELD | Admitting: Neurology

## 2017-07-01 VITALS — BP 180/91 | HR 81 | Ht 64.0 in | Wt 153.2 lb

## 2017-07-01 DIAGNOSIS — I679 Cerebrovascular disease, unspecified: Secondary | ICD-10-CM

## 2017-07-01 DIAGNOSIS — Z9889 Other specified postprocedural states: Secondary | ICD-10-CM | POA: Diagnosis not present

## 2017-07-01 DIAGNOSIS — E785 Hyperlipidemia, unspecified: Secondary | ICD-10-CM | POA: Diagnosis not present

## 2017-07-01 DIAGNOSIS — I63132 Cerebral infarction due to embolism of left carotid artery: Secondary | ICD-10-CM

## 2017-07-01 DIAGNOSIS — I10 Essential (primary) hypertension: Secondary | ICD-10-CM

## 2017-07-01 NOTE — Progress Notes (Signed)
STROKE NEUROLOGY FOLLOW UP NOTE  NAME: [name] [name] DOB: 01/05/1946  REASON FOR VISIT: stroke follow up HISTORY FROM: pt and daughter and chart  Today we had the pleasure of seeing [name] in follow-up at our Neurology Clinic. Pt was accompanied by daughter.   History Summary [name] is a 71 y.o. female with history of previous strokes admitted on 04/01/17 for right UE weakness, hypertensive urgency, UTI and AMS. MRI showed left MCA and b/l ACA patchy acute infarcts. MRA and CTA head showed severe right tandem and moderate left ICA siphon stenosis, severe bilateral VA stenosis, and severe right M1 and P2 stenosis as well as azygous ACAs. Carotid Doppler and CTA neck showed left ICA high-grade critical stenosis. EF 65-70%. DVT negative. LDL 130, A1C 5.7. Her stroke is considered due to left ICA stenosis. [facility] Dr. Donzetta Matters consulted and had left CEA on 04/07/40 without complication. She was discharged to CIR with DAPT and lipitor 80.   Interval History During the interval time, the patient has been doing well. Stayed in CIR for about 2 week and then doing home PT/OT. Recently referred to outpt PT/OT. Followed with Dr. Donzetta Matters and doing well. Repeat CUS in 6 months. Right hand weakness much improved. BP high today 180/91 and daughter said BP at home 140-170s. Dr. Naaman Plummer just increased amlodipine dose to 10mg . Her lisinopril only at 10mg .    REVIEW OF SYSTEMS: Full 14 system review of systems performed and notable only for those listed below and in HPI above, all others are negative:  Constitutional:   Cardiovascular:  Ear/Nose/Throat:   Skin:  Eyes:   Respiratory:   Gastroitestinal:   Genitourinary:  Hematology/Lymphatic:   Endocrine:  Musculoskeletal:  Neck stiffness, walking difficulty Allergy/Immunology:   Neurological:  Memory loss, dizziness, weakness, tremors Psychiatric: nervous Sleep: restless leg, insomnia  The following represents the patient's updated allergies and  side effects list: No Known Allergies  The neurologically relevant items on the patient's problem list were reviewed on today's visit.  Neurologic Examination  A problem focused neurological exam (12 or more points of the single system neurologic examination, vital signs counts as 1 point, cranial nerves count for 8 points) was performed.  Blood pressure (!) 180/91, pulse 81, height 5\' 4"  (1.626 m), weight 153 lb 3.2 oz (69.5 kg).  General - Well nourished, well developed, in no apparent distress.  Ophthalmologic - Fundi not visualized due to eye movement.  Cardiovascular - Regular rate and rhythm with no murmur.  Mental Status -  Level of arousal and orientation to time, place, and person were intact. Language including expression, naming, repetition, comprehension was assessed and found intact. Fund of Knowledge was assessed and was intact.  Cranial Nerves II - XII - II - Visual field intact OU. III, IV, VI - Extraocular movements intact. V - Facial sensation intact bilaterally. VII - Facial movement intact bilaterally. VIII - Hearing & vestibular intact bilaterally. X - Palate elevates symmetrically. XI - Chin turning & shoulder shrug intact bilaterally. XII - Tongue protrusion intact.  Motor Strength - The patient's strength was normal in all extremities except R bicep tricep 5-/5, right hand grip 3/5 and pronator drift was absent.  Bulk was normal and fasciculations were absent.   Motor Tone - Muscle tone was assessed at the neck and appendages and was normal.  Reflexes - The patient's reflexes were 1+ in all extremities and she had no pathological reflexes.  Sensory - Light touch, temperature/pinprick were assessed and  were normal.    Coordination - The patient had normal movements in the hand with no ataxia or dysmetria.  Tremor was absent.  Gait and Station - slow and cautious gait, but steady   Functional score  mRS = 2   0 - No symptoms.   1 - No  significant disability. Able to carry out all usual activities, despite some symptoms.   2 - Slight disability. Able to look after own affairs without assistance, but unable to carry out all previous activities.   3 - Moderate disability. Requires some help, but able to walk unassisted.   4 - Moderately severe disability. Unable to attend to own bodily needs without assistance, and unable to walk unassisted.   5 - Severe disability. Requires constant nursing care and attention, bedridden, incontinent.   6 - Dead.   NIH Stroke Scale = 0   Data reviewed: I personally reviewed the images and agree with the radiology interpretations.  Ct Head Wo Contrast 04/01/2017 Chronic white matter ischemic change. Lacunar infarcts are noted. No acute abnormality is seen.   Mr [name] Head/brain Wo Cm 04/01/2017 1. Patchy acute infarcts in the left greater than right cerebral hemispheres with distribution suggesting watershed ischemia.  2. Age advanced chronic Carranza vessel ischemic disease with numerous chronic infarcts as above.  3. Mild prominence of the pituitary gland for age. Consider laboratory correlation.  4. Advanced intracranial atherosclerosis as above including severe right and moderate left ICA stenoses, severe bilateral vertebral artery stenoses, and severe right P2 PCA stenosis.   Mr Chest W Wo Contrast 04/02/2017 1. No significant abnormality of the brachial plexus is identified.  2. Cervical and thoracic spondylosis.  3. 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   Mr Cervical Spine Wo Contrast 04/01/2017 Mild cervical disc degeneration without high-grade stenosis as above.   CUS Right - 1% to 39% ICA stenosis. Vertebral artery flow is antegrade. Left -Greater than 80% internal carotid artery stenosis. Vertebral artery flow is antegrade.   TTE - Vigorous LV systolic function; mild LVH; mild diastolic dysfunction; elevated LV filling pressure; sclerotic aortic  valve with no AS or AI; trace MR and TR.  LE venous doppler Bilateral: No evidence of DVT, superficial thrombosis, or Baker's Cyst.  Component     Latest Ref Rng & Units 04/02/2017  Cholesterol     0 - 200 mg/dL 211 (H)  Triglycerides     <150 mg/dL 59  HDL Cholesterol     >40 mg/dL 69  Total CHOL/HDL Ratio     RATIO 3.1  VLDL     0 - 40 mg/dL 12  LDL (calc)     0 - 99 mg/dL 130 (H)  Hemoglobin A1C     4.8 - 5.6 % 5.7 (H)  Mean Plasma Glucose     mg/dL 117    Assessment: As you may recall, she is a 71 y.o. African American female with PMH of previous strokes admitted on 04/01/17 for left MCA and b/l ACA patchy acute infarcts. MRA and CTA head showed severe right tandem and moderate left ICA siphon stenosis, severe bilateral VA stenosis, and severe right M1 and P2 stenosis as well as azygous ACAs. Carotid Doppler and CTA neck showed left ICA high-grade critical stenosis. EF 65-70%. DVT negative. LDL 130, A1C 5.7. Her stroke is considered due to left ICA stenosis. [facility] Dr. Donzetta Matters consulted and had left CEA on 06/29/55 without complication. She was discharged to CIR with DAPT and lipitor 80.  During the interval time, Right hand weakness much improved. BP still high. Follows with Dr. Donzetta Matters at [facility] and repeat CUS in 6 months.     Plan:  - continue plavix and lipitor for stroke prevention - discontinue ASA - check BP at home and record and discuss with PCP for BP meds adjustment - your lisinopril does still low and we can go up if BP at home still high - Follow up with your primary care physician for stroke risk factor modification. Recommend maintain blood pressure goal 120-140/80, diabetes with hemoglobin A1c goal below 7.0% and lipids with LDL cholesterol goal below 70 mg/dL.  - continue to follow up with Dr. Donzetta Matters  - continue outpt PT and self exercise about right hand - healthy diet and regular exercise  - follow up in 3-4 months.  I spent more than 25 minutes of face to face time  with the patient. Greater than 50% of time was spent in counseling and coordination of care. We discussed BP management, d/c ASA, follow up with Dr. Donzetta Matters   No orders of the defined types were placed in this encounter.   No orders of the defined types were placed in this encounter.   Patient Instructions  - continue plavix and lipitor for stroke prevention - discontinue ASA - check BP at home and record and discuss with PCP for BP meds adjustment - your lisinopril does still low and we can go up if BP at home still high - Follow up with your primary care physician for stroke risk factor modification. Recommend maintain blood pressure goal 120-140/80, diabetes with hemoglobin A1c goal below 7.0% and lipids with LDL cholesterol goal below 70 mg/dL.  - continue to follow up with Dr. Donzetta Matters  - continue outpt PT and self exercise about right hand - healthy diet and regular exercise  - follow up in 3-4 months.    Rosalin Hawking, MD PhD [facility] [address] [place], [place] [address] [phone]

## 2017-07-01 NOTE — Patient Instructions (Addendum)
-   continue plavix and lipitor for stroke prevention - discontinue ASA - check BP at home and record and discuss with PCP for BP meds adjustment - your lisinopril does still low and we can go up if BP at home still high - Follow up with your primary care physician for stroke risk factor modification. Recommend maintain blood pressure goal 120-140/80, diabetes with hemoglobin A1c goal below 7.0% and lipids with LDL cholesterol goal below 70 mg/dL.  - continue to follow up with Dr. Donzetta Matters  - continue outpt PT and self exercise about right hand - healthy diet and regular exercise  - follow up in 3-4 months.

## 2017-07-02 ENCOUNTER — Encounter: Payer: BLUE CROSS/BLUE SHIELD | Admitting: Occupational Therapy

## 2017-07-02 ENCOUNTER — Ambulatory Visit: Payer: BLUE CROSS/BLUE SHIELD

## 2017-07-02 DIAGNOSIS — I69319 Unspecified symptoms and signs involving cognitive functions following cerebral infarction: Secondary | ICD-10-CM | POA: Diagnosis not present

## 2017-07-02 DIAGNOSIS — E782 Mixed hyperlipidemia: Secondary | ICD-10-CM | POA: Diagnosis not present

## 2017-07-02 DIAGNOSIS — I69351 Hemiplegia and hemiparesis following cerebral infarction affecting right dominant side: Secondary | ICD-10-CM | POA: Diagnosis not present

## 2017-07-02 DIAGNOSIS — Z7902 Long term (current) use of antithrombotics/antiplatelets: Secondary | ICD-10-CM | POA: Diagnosis not present

## 2017-07-02 DIAGNOSIS — Z7982 Long term (current) use of aspirin: Secondary | ICD-10-CM | POA: Diagnosis not present

## 2017-07-02 DIAGNOSIS — I10 Essential (primary) hypertension: Secondary | ICD-10-CM | POA: Diagnosis not present

## 2017-07-10 ENCOUNTER — Ambulatory Visit: Payer: BLUE CROSS/BLUE SHIELD | Attending: Physical Medicine & Rehabilitation | Admitting: Rehabilitation

## 2017-07-10 ENCOUNTER — Ambulatory Visit: Payer: BLUE CROSS/BLUE SHIELD | Admitting: Occupational Therapy

## 2017-07-10 ENCOUNTER — Encounter: Payer: Self-pay | Admitting: Rehabilitation

## 2017-07-10 ENCOUNTER — Ambulatory Visit: Payer: BLUE CROSS/BLUE SHIELD | Admitting: Speech Pathology

## 2017-07-10 VITALS — BP 190/82

## 2017-07-10 DIAGNOSIS — R278 Other lack of coordination: Secondary | ICD-10-CM

## 2017-07-10 DIAGNOSIS — I69318 Other symptoms and signs involving cognitive functions following cerebral infarction: Secondary | ICD-10-CM | POA: Insufficient documentation

## 2017-07-10 DIAGNOSIS — R2689 Other abnormalities of gait and mobility: Secondary | ICD-10-CM

## 2017-07-10 DIAGNOSIS — M6281 Muscle weakness (generalized): Secondary | ICD-10-CM | POA: Insufficient documentation

## 2017-07-10 DIAGNOSIS — R293 Abnormal posture: Secondary | ICD-10-CM

## 2017-07-10 DIAGNOSIS — R41841 Cognitive communication deficit: Secondary | ICD-10-CM

## 2017-07-10 DIAGNOSIS — I69351 Hemiplegia and hemiparesis following cerebral infarction affecting right dominant side: Secondary | ICD-10-CM | POA: Insufficient documentation

## 2017-07-10 DIAGNOSIS — R482 Apraxia: Secondary | ICD-10-CM

## 2017-07-10 DIAGNOSIS — M25511 Pain in right shoulder: Secondary | ICD-10-CM | POA: Insufficient documentation

## 2017-07-10 DIAGNOSIS — R2681 Unsteadiness on feet: Secondary | ICD-10-CM | POA: Insufficient documentation

## 2017-07-10 NOTE — Therapy (Signed)
[facility] [address] [place], [place], [address] Phone: [phone]   Fax:  [phone]  Occupational Therapy Evaluation  Patient Details  Name: [name] [name] MRN: [id number] Date of Birth: [birth year] Referring Provider: Dr. Donetta Potts  Encounter Date: 07/10/2017      OT End of Session - 07/10/17 1656    Visit Number 1   Number of Visits 14   Date for OT Re-Evaluation 08/28/17   Authorization Type BC/BS MCR - G code required   Authorization Time Period At evaluation - 14 visits remaining for OT    Authorization - Visit Number 1   Authorization - Number of Visits 10   OT Start Time 8921   OT Stop Time 1530   OT Time Calculation (min) 45 min   Activity Tolerance Patient tolerated treatment well      Past Medical History:  Diagnosis Date  . Hypertension   . Stroke [facility])     Past Surgical History:  Procedure Laterality Date  . ENDARTERECTOMY Left 04/08/2017   Procedure: ENDARTERECTOMY CAROTID;  Surgeon: Waynetta Sandy, MD;  Location: [facility];  Service: Vascular;  Laterality: Left;  . PATCH ANGIOPLASTY Left 04/08/2017   Procedure: PATCH ANGIOPLASTY Left Carotid;  Surgeon: Waynetta Sandy, MD;  Location: [facility];  Service: Vascular;  Laterality: Left;    There were no vitals filed for this visit.      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 No/denies           [facility] OT Assessment - 07/10/17 1451      Assessment   Diagnosis CVA (Acute bilateral watershed infarction with Lt side of brain worse than Rt)   Rt dominant hemiparesis   Referring Provider Dr. Donetta Potts   Onset Date 04/01/17   Prior  Therapy CIR 04/09/17 - 04/20/17, HH therapies     Precautions   Precautions Fall   Precaution Comments no driving     Balance Screen   Has the patient fallen in the past 6 months --  see PT eval     Home  Environment   Bathroom Shower/Tub Tub/Shower unit;Curtain  at pt's house, sponge bathing currently at daughters [place] - 4 wheels   Additional Comments Pt has been living with daughter since stroke in 2 story home but your bedroom and 1/2 bath is on first floor. (Pt's house is 1 story with 4 steps to enter)    Lives With Daughter  since stroke     Prior Function   Level of Independence Independent  and living alone   Vocation Retired   Leisure Read, puzzles     ADL   Eating/Feeding Modified independent  primarily using Lt non dominant hand   Grooming Modified independent  assist to braid/style hair only   Upper Body Bathing Minimal assistance  for back (sponge bathing)    Lower Body Bathing Modified independent  sponge bathing   Upper Body Dressing Increased time   Lower Body Dressing Modified independent  wears slip on shoes however (unable to tie shoes)    Firefighter -  Hygiene  Independent   Tub/Shower Transfer --  n/a - sponge bathing     IADL   Shopping --  has not yet attempted    Light Housekeeping Does not participate in any housekeeping tasks   Meal Prep Needs to have meals prepared and served   [employer] on family or friends for transportation   Medication Management Is responsible for taking medication in correct dosages at correct time   Financial Management Dependent     Mobility   Mobility Status Independent     Written Expression   Dominant Hand Right   Handwriting 75% legible  name only     Vision - History   Baseline Vision --  wore glasses fro driving   Visual History --  none   Additional Comments reports no changes since  stroke     Cognition   Overall Cognitive Status Impaired/Different from baseline  See speech eval for details     Observation/Other Assessments   Observations spells world forward w/o difficulty, backwards w/ max difficulty and 1 error. Immediate recall 3/3, Delayed recall: 2/3     Sensation   Light Touch Appears Intact     Coordination   9 Hole Peg Test Right;Left   Right 9 Hole Peg Test 50 sec   Left 9 Hole Peg Test 32.29 sec     Perception   Perception Not tested     Praxis   Praxis Impaired   Praxis Impairment Details Motor planning     Edema   Edema none     Tone   Assessment Location --  none in UE     ROM / Strength   AROM / PROM / Strength AROM     AROM   Overall AROM Comments LUE AROM WNL's. RUE: Shoulder motion approx. 90% (lacks end range), full composite flex 90%, ext 100%     Strength   Overall Strength Comments LUE WFL's (grossly 4 to 4+/5, RUE sh. flex 4/5, abd 3+/5, ER/IR 4/5     Hand Function   Right Hand Grip (lbs) 10 lbs   Left Hand Grip (lbs) 58 lbs                           OT Short Term Goals - 07/10/17 1703      OT SHORT TERM GOAL #1   Title Independent with coordination and putty HEP    Time 3   Period Weeks   Status New   Target Date 07/31/17     OT SHORT TERM GOAL #2   Title Pt to tie shoes or able to wear tie up shoes with A/E prn   Time 3   Period Weeks   Status New     OT SHORT TERM GOAL #3   Title pt to safely perform tub/shower transfer with DME prn   Time 3   Period Weeks   Status New     OT SHORT TERM GOAL #4   Title Pt to perform simple cold meal, microwaveable meals, and snack prep mod I level consistently    Time 3   Period Weeks   Status New     OT SHORT TERM GOAL #5   Title Pt to consistently participate in light household tasks (folding clothes, washing dishes)   Time 3   Period Weeks   Status New           OT Long Term Goals - 07/10/17 4496  OT LONG TERM GOAL #1    Title Independent with updated HEP    Time 7   Period Weeks   Status New   Target Date 08/28/17     OT LONG TERM GOAL #2   Title Pt to improve coordination Rt hand as evidenced by reducing speed on 9 hole peg test to 38 sec. or under   Baseline eval: 50 sec   Time 7   Period Weeks   Status New     OT LONG TERM GOAL #3   Title Pt to improve grip strength Rt dominant hand to 25 lbs or greater to assist with opening jars/containers   Baseline eval: 10 lbs (Lt = 58 lbs)    Time 7   Period Weeks   Status New     OT LONG TERM GOAL #4   Title Pt to retrieve/replace 2 lb. object from high level shelf RUE with no reports of pain   Time 7   Period Weeks   Status New     OT LONG TERM GOAL #5   Title Pt to make simple meal mod I level safely   Time 7   Period Weeks   Status New     Long Term Additional Goals   Additional Long Term Goals Yes     OT LONG TERM GOAL #6   Title Pt/family to verbalize understanding with necessary DME, modifications, and external aids/devices to put in place if pt were to return to living alone   Time 7   Period Weeks   Status New               Plan - 07/10/17 1658    Clinical Impression Statement Pt is a 71 y.o. female who presents to outpatient rehab s/p acute bilateral watershed infarctions on 04/01/17 which affected Rt dominant side more. Pt also with Lt carotid endarterectomy on 04/08/17. Pt with h/o of Diveley infarcts and chronic Wiegel vessel disease. Pt now presents with decreased coordination, strength Rt dominant side, as well as mild apraxia and cognitive deficits.    Occupational Profile and client history currently impacting functional performance Pt with significant PMH including multiple infarcts (mild) and chronic Stalzer vessel disease. Pt was living alone and independent prior to stroke on 04/01/17 and currently having to live with daughter d/t deficits.    Occupational performance deficits (Please refer to evaluation for details):  ADL's;IADL's;Leisure;Social Participation   Rehab Potential Good   Current Impairments/barriers affecting progress: chronic Sakai vessel disease making her more prone to future strokes   OT Frequency 2x / week   OT Duration --  7 weeks   OT Treatment/Interventions Self-care/ADL training;Moist Heat;DME and/or AE instruction;Splinting;Patient/family education;Therapeutic exercises;Therapeutic activities;Passive range of motion;Neuromuscular education;Functional Mobility Training;Manual Therapy;Visual/perceptual remediation/compensation;Cognitive remediation/compensation   Plan issue putty and coordination HEP    Clinical Decision Making Several treatment options, min-mod task modification necessary   Consulted and Agree with Plan of Care Patient      Patient will benefit from skilled therapeutic intervention in order to improve the following deficits and impairments:  Decreased coordination, Decreased range of motion, Impaired flexibility, Decreased safety awareness, Decreased endurance, Impaired sensation, Decreased knowledge of precautions, Impaired UE functional use, Pain, Decreased mobility, Decreased strength, Decreased cognition, Impaired vision/preception  Visit Diagnosis: Hemiplegia and hemiparesis following cerebral infarction affecting right dominant side ([place]) - Plan: Ot plan of care cert/re-cert  Muscle weakness (generalized) - Plan: Ot plan of care cert/re-cert  Other lack of coordination - Plan: Ot plan  of care cert/re-cert  Apraxia - Plan: Ot plan of care cert/re-cert  Acute pain of right shoulder - Plan: Ot plan of care cert/re-cert  Other symptoms and signs involving cognitive functions following cerebral infarction - Plan: Ot plan of care cert/re-cert      G-Codes - 02/63/78 1711    Functional Assessment Tool Used (Outpatient only) RUE: based on clinical findings during eval   Functional Limitation Carrying, moving and handling objects   Carrying, Moving and Handling  Objects Current Status (H8850) At least 40 percent but less than 60 percent impaired, limited or restricted   Carrying, Moving and Handling Objects Goal Status (Y7741) At least 1 percent but less than 20 percent impaired, limited or restricted      Problem List Patient Active Problem List   Diagnosis Date Noted  . Glucose intolerance (impaired glucose tolerance) 05/01/2017  . Depression 05/01/2017  . Hypertensive crisis   . Acute bilat watershed infarction [facility]) 04/09/2017  . Intracranial vascular stenosis   . Hemiparesis affecting dominant side as late effect of stroke ([place])   . Post-operative pain   . S/P carotid endarterectomy   . Essential hypertension   . Stenosis of left carotid artery   . Hyperlipidemia   . Gait disturbance, post-stroke 04/02/2017  . Right arm weakness   . Slowness and poor responsiveness   . Hypertensive emergency 04/01/2017  . Hypokalemia 04/01/2017  . Hyperglycemia 04/01/2017  . Stroke-like symptoms 04/01/2017  . 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 ([place]) 04/01/2017    Carey Bullocks, OTR/L 07/10/2017, 5:17 PM  [facility] [address] [place], [place], [address] Phone: [phone]   Fax:  [phone]  Name: [name] [name] MRN: [id number] Date of Birth: 1946/03/22

## 2017-07-10 NOTE — Patient Instructions (Signed)
   Cognitive Activities you can do at home:   - [place] (easy level)  - [place]  On your computer, tablet or phone: [place]  Get a binder with sections for PT, OT and Speech - also put your schedule in it.

## 2017-07-10 NOTE — Therapy (Signed)
[facility] [address] [place], [place], [address] Phone: [phone]   Fax:  [phone]  Speech Language Pathology Evaluation  Patient Details  Name: [name] [name] MRN: [id number] Date of Birth: May 02, 1946 Referring Provider: Dr. Alger Simons  Encounter Date: 07/10/2017      End of Session - 07/10/17 1728    Visit Number 1   Number of Visits 17   Date for SLP Re-Evaluation 09/05/17   Authorization Type Blue medicare - may need [employer]   SLP Start Time 0093   SLP Stop Time  1445   SLP Time Calculation (min) 43 min   Activity Tolerance Patient tolerated treatment well      Past Medical History:  Diagnosis Date  . Hypertension   . Stroke [facility])     Past Surgical History:  Procedure Laterality Date  . ENDARTERECTOMY Left 04/08/2017   Procedure: ENDARTERECTOMY CAROTID;  Surgeon: Waynetta Sandy, MD;  Location: [facility];  Service: Vascular;  Laterality: Left;  . PATCH ANGIOPLASTY Left 04/08/2017   Procedure: PATCH ANGIOPLASTY Left Carotid;  Surgeon: Waynetta Sandy, MD;  Location: [place];  Service: Vascular;  Laterality: Left;    There were no vitals filed for this visit.      Subjective Assessment - 07/10/17 1451    Subjective "I think I'm doing Ok, but my daughter says I'm forgetting things   Currently in Pain? No/denies            SLP Evaluation [facility] - 07/10/17 1451      SLP Visit Information   SLP Received On 07/10/17   Referring Provider Dr. Alger Simons   Onset Date Apr 01, 2017   Medical Diagnosis Bilateral watershed CVA     Subjective   Patient/Family Stated Goal To live independently     General Information   HPI Presented 04/01/2017 with right-sided weakness and unsteady gait 2 days. CT/MRI reviewed, showing bilateral Kiener infarcts.  Per report, patchy acute infarcts in the left greater than right cerebral hemispheres with distribution suggesting  watershed ischemia. Advanced Berman vessel ischemic changes. MRA with atherosclerotic type changes including severe right and moderate left ICA stenosis, severe bilateral vertebral artery stenosis.Carotid Doppler showed 1-39% right ICA stenosis and greater than 80% left ICA stenosis. MRI cervical spine with mild cervical disc degeneration. MRI of the chest negative. Patient did not receive TPA. She received ST on CIR. Hospitalized 04/01/17 to 04/20/17.   Mobility Status PT eval today - see PT report     Prior Functional Status   Cognitive/Linguistic Baseline Within functional limits   Type of Home House    Lives With Alone   Available Support Family   Vocation Full time employment     Cognition   Overall Cognitive Status Impaired/Different from baseline   Area of Impairment Orientation;Attention;Memory;Problem solving;Following commands   Current Attention Level Sustained   Memory Decreased short-term memory   Following Commands Follows multi-step commands inconsistently   Problem Solving Slow processing;Decreased initiation;Difficulty sequencing;Requires verbal cues   Attention Sustained   Sustained Attention Impaired   Sustained Attention Impairment Verbal basic;Functional basic   Memory Impaired   Memory Impairment Storage deficit;Decreased recall of new information;Decreased short term memory   Decreased Short Term Memory Verbal basic;Functional basic   Awareness Impaired   Awareness Impairment Intellectual impairment   Problem Solving Impaired   Problem Solving Impairment Verbal basic;Functional basic   Corporate treasurer;Sequencing;Decision Making;Organizing;Initiating;Self Monitoring   Behaviors Poor frustration tolerance  Auditory Comprehension   Overall Auditory Comprehension Impaired   Yes/No Questions Impaired   Complex Questions 50-74% accurate   Commands Impaired   Multistep Basic Commands 50-74% accurate   Conversation Simple   Other Conversation Comments  Reduced auditory comprehension/processing vs attention   Inter[employer]ing [employer]onents Attention;Processing speed;Working memory   [employer];Slowed speech;Extra processing time;Increased volume     Oral Motor/Sensory Function   Overall Oral Motor/Sensory Function Appears within functional limits for tasks assessed     Motor Speech   Overall Motor Speech Appears within functional limits for tasks assessed     Standardized Assessments   Standardized Assessments  Montreal Cognitive Assessment ([place])   Montreal Cognitive Assessment ([place])  15/30                         SLP Education - 07/10/17 1504    Education provided Yes   Education Details results of ST evaluation, goals of therpay, cognitive activities to do at home   Person(s) Educated Patient          SLP Short Term Goals - 07/10/17 1737      SLP SHORT TERM GOAL #1   Title Pt will selectively attend to simple cogntive linguistic problem solving task in mildly distracting environment with 90% accuracy and occasional min A   Time 4   Period Weeks   Status New     SLP SHORT TERM GOAL #2   Title Pt will utilize external aids to manage schedule, medications, and be oriented with occasional min A over 3 sessions   Time 4   Period Weeks   Status New     SLP SHORT TERM GOAL #3   Title Pt will solve simple functional time/money problems with 85% accuracy and occasional min A   Time 4   Period Weeks   Status New     SLP SHORT TERM GOAL #4   Title Pt will follow  3 step detailed instructions/questions on cognitive linguistic tasks with less than 3 requests for repeat out of 5 trials.    Time 4   Period Weeks   Status New          SLP Long Term Goals - 07/10/17 1741      SLP LONG TERM GOAL #1   Title Pt will utlize external aids to manage schedule, meds, and finances with occasional min A over 2 sessionsl    Time 8   Period Weeks   Status New     SLP LONG TERM GOAL  #2   Title Pt will alternate attention between 2 mildly complex cognitive linguistic tasks with 85% on each and occasional min A   Time 8   Period Weeks   Status New     SLP LONG TERM GOAL #3   Title Pt will solve mildly complex reasoning, organization, time/money problems with 85% accuracy and occasional min A.   Time 8   Period Weeks   Status New          Plan - 07/10/17 1744    Clinical Impression Statement Mrs. Wisnewski suffered a bilateral watershed CVA and is referred for outpt ST due to ongoing cognitive linguistic impairments.  Prior to this CVA, pt lived indpendently, worked full time and managed her finances, schedule and medications indpedently. She is living with her daughter while she recovers. Today, she presents with moderate cogntivie linguistic deficits. She scored a 15/30 on the [facility] Cognitive Assessment (26/30 is  WFL). She recaled 4/5 words immediately, and 1/5 with a delay. Initially, pt oriented to the date with extended time, however, later on, during check writing task, she could not recall the date. Her daughter is managing her schedule and finances at this time. Sustained attention impaired with series 7 subtractions and check writing task. Executive function impaired on trail making, clock drawing and figure copying., Inconsistent awareness of errors, however when she noted error, she did not attempt to correct it. Auditory processing is also impaired for multi step or detailed instructoions. Difficulty to determine auditory processing vs attention to details. Simple functional time and money math problems were not solved - pt stated that she should be able to do these. Overall flat affect and mildly reduced eye contact.  I recommend skilled ST to maximize cognitive for pt to eventually return to living indpendently.      Patient will benefit from skilled therapeutic intervention in order to improve the following deficits and impairments:   Cognitive communication  deficit - Plan: SLP plan of care cert/re-cert      G-Codes - 86/76/19 1744    Functional Limitations Attention   Attention Current Status (J0932) At least 60 percent but less than 80 percent impaired, limited or restricted   Attention Goal Status (I7124) At least 40 percent but less than 60 percent impaired, limited or restricted      Problem List Patient Active Problem List   Diagnosis Date Noted  . Glucose intolerance (impaired glucose tolerance) 05/01/2017  . Depression 05/01/2017  . Hypertensive crisis   . Acute bilat watershed infarction [facility]) 04/09/2017  . Intracranial vascular stenosis   . Hemiparesis affecting dominant side as late effect of stroke ([place])   . Post-operative pain   . S/P carotid endarterectomy   . Essential hypertension   . Stenosis of left carotid artery   . Hyperlipidemia   . Gait disturbance, post-stroke 04/02/2017  . Right arm weakness   . Slowness and poor responsiveness   . Hypertensive emergency 04/01/2017  . Hypokalemia 04/01/2017  . Hyperglycemia 04/01/2017  . Stroke-like symptoms 04/01/2017  . 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 ([place]) 04/01/2017    Lovvorn, Annye Rusk MS, CCC-SLP 07/10/2017, 5:46 PM  [facility] [address] [place], [place], [address] Phone: [phone]   Fax:  [phone]  Name: [name] [name] MRN: [id number] Date of Birth: 1946-10-10

## 2017-07-10 NOTE — Therapy (Signed)
[facility] [address] [place], [place], [address] Phone: [phone]   Fax:  [phone]  Physical Therapy Evaluation  Patient Details  Name: [name] [name] MRN: [id number] Date of Birth: 1946-02-23 Referring Provider: Alger Simons, MD  Encounter Date: 07/10/2017      PT End of Session - 07/10/17 1956    Visit Number 1   Number of Visits 14   Date for PT Re-Evaluation 08/28/17   Authorization Type [employer] 14 Visits left for PT   Authorization - Visit Number 1   Authorization - Number of Visits 14   PT Start Time 1316   PT Stop Time 1400   PT Time Calculation (min) 44 min   Activity Tolerance Patient tolerated treatment well;Treatment limited secondary to medical complications (Comment)  did not do more balance w/ pt due to BP   Behavior During Therapy [facility] for tasks assessed/performed      Past Medical History:  Diagnosis Date  . Hypertension   . Stroke [facility])     Past Surgical History:  Procedure Laterality Date  . ENDARTERECTOMY Left 04/08/2017   Procedure: ENDARTERECTOMY CAROTID;  Surgeon: Waynetta Sandy, MD;  Location: [place];  Service: Vascular;  Laterality: Left;  . PATCH ANGIOPLASTY Left 04/08/2017   Procedure: PATCH ANGIOPLASTY Left Carotid;  Surgeon: Waynetta Sandy, MD;  Location: [place];  Service: Vascular;  Laterality: Left;    Vitals:   07/10/17 1942  BP: (!) 190/82         Subjective Assessment - 07/10/17 1322    Subjective "I had a stroke and I still need to work on climbing stairs."     Patient is accompained by: Family member  [name], daughter   Limitations House hold activities;Walking   Patient Stated Goals "To be back to where I was physically."    Currently in Pain? No/denies            [facility] PT Assessment - 07/10/17 0001      Assessment   Medical Diagnosis CVA   Referring Provider Alger Simons, MD   Onset Date/Surgical Date 04/01/17   Prior  Therapy acute, IP rehab, HHPT/OT     Precautions   Precautions Fall     Restrictions   Weight Bearing Restrictions No     Balance Screen   Has the patient fallen in the past 6 months No   Has the patient had a decrease in activity level because of a fear of falling?  Yes   Is the patient reluctant to leave their home because of a fear of falling?  Yes     [place] Private residence   Living Arrangements Children  daughter   Available Help at Discharge Family;Available PRN/intermittently  at home only 1-hours alone, otherwise has S   Type of [place] to enter   Entrance Stairs-Number of Steps 1   Entrance Stairs-Rails None   Home Layout Two level;Able to live on main level with bedroom/bathroom   Alternate Level Stairs-Number of Steps 16   Alternate Level Stairs-Rails Right;Left;Can reach both   [place] - 2 wheels;Cane - single point  has tub shower at her home and daughters   Additional Comments Note that she has not gotten into shower at daughter's home due to difficulty with stairs.       Prior Function   Level of Independence Independent   Vocation Retired   Corporate investment banker,  puzzles     Cognition   Overall Cognitive Status Impaired/Different from baseline   Area of Impairment Attention;Memory;Following commands   Current Attention Level Sustained   Memory Decreased short-term memory   Following Commands Follows multi-step commands inconsistently   Problem Solving Slow processing     Observation/Other Assessments   Focus on Therapeutic Outcomes (FOTO)  Neuro QOL-LE 38.7%     Sensation   Light Touch Impaired Detail   Light Touch Impaired Details Impaired RLE  describes as feeling "heavy"    Hot/Cold Appears Intact   Proprioception Appears Intact     Coordination   Gross Motor Movements are Fluid and Coordinated Yes   Fine Motor Movements are Fluid and Coordinated Yes     ROM / Strength   AROM /  PROM / Strength Strength     Strength   Overall Strength Deficits   Overall Strength Comments LLE WFL (4/5), R hip flex 3+/5, R knee flex 3+/5, R ankle DF 3+/5, otherwise 4/5      Transfers   Transfers Sit to Stand;Stand to Sit   Sit to Stand 5: Supervision   Sit to Stand Details Verbal cues for sequencing;Verbal cues for technique   Five time sit to stand comments  19.31 secs with single UE support on arm rest   Stand to Sit 5: Supervision   Stand to Sit Details (indicate cue type and reason) Verbal cues for sequencing;Verbal cues for technique     Ambulation/Gait   Ambulation/Gait Yes   Ambulation/Gait Assistance 5: Supervision;4: Min guard   Ambulation/Gait Assistance Details Pt with very slow gait speed and requires min/guard at times, esp in busy areas.    Ambulation Distance (Feet) 115 Feet   Assistive device None   Gait Pattern Step-through pattern;Decreased stride length;Decreased arm swing - right;Decreased arm swing - left;Shuffle;Trunk flexed;Poor foot clearance - right   Ambulation Surface Level;Indoor   Gait velocity 1.70 ft/sec    Stairs Yes   Stairs Assistance 5: Supervision   Stair Management Technique Two rails;Step to pattern;Forwards   Number of Stairs 4  x 2 reps   Height of Stairs 6            Objective measurements completed on examination: See above findings.                  PT Education - 07/10/17 1955    Education provided Yes   Education Details evaluation findings, simulating getting in/out of tub/shower and stairs with recommendation to begin at home with assist from daughter, POC, goals.     Person(s) Educated Patient   Methods Explanation   Comprehension Verbalized understanding          PT Short Term Goals - 07/10/17 2005      PT SHORT TERM GOAL #1   Title Pt/daughter will be independent with initial HEP in order to indicate improved functional mobility and balance.    Time 3   Period Weeks   Status New   Target  Date 07/31/17     PT SHORT TERM GOAL #2   Title Pt will improve gait speed to 2.30 ft/sec w/ LRAD in order to indicate decreased fall risk.     Time 3   Period Weeks   Status New   Target Date 07/31/17     PT SHORT TERM GOAL #3   Title Pt will improve 5TSS to <16 secs w/ single UE support in order to indicate decreased fall risk and improved functional  strength.     Time 3   Period Weeks   Status New   Target Date 07/31/17     PT SHORT TERM GOAL #4   Title Will assess DGI and write appropriate LTG in order to better assess balance.    Time 3   Period Weeks   Status New   Target Date 07/31/17     PT SHORT TERM GOAL #5   Title Pt will negotiate up/down 16 stairs with B rails at mod I level in order to indicate safety getting to upstairs bathroom.     Time 3   Period Weeks   Status New   Target Date 07/31/17           PT Long Term Goals - 07/10/17 2010      PT LONG TERM GOAL #1   Title Pt/daughter will be independent with final HEP in order to indicate improved functional mobility and decreased fall risk.     Time 7   Period Weeks   Status New   Target Date 08/28/17     PT LONG TERM GOAL #2   Title Pt will improve 5TSS to <15 secs without UE support in order to indicate decreased fall risk and improved functional strength.     Time 7   Period Weeks   Status New   Target Date 08/28/17     PT LONG TERM GOAL #3   Title Pt will demonstrate ability to step into/out of tub with light UE support (on wall) at mod I level in order to indicate safe return to home.    Time 7   Period Weeks   Status New   Target Date 08/28/17     PT LONG TERM GOAL #4   Title Pt will improve gait speed to >/=2.62 ft/sec in order to indicate decreased fall risk and improved efficiency of gait.     Time 7   Period Weeks   Status New   Target Date 08/28/17     PT LONG TERM GOAL #5   Title Pt will ambulate up to 500' over unlevel paved surfaces (including ramp/curb) at distant S level  (due to cognition) in order to indicate safe return to community.    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  Pt went to IP rehab from 5/30-6/10 and has also received HHPT since this time.  Pt with history significant for HTN and HDL with continued uncontrolled hypertension at times (See vitals section).  Upon PT evaluation, note gait speed 1.70 ft/sec, indicative of recurrent fall risk, generalized weakness (R>L), and decreased balance esp in crowded gym.  Did not do formal balance assessment due to elevated BP during session.  Pt will benefit from skilled OP neuro PT to address deficits.     History and Personal Factors relevant to plan of care: see above   Clinical Presentation Evolving   Clinical Presentation due to: See above   Clinical Decision Making Moderate   Rehab Potential Good   Clinical Impairments Affecting Rehab Potential co-morbidities    PT Frequency 2x / week   PT Duration Other (comment)  7 weeks   PT Treatment/Interventions ADLs/Self Care Home Management;DME  Instruction;Gait training;Stair training;Functional mobility training;Therapeutic activities;Therapeutic exercise;Balance training;Neuromuscular re-education;Cognitive remediation;Patient/family education;Orthotic Fit/Training;Energy conservation;Vestibular   PT Next Visit Plan DGI and update goals, initiate HEP for BLE strength and balance, PT recommended she go upstairs with S from daughter and assist to get into/out of tub-ensure she has done so or go over again with her and daughter.     Consulted and Agree with Plan of Care Patient      Patient will benefit from skilled therapeutic intervention in order to improve the following deficits and impairments:  Abnormal gait, Decreased activity  tolerance, Decreased balance, Decreased cognition, Decreased endurance, Decreased mobility, Decreased strength, Impaired perceived functional ability, Impaired flexibility, Impaired sensation, Improper body mechanics  Visit Diagnosis: Hemiplegia and hemiparesis following cerebral infarction affecting right dominant side (HCC) - Plan: PT plan of care cert/re-cert  Unsteadiness on feet - Plan: PT plan of care cert/re-cert  Abnormal posture - Plan: PT plan of care cert/re-cert  Muscle weakness (generalized) - Plan: PT plan of care cert/re-cert  Other abnormalities of gait and mobility - Plan: PT plan of care cert/re-cert     Problem List Patient Active Problem List   Diagnosis Date Noted  . Glucose intolerance (impaired glucose tolerance) 05/01/2017  . Depression 05/01/2017  . Hypertensive crisis   . Acute bilat watershed infarction [facility]) 04/09/2017  . Intracranial vascular stenosis   . Hemiparesis affecting dominant side as late effect of stroke ([place])   . Post-operative pain   . S/P carotid endarterectomy   . Essential hypertension   . Stenosis of left carotid artery   . Hyperlipidemia   . Gait disturbance, post-stroke 04/02/2017  . Right arm weakness   . Slowness and poor responsiveness   . Hypertensive emergency 04/01/2017  . Hypokalemia 04/01/2017  . Hyperglycemia 04/01/2017  . Stroke-like symptoms 04/01/2017  . 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 ([place]) 04/01/2017    Cameron Sprang, PT, MPT [facility] [address] [place], [place], [address] Phone: [phone]   Fax:  [phone] 07/10/17, 8:18 PM  Name: [name] [name] MRN: [id number] Date of Birth: 05-08-1946

## 2017-07-16 ENCOUNTER — Encounter: Payer: Self-pay | Admitting: Physical Therapy

## 2017-07-16 ENCOUNTER — Ambulatory Visit: Payer: BLUE CROSS/BLUE SHIELD

## 2017-07-16 ENCOUNTER — Ambulatory Visit: Payer: BLUE CROSS/BLUE SHIELD | Attending: Physical Medicine & Rehabilitation | Admitting: Physical Therapy

## 2017-07-16 VITALS — BP 184/90

## 2017-07-16 DIAGNOSIS — R41841 Cognitive communication deficit: Secondary | ICD-10-CM | POA: Diagnosis present

## 2017-07-16 DIAGNOSIS — R2681 Unsteadiness on feet: Secondary | ICD-10-CM | POA: Insufficient documentation

## 2017-07-16 DIAGNOSIS — R278 Other lack of coordination: Secondary | ICD-10-CM | POA: Insufficient documentation

## 2017-07-16 DIAGNOSIS — R2689 Other abnormalities of gait and mobility: Secondary | ICD-10-CM | POA: Diagnosis present

## 2017-07-16 DIAGNOSIS — I69351 Hemiplegia and hemiparesis following cerebral infarction affecting right dominant side: Secondary | ICD-10-CM | POA: Insufficient documentation

## 2017-07-16 DIAGNOSIS — M6281 Muscle weakness (generalized): Secondary | ICD-10-CM | POA: Insufficient documentation

## 2017-07-16 NOTE — Patient Instructions (Signed)
  Please complete the assigned speech therapy homework prior to your next session and return it to the speech therapist at your next visit.  

## 2017-07-16 NOTE — Patient Instructions (Signed)
Perform these at home next to kitchen counter top or sturdy table for balance support:    "I love a Parade" Lift   At counter for balance as needed: high knee marching forward and then backward. 3 second pauses with each knee lift.  Repeat 3 laps each way. Do _1-2_ sessions per day. http://gt2.exer.us/345   Copyright  VHI. All rights reserved.  Walking on Toes    Walk on toes forward while continuing on a straight path, and then backwards on toes to starting position. Repeat 3 laps each way. Do _1-2___ sessions per day.  Copyright  VHI. All rights reserved.  Feet Heel-Toe "Tandem"   Arms at sides, walk a straight line forward bringing one foot directly in front of the other, and then a straight line backwards bringing one foot directly behind the other one.  Repeat for _3 laps each way. Do _1-2_ sessions per day.  Copyright  VHI. All rights reserved.

## 2017-07-17 NOTE — Therapy (Signed)
[facility] [address] [place], [place], [address] Phone: [phone]   Fax:  [phone]  Speech Language Pathology Treatment  Patient Details  Name: [name] [name] MRN: [id number] Date of Birth: [birth year] Referring Provider: Dr. Alger Simons  Encounter Date: 07/16/2017      End of Session - 07/16/17 1409    Visit Number 2   Number of Visits 17   Date for SLP Re-Evaluation 09/05/17   Authorization Type [employer] medic[employer]    Authorization - Visit Number 2   Authorization - Number of Visits 24   SLP Start Time 0272   SLP Stop Time  1447   SLP Time Calculation (min) 42 min   Activity Tolerance Patient tolerated treatment well      Past Medical History:  Diagnosis Date  . Hypertension   . Stroke [facility])     Past Surgical History:  Procedure Laterality Date  . ENDARTERECTOMY Left 04/08/2017   Procedure: ENDARTERECTOMY CAROTID;  Surgeon: Waynetta Sandy, MD;  Location: [place];  Service: Vascular;  Laterality: Left;  . PATCH ANGIOPLASTY Left 04/08/2017   Procedure: PATCH ANGIOPLASTY Left Carotid;  Surgeon: Waynetta Sandy, MD;  Location: [place];  Service: Vascular;  Laterality: Left;    There were no vitals filed for this visit.      Subjective Assessment - 07/16/17 1409    Subjective "I was here last week."   Currently in Pain? No/denies               ADULT SLP TREATMENT - 07/17/17 0001      General Information   Behavior/Cognition Alert;Cooperative;Pleasant mood     Treatment Provided   Treatment provided Cognitive-Linquistic     Cognitive-Linquistic Treatment   Treatment focused on Cognition   Skilled Treatment Simple coin counting task today completed with mod cues occasionally faded to min cues rarely, with extra time consistently. Pt endorsed the task would not have taken her as long premorbidly. Pt without emergent awareness and 70% correct. SLP asked pt to double check  answers, and pt found half of her errors. SLP encouraged pt and mother to have pt double check her answers prior to moving on to next rep/response.     Assessment / Recommendations / Plan   Plan Continue with current plan of care     Progression Toward Goals   Progression toward goals Progressing toward goals          SLP Education - 07/17/17 0806    Education provided Yes   Education Details need for checking work after each response   Person(s) Educated Patient;Parent(s)   Methods Explanation   Comprehension Verbalized understanding;Need further instruction          SLP Short Term Goals - 07/16/17 1455      SLP SHORT TERM GOAL #1   Title Pt will selectively attend to simple cogntive linguistic problem solving task in mildly distracting environment with 90% accuracy and occasional min A   Time 4   Period Weeks   Status On-going     SLP SHORT TERM GOAL #2   Title Pt will utilize external aids to manage schedule, medications, and be oriented with occasional min A over 3 sessions   Time 4   Period Weeks   Status On-going     SLP SHORT TERM GOAL #3   Title Pt will solve simple functional time/money problems with 85% accuracy and occasional min A   Time 4   Period  Weeks   Status On-going     SLP SHORT TERM GOAL #4   Title Pt will follow  3 step detailed instructions/questions on cognitive linguistic tasks with less than 3 requests for repeat out of 5 trials.    Time 4   Period Weeks   Status On-going          SLP Long Term Goals - 07/16/17 1455      SLP LONG TERM GOAL #1   Title Pt will utlize external aids to manage schedule, meds, and finances with occasional min A over 2 sessionsl    Time 8   Period Weeks   Status On-going     SLP LONG TERM GOAL #2   Title Pt will alternate attention between 2 mildly complex cognitive linguistic tasks with 85% on each and occasional min A   Time 8   Period Weeks   Status On-going     SLP LONG TERM GOAL #3   Title Pt  will solve mildly complex reasoning, organization, time/money problems with 85% accuracy and occasional min A.   Time 8   Period Weeks   Status On-going          Plan - 07/16/17 1447    Clinical Impression Statement Pt demonstrates ongoing cognitive linguistic impairments in attention/attention to detail, and error awareness, putting her at high risk for decr'd safety awareness. ttention impaired with series 7 subtractions and check writing task. Executive function impaired on trail making, clock drawing and figure copying., Inconsistent awareness of errors, however when she noted error, she did not attempt to correct it. Auditory processing is also impaired for multi step or detailed instructoions. Difficulty to determine auditory processing vs attention to details.  [place] is recommend to continue to maximize cognitive for pt to eventually return to living indpendently.   Speech Therapy Frequency 2x / week   Duration --  8 weeks of 17 visits   Treatment/Interventions Compensatory strategies;Functional tasks;Patient/family education;Cognitive reorganization;Environmental controls;Internal/external aids;SLP instruction and feedback;Language facilitation   Potential to Achieve Goals Good   Consulted and Agree with Plan of Care Patient      Patient will benefit from skilled therapeutic intervention in order to improve the following deficits and impairments:   Cognitive communication deficit    Problem List Patient Active Problem List   Diagnosis Date Noted  . Glucose intolerance (impaired glucose tolerance) 05/01/2017  . Depression 05/01/2017  . Hypertensive crisis   . Acute bilat watershed infarction [facility]) 04/09/2017  . Intracranial vascular stenosis   . Hemiparesis affecting dominant side as late effect of stroke ([place])   . Post-operative pain   . S/P carotid endarterectomy   . Essential hypertension   . Stenosis of left carotid artery   . Hyperlipidemia   . Gait  disturbance, post-stroke 04/02/2017  . Right arm weakness   . Slowness and poor responsiveness   . Hypertensive emergency 04/01/2017  . Hypokalemia 04/01/2017  . Hyperglycemia 04/01/2017  . Stroke-like symptoms 04/01/2017  . 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 ([place]) 04/01/2017    [facility] ,[place], [place]  07/17/2017, 8:07 AM  [place] [place] [address] [place], [place], [address] Phone: [phone]   Fax:  [phone]   Name: [name] [name] MRN: [id number] Date of Birth: May 28, 1946

## 2017-07-19 NOTE — Therapy (Signed)
[facility] [address] [place], [place], [address] Phone: [phone]   Fax:  [phone]  Physical Therapy Treatment  Patient Details  Name: [name] [name] MRN: [id number] Date of Birth: 25-Dec-1945 Referring Provider: Alger Simons, MD  Encounter Date: 07/16/2017   07/16/17 1540  PT Visits / Re-Eval  Visit Number 2  Number of Visits 14  Date for PT Re-Evaluation 08/28/17  Authorization  Authorization Type BCBS 14 Visits left for PT  Authorization - Visit Number 2  Authorization - Number of Visits 14  PT Time Calculation  PT Start Time 1535  PT Stop Time 1615  PT Time Calculation (min) 40 min  PT - End of Session  Equipment Utilized During Treatment Gait belt  Activity Tolerance Patient tolerated treatment well;Treatment limited secondary to medical complications (Comment) (did not do more balance w/ pt due to BP)  Behavior During Therapy [facility] for tasks assessed/performed     Past Medical History:  Diagnosis Date  . Hypertension   . Stroke [facility])     Past Surgical History:  Procedure Laterality Date  . ENDARTERECTOMY Left 04/08/2017   Procedure: ENDARTERECTOMY CAROTID;  Surgeon: Waynetta Sandy, MD;  Location: [place];  Service: Vascular;  Laterality: Left;  . PATCH ANGIOPLASTY Left 04/08/2017   Procedure: PATCH ANGIOPLASTY Left Carotid;  Surgeon: Waynetta Sandy, MD;  Location: [place];  Service: Vascular;  Laterality: Left;    Vitals:   07/16/17 1556  BP: (!) 184/90    07/16/17 1539  Symptoms/Limitations  Subjective No new complaints. No falls or pain to report.   Limitations House hold activities;Walking  Patient Stated Goals "To be back to where I was physically."   Pain Assessment  Currently in Pain? No/denies     07/16/17 1542  Standardized Balance Assessment  Standardized Balance Assessment Dynamic Gait Index  Dynamic Gait Index  Level Surface 2 (slow speed)  Change in  Gait Speed 2  Gait with Horizontal Head Turns 2  Gait with Vertical Head Turns 3  Gait and Pivot Turn 2  Step Over Obstacle 1  Step Around Obstacles 2  Steps 1  Total Score 15  DGI comment: 15/24. </= 19 is predictive of falls    issued the following to HEP today: Perform these at home next to kitchen counter top or sturdy table for balance support:    "I love a Parade" Lift   At counter for balance as needed: high knee marching forward and then backward. 3 second pauses with each knee lift.  Repeat 3 laps each way. Do _1-2_ sessions per day. http://gt2.exer.us/345   Copyright  VHI. All rights reserved.  Walking on Toes    Walk on toes forward while continuing on a straight path, and then backwards on toes to starting position. Repeat 3 laps each way. Do _1-2___ sessions per day.  Copyright  VHI. All rights reserved.  Feet Heel-Toe "Tandem"   Arms at sides, walk a straight line forward bringing one foot directly in front of the other, and then a straight line backwards bringing one foot directly behind the other one.  Repeat for _3 laps each way. Do _1-2_ sessions per day.  Copyright  VHI. All rights reserved.     07/16/17 1620  PT Education  Education provided Yes  Education Details results of DGI; HEP for balance  Person(s) Educated Patient  Methods Explanation;Demonstration;Verbal cues;Handout  Comprehension Verbalized understanding;Returned demonstration;Verbal cues required;Need further instruction  PT Short Term Goals - 07/17/17 1240      PT SHORT TERM GOAL #1   Title Pt/daughter will be independent with initial HEP in order to indicate improved functional mobility and balance.    Time 3   Period Weeks   Status On-going     PT SHORT TERM GOAL #2   Title Pt will improve gait speed to 2.30 ft/sec w/ LRAD in order to indicate decreased fall risk.     Time 3   Period Weeks   Status On-going     PT SHORT TERM GOAL #3   Title Pt will improve  5TSS to <16 secs w/ single UE support in order to indicate decreased fall risk and improved functional strength.     Time 3   Period Weeks   Status New     PT SHORT TERM GOAL #4   Title Will assess DGI and write appropriate LTG in order to better assess balance.    Baseline 07/16/17: met today with PT to set goal.   Status Achieved     PT SHORT TERM GOAL #5   Title Pt will negotiate up/down 16 stairs with B rails at mod I level in order to indicate safety getting to upstairs bathroom.     Time 3   Period Weeks   Status New             PT Long Term Goals - 07/10/17 2010      PT LONG TERM GOAL #1   Title Pt/daughter will be independent with final HEP in order to indicate improved functional mobility and decreased fall risk.     Time 7   Period Weeks   Status New   Target Date 08/28/17     PT LONG TERM GOAL #2   Title Pt will improve 5TSS to <15 secs without UE support in order to indicate decreased fall risk and improved functional strength.     Time 7   Period Weeks   Status New   Target Date 08/28/17     PT LONG TERM GOAL #3   Title Pt will demonstrate ability to step into/out of tub with light UE support (on wall) at mod I level in order to indicate safe return to home.    Time 7   Period Weeks   Status New   Target Date 08/28/17     PT LONG TERM GOAL #4   Title Pt will improve gait speed to >/=2.62 ft/sec in order to indicate decreased fall risk and improved efficiency of gait.     Time 7   Period Weeks   Status New   Target Date 08/28/17     PT LONG TERM GOAL #5   Title Pt will ambulate up to 500' over unlevel paved surfaces (including ramp/curb) at distant S level (due to cognition) in order to indicate safe return to community.    Time 7   Period Weeks   Status New   Target Date 08/28/17        07/16/17 1540  Plan  Clinical Impression Statement Today's skilled session focused on establishing baseline value for DGI with primary PT to update goals as  needed. Remainder of session addressed initiation of HEP for balance at home. Did limit activity during session due to continued elevated BP readings. Will need to continue to monitor BP with sessions. Pt is progressing toward goals and should benefit from continued PT to progress toward unmet goals.   Pt  will benefit from skilled therapeutic intervention in order to improve on the following deficits Abnormal gait;Decreased activity tolerance;Decreased balance;Decreased cognition;Decreased endurance;Decreased mobility;Decreased strength;Impaired perceived functional ability;Impaired flexibility;Impaired sensation;Improper body mechanics  Rehab Potential Good  Clinical Impairments Affecting Rehab Potential co-morbidities   PT Frequency 2x / week  PT Duration Other (comment) (7 weeks)  PT Treatment/Interventions ADLs/Self Care Home Management;DME Instruction;Gait training;Stair training;Functional mobility training;Therapeutic activities;Therapeutic exercise;Balance training;Neuromuscular re-education;Cognitive remediation;Patient/family education;Orthotic Fit/Training;Energy conservation;Vestibular  PT Next Visit Plan Check BP each visit; add to HEP as needed. continue to work on LE strengthening and balance.   Consulted and Agree with Plan of Care Patient          Patient will benefit from skilled therapeutic intervention in order to improve the following deficits and impairments:  Abnormal gait, Decreased activity tolerance, Decreased balance, Decreased cognition, Decreased endurance, Decreased mobility, Decreased strength, Impaired perceived functional ability, Impaired flexibility, Impaired sensation, Improper body mechanics  Visit Diagnosis: Hemiplegia and hemiparesis following cerebral infarction affecting right dominant side (HCC)  Unsteadiness on feet  Muscle weakness (generalized)  Other abnormalities of gait and mobility     Problem List Patient Active Problem List    Diagnosis Date Noted  . Glucose intolerance (impaired glucose tolerance) 05/01/2017  . Depression 05/01/2017  . Hypertensive crisis   . Acute bilat watershed infarction [facility]) 04/09/2017  . Intracranial vascular stenosis   . Hemiparesis affecting dominant side as late effect of stroke ([place])   . Post-operative pain   . S/P carotid endarterectomy   . Essential hypertension   . Stenosis of left carotid artery   . Hyperlipidemia   . Gait disturbance, post-stroke 04/02/2017  . Right arm weakness   . Slowness and poor responsiveness   . Hypertensive emergency 04/01/2017  . Hypokalemia 04/01/2017  . Hyperglycemia 04/01/2017  . Stroke-like symptoms 04/01/2017  . 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 ([place]) 04/01/2017    Willow Ora, PTA, [place] [address], [place],  [id number] [phone] 07/19/17, 12:32 PM   Name: Willine Schwalbe MRN: [id number] Date of Birth: 1945/11/30

## 2017-07-21 ENCOUNTER — Ambulatory Visit: Payer: BLUE CROSS/BLUE SHIELD | Admitting: Rehabilitation

## 2017-07-22 ENCOUNTER — Ambulatory Visit: Payer: BLUE CROSS/BLUE SHIELD | Admitting: Physical Therapy

## 2017-07-22 VITALS — BP 173/89 | HR 103

## 2017-07-22 DIAGNOSIS — I69351 Hemiplegia and hemiparesis following cerebral infarction affecting right dominant side: Secondary | ICD-10-CM | POA: Diagnosis not present

## 2017-07-22 DIAGNOSIS — R2689 Other abnormalities of gait and mobility: Secondary | ICD-10-CM

## 2017-07-22 DIAGNOSIS — R2681 Unsteadiness on feet: Secondary | ICD-10-CM

## 2017-07-22 DIAGNOSIS — M6281 Muscle weakness (generalized): Secondary | ICD-10-CM

## 2017-07-22 NOTE — Therapy (Signed)
[facility] [address] [place], [place], [address] Phone: [phone]   Fax:  [phone]  Physical Therapy Treatment  Patient Details  Name: [name] [name] MRN: [id number] Date of Birth: 03/26/1946 Referring Provider: Zachary Swartz, MD  Encounter Date: 07/22/2017      PT End of Session - 07/22/17 1533    Visit Number 3   Number of Visits 14   Date for PT Re-Evaluation 08/28/17   Authorization Type BCBS 14 Visits left for PT   Authorization - Visit Number 3   Authorization - Number of Visits 14   PT Start Time 1458  pt arrived late   PT Stop Time 1537   PT Time Calculation (min) 39 min   Activity Tolerance Patient tolerated treatment well   Behavior During Therapy WFL for tasks assessed/performed      Past Medical History:  Diagnosis Date  . Hypertension   . Stroke (HCC)     Past Surgical History:  Procedure Laterality Date  . ENDARTERECTOMY Left 04/08/2017   Procedure: ENDARTERECTOMY CAROTID;  Surgeon: Cain, Brandon Christopher, MD;  Location: [facility];  Service: Vascular;  Laterality: Left;  . PATCH ANGIOPLASTY Left 04/08/2017   Procedure: PATCH ANGIOPLASTY Left Carotid;  Surgeon: Cain, Brandon Christopher, MD;  Location: [facility];  Service: Vascular;  Laterality: Left;    Vitals:   07/22/17 1536  BP: (!) 173/89  Pulse: (!) 103        Subjective Assessment - 07/22/17 1459    Subjective no falls or complaints   Limitations House hold activities;Walking   Patient Stated Goals "To be back to where I was physically."    Currently in Pain? No/denies                         [facility] Adult PT Treatment/Exercise - 07/22/17 1502      Exercises   Exercises Knee/Hip     Knee/Hip Exercises: Aerobic   Nustep L4 x 2:30 min  stopped due to BP     Knee/Hip Exercises: Standing   Heel Raises Both;20 reps   Hip Flexion Both;10 reps;Knee bent   Hip Flexion Limitations 1UE support   Hip  Abduction Both;10 reps;Knee straight   Abduction Limitations cues for technique; UE support   Other Standing Knee Exercises marching forwards/backwards; amb on toes forwards/backwards; and tandem walking forwards/walking with 1 UE support 10'x2 laps each     Knee/Hip Exercises: Seated   Long Arc Quad Both;10 reps   Long Arc Quad Limitations green theraband   Ball Squeeze 20 reps x 5 sec hold   Clamshell with TheraBand Green  x 30 reps   Other Seated Knee/Hip Exercises ankle pumps x 20   Marching Both;10 reps   Marching Limitations green theraband   Sit to Sand 10 reps;without UE support  min cues for weight shifting                PT Education - 07/22/17 1533    Education provided Yes   Education Details reviewed HEP; added green theraband to 2 exercises given from HHPT   Person(s) Educated Patient   Methods Explanation   Comprehension Verbalized understanding          PT Short Term Goals - 07/17/17 1240      PT SHORT TERM GOAL #1   Title Pt/daughter will be independent with initial HEP in order to indicate improved functional mobility and balance.    Time   3   Period Weeks   Status On-going     PT SHORT TERM GOAL #2   Title Pt will improve gait speed to 2.30 ft/sec w/ LRAD in order to indicate decreased fall risk.     Time 3   Period Weeks   Status On-going     PT SHORT TERM GOAL #3   Title Pt will improve 5TSS to <16 secs w/ single UE support in order to indicate decreased fall risk and improved functional strength.     Time 3   Period Weeks   Status New     PT SHORT TERM GOAL #4   Title Will assess DGI and write appropriate LTG in order to better assess balance.    Baseline 07/16/17: met today with PT to set goal.   Status Achieved     PT SHORT TERM GOAL #5   Title Pt will negotiate up/down 16 stairs with B rails at mod I level in order to indicate safety getting to upstairs bathroom.     Time 3   Period Weeks   Status New           PT Long  Term Goals - 07/10/17 2010      PT LONG TERM GOAL #1   Title Pt/daughter will be independent with final HEP in order to indicate improved functional mobility and decreased fall risk.     Time 7   Period Weeks   Status New   Target Date 08/28/17     PT LONG TERM GOAL #2   Title Pt will improve 5TSS to <15 secs without UE support in order to indicate decreased fall risk and improved functional strength.     Time 7   Period Weeks   Status New   Target Date 08/28/17     PT LONG TERM GOAL #3   Title Pt will demonstrate ability to step into/out of tub with light UE support (on wall) at mod I level in order to indicate safe return to home.    Time 7   Period Weeks   Status New   Target Date 08/28/17     PT LONG TERM GOAL #4   Title Pt will improve gait speed to >/=2.62 ft/sec in order to indicate decreased fall risk and improved efficiency of gait.     Time 7   Period Weeks   Status New   Target Date 08/28/17     PT LONG TERM GOAL #5   Title Pt will ambulate up to 500' over unlevel paved surfaces (including ramp/curb) at distant S level (due to cognition) in order to indicate safe return to community.    Time 7   Period Weeks   Status New   Target Date 08/28/17               Plan - 07/22/17 1544    Clinical Impression Statement Today's session focused on review of HEP from HHPT and one given last session.  Pt needed min cues for proper technique with new HEP but overall supervision for other HEP.  Progressed LAQ and seated marching to be with green theraband as well.  Will continue to benefit from PT to maximize function.     PT Treatment/Interventions ADLs/Self Care Home Management;DME Instruction;Gait training;Stair training;Functional mobility training;Therapeutic activities;Therapeutic exercise;Balance training;Neuromuscular re-education;Cognitive remediation;Patient/family education;Orthotic Fit/Training;Energy conservation;Vestibular   PT Next Visit Plan Check BP each  visit; add to HEP as needed. continue to work on LE strengthening and balance.  Consulted and Agree with Plan of Care Patient      Patient will benefit from skilled therapeutic intervention in order to improve the following deficits and impairments:  Abnormal gait, Decreased activity tolerance, Decreased balance, Decreased cognition, Decreased endurance, Decreased mobility, Decreased strength, Impaired perceived functional ability, Impaired flexibility, Impaired sensation, Improper body mechanics  Visit Diagnosis: Hemiplegia and hemiparesis following cerebral infarction affecting right dominant side (HCC)  Unsteadiness on feet  Muscle weakness (generalized)  Other abnormalities of gait and mobility     Problem List Patient Active Problem List   Diagnosis Date Noted  . Glucose intolerance (impaired glucose tolerance) 05/01/2017  . Depression 05/01/2017  . Hypertensive crisis   . Acute bilat watershed infarction (HCC) 04/09/2017  . Intracranial vascular stenosis   . Hemiparesis affecting dominant side as late effect of stroke (HCC)   . Post-operative pain   . S/P carotid endarterectomy   . Essential hypertension   . Stenosis of left carotid artery   . Hyperlipidemia   . Gait disturbance, post-stroke 04/02/2017  . Right arm weakness   . Slowness and poor responsiveness   . Hypertensive emergency 04/01/2017  . Hypokalemia 04/01/2017  . Hyperglycemia 04/01/2017  . Stroke-like symptoms 04/01/2017  . 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 (HCC) 04/01/2017       F , PT, DPT 07/22/17 3:48 PM    [facility] [address] [place], [place], [address] Phone: [phone]   Fax:  [phone]  Name: [name] [name] MRN: [id number] Date of Birth: 02/06/1946   

## 2017-07-23 ENCOUNTER — Telehealth: Payer: Self-pay | Admitting: Neurology

## 2017-07-23 NOTE — Telephone Encounter (Addendum)
Left vm for patients daughter to call back about jury duty letter for her mom.Rn also left vm that PCP needs to know blood pressure numbers. Also Rn left vm that Dr. Erlinda Hong will not be in the office till Monday.Contact number was left if daughter had any questions.
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
## 2017-07-24 ENCOUNTER — Ambulatory Visit: Payer: BLUE CROSS/BLUE SHIELD | Admitting: Physical Therapy

## 2017-07-24 ENCOUNTER — Ambulatory Visit: Payer: BLUE CROSS/BLUE SHIELD | Admitting: Speech Pathology

## 2017-07-24 VITALS — BP 174/72 | HR 97

## 2017-07-24 DIAGNOSIS — R2689 Other abnormalities of gait and mobility: Secondary | ICD-10-CM

## 2017-07-24 DIAGNOSIS — R41841 Cognitive communication deficit: Secondary | ICD-10-CM

## 2017-07-24 DIAGNOSIS — I69351 Hemiplegia and hemiparesis following cerebral infarction affecting right dominant side: Secondary | ICD-10-CM | POA: Diagnosis not present

## 2017-07-24 DIAGNOSIS — R2681 Unsteadiness on feet: Secondary | ICD-10-CM

## 2017-07-24 DIAGNOSIS — M6281 Muscle weakness (generalized): Secondary | ICD-10-CM
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
## 2017-07-24 NOTE — Telephone Encounter (Signed)
Pt daughter is asking for a call back once RN has spoken with Dr Erlinda Hong on Monday  Re: medication and the request for the letter for Jury duty

## 2017-07-24 NOTE — Therapy (Signed)
[facility] [address] [place], [place], [address] Phone: [phone]   Fax:  [phone]  Physical Therapy Treatment  Patient Details  Name: [name] [name] MRN: [id number] Date of Birth: Aug 14, 1946 Referring Provider: Alger Simons, MD  Encounter Date: 07/24/2017      PT End of Session - 07/24/17 1606    Visit Number 4   Number of Visits 14   Date for PT Re-Evaluation 08/28/17   Authorization Type [employer] 14 Visits left for PT   Authorization - Visit Number 4   Authorization - Number of Visits 14   PT Start Time 0175   PT Stop Time 1610   PT Time Calculation (min) 39 min   Equipment Utilized During Treatment Gait belt   Activity Tolerance Patient tolerated treatment well   Behavior During Therapy WFL for tasks assessed/performed      Past Medical History:  Diagnosis Date  . Hypertension   . Stroke [facility])     Past Surgical History:  Procedure Laterality Date  . ENDARTERECTOMY Left 04/08/2017   Procedure: ENDARTERECTOMY CAROTID;  Surgeon: Waynetta Sandy, MD;  Location: [facility];  Service: Vascular;  Laterality: Left;  . PATCH ANGIOPLASTY Left 04/08/2017   Procedure: PATCH ANGIOPLASTY Left Carotid;  Surgeon: Waynetta Sandy, MD;  Location: [facility];  Service: Vascular;  Laterality: Left;    Vitals:   07/24/17 1534 07/24/17 1538 07/24/17 1606  BP: (!) 184/78 (!) 168/80 (!) 174/72  Pulse: 97          Subjective Assessment - 07/24/17 1533    Subjective no changes; doing well   Patient Stated Goals "To be back to where I was physically."    Currently in Pain? No/denies                         [facility] Adult PT Treatment/Exercise - 07/24/17 1544      Knee/Hip Exercises: Aerobic   Nustep L4 x 5 min     Knee/Hip Exercises: Standing   Heel Raises Both;20 reps;3 seconds   Lateral Step Up Both;10 reps;Hand Hold: 2;Step Height: 4"   Forward Step Up Both;10 reps;Hand Hold: 1;Step  Height: 4"   Other Standing Knee Exercises amb forwards/backwards on toes 10'x2 each direction             Balance Exercises - 07/24/17 1605      Balance Exercises: Standing   Standing Eyes Opened Foam/compliant surface;Narrow base of support (BOS);Head turns   Standing Eyes Closed Foam/compliant surface;Wide (BOA);3 reps;20 secs   Other Standing Exercises tap downs from foam alt x 10 bil with 1UE support; unable to perform without UE support             PT Short Term Goals - 07/17/17 1240      PT SHORT TERM GOAL #1   Title Pt/daughter will be independent with initial HEP in order to indicate improved functional mobility and balance.    Time 3   Period Weeks   Status On-going     PT SHORT TERM GOAL #2   Title Pt will improve gait speed to 2.30 ft/sec w/ LRAD in order to indicate decreased fall risk.     Time 3   Period Weeks   Status On-going     PT SHORT TERM GOAL #3   Title Pt will improve 5TSS to <16 secs w/ single UE support in order to indicate decreased fall risk and improved functional  strength.     Time 3   Period Weeks   Status New     PT SHORT TERM GOAL #4   Title Will assess DGI and write appropriate LTG in order to better assess balance.    Baseline 07/16/17: met today with PT to set goal.   Status Achieved     PT SHORT TERM GOAL #5   Title Pt will negotiate up/down 16 stairs with B rails at mod I level in order to indicate safety getting to upstairs bathroom.     Time 3   Period Weeks   Status New           PT Long Term Goals - 07/10/17 2010      PT LONG TERM GOAL #1   Title Pt/daughter will be independent with final HEP in order to indicate improved functional mobility and decreased fall risk.     Time 7   Period Weeks   Status New   Target Date 08/28/17     PT LONG TERM GOAL #2   Title Pt will improve 5TSS to <15 secs without UE support in order to indicate decreased fall risk and improved functional strength.     Time 7   Period  Weeks   Status New   Target Date 08/28/17     PT LONG TERM GOAL #3   Title Pt will demonstrate ability to step into/out of tub with light UE support (on wall) at mod I level in order to indicate safe return to home.    Time 7   Period Weeks   Status New   Target Date 08/28/17     PT LONG TERM GOAL #4   Title Pt will improve gait speed to >/=2.62 ft/sec in order to indicate decreased fall risk and improved efficiency of gait.     Time 7   Period Weeks   Status New   Target Date 08/28/17     PT LONG TERM GOAL #5   Title Pt will ambulate up to 500' over unlevel paved surfaces (including ramp/curb) at distant S level (due to cognition) in order to indicate safe return to community.    Time 7   Period Weeks   Status New   Target Date 08/28/17               Plan - 07/24/17 1607    Clinical Impression Statement Systolic BP elevated today but below threshold for PT and monitored today during session.  Pt tolerated increased activity well and will continue to benefit from PT to maximize independence.   PT Treatment/Interventions ADLs/Self Care Home Management;DME Instruction;Gait training;Stair training;Functional mobility training;Therapeutic activities;Therapeutic exercise;Balance training;Neuromuscular re-education;Cognitive remediation;Patient/family education;Orthotic Fit/Training;Energy conservation;Vestibular   PT Next Visit Plan Check BP each visit; add to HEP as needed. continue to work on LE strengthening and balance. compliant surface activities   Consulted and Agree with Plan of Care Patient      Patient will benefit from skilled therapeutic intervention in order to improve the following deficits and impairments:  Abnormal gait, Decreased activity tolerance, Decreased balance, Decreased cognition, Decreased endurance, Decreased mobility, Decreased strength, Impaired perceived functional ability, Impaired flexibility, Impaired sensation, Improper body mechanics  Visit  Diagnosis: Hemiplegia and hemiparesis following cerebral infarction affecting right dominant side (HCC)  Unsteadiness on feet  Muscle weakness (generalized)  Other abnormalities of gait and mobility     Problem List Patient Active Problem List   Diagnosis Date Noted  . Glucose intolerance (impaired glucose tolerance)  05/01/2017  . Depression 05/01/2017  . Hypertensive crisis   . Acute bilat watershed infarction [facility]) 04/09/2017  . Intracranial vascular stenosis   . Hemiparesis affecting dominant side as late effect of stroke ([place])   . Post-operative pain   . S/P carotid endarterectomy   . Essential hypertension   . Stenosis of left carotid artery   . Hyperlipidemia   . Gait disturbance, post-stroke 04/02/2017  . Right arm weakness   . Slowness and poor responsiveness   . Hypertensive emergency 04/01/2017  . Hypokalemia 04/01/2017  . Hyperglycemia 04/01/2017  . Stroke-like symptoms 04/01/2017  . 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 ([place]) 04/01/2017      Laureen Abrahams, PT, DPT 07/24/17 4:08 PM    [facility] [address] [place], [place], [address] Phone: [phone]   Fax:  [phone]  Name: [name] [name] MRN: [id number] Date of Birth: 1946-01-18

## 2017-07-24 NOTE — Therapy (Signed)
[facility] [address] [place], [place], [address] Phone: [phone]   Fax:  [phone]  Speech Language Pathology Treatment  Patient Details  Name: [name] [name] MRN: [id number] Date of Birth: 22-Jan-1946 Referring Provider: Dr. Alger Simons  Encounter Date: 07/24/2017      End of Session - 07/24/17 1939    Visit Number 3   Number of Visits 17   Date for SLP Re-Evaluation 09/05/17   Authorization Type [employer] medicare    Authorization - Visit Number 3   Authorization - Number of Visits 24   SLP Start Time 7169   SLP Stop Time  1530   SLP Time Calculation (min) 40 min   Activity Tolerance Patient tolerated treatment well      Past Medical History:  Diagnosis Date   Hypertension    Stroke [facility])     Past Surgical History:  Procedure Laterality Date   ENDARTERECTOMY Left 04/08/2017   Procedure: ENDARTERECTOMY CAROTID;  Surgeon: Waynetta Sandy, MD;  Location: [facility];  Service: Vascular;  Laterality: Left;   PATCH ANGIOPLASTY Left 04/08/2017   Procedure: PATCH ANGIOPLASTY Left Carotid;  Surgeon: Waynetta Sandy, MD;  Location: [facility];  Service: Vascular;  Laterality: Left;    There were no vitals filed for this visit.      Subjective Assessment - 07/24/17 1456    Subjective "I think it's going pretty good. I think I'll get there."   Currently in Pain? No/denies               ADULT SLP TREATMENT - 07/24/17 1456      General Information   Behavior/Cognition Alert;Cooperative;Pleasant mood   Patient Positioning Upright in chair   Oral care provided N/A     Treatment Provided   Treatment provided Cognitive-Linquistic     Pain Assessment   Pain Assessment No/denies pain     Cognitive-Linquistic Treatment   Treatment focused on Cognition   Skilled Treatment Simple paragraph reading/ retention with initial 60% accuracy; pt requires verbal cues to search for errors,  corrects with extended time and min A. Teachback for attention to detail; in subsequent task pt improved retention to 90% accuracy for simple paragraph level information. Following written directions with extended time, occasional min A, 80% accuracy with increased errors during intentional distractions. Mod A for error awareness.     Assessment / Recommendations / Plan   Plan Continue with current plan of care     Progression Toward Goals   Progression toward goals Progressing toward goals            SLP Short Term Goals - 07/24/17 1940      SLP SHORT TERM GOAL #1   Title Pt will selectively attend to simple cogntive linguistic problem solving task in mildly distracting environment with 90% accuracy and occasional min A   Time 3   Period Weeks   Status On-going     SLP SHORT TERM GOAL #2   Title Pt will utilize external aids to manage schedule, medications, and be oriented with occasional min A over 3 sessions   Time 3   Period Weeks   Status On-going     SLP SHORT TERM GOAL #3   Title Pt will solve simple functional time/money problems with 85% accuracy and occasional min A   Time 3   Period Weeks   Status On-going     SLP SHORT TERM GOAL #4   Title Pt will follow  3 step detailed instructions/questions on cognitive linguistic tasks with less than 3 requests for repeat out of 5 trials.    Time 3   Period Weeks   Status On-going          SLP Long Term Goals - 07/24/17 1941      SLP LONG TERM GOAL #1   Title Pt will utlize external aids to manage schedule, meds, and finances with occasional min A over 2 sessionsl    Time 7   Period Weeks   Status On-going     SLP LONG TERM GOAL #2   Title Pt will alternate attention between 2 mildly complex cognitive linguistic tasks with 85% on each and occasional min A   Time 7   Period Weeks   Status On-going     SLP LONG TERM GOAL #3   Title Pt will solve mildly complex reasoning, organization, time/money problems with  85% accuracy and occasional min A.   Time 7   Period Weeks   Status On-going          Plan - 07/24/17 2002    Clinical Impression Statement Pt demonstrates ongoing cognitive linguistic impairments in attention/attention to detail, and error awareness, putting her at high risk for decr'd safety awareness. Pt required min-mod A to check for mistakes in simple cognitive linguistic tasks. Pt requested repeats of questions for simple paragraph retention, however she also demod reduced attention to detail during this task; suspect both auditory processing and attention to detail impaired. Skilled ST is recommend to continue to maximize cognitive for pt to eventually return to living independently.   Speech Therapy Frequency 2x / week   Treatment/Interventions Compensatory strategies;Functional tasks;Patient/family education;Cognitive reorganization;Environmental controls;Internal/external aids;SLP instruction and feedback;Language facilitation   Potential to Achieve Goals Good   SLP Home Exercise Plan reviewed/provided   Consulted and Agree with Plan of Care Patient      Patient will benefit from skilled therapeutic intervention in order to improve the following deficits and impairments:   Cognitive communication deficit    Problem List Patient Active Problem List   Diagnosis Date Noted   Glucose intolerance (impaired glucose tolerance) 05/01/2017   Depression 05/01/2017   Hypertensive crisis    Acute bilat watershed infarction [facility]) 04/09/2017   Intracranial vascular stenosis    Hemiparesis affecting dominant side as late effect of stroke (HCC)    Post-operative pain    S/P carotid endarterectomy    Essential hypertension    Stenosis of left carotid artery    Hyperlipidemia    Gait disturbance, post-stroke 04/02/2017   Right arm weakness    Slowness and poor responsiveness    Hypertensive emergency 04/01/2017   Hypokalemia 04/01/2017   Hyperglycemia  04/01/2017   Stroke-like symptoms 04/01/2017   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 ([place]) 04/01/2017   Deneise Lever, [place], [place] Speech-Language Pathologist  Aliene Altes 07/24/2017, 8:03 PM  [facility] [address] [place], [place], [address] Phone: [phone]   Fax:  [phone]   Name: [name] [name] MRN: [id number] Date of Birth: 1946/03/30

## 2017-07-28 ENCOUNTER — Ambulatory Visit: Payer: BLUE CROSS/BLUE SHIELD | Admitting: Speech Pathology

## 2017-07-28 ENCOUNTER — Ambulatory Visit: Payer: BLUE CROSS/BLUE SHIELD | Admitting: Occupational Therapy

## 2017-07-28 ENCOUNTER — Encounter: Payer: Self-pay | Admitting: Neurology

## 2017-07-28 DIAGNOSIS — I69351 Hemiplegia and hemiparesis following cerebral infarction affecting right dominant side: Secondary | ICD-10-CM | POA: Diagnosis not present

## 2017-07-28 DIAGNOSIS — R278 Other lack of coordination: Secondary | ICD-10-CM

## 2017-07-28 DIAGNOSIS — R41841 Cognitive communication deficit: Secondary | ICD-10-CM

## 2017-07-28 DIAGNOSIS — M6281 Muscle weakness (generalized): Secondary | ICD-10-CM

## 2017-07-28 NOTE — Therapy (Signed)
[facility] [address] [place], [place], [address] Phone: [phone]   Fax:  [phone]  Speech Language Pathology Treatment  Patient Details  Name: [name] [name] MRN: [id number] Date of Birth: [birth year] Referring Provider: Dr. Alger Simons  Encounter Date: 07/28/2017      End of Session - 07/28/17 1750    Visit Number 4   Number of Visits 17   Date for SLP Re-Evaluation 09/05/17   Authorization Type [employer] medicare    Authorization - Visit Number 4   Authorization - Number of Visits 24   SLP Start Time 9892   SLP Stop Time  1530   SLP Time Calculation (min) 40 min   Activity Tolerance Patient tolerated treatment well      Past Medical History:  Diagnosis Date  . Hypertension   . Stroke [facility])     Past Surgical History:  Procedure Laterality Date  . ENDARTERECTOMY Left 04/08/2017   Procedure: ENDARTERECTOMY CAROTID;  Surgeon: Waynetta Sandy, MD;  Location: [facility];  Service: Vascular;  Laterality: Left;  . PATCH ANGIOPLASTY Left 04/08/2017   Procedure: PATCH ANGIOPLASTY Left Carotid;  Surgeon: Waynetta Sandy, MD;  Location: [facility];  Service: Vascular;  Laterality: Left;    There were no vitals filed for this visit.      Subjective Assessment - 07/28/17 1450    Subjective "I think I did OK," SLP has to request previous session's HW from pt               ADULT SLP TREATMENT - 07/28/17 1450      General Information   Behavior/Cognition Alert;Cooperative;Pleasant mood   Patient Positioning Upright in chair     Treatment Provided   Treatment provided Cognitive-Linquistic     Pain Assessment   Pain Assessment No/denies pain     Cognitive-Linquistic Treatment   Treatment focused on Cognition   Skilled Treatment Facilitated error awareness with self-correction of homework with min A for attention to detail, and to complete skipped items (1-2 per page) 80% accuracy. In  simple time/functional recipe problems, pt required extended time, initial mod-max A with fading support to occasional min A as pt gained familiarity with task.      Assessment / Recommendations / Plan   Plan Continue with current plan of care     Progression Toward Goals   Progression toward goals Progressing toward goals          SLP Education - 07/28/17 1749    Education provided Yes   Education Details Attention to detail; review information systematically   Person(s) Educated Patient   Methods Explanation;Demonstration;Verbal cues   Comprehension Verbalized understanding;Returned demonstration;Verbal cues required          SLP Short Term Goals - 07/28/17 1746      SLP SHORT TERM GOAL #1   Title Pt will selectively attend to simple cogntive linguistic problem solving task in mildly distracting environment with 90% accuracy and occasional min A   Time 2   Period Weeks   Status On-going     SLP SHORT TERM GOAL #2   Title Pt will utilize external aids to manage schedule, medications, and be oriented with occasional min A over 3 sessions   Time 2   Period Weeks   Status On-going     SLP SHORT TERM GOAL #3   Title Pt will solve simple functional time/money problems with 85% accuracy and occasional min A   Time 2  Period Weeks   Status On-going     SLP SHORT TERM GOAL #4   Title Pt will follow  3 step detailed instructions/questions on cognitive linguistic tasks with less than 3 requests for repeat out of 5 trials.    Time 2   Period Weeks   Status On-going          SLP Long Term Goals - 07/28/17 1753      SLP LONG TERM GOAL #1   Title Pt will utlize external aids to manage schedule, meds, and finances with occasional min A over 2 sessionsl    Time 6   Period Weeks   Status On-going     SLP LONG TERM GOAL #2   Title Pt will alternate attention between 2 mildly complex cognitive linguistic tasks with 85% on each and occasional min A   Time 6   Period  Weeks   Status On-going     SLP LONG TERM GOAL #3   Title Pt will solve mildly complex reasoning, organization, time/money problems with 85% accuracy and occasional min A.   Time 6   Period Weeks   Status On-going          Plan - 07/28/17 1751    Clinical Impression Statement Pt demonstrates ongoing cognitive linguistic impairments in attention/attention to detail, and error awareness, putting her at high risk for decr'd safety awareness. Pt required min-mod A to check for mistakes in simple cognitive linguistic tasks. Pt requires extended processing time in simple cognitive linguistic tasks today. Skilled ST is recommend to continue to maximize cognitive for pt to eventually return to living independently.   Speech Therapy Frequency 2x / week   Treatment/Interventions Compensatory strategies;Functional tasks;Patient/family education;Cognitive reorganization;Environmental controls;Internal/external aids;SLP instruction and feedback;Language facilitation   Potential to Achieve Goals Good   SLP Home Exercise Plan reviewed/provided   Consulted and Agree with Plan of Care Patient      Patient will benefit from skilled therapeutic intervention in order to improve the following deficits and impairments:   Cognitive communication deficit    Problem List Patient Active Problem List   Diagnosis Date Noted  . Glucose intolerance (impaired glucose tolerance) 05/01/2017  . Depression 05/01/2017  . Hypertensive crisis   . Acute bilat watershed infarction [facility]) 04/09/2017  . Intracranial vascular stenosis   . Hemiparesis affecting dominant side as late effect of stroke ([place])   . Post-operative pain   . S/P carotid endarterectomy   . Essential hypertension   . Stenosis of left carotid artery   . Hyperlipidemia   . Gait disturbance, post-stroke 04/02/2017  . Right arm weakness   . Slowness and poor responsiveness   . Hypertensive emergency 04/01/2017  . Hypokalemia 04/01/2017  .  Hyperglycemia 04/01/2017  . Stroke-like symptoms 04/01/2017  . 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 ([place]) 04/01/2017   Deneise Lever, [place], [place] 07/28/2017, 5:53 PM  [facility] [address] [place], [place], [address] Phone: [phone]   Fax:  [phone]   Name: Shaleigh Laubscher MRN: [id number] Date of Birth: 02/13/1946

## 2017-07-28 NOTE — Telephone Encounter (Signed)
Letter at front desk for patient to pick up. 

## 2017-07-28 NOTE — Telephone Encounter (Signed)
Discussed with daughter [name] and she stated that pt still lack of energy and needed transportation for outside activity. Her memory is not good as before and she still has right hand weakness. I agree that she may be excused from jury duty this time but can be a candidate in the future depending on her recovery. Daughter agreed. I also asked daughter to contact PCP for BP management. She agreed to call pt PCP to discuss.   Hi, Katrina, I have prepared the letter for jury duty excusal and put on your desk. Please call pt daughter to pick up. Thanks.   Rosalin Hawking, MD PhD Stroke Neurology 07/28/2017 1:43 PM

## 2017-07-28 NOTE — Patient Instructions (Signed)
  Coordination Activities  Perform the following activities for 20 minutes 1 times per day with right hand(s).   Rotate ball in fingertips (clockwise and counter-clockwise).  Toss ball between hands.  Flip cards 1 at a time as fast as you can.  Deal cards with your thumb (Hold deck in hand and push card off top with thumb).  Pick up coins and place in container or coin bank.  Pick up coins and stack.  Pick up coins one at a time until you get 5-10 in your hand, then move coins from palm to fingertips to stack one at a time.  Practice writing and/or typing.   1. Grip Strengthening (Resistive Putty)   Squeeze putty using thumb and all fingers. Repeat _20___ times. Do __2__ sessions per day.   2. Roll putty into tube on table and pinch between each finger and thumb x 10 reps each. (can do ring and Stetler finger together)     Copyright  VHI. All rights reserved.

## 2017-07-28 NOTE — Therapy (Signed)
[facility] [address] [place], [place], [address] Phone: [phone]   Fax:  [phone]  Occupational Therapy Treatment  Patient Details  Name: [name] [name] MRN: [id number] Date of Birth: Nov 20, 1945 Referring Provider: Dr. Donetta Potts  Encounter Date: 07/28/2017      OT End of Session - 07/28/17 1429    Visit Number 2   Number of Visits 14   Date for OT Re-Evaluation 08/28/17   Authorization Type BC/BS MCR - G code required   Authorization Time Period At evaluation - 14 visits remaining for OT    Authorization - Visit Number 2   Authorization - Number of Visits 10   OT Start Time 4132   OT Stop Time 1445   OT Time Calculation (min) 42 min   Activity Tolerance Patient tolerated treatment well   Behavior During Therapy [facility] for tasks assessed/performed      Past Medical History:  Diagnosis Date  . Hypertension   . Stroke [facility])     Past Surgical History:  Procedure Laterality Date  . ENDARTERECTOMY Left 04/08/2017   Procedure: ENDARTERECTOMY CAROTID;  Surgeon: Waynetta Sandy, MD;  Location: [place];  Service: Vascular;  Laterality: Left;  . PATCH ANGIOPLASTY Left 04/08/2017   Procedure: PATCH ANGIOPLASTY Left Carotid;  Surgeon: Waynetta Sandy, MD;  Location: [place];  Service: Vascular;  Laterality: Left;    There were no vitals filed for this visit.      Subjective Assessment - 07/28/17 1406    Pertinent History bilateral watershed infarction (Lt side worse than Rt) 04/01/17, Lt carotid endarterectomy 04/08/17, PMH: chronic T[place]ros vessel dz, h/o smaller strokes   Patient Stated Goals return to cooking and get my Rt hand better   Currently in Pain? No/denies              Treatment: Handwriting activities with digital finger control worksheet, min-mod difficulty. Followed by practice writing, pt prefers a larger sized regular pen. Emphasizing larger  printing.                OT Education - 07/28/17 1437    Education provided Yes   Education Details yellow putty exercises- pt has putty from hospital, coordination HEP, see pt instructions   Person(s) Educated Patient   Methods Explanation;Demonstration;Verbal cues;Handout   Comprehension Verbalized understanding;Returned demonstration;Verbal cues required          OT Short Term Goals - 07/10/17 1703      OT SHORT TERM GOAL #1   Title Independent with coordination and putty HEP    Time 3   Period Weeks   Status New   Target Date 07/31/17     OT SHORT TERM GOAL #2   Title Pt to tie shoes or able to wear tie up shoes with A/E prn   Time 3   Period Weeks   Status New     OT SHORT TERM GOAL #3   Title pt to safely perform tub/shower transfer with DME prn   Time 3   Period Weeks   Status New     OT SHORT TERM GOAL #4   Title Pt to perform simple cold meal, microwaveable meals, and snack prep mod I level consistently    Time 3   Period Weeks   Status New     OT SHORT TERM GOAL #5   Title Pt to consistently participate in light household tasks (folding clothes, washing dishes)   Time 3   Period  Weeks   Status New           OT Long Term Goals - 07/10/17 1707      OT LONG TERM GOAL #1   Title Independent with updated HEP    Time 7   Period Weeks   Status New   Target Date 08/28/17     OT LONG TERM GOAL #2   Title Pt to improve coordination Rt hand as evidenced by reducing speed on 9 hole peg test to 38 sec. or under   Baseline eval: 50 sec   Time 7   Period Weeks   Status New     OT LONG TERM GOAL #3   Title Pt to improve grip strength Rt dominant hand to 25 lbs or greater to assist with opening jars/containers   Baseline eval: 10 lbs (Lt = 58 lbs)    Time 7   Period Weeks   Status New     OT LONG TERM GOAL #4   Title Pt to retrieve/replace 2 lb. object from high level shelf RUE with no reports of pain   Time 7   Period Weeks   Status  New     OT LONG TERM GOAL #5   Title Pt to make simple meal mod I level safely   Time 7   Period Weeks   Status New     Long Term Additional Goals   Additional Long Term Goals Yes     OT LONG TERM GOAL #6   Title Pt/family to verbalize understanding with necessary DME, modifications, and external aids/devices to put in place if pt were to return to living alone   Time 7   Period Weeks   Status New               Plan - 07/28/17 1430    Clinical Impression Statement Pt is progressing towards goals. She demonstrates understanding of HEP.   Rehab Potential Good   Current Impairments/barriers affecting progress: chronic [place]lund vessel disease making her more prone to future strokes   OT Frequency 2x / week   OT Duration --  7 weeks    OT Treatment/Interventions Self-care/ADL training;Moist Heat;DME and/or AE instruction;Splinting;Patient/family education;Therapeutic exercises;Therapeutic activities;Passive range of motion;Neuromuscular education;Functional Mobility Training;Manual Therapy;Visual/perceptual remediation/compensation;Cognitive remediation/compensation   Plan continue to address RUE functional use and fine motor coordination   Consulted and Agree with Plan of Care Patient      Patient will benefit from skilled therapeutic intervention in order to improve the following deficits and impairments:  Decreased coordination, Decreased range of motion, Impaired flexibility, Decreased safety awareness, Decreased endurance, Impaired sensation, Decreased knowledge of precautions, Impaired UE functional use, Pain, Decreased mobility, Decreased strength, Decreased cognition, Impaired vision/preception  Visit Diagnosis: Muscle weakness (generalized)  Hemiplegia and hemiparesis following cerebral infarction affecting right dominant side (HCC)  Other lack of coordination    Problem List Patient Active Problem List   Diagnosis Date Noted  . Glucose intolerance (impaired  glucose tolerance) 05/01/2017  . Depression 05/01/2017  . Hypertensive crisis   . Acute bilat watershed infarction [facility]) 04/09/2017  . Intracranial vascular stenosis   . Hemiparesis affecting dominant side as late effect of stroke ([place])   . Post-operative pain   . S/P carotid endarterectomy   . Essential hypertension   . Stenosis of left carotid artery   . Hyperlipidemia   . Gait disturbance, post-stroke 04/02/2017  . Right arm weakness   . Slowness and poor responsiveness   . Hypertensive  emergency 04/01/2017  . Hypokalemia 04/01/2017  . Hyperglycemia 04/01/2017  . Stroke-like symptoms 04/01/2017  . 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 ([place]) 04/01/2017    Vani Gunner 07/28/2017, 2:38 PM  [facility] [address] [place], [place], [address] Phone: [phone]   Fax:  [phone]  Name: Chade Pitner MRN: [id number] Date of Birth: 1946-03-21

## 2017-08-01 ENCOUNTER — Encounter: Payer: Self-pay | Admitting: Physical Therapy

## 2017-08-01 ENCOUNTER — Ambulatory Visit: Payer: BLUE CROSS/BLUE SHIELD | Admitting: Physical Therapy

## 2017-08-01 VITALS — BP 180/79

## 2017-08-01 DIAGNOSIS — I69351 Hemiplegia and hemiparesis following cerebral infarction affecting right dominant side: Secondary | ICD-10-CM

## 2017-08-01 DIAGNOSIS — R2689 Other abnormalities of gait and mobility: Secondary | ICD-10-CM

## 2017-08-01 DIAGNOSIS — M6281 Muscle weakness (generalized): Secondary | ICD-10-CM

## 2017-08-01 DIAGNOSIS — R2681 Unsteadiness on feet: Secondary | ICD-10-CM

## 2017-08-01 NOTE — Patient Instructions (Addendum)
Perform these in a corner with a chair in front of you for safety: Feet Together (Compliant Surface) Varied Arm Positions - Eyes Closed    Stand on compliant surface: pillow/s with feet together and arms at sides. Close eyes and visualize upright position. Hold_30_ seconds. Repeat _3_ times per session. Do _1-2_ sessions per day.  Copyright  VHI. All rights reserved.    Feet Together (Compliant Surface) Head Motion - Eyes Closed    Stand on compliant surface: pillow/s with feet together. Close eyes and move head slowly: 1. Up and down x 10 reps 2. Left and right x 10 reps  Do _1-2_ sessions per day.  Copyright  VHI. All rights reserved.

## 2017-08-03 NOTE — Therapy (Signed)
[facility] [address] [place], [place], [address] Phone: [phone]   Fax:  [phone]  Physical Therapy Treatment  Patient Details  Name: [name] [name] MRN: [id number] Date of Birth: [birth year] Referring Provider: Alger Simons, MD  Encounter Date: 08/01/2017   08/01/17 1409  PT Visits / Re-Eval  Visit Number 5  Number of Visits 14  Date for PT Re-Evaluation 08/28/17  Authorization  Authorization Type BCBS 14 Visits left for PT  Authorization - Visit Number 5  Authorization - Number of Visits 14  PT Time Calculation  PT Start Time 9211  PT Stop Time 1445  PT Time Calculation (min) 40 min  PT - End of Session  Equipment Utilized During Treatment Gait belt  Activity Tolerance Patient tolerated treatment well  Behavior During Therapy [facility] for tasks assessed/performed     Past Medical History:  Diagnosis Date  . Hypertension   . Stroke [facility])     Past Surgical History:  Procedure Laterality Date  . ENDARTERECTOMY Left 04/08/2017   Procedure: ENDARTERECTOMY CAROTID;  Surgeon: Waynetta Sandy, MD;  Location: [place];  Service: Vascular;  Laterality: Left;  . PATCH ANGIOPLASTY Left 04/08/2017   Procedure: PATCH ANGIOPLASTY Left Carotid;  Surgeon: Waynetta Sandy, MD;  Location: [place];  Service: Vascular;  Laterality: Left;    Vitals:   08/01/17 1409  BP: (!) 180/79     08/01/17 1408  Symptoms/Limitations  Subjective No new complaints. No falls or pain to report.   Limitations House hold activities;Walking  Patient Stated Goals "To be back to where I was physically."   Pain Assessment  Currently in Pain? No/denies    08/01/17 1413  Transfers  Transfers Sit to Stand;Stand to Sit  Sit to Stand 5: Supervision  Five time sit to stand comments  15.53 sec's with single UE support on standard chair arm   Stand to Sit 5: Supervision  Ambulation/Gait  Ambulation/Gait Yes    Ambulation/Gait Assistance 5: Supervision  Ambulation/Gait Assistance Details cues for increased step length and arm swing with gait  Ambulation Distance (Feet) 220 Feet  Assistive device None  Gait Pattern Step-through pattern;Decreased stride length;Decreased arm swing - right;Decreased arm swing - left;Shuffle;Trunk flexed;Poor foot clearance - right  Ambulation Surface Level;Indoor  Gait velocity 16.04 sec's= 2.04 ft/sec no AD  Stairs Yes  Stairs Assistance 6: Modified independent (Device/Increase time)  Stair Management Technique Two rails;Step to pattern;Forwards  Number of Stairs 4 (x4 reps)  Height of Stairs 6   Pt performed all current HEP exercises with handout without cues needed. Added the following today with min cues needed on correct performance: Perform these in a corner with a chair in front of you for safety: Feet Together (Compliant Surface) Varied Arm Positions - Eyes Closed    Stand on compliant surface: pillow/s with feet together and arms at sides. Close eyes and visualize upright position. Hold_30_ seconds. Repeat _3_ times per session. Do _1-2_ sessions per day.  Copyright  VHI. All rights reserved.    Feet Together (Compliant Surface) Head Motion - Eyes Closed    Stand on compliant surface: pillow/s with feet together. Close eyes and move head slowly: 1. Up and down x 10 reps 2. Left and right x 10 reps  Do _1-2_ sessions per day.  Copyright  VHI. All rights reserved.        08/01/17 1444  PT Education  Education provided Yes  Education Details progress toward goals and added corner  balance to HEP  Person(s) Educated Patient  Methods Explanation;Demonstration;Verbal cues;Handout  Comprehension Verbalized understanding;Returned demonstration;Verbal cues required;Need further instruction         PT Short Term Goals - 08/01/17 1412      PT SHORT TERM GOAL #1   Title Pt/daughter will be independent with initial HEP in order to indicate  improved functional mobility and balance.    Baseline 08/01/17: pt able to perform all current HEP with handouts, no cues needed.    Time --   Period --   Status Achieved     PT SHORT TERM GOAL #2   Title Pt will improve gait speed to 2.30 ft/sec w/ LRAD in order to indicate decreased fall risk.     Baseline 08/01/17:    Time --   Period --   Status Partially Met     PT SHORT TERM GOAL #3   Title Pt will improve 5TSS to <16 secs w/ single UE support in order to indicate decreased fall risk and improved functional strength.     Baseline 08/01/17:   Time --   Period --   Status Achieved     PT SHORT TERM GOAL #4   Title Will assess DGI and write appropriate LTG in order to better assess balance.    Baseline 07/16/17: met today with PT to set goal.   Status Achieved     PT SHORT TERM GOAL #5   Title Pt will negotiate up/down 16 stairs with B rails at mod I level in order to indicate safety getting to upstairs bathroom.     Baseline 08/01/17: met today   Time --   Period --   Status Achieved           PT Long Term Goals - 07/10/17 2010      PT LONG TERM GOAL #1   Title Pt/daughter will be independent with final HEP in order to indicate improved functional mobility and decreased fall risk.     Time 7   Period Weeks   Status New   Target Date 08/28/17     PT LONG TERM GOAL #2   Title Pt will improve 5TSS to <15 secs without UE support in order to indicate decreased fall risk and improved functional strength.     Time 7   Period Weeks   Status New   Target Date 08/28/17     PT LONG TERM GOAL #3   Title Pt will demonstrate ability to step into/out of tub with light UE support (on wall) at mod I level in order to indicate safe return to home.    Time 7   Period Weeks   Status New   Target Date 08/28/17     PT LONG TERM GOAL #4   Title Pt will improve gait speed to >/=2.62 ft/sec in order to indicate decreased fall risk and improved efficiency of gait.     Time 7    Period Weeks   Status New   Target Date 08/28/17     PT LONG TERM GOAL #5   Title Pt will ambulate up to 500' over unlevel paved surfaces (including ramp/curb) at distant S level (due to cognition) in order to indicate safe return to community.    Time 7   Period Weeks   Status New   Target Date 08/28/17        08/01/17 1409  Plan  Clinical Impression Statement Today's skilled session addressed progress toward goals with 4/5  STGs met, and remaining goal partially met. Also added corner balance to HEP today. Pt should benefit from continued PT to progress toward unmet goals.   Pt will benefit from skilled therapeutic intervention in order to improve on the following deficits Abnormal gait;Decreased activity tolerance;Decreased balance;Decreased cognition;Decreased endurance;Decreased mobility;Decreased strength;Impaired perceived functional ability;Impaired flexibility;Impaired sensation;Improper body mechanics  PT Treatment/Interventions ADLs/Self Care Home Management;DME Instruction;Gait training;Stair training;Functional mobility training;Therapeutic activities;Therapeutic exercise;Balance training;Neuromuscular re-education;Cognitive remediation;Patient/family education;Orthotic Fit/Training;Energy conservation;Vestibular  PT Next Visit Plan Check BP each visit; continue to work on LE strengthening and balance. compliant surface activities  Consulted and Agree with Plan of Care Patient    Patient will benefit from skilled therapeutic intervention in order to improve the following deficits and impairments:  Abnormal gait, Decreased activity tolerance, Decreased balance, Decreased cognition, Decreased endurance, Decreased mobility, Decreased strength, Impaired perceived functional ability, Impaired flexibility, Impaired sensation, Improper body mechanics  Visit Diagnosis: Muscle weakness (generalized)  Hemiplegia and hemiparesis following cerebral infarction affecting right dominant side  (HCC)  Unsteadiness on feet  Other abnormalities of gait and mobility     Problem List Patient Active Problem List   Diagnosis Date Noted  . Glucose intolerance (impaired glucose tolerance) 05/01/2017  . Depression 05/01/2017  . Hypertensive crisis   . Acute bilat watershed infarction [facility]) 04/09/2017  . Intracranial vascular stenosis   . Hemiparesis affecting dominant side as late effect of stroke ([place])   . Post-operative pain   . S/P carotid endarterectomy   . Essential hypertension   . Stenosis of left carotid artery   . Hyperlipidemia   . Gait disturbance, post-stroke 04/02/2017  . Right arm weakness   . Slowness and poor responsiveness   . Hypertensive emergency 04/01/2017  . Hypokalemia 04/01/2017  . Hyperglycemia 04/01/2017  . Stroke-like symptoms 04/01/2017  . 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 ([place]) 04/01/2017    Willow Ora, PTA, [facility] [address] [place], [place] [address] [phone] 08/03/17, 2:15 PM   Name: [name] [name] MRN: [id number] Date of Birth: 12-18-1945

## 2017-08-04 ENCOUNTER — Encounter: Payer: Self-pay | Admitting: Physical Medicine & Rehabilitation

## 2017-08-04 ENCOUNTER — Encounter
Payer: BLUE CROSS/BLUE SHIELD | Attending: Physical Medicine & Rehabilitation | Admitting: Physical Medicine & Rehabilitation

## 2017-08-04 ENCOUNTER — Telehealth: Payer: Self-pay | Admitting: Family Medicine

## 2017-08-04 VITALS — BP 184/79 | HR 102

## 2017-08-04 DIAGNOSIS — E785 Hyperlipidemia, unspecified: Secondary | ICD-10-CM | POA: Insufficient documentation

## 2017-08-04 DIAGNOSIS — I69359 Hemiplegia and hemiparesis following cerebral infarction affecting unspecified side: Secondary | ICD-10-CM | POA: Diagnosis not present

## 2017-08-04 DIAGNOSIS — Z8673 Personal history of transient ischemic attack (TIA), and cerebral infarction without residual deficits: Secondary | ICD-10-CM | POA: Diagnosis not present

## 2017-08-04 DIAGNOSIS — I10 Essential (primary) hypertension: Secondary | ICD-10-CM | POA: Insufficient documentation

## 2017-08-04 DIAGNOSIS — F419 Anxiety disorder, unspecified: Secondary | ICD-10-CM | POA: Insufficient documentation

## 2017-08-04 DIAGNOSIS — G8191 Hemiplegia, unspecified affecting right dominant side: Secondary | ICD-10-CM | POA: Insufficient documentation

## 2017-08-04 DIAGNOSIS — I638 Other cerebral infarction: Secondary | ICD-10-CM | POA: Insufficient documentation

## 2017-08-04 MED ORDER — LISINOPRIL 20 MG PO TABS: 20.0000 mg | ORAL_TABLET | Freq: Every day | ORAL | 4 refills | Status: DC

## 2017-08-04 NOTE — Telephone Encounter (Signed)
Called and spoke to the daughter and she states that the patient saw Dr. Erlinda Hong on 07/10/2017 and blood pressure in the office was around 180/90, patient was advised to log home readings and report those to PCP.  Daughter states that home readings since that visit has been: 147/76, 152/83, 152/80, 145/96, 154/90, 157/81.  Daughter states that her BP will increase to 180s/90s when at the doctors office or at her therapy appointments.  Her last visit with you was 05/01/2017 and she has a follow up on 09/03/2017.  Daughter wants to know if medication needs to be changed or if the patient needs to come in sooner?  Daughter states that the patient denies any headaches, dizziness, or chest pains.

## 2017-08-04 NOTE — Telephone Encounter (Signed)
Patient's daughter called on behalf of patient and wants to speak with someone about her BP meds

## 2017-08-04 NOTE — Telephone Encounter (Signed)
For patient's age those blood pressures from home are okay.  Also is reassuring she's not having any symptoms.  So continue current regimen and we will see her next month.    It appears like she has white coat syndrome on top of her hypertension and I do not recommend situational blood pressure medicines for those times.  Deep breathing, relaxation, meditation etc. is recommended to help control blood pressure and anxiety and worry during those times.

## 2017-08-04 NOTE — Progress Notes (Signed)
Subjective:    Patient ID: [name] [name], female    DOB: September 03, 1946, 71 y.o.   MRN: [id number]  HPI   [name] [name] is here in follow up of her CVA and associated left hemiparesis. She has been making progress. She is working with outpt PT, OT, SLP at neuro-rehab. Her right hand is improving but is still limited in ROM and functional use. She is doing stretches daily. Daughter notices som ongoing isues with cognition/memory but she can function reasonably well within the house on her own.   Her bp is a concern and has been elevated recently again. Daughter reviewed bp's from over a week+ period and most of these revealed either dbp or sbp elevation.   Pain Inventory Average Pain 0 Pain Right Now 0 My pain is    In the last 24 hours, has pain interfered with the following? General activity 0 Relation with others 0 Enjoyment of life 0 What TIME of day is your pain at its worst? na Sleep (in general) Poor  Pain is worse with: na Pain improves with: na Relief from Meds: na  Mobility walk without assistance  Function I need assistance with the following:  bathing and meal prep  Neuro/Psych weakness confusion anxiety  Prior Studies Any changes since last visit?  no  Physicians involved in your care Any changes since last visit?  no   Family History  Problem Relation Age of Onset  . Cancer Mother    Social History   Social History  . Marital status: Single    Spouse name: N/A  . Number of children: N/A  . Years of education: N/A   Social History Main Topics  . Smoking status: Never Smoker  . Smokeless tobacco: Never Used  . Alcohol use No  . Drug use: No  . Sexual activity: No   Other Topics Concern  . None   Social History Narrative  . None   Past Surgical History:  Procedure Laterality Date  . ENDARTERECTOMY Left 04/08/2017   Procedure: ENDARTERECTOMY CAROTID;  Surgeon: Waynetta Sandy, MD;  Location: [place];  Service: Vascular;  Laterality:  Left;  . PATCH ANGIOPLASTY Left 04/08/2017   Procedure: PATCH ANGIOPLASTY Left Carotid;  Surgeon: Waynetta Sandy, MD;  Location: [place];  Service: Vascular;  Laterality: Left;   Past Medical History:  Diagnosis Date  . Hypertension   . Stroke ([place])    BP (!) 184/79   Pulse (!) 102   SpO2 97%   Opioid Risk Score:   Fall Risk Score:  `1  Depression screen PHQ 2/9  Depression screen [facility] 2/9 06/02/2017 05/01/2017  Decreased Interest 0 0  Down, Depressed, Hopeless 0 1  PHQ - 2 Score 0 1  Altered sleeping 1 -  Tired, decreased energy 0 -  Change in appetite 0 -  Feeling bad or failure about yourself  0 -  Trouble concentrating 0 -  Moving slowly or fidgety/restless 0 -  Suicidal thoughts 0 -  PHQ-9 Score 1 -     Review of Systems  Constitutional: Negative.   HENT: Negative.   Eyes: Negative.   Respiratory: Negative.   Cardiovascular: Negative.   Gastrointestinal: Negative.   Endocrine: Negative.   Genitourinary: Negative.   Musculoskeletal: Negative.   Skin: Negative.   Allergic/Immunologic: Negative.   Neurological: Negative.   Hematological: Negative.   Psychiatric/Behavioral: Negative.   All other systems reviewed and are negative.      Objective:   Physical Exam  Constitutional: She appears well-developed and well-nourished. NAD.  HENT: Normocephalic. Neck CDI Eyes: EOMI. No discharge.  Cardiovascular: RRR without murmur. No JVD  Respiratory: CTA Bilaterally without wheezes or rales. Normal effort  GI: Soft. Bowel sounds are normal.  Musculoskeletal: She exhibits moinimal edema .  Neurological: She is alert and oriented x3 Motor:  Motor: Right shoulder abduction 4-/5, elbow flex/ext 4-/5, HI 4-/5.  RLE: 4+/5 proximal to distal LUE/LLE: 5/5 proximal to distal Mild right inattention almost resolved. Improved processing. Spelled "earth" forward and backward. Gait improved with slight slump to right. Doesn't swing arm.  Skin: Neck incision  c/d/i Psychiatric: She has a normal mood and affect. Her behavior is normal      Assessment & Plan:  Medical Problem List and Plan: 1. 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 -making functional gains             -continue with outpt therapies, PT, OT, SLP  -reviewed strategies to improve gait quality and right hand ROM  -needs to keep up with regular HEP 2.  Hypertension. Norvasc  10mg , Lisinopril 10mg  -titrate lisinopril to 20mg  today 3. Hyperlipidemia. Lipitor  15 minutes in counseling and coordination of care, discussion of therapy/bp/etc. Follow up in 3 month. Greater than 50% of time during this encounter was spent counseling patient/family in regard to gait and ROM education, completion of paperwork, etc. .

## 2017-08-04 NOTE — Patient Instructions (Addendum)
PLEASE FEEL FREE TO CALL OUR OFFICE WITH ANY PROBLEMS OR QUESTIONS ([phone])    Continue to work daily on exercises to improve your right hand strength and range of motion. Also try to stretch your brain too!!

## 2017-08-05 ENCOUNTER — Ambulatory Visit: Payer: BLUE CROSS/BLUE SHIELD | Admitting: Occupational Therapy

## 2017-08-05 ENCOUNTER — Ambulatory Visit: Payer: BLUE CROSS/BLUE SHIELD | Admitting: Physical Therapy

## 2017-08-05 VITALS — BP 178/85

## 2017-08-05 DIAGNOSIS — M6281 Muscle weakness (generalized): Secondary | ICD-10-CM

## 2017-08-05 DIAGNOSIS — I69351 Hemiplegia and hemiparesis following cerebral infarction affecting right dominant side: Secondary | ICD-10-CM | POA: Diagnosis not present

## 2017-08-05 DIAGNOSIS — R278 Other lack of coordination: Secondary | ICD-10-CM

## 2017-08-05 DIAGNOSIS — R2681 Unsteadiness on feet: Secondary | ICD-10-CM

## 2017-08-05 DIAGNOSIS — R2689 Other abnormalities of gait and mobility: Secondary | ICD-10-CM

## 2017-08-05 NOTE — Therapy (Signed)
[facility] [address] [place], [place], [address] Phone: [phone]   Fax:  [phone]  Physical Therapy Treatment  Patient Details  Name: [name] [name] MRN: [id number] Date of Birth: Aug 21, 1946 Referring Provider: Alger Simons, MD  Encounter Date: 08/05/2017      PT End of Session - 08/05/17 1604    Visit Number 6   Number of Visits 14   Date for PT Re-Evaluation 08/28/17   Authorization Type [employer] 14 Visits left for PT   Authorization - Visit Number 6   Authorization - Number of Visits 14   PT Start Time 1219   PT Stop Time 1611   PT Time Calculation (min) 41 min   Equipment Utilized During Treatment Gait belt   Activity Tolerance Patient tolerated treatment well;Patient limited by fatigue   Behavior During Therapy [facility] for tasks assessed/performed      Past Medical History:  Diagnosis Date  . Hypertension   . Stroke [name])     Past Surgical History:  Procedure Laterality Date  . ENDARTERECTOMY Left 04/08/2017   Procedure: ENDARTERECTOMY CAROTID;  Surgeon: Waynetta Sandy, MD;  Location: [place];  Service: Vascular;  Laterality: Left;  . PATCH ANGIOPLASTY Left 04/08/2017   Procedure: PATCH ANGIOPLASTY Left Carotid;  Surgeon: Waynetta Sandy, MD;  Location: [place];  Service: Vascular;  Laterality: Left;    There were no vitals filed for this visit.      Subjective Assessment - 08/05/17 1531    Subjective doing well; no complaints   Patient Stated Goals "To be back to where I was physically."    Currently in Pain? No/denies                         [facility] Adult PT Treatment/Exercise - 08/05/17 1603      Knee/Hip Exercises: Aerobic   Nustep L4 x 8 min             Balance Exercises - 08/05/17 1552      Balance Exercises: Standing   Rockerboard Anterior/posterior;Lateral;Head turns;EC;10 seconds;5 reps;Intermittent UE support   Other Standing Exercises  single tap/double tap/kicking over and uprighting cones on compliant surface with minguard A             PT Short Term Goals - 08/01/17 1412      PT SHORT TERM GOAL #1   Title Pt/daughter will be independent with initial HEP in order to indicate improved functional mobility and balance.    Baseline 08/01/17: pt able to perform all current HEP with handouts, no cues needed.    Time --   Period --   Status Achieved     PT SHORT TERM GOAL #2   Title Pt will improve gait speed to 2.30 ft/sec w/ LRAD in order to indicate decreased fall risk.     Baseline 08/01/17: 2.04 ft/sec with no AD, improved just not to goal   Time --   Period --   Status Partially Met     PT SHORT TERM GOAL #3   Title Pt will improve 5TSS to <16 secs w/ single UE support in order to indicate decreased fall risk and improved functional strength.     Baseline 08/01/17: 15.53 sec's today with single UE support   Time --   Period --   Status Achieved     PT SHORT TERM GOAL #4   Title Will assess DGI and write appropriate LTG in order  to better assess balance.    Baseline 07/16/17: met today with PT to set goal.   Status Achieved     PT SHORT TERM GOAL #5   Title Pt will negotiate up/down 16 stairs with B rails at mod I level in order to indicate safety getting to upstairs bathroom.     Baseline 08/01/17: met today   Time --   Period --   Status Achieved           PT Long Term Goals - 07/10/17 2010      PT LONG TERM GOAL #1   Title Pt/daughter will be independent with final HEP in order to indicate improved functional mobility and decreased fall risk.     Time 7   Period Weeks   Status New   Target Date 08/28/17     PT LONG TERM GOAL #2   Title Pt will improve 5TSS to <15 secs without UE support in order to indicate decreased fall risk and improved functional strength.     Time 7   Period Weeks   Status New   Target Date 08/28/17     PT LONG TERM GOAL #3   Title Pt will demonstrate ability to  step into/out of tub with light UE support (on wall) at mod I level in order to indicate safe return to home.    Time 7   Period Weeks   Status New   Target Date 08/28/17     PT LONG TERM GOAL #4   Title Pt will improve gait speed to >/=2.62 ft/sec in order to indicate decreased fall risk and improved efficiency of gait.     Time 7   Period Weeks   Status New   Target Date 08/28/17     PT LONG TERM GOAL #5   Title Pt will ambulate up to 500' over unlevel paved surfaces (including ramp/curb) at distant S level (due to cognition) in order to indicate safe return to community.    Time 7   Period Weeks   Status New   Target Date 08/28/17               Plan - 08/05/17 1604    Clinical Impression Statement Pt tolerated session well today but reports increased fatigue with SLS activities needing occasional rest breaks.  Overall balance is very good, pt needs cues for increasing speed of movement.  Progressing well with PT.   PT Treatment/Interventions ADLs/Self Care Home Management;DME Instruction;Gait training;Stair training;Functional mobility training;Therapeutic activities;Therapeutic exercise;Balance training;Neuromuscular re-education;Cognitive remediation;Patient/family education;Orthotic Fit/Training;Energy conservation;Vestibular   PT Next Visit Plan Check BP each visit; continue to work on LE strengthening and balance. compliant surface activities   Consulted and Agree with Plan of Care Patient      Patient will benefit from skilled therapeutic intervention in order to improve the following deficits and impairments:  Abnormal gait, Decreased activity tolerance, Decreased balance, Decreased cognition, Decreased endurance, Decreased mobility, Decreased strength, Impaired perceived functional ability, Impaired flexibility, Impaired sensation, Improper body mechanics  Visit Diagnosis: Muscle weakness (generalized)  Hemiplegia and hemiparesis following cerebral infarction  affecting right dominant side (HCC)  Unsteadiness on feet  Other abnormalities of gait and mobility     Problem List Patient Active Problem List   Diagnosis Date Noted  . Glucose intolerance (impaired glucose tolerance) 05/01/2017  . Depression 05/01/2017  . Hypertensive crisis   . Acute bilat watershed infarction [facility]) 04/09/2017  . Intracranial vascular stenosis   . Hemiparesis affecting dominant side  as late effect of stroke ([place])   . Post-operative pain   . S/P carotid endarterectomy   . Essential hypertension   . Stenosis of left carotid artery   . Hyperlipidemia   . Gait disturbance, post-stroke 04/02/2017  . Right arm weakness   . Slowness and poor responsiveness   . Hypertensive emergency 04/01/2017  . Hypokalemia 04/01/2017  . Hyperglycemia 04/01/2017  . Stroke-like symptoms 04/01/2017  . 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 ([place]) 04/01/2017      Laureen Abrahams, PT, DPT 08/05/17 4:12 PM    [facility] [address] [place], [place], [address] Phone: [phone]   Fax:  [phone]  Name: [name] [name] MRN: [id number] Date of Birth: [birth year]

## 2017-08-05 NOTE — Therapy (Signed)
[facility] [address] [place], [place], [address] Phone: [phone]   Fax:  [phone]  Occupational Therapy Treatment  Patient Details  Name: [name] [name] MRN: [id number] Date of Birth: 22-Feb-1946 Referring Provider: Dr. Donetta Potts  Encounter Date: 08/05/2017      OT End of Session - 08/05/17 1448    Visit Number 3   Number of Visits 14   Date for OT Re-Evaluation 08/28/17   Authorization Type BC/BS MCR - G code required   Authorization Time Period At evaluation - 14 visits remaining for OT    Authorization - Visit Number 3   Authorization - Number of Visits 10   OT Start Time 4315   OT Stop Time 1530   OT Time Calculation (min) 43 min   Activity Tolerance Patient tolerated treatment well   Behavior During Therapy [facility] for tasks assessed/performed      Past Medical History:  Diagnosis Date  . Hypertension   . Stroke [facility])     Past Surgical History:  Procedure Laterality Date  . ENDARTERECTOMY Left 04/08/2017   Procedure: ENDARTERECTOMY CAROTID;  Surgeon: Waynetta Sandy, MD;  Location: [place];  Service: Vascular;  Laterality: Left;  . 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 No/denies            Treatment: Ball exercises in supine for shoulder flexion, and chest press, 10 reps each, min/ mod v.c/ facilitation Seated midrange shoulder flexion with PVC pipe frame, min-mod  facilitation for RUE. Seated flipping and dealing playing cards with RUE, min-mod difficulty/ v.c for increased RUE functional use. Standing to place graded clothespins on vertical antennae with RUE, min v.c Arm bike x 5 mins level 1 for reciprocal movement.                    OT Short Term Goals - 07/10/17 1703      OT SHORT TERM GOAL #1   Title Independent with coordination and putty HEP    Time 3   Period Weeks   Status New   Target Date 07/31/17     OT SHORT TERM GOAL #2   Title Pt to tie shoes or able to wear tie up shoes with A/E prn   Time 3   Period Weeks   Status New     OT SHORT TERM GOAL #3   Title pt to safely perform tub/shower transfer with DME prn   Time 3   Period Weeks   Status New     OT SHORT TERM GOAL #4   Title Pt to perform simple cold meal, microwaveable meals, and snack prep mod I level consistently    Time 3   Period Weeks   Status New     OT SHORT TERM GOAL #5   Title Pt to consistently participate in light household tasks (folding clothes, washing dishes)   Time 3  Period Weeks   Status New           OT Long Term Goals - 07/10/17 1707      OT LONG TERM GOAL #1   Title Independent with updated HEP    Time 7   Period Weeks   Status New   Target Date 08/28/17     OT LONG TERM GOAL #2   Title Pt to improve coordination Rt hand as evidenced by reducing speed on 9 hole peg test to 38 sec. or under   Baseline eval: 50 sec   Time 7   Period Weeks   Status New     OT LONG TERM GOAL #3   Title Pt to improve grip strength Rt dominant hand to 25 lbs or greater to assist with opening jars/containers   Baseline eval: 10 lbs (Lt = 58 lbs)    Time 7   Period Weeks   Status New     OT LONG TERM GOAL #4   Title Pt to retrieve/replace 2 lb. object from high level shelf RUE with no reports of pain   Time 7   Period Weeks   Status New     OT LONG TERM GOAL #5   Title Pt to make simple meal mod I level safely   Time 7    Period Weeks   Status New     Long Term Additional Goals   Additional Long Term Goals Yes     OT LONG TERM GOAL #6   Title Pt/family to verbalize understanding with necessary DME, modifications, and external aids/devices to put in place if pt were to return to living alone   Time 7   Period Weeks   Status New               Plan - 08/05/17 1524    Clinical Impression Statement Pt is progressing towards goals for RUE functional use.   Rehab Potential Good   Current Impairments/barriers affecting progress: chronic [place] vessel disease making her more prone to future strokes   OT Frequency 2x / week   OT Duration --  7 weeks   OT Treatment/Interventions Self-care/ADL training;Moist Heat;DME and/or AE instruction;Splinting;Patient/family education;Therapeutic exercises;Therapeutic activities;Passive range of motion;Neuromuscular education;Functional Mobility Training;Manual Therapy;Visual/perceptual remediation/compensation;Cognitive remediation/compensation   Plan work towards goals, continue RUE functional use, monitor BP   Consulted and Agree with Plan of Care Patient      Patient will benefit from skilled therapeutic intervention in order to improve the following deficits and impairments:  Decreased coordination, Decreased range of motion, Impaired flexibility, Decreased safety awareness, Decreased endurance, Impaired sensation, Decreased knowledge of precautions, Impaired UE functional use, Pain, Decreased mobility, Decreased strength, Decreased cognition, Impaired vision/preception  Visit Diagnosis: Muscle weakness (generalized)  Hemiplegia and hemiparesis following cerebral infarction affecting right dominant side (HCC)  Other lack of coordination    Problem List Patient Active Problem List   Diagnosis Date Noted  . Glucose intolerance (impaired glucose tolerance) 05/01/2017  . Depression 05/01/2017  . Hypertensive crisis   . Acute bilat watershed infarction [facility])  04/09/2017  . Intracranial vascular stenosis   . Hemiparesis affecting dominant side as late effect of stroke ([place])   . Post-operative pain   . S/P carotid endarterectomy   . Essential hypertension   . Stenosis of left carotid artery   . Hyperlipidemia   . Gait disturbance, post-stroke 04/02/2017  . Right arm weakness   . Slowness and poor responsiveness   . Hypertensive emergency 04/01/2017  .  Hypokalemia 04/01/2017  . Hyperglycemia 04/01/2017  . Stroke-like symptoms 04/01/2017  . 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 ([place]) 04/01/2017    Doyle Tegethoff 08/05/2017, 3:25 PM  [facility] [address] [place], [place], [address] Phone: [phone]   Fax:  [phone]  Name: [name] [name] MRN: [id number] Date of Birth: Feb 03, 1946

## 2017-08-05 NOTE — Telephone Encounter (Signed)
[name] informed.  She expressed understanding and is agreeable.  T. Nelson, CMA 

## 2017-08-07 ENCOUNTER — Ambulatory Visit: Payer: BLUE CROSS/BLUE SHIELD | Admitting: Speech Pathology

## 2017-08-07 ENCOUNTER — Ambulatory Visit: Payer: BLUE CROSS/BLUE SHIELD | Admitting: Occupational Therapy

## 2017-08-07 ENCOUNTER — Ambulatory Visit: Payer: BLUE CROSS/BLUE SHIELD | Admitting: Physical Therapy

## 2017-08-07 VITALS — BP 172/79

## 2017-08-07 VITALS — BP 163/79 | HR 103

## 2017-08-07 DIAGNOSIS — R278 Other lack of coordination: Secondary | ICD-10-CM

## 2017-08-07 DIAGNOSIS — I69351 Hemiplegia and hemiparesis following cerebral infarction affecting right dominant side: Secondary | ICD-10-CM

## 2017-08-07 DIAGNOSIS — M6281 Muscle weakness (generalized): Secondary | ICD-10-CM

## 2017-08-07 DIAGNOSIS — R2681 Unsteadiness on feet: Secondary | ICD-10-CM

## 2017-08-07 DIAGNOSIS — R2689 Other abnormalities of gait and mobility: Secondary | ICD-10-CM

## 2017-08-07 DIAGNOSIS — R41841 Cognitive communication deficit: Secondary | ICD-10-CM

## 2017-08-07 NOTE — Therapy (Signed)
[facility] [address] [place], [place], [address] Phone: [phone]   Fax:  [phone]  Occupational Therapy Treatment  Patient Details  Name: [name] [name] MRN: [id number] Date of Birth: [birth year] Referring Provider: Dr. Donetta Potts  Encounter Date: 08/07/2017      OT End of Session - 08/07/17 1535    Visit Number 4   Number of Visits 14   Date for OT Re-Evaluation 08/28/17   Authorization Type BC/BS MCR - G code required   Authorization Time Period At evaluation - 14 visits remaining for OT    Authorization - Visit Number 4   Authorization - Number of Visits 10   OT Start Time 3846   OT Stop Time 1615   OT Time Calculation (min) 40 min      Past Medical History:  Diagnosis Date  . Hypertension   . Stroke [facility])     Past Surgical History:  Procedure Laterality Date  . ENDARTERECTOMY Left 04/08/2017   Procedure: ENDARTERECTOMY CAROTID;  Surgeon: Waynetta Sandy, MD;  Location: [place];  Service: Vascular;  Laterality: Left;  . 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 No/denies                 treatment: supine shoulder flexion and chest press with bilateral UE's holding medium ball, min v.c/ facilitation, followed by unilateral RUE shoulder flexion x 10 followed by unilateral RUE shoulder flexion, x 10 reps, min v.c Seated shoulder flexion with PVC pipe frame min facilitation for upright posture, 10  reps Standing for dynamic functional mid range reaching with RUE to place large pegs in vertical pegboard for increased fine motor coordination, min difficulty/ v.c Arm bike x 6 mins level 1 for reciprocal                 OT Short Term Goals - 07/10/17 1703      OT SHORT TERM GOAL #1   Title Independent with coordination and putty HEP    Time 3   Period Weeks   Status New   Target Date 07/31/17     OT SHORT TERM GOAL #2   Title Pt to tie shoes or able to wear tie up shoes with A/E prn   Time 3   Period Weeks   Status New     OT SHORT TERM GOAL #3   Title pt to safely perform tub/shower transfer with DME prn   Time 3   Period Weeks   Status New     OT SHORT TERM GOAL #4   Title Pt to perform simple cold meal, microwaveable meals, and snack prep mod I level consistently    Time 3   Period Weeks   Status New     OT SHORT TERM GOAL #5   Title Pt to consistently participate in light household tasks (folding clothes, washing dishes)   Time 3   Period Weeks   Status New  OT Long Term Goals - 07/10/17 1707      OT LONG TERM GOAL #1   Title Independent with updated HEP    Time 7   Period Weeks   Status New   Target Date 08/28/17     OT LONG TERM GOAL #2   Title Pt to improve coordination Rt hand as evidenced by reducing speed on 9 hole peg test to 38 sec. or under   Baseline eval: 50 sec   Time 7   Period Weeks   Status New     OT LONG TERM GOAL #3   Title Pt to improve grip strength Rt dominant hand to 25 lbs or greater to assist with opening jars/containers   Baseline eval: 10 lbs (Lt = 58 lbs)    Time 7   Period Weeks   Status New     OT LONG TERM GOAL #4   Title Pt to retrieve/replace 2 lb. object from high level shelf RUE with no reports of pain   Time 7   Period Weeks   Status New     OT LONG TERM GOAL #5   Title Pt to make simple meal mod I level safely   Time 7   Period Weeks   Status New     Long Term Additional Goals    Additional Long Term Goals Yes     OT LONG TERM GOAL #6   Title Pt/family to verbalize understanding with necessary DME, modifications, and external aids/devices to put in place if pt were to return to living alone   Time 7   Period Weeks   Status New               Plan - 08/07/17 1542    Clinical Impression Statement Pt is progressing towards goals with improving RUE contol and functional use.   Rehab Potential Good   Current Impairments/barriers affecting progress: chronic [place] vessel disease making her more prone to future strokes   OT Frequency 2x / week   OT Duration --  7 weeks   OT Treatment/Interventions Self-care/ADL training;Moist Heat;DME and/or AE instruction;Splinting;Patient/family education;Therapeutic exercises;Therapeutic activities;Passive range of motion;Neuromuscular education;Functional Mobility Training;Manual Therapy;Visual/perceptual remediation/compensation;Cognitive remediation/compensation   Plan RUE functional use, monitor BP   Consulted and Agree with Plan of Care Patient      Patient will benefit from skilled therapeutic intervention in order to improve the following deficits and impairments:  Decreased coordination, Decreased range of motion, Impaired flexibility, Decreased safety awareness, Decreased endurance, Impaired sensation, Decreased knowledge of precautions, Impaired UE functional use, Pain, Decreased mobility, Decreased strength, Decreased cognition, Impaired vision/preception  Visit Diagnosis: Muscle weakness (generalized)  Hemiplegia and hemiparesis following cerebral infarction affecting right dominant side (HCC)  Other lack of coordination    Problem List Patient Active Problem List   Diagnosis Date Noted  . Glucose intolerance (impaired glucose tolerance) 05/01/2017  . Depression 05/01/2017  . Hypertensive crisis   . Acute bilat watershed infarction [facility]) 04/09/2017  . Intracranial vascular stenosis   . Hemiparesis  affecting dominant side as late effect of stroke ([place])   . Post-operative pain   . S/P carotid endarterectomy   . Essential hypertension   . Stenosis of left carotid artery   . Hyperlipidemia   . Gait disturbance, post-stroke 04/02/2017  . Right arm weakness   . Slowness and poor responsiveness   . Hypertensive emergency 04/01/2017  . Hypokalemia 04/01/2017  . Hyperglycemia 04/01/2017  . Stroke-like symptoms 04/01/2017  . Cerebrovascular accident (  CVA) due to embolism of left carotid artery ([place]) 04/01/2017    Usman Millett 08/07/2017, 3:47 PM Theone Murdoch, OTR/L [phone] Phone: [phone] 3:59 PM 08/07/17 [facility] [address] [place], [place], [address] Phone: [phone]   Fax:  [phone]  Name: [name] [name] MRN: [id number] Date of Birth: 1945/12/31

## 2017-08-07 NOTE — Therapy (Signed)
[facility] [address] [place], [place], [address] Phone: [phone]   Fax:  [phone]  Speech Language Pathology Treatment  Patient Details  Name: [name] [name] MRN: [id number] Date of Birth: 12/04/1945 Referring Provider: Dr. Alger Simons  Encounter Date: 08/07/2017      End of Session - 08/07/17 1840    Visit Number 5   Number of Visits 17   Date for SLP Re-Evaluation 09/05/17   Authorization Type [employer] medicare    Authorization - Visit Number 5   Authorization - Number of Visits 24   SLP Start Time 3790   SLP Stop Time  2409   SLP Time Calculation (min) 44 min   Activity Tolerance Patient tolerated treatment well      Past Medical History:  Diagnosis Date  . Hypertension   . Stroke [facility])     Past Surgical History:  Procedure Laterality Date  . ENDARTERECTOMY Left 04/08/2017   Procedure: ENDARTERECTOMY CAROTID;  Surgeon: Waynetta Sandy, MD;  Location: [place];  Service: Vascular;  Laterality: Left;  . PATCH ANGIOPLASTY Left 04/08/2017   Procedure: PATCH ANGIOPLASTY Left Carotid;  Surgeon: Waynetta Sandy, MD;  Location: [place];  Service: Vascular;  Laterality: Left;    There were no vitals filed for this visit.      Subjective Assessment - 08/07/17 1750    Subjective "Well, nothing's changed." Pt does not provide/recall details from follow-up with Dr. Naaman Plummer               ADULT SLP TREATMENT - 08/07/17 0001      General Information   Behavior/Cognition Alert;Cooperative;Pleasant mood  pleasant, but flat affect   Patient Positioning Upright in chair     Treatment Provided   Treatment provided Cognitive-Linquistic     Pain Assessment   Pain Assessment No/denies pain     Cognitive-Linquistic Treatment   Treatment focused on Cognition   Skilled Treatment Functional math targeted with counting money, simple money problems. Pt required extended time, min A for  error awareness, 83% accuracy in counting task. With simple money problems, pt required occasional min A for initiation, selective attention, 80% accuracy. SLP educated re: strategies to improve attention, demo'd checking off completed items to track progress, however pt required verbal cues to initiate and continue using this strategy.      Assessment / Recommendations / Plan   Plan Continue with current plan of care     Progression Toward Goals   Progression toward goals Progressing toward goals            SLP Short Term Goals - 08/07/17 1454      SLP SHORT TERM GOAL #1   Title Pt will selectively attend to simple cogntive linguistic problem solving task in mildly distracting environment with 90% accuracy and occasional min A   Time 2   Period Weeks   Status On-going          SLP Long Term Goals - 07/28/17 1753      SLP LONG TERM GOAL #1   Title Pt will utlize external aids to manage schedule, meds, and finances with occasional min A over 2 sessionsl    Time 6   Period Weeks   Status On-going     SLP LONG TERM GOAL #2   Title Pt will alternate attention between 2 mildly complex cognitive linguistic tasks with 85% on each and occasional min A   Time 6   Period Weeks  Status On-going     SLP LONG TERM GOAL #3   Title Pt will solve mildly complex reasoning, organization, time/money problems with 85% accuracy and occasional min A.   Time 6   Period Weeks   Status On-going          Plan - 08/07/17 1835    Clinical Impression Statement Pt demonstrates ongoing cognitive linguistic impairments in attention/attention to detail, and error awareness, putting her at high risk for decr'd safety awareness. Pt required extended processing time for all cognitive linguistic tasks today, as well as occasional cues for initiation, selective attention. During cognitive tasks, pt was consistently distracted from task by SLP taking session notes on the computer. Skilled ST is  recommend to continue to maximize cognitive for pt to eventually return to living independently.   Speech Therapy Frequency 2x / week   Treatment/Interventions Compensatory strategies;Functional tasks;Patient/family education;Cognitive reorganization;Environmental controls;Internal/external aids;SLP instruction and feedback;Language facilitation   Potential to Achieve Goals Good   SLP Home Exercise Plan reviewed/provided   Consulted and Agree with Plan of Care Patient      Patient will benefit from skilled therapeutic intervention in order to improve the following deficits and impairments:   Cognitive communication deficit    Problem List Patient Active Problem List   Diagnosis Date Noted  . Glucose intolerance (impaired glucose tolerance) 05/01/2017  . Depression 05/01/2017  . Hypertensive crisis   . Acute bilat watershed infarction [facility]) 04/09/2017  . Intracranial vascular stenosis   . Hemiparesis affecting dominant side as late effect of stroke ([place])   . Post-operative pain   . S/P carotid endarterectomy   . Essential hypertension   . Stenosis of left carotid artery   . Hyperlipidemia   . Gait disturbance, post-stroke 04/02/2017  . Right arm weakness   . Slowness and poor responsiveness   . Hypertensive emergency 04/01/2017  . Hypokalemia 04/01/2017  . Hyperglycemia 04/01/2017  . Stroke-like symptoms 04/01/2017  . 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 ([place]) 04/01/2017   Deneise Lever, [place], CCC-SLP Speech-Language Pathologist   Aliene Altes 08/07/2017, 6:41 PM  [facility] [address] [place], [place], [address] Phone: [phone]   Fax:  [phone]   Name: Marisela Line MRN: [id number] Date of Birth: [birth year]

## 2017-08-07 NOTE — Therapy (Signed)
[facility] [address] [place], [place], [address] Phone: [phone]   Fax:  [phone]  Physical Therapy Treatment  Patient Details  Name: [name] [name] MRN: [id number] Date of Birth: [birth year] Referring Provider: Alger Simons, MD  Encounter Date: 08/07/2017      PT End of Session - 08/07/17 1442    Visit Number 7   Number of Visits 14   Date for PT Re-Evaluation 08/28/17   Authorization Type [employer] 14 Visits left for PT   Authorization - Visit Number 7   Authorization - Number of Visits 14   PT Start Time 1400   PT Stop Time 1440   PT Time Calculation (min) 40 min   Equipment Utilized During Treatment Gait belt   Activity Tolerance Patient tolerated treatment well   Behavior During Therapy [facility] for tasks assessed/performed      Past Medical History:  Diagnosis Date  . Hypertension   . Stroke [facility])     Past Surgical History:  Procedure Laterality Date  . ENDARTERECTOMY Left 04/08/2017   Procedure: ENDARTERECTOMY CAROTID;  Surgeon: Waynetta Sandy, MD;  Location: [place];  Service: Vascular;  Laterality: Left;  . PATCH ANGIOPLASTY Left 04/08/2017   Procedure: PATCH ANGIOPLASTY Left Carotid;  Surgeon: Waynetta Sandy, MD;  Location: [place];  Service: Vascular;  Laterality: Left;    Vitals:   08/07/17 1402 08/07/17 1415 08/07/17 1429  BP: (!) 182/88 (!) 172/76 (!) 163/79  Pulse: (!) 109 98 (!) 103        Subjective Assessment - 08/07/17 1359    Subjective no new complaints; still having trouble with tandem walking and amb on toes   Patient Stated Goals "To be back to where I was physically."    Currently in Pain? No/denies                         [facility] Adult PT Treatment/Exercise - 08/07/17 1406      Knee/Hip Exercises: Standing   Forward Step Up Both;10 reps;Hand Hold: 1;Step Height: 6"   SLS taps to 1st/2nd/1st steps x 10 bil without UE support with MInguard  A     Knee/Hip Exercises: Seated   Long Arc Quad Both;10 reps   Long Arc Quad Weight 3 lbs.   Marching Both;10 reps   Marching Weights 3 lbs.   Hamstring Curl Both;10 reps   Hamstring Limitations green theraband     Knee/Hip Exercises: Supine   Short Arc Quad Sets Both;10 reps   Short Arc Quad Sets Limitations 3#   Bridges Both;10 reps   Straight Leg Raises Both;10 reps   Straight Leg Raises Limitations 3#   Other Supine Knee/Hip Exercises hooklying single limb abuction with green theraband x 10 reps bil                  PT Short Term Goals - 08/01/17 1412      PT SHORT TERM GOAL #1   Title Pt/daughter will be independent with initial HEP in order to indicate improved functional mobility and balance.    Baseline 08/01/17: pt able to perform all current HEP with handouts, no cues needed.    Time --   Period --   Status Achieved     PT SHORT TERM GOAL #2   Title Pt will improve gait speed to 2.30 ft/sec w/ LRAD in order to indicate decreased fall risk.     Baseline 08/01/17: 2.04 ft/sec  with no AD, improved just not to goal   Time --   Period --   Status Partially Met     PT SHORT TERM GOAL #3   Title Pt will improve 5TSS to <16 secs w/ single UE support in order to indicate decreased fall risk and improved functional strength.     Baseline 08/01/17: 15.53 sec's today with single UE support   Time --   Period --   Status Achieved     PT SHORT TERM GOAL #4   Title Will assess DGI and write appropriate LTG in order to better assess balance.    Baseline 07/16/17: met today with PT to set goal.   Status Achieved     PT SHORT TERM GOAL #5   Title Pt will negotiate up/down 16 stairs with B rails at mod I level in order to indicate safety getting to upstairs bathroom.     Baseline 08/01/17: met today   Time --   Period --   Status Achieved           PT Long Term Goals - 07/10/17 2010      PT LONG TERM GOAL #1   Title Pt/daughter will be independent with final  HEP in order to indicate improved functional mobility and decreased fall risk.     Time 7   Period Weeks   Status New   Target Date 08/28/17     PT LONG TERM GOAL #2   Title Pt will improve 5TSS to <15 secs without UE support in order to indicate decreased fall risk and improved functional strength.     Time 7   Period Weeks   Status New   Target Date 08/28/17     PT LONG TERM GOAL #3   Title Pt will demonstrate ability to step into/out of tub with light UE support (on wall) at mod I level in order to indicate safe return to home.    Time 7   Period Weeks   Status New   Target Date 08/28/17     PT LONG TERM GOAL #4   Title Pt will improve gait speed to >/=2.62 ft/sec in order to indicate decreased fall risk and improved efficiency of gait.     Time 7   Period Weeks   Status New   Target Date 08/28/17     PT LONG TERM GOAL #5   Title Pt will ambulate up to 500' over unlevel paved surfaces (including ramp/curb) at distant S level (due to cognition) in order to indicate safe return to community.    Time 7   Period Weeks   Status New   Target Date 08/28/17               Plan - 08/07/17 1443    Clinical Impression Statement Pt's BP elevated today at beginning of session so mat level exercises performed initially then as BP lowered progressed to standing exercises.  Tolerated session well today.  Progressing towards LTGs.   PT Treatment/Interventions ADLs/Self Care Home Management;DME Instruction;Gait training;Stair training;Functional mobility training;Therapeutic activities;Therapeutic exercise;Balance training;Neuromuscular re-education;Cognitive remediation;Patient/family education;Orthotic Fit/Training;Energy conservation;Vestibular   PT Next Visit Plan Check BP each visit; continue to work on LE strengthening and balance. compliant surface activities   Consulted and Agree with Plan of Care Patient      Patient will benefit from skilled therapeutic intervention in  order to improve the following deficits and impairments:  Abnormal gait, Decreased activity tolerance, Decreased balance, Decreased cognition, Decreased  endurance, Decreased mobility, Decreased strength, Impaired perceived functional ability, Impaired flexibility, Impaired sensation, Improper body mechanics  Visit Diagnosis: Muscle weakness (generalized)  Hemiplegia and hemiparesis following cerebral infarction affecting right dominant side (HCC)  Other lack of coordination  Unsteadiness on feet  Other abnormalities of gait and mobility     Problem List Patient Active Problem List   Diagnosis Date Noted  . Glucose intolerance (impaired glucose tolerance) 05/01/2017  . Depression 05/01/2017  . Hypertensive crisis   . Acute bilat watershed infarction [facility]) 04/09/2017  . Intracranial vascular stenosis   . Hemiparesis affecting dominant side as late effect of stroke ([place])   . Post-operative pain   . S/P carotid endarterectomy   . Essential hypertension   . Stenosis of left carotid artery   . Hyperlipidemia   . Gait disturbance, post-stroke 04/02/2017  . Right arm weakness   . Slowness and poor responsiveness   . Hypertensive emergency 04/01/2017  . Hypokalemia 04/01/2017  . Hyperglycemia 04/01/2017  . Stroke-like symptoms 04/01/2017  . 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 ([place]) 04/01/2017      Laureen Abrahams, PT, DPT 08/07/17 2:44 PM    [facility] [address] [place], [place], [address] Phone: [phone]   Fax:  [phone]  Name: Glinda Natzke MRN: [id number] Date of Birth: [birth year]

## 2017-08-13 ENCOUNTER — Ambulatory Visit: Payer: BLUE CROSS/BLUE SHIELD | Admitting: Rehabilitative and Restorative Service Providers"

## 2017-08-13 ENCOUNTER — Ambulatory Visit: Payer: BLUE CROSS/BLUE SHIELD | Attending: Physical Medicine & Rehabilitation | Admitting: Speech Pathology

## 2017-08-13 ENCOUNTER — Ambulatory Visit: Payer: BLUE CROSS/BLUE SHIELD | Admitting: Occupational Therapy

## 2017-08-13 VITALS — BP 179/79 | HR 95

## 2017-08-13 VITALS — BP 181/78

## 2017-08-13 DIAGNOSIS — R293 Abnormal posture: Secondary | ICD-10-CM | POA: Diagnosis present

## 2017-08-13 DIAGNOSIS — M25511 Pain in right shoulder: Secondary | ICD-10-CM | POA: Insufficient documentation

## 2017-08-13 DIAGNOSIS — M6281 Muscle weakness (generalized): Secondary | ICD-10-CM | POA: Insufficient documentation

## 2017-08-13 DIAGNOSIS — R482 Apraxia: Secondary | ICD-10-CM | POA: Diagnosis present

## 2017-08-13 DIAGNOSIS — R2681 Unsteadiness on feet: Secondary | ICD-10-CM | POA: Diagnosis present

## 2017-08-13 DIAGNOSIS — R41841 Cognitive communication deficit: Secondary | ICD-10-CM | POA: Insufficient documentation

## 2017-08-13 DIAGNOSIS — R278 Other lack of coordination: Secondary | ICD-10-CM | POA: Diagnosis present

## 2017-08-13 DIAGNOSIS — I69351 Hemiplegia and hemiparesis following cerebral infarction affecting right dominant side: Secondary | ICD-10-CM

## 2017-08-13 DIAGNOSIS — I69318 Other symptoms and signs involving cognitive functions following cerebral infarction: Secondary | ICD-10-CM | POA: Diagnosis present

## 2017-08-13 DIAGNOSIS — R2689 Other abnormalities of gait and mobility: Secondary | ICD-10-CM

## 2017-08-13 NOTE — Patient Instructions (Addendum)
  Use your calendar at home -  Look each day and night at the calendar to know what your appointments are  Predict what time you need to be ready by and plan your day  Keep a copy of your therapy schedule in your purple folder  Continue to do brain activities each day

## 2017-08-13 NOTE — Therapy (Signed)
[facility] [address] [place], [place], [address] Phone: [phone]   Fax:  [phone]  Occupational Therapy Treatment  Patient Details  Name: [name] [name] MRN: [id number] Date of Birth: 06-16-1946 Referring Provider: Dr. Donetta Potts  Encounter Date: 08/13/2017      OT End of Session - 08/13/17 1043    Visit Number 5   Number of Visits 14   Date for OT Re-Evaluation 08/28/17   Authorization Type BC/BS MCR - G code required   Authorization Time Period At evaluation - 14 visits remaining for OT    Authorization - Visit Number 5   Authorization - Number of Visits 10   OT Start Time 4081   OT Stop Time 1100   OT Time Calculation (min) 42 min   Activity Tolerance Patient tolerated treatment well   Behavior During Therapy [facility] for tasks assessed/performed      Past Medical History:  Diagnosis Date  . Hypertension   . Stroke [facility])     Past Surgical History:  Procedure Laterality Date  . ENDARTERECTOMY Left 04/08/2017   Procedure: ENDARTERECTOMY CAROTID;  Surgeon: Waynetta Sandy, MD;  Location: [place];  Service: Vascular;  Laterality: Left;  . PATCH ANGIOPLASTY Left 04/08/2017   Procedure: PATCH ANGIOPLASTY Left Carotid;  Surgeon: Waynetta Sandy, MD;  Location: [place];  Service: Vascular;  Laterality: Left;    Vitals:   08/13/17 1022  BP: (!) 181/78        Subjective Assessment - 08/13/17 1043    Subjective  doing ok   Pertinent History bilateral watershed infarction (Lt side worse than Rt) 04/01/17, Lt carotid endarterectomy 04/08/17, PMH: chronic [place] vessel dz, h/o smaller strokes   Limitations no driving   Patient Stated Goals return to cooking and get my Rt hand better   Currently in Pain? No/denies             Treatment:Functional overhead reaching with RUE to place / remove items and place on a lower surface to simulate puttting away groceries, min vc. For right  hand use Upgraded putty HEP to red, for mass grasp and tip pinch, min v.c, then pt returned demonstration. Checked progress towards short term goals, see goals. Grooved pegboard for RUE fine motor coordination/ functional use, min difficulty/ v.c Arm bike x 5 mins level 1, pt maintained grossly 25 rpm                   OT Short Term Goals - 08/13/17 1044      OT SHORT TERM GOAL #1   Title Independent with coordination and putty HEP    Time 3   Period Weeks   Status Achieved     OT SHORT TERM GOAL #2   Title Pt to tie shoes or able to wear tie up shoes with A/E prn   Time 3   Period Weeks   Status On-going     OT SHORT TERM GOAL #3   Title pt to safely perform tub/shower transfer with DME prn   Time 3   Period Weeks   Status On-going  Pt is performing with supervision     OT SHORT TERM GOAL #4   Title Pt to perform simple cold meal, microwaveable meals, and snack prep mod I level consistently    Time 3   Period Weeks   Status On-going  Pt has performed yet she is not performing consistently     OT SHORT TERM  GOAL #5   Title Pt to consistently participate in light household tasks (folding clothes, washing dishes)   Time 3   Period Weeks   Status Achieved  folds clothes           OT Long Term Goals - 07/10/17 1707      OT LONG TERM GOAL #1   Title Independent with updated HEP    Time 7   Period Weeks   Status New   Target Date 08/28/17     OT LONG TERM GOAL #2   Title Pt to improve coordination Rt hand as evidenced by reducing speed on 9 hole peg test to 38 sec. or under   Baseline eval: 50 sec   Time 7   Period Weeks   Status New     OT LONG TERM GOAL #3   Title Pt to improve grip strength Rt dominant hand to 25 lbs or greater to assist with opening jars/containers   Baseline eval: 10 lbs (Lt = 58 lbs)    Time 7   Period Weeks   Status New     OT LONG TERM GOAL #4   Title Pt to retrieve/replace 2 lb. object from high level shelf RUE  with no reports of pain   Time 7   Period Weeks   Status New     OT LONG TERM GOAL #5   Title Pt to make simple meal mod I level safely   Time 7   Period Weeks   Status New     Long Term Additional Goals   Additional Long Term Goals Yes     OT LONG TERM GOAL #6   Title Pt/family to verbalize understanding with necessary DME, modifications, and external aids/devices to put in place if pt were to return to living alone   Time 7   Period Weeks   Status New               Plan - 08/13/17 1048    Clinical Impression Statement Pt is progressing towards goals for RUE functional use and light home management   Rehab Potential Good   Current Impairments/barriers affecting progress: chronic [place] vessel disease making her more prone to future strokes   OT Frequency 2x / week   OT Duration --  7 weeks   OT Treatment/Interventions Self-care/ADL training;Moist Heat;DME and/or AE instruction;Splinting;Patient/family education;Therapeutic exercises;Therapeutic activities;Passive range of motion;Neuromuscular education;Functional Mobility Training;Manual Therapy;Visual/perceptual remediation/compensation;Cognitive remediation/compensation   Plan simple kitchen task      Patient will benefit from skilled therapeutic intervention in order to improve the following deficits and impairments:  Decreased coordination, Decreased range of motion, Impaired flexibility, Decreased safety awareness, Decreased endurance, Impaired sensation, Decreased knowledge of precautions, Impaired UE functional use, Pain, Decreased mobility, Decreased strength, Decreased cognition, Impaired vision/preception  Visit Diagnosis: Muscle weakness (generalized)  Hemiplegia and hemiparesis following cerebral infarction affecting right dominant side (HCC)  Other lack of coordination  Unsteadiness on feet    Problem List Patient Active Problem List   Diagnosis Date Noted  . Glucose intolerance (impaired glucose  tolerance) 05/01/2017  . Depression 05/01/2017  . Hypertensive crisis   . Acute bilat watershed infarction 04/09/2017  . Intracranial vascular stenosis   . Hemiparesis affecting dominant side as late effect of stroke ([place])   . Post-operative pain   . S/P carotid endarterectomy   . Essential hypertension   . Stenosis of left carotid artery   . Hyperlipidemia   . Gait disturbance, post-stroke 04/02/2017  .  Right arm weakness   . Slowness and poor responsiveness   . Hypertensive emergency 04/01/2017  . Hypokalemia 04/01/2017  . Hyperglycemia 04/01/2017  . Stroke-like symptoms 04/01/2017  . 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 ([place]) 04/01/2017    RINE,KATHRYN 08/13/2017, 12:03 PM  [facility] [address] [place], [place], [address] Phone: [phone]   Fax:  [phone]  Name: [name] [name] MRN: [id number] Date of Birth: Dec 27, 1945

## 2017-08-13 NOTE — Therapy (Signed)
[facility] [address] [place], [place], [address] Phone: [phone]   Fax:  [phone]  Speech Language Pathology Treatment  Patient Details  Name: [name] [name] MRN: [id number] Date of Birth: 12-24-1945 Referring Provider: Dr. Alger Simons  Encounter Date: 08/13/2017      End of Session - 08/13/17 1201    Visit Number 6   Number of Visits 17   Date for SLP Re-Evaluation 09/05/17   Authorization Type [employer]    Authorization - Visit Number 6   Authorization - Number of Visits 24   SLP Start Time 1101   SLP Stop Time  1143   SLP Time Calculation (min) 42 min   Activity Tolerance Patient tolerated treatment well      Past Medical History:  Diagnosis Date  . Hypertension   . Stroke [facility])     Past Surgical History:  Procedure Laterality Date  . ENDARTERECTOMY Left 04/08/2017   Procedure: ENDARTERECTOMY CAROTID;  Surgeon: Waynetta Sandy, MD;  Location: [place];  Service: Vascular;  Laterality: Left;  . PATCH ANGIOPLASTY Left 04/08/2017   Procedure: PATCH ANGIOPLASTY Left Carotid;  Surgeon: Waynetta Sandy, MD;  Location: [place];  Service: Vascular;  Laterality: Left;    There were no vitals filed for this visit.      Subjective Assessment - 08/13/17 1104    Subjective "I'm doing OK"   Currently in Pain? No/denies               ADULT SLP TREATMENT - 08/13/17 1106      General Information   Behavior/Cognition Alert;Cooperative;Pleasant mood     Treatment Provided   Treatment provided Cognitive-Linquistic     Pain Assessment   Pain Assessment No/denies pain     Cognitive-Linquistic Treatment   Treatment focused on Cognition   Skilled Treatment Pt reports continued success managing her medication utilizing medication organizer, and reports her daughter rarely having to remind her to take meds. She reports managing her finances along side of her daughter due to her  inablity to write checks. Pt states she opens her bills, organizes them and pays them  with her daughter 1x a month. She is still dependent on her daughter for her scheudle management. Instructed pt to look the calendar at home each am and pm to know what appointments she has. Also instructred her to keep her therapy calendar in her folder.   Simple money problem solving making change with coins with consisitent written cues and usual mod A for error awareness. Alternate attention between 3 piles of card sort, each pile with a different rule - pt required consistent visual cues to attend to 3 rules, as well as frequent verbal cues to alternate attention between 3 piles and rules. Overall, slow processing requiring extended time for all tasks today.      Assessment / Recommendations / Plan   Plan Continue with current plan of care     Progression Toward Goals   Progression toward goals Progressing toward goals          SLP Education - 08/13/17 1153    Education provided Yes   Education Details progress toward independent management of meds and finanaces, continue to work on being indpendent with schedule management.   Person(s) Educated Patient   Methods Explanation;Demonstration;Verbal cues;Handout   Comprehension Verbalized understanding;Returned demonstration;Need further instruction          SLP Short Term Goals - 08/13/17 1159  SLP SHORT TERM GOAL #1   Title Pt will selectively attend to simple cogntive linguistic problem solving task in mildly distracting environment with 90% accuracy and occasional min A   Time 1   Period Weeks   Status On-going     SLP SHORT TERM GOAL #2   Title Pt will utilize external aids to manage schedule, medications, and be oriented with occasional min A over 3 sessions   Time 2   Period Weeks   Status On-going     SLP SHORT TERM GOAL #3   Title Pt will solve simple functional time/money problems with 85% accuracy and occasional min A   Time 1    Period Weeks   Status On-going     SLP SHORT TERM GOAL #4   Title Pt will follow  3 step detailed instructions/questions on cognitive linguistic tasks with less than 3 requests for repeat out of 5 trials.    Time 1   Period Weeks   Status On-going          SLP Long Term Goals - 08/13/17 1200      SLP LONG TERM GOAL #1   Title Pt will utlize external aids to manage schedule, meds, and finances with occasional min A over 2 sessionsl    Time 4   Period Weeks   Status On-going     SLP LONG TERM GOAL #2   Title Pt will alternate attention between 2 mildly complex cognitive linguistic tasks with 85% on each and occasional min A   Time 4   Period Weeks   Status On-going     SLP LONG TERM GOAL #3   Title Pt will solve mildly complex reasoning, organization, time/money problems with 85% accuracy and occasional min A.   Time 4   Period Weeks   Status On-going          Plan - 08/13/17 1155    Clinical Impression Statement Pt continues to demonstrate slow processing and requiring mod A for attention to detail, error awareness and alternating attention. Pt continues to required instruction to keep a calendar at home, in her therapy folder for independence of schedule management.Continue skilled ST to maximize cognition for possilbe return to living alone.    Speech Therapy Frequency 2x / week   Treatment/Interventions Compensatory strategies;Functional tasks;Patient/family education;Cognitive reorganization;Environmental controls;Internal/external aids;SLP instruction and feedback;Language facilitation   Potential to Achieve Goals Good      Patient will benefit from skilled therapeutic intervention in order to improve the following deficits and impairments:   Cognitive communication deficit    Problem List Patient Active Problem List   Diagnosis Date Noted  . Glucose intolerance (impaired glucose tolerance) 05/01/2017  . Depression 05/01/2017  . Hypertensive crisis   .  Acute bilat watershed infarction 04/09/2017  . Intracranial vascular stenosis   . Hemiparesis affecting dominant side as late effect of stroke ([place])   . Post-operative pain   . S/P carotid endarterectomy   . Essential hypertension   . Stenosis of left carotid artery   . Hyperlipidemia   . Gait disturbance, post-stroke 04/02/2017  . Right arm weakness   . Slowness and poor responsiveness   . Hypertensive emergency 04/01/2017  . Hypokalemia 04/01/2017  . Hyperglycemia 04/01/2017  . Stroke-like symptoms 04/01/2017  . 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 ([place]) 04/01/2017    Ceaira Ernster, Annye Rusk MS, CCC-SLP 08/13/2017, 12:01 PM  [facility] [address]  [place], [place], [address] Phone: [phone]   Fax:  [phone]   Name: [name] [name] MRN: [id number] Date of Birth: 1946-04-07

## 2017-08-14 ENCOUNTER — Other Ambulatory Visit: Payer: Self-pay | Admitting: Family Medicine

## 2017-08-14 NOTE — Therapy (Signed)
[facility] [address] [place], [place], [address] Phone: [phone]   Fax:  [phone]  Physical Therapy Treatment  Patient Details  Name: [name] [name] MRN: [id number] Date of Birth: May 25, 1946 Referring Provider: Alger Simons, MD  Encounter Date: 08/13/2017      PT End of Session - 08/13/17 1238    Visit Number 8   Number of Visits 14   Date for PT Re-Evaluation 08/28/17   Authorization Type [employer] 14 Visits left for PT   Authorization - Visit Number 8   Authorization - Number of Visits 14   PT Start Time 1150   PT Stop Time 1220   PT Time Calculation (min) 30 min   Equipment Utilized During Treatment Gait belt   Activity Tolerance Patient tolerated treatment well   Behavior During Therapy [facility] for tasks assessed/performed      Past Medical History:  Diagnosis Date  . Hypertension   . Stroke [facility])     Past Surgical History:  Procedure Laterality Date  . ENDARTERECTOMY Left 04/08/2017   Procedure: ENDARTERECTOMY CAROTID;  Surgeon: Waynetta Sandy, MD;  Location: [place];  Service: Vascular;  Laterality: Left;  . PATCH ANGIOPLASTY Left 04/08/2017   Procedure: PATCH ANGIOPLASTY Left Carotid;  Surgeon: Waynetta Sandy, MD;  Location: [place];  Service: Vascular;  Laterality: Left;    Vitals:   08/13/17 1151 08/13/17 1156 08/13/17 1224 08/13/17 1227  BP: (!) 189/81 (!) 191/81 (!) 206/85 (!) 179/79  Pulse: 95           Subjective Assessment - 08/13/17 1151    Subjective The patient notes that she gets tired with extended ambulation.  She is doing HEP and notes that she can do all of the exercises, except for lifting the ball overhead.    Pertinent History *The patient and PT discussed BP and she notes her primary MD recommended she monitor and hold activity is systolic is >696 mmHG.   Patient Stated Goals "To be back to where I was physically."    Currently in Pain? No/denies                          [facility] Adult PT Treatment/Exercise - 08/13/17 1156      Ambulation/Gait   Ambulation/Gait Yes   Ambulation/Gait Assistance 5: Supervision   Ambulation/Gait Assistance Details Tactile cues to increase arm swing, verbal and demo cues for longer stride and heel strike.     Ambulation Distance (Feet) 345 Feet   Assistive device None   Ambulation Surface Level;Indoor   Gait Comments Performed forwards/backwards ambulation with supervision to CGA.  With initial walk and BP check 190/80, BP not rising.  Rested to stretch, then performed gait x 115 feet and sidestepping with CGA.  BP elevated to 206/85.  Patient rested and we determined end of session due to HTN.      Exercises   Exercises Other Exercises   Other Exercises  Seated hamstring stretch right and left 2 reps; Standing heel cord stretch;                 PT Education - 08/13/17 1238    Education provided Yes   Education Details Discussed BP concerns and stroke risk.   Person(s) Educated Patient   Methods Explanation   Comprehension Verbalized understanding          PT Short Term Goals - 08/01/17 1412      PT  SHORT TERM GOAL #1   Title Pt/daughter will be independent with initial HEP in order to indicate improved functional mobility and balance.    Baseline 08/01/17: pt able to perform all current HEP with handouts, no cues needed.    Time --   Period --   Status Achieved     PT SHORT TERM GOAL #2   Title Pt will improve gait speed to 2.30 ft/sec w/ LRAD in order to indicate decreased fall risk.     Baseline 08/01/17: 2.04 ft/sec with no AD, improved just not to goal   Time --   Period --   Status Partially Met     PT SHORT TERM GOAL #3   Title Pt will improve 5TSS to <16 secs w/ single UE support in order to indicate decreased fall risk and improved functional strength.     Baseline 08/01/17: 15.53 sec's today with single UE support   Time --   Period --   Status Achieved      PT SHORT TERM GOAL #4   Title Will assess DGI and write appropriate LTG in order to better assess balance.    Baseline 07/16/17: met today with PT to set goal.   Status Achieved     PT SHORT TERM GOAL #5   Title Pt will negotiate up/down 16 stairs with B rails at mod I level in order to indicate safety getting to upstairs bathroom.     Baseline 08/01/17: met today   Time --   Period --   Status Achieved           PT Long Term Goals - 07/10/17 2010      PT LONG TERM GOAL #1   Title Pt/daughter will be independent with final HEP in order to indicate improved functional mobility and decreased fall risk.     Time 7   Period Weeks   Status New   Target Date 08/28/17     PT LONG TERM GOAL #2   Title Pt will improve 5TSS to <15 secs without UE support in order to indicate decreased fall risk and improved functional strength.     Time 7   Period Weeks   Status New   Target Date 08/28/17     PT LONG TERM GOAL #3   Title Pt will demonstrate ability to step into/out of tub with light UE support (on wall) at mod I level in order to indicate safe return to home.    Time 7   Period Weeks   Status New   Target Date 08/28/17     PT LONG TERM GOAL #4   Title Pt will improve gait speed to >/=2.62 ft/sec in order to indicate decreased fall risk and improved efficiency of gait.     Time 7   Period Weeks   Status New   Target Date 08/28/17     PT LONG TERM GOAL #5   Title Pt will ambulate up to 500' over unlevel paved surfaces (including ramp/curb) at distant S level (due to cognition) in order to indicate safe return to community.    Time 7   Period Weeks   Status New   Target Date 08/28/17               Plan - 08/13/17 1225    Clinical Impression Statement Today's session hindered by HTN.  Patient did report her primary care MD is aware of elevated measurements.  Her baseline at home this morning was  higher than typical readings.  She notes primary care MD provided her  guidance to not perform activity if >703 mm HG on systolic.  PT proceeded with session limiting prolonged activity with frequent rests.  INitially, BP was remaining the same, but then began to rise.  Recommended we end session when it increased.   PT Treatment/Interventions ADLs/Self Care Home Management;DME Instruction;Gait training;Stair training;Functional mobility training;Therapeutic activities;Therapeutic exercise;Balance training;Neuromuscular re-education;Cognitive remediation;Patient/family education;Orthotic Fit/Training;Energy conservation;Vestibular   PT Next Visit Plan Check BP each visit; continue to work on LE strengthening and balance. compliant surface activities   Consulted and Agree with Plan of Care Patient      Patient will benefit from skilled therapeutic intervention in order to improve the following deficits and impairments:  Abnormal gait, Decreased activity tolerance, Decreased balance, Decreased cognition, Decreased endurance, Decreased mobility, Decreased strength, Impaired perceived functional ability, Impaired flexibility, Impaired sensation, Improper body mechanics  Visit Diagnosis: Muscle weakness (generalized)  Unsteadiness on feet  Other abnormalities of gait and mobility     Problem List Patient Active Problem List   Diagnosis Date Noted  . Glucose intolerance (impaired glucose tolerance) 05/01/2017  . Depression 05/01/2017  . Hypertensive crisis   . Acute bilat watershed infarction 04/09/2017  . Intracranial vascular stenosis   . Hemiparesis affecting dominant side as late effect of stroke ([place])   . Post-operative pain   . S/P carotid endarterectomy   . Essential hypertension   . Stenosis of left carotid artery   . Hyperlipidemia   . Gait disturbance, post-stroke 04/02/2017  . Right arm weakness   . Slowness and poor responsiveness   . Hypertensive emergency 04/01/2017  . Hypokalemia 04/01/2017  . Hyperglycemia 04/01/2017  . Stroke-like  symptoms 04/01/2017  . 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 ([place]) 04/01/2017    Torii Royse, PT 08/14/2017, 12:20 PM  [facility] [address] [place], [place], [address] Phone: [phone]   Fax:  [phone]  Name: [name] [name] MRN: [id number] Date of Birth: 08/07/1946

## 2017-08-15 ENCOUNTER — Telehealth: Payer: Self-pay | Admitting: Family Medicine

## 2017-08-15 ENCOUNTER — Ambulatory Visit: Payer: BLUE CROSS/BLUE SHIELD | Admitting: Physical Therapy

## 2017-08-15 ENCOUNTER — Encounter: Payer: Self-pay | Admitting: Physical Therapy

## 2017-08-15 VITALS — BP 179/75 | HR 92

## 2017-08-15 DIAGNOSIS — I69351 Hemiplegia and hemiparesis following cerebral infarction affecting right dominant side: Secondary | ICD-10-CM

## 2017-08-15 DIAGNOSIS — R41841 Cognitive communication deficit: Secondary | ICD-10-CM | POA: Diagnosis not present

## 2017-08-15 DIAGNOSIS — R2681 Unsteadiness on feet: Secondary | ICD-10-CM

## 2017-08-15 DIAGNOSIS — M6281 Muscle weakness (generalized): Secondary | ICD-10-CM

## 2017-08-15 DIAGNOSIS — R2689 Other abnormalities of gait and mobility: Secondary | ICD-10-CM

## 2017-08-15 NOTE — Telephone Encounter (Signed)
10/5 staff msg to contact pt for OV needed for any future refills-- pt already had  A 10/24 appt schedudled. --glh

## 2017-08-17 ENCOUNTER — Other Ambulatory Visit: Payer: Self-pay | Admitting: Physical Medicine & Rehabilitation

## 2017-08-17 NOTE — Progress Notes (Signed)
   08/15/17 1509  Balance Exercises: Standing  Rockerboard Anterior/posterior;Lateral;Head turns;EO;EC;20 seconds;10 reps;Intermittent UE support  Balance Beam standing across blue foam balance beam:   alternating fwd stepping to floor and back onto foam x 10 each leg, then alternating bwd stepping to floor and back onto foam beam, min guard to min assist for balance with cues   Balance Exercises: Standing  Rebounder Limitations performed both ways on balance board with little to no UE support: holding board steady- EO alternating UE raises, progressing to bil UE raises with min gaurd to min assist for balance. EC no head movements, progressing to EC head movements left<>right, then up<>down, min assist with cues on posture/weight shifting to assist with balance.

## 2017-08-17 NOTE — Therapy (Signed)
[facility] [address] [place], [place], [address] Phone: [phone]   Fax:  [phone]  Physical Therapy Treatment  Patient Details  Name: [name] [name] MRN: [id number] Date of Birth: Mar 03, 1946 Referring Provider: Alger Simons, MD  Encounter Date: 08/15/2017   08/15/17 1452  PT Visits / Re-Eval  Visit Number 9  Number of Visits 14  Date for PT Re-Evaluation 08/28/17  Authorization  Authorization Type BCBS 14 Visits left for PT  Authorization - Visit Number 9  Authorization - Number of Visits 14  PT Time Calculation  PT Start Time 9371  PT Stop Time 1530  PT Time Calculation (min) 41 min  PT - End of Session  Equipment Utilized During Treatment Gait belt  Activity Tolerance Patient tolerated treatment well  Behavior During Therapy [facility] for tasks assessed/performed     Past Medical History:  Diagnosis Date  . Hypertension   . Stroke [facility])     Past Surgical History:  Procedure Laterality Date  . ENDARTERECTOMY Left 04/08/2017   Procedure: ENDARTERECTOMY CAROTID;  Surgeon: Waynetta Sandy, MD;  Location: [place];  Service: Vascular;  Laterality: Left;  . PATCH ANGIOPLASTY Left 04/08/2017   Procedure: PATCH ANGIOPLASTY Left Carotid;  Surgeon: Waynetta Sandy, MD;  Location: [place];  Service: Vascular;  Laterality: Left;    Vitals:   08/15/17 1451 08/15/17 1508  BP: (!) 160/75 (!) 179/75  Pulse:  92       08/15/17 1451  Symptoms/Limitations  Subjective No new complaints. No falls or pain to report. Unsure of what her BP was at home today.   Pertinent History *The patient and PT discussed BP and she notes her primary MD recommended she monitor and hold activity is systolic is >696 mmHG.  Limitations House hold activities;Walking  Patient Stated Goals "To be back to where I was physically."   Pain Assessment  Currently in Pain? No/denies  Pain Score 0      08/15/17 1456   Ambulation/Gait  Ambulation/Gait Yes  Ambulation/Gait Assistance 5: Supervision  Ambulation/Gait Assistance Details cues for incr reciprocal arm swing and incr step length with gait  Ambulation Distance (Feet) (around gym)  Assistive device None  Gait Pattern Step-through pattern;Decreased stride length;Decreased arm swing - right;Decreased arm swing - left;Shuffle;Trunk flexed;Poor foot clearance - right  Ambulation Surface Level;Indoor  High Level Balance  High Level Balance Activities Marching forwards;Marching backwards;Tandem walking (tandem/toe walk fwd/bwd)  High Level Balance Comments on red mats next to counter top with single UE support as needed for balance. performed each one for 3 laps each way with min guard to min assist for balance, cues on posture and ex form.        08/15/17 1509  Balance Exercises: Standing  Rockerboard Anterior/posterior;Lateral;Head turns;EO;EC;20 seconds;10 reps;Intermittent UE support  Balance Beam standing across blue foam balance beam:   alternating fwd stepping to floor and back onto foam x 10 each leg, then alternating bwd stepping to floor and back onto foam beam, min guard to min assist for balance with cues   Balance Exercises: Standing  Rebounder Limitations performed both ways on balance board with little to no UE support: holding board steady- EO alternating UE raises, progressing to bil UE raises with min gaurd to min assist for balance. EC no head movements, progressing to EC head movements left<>right, then up<>down, min assist with cues on posture/weight shifting to assist with balance.  PT Short Term Goals - 08/01/17 1412      PT SHORT TERM GOAL #1   Title Pt/daughter will be independent with initial HEP in order to indicate improved functional mobility and balance.    Baseline 08/01/17: pt able to perform all current HEP with handouts, no cues needed.    Time --   Period --   Status Achieved      PT SHORT TERM GOAL #2   Title Pt will improve gait speed to 2.30 ft/sec w/ LRAD in order to indicate decreased fall risk.     Baseline 08/01/17: 2.04 ft/sec with no AD, improved just not to goal   Time --   Period --   Status Partially Met     PT SHORT TERM GOAL #3   Title Pt will improve 5TSS to <16 secs w/ single UE support in order to indicate decreased fall risk and improved functional strength.     Baseline 08/01/17: 15.53 sec's today with single UE support   Time --   Period --   Status Achieved     PT SHORT TERM GOAL #4   Title Will assess DGI and write appropriate LTG in order to better assess balance.    Baseline 07/16/17: met today with PT to set goal.   Status Achieved     PT SHORT TERM GOAL #5   Title Pt will negotiate up/down 16 stairs with B rails at mod I level in order to indicate safety getting to upstairs bathroom.     Baseline 08/01/17: met today   Time --   Period --   Status Achieved           PT Long Term Goals - 07/10/17 2010      PT LONG TERM GOAL #1   Title Pt/daughter will be independent with final HEP in order to indicate improved functional mobility and decreased fall risk.     Time 7   Period Weeks   Status New   Target Date 08/28/17     PT LONG TERM GOAL #2   Title Pt will improve 5TSS to <15 secs without UE support in order to indicate decreased fall risk and improved functional strength.     Time 7   Period Weeks   Status New   Target Date 08/28/17     PT LONG TERM GOAL #3   Title Pt will demonstrate ability to step into/out of tub with light UE support (on wall) at mod I level in order to indicate safe return to home.    Time 7   Period Weeks   Status New   Target Date 08/28/17     PT LONG TERM GOAL #4   Title Pt will improve gait speed to >/=2.62 ft/sec in order to indicate decreased fall risk and improved efficiency of gait.     Time 7   Period Weeks   Status New   Target Date 08/28/17     PT LONG TERM GOAL #5   Title Pt will  ambulate up to 500' over unlevel paved surfaces (including ramp/curb) at distant S level (due to cognition) in order to indicate safe return to community.    Time 7   Period Weeks   Status New   Target Date 08/28/17        08/15/17 1452  Plan  Clinical Impression Statement Today's skilled session focused on high level balance activities without any issues reported. Pt demo'd increased actiity tolerance with improved BP  readings today (170/77 at end of session). Pt is progressing toward goals and should benefit from continued PT to progress toward unmet goals.                              Pt will benefit from skilled therapeutic intervention in order to improve on the following deficits Abnormal gait;Decreased activity tolerance;Decreased balance;Decreased cognition;Decreased endurance;Decreased mobility;Decreased strength;Impaired perceived functional ability;Impaired flexibility;Impaired sensation;Improper body mechanics  PT Treatment/Interventions ADLs/Self Care Home Management;DME Instruction;Gait training;Stair training;Functional mobility training;Therapeutic activities;Therapeutic exercise;Balance training;Neuromuscular re-education;Cognitive remediation;Patient/family education;Orthotic Fit/Training;Energy conservation;Vestibular  PT Next Visit Plan Check BP each visit; continue to work on LE strengthening and balance. compliant surface activities  Consulted and Agree with Plan of Care Patient     Patient will benefit from skilled therapeutic intervention in order to improve the following deficits and impairments:  Abnormal gait, Decreased activity tolerance, Decreased balance, Decreased cognition, Decreased endurance, Decreased mobility, Decreased strength, Impaired perceived functional ability, Impaired flexibility, Impaired sensation, Improper body mechanics  Visit Diagnosis: Muscle weakness (generalized)  Unsteadiness on feet  Hemiplegia and hemiparesis following cerebral  infarction affecting right dominant side (HCC)  Other abnormalities of gait and mobility     Problem List Patient Active Problem List   Diagnosis Date Noted  . Glucose intolerance (impaired glucose tolerance) 05/01/2017  . Depression 05/01/2017  . Hypertensive crisis   . Acute bilat watershed infarction 04/09/2017  . Intracranial vascular stenosis   . Hemiparesis affecting dominant side as late effect of stroke ([place])   . Post-operative pain   . S/P carotid endarterectomy   . Essential hypertension   . Stenosis of left carotid artery   . Hyperlipidemia   . Gait disturbance, post-stroke 04/02/2017  . Right arm weakness   . Slowness and poor responsiveness   . Hypertensive emergency 04/01/2017  . Hypokalemia 04/01/2017  . Hyperglycemia 04/01/2017  . Stroke-like symptoms 04/01/2017  . 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 ([place]) 04/01/2017    Willow Ora, PTA, [facility] [address], [place], [place] [address] [phone] 08/17/17, 10:04 PM   Name: [name] [name] MRN: [id number] Date of Birth: 11/15/1945

## 2017-08-18 ENCOUNTER — Other Ambulatory Visit: Payer: Self-pay | Admitting: Physical Medicine & Rehabilitation

## 2017-08-18 NOTE — Telephone Encounter (Signed)
Pt daughter called to inform that she didn't hear from anyone re: the letter but since being made aware that it is up front she will be here no later than tomorrow 10-09 for it.  No call back requested

## 2017-08-20 ENCOUNTER — Ambulatory Visit: Payer: BLUE CROSS/BLUE SHIELD | Admitting: Occupational Therapy

## 2017-08-20 ENCOUNTER — Ambulatory Visit: Payer: BLUE CROSS/BLUE SHIELD | Admitting: Physical Therapy

## 2017-08-20 ENCOUNTER — Ambulatory Visit: Payer: BLUE CROSS/BLUE SHIELD | Admitting: Speech Pathology

## 2017-08-20 ENCOUNTER — Encounter: Payer: Self-pay | Admitting: Physical Therapy

## 2017-08-20 VITALS — BP 161/83 | HR 97

## 2017-08-20 DIAGNOSIS — I69351 Hemiplegia and hemiparesis following cerebral infarction affecting right dominant side: Secondary | ICD-10-CM

## 2017-08-20 DIAGNOSIS — R2681 Unsteadiness on feet: Secondary | ICD-10-CM

## 2017-08-20 DIAGNOSIS — I69318 Other symptoms and signs involving cognitive functions following cerebral infarction: Secondary | ICD-10-CM

## 2017-08-20 DIAGNOSIS — R2689 Other abnormalities of gait and mobility: Secondary | ICD-10-CM

## 2017-08-20 DIAGNOSIS — R293 Abnormal posture: Secondary | ICD-10-CM

## 2017-08-20 DIAGNOSIS — M6281 Muscle weakness (generalized): Secondary | ICD-10-CM

## 2017-08-20 DIAGNOSIS — R41841 Cognitive communication deficit: Secondary | ICD-10-CM | POA: Diagnosis not present

## 2017-08-20 NOTE — Therapy (Signed)
[facility] [address] [place], [place], [address] Phone: [phone]   Fax:  [phone]  Physical Therapy Treatment  Patient Details  Name: [name] [name] MRN: [id number] Date of Birth: [birth year] Referring Provider: Alger Simons, MD  Encounter Date: 08/20/2017      PT End of Session - 08/20/17 1455    Visit Number 10   Number of Visits 14   Date for PT Re-Evaluation 08/28/17   Authorization Type BCBS 14 Visits left for PT   Authorization - Visit Number 10   Authorization - Number of Visits 14   PT Start Time 1517   PT Stop Time 1403   PT Time Calculation (min) 42 min   Equipment Utilized During Treatment Gait belt   Activity Tolerance Patient tolerated treatment well   Behavior During Therapy WFL for tasks assessed/performed      Past Medical History:  Diagnosis Date  . Hypertension   . Stroke [facility])     Past Surgical History:  Procedure Laterality Date  . ENDARTERECTOMY Left 04/08/2017   Procedure: ENDARTERECTOMY CAROTID;  Surgeon: Waynetta Sandy, MD;  Location: [place];  Service: Vascular;  Laterality: Left;  . PATCH ANGIOPLASTY Left 04/08/2017   Procedure: PATCH ANGIOPLASTY Left Carotid;  Surgeon: Waynetta Sandy, MD;  Location: [place];  Service: Vascular;  Laterality: Left;    Vitals:   08/20/17 1416  BP: (!) 161/83  Pulse: 97        Subjective Assessment - 08/20/17 1419    Subjective No new complaints. No falls to report. Patient reports having difficutly with HEP, specifically walking on toes and tandem walk   Patient is accompained by: Family member   Pertinent History *The patient and PT discussed BP and she notes her primary MD recommended she monitor and hold activity is systolic is >616 mmHG.   Limitations House hold activities;Walking   Patient Stated Goals "To be back to where I was physically."    Currently in Pain? No/denies            [facility] PT Assessment  - 08/20/17 0001      High Level Balance   High Level Balance Activities Marching forwards;Marching backwards;Other (comment)  toe walking    High Level Balance Comments Red/blue mats next to counter top for UE support as needed. Marching forward/back x3 CGA, toe walking forward/back x4 min guard              Balance Exercises - 08/20/17 1422      Balance Exercises: Standing   Rockerboard Anterior/posterior;EO;EC;30 seconds;Lateral;Head turns;Intermittent UE support   Tandem Gait Forward;Retro;Intermittent upper extremity support;Foam/compliant surface;4 reps;Limitations   Sidestepping Foam/compliant support;3 reps;Limitations  Red/blue mat, countertop if needed      Balance Exercises: Standing   Rebounder Limitations Min assist. Unsteady, intermittent UE support. Lat./ant/post/ lean with directional movements (looking up/down, left/right) while rocking forward/backward/lateral. Patient was very nervous about the rockerboard.    Tandem Gait Limitations Min guard. Red/blue foam, gait unsteady, lateral sway, pt stepping on heels of feet, pt felt nervous about task    Sidestepping Limitations Supervision. Steady gait, no postural sway. Patient felt comfortable with this task, even on compliant surface.              PT Short Term Goals - 08/01/17 1412      PT SHORT TERM GOAL #1   Title Pt/daughter will be independent with initial HEP in order to indicate improved functional mobility and balance.  Baseline 08/01/17: pt able to perform all current HEP with handouts, no cues needed.    Time --   Period --   Status Achieved     PT SHORT TERM GOAL #2   Title Pt will improve gait speed to 2.30 ft/sec w/ LRAD in order to indicate decreased fall risk.     Baseline 08/01/17: 2.04 ft/sec with no AD, improved just not to goal   Time --   Period --   Status Partially Met     PT SHORT TERM GOAL #3   Title Pt will improve 5TSS to <16 secs w/ single UE support in order to indicate  decreased fall risk and improved functional strength.     Baseline 08/01/17: 15.53 sec's today with single UE support   Time --   Period --   Status Achieved     PT SHORT TERM GOAL #4   Title Will assess DGI and write appropriate LTG in order to better assess balance.    Baseline 07/16/17: met today with PT to set goal.   Status Achieved     PT SHORT TERM GOAL #5   Title Pt will negotiate up/down 16 stairs with B rails at mod I level in order to indicate safety getting to upstairs bathroom.     Baseline 08/01/17: met today   Time --   Period --   Status Achieved           PT Long Term Goals - 07/10/17 2010      PT LONG TERM GOAL #1   Title Pt/daughter will be independent with final HEP in order to indicate improved functional mobility and decreased fall risk.     Time 7   Period Weeks   Status New   Target Date 08/28/17     PT LONG TERM GOAL #2   Title Pt will improve 5TSS to <15 secs without UE support in order to indicate decreased fall risk and improved functional strength.     Time 7   Period Weeks   Status New   Target Date 08/28/17     PT LONG TERM GOAL #3   Title Pt will demonstrate ability to step into/out of tub with light UE support (on wall) at mod I level in order to indicate safe return to home.    Time 7   Period Weeks   Status New   Target Date 08/28/17     PT LONG TERM GOAL #4   Title Pt will improve gait speed to >/=2.62 ft/sec in order to indicate decreased fall risk and improved efficiency of gait.     Time 7   Period Weeks   Status New   Target Date 08/28/17     PT LONG TERM GOAL #5   Title Pt will ambulate up to 500' over unlevel paved surfaces (including ramp/curb) at distant S level (due to cognition) in order to indicate safe return to community.    Time 7   Period Weeks   Status New   Target Date 08/28/17               Plan - 08/20/17 1457    Clinical Impression Statement Pt tolerated treatment well, todays session focused on  high level balance activities with supervision to min assist required and some postural sway and unsteadiness with activity. Pt showed decreased in anxiety after performing activities. Patients BP 161/83 halfway throught treatment. Pt progressing towards goals and should benefit from continued PT.  PT Treatment/Interventions ADLs/Self Care Home Management;DME Instruction;Gait training;Stair training;Functional mobility training;Therapeutic activities;Therapeutic exercise;Balance training;Neuromuscular re-education;Cognitive remediation;Patient/family education;Orthotic Fit/Training;Energy conservation;Vestibular   PT Next Visit Plan Check BP each visit; continue to work on LE strengthening and balance. compliant surface activities   Consulted and Agree with Plan of Care Patient      Patient will benefit from skilled therapeutic intervention in order to improve the following deficits and impairments:  Abnormal gait, Decreased activity tolerance, Decreased balance, Decreased cognition, Decreased endurance, Decreased mobility, Decreased strength, Impaired perceived functional ability, Impaired flexibility, Impaired sensation, Improper body mechanics  Visit Diagnosis: Hemiplegia and hemiparesis following cerebral infarction affecting right dominant side (HCC)  Unsteadiness on feet  Other abnormalities of gait and mobility  Muscle weakness (generalized)  Abnormal posture     Problem List Patient Active Problem List   Diagnosis Date Noted  . Glucose intolerance (impaired glucose tolerance) 05/01/2017  . Depression 05/01/2017  . Hypertensive crisis   . Acute bilat watershed infarction 04/09/2017  . Intracranial vascular stenosis   . Hemiparesis affecting dominant side as late effect of stroke ([place])   . Post-operative pain   . S/P carotid endarterectomy   . Essential hypertension   . Stenosis of left carotid artery   . Hyperlipidemia   . Gait disturbance, post-stroke 04/02/2017  .  Right arm weakness   . Slowness and poor responsiveness   . Hypertensive emergency 04/01/2017  . Hypokalemia 04/01/2017  . Hyperglycemia 04/01/2017  . Stroke-like symptoms 04/01/2017  . 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 ([place]) 04/01/2017    Semir Brill, SPTA  08/20/2017, 3:34 PM  [facility] [address] [place], [place], [address] Phone: [phone]   Fax:  [phone]  Name: [name] [name] MRN: [id number] Date of Birth: 1946-03-28

## 2017-08-20 NOTE — Therapy (Signed)
[facility] [address] [place], [place], [address] Phone: [phone]   Fax:  [phone]  Occupational Therapy Treatment  Patient Details  Name: [name] [name] MRN: [id number] Date of Birth: Nov 14, 1945 Referring Provider: Dr. Donetta Potts  Encounter Date: 08/20/2017      OT End of Session - 08/20/17 1446    Visit Number 6   Number of Visits 14   Date for OT Re-Evaluation 08/28/17   Authorization Type BC/BS MCR - G code required   Authorization Time Period At evaluation - 14 visits remaining for OT    Authorization - Visit Number 6   Authorization - Number of Visits 10   OT Start Time 1230   OT Stop Time 1315   OT Time Calculation (min) 45 min   Activity Tolerance Patient tolerated treatment well      Past Medical History:  Diagnosis Date  . Hypertension   . Stroke [facility])     Past Surgical History:  Procedure Laterality Date  . ENDARTERECTOMY Left 04/08/2017   Procedure: ENDARTERECTOMY CAROTID;  Surgeon: Waynetta Sandy, MD;  Location: [place];  Service: Vascular;  Laterality: Left;  . PATCH ANGIOPLASTY Left 04/08/2017   Procedure: PATCH ANGIOPLASTY Left Carotid;  Surgeon: Waynetta Sandy, MD;  Location: [place];  Service: Vascular;  Laterality: Left;    There were no vitals filed for this visit.      Subjective Assessment - 08/20/17 1241    Subjective  My hand is getting better, slowly but gradually getting better   Pertinent History bilateral watershed infarction (Lt side worse than Rt) 04/01/17, Lt carotid endarterectomy 04/08/17, PMH: chronic F[place] vessel dz, h/o smaller strokes   Limitations no driving   Patient Stated Goals return to cooking and get my Rt hand better   Currently in Pain? No/denies                      OT Treatments/Exercises (OP) - 08/20/17 0001      ADLs   Cooking Pt first made PB and jelly sandwich at mod I level - therapist did not need to  assist or provide cueing for this. Pt then asked to make scrambled egg - pt gathered items needed initially and made egg with only min cues for safety over stove and cues to help making flipping egg over easier. Pt required questioning cues/prompts only to gather remaining items (spatula and plate). Pt I'ly turned stove off   Home Maintenance Pt demo washing dishes in clinic occasionally needing to use Lt hand to scrub, but mostly holding dish with Lt hand and washing with Rt hand.                   OT Short Term Goals - 08/20/17 1446      OT SHORT TERM GOAL #1   Title Independent with coordination and putty HEP    Time 3   Period Weeks   Status Achieved     OT SHORT TERM GOAL #2   Title Pt to tie shoes or able to wear tie up shoes with A/E prn   Time 3   Period Weeks   Status Achieved  per pt report     OT SHORT TERM GOAL #3   Title pt to safely perform tub/shower transfer with DME prn   Time 3   Period Weeks   Status On-going  Pt is performing with supervision     OT SHORT  TERM GOAL #4   Title Pt to perform simple cold meal, microwaveable meals, and snack prep mod I level consistently    Time 3   Period Weeks   Status Achieved     OT SHORT TERM GOAL #5   Title Pt to consistently participate in light household tasks (folding clothes, washing dishes)   Time 3   Period Weeks   Status Achieved  folds clothes at home. Washed dishes in clinic           OT Long Term Goals - 08/20/17 1448      OT LONG TERM GOAL #1   Title Independent with updated HEP    Time 7   Period Weeks   Status New     OT LONG TERM GOAL #2   Title Pt to improve coordination Rt hand as evidenced by reducing speed on 9 hole peg test to 38 sec. or under   Baseline eval: 50 sec   Time 7   Period Weeks   Status New     OT LONG TERM GOAL #3   Title Pt to improve grip strength Rt dominant hand to 25 lbs or greater to assist with opening jars/containers   Baseline eval: 10 lbs (Lt = 58  lbs)    Time 7   Period Weeks   Status New     OT LONG TERM GOAL #4   Title Pt to retrieve/replace 2 lb. object from high level shelf RUE with no reports of pain   Time 7   Period Weeks   Status New     OT LONG TERM GOAL #5   Title Pt to make simple meal mod I level safely   Time 7   Period Weeks   Status On-going  currently at sup level with min cues     OT LONG TERM GOAL #6   Title Pt/family to verbalize understanding with necessary DME, modifications, and external aids/devices to put in place if pt were to return to living alone   Time 7   Period Weeks   Status New               Plan - 08/20/17 1448    Clinical Impression Statement Pt has met most and progressing towards STG's. 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 Continue functional use and reaching RUE   Consulted and Agree with Plan of Care Patient      Patient will benefit from skilled therapeutic intervention in order to improve the following deficits and impairments:  Decreased coordination, Decreased range of motion, Impaired flexibility, Decreased safety awareness, Decreased endurance, Impaired sensation, Decreased knowledge of precautions, Impaired UE functional use, Pain, Decreased mobility, Decreased strength, Decreased cognition, Impaired vision/preception  Visit  Diagnosis: Hemiplegia and hemiparesis following cerebral infarction affecting right dominant side (HCC)  Unsteadiness on feet  Other symptoms and signs involving cognitive functions following cerebral infarction    Problem List Patient Active Problem List   Diagnosis Date Noted  . Glucose intolerance (impaired glucose tolerance) 05/01/2017  .  Depression 05/01/2017  . Hypertensive crisis   . Acute bilat watershed infarction 04/09/2017  . Intracranial vascular stenosis   . Hemiparesis affecting dominant side as late effect of stroke ([place])   . Post-operative pain   . S/P carotid endarterectomy   . Essential hypertension   . Stenosis of left carotid artery   . Hyperlipidemia   . Gait disturbance, post-stroke 04/02/2017  . Right arm weakness   . Slowness and poor responsiveness   . Hypertensive emergency 04/01/2017  . Hypokalemia 04/01/2017  . Hyperglycemia 04/01/2017  . Stroke-like symptoms 04/01/2017  . 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 ([place]) 04/01/2017    Carey Bullocks, OTR/L 08/20/2017, 2:50 PM  [facility] [address] [place], [place], [address] Phone: [phone]   Fax:  [phone]  Name: [name] [name] MRN: [id number] Date of Birth: [birth year]

## 2017-08-20 NOTE — Therapy (Signed)
[facility] [address] [place], [place], [address] Phone: [phone]   Fax:  [phone]  Speech Language Pathology Treatment  Patient Details  Name: [name] [name] MRN: [id number] Date of Birth: 06-25-1946 Referring Provider: Dr. Alger Simons  Encounter Date: 08/20/2017      End of Session - 08/20/17 1812    Visit Number 7   Number of Visits 17   Date for SLP Re-Evaluation 09/05/17   Authorization Type [employer] medicare    Authorization - Visit Number 7   Authorization - Number of Visits 24   SLP Start Time 3818   SLP Stop Time  1445   SLP Time Calculation (min) 42 min   Activity Tolerance Patient tolerated treatment well      Past Medical History:  Diagnosis Date  . Hypertension   . Stroke [facility])     Past Surgical History:  Procedure Laterality Date  . ENDARTERECTOMY Left 04/08/2017   Procedure: ENDARTERECTOMY CAROTID;  Surgeon: Waynetta Sandy, MD;  Location: [facility];  Service: Vascular;  Laterality: Left;  . PATCH ANGIOPLASTY Left 04/08/2017   Procedure: PATCH ANGIOPLASTY Left Carotid;  Surgeon: Waynetta Sandy, MD;  Location: [facility];  Service: Vascular;  Laterality: Left;    There were no vitals filed for this visit.      Subjective Assessment - 08/20/17 1407    Subjective "The lady next door." re: clinician seen previous session   Currently in Pain? No/denies               ADULT SLP TREATMENT - 08/20/17 1408      General Information   Behavior/Cognition Alert;Cooperative;Pleasant mood   Patient Positioning Upright in chair   Oral care provided N/A     Treatment Provided   Treatment provided Cognitive-Linquistic     Pain Assessment   Pain Assessment No/denies pain     Cognitive-Linquistic Treatment   Treatment focused on Cognition   Skilled Treatment Pt required verbal cues to retrieve home exercises; she reports she continues to rely on her daughter for schedule  management and has not placed her therapy calendar in her folder as previous clinician suggested. SLP reviewed home tasks with pt; she required rare min A to identify and and correct errors (90% accurate). Targeted simple problem solving; pt today with 90% accuracy and rare min A in mildly distracting environment.      Assessment / Recommendations / Plan   Plan Continue with current plan of care     Progression Toward Goals   Progression toward goals Progressing toward goals          SLP Education - 08/20/17 1810    Education provided Yes   Education Details keep therapy calendar in folder and check daily   Person(s) Educated Patient   Methods Explanation   Comprehension Verbalized understanding          SLP Short Term Goals - 08/20/17 1413      SLP SHORT TERM GOAL #1   Title Pt will selectively attend to simple cogntive linguistic problem solving task in mildly distracting environment with 90% accuracy and occasional min A   Time 1   Period Weeks   Status Achieved     SLP SHORT TERM GOAL #2   Title Pt will utilize external aids to manage schedule, medications, and be oriented with occasional min A over 3 sessions   Time 1   Status Partially Met     SLP SHORT TERM GOAL #  3   Title Pt will solve simple functional time/money problems with 85% accuracy and occasional min A   Time 1   Period Weeks   Status Achieved     SLP SHORT TERM GOAL #4   Title Pt will follow 3 step detailed instructions/questions on cognitive linguistic tasks with less than 3 requests for repeat out of 5 trials.    Time 1   Period Weeks   Status Not Met          SLP Long Term Goals - 08/20/17 1815      SLP LONG TERM GOAL #1   Title Pt will utlize external aids to manage schedule, meds, and finances with occasional min A over 2 sessionsl    Time 4   Period Weeks   Status On-going     SLP LONG TERM GOAL #2   Title Pt will alternate attention between 2 mildly complex cognitive linguistic  tasks with 85% on each and occasional min A   Time 4   Period Weeks   Status On-going     SLP LONG TERM GOAL #3   Title Pt will solve mildly complex reasoning, organization, time/money problems with 85% accuracy and occasional min A.   Time 4   Period Weeks   Status On-going          Plan - 08/20/17 1812    Clinical Impression Statement Pt continues to require extended time for all tasks, though today she demo'd WFL alternating attention in a mildly distracting environment and checked her work for errors with rare min A. Pt continues to required instruction to keep a calendar at home, in her therapy folder for independence of schedule management. Continue skilled ST to maximize cognition for possible return to living alone.    Speech Therapy Frequency 2x / week   Treatment/Interventions Compensatory strategies;Functional tasks;Patient/family education;Cognitive reorganization;Environmental controls;Internal/external aids;SLP instruction and feedback;Language facilitation   Potential to Achieve Goals Good   SLP Home Exercise Plan reviewed/provided   Consulted and Agree with Plan of Care Patient      Patient will benefit from skilled therapeutic intervention in order to improve the following deficits and impairments:   Cognitive communication deficit    Problem List Patient Active Problem List   Diagnosis Date Noted  . Glucose intolerance (impaired glucose tolerance) 05/01/2017  . Depression 05/01/2017  . Hypertensive crisis   . Acute bilat watershed infarction 04/09/2017  . Intracranial vascular stenosis   . Hemiparesis affecting dominant side as late effect of stroke ([place])   . Post-operative pain   . S/P carotid endarterectomy   . Essential hypertension   . Stenosis of left carotid artery   . Hyperlipidemia   . Gait disturbance, post-stroke 04/02/2017  . Right arm weakness   . Slowness and poor responsiveness   . Hypertensive emergency 04/01/2017  . Hypokalemia  04/01/2017  . Hyperglycemia 04/01/2017  . Stroke-like symptoms 04/01/2017  . 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 ([place]) 04/01/2017   Deneise Lever, [place], [place] 08/20/2017, 6:17 PM  [facility] [address] [place], [place], [address] Phone: [phone]   Fax:  [phone]   Name: Dealie Koelzer MRN: [id number] Date of Birth: 08-Jul-1946

## 2017-08-21 ENCOUNTER — Ambulatory Visit: Payer: BLUE CROSS/BLUE SHIELD | Admitting: Occupational Therapy

## 2017-08-21 ENCOUNTER — Other Ambulatory Visit: Payer: Self-pay | Admitting: Physical Medicine & Rehabilitation

## 2017-08-21 ENCOUNTER — Ambulatory Visit: Payer: BLUE CROSS/BLUE SHIELD | Admitting: Speech Pathology

## 2017-08-21 ENCOUNTER — Ambulatory Visit: Payer: BLUE CROSS/BLUE SHIELD

## 2017-08-26 ENCOUNTER — Ambulatory Visit: Payer: BLUE CROSS/BLUE SHIELD | Admitting: Occupational Therapy

## 2017-08-26 ENCOUNTER — Ambulatory Visit: Payer: BLUE CROSS/BLUE SHIELD | Admitting: Physical Therapy

## 2017-08-26 ENCOUNTER — Encounter: Payer: Self-pay | Admitting: Physical Therapy

## 2017-08-26 ENCOUNTER — Encounter: Payer: Self-pay | Admitting: Occupational Therapy

## 2017-08-26 ENCOUNTER — Ambulatory Visit: Payer: BLUE CROSS/BLUE SHIELD | Admitting: Speech Pathology

## 2017-08-26 VITALS — BP 164/78 | HR 89

## 2017-08-26 DIAGNOSIS — R41841 Cognitive communication deficit: Secondary | ICD-10-CM

## 2017-08-26 DIAGNOSIS — R2689 Other abnormalities of gait and mobility: Secondary | ICD-10-CM

## 2017-08-26 DIAGNOSIS — R293 Abnormal posture: Secondary | ICD-10-CM

## 2017-08-26 DIAGNOSIS — M25511 Pain in right shoulder: Secondary | ICD-10-CM

## 2017-08-26 DIAGNOSIS — I69351 Hemiplegia and hemiparesis following cerebral infarction affecting right dominant side: Secondary | ICD-10-CM

## 2017-08-26 DIAGNOSIS — I69318 Other symptoms and signs involving cognitive functions following cerebral infarction: Secondary | ICD-10-CM

## 2017-08-26 DIAGNOSIS — R2681 Unsteadiness on feet: Secondary | ICD-10-CM

## 2017-08-26 DIAGNOSIS — R278 Other lack of coordination: Secondary | ICD-10-CM

## 2017-08-26 DIAGNOSIS — R482 Apraxia: Secondary | ICD-10-CM

## 2017-08-26 DIAGNOSIS — M6281 Muscle weakness (generalized): Secondary | ICD-10-CM

## 2017-08-26 NOTE — Patient Instructions (Signed)
Perform these at home next to kitchen counter top or sturdy table for balance support:    "I love a Parade" Lift- continue to hold counter with this one   At counter for balance as needed: high knee marching forward and then backward. 3 second pauses with each knee lift.  Repeat 3 laps each way. Do _1-2_ sessions per day. http://gt2.exer.us/345   Copyright  VHI. All rights reserved.  Walking on Toes- no holding with this one    Walk on toes forward while continuing on a straight path, and then backwards on toes to starting position. Repeat 3 laps each way. Do _1-2___ sessions per day.  Copyright  VHI. All rights reserved.  Feet Heel-Toe "Tandem"- no holding with this one   Arms at sides, walk a straight line forward bringing one foot directly in front of the other, and then a straight line backwards bringing one foot directly behind the other one.  Repeat for _3 laps each way. Do _1-2_ sessions per day.  Copyright  VHI. All rights reserved.   Perform these in a corner with a chair in front of you for safety:- HOVER YOUR HANDS OVER CHAIR OR HAVE AT SIDES TO FURTHER CHALLENGE YOUR BALANCE Feet Together (Compliant Surface) Varied Arm Positions - Eyes Closed    Stand on compliant surface: pillow/s with feet together and arms at sides. Close eyes and visualize upright position. Hold_30_ seconds. Repeat _3_ times per session. Do _1-2_ sessions per day.  Copyright  VHI. All rights reserved.    Feet Together (Compliant Surface) Head Motion - Eyes Closed    Stand on compliant surface: pillow/s with feet together. Close eyes and move head slowly: 1. Up and down x 10 reps 2. Left and right x 10 reps  Do _1-2_ sessions per day.  Copyright  VHI. All rights reserved.

## 2017-08-26 NOTE — Therapy (Signed)
[facility] [address] [place], [place], [address] Phone: [phone]   Fax:  [phone]  Speech Language Pathology Treatment  Patient Details  Name: [name] [name] MRN: [id number] Date of Birth: [birth year] Referring Provider: Dr. Alger Simons  Encounter Date: 08/26/2017      End of Session - 08/26/17 1514    Visit Number 8   Number of Visits 17   Date for SLP Re-Evaluation 09/05/17   Authorization Type Blue medicare    Authorization - Visit Number 8   Authorization - Number of Visits 24   SLP Start Time 2353   SLP Stop Time  1358   SLP Time Calculation (min) 42 min   Activity Tolerance Patient tolerated treatment well      Past Medical History:  Diagnosis Date  . Hypertension   . Stroke [facility])     Past Surgical History:  Procedure Laterality Date  . ENDARTERECTOMY Left 04/08/2017   Procedure: ENDARTERECTOMY CAROTID;  Surgeon: Waynetta Sandy, MD;  Location: [facility];  Service: Vascular;  Laterality: Left;  . PATCH ANGIOPLASTY Left 04/08/2017   Procedure: PATCH ANGIOPLASTY Left Carotid;  Surgeon: Waynetta Sandy, MD;  Location: [facility];  Service: Vascular;  Laterality: Left;    There were no vitals filed for this visit.      Subjective Assessment - 08/26/17 1328    Subjective "I forgot my folder today"   Currently in Pain? No/denies               ADULT SLP TREATMENT - 08/26/17 1329      General Information   Behavior/Cognition Alert;Cooperative;Pleasant mood     Treatment Provided   Treatment provided Cognitive-Linquistic     Pain Assessment   Pain Assessment No/denies pain     Cognitive-Linquistic Treatment   Treatment focused on Cognition   Skilled Treatment Facilitated attention to detail and problem solving correcting errors with 85% accuracy and occasional min A. Alternating attention between code and filling in letters corresponding to code with conversation and  mild environmental distractors with extended time, 95% accuracy and mod I with alternating attention on this simple task      Progression Toward Goals   Progression toward goals Progressing toward goals          SLP Education - 08/26/17 1512    Education provided Yes   Education Details bring folder with calendar to therapy   Person(s) Educated Patient   Methods Explanation;Handout   Comprehension Verbalized understanding          SLP Short Term Goals - 08/26/17 1513      SLP SHORT TERM GOAL #1   Title Pt will selectively attend to simple cogntive linguistic problem solving task in mildly distracting environment with 90% accuracy and occasional min A   Time 1   Period Weeks   Status Achieved     SLP SHORT TERM GOAL #2   Title Pt will utilize external aids to manage schedule, medications, and be oriented with occasional min A over 3 sessions   Time 1   Status Partially Met     SLP SHORT TERM GOAL #3   Title Pt will solve simple functional time/money problems with 85% accuracy and occasional min A   Time 1   Period Weeks   Status Achieved     SLP SHORT TERM GOAL #4   Title Pt will follow 3 step detailed instructions/questions on cognitive linguistic tasks with less than 3 requests for  repeat out of 5 trials.    Time 1   Period Weeks   Status Not Met          SLP Long Term Goals - 08/26/17 1513      SLP LONG TERM GOAL #1   Title Pt will utlize external aids to manage schedule, meds, and finances with occasional min A over 2 sessionsl    Time 3   Period Weeks   Status On-going     SLP LONG TERM GOAL #2   Title Pt will alternate attention between 2 mildly complex cognitive linguistic tasks with 85% on each and occasional min A   Baseline 08/26/17;    Time 3   Period Weeks   Status On-going     SLP LONG TERM GOAL #3   Title Pt will solve mildly complex reasoning, organization, time/money problems with 85% accuracy and occasional min A.   Time 3   Period  Weeks   Status On-going          Plan - 08/26/17 1513    Clinical Impression Statement Pt continues to require extended time for all tasks, though today she demo'd WFL alternating attention in a mildly distracting environment and checked her work for errors with rare min A. Pt continues to required instruction to keep a calendar at home, in her therapy folder for independence of schedule management. Continue skilled ST to maximize cognition for possible return to living alone.    Speech Therapy Frequency 2x / week   Treatment/Interventions Compensatory strategies;Functional tasks;Patient/family education;Cognitive reorganization;Environmental controls;Internal/external aids;SLP instruction and feedback;Language facilitation   Potential to Achieve Goals Good      Patient will benefit from skilled therapeutic intervention in order to improve the following deficits and impairments:   Cognitive communication deficit    Problem List Patient Active Problem List   Diagnosis Date Noted  . Glucose intolerance (impaired glucose tolerance) 05/01/2017  . Depression 05/01/2017  . Hypertensive crisis   . Acute bilat watershed infarction 04/09/2017  . Intracranial vascular stenosis   . Hemiparesis affecting dominant side as late effect of stroke ([place])   . Post-operative pain   . S/P carotid endarterectomy   . Essential hypertension   . Stenosis of left carotid artery   . Hyperlipidemia   . Gait disturbance, post-stroke 04/02/2017  . Right arm weakness   . Slowness and poor responsiveness   . Hypertensive emergency 04/01/2017  . Hypokalemia 04/01/2017  . Hyperglycemia 04/01/2017  . Stroke-like symptoms 04/01/2017  . 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 ([place]) 04/01/2017    Zarek Relph, Annye Rusk MS, [place] 08/26/2017, 3:15 PM  [facility] [address] [place], [place], [address] Phone:  [phone]   Fax:  [phone]   Name: [name] [name] MRN: [id number] Date of Birth: June 18, 1946

## 2017-08-26 NOTE — Therapy (Signed)
[facility] [address] [place], [place], [address] Phone: [phone]   Fax:  [phone]  Physical Therapy Treatment  Patient Details  Name: [name] [name] MRN: [id number] Date of Birth: [birth year] Referring Provider: Alger Simons, MD  Encounter Date: 08/26/2017      PT End of Session - 08/26/17 1545    Visit Number 11   Number of Visits 14   Date for PT Re-Evaluation 08/28/17   Authorization Type [employer] 14 Visits left for PT   Authorization - Visit Number 11   Authorization - Number of Visits 14   PT Start Time 4496   PT Stop Time 1526   PT Time Calculation (min) 39 min   Equipment Utilized During Treatment Gait belt   Activity Tolerance Patient tolerated treatment well   Behavior During Therapy WFL for tasks assessed/performed      Past Medical History:  Diagnosis Date  . Hypertension   . Stroke [facility])     Past Surgical History:  Procedure Laterality Date  . ENDARTERECTOMY Left 04/08/2017   Procedure: ENDARTERECTOMY CAROTID;  Surgeon: Waynetta Sandy, MD;  Location: [facility];  Service: Vascular;  Laterality: Left;  . PATCH ANGIOPLASTY Left 04/08/2017   Procedure: PATCH ANGIOPLASTY Left Carotid;  Surgeon: Waynetta Sandy, MD;  Location: [facility];  Service: Vascular;  Laterality: Left;    Vitals:   08/26/17 1450  BP: (!) 164/78  Pulse: 89        Subjective Assessment - 08/26/17 1536    Subjective No new complaints, no falls or pain to report.    Patient is accompained by: Family member   Pertinent History *The patient and PT discussed BP and she notes her primary MD recommended she monitor and hold activity is systolic is >759 mmHG.   Limitations House hold activities;Walking   Patient Stated Goals "To be back to where I was physically."    Currently in Pain? No/denies             [facility] Adult PT Treatment/Exercise - 08/26/17 1630      Transfers   Sit to Stand 6: Modified  independent (Device/Increase time);Without upper extremity assist   Sit to Stand Details Other (comment)  cue to scoot to edge of chair    Five time sit to stand comments  19 secs with UE support, 30.31 with no UE support    Stand to Sit 6: Modified independent (Device/Increase time)     Ambulation/Gait   Ambulation/Gait Assistance 6: Modified independent (Device/Increase time)   Ambulation/Gait Assistance Details No cues or assistance required, Mod I for increased time    Ambulation Distance (Feet) 500 Feet   Assistive device None   Gait Pattern Within Functional Limits;Step-through pattern   Ambulation Surface Level;Unlevel;Indoor;Outdoor;Paved   Ramp 6: Modified independent (Device)   Curb 5: Supervision   Curb Details (indicate cue type and reason) Outdoor curb, supervision for safety, ascended/descended curb x2      Neuro Re-ed    Neuro Re-ed Details  Review of HEP, increased some exercises by decreasing counter support.                PT Education - 08/26/17 1537    Education provided Yes   Education Details Review of HEP, increased the less difficult exercises, new HEP handout given to the Pt.    Person(s) Educated Patient   Methods Explanation;Demonstration;Handout   Comprehension Verbalized understanding;Returned demonstration          PT  Short Term Goals - 08/26/17 1539      PT SHORT TERM GOAL #1   Title Pt/daughter will be independent with initial HEP in order to indicate improved functional mobility and balance.    Baseline 08/01/17: pt able to perform all current HEP with handouts, no cues needed.    Time 3   Period Weeks   Status Achieved     PT SHORT TERM GOAL #2   Title Pt will improve gait speed to 2.30 ft/sec w/ LRAD in order to indicate decreased fall risk.     Baseline 08/01/17: 2.04 ft/sec with no AD, improved just not to goal   Time 3   Period Weeks   Status Partially Met     PT SHORT TERM GOAL #3   Title Pt will improve 5TSS to <16 secs w/  single UE support in order to indicate decreased fall risk and improved functional strength.     Baseline 08/01/17: 15.53 sec's today with single UE support   Time 3   Period Weeks   Status Achieved     PT SHORT TERM GOAL #4   Title Will assess DGI and write appropriate LTG in order to better assess balance.    Baseline 07/16/17: met today with PT to set goal.   Time 3   Period Weeks   Status Achieved     PT SHORT TERM GOAL #5   Title Pt will negotiate up/down 16 stairs with B rails at mod I level in order to indicate safety getting to upstairs bathroom.     Baseline 08/01/17: met today   Time 3   Period Weeks   Status Achieved           PT Long Term Goals - 08/26/17 1540      PT LONG TERM GOAL #1   Title Pt/daughter will be independent with final HEP in order to indicate improved functional mobility and decreased fall risk.     Baseline 08/26/17: Met today with increase of some exercises by decreasing counter support    Status Achieved     PT LONG TERM GOAL #2   Title Pt will improve 5TSS to <15 secs without UE support in order to indicate decreased fall risk and improved functional strength.     Baseline 08/26/17: Pt performed 5x STS without UE support 30.31 seconds, with UE support 19 seconds   Period Weeks   Status On-going     PT LONG TERM GOAL #3   Title Pt will demonstrate ability to step into/out of tub with light UE support (on wall) at mod I level in order to indicate safe return to home.    Time 7   Period Weeks   Status New     PT LONG TERM GOAL #4   Title Pt will improve gait speed to >/=2.62 ft/sec in order to indicate decreased fall risk and improved efficiency of gait.     Time 7   Period Weeks   Status New     PT LONG TERM GOAL #5   Title Pt will ambulate up to 500' over unlevel paved surfaces (including ramp/curb) at distant S level (due to cognition) in order to indicate safe return to community.    Baseline 08/26/17: Pt met this goal today     Status Achieved             Plan - 08/26/17 1546    Clinical Impression Statement Pt tolerated treatment well, todays session focused on  HEP review and checking long term goals. Pt showed signifigant improvement with HEP exercises since last session, SPTA increased exercises to allow UE support only when necessary.     Rehab Potential Good   Clinical Impairments Affecting Rehab Potential co-morbidities    PT Frequency 2x / week   PT Duration Other (comment)   PT Treatment/Interventions ADLs/Self Care Home Management;DME Instruction;Gait training;Stair training;Functional mobility training;Therapeutic activities;Therapeutic exercise;Balance training;Neuromuscular re-education;Cognitive remediation;Patient/family education;Orthotic Fit/Training;Energy conservation;Vestibular   PT Next Visit Plan Check BP each visit; focus on checking remaining long term goals for anticipated discharge due to pt visit limit.    Consulted and Agree with Plan of Care Patient      Patient will benefit from skilled therapeutic intervention in order to improve the following deficits and impairments:  Abnormal gait, Decreased activity tolerance, Decreased balance, Decreased cognition, Decreased endurance, Decreased mobility, Decreased strength, Impaired perceived functional ability, Impaired flexibility, Impaired sensation, Improper body mechanics  Visit Diagnosis: Hemiplegia and hemiparesis following cerebral infarction affecting right dominant side (HCC)  Unsteadiness on feet  Abnormal posture  Muscle weakness (generalized)  Other abnormalities of gait and mobility     Problem List Patient Active Problem List   Diagnosis Date Noted  . Glucose intolerance (impaired glucose tolerance) 05/01/2017  . Depression 05/01/2017  . Hypertensive crisis   . Acute bilat watershed infarction 04/09/2017  . Intracranial vascular stenosis   . Hemiparesis affecting dominant side as late effect of stroke ([place])   .  Post-operative pain   . S/P carotid endarterectomy   . Essential hypertension   . Stenosis of left carotid artery   . Hyperlipidemia   . Gait disturbance, post-stroke 04/02/2017  . Right arm weakness   . Slowness and poor responsiveness   . Hypertensive emergency 04/01/2017  . Hypokalemia 04/01/2017  . Hyperglycemia 04/01/2017  . Stroke-like symptoms 04/01/2017  . 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 ([place]) 04/01/2017   Subrina Vecchiarelli, SPTA  Joshuah Minella 08/27/2017, 2:16 PM  [facility] [address] [place], [place], [address] Phone: [phone]   Fax:  [phone]  Name: [name] [name] MRN: [id number] Date of Birth: [birth year]  .This note has been reviewed and edited by supervising CI.  Willow Ora, PTA, [facility] [address] [place], [place] [address] [phone] 08/27/17, 2:42 PM

## 2017-08-26 NOTE — Therapy (Signed)
[facility] [address] [place], [place], [address] Phone: [phone]   Fax:  [phone]  Occupational Therapy Treatment  Patient Details  Name: [name] [name] MRN: [id number] Date of Birth: 09-15-1946 Referring Provider: Dr. Donetta Potts  Encounter Date: 08/26/2017      OT End of Session - 08/26/17 1700    Visit Number 7   Number of Visits 14   Date for OT Re-Evaluation 08/28/17   Authorization Type BC/BS MCR - G code required   Authorization Time Period At evaluation - 14 visits remaining for OT    Authorization - Visit Number 7   Authorization - Number of Visits 10   OT Start Time 1404   OT Stop Time 1445   OT Time Calculation (min) 41 min   Activity Tolerance Patient tolerated treatment well   Behavior During Therapy [facility] for tasks assessed/performed      Past Medical History:  Diagnosis Date  . Hypertension   . Stroke [facility])     Past Surgical History:  Procedure Laterality Date  . ENDARTERECTOMY Left 04/08/2017   Procedure: ENDARTERECTOMY CAROTID;  Surgeon: Waynetta Sandy, MD;  Location: [place];  Service: Vascular;  Laterality: Left;  . PATCH ANGIOPLASTY Left 04/08/2017   Procedure: PATCH ANGIOPLASTY Left Carotid;  Surgeon: Waynetta Sandy, MD;  Location: [place];  Service: Vascular;  Laterality: Left;    There were no vitals filed for this visit.      Subjective Assessment - 08/26/17 1405    Subjective  I am able to close it a little better, still not much grip   Pertinent History bilateral watershed infarction (Lt side worse than Rt) 04/01/17, Lt carotid endarterectomy 04/08/17, PMH: chronic Dlugosz vessel dz, h/o smaller strokes   Limitations no driving   Patient Stated Goals return to cooking and get my Rt hand better   Currently in Pain? No/denies   Pain Score 0-No pain            [facility] OT Assessment - 08/26/17 0001      Hand Function   Right Hand Grip (lbs) 18lbs                   OT Treatments/Exercises (OP) - 08/26/17 0001      ADLs   Medication Management Worked on methods to open medication bottles.  Patient unable to grasp and turn locked bottles, but able to use heel of hand to press and turn to open med containers.  Patient able to open screw top bottles.      Hand Exercises   Hand Gripper with Large Beads 25 beads on first setting   Digiticizer digiflex 10 reps , hold 3 sec for 1.5 lbs, 3 lbs, 5 lbs     Fine Motor Coordination   Fine Motor Coordination Grooved pegs   Other Fine Motor Exercises threading beads on string, cutting string with scissors in right hand.  Patient initially did not attempt to hold scissors with thumb in thumb hole, but with cueing able to effectively cut thread.                  OT Education - 08/26/17 1700    Education provided Yes   Education Details forced use concept in right hand - encouraged use of right hand for wringing dishcloth, dishes, etc   Person(s) Educated Patient   Methods Explanation;Demonstration   Comprehension Verbalized understanding;Need further instruction  OT Short Term Goals - 08/26/17 1703      OT SHORT TERM GOAL #1   Title Independent with coordination and putty HEP    Status Achieved     OT SHORT TERM GOAL #2   Title Pt to tie shoes or able to wear tie up shoes with A/E prn   Status Achieved     OT SHORT TERM GOAL #3   Title pt to safely perform tub/shower transfer with DME prn   Status On-going     OT SHORT TERM GOAL #4   Title Pt to perform simple cold meal, microwaveable meals, and snack prep mod I level consistently    Status Achieved     OT SHORT TERM GOAL #5   Title Pt to consistently participate in light household tasks (folding clothes, washing dishes)   Status Achieved           OT Long Term Goals - 08/20/17 1448      OT LONG TERM GOAL #1   Title Independent with updated HEP    Time 7   Period Weeks   Status New     OT  LONG TERM GOAL #2   Title Pt to improve coordination Rt hand as evidenced by reducing speed on 9 hole peg test to 38 sec. or under   Baseline eval: 50 sec   Time 7   Period Weeks   Status New     OT LONG TERM GOAL #3   Title Pt to improve grip strength Rt dominant hand to 25 lbs or greater to assist with opening jars/containers   Baseline eval: 10 lbs (Lt = 58 lbs)    Time 7   Period Weeks   Status New     OT LONG TERM GOAL #4   Title Pt to retrieve/replace 2 lb. object from high level shelf RUE with no reports of pain   Time 7   Period Weeks   Status New     OT LONG TERM GOAL #5   Title Pt to make simple meal mod I level safely   Time 7   Period Weeks   Status On-going  currently at sup level with min cues     OT LONG TERM GOAL #6   Title Pt/family to verbalize understanding with necessary DME, modifications, and external aids/devices to put in place if pt were to return to living alone   Time 7   Period Weeks   Status New               Plan - 08/26/17 1701    Clinical Impression Statement Patient has shown improved range of motion and improving strength in right hand.  Patient is progressing toward goals.     Rehab Potential Good   OT Frequency 2x / week   OT Duration --  7 weeks   OT Treatment/Interventions Self-care/ADL training;Moist Heat;DME and/or AE instruction;Splinting;Patient/family education;Therapeutic exercises;Therapeutic activities;Passive range of motion;Neuromuscular education;Functional Mobility Training;Manual Therapy;Visual/perceptual remediation/compensation;Cognitive remediation/compensation   Plan functional use of right hand where strength is required, washing dishes, wringing washcloth, pulling heavy door open, reaching RUE   Consulted and Agree with Plan of Care Patient      Patient will benefit from skilled therapeutic intervention in order to improve the following deficits and impairments:  Decreased coordination, Decreased range of  motion, Impaired flexibility, Decreased safety awareness, Decreased endurance, Impaired sensation, Decreased knowledge of precautions, Impaired UE functional use, Pain, Decreased mobility, Decreased strength, Decreased cognition, Impaired vision/preception  Visit Diagnosis: Hemiplegia and hemiparesis following cerebral infarction affecting right dominant side (HCC)  Unsteadiness on feet  Muscle weakness (generalized)  Abnormal posture  Other symptoms and signs involving cognitive functions following cerebral infarction  Other lack of coordination  Apraxia  Acute pain of right shoulder    Problem List Patient Active Problem List   Diagnosis Date Noted  . Glucose intolerance (impaired glucose tolerance) 05/01/2017  . Depression 05/01/2017  . Hypertensive crisis   . Acute bilat watershed infarction 04/09/2017  . Intracranial vascular stenosis   . Hemiparesis affecting dominant side as late effect of stroke ([place])   . Post-operative pain   . S/P carotid endarterectomy   . Essential hypertension   . Stenosis of left carotid artery   . Hyperlipidemia   . Gait disturbance, post-stroke 04/02/2017  . Right arm weakness   . Slowness and poor responsiveness   . Hypertensive emergency 04/01/2017  . Hypokalemia 04/01/2017  . Hyperglycemia 04/01/2017  . Stroke-like symptoms 04/01/2017  . 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 ([place]) 04/01/2017    Mariah Milling, OTR/L 08/26/2017, 5:05 PM  [facility] [address] [place], [place], [address] Phone: [phone]   Fax:  [phone]  Name: [name] [name] MRN: [id number] Date of Birth: 1946/02/11

## 2017-08-26 NOTE — Patient Instructions (Signed)
  Play solitaire at home  Continue to do cognitive activities at home  - board games, checkers, connect 4, card games  Continue to manage your calendar and keep track of appointments  Bring in your folder and copy of your calendar to therapy

## 2017-08-28 ENCOUNTER — Encounter: Payer: BLUE CROSS/BLUE SHIELD | Admitting: Occupational Therapy

## 2017-08-28 ENCOUNTER — Ambulatory Visit: Payer: BLUE CROSS/BLUE SHIELD | Admitting: Speech Pathology

## 2017-08-28 ENCOUNTER — Ambulatory Visit: Payer: BLUE CROSS/BLUE SHIELD

## 2017-08-28 ENCOUNTER — Other Ambulatory Visit: Payer: Self-pay | Admitting: Physical Medicine & Rehabilitation

## 2017-08-28 VITALS — BP 156/68 | HR 103

## 2017-08-28 DIAGNOSIS — R2681 Unsteadiness on feet: Secondary | ICD-10-CM

## 2017-08-28 DIAGNOSIS — I69351 Hemiplegia and hemiparesis following cerebral infarction affecting right dominant side: Secondary | ICD-10-CM

## 2017-08-28 DIAGNOSIS — R41841 Cognitive communication deficit: Secondary | ICD-10-CM

## 2017-08-28 DIAGNOSIS — R2689 Other abnormalities of gait and mobility: Secondary | ICD-10-CM

## 2017-08-28 NOTE — Therapy (Signed)
[facility] [address] [place], [place], [address] Phone: [phone]   Fax:  [phone]  Speech Language Pathology Treatment  Patient Details  Name: [name] [name] MRN: [id number] Date of Birth: 1945/12/05 Referring Provider: Dr. Alger Simons  Encounter Date: 08/28/2017      End of Session - 08/28/17 1522    Visit Number 9   Number of Visits 17   Date for SLP Re-Evaluation 09/05/17   Authorization Type Blue medicare    Authorization - Visit Number 9   Authorization - Number of Visits 24   SLP Start Time 9381   SLP Stop Time  1530   SLP Time Calculation (min) 44 min   Activity Tolerance Patient tolerated treatment well      Past Medical History:  Diagnosis Date  . Hypertension   . Stroke [facility])     Past Surgical History:  Procedure Laterality Date  . ENDARTERECTOMY Left 04/08/2017   Procedure: ENDARTERECTOMY CAROTID;  Surgeon: Waynetta Sandy, MD;  Location: [place];  Service: Vascular;  Laterality: Left;  . PATCH ANGIOPLASTY Left 04/08/2017   Procedure: PATCH ANGIOPLASTY Left Carotid;  Surgeon: Waynetta Sandy, MD;  Location: [place];  Service: Vascular;  Laterality: Left;    There were no vitals filed for this visit.      Subjective Assessment - 08/28/17 1446    Subjective "No" (re: whether she had homework or not)   Currently in Pain? No/denies               ADULT SLP TREATMENT - 08/28/17 1447      General Information   Behavior/Cognition Alert;Cooperative;Pleasant mood   Patient Positioning Upright in chair     Treatment Provided   Treatment provided Cognitive-Linquistic     Pain Assessment   Pain Assessment No/denies pain     Cognitive-Linquistic Treatment   Treatment focused on Cognition   Skilled Treatment Targeted alternating attention today between 2 mildly complex tasks (unscrambling words, sentences). Pt required extended time and usual mod A initially to  switch between tasks. SLP able to fade cues to rare min A. ID'd wrong items in abstract categories with 100% accuracy and extended time.     Assessment / Recommendations / Plan   Plan Continue with current plan of care     Progression Toward Goals   Progression toward goals Progressing toward goals            SLP Short Term Goals - 08/28/17 1451      SLP SHORT TERM GOAL #1   Title Pt will selectively attend to simple cogntive linguistic problem solving task in mildly distracting environment with 90% accuracy and occasional min A   Status Achieved     SLP SHORT TERM GOAL #2   Title Pt will utilize external aids to manage schedule, medications, and be oriented with occasional min A over 3 sessions   Status Partially Met     SLP SHORT TERM GOAL #3   Title Pt will solve simple functional time/money problems with 85% accuracy and occasional min A   Status Achieved     SLP SHORT TERM GOAL #4   Title Pt will follow 3 step detailed instructions/questions on cognitive linguistic tasks with less than 3 requests for repeat out of 5 trials.    Status Not Met          SLP Long Term Goals - 08/28/17 1451      SLP LONG TERM GOAL #1  Title Pt will utlize external aids to manage schedule, meds, and finances with occasional min A over 2 sessionsl    Time 3   Period Weeks   Status On-going     SLP LONG TERM GOAL #2   Title Pt will alternate attention between 2 mildly complex cognitive linguistic tasks with 85% on each and occasional min A   Time 3   Period Weeks   Status On-going     SLP LONG TERM GOAL #3   Title Pt will solve mildly complex reasoning, organization, time/money problems with 85% accuracy and occasional min A.   Time 3   Period Weeks   Status On-going          Plan - 08/28/17 1531    Clinical Impression Statement Pt continues to require extended time for all tasks, though today she demo'd WFL alternating attention in a mildly distracting environment and  checked her work for errors with rare min A. Pt continues to required instruction to keep a calendar at home, in her therapy folder for independence of schedule management. Continue skilled ST to maximize cognition for possible return to living alone.    Speech Therapy Frequency 2x / week   Treatment/Interventions Compensatory strategies;Functional tasks;Patient/family education;Cognitive reorganization;Environmental controls;Internal/external aids;SLP instruction and feedback;Language facilitation   Potential to Achieve Goals Good   SLP Home Exercise Plan reviewed/provided   Consulted and Agree with Plan of Care Patient      Patient will benefit from skilled therapeutic intervention in order to improve the following deficits and impairments:   Cognitive communication deficit    Problem List Patient Active Problem List   Diagnosis Date Noted  . Glucose intolerance (impaired glucose tolerance) 05/01/2017  . Depression 05/01/2017  . Hypertensive crisis   . Acute bilat watershed infarction 04/09/2017  . Intracranial vascular stenosis   . Hemiparesis affecting dominant side as late effect of stroke ([place])   . Post-operative pain   . S/P carotid endarterectomy   . Essential hypertension   . Stenosis of left carotid artery   . Hyperlipidemia   . Gait disturbance, post-stroke 04/02/2017  . Right arm weakness   . Slowness and poor responsiveness   . Hypertensive emergency 04/01/2017  . Hypokalemia 04/01/2017  . Hyperglycemia 04/01/2017  . Stroke-like symptoms 04/01/2017  . 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 ([place]) 04/01/2017   Deneise Lever, [place], [place] 08/28/2017, 3:32 PM  [facility] [address] [place], [place], [address] Phone: [phone]   Fax:  [phone]   Name: Alexzandria Massman MRN: [id number] Date of Birth: [birth year]

## 2017-08-28 NOTE — Therapy (Signed)
[facility] [address] [place], [place], [address] Phone: [phone]   Fax:  [phone]  Physical Therapy Treatment  Patient Details  Name: [name] [name] MRN: [id number] Date of Birth: [birth year] Referring Provider: Alger Simons, MD  Encounter Date: 08/28/2017      PT End of Session - 08/28/17 1413    Visit Number 12   Number of Visits 14   Date for PT Re-Evaluation 08/28/17   Authorization Type BCBS 14 Visits left for PT   Authorization - Visit Number 12   Authorization - Number of Visits 14   PT Start Time 1400   PT Stop Time 9390  FOTO completed 1411-1419   PT Time Calculation (min) 19 min   Activity Tolerance Patient tolerated treatment well   Behavior During Therapy [facility] for tasks assessed/performed      Past Medical History:  Diagnosis Date  . Hypertension   . Stroke [facility])     Past Surgical History:  Procedure Laterality Date  . ENDARTERECTOMY Left 04/08/2017   Procedure: ENDARTERECTOMY CAROTID;  Surgeon: Waynetta Sandy, MD;  Location: [place];  Service: Vascular;  Laterality: Left;  . PATCH ANGIOPLASTY Left 04/08/2017   Procedure: PATCH ANGIOPLASTY Left Carotid;  Surgeon: Waynetta Sandy, MD;  Location: [place];  Service: Vascular;  Laterality: Left;    Vitals:   08/28/17 1415  BP: (!) 156/68  Pulse: (!) 103        Subjective Assessment - 08/28/17 1403    Subjective Pt denied falls or changes since last visit.    Pertinent History *The patient and PT discussed BP and she notes her primary MD recommended she monitor and hold activity is systolic is >300 mmHG.   Patient Stated Goals "To be back to where I was physically."    Currently in Pain? No/denies        Neuro QOL LE: 42.8 (no charge while completing FOTO survey).                 [facility] Adult PT Treatment/Exercise - 08/28/17 1404      Transfers   Transfers --  standing to mat txf to mimic tub txf    Comments MOD I level, used UE support on chair to bring BLEs onto mat to mimic tub txf.      Ambulation/Gait   Ambulation/Gait Yes   Ambulation/Gait Assistance 6: Modified independent (Device/Increase time)   Ambulation/Gait Assistance Details No evidence of LOB.   Ambulation Distance (Feet) 75 Feet  x3   Assistive device None   Gait Pattern Within Functional Limits;Step-through pattern   Ambulation Surface Level;Indoor   Gait velocity 2.55f/sec and 2.156fsec.                PT Education - 08/28/17 1412    Education provided Yes   Education Details PT discussed goal progress and d/c. Pt agreeable. PT reiterated the importance of checking BP and HR to ensure it is WNL.   Person(s) Educated Patient   Methods Explanation   Comprehension Verbalized understanding          PT Short Term Goals - 08/26/17 1539      PT SHORT TERM GOAL #1   Title Pt/daughter will be independent with initial HEP in order to indicate improved functional mobility and balance.    Baseline 08/01/17: pt able to perform all current HEP with handouts, no cues needed.    Time 3   Period Weeks   Status Achieved  PT SHORT TERM GOAL #2   Title Pt will improve gait speed to 2.30 ft/sec w/ LRAD in order to indicate decreased fall risk.     Baseline 08/01/17: 2.04 ft/sec with no AD, improved just not to goal   Time 3   Period Weeks   Status Partially Met     PT SHORT TERM GOAL #3   Title Pt will improve 5TSS to <16 secs w/ single UE support in order to indicate decreased fall risk and improved functional strength.     Baseline 08/01/17: 15.53 sec's today with single UE support   Time 3   Period Weeks   Status Achieved     PT SHORT TERM GOAL #4   Title Will assess DGI and write appropriate LTG in order to better assess balance.    Baseline 07/16/17: met today with PT to set goal.   Time 3   Period Weeks   Status Achieved     PT SHORT TERM GOAL #5   Title Pt will negotiate up/down 16 stairs  with B rails at mod I level in order to indicate safety getting to upstairs bathroom.     Baseline 08/01/17: met today   Time 3   Period Weeks   Status Achieved           PT Long Term Goals - 08/28/17 1414      PT LONG TERM GOAL #1   Title Pt/daughter will be independent with final HEP in order to indicate improved functional mobility and decreased fall risk.     Baseline 08/26/17: Met today with increase of some exercises by decreasing counter support    Status Achieved     PT LONG TERM GOAL #2   Title Pt will improve 5TSS to <15 secs without UE support in order to indicate decreased fall risk and improved functional strength.     Baseline 08/26/17: Pt performed 5x STS without UE support 30.31 seconds, with UE support 19 seconds   Period Weeks   Status On-going     PT LONG TERM GOAL #3   Title Pt will demonstrate ability to step into/out of tub with light UE support (on wall) at mod I level in order to indicate safe return to home.    Time 7   Period Weeks   Status Achieved     PT LONG TERM GOAL #4   Title Pt will improve gait speed to >/=2.62 ft/sec in order to indicate decreased fall risk and improved efficiency of gait.     Time 7   Period Weeks   Status Partially Met     PT LONG TERM GOAL #5   Title Pt will ambulate up to 500' over unlevel paved surfaces (including ramp/curb) at distant S level (due to cognition) in order to indicate safe return to community.    Baseline 08/26/17: Pt met this goal today    Status Achieved               Plan - 08/28/17 1413    Clinical Impression Statement Pt met LTG 3 and partially met LTG 4. Pt d/c today due to great progress and insurance visit limitations. Please see d/c summary for details.    Rehab Potential Good   Clinical Impairments Affecting Rehab Potential co-morbidities    PT Frequency 2x / week   PT Duration Other (comment)   PT Treatment/Interventions ADLs/Self Care Home Management;DME Instruction;Gait  training;Stair training;Functional mobility training;Therapeutic activities;Therapeutic exercise;Balance training;Neuromuscular re-education;Cognitive remediation;Patient/family education;Orthotic Fit/Training;Energy  conservation;Vestibular   PT Next Visit Plan d/c   Consulted and Agree with Plan of Care Patient      Patient will benefit from skilled therapeutic intervention in order to improve the following deficits and impairments:  Abnormal gait, Decreased activity tolerance, Decreased balance, Decreased cognition, Decreased endurance, Decreased mobility, Decreased strength, Impaired perceived functional ability, Impaired flexibility, Impaired sensation, Improper body mechanics  Visit Diagnosis: Unsteadiness on feet  Hemiplegia and hemiparesis following cerebral infarction affecting right dominant side (HCC)  Other abnormalities of gait and mobility     Problem List Patient Active Problem List   Diagnosis Date Noted  . Glucose intolerance (impaired glucose tolerance) 05/01/2017  . Depression 05/01/2017  . Hypertensive crisis   . Acute bilat watershed infarction 04/09/2017  . Intracranial vascular stenosis   . Hemiparesis affecting dominant side as late effect of stroke ([place])   . Post-operative pain   . S/P carotid endarterectomy   . Essential hypertension   . Stenosis of left carotid artery   . Hyperlipidemia   . Gait disturbance, post-stroke 04/02/2017  . Right arm weakness   . Slowness and poor responsiveness   . Hypertensive emergency 04/01/2017  . Hypokalemia 04/01/2017  . Hyperglycemia 04/01/2017  . Stroke-like symptoms 04/01/2017  . 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 ([place]) 04/01/2017    , L 08/28/2017, 2:20 PM  [facility] [address] [place], [place], [address] Phone: [phone]   Fax:  [phone]  Name: Valbona Slabach MRN: [id number] Date of  Birth: 1946-05-11  PHYSICAL THERAPY DISCHARGE SUMMARY  Visits from Start of Care: 12  Current functional level related to goals / functional outcomes:     PT Long Term Goals - 08/28/17 1414      PT LONG TERM GOAL #1   Title Pt/daughter will be independent with final HEP in order to indicate improved functional mobility and decreased fall risk.     Baseline 08/26/17: Met today with increase of some exercises by decreasing counter support    Status Achieved     PT LONG TERM GOAL #2   Title Pt will improve 5TSS to <15 secs without UE support in order to indicate decreased fall risk and improved functional strength.     Baseline 08/26/17: Pt performed 5x STS without UE support 30.31 seconds, with UE support 19 seconds   Period Weeks   Status On-going     PT LONG TERM GOAL #3   Title Pt will demonstrate ability to step into/out of tub with light UE support (on wall) at mod I level in order to indicate safe return to home.    Time 7   Period Weeks   Status Achieved     PT LONG TERM GOAL #4   Title Pt will improve gait speed to >/=2.62 ft/sec in order to indicate decreased fall risk and improved efficiency of gait.     Time 7   Period Weeks   Status Partially Met     PT LONG TERM GOAL #5   Title Pt will ambulate up to 500' over unlevel paved surfaces (including ramp/curb) at distant S level (due to cognition) in order to indicate safe return to community.    Baseline 08/26/17: Pt met this goal today    Status Achieved        Remaining deficits: Decr. Gait speed.   Education / Equipment: HEP  Plan: Patient agrees to discharge.  Patient goals were partially met. Patient  is being discharged due to financial reasons.  ?????         Geoffry Paradise, PT,DPT 08/28/17 2:21 PM Phone: [phone] Fax: [phone]

## 2017-08-29 ENCOUNTER — Other Ambulatory Visit: Payer: Self-pay

## 2017-08-29 ENCOUNTER — Telehealth: Payer: Self-pay | Admitting: Family Medicine

## 2017-08-29 NOTE — Telephone Encounter (Signed)
Patient is requesting a refill of her cholesterol meds (patient only has 2 pills left). Send to [facility]

## 2017-08-29 NOTE — Telephone Encounter (Signed)
Patient does not get the medication from our office, Dr Naaman Plummer manages this medication for the patient. Looking in the chart the medication is pending from his office for refill.   Called and left a message for them to contact his office. MPulliam, CMA/RT(R)

## 2017-09-01 ENCOUNTER — Other Ambulatory Visit: Payer: Self-pay | Admitting: Family Medicine

## 2017-09-01 ENCOUNTER — Other Ambulatory Visit: Payer: Self-pay

## 2017-09-01 DIAGNOSIS — E785 Hyperlipidemia, unspecified: Secondary | ICD-10-CM

## 2017-09-01 MED ORDER — ATORVASTATIN CALCIUM 80 MG PO TABS: 80.0000 mg | ORAL_TABLET | Freq: Every day | ORAL | 0 refills | Status: DC

## 2017-09-01 NOTE — Telephone Encounter (Signed)
Okay for 1:30 day supply and no refill given to patient.  This says that this is a duplicate.  Please make sure only 1 prescription is given to patient of 30 days and no refill.  She will need an office visit with me as she was told 4 months ago to follow-up for fasting blood work in mid to end of October.

## 2017-09-01 NOTE — Telephone Encounter (Signed)
Refill request sent to Dr Raliegh Scarlet. MPulliam, CMA/RT(R)

## 2017-09-01 NOTE — Telephone Encounter (Signed)
Pharmacy sent over a refill request for Atorvastatin, medication was last filled by a previous provider.  Request sent to Dr Raliegh Scarlet for review. MPulliam, CMA/RT(R)

## 2017-09-02 ENCOUNTER — Ambulatory Visit: Payer: BLUE CROSS/BLUE SHIELD | Admitting: Occupational Therapy

## 2017-09-02 ENCOUNTER — Ambulatory Visit: Payer: BLUE CROSS/BLUE SHIELD | Admitting: Speech Pathology

## 2017-09-02 DIAGNOSIS — I69351 Hemiplegia and hemiparesis following cerebral infarction affecting right dominant side: Secondary | ICD-10-CM

## 2017-09-02 DIAGNOSIS — R41841 Cognitive communication deficit: Secondary | ICD-10-CM | POA: Diagnosis not present

## 2017-09-02 DIAGNOSIS — M6281 Muscle weakness (generalized): Secondary | ICD-10-CM

## 2017-09-02 DIAGNOSIS — I69318 Other symptoms and signs involving cognitive functions following cerebral infarction: Secondary | ICD-10-CM

## 2017-09-02 NOTE — Therapy (Signed)
[facility] [address] [place], [place], [address] Phone: [phone]   Fax:  [phone]  Occupational Therapy Treatment  Patient Details  Name: [name] [name] MRN: [id number] Date of Birth: 09/24/1946 Referring Provider: Dr. Donetta Potts  Encounter Date: 09/02/2017      OT End of Session - 09/02/17 1408    Visit Number 8   Number of Visits 14   Date for OT Re-Evaluation 09/08/17   Authorization Type BC/BS MCR - G code required   Authorization Time Period At evaluation - 14 visits remaining for OT    Authorization - Visit Number 8   Authorization - Number of Visits 10   OT Start Time 1315   OT Stop Time 1400   OT Time Calculation (min) 45 min   Activity Tolerance Patient tolerated treatment well      Past Medical History:  Diagnosis Date  . Hypertension   . Stroke [facility])     Past Surgical History:  Procedure Laterality Date  . ENDARTERECTOMY Left 04/08/2017   Procedure: ENDARTERECTOMY CAROTID;  Surgeon: Waynetta Sandy, MD;  Location: [place];  Service: Vascular;  Laterality: Left;  . PATCH ANGIOPLASTY Left 04/08/2017   Procedure: PATCH ANGIOPLASTY Left Carotid;  Surgeon: Waynetta Sandy, MD;  Location: [place];  Service: Vascular;  Laterality: Left;    There were no vitals filed for this visit.      Subjective Assessment - 09/02/17 1323    Pertinent History bilateral watershed infarction (Lt side worse than Rt) 04/01/17, Lt carotid endarterectomy 04/08/17, PMH: chronic [place] vessel dz, h/o smaller strokes   Limitations no driving   Patient Stated Goals return to cooking and get my Rt hand better   Currently in Pain? No/denies                      OT Treatments/Exercises (OP) - 09/02/17 0001      ADLs   Home Maintenance Pt practiced washing dishes, wringing out washcloth, and getting glasses down from cabinet and then replacing with RUE.    ADL Comments Began  discussion on DME needs (tub bench) , external aids, and/or modifications for returning home. Discussed use of Lifeline (or similar device), tub bench, options for chopping (including A/E or buying items already chopped), microwaveable meals and/or meals provided from family - however pt may be able to do simple stovetop cooking mod I level with practice. Pt instructed to use Rt hand for all functional tasks EXCEPT:  chopping/cutting, carrying hot items (like coffee or pot of boiling water), and use both hands or Lt hand for heavy items.      Hand Exercises   Other Hand Exercises Gripper set at level 1 resistance to pick up blocks RT hand for sustained grip strength with min difficulty only, 1 drop, and no rest breaks. Practiced "tug of war" with towel x 5 reps holding 5 sec.    Other Hand Exercises Pt placed graded clothespins on antenna and removing for pinch strength Rt hand (red and green resistance)                   OT Short Term Goals - 09/02/17 1408      OT SHORT TERM GOAL #1   Title Independent with coordination and putty HEP    Status Achieved     OT SHORT TERM GOAL #2   Title Pt to tie shoes or able to wear tie up shoes with A/E  prn   Status Achieved     OT SHORT TERM GOAL #3   Title pt to safely perform tub/shower transfer with DME prn   Status Achieved     OT SHORT TERM GOAL #4   Title Pt to perform simple cold meal, microwaveable meals, and snack prep mod I level consistently    Status Achieved     OT SHORT TERM GOAL #5   Title Pt to consistently participate in light household tasks (folding clothes, washing dishes)   Status Achieved           OT Long Term Goals - 09/02/17 1409      OT LONG TERM GOAL #1   Title Independent with updated HEP    Time 7   Period Weeks   Status New     OT LONG TERM GOAL #2   Title Pt to improve coordination Rt hand as evidenced by reducing speed on 9 hole peg test to 38 sec. or under   Baseline eval: 50 sec   Time 7    Period Weeks   Status On-going     OT LONG TERM GOAL #3   Title Pt to improve grip strength Rt dominant hand to 25 lbs or greater to assist with opening jars/containers   Baseline eval: 10 lbs (Lt = 58 lbs)    Time 7   Period Weeks   Status On-going     OT LONG TERM GOAL #4   Title Pt to retrieve/replace 2 lb. object from high level shelf RUE with no reports of pain   Time 7   Period Weeks   Status On-going     OT LONG TERM GOAL #5   Title Pt to make simple meal mod I level safely   Time 7   Period Weeks   Status On-going  currently at sup level with min cues     OT LONG TERM GOAL #6   Title Pt/family to verbalize understanding with necessary DME, modifications, and external aids/devices to put in place if pt were to return to living alone   Time 7   Period Weeks   Status New               Plan - 09/02/17 1409    Clinical Impression Statement Pt progressing with RUE functional use and increased independence with simple IADLS.    Rehab Potential Good   Current Impairments/barriers affecting progress: chronic [place] vessel disease making her more prone to future strokes   OT Frequency 2x / week   OT Duration --  7 WEEKS   OT Treatment/Interventions Self-care/ADL training;Moist Heat;DME and/or AE instruction;Splinting;Patient/family education;Therapeutic exercises;Therapeutic activities;Passive range of motion;Neuromuscular education;Functional Mobility Training;Manual Therapy;Visual/perceptual remediation/compensation;Cognitive remediation/compensation   Plan practice cooking and safety with Rt hand while over stove, home maintence tasks (vacuuming, making bed) in prep for living alone   Consulted and Agree with Plan of Care Patient      Patient will benefit from skilled therapeutic intervention in order to improve the following deficits and impairments:  Decreased coordination, Decreased range of motion, Impaired flexibility, Decreased safety awareness, Decreased  endurance, Impaired sensation, Decreased knowledge of precautions, Impaired UE functional use, Pain, Decreased mobility, Decreased strength, Decreased cognition, Impaired vision/preception  Visit Diagnosis: Hemiplegia and hemiparesis following cerebral infarction affecting right dominant side (HCC)  Muscle weakness (generalized)  Other symptoms and signs involving cognitive functions following cerebral infarction    Problem List Patient Active Problem List   Diagnosis Date Noted  .  Glucose intolerance (impaired glucose tolerance) 05/01/2017  . Depression 05/01/2017  . Hypertensive crisis   . Acute bilat watershed infarction 04/09/2017  . Intracranial vascular stenosis   . Hemiparesis affecting dominant side as late effect of stroke ([place])   . Post-operative pain   . S/P carotid endarterectomy   . Essential hypertension   . Stenosis of left carotid artery   . Hyperlipidemia   . Gait disturbance, post-stroke 04/02/2017  . Right arm weakness   . Slowness and poor responsiveness   . Hypertensive emergency 04/01/2017  . Hypokalemia 04/01/2017  . Hyperglycemia 04/01/2017  . Stroke-like symptoms 04/01/2017  . 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 ([place]) 04/01/2017    Carey Bullocks, OTR/L 09/02/2017, 2:11 PM  [facility] [address] [place], [place], [address] Phone: [phone]   Fax:  [phone]  Name: Meredyth Hornung MRN: [id number] Date of Birth: Sep 04, 1946

## 2017-09-02 NOTE — Telephone Encounter (Signed)
Spoke to the daughter and informed her that the medication was filled for 30 days and that the patient would need a follow up with Dr Raliegh Scarlet.  She states that she will get with Mrs. Berkel and call back to make the appointment. MPulliam, CMA/RT(R)

## 2017-09-02 NOTE — Therapy (Signed)
[facility] [address] [place], [place], [address] Phone: [phone]   Fax:  [phone]  Speech Language Pathology Treatment  Patient Details  Name: [name] [name] MRN: [id number] Date of Birth: [birth year] Referring Provider: Dr. Alger Simons  Encounter Date: 09/02/2017      End of Session - 09/02/17 1452    Visit Number 10   Number of Visits 17   Date for SLP Re-Evaluation 09/05/17   Authorization Type Blue medicare    Authorization - Visit Number 10   Authorization - Number of Visits 24   SLP Start Time 4462   SLP Stop Time  1445   SLP Time Calculation (min) 42 min   Activity Tolerance Patient tolerated treatment well      Past Medical History:  Diagnosis Date  . Hypertension   . Stroke [facility])     Past Surgical History:  Procedure Laterality Date  . ENDARTERECTOMY Left 04/08/2017   Procedure: ENDARTERECTOMY CAROTID;  Surgeon: Waynetta Sandy, MD;  Location: [place];  Service: Vascular;  Laterality: Left;  . PATCH ANGIOPLASTY Left 04/08/2017   Procedure: PATCH ANGIOPLASTY Left Carotid;  Surgeon: Waynetta Sandy, MD;  Location: [place];  Service: Vascular;  Laterality: Left;    There were no vitals filed for this visit.      Subjective Assessment - 09/02/17 1407    Subjective "I played solitaire and it went well"   Currently in Pain? No/denies               ADULT SLP TREATMENT - 09/02/17 1408      General Information   Behavior/Cognition Alert;Cooperative;Pleasant mood     Treatment Provided   Treatment provided Cognitive-Linquistic     Pain Assessment   Pain Assessment No/denies pain     Cognitive-Linquistic Treatment   Treatment focused on Cognition   Skilled Treatment Facilitated attention to detail and mildly complex functional time problem solving, reading bank schedules  with  extended time, occasional min A for attention to details, mod A for problem solving.  Mildly complex functional money problem solving - pt required mod A for mental math, however she solved mental math problems using written work with extended time and occasional min A     Assessment / Recommendations / Plan   Plan Continue with current plan of care     Progression Toward Goals   Progression toward goals Progressing toward goals            SLP Short Term Goals - 09/02/17 1452      SLP SHORT TERM GOAL #1   Title Pt will selectively attend to simple cogntive linguistic problem solving task in mildly distracting environment with 90% accuracy and occasional min A   Time 1   Period Weeks   Status Achieved     SLP SHORT TERM GOAL #2   Title Pt will utilize external aids to manage schedule, medications, and be oriented with occasional min A over 3 sessions   Time 1   Period Weeks   Status Partially Met     SLP SHORT TERM GOAL #3   Title Pt will solve simple functional time/money problems with 85% accuracy and occasional min A   Time 1   Period Weeks   Status Achieved     SLP SHORT TERM GOAL #4   Title Pt will follow 3 step detailed instructions/questions on cognitive linguistic tasks with less than 3 requests for repeat out of 5 trials.  Time 1   Period Weeks   Status Not Met          SLP Long Term Goals - Sep 08, 2017 1452      SLP LONG TERM GOAL #1   Title Pt will utlize external aids to manage schedule, meds, and finances with occasional min A over 2 sessionsl    Time 2   Period Weeks   Status On-going     SLP LONG TERM GOAL #2   Title Pt will alternate attention between 2 mildly complex cognitive linguistic tasks with 85% on each and occasional min A   Time 2   Period Weeks   Status On-going     SLP LONG TERM GOAL #3   Title Pt will solve mildly complex reasoning, organization, time/money problems with 85% accuracy and occasional min A.   Time 3   Period Weeks   Status On-going     Speech Therapy Progress Note  Dates of Reporting Period:  07/10/17 to 08/28/17  Objective Reports of Subjective Statement: Pt continues to require assistance, modifications and supervision for cooking, finances, cleaning etc due to reduced attention resulting in safety concerns  Objective Measurements: See goals  Goal Update: Continue all goals  Plan: Continue POC  Reason Skilled Services are Required: Pt lived independently and alone prior to CVA. At this time, she is living with her daughter. Pt's goal is to return home to living alone. Skilled ST required to improve cognition for safety and independence for successful return home.       Plan - Sep 08, 2017 1450    Clinical Impression Statement Extended time for all tasks due to slow processing. Simple to mildly complex functional time and money problem solving with occasional min to mod A. Attention to detail with occasional min A. Continue skilled ST to maximize cognition for return to independent living   Speech Therapy Frequency 2x / week   Treatment/Interventions Compensatory strategies;Functional tasks;Patient/family education;Cognitive reorganization;Environmental controls;Internal/external aids;SLP instruction and feedback;Language facilitation   Potential to Achieve Goals Good      Patient will benefit from skilled therapeutic intervention in order to improve the following deficits and impairments:   Cognitive communication deficit      G-Codes - 2017-09-08 1453    Functional Assessment Tool Used NOMS   Functional Limitations Attention   Attention Current Status (W2376) At least 40 percent but less than 60 percent impaired, limited or restricted   Attention Goal Status (E8315) At least 20 percent but less than 40 percent impaired, limited or restricted      Problem List Patient Active Problem List   Diagnosis Date Noted  . Glucose intolerance (impaired glucose tolerance) 05/01/2017  . Depression 05/01/2017  . Hypertensive crisis   . Acute bilat watershed infarction 04/09/2017   . Intracranial vascular stenosis   . Hemiparesis affecting dominant side as late effect of stroke ([place])   . Post-operative pain   . S/P carotid endarterectomy   . Essential hypertension   . Stenosis of left carotid artery   . Hyperlipidemia   . Gait disturbance, post-stroke 04/02/2017  . Right arm weakness   . Slowness and poor responsiveness   . Hypertensive emergency 04/01/2017  . Hypokalemia 04/01/2017  . Hyperglycemia 04/01/2017  . Stroke-like symptoms 04/01/2017  . 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 [place], [place], [address] Phone:  [phone]   Fax:  [phone]   Name: [name] [name] MRN: [id number] Date of Birth: 1946/03/08

## 2017-09-03 ENCOUNTER — Ambulatory Visit: Payer: BLUE CROSS/BLUE SHIELD | Admitting: Family Medicine

## 2017-09-04 ENCOUNTER — Ambulatory Visit: Payer: BLUE CROSS/BLUE SHIELD | Admitting: Occupational Therapy

## 2017-09-04 ENCOUNTER — Ambulatory Visit: Payer: BLUE CROSS/BLUE SHIELD | Admitting: Speech Pathology

## 2017-09-04 DIAGNOSIS — M6281 Muscle weakness (generalized): Secondary | ICD-10-CM

## 2017-09-04 DIAGNOSIS — R41841 Cognitive communication deficit: Secondary | ICD-10-CM

## 2017-09-04 DIAGNOSIS — I69318 Other symptoms and signs involving cognitive functions following cerebral infarction: Secondary | ICD-10-CM

## 2017-09-04 DIAGNOSIS — I69351 Hemiplegia and hemiparesis following cerebral infarction affecting right dominant side: Secondary | ICD-10-CM

## 2017-09-04 NOTE — Therapy (Signed)
[facility] [address] [place], [place], [address] Phone: [phone]   Fax:  [phone]  Speech Language Pathology Treatment  Patient Details  Name: [name] [name] MRN: [id number] Date of Birth: 20-Jun-1946 Referring Provider: Dr. Alger Simons  Encounter Date: 09/04/2017      End of Session - 09/04/17 1410    Visit Number 11   Number of Visits 17   Date for SLP Re-Evaluation 09/05/17   Authorization Type Blue medicare    Authorization - Visit Number 11   Authorization - Number of Visits 24   SLP Start Time 7062   SLP Stop Time  1404   SLP Time Calculation (min) 30 min   Activity Tolerance Patient tolerated treatment well      Past Medical History:  Diagnosis Date  . Hypertension   . Stroke [facility])     Past Surgical History:  Procedure Laterality Date  . ENDARTERECTOMY Left 04/08/2017   Procedure: ENDARTERECTOMY CAROTID;  Surgeon: Waynetta Sandy, MD;  Location: [facility];  Service: Vascular;  Laterality: Left;  . PATCH ANGIOPLASTY Left 04/08/2017   Procedure: PATCH ANGIOPLASTY Left Carotid;  Surgeon: Waynetta Sandy, MD;  Location: [facility];  Service: Vascular;  Laterality: Left;    There were no vitals filed for this visit.      Subjective Assessment - 09/04/17 1353    Subjective "I feel like I'm doing well until I come here"   Currently in Pain? No/denies               ADULT SLP TREATMENT - 09/04/17 1353      General Information   Behavior/Cognition Alert;Cooperative;Pleasant mood     Treatment Provided   Treatment provided Cognitive-Linquistic     Pain Assessment   Pain Assessment No/denies pain     Cognitive-Linquistic Treatment   Treatment focused on Cognition   Skilled Treatment Pt arrived 20 minutes late for session. Shereports she has not done sweeping, light cooking as prescribed by OT. Provided ongoing encouragement for pt to attempt more complex chores with  supervision.  Problem solving midlly complex organization task with 85% accuracy and rare min A. Alternating attention between menu plan and simple conversation required min A for pt to return to menu plan.      Assessment / Recommendations / Plan   Plan Continue with current plan of care     Progression Toward Goals   Progression toward goals Progressing toward goals            SLP Short Term Goals - 09/04/17 1410      SLP SHORT TERM GOAL #1   Title Pt will selectively attend to simple cogntive linguistic problem solving task in mildly distracting environment with 90% accuracy and occasional min A   Time 1   Period Weeks   Status Achieved     SLP SHORT TERM GOAL #2   Title Pt will utilize external aids to manage schedule, medications, and be oriented with occasional min A over 3 sessions   Time 1   Period Weeks   Status Partially Met     SLP SHORT TERM GOAL #3   Title Pt will solve simple functional time/money problems with 85% accuracy and occasional min A   Time 1   Period Weeks   Status Achieved     SLP SHORT TERM GOAL #4   Title Pt will follow 3 step detailed instructions/questions on cognitive linguistic tasks with less than 3 requests for  repeat out of 5 trials.    Time 1   Period Weeks   Status Not Met          SLP Long Term Goals - 09/04/17 1410      SLP LONG TERM GOAL #1   Title Pt will utlize external aids to manage schedule, meds, and finances with occasional min A over 2 sessionsl    Time 2   Period Weeks   Status On-going     SLP LONG TERM GOAL #2   Title Pt will alternate attention between 2 mildly complex cognitive linguistic tasks with 85% on each and occasional min A   Time 2   Period Weeks   Status On-going     SLP LONG TERM GOAL #3   Title Pt will solve mildly complex reasoning, organization, time/money problems with 85% accuracy and occasional min A.   Time 3   Period Weeks   Status On-going          Plan - 09/04/17 1410     Clinical Impression Statement Extended time for all tasks due to slow processing. Simple to mildly complex functional time and money problem solving with occasional min to mod A. Attention to detail with occasional min A. Continue skilled ST to maximize cognition for return to independent living   Speech Therapy Frequency 2x / week   Treatment/Interventions Compensatory strategies;Functional tasks;Patient/family education;Cognitive reorganization;Environmental controls;Internal/external aids;SLP instruction and feedback;Language facilitation   Potential to Achieve Goals Good      Patient will benefit from skilled therapeutic intervention in order to improve the following deficits and impairments:   Cognitive communication deficit    Problem List Patient Active Problem List   Diagnosis Date Noted  . Glucose intolerance (impaired glucose tolerance) 05/01/2017  . Depression 05/01/2017  . Hypertensive crisis   . Acute bilat watershed infarction 04/09/2017  . Intracranial vascular stenosis   . Hemiparesis affecting dominant side as late effect of stroke ([place])   . Post-operative pain   . S/P carotid endarterectomy   . Essential hypertension   . Stenosis of left carotid artery   . Hyperlipidemia   . Gait disturbance, post-stroke 04/02/2017  . Right arm weakness   . Slowness and poor responsiveness   . Hypertensive emergency 04/01/2017  . Hypokalemia 04/01/2017  . Hyperglycemia 04/01/2017  . Stroke-like symptoms 04/01/2017  . 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 ([place]) 04/01/2017    Navarre Diana, Annye Rusk MS, CCC-SLP 09/04/2017, 2:11 PM  [facility] [address] [place], [place], [address] Phone: [phone]   Fax:  [phone]   Name: [name] [name] MRN: [id number] Date of Birth: Apr 29, 1946

## 2017-09-04 NOTE — Therapy (Signed)
[facility] [address] [place], [place], [address] Phone: [phone]   Fax:  [phone]  Occupational Therapy Treatment  Patient Details  Name: [name] [name] MRN: [id number] Date of Birth: 1946/02/19 Referring Provider: Dr. Donetta Potts  Encounter Date: 09/04/2017      OT End of Session - 09/04/17 1627    Visit Number 9   Number of Visits 14   Date for OT Re-Evaluation 09/08/17   Authorization Type BC/BS MCR - G code required   Authorization Time Period At evaluation - 14 visits remaining for OT    Authorization - Visit Number 9   Authorization - Number of Visits 10   OT Start Time 1400   OT Stop Time 1445   OT Time Calculation (min) 45 min   Activity Tolerance Patient tolerated treatment well      Past Medical History:  Diagnosis Date  . Hypertension   . Stroke [facility])     Past Surgical History:  Procedure Laterality Date  . ENDARTERECTOMY Left 04/08/2017   Procedure: ENDARTERECTOMY CAROTID;  Surgeon: Waynetta Sandy, MD;  Location: [place];  Service: Vascular;  Laterality: Left;  . PATCH ANGIOPLASTY Left 04/08/2017   Procedure: PATCH ANGIOPLASTY Left Carotid;  Surgeon: Waynetta Sandy, MD;  Location: [place];  Service: Vascular;  Laterality: Left;    There were no vitals filed for this visit.      Subjective Assessment - 09/04/17 1408    Subjective  I'm using my Rt hand more   Pertinent History bilateral watershed infarction (Lt side worse than Rt) 04/01/17, Lt carotid endarterectomy 04/08/17, PMH: chronic [place] vessel dz, h/o smaller strokes   Limitations no driving   Patient Stated Goals return to cooking and get my Rt hand better   Currently in Pain? No/denies                      OT Treatments/Exercises (OP) - 09/04/17 0001      ADLs   Cooking Pt making grilled cheese over stove demo good safety of Rt hand and remembered to turn off stove. However, pt did not  perform task efficiently and make multiple trips back and forth b/c of decreased planning ahead and sequencing.    Home Maintenance Pt made bed with extra time, and decreased efficiency/extra steps but no LOB at mod I level. Pt sweeping with extra time                  OT Short Term Goals - 09/02/17 1408      OT SHORT TERM GOAL #1   Title Independent with coordination and putty HEP    Status Achieved     OT SHORT TERM GOAL #2   Title Pt to tie shoes or able to wear tie up shoes with A/E prn   Status Achieved     OT SHORT TERM GOAL #3   Title pt to safely perform tub/shower transfer with DME prn   Status Achieved     OT SHORT TERM GOAL #4   Title Pt to perform simple cold meal, microwaveable meals, and snack prep mod I level consistently    Status Achieved     OT SHORT TERM GOAL #5   Title Pt to consistently participate in light household tasks (folding clothes, washing dishes)   Status Achieved           OT Long Term Goals - 09/02/17 1409  OT LONG TERM GOAL #1   Title Independent with updated HEP    Time 7   Period Weeks   Status New     OT LONG TERM GOAL #2   Title Pt to improve coordination Rt hand as evidenced by reducing speed on 9 hole peg test to 38 sec. or under   Baseline eval: 50 sec   Time 7   Period Weeks   Status On-going     OT LONG TERM GOAL #3   Title Pt to improve grip strength Rt dominant hand to 25 lbs or greater to assist with opening jars/containers   Baseline eval: 10 lbs (Lt = 58 lbs)    Time 7   Period Weeks   Status On-going     OT LONG TERM GOAL #4   Title Pt to retrieve/replace 2 lb. object from high level shelf RUE with no reports of pain   Time 7   Period Weeks   Status On-going     OT LONG TERM GOAL #5   Title Pt to make simple meal mod I level safely   Time 7   Period Weeks   Status On-going  currently at sup level with min cues     OT LONG TERM GOAL #6   Title Pt/family to verbalize understanding with  necessary DME, modifications, and external aids/devices to put in place if pt were to return to living alone   Time 7   Period Weeks   Status New               Plan - 09/04/17 1628    Clinical Impression Statement Pt progressing with simple cooking and IADL tasks. Pt safe with tasks but requires extra time.    Rehab Potential Good   Current Impairments/barriers affecting progress: chronic [place] vessel disease making her more prone to future strokes   OT Frequency 2x / week   OT Duration --  7 weeks   OT Treatment/Interventions Self-care/ADL training;Moist Heat;DME and/or AE instruction;Splinting;Patient/family education;Therapeutic exercises;Therapeutic activities;Passive range of motion;Neuromuscular education;Functional Mobility Training;Manual Therapy;Visual/perceptual remediation/compensation;Cognitive remediation/compensation   Plan Will need renewal and G-code next session. (LTG's still appropriate).    Consulted and Agree with Plan of Care Patient      Patient will benefit from skilled therapeutic intervention in order to improve the following deficits and impairments:  Decreased coordination, Decreased range of motion, Impaired flexibility, Decreased safety awareness, Decreased endurance, Impaired sensation, Decreased knowledge of precautions, Impaired UE functional use, Pain, Decreased mobility, Decreased strength, Decreased cognition, Impaired vision/preception  Visit Diagnosis: Hemiplegia and hemiparesis following cerebral infarction affecting right dominant side (HCC)  Muscle weakness (generalized)  Other symptoms and signs involving cognitive functions following cerebral infarction    Problem List Patient Active Problem List   Diagnosis Date Noted  . Glucose intolerance (impaired glucose tolerance) 05/01/2017  . Depression 05/01/2017  . Hypertensive crisis   . Acute bilat watershed infarction 04/09/2017  . Intracranial vascular stenosis   . Hemiparesis  affecting dominant side as late effect of stroke ([place])   . Post-operative pain   . S/P carotid endarterectomy   . Essential hypertension   . Stenosis of left carotid artery   . Hyperlipidemia   . Gait disturbance, post-stroke 04/02/2017  . Right arm weakness   . Slowness and poor responsiveness   . Hypertensive emergency 04/01/2017  . Hypokalemia 04/01/2017  . Hyperglycemia 04/01/2017  . Stroke-like symptoms 04/01/2017  . 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 (  [place]) 04/01/2017    Carey Bullocks, OTR/L 09/04/2017, 4:30 PM  [facility] [address] [place], [place], [address] Phone: [phone]   Fax:  [phone]  Name: [name] [name] MRN: [id number] Date of Birth: 1946-07-22

## 2017-09-09 ENCOUNTER — Ambulatory Visit: Payer: BLUE CROSS/BLUE SHIELD | Admitting: Occupational Therapy

## 2017-09-09 DIAGNOSIS — R2681 Unsteadiness on feet: Secondary | ICD-10-CM

## 2017-09-09 DIAGNOSIS — I69318 Other symptoms and signs involving cognitive functions following cerebral infarction: Secondary | ICD-10-CM

## 2017-09-09 DIAGNOSIS — R278 Other lack of coordination: Secondary | ICD-10-CM

## 2017-09-09 DIAGNOSIS — I69351 Hemiplegia and hemiparesis following cerebral infarction affecting right dominant side: Secondary | ICD-10-CM

## 2017-09-09 DIAGNOSIS — M6281 Muscle weakness (generalized): Secondary | ICD-10-CM

## 2017-09-09 DIAGNOSIS — R482 Apraxia: Secondary | ICD-10-CM

## 2017-09-09 DIAGNOSIS — R41841 Cognitive communication deficit: Secondary | ICD-10-CM | POA: Diagnosis not present

## 2017-09-09 NOTE — Therapy (Signed)
[facility] [address] [place], [place], [address] Phone: [phone]   Fax:  [phone]  Occupational Therapy Treatment  Patient Details  Name: [name] [name] MRN: [id number] Date of Birth: 1946/06/24 Referring Provider: Dr. Donetta Potts  Encounter Date: 09/09/2017      OT End of Session - 09/09/17 1406    Visit Number 10   Number of Visits 14   Date for OT Re-Evaluation 10/09/17   Authorization Type BC/BS MCR - G code required   Authorization Time Period At evaluation - 14 visits remaining for OT    Authorization - Visit Number 10   Authorization - Number of Visits 10   OT Start Time 1315   OT Stop Time 1400   OT Time Calculation (min) 45 min   Activity Tolerance Patient tolerated treatment well      Past Medical History:  Diagnosis Date  . Hypertension   . Stroke [facility])     Past Surgical History:  Procedure Laterality Date  . ENDARTERECTOMY Left 04/08/2017   Procedure: ENDARTERECTOMY CAROTID;  Surgeon: Waynetta Sandy, MD;  Location: [place];  Service: Vascular;  Laterality: Left;  . PATCH ANGIOPLASTY Left 04/08/2017   Procedure: PATCH ANGIOPLASTY Left Carotid;  Surgeon: Waynetta Sandy, MD;  Location: [place];  Service: Vascular;  Laterality: Left;    There were no vitals filed for this visit.      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 No/denies            [facility] OT Assessment - 09/09/17 0001      Coordination   Right 9 Hole Peg Test 49.88 sec     Hand Function   Right Hand Grip (lbs) 25 lbs                  OT  Treatments/Exercises (OP) - 09/09/17 0001      ADLs   Writing Practiced writing, but deficits appeared to be more with expressive aphasia than coordination. Pt holding pen correctly and had good control of pen. Pt writing name in print at 100% legibility, but difficulty formulating letters in cursive   ADL Comments Re-assessed coordination and grip strength. Pt has improved in grip strength, but coordination only improved by 1 sec. However, functionally Rt hand has improved - pt now tying shoes, hooking buttons.      Fine Motor Coordination   Fine Motor Coordination Anderegg Pegboard   Treiber Pegboard Pt placing [place]man pegs in pegboard Rt hand with min difficulty/drops while copying peg design. Pt required extra time, and min cues/questioning cues to copy design correctly                  OT Short Term Goals - 09/09/17 1407      OT SHORT TERM GOAL #1   Title Independent with coordination and putty HEP    Status Achieved     OT SHORT TERM GOAL #2   Title Pt to tie shoes or able to wear tie up shoes with A/E prn   Status Achieved     OT SHORT TERM GOAL #3  Title pt to safely perform tub/shower transfer with DME prn   Status Achieved     OT SHORT TERM GOAL #4   Title Pt to perform simple cold meal, microwaveable meals, and snack prep mod I level consistently    Status Achieved     OT SHORT TERM GOAL #5   Title Pt to consistently participate in light household tasks (folding clothes, washing dishes)   Status Achieved           OT Long Term Goals - 09/02/17 1409      OT LONG TERM GOAL #1   Title Independent with updated HEP    Time 7   Period Weeks   Status New     OT LONG TERM GOAL #2   Title Pt to improve coordination Rt hand as evidenced by reducing speed on 9 hole peg test to 38 sec. or under   Baseline eval: 50 sec   Time 7   Period Weeks   Status On-going     OT LONG TERM GOAL #3   Title Pt to improve grip strength Rt dominant hand to 25 lbs or greater to  assist with opening jars/containers   Baseline eval: 10 lbs (Lt = 58 lbs)    Time 7   Period Weeks   Status On-going     OT LONG TERM GOAL #4   Title Pt to retrieve/replace 2 lb. object from high level shelf RUE with no reports of pain   Time 7   Period Weeks   Status On-going     OT LONG TERM GOAL #5   Title Pt to make simple meal mod I level safely   Time 7   Period Weeks   Status On-going  currently at sup level with min cues     OT LONG TERM GOAL #6   Title Pt/family to verbalize understanding with necessary DME, modifications, and external aids/devices to put in place if pt were to return to living alone   Time 7   Period Weeks   Status New               Plan - 09/09/17 1407    Clinical Impression Statement Re-certification sent to MD today due to [facility] time constraints. LTG's still appropriate and continue with POC   Rehab Potential Good   Current Impairments/barriers affecting progress: chronic [place] vessel disease making her more prone to future strokes   OT Frequency 2x / week   OT Duration --  7 weeks   OT Treatment/Interventions Self-care/ADL training;Moist Heat;DME and/or AE instruction;Splinting;Patient/family education;Therapeutic exercises;Therapeutic activities;Passive range of motion;Neuromuscular education;Functional Mobility Training;Manual Therapy;Visual/perceptual remediation/compensation;Cognitive remediation/compensation   Plan continue coordination, grip strength and RUE function. Also asked pt to have daughter come in, in prep for d/c from therapy to go over recommendations   Consulted and Agree with Plan of Care Patient      Patient will benefit from skilled therapeutic intervention in order to improve the following deficits and impairments:  Decreased coordination, Decreased range of motion, Impaired flexibility, Decreased safety awareness, Decreased endurance, Impaired sensation, Decreased knowledge of precautions, Impaired UE functional use,  Pain, Decreased mobility, Decreased strength, Decreased cognition, Impaired vision/preception  Visit Diagnosis: Hemiplegia and hemiparesis following cerebral infarction affecting right dominant side ([place]) - Plan: Ot plan of care cert/re-cert  Muscle weakness (generalized) - Plan: Ot plan of care cert/re-cert  Other lack of coordination - Plan: Ot plan of care cert/re-cert  Apraxia - Plan: Ot plan of care cert/re-cert  Other symptoms and signs involving cognitive functions following cerebral infarction - Plan: Ot plan of care cert/re-cert  Unsteadiness on feet - Plan: Ot plan of care cert/re-cert      G-Codes - 49/67/59 1411    Functional Assessment Tool Used (Outpatient only) RUE: based on clinical findings during eval   Functional Limitation Carrying, moving and handling objects   Carrying, Moving and Handling Objects Current Status (F6384) At least 20 percent but less than 40 percent impaired, limited or restricted   Carrying, Moving and Handling Objects Goal Status (Y6599) At least 1 percent but less than 20 percent impaired, limited or restricted      Problem List Patient Active Problem List   Diagnosis Date Noted  . Glucose intolerance (impaired glucose tolerance) 05/01/2017  . Depression 05/01/2017  . Hypertensive crisis   . Acute bilat watershed infarction 04/09/2017  . Intracranial vascular stenosis   . Hemiparesis affecting dominant side as late effect of stroke ([place])   . Post-operative pain   . S/P carotid endarterectomy   . Essential hypertension   . Stenosis of left carotid artery   . Hyperlipidemia   . Gait disturbance, post-stroke 04/02/2017  . Right arm weakness   . Slowness and poor responsiveness   . Hypertensive emergency 04/01/2017  . Hypokalemia 04/01/2017  . Hyperglycemia 04/01/2017  . Stroke-like symptoms 04/01/2017  . 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 To maximize function, increase RUE function and coordination, and safety with IADLS  Carey Bullocks, OTR/L 09/09/2017, 2:14 PM  [facility] [address] [place], [place], [address] Phone: [phone]   Fax:  [phone]  Name: [name] [name] MRN: [id number] Date of Birth: Jan 14, 1946

## 2017-09-10 ENCOUNTER — Telehealth: Payer: Self-pay | Admitting: Family Medicine

## 2017-09-10 ENCOUNTER — Other Ambulatory Visit: Payer: Self-pay

## 2017-09-10 DIAGNOSIS — I10 Essential (primary) hypertension: Secondary | ICD-10-CM

## 2017-09-10 MED ORDER — AMLODIPINE BESYLATE 10 MG PO TABS: 10.0000 mg | ORAL_TABLET | Freq: Every day | ORAL | 0 refills | Status: DC

## 2017-09-10 NOTE — Telephone Encounter (Signed)
Pharmacy sent refill request for medication, per last refill patient needs a follow up appointment.  Per office policy sent in 15 day supply and patient will be contacted to make a follow up appointment.  MPulliam, CMA/RT(R)

## 2017-09-10 NOTE — Telephone Encounter (Signed)
Called [phone] left message for [name] [name] to contact office to set up OV for [name] [name]-Provider required OV in 15 dys from 10/31--glh

## 2017-09-11 ENCOUNTER — Ambulatory Visit: Payer: BLUE CROSS/BLUE SHIELD | Attending: Physical Medicine & Rehabilitation | Admitting: Occupational Therapy

## 2017-09-11 DIAGNOSIS — R482 Apraxia: Secondary | ICD-10-CM | POA: Diagnosis present

## 2017-09-11 DIAGNOSIS — I69351 Hemiplegia and hemiparesis following cerebral infarction affecting right dominant side: Secondary | ICD-10-CM | POA: Diagnosis present

## 2017-09-11 DIAGNOSIS — M6281 Muscle weakness (generalized): Secondary | ICD-10-CM | POA: Diagnosis present

## 2017-09-11 DIAGNOSIS — R278 Other lack of coordination: Secondary | ICD-10-CM | POA: Diagnosis present

## 2017-09-11 DIAGNOSIS — I69318 Other symptoms and signs involving cognitive functions following cerebral infarction: Secondary | ICD-10-CM | POA: Diagnosis present

## 2017-09-11 NOTE — Therapy (Signed)
[facility] [address] [place], [place], [address] Phone: [phone]   Fax:  [phone]  Occupational Therapy Treatment  Patient Details  Name: [name] [name] MRN: [id number] Date of Birth: 1946-07-22 Referring Provider: Dr. Donetta Potts  Encounter Date: 09/11/2017      OT End of Session - 09/11/17 1351    Visit Number 11   Number of Visits 14   Date for OT Re-Evaluation 10/09/17   Authorization Type BC/BS MCR - G code required   Authorization Time Period At evaluation - 14 visits remaining for OT    Authorization - Visit Number 11   Authorization - Number of Visits 20   OT Start Time 8466   OT Stop Time 1355   OT Time Calculation (min) 40 min   Activity Tolerance Patient tolerated treatment well      Past Medical History:  Diagnosis Date  . Hypertension   . Stroke [facility])     Past Surgical History:  Procedure Laterality Date  . ENDARTERECTOMY Left 04/08/2017   Procedure: ENDARTERECTOMY CAROTID;  Surgeon: Waynetta Sandy, MD;  Location: [place];  Service: Vascular;  Laterality: Left;  . PATCH ANGIOPLASTY Left 04/08/2017   Procedure: PATCH ANGIOPLASTY Left Carotid;  Surgeon: Waynetta Sandy, MD;  Location: [place];  Service: Vascular;  Laterality: Left;    There were no vitals filed for this visit.      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 No/denies                      OT Treatments/Exercises (OP) - 09/11/17 0001      Exercises   Exercises Shoulder     Shoulder Exercises: ROM/Strengthening   UBE  (Upper Arm Bike) UBE x 8 min. level 3 resistance - pt unable to get up to 35 RPM as requested. Pt approx. 25-30 rpm     Hand Exercises   Other Hand Exercises Gripper set at level 2 resistance to pick up blocks for sustained grip strength Rt hand with mod difficulty and increased drops as pt fatigued. Two rest breaks extra time required     Fine Motor Coordination   [place]ner Pegboard Pt placing grooved pegs in pegboard Rt hand with only little difficulty in reasonable amount of time     Functional Reaching Activities   High Level Removing and replacing 10 cones on high shelf RUE with 2 lb. wrist weight.                   OT Short Term Goals - 09/09/17 1407      OT SHORT TERM GOAL #1   Title Independent with coordination and putty HEP    Status Achieved     OT SHORT TERM GOAL #2   Title Pt to tie shoes or able to wear tie up shoes with A/E prn   Status Achieved     OT SHORT TERM GOAL #3   Title pt to safely perform tub/shower transfer with DME prn   Status Achieved     OT SHORT  TERM GOAL #4   Title Pt to perform simple cold meal, microwaveable meals, and snack prep mod I level consistently    Status Achieved     OT SHORT TERM GOAL #5   Title Pt to consistently participate in light household tasks (folding clothes, washing dishes)   Status Achieved           OT Long Term Goals - 09/02/17 1409      OT LONG TERM GOAL #1   Title Independent with updated HEP    Time 7   Period Weeks   Status New     OT LONG TERM GOAL #2   Title Pt to improve coordination Rt hand as evidenced by reducing speed on 9 hole peg test to 38 sec. or under   Baseline eval: 50 sec   Time 7   Period Weeks   Status On-going     OT LONG TERM GOAL #3   Title Pt to improve grip strength Rt dominant hand to 25 lbs or greater to assist with opening jars/containers   Baseline eval: 10 lbs (Lt = 58 lbs)    Time 7   Period Weeks   Status On-going     OT LONG TERM GOAL #4   Title Pt to  retrieve/replace 2 lb. object from high level shelf RUE with no reports of pain   Time 7   Period Weeks   Status On-going     OT LONG TERM GOAL #5   Title Pt to make simple meal mod I level safely   Time 7   Period Weeks   Status On-going  currently at sup level with min cues     OT LONG TERM GOAL #6   Title Pt/family to verbalize understanding with necessary DME, modifications, and external aids/devices to put in place if pt were to return to living alone   Time 7   Period Weeks   Status New               Plan - 09/11/17 1352    Clinical Impression Statement Pt gradually increasing functional use of RUE in clinic, however unsure of carryover at home. Pt with flat affect and slowness of movement as well.    Rehab Potential Good   Current Impairments/barriers affecting progress: chronic [place] vessel disease making her more prone to future strokes   OT Frequency 2x / week   OT Duration --  7 weeks   OT Treatment/Interventions Self-care/ADL training;Moist Heat;DME and/or AE instruction;Splinting;Patient/family education;Therapeutic exercises;Therapeutic activities;Passive range of motion;Neuromuscular education;Functional Mobility Training;Manual Therapy;Visual/perceptual remediation/compensation;Cognitive remediation/compensation   Plan progress towards remaining LTG's, pt's daughter may come in next week to review recommendations/safety considerations if pt was to return to living alone, in prep for d/c   Consulted and Agree with Plan of Care Patient      Patient will benefit from skilled therapeutic intervention in order to improve the following deficits and impairments:  Decreased coordination, Decreased range of motion, Impaired flexibility, Decreased safety awareness, Decreased endurance, Impaired sensation, Decreased knowledge of precautions, Impaired UE functional use, Pain, Decreased mobility, Decreased strength, Decreased cognition, Impaired vision/preception  Visit  Diagnosis: Hemiplegia and hemiparesis following cerebral infarction affecting right dominant side (HCC)  Other lack of coordination  Apraxia  Muscle weakness (generalized)    Problem List Patient Active Problem List   Diagnosis Date Noted  . Glucose intolerance (impaired glucose tolerance) 05/01/2017  . Depression 05/01/2017  . Hypertensive crisis   . Acute bilat watershed infarction  04/09/2017  . Intracranial vascular stenosis   . Hemiparesis affecting dominant side as late effect of stroke ([place])   . Post-operative pain   . S/P carotid endarterectomy   . Essential hypertension   . Stenosis of left carotid artery   . Hyperlipidemia   . Gait disturbance, post-stroke 04/02/2017  . Right arm weakness   . Slowness and poor responsiveness   . Hypertensive emergency 04/01/2017  . Hypokalemia 04/01/2017  . Hyperglycemia 04/01/2017  . Stroke-like symptoms 04/01/2017  . 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 ([place]) 04/01/2017    Carey Bullocks, OTR/L 09/11/2017, 2:00 PM  [facility] [address] [place], [place], [address] Phone: [phone]   Fax:  [phone]  Name: Aaliya Maultsby MRN: [id number] Date of Birth: Mar 23, 1946

## 2017-09-16 ENCOUNTER — Encounter: Payer: BLUE CROSS/BLUE SHIELD | Admitting: Occupational Therapy

## 2017-09-18 ENCOUNTER — Ambulatory Visit: Payer: BLUE CROSS/BLUE SHIELD | Admitting: Occupational Therapy

## 2017-09-18 DIAGNOSIS — I69351 Hemiplegia and hemiparesis following cerebral infarction affecting right dominant side: Secondary | ICD-10-CM

## 2017-09-18 DIAGNOSIS — R278 Other lack of coordination: Secondary | ICD-10-CM

## 2017-09-18 DIAGNOSIS — M6281 Muscle weakness (generalized): Secondary | ICD-10-CM

## 2017-09-18 NOTE — Therapy (Signed)
[facility] [address] [place], [place], [address] Phone: [phone]   Fax:  [phone]  Occupational Therapy Treatment  Patient Details  Name: [name] [name] MRN: [id number] Date of Birth: 02-Oct-1946 Referring Provider: Dr. Donetta Potts   Encounter Date: 09/18/2017  OT End of Session - 09/18/17 1329    Visit Number  12    Number of Visits  14    Date for OT Re-Evaluation  10/09/17    Authorization Type  BC/BS MCR - G code required    Authorization Time Period  At evaluation - 14 visits remaining for OT     Authorization - Visit Number  12    Authorization - Number of Visits  20    OT Start Time  5573    OT Stop Time  1400    OT Time Calculation (min)  43 min    Activity Tolerance  Patient tolerated treatment well    Behavior During Therapy  [facility] for tasks assessed/performed       Past Medical History:  Diagnosis Date  . Hypertension   . Stroke [facility])     No past surgical history on file.  There were no vitals filed for this visit.  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  No/denies             Treatment: Standing to perfrom functional midrange reaching with RUE with 1lbs weight on wrist to copy Frenette peg design, mod v.c for correct design, min drops, increased time required Standing to place and remove graded clothes pins from vertical antennae with RUE for increased sustained pinch, min difficulty/ v.c                OT Short Term Goals - 09/09/17 1407      OT SHORT TERM GOAL #1   Title  Independent with coordination and putty HEP     Status  Achieved      OT SHORT TERM GOAL #2   Title  Pt to  tie shoes or able to wear tie up shoes with A/E prn    Status  Achieved      OT SHORT TERM GOAL #3   Title  pt to safely perform tub/shower transfer with DME prn    Status  Achieved      OT SHORT TERM GOAL #4   Title  Pt to perform simple cold meal, microwaveable meals, and snack prep mod I level consistently     Status  Achieved      OT SHORT TERM GOAL #5   Title  Pt to consistently participate in light household tasks (folding clothes, washing dishes)    Status  Achieved        OT Long Term Goals - 09/02/17 1409      OT LONG TERM GOAL #1   Title  Independent with updated HEP     Time  7    Period  Weeks    Status  New      OT LONG TERM GOAL #2   Title  Pt to improve coordination Rt hand as evidenced by reducing speed on 9 hole peg  test to 38 sec. or under    Baseline  eval: 50 sec    Time  7    Period  Weeks    Status  On-going      OT LONG TERM GOAL #3   Title  Pt to improve grip strength Rt dominant hand to 25 lbs or greater to assist with opening jars/containers    Baseline  eval: 10 lbs (Lt = 58 lbs)     Time  7    Period  Weeks    Status  On-going      OT LONG TERM GOAL #4   Title  Pt to retrieve/replace 2 lb. object from high level shelf RUE with no reports of pain    Time  7    Period  Weeks    Status  On-going      OT LONG TERM GOAL #5   Title  Pt to make simple meal mod I level safely    Time  7    Period  Weeks    Status  On-going currently at sup level with min cues      OT LONG TERM GOAL #6   Title  Pt/family to verbalize understanding with necessary DME, modifications, and external aids/devices to put in place if pt were to return to living alone    Time  7    Period  Weeks    Status  New            Plan - 09/18/17 1330    Clinical Impression Statement  Pt is progressing towards goals. She demonstrates improving LUE fine motor coordination and functional reach.    Rehab Potential  Good    Current Impairments/barriers affecting  progress:  chronic [place] vessel disease making her more prone to future strokes    OT Frequency  2x / week    OT Duration  -- 7 weeks    OT Treatment/Interventions  Self-care/ADL training;Moist Heat;DME and/or AE instruction;Splinting;Patient/family education;Therapeutic exercises;Therapeutic activities;Passive range of motion;Neuromuscular education;Functional Mobility Training;Manual Therapy;Visual/perceptual remediation/compensation;Cognitive remediation/compensation    Plan  continue to progress towards long term goals, Pt / family education next week with pt's daughter    Consulted and Agree with Plan of Care  Patient       Patient will benefit from skilled therapeutic intervention in order to improve the following deficits and impairments:  Decreased coordination, Decreased range of motion, Impaired flexibility, Decreased safety awareness, Decreased endurance, Impaired sensation, Decreased knowledge of precautions, Impaired UE functional use, Pain, Decreased mobility, Decreased strength, Decreased cognition, Impaired vision/preception  Visit Diagnosis: Hemiplegia and hemiparesis following cerebral infarction affecting right dominant side (HCC)  Other lack of coordination  Muscle weakness (generalized)    Problem List Patient Active Problem List   Diagnosis Date Noted  . Glucose intolerance (impaired glucose tolerance) 05/01/2017  . Depression 05/01/2017  . Hypertensive crisis   . Acute bilat watershed infarction 04/09/2017  . Intracranial vascular stenosis   . Hemiparesis affecting dominant side as late effect of stroke ([place])   . Post-operative pain   . S/P carotid endarterectomy   . Essential hypertension   . Stenosis of left carotid artery   . Hyperlipidemia   . Gait disturbance, post-stroke 04/02/2017  . Right arm weakness   . Slowness and poor responsiveness   . Hypertensive emergency 04/01/2017  . Hypokalemia 04/01/2017  . Hyperglycemia 04/01/2017  . Stroke-like  symptoms 04/01/2017  . 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 ([place]) 04/01/2017  Neo Yepiz 09/18/2017, 1:33 PM  [facility] [address] [place], [place], [address] Phone: [phone]   Fax:  [phone]  Name: Luella Gardenhire MRN: [id number] Date of Birth: 1945/11/17

## 2017-09-23 ENCOUNTER — Ambulatory Visit: Payer: BLUE CROSS/BLUE SHIELD | Admitting: Occupational Therapy

## 2017-09-23 DIAGNOSIS — I69351 Hemiplegia and hemiparesis following cerebral infarction affecting right dominant side: Secondary | ICD-10-CM | POA: Diagnosis not present

## 2017-09-23 DIAGNOSIS — R482 Apraxia: Secondary | ICD-10-CM

## 2017-09-23 DIAGNOSIS — I69318 Other symptoms and signs involving cognitive functions following cerebral infarction: Secondary | ICD-10-CM

## 2017-09-23 DIAGNOSIS — R278 Other lack of coordination: Secondary | ICD-10-CM

## 2017-09-23 NOTE — Therapy (Signed)
[facility] [address] [place], [place], [address] Phone: [phone]   Fax:  [phone]  Occupational Therapy Treatment  Patient Details  Name: [name] [name] MRN: [id number] Date of Birth: 02-16-1946 Referring Provider: Dr. Donetta Potts   Encounter Date: 09/23/2017  OT End of Session - 09/23/17 1407    Visit Number  13    Number of Visits  14    Date for OT Re-Evaluation  10/09/17    Authorization Type  BC/BS MCR - G code required    Authorization Time Period  At evaluation - 14 visits remaining for OT     Authorization - Visit Number  13    Authorization - Number of Visits  20    OT Start Time  1315    OT Stop Time  1400    OT Time Calculation (min)  45 min    Activity Tolerance  Patient tolerated treatment well       Past Medical History:  Diagnosis Date  . Hypertension   . Stroke [facility])     No past surgical history on file.  There were no vitals filed for this visit.  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  No/denies                   OT Treatments/Exercises (OP) - 09/23/17 0001      ADLs   ADL Comments  Discussed recommendations and A/E with pt/daughter if pt were to return to living alone. Recommended/reviewed things from 09/02/17 session. Pt NOT to drive and still have supervision for cooking meals, but feel pt can safety heat a familiar one step dish on stove at mod I level (heating up soup, cooking an egg). Recommended discussing with MD most recent MRI results of brain with pt/daughter and implications of this for future. Also assessed remaining LTG's and  progress to date with patient/daughter. Pt was offered another session this week, but politely refused feeling she was ready to be d/c today. (Therapist already planned on d/c this week)      Hand Exercises   Other Hand Exercises  Gripper set at level 2 resistance to pick up blocks for sustained grip strength Rt hand with mod difficulty and increased drops as pt fatigued. Two rest breaks and extra time required    Other Hand Exercises  Pt placed graded clothespins on antenna and removing for pinch strength Rt hand (red, green and blue resistance)                OT Short Term Goals - 09/09/17 1407      OT SHORT TERM GOAL #1   Title  Independent with coordination and putty HEP     Status  Achieved      OT SHORT TERM GOAL #2   Title  Pt to tie shoes or able to wear tie up shoes with A/E prn    Status  Achieved      OT SHORT TERM GOAL #3   Title  pt to safely perform tub/shower transfer with DME prn    Status  Achieved      OT SHORT TERM GOAL #4   Title  Pt to perform simple cold meal, microwaveable meals, and snack prep mod I level consistently     Status  Achieved      OT SHORT TERM GOAL #5   Title  Pt to consistently participate in light household tasks (folding clothes, washing dishes)    Status  Achieved        OT Long Term Goals - 09/23/17 1408      OT LONG TERM GOAL #1   Title  Independent with updated HEP     Time  7    Period  Weeks    Status  Deferred no longer needed      OT LONG TERM GOAL #2   Title  Pt to improve coordination Rt hand as evidenced by reducing speed on 9 hole peg test to 38 sec. or under    Baseline  eval: 50 sec    Time  7    Period  Weeks    Status  Not Met 09/23/17: 46 sec. (slow d/t apraxia vs. coordination)      OT LONG TERM GOAL #3   Title  Pt to improve grip strength Rt dominant hand to 25 lbs or greater to assist with opening jars/containers    Baseline  eval: 10 lbs (Lt = 58 lbs)     Time  7    Period  Weeks    Status  Not  Met 09/23/17: 18 lbs      OT LONG TERM GOAL #4   Title  Pt to retrieve/replace 2 lb. object from high level shelf RUE with no reports of pain    Time  7    Period  Weeks    Status  Achieved      OT LONG TERM GOAL #5   Title  Pt to make simple meal mod I level safely    Time  7    Period  Weeks    Status  Partially Met met with simple, familiar, one dish over stove. But needs sup/cues for anything else stovetop      OT LONG TERM GOAL #6   Title  Pt/family to verbalize understanding with necessary DME, modifications, and external aids/devices to put in place if pt were to return to living alone    Time  7    Period  Weeks    Status  Achieved            Plan - 09/23/17 1410    Clinical Impression Statement  Pt has made gradual progress with functional tasks and RUE. Specifics recommendations given today if pt were to return to living alone (daughter also to continue providing transportation to appts and for groceries, supervision with cooking/or providing meals, and doing financial management)     Rehab Potential  Good    Current Impairments/barriers affecting progress:  chronic [place] vessel disease making her more prone to future strokes    OT Frequency  2x / week    OT Duration  -- 7 weeks    OT Treatment/Interventions  Self-care/ADL training;Moist Heat;DME and/or AE instruction;Splinting;Patient/family education;Therapeutic exercises;Therapeutic activities;Passive range of motion;Neuromuscular education;Functional Mobility Training;Manual Therapy;Visual/perceptual remediation/compensation;Cognitive remediation/compensation    Plan  D/C O.Donnajean Lopes and Agree with Plan of Care  Patient;Family member/caregiver    Family Member Consulted  Daughter       Patient will benefit from skilled therapeutic intervention in order to improve  the following deficits and impairments:  Decreased coordination, Decreased range of motion, Impaired flexibility, Decreased safety awareness,  Decreased endurance, Impaired sensation, Decreased knowledge of precautions, Impaired UE functional use, Pain, Decreased mobility, Decreased strength, Decreased cognition, Impaired vision/preception  Visit Diagnosis: Hemiplegia and hemiparesis following cerebral infarction affecting right dominant side (HCC)  Other lack of coordination  Apraxia  Other symptoms and signs involving cognitive functions following cerebral infarction  G-Codes - 05-Oct-2017 1413    Functional Assessment Tool Used (Outpatient only)  RUE function based on ADLS/IADLS    Functional Limitation  Carrying, moving and handling objects    Carrying, Moving and Handling Objects Goal Status (H8527)  At least 1 percent but less than 20 percent impaired, limited or restricted    Carrying, Moving and Handling Objects Discharge Status [phone])  At least 1 percent but less than 20 percent impaired, limited or restricted       Problem List Patient Active Problem List   Diagnosis Date Noted  . Glucose intolerance (impaired glucose tolerance) 05/01/2017  . Depression 05/01/2017  . Hypertensive crisis   . Acute bilat watershed infarction 04/09/2017  . Intracranial vascular stenosis   . Hemiparesis affecting dominant side as late effect of stroke ([place])   . Post-operative pain   . S/P carotid endarterectomy   . Essential hypertension   . Stenosis of left carotid artery   . Hyperlipidemia   . Gait disturbance, post-stroke 04/02/2017  . Right arm weakness   . Slowness and poor responsiveness   . Hypertensive emergency 04/01/2017  . Hypokalemia 04/01/2017  . Hyperglycemia 04/01/2017  . Stroke-like symptoms 04/01/2017  . 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 ([place]) 04/01/2017   OCCUPATIONAL THERAPY DISCHARGE SUMMARY  Visits from Start of Care: 13  Current functional level related to goals / functional outcomes: See above   Remaining deficits: Decreased coordination and strength  RUE Apraxia Cognition including decreased processing speed, executive functioning skills (specifically sequencing, planning ahead, organization)    Education / Equipment: HEP's, safety recommendations, A/E recommendations and/or task modifications  Plan: Patient agrees to discharge.  Patient goals were partially met. Patient is being discharged due to being pleased with the current functional level.  ?????        Carey Bullocks, OTR/L 05-Oct-2017, 2:14 PM  [facility] [address] [place], [place], [address] Phone: [phone]   Fax:  [phone]  Name: [name] [name] MRN: [id number] Date of Birth: [birth year]

## 2017-10-28 ENCOUNTER — Telehealth: Payer: Self-pay | Admitting: Family Medicine

## 2017-10-28 NOTE — Telephone Encounter (Signed)
Left 2nd voicemail message for [name] [name] daughter to contact office for provider required office appointment. --glh

## 2017-10-29 ENCOUNTER — Encounter
Payer: BLUE CROSS/BLUE SHIELD | Attending: Physical Medicine & Rehabilitation | Admitting: Physical Medicine & Rehabilitation

## 2017-10-31 ENCOUNTER — Ambulatory Visit (HOSPITAL_COMMUNITY)
Admission: RE | Admit: 2017-10-31 | Discharge: 2017-10-31 | Disposition: A | Discharge status: Home / Self Care | Payer: BLUE CROSS/BLUE SHIELD | Source: Ambulatory Visit | Attending: Vascular Surgery | Admitting: Vascular Surgery

## 2017-10-31 ENCOUNTER — Ambulatory Visit (INDEPENDENT_AMBULATORY_CARE_PROVIDER_SITE_OTHER): Payer: BLUE CROSS/BLUE SHIELD | Admitting: Family

## 2017-10-31 ENCOUNTER — Encounter: Payer: Self-pay | Admitting: Family

## 2017-10-31 VITALS — BP 168/81 | HR 100 | Temp 97.7°F | Resp 19 | Wt 153.8 lb

## 2017-10-31 DIAGNOSIS — Z9889 Other specified postprocedural states: Secondary | ICD-10-CM

## 2017-10-31 DIAGNOSIS — I6523 Occlusion and stenosis of bilateral carotid arteries: Secondary | ICD-10-CM | POA: Diagnosis not present

## 2017-10-31 DIAGNOSIS — I6522 Occlusion and stenosis of left carotid artery: Secondary | ICD-10-CM

## 2017-10-31 DIAGNOSIS — I63232 Cerebral infarction due to unspecified occlusion or stenosis of left carotid arteries: Secondary | ICD-10-CM

## 2017-10-31 LAB — VAS US CAROTID
LEFT ECA DIAS: -22 cm/s
LEFT VERTEBRAL DIAS: 17 cm/s
LICADDIAS: -35 cm/s
LICAPDIAS: -23 cm/s
Left CCA dist dias: -18 cm/s
Left CCA dist sys: -100 cm/s
Left CCA prox dias: 23 cm/s
Left CCA prox sys: 120 cm/s
Left ICA dist sys: -129 cm/s
Left ICA prox sys: -120 cm/s
RCCADSYS: -79 cm/s
RIGHT CCA MID DIAS: 21 cm/s
RIGHT ECA DIAS: -26 cm/s
RIGHT VERTEBRAL DIAS: 15 cm/s
Right CCA prox dias: 21 cm/s
Right CCA prox sys: 181 cm/s

## 2017-10-31 NOTE — Progress Notes (Signed)
Chief Complaint: Follow up Extracranial Carotid Artery Stenosis   History of Present Illness  [name] [name] is a 71 y.o. female who is s/p left carotid endarterectomy on 04-08-18 by Dr. Donzetta Matters for symptomatic disease.  She had a stroke and was exhibiting right upper extremity paralysis as well as right lower extremity weakness. She regained gross function of her right upper extremity and is walking with minimal limitation. She has not had any cranial nerve issues. She has not had any subsequent stroke or TIA.   Dr. Donzetta Matters last evaluated pt on 04-30-17. At that time pt was out of rehabilitation and at home. She was exhibiting minimal depressive symptoms and  is regaining strength of her right upper and lower extremities. She was to follow up in 6 months with repeat carotid duplex.  She denies claudication type sx's with walking.   Daughter states pt's blood pressure at home is 140-150/90.    Diabetic: no Tobacco use: non-smoker  Pt meds include: Statin : yes ASA: no Other anticoagulants/antiplatelets: Plavix   Past Medical History:  Diagnosis Date  . Hypertension   . Stroke [facility])     Social History Social History   Tobacco Use  . Smoking status: Never Smoker  . Smokeless tobacco: Never Used  Substance Use Topics  . Alcohol use: No  . Drug use: No    Family History Family History  Problem Relation Age of Onset  . Cancer Mother     Surgical History Past Surgical History:  Procedure Laterality Date  . ENDARTERECTOMY Left 04/08/2017   Procedure: ENDARTERECTOMY CAROTID;  Surgeon: Waynetta Sandy, MD;  Location: [facility];  Service: Vascular;  Laterality: Left;  . PATCH ANGIOPLASTY Left 04/08/2017   Procedure: PATCH ANGIOPLASTY Left Carotid;  Surgeon: Waynetta Sandy, MD;  Location: [facility];  Service: Vascular;  Laterality: Left;    No Known Allergies  Current Outpatient Medications  Medication Sig Dispense Refill  . amLODipine (NORVASC) 10 MG tablet  Take 1 tablet (10 mg total) by mouth daily. PATIENT MUST HAVE OFFICE VISIT AND LABS PRIOR TO ANY FURTHER REFILLS 15 tablet 0  . atorvastatin (LIPITOR) 80 MG tablet Take 1 tablet (80 mg total) by mouth daily at 6 PM. 30 tablet 0  . clopidogrel (PLAVIX) 75 MG tablet TAKE 1 TABLET BY MOUTH EVERY DAY 30 tablet 1  . lisinopril (PRINIVIL,ZESTRIL) 20 MG tablet Take 1 tablet (20 mg total) by mouth daily. 30 tablet 4  . pantoprazole (PROTONIX) 40 MG tablet TAKE 1 TABLET BY MOUTH EVERY DAY 30 tablet 1  . traZODone (DESYREL) 50 MG tablet Take 0.5-1 tablets (25-50 mg total) by mouth at bedtime as needed for sleep. 15 tablet 0   No current facility-administered medications for this visit.     Review of Systems : See HPI for pertinent positives and negatives.  Physical Examination  Vitals:   10/31/17 1356 10/31/17 1358  BP: (!) 171/84 (!) 168/81  Pulse: 100   Resp: 19   Temp: 97.7 F (36.5 C)   TempSrc: Oral   SpO2: 100%   Weight: 153 lb 12.8 oz (69.8 kg)    Body mass index is 26.4 kg/m.  General: WDWN female in NAD GAIT: slow, steady Eyes: PERRLA Pulmonary:  Respirations are non-labored, good air movement, CTAB, no rales, rhonchi, or wheezing.  Cardiac: regular rhythm and rate, no detected murmur.  VASCULAR EXAM Carotid Bruits Right Left   Negative Negative     Abdominal aortic pulse is not palpable. Radial pulses  are 2+ palpable and equal.                                                                                                                            LE Pulses Right Left       POPLITEAL  not palpable   not palpable       POSTERIOR TIBIAL  faintly palpable   not palpable        DORSALIS PEDIS      ANTERIOR TIBIAL not palpable  faintly palpable     Gastrointestinal: soft, nontender, BS WNL, no r/g, no palpable masses.  Musculoskeletal: no muscle atrophy/wasting. M/S 5/5 throughout except 4/5 right right UE, extremities without ischemic changes.  Skin: No rashes,  no ulcers, no cellulitis.    Neurologic:  A&O X 3; appropriate affect, sensation is normal; speech is normal, CN 2-12 intact, pain and light touch intact in extremities, motor exam as listed above.    Assessment: [name] [name] is a 71 y.o. female who is s/p left carotid endarterectomy on 04-08-18 by Dr. Donzetta Matters for symptomatic disease.  She had a stroke and was exhibiting right upper extremity paralysis as well as right lower extremity weakness. She regained gross function of her right upper extremity and is walking with minimal limitation She has not had any subsequent stroke or TIA.   Fortunately she does not have DM and has never used tobacco. She takes a daily statin and Plavix.    DATA Carotid Duplex (10/31/17): Right ICA: 1-39% stenosis Left ICA: 40-59% stenosis. Bilateral vertebral artery flow is antegrade.  Bilateral subclavian artery waveforms are normal.  Homogenous material in the proximal ICA is consistent with intimal hyperplasia.  04-03-17 at [facility]: -  Right - 1% to 39 % ICA stenosis. Vertebral artery flow is antegrade. Left - Greater than 80% ICA stenosis . Vertebral artery flow is antegrade.   Plan: Follow-up in 6 months with Carotid Duplex scan.   I discussed in depth with the patient the nature of atherosclerosis, and emphasized the importance of maximal medical management including strict control of blood pressure, blood glucose, and lipid levels, obtaining regular exercise, and continued cessation of smoking.  The patient is aware that without maximal medical management the underlying atherosclerotic disease process will progress, limiting the benefit of any interventions. The patient was given information about stroke prevention and what symptoms should prompt the patient to seek immediate medical care. Thank you for allowing [place] to participate in this patient's care.  Clemon Chambers, RN, MSN, FNP-C [facility] Office:  [phone]  Clinic Physician: Donzetta Matters  10/31/17 2:34 PM

## 2017-10-31 NOTE — Patient Instructions (Signed)

## 2017-11-03 NOTE — Addendum Note (Signed)
Addended by: Lianne Cure A on: 11/03/2017 09:49 AM   Modules accepted: Orders

## 2017-11-17 ENCOUNTER — Ambulatory Visit: Payer: BLUE CROSS/BLUE SHIELD | Admitting: Neurology

## 2017-11-29 ENCOUNTER — Other Ambulatory Visit: Payer: Self-pay | Admitting: Physical Medicine & Rehabilitation

## 2017-11-29 DIAGNOSIS — I10 Essential (primary) hypertension: Secondary | ICD-10-CM

## 2018-01-13 ENCOUNTER — Ambulatory Visit: Payer: BLUE CROSS/BLUE SHIELD | Admitting: Neurology

## 2018-01-13 ENCOUNTER — Encounter: Payer: Self-pay | Admitting: Neurology

## 2018-01-13 ENCOUNTER — Encounter (INDEPENDENT_AMBULATORY_CARE_PROVIDER_SITE_OTHER): Payer: Self-pay

## 2018-01-13 VITALS — BP 169/82 | HR 116 | Wt 153.6 lb

## 2018-01-13 DIAGNOSIS — I10 Essential (primary) hypertension: Secondary | ICD-10-CM

## 2018-01-13 DIAGNOSIS — E785 Hyperlipidemia, unspecified: Secondary | ICD-10-CM | POA: Diagnosis not present

## 2018-01-13 DIAGNOSIS — Z9889 Other specified postprocedural states: Secondary | ICD-10-CM

## 2018-01-13 DIAGNOSIS — I63132 Cerebral infarction due to embolism of left carotid artery: Secondary | ICD-10-CM

## 2018-01-13 NOTE — Progress Notes (Signed)
STROKE NEUROLOGY FOLLOW UP NOTE  NAME: [name] [name] DOB: Jul 11, 1946  REASON FOR VISIT: stroke follow up HISTORY FROM: pt and daughter and chart  Today we had the pleasure of seeing [name] [name] in follow-up at our [facility]rology Clinic. Pt was accompanied by daughter.   History Summary [name] [name] is a 72 y.o. female with history of previous strokes admitted on 04/01/17 for right UE weakness, hypertensive urgency, UTI and AMS. MRI showed left MCA and b/l ACA patchy acute infarcts. MRA and CTA head showed severe right tandem and moderate left ICA siphon stenosis, severe bilateral VA stenosis, and severe right M1 and P2 stenosis as well as azygous ACAs. Carotid Doppler and CTA neck showed left ICA high-grade critical stenosis. EF 65-70%. DVT negative. LDL 130, A1C 5.7. Her stroke is considered due to left ICA stenosis. [facility] Dr. Donzetta Matters consulted and had left CEA on 0/73/71 without complication. She was discharged to CIR with DAPT and lipitor 80.   06/22/2017 follow up - the patient has been doing well. Stayed in CIR for about 2 week and then doing home PT/OT. Recently referred to outpt PT/OT. Followed with Dr. Donzetta Matters and doing well. Repeat CUS in 6 months. Right hand weakness much improved. BP high today 180/91 and daughter said BP at home 140-170s. Dr. Naaman Plummer just increased amlodipine dose to 10mg . Her lisinopril only at 10mg .   Interval History During the interval time, the patient has been doing well.  Blood pressure still not in good control, today BP 169/82.  Patient stated her BP up and down at home, could be due to checking BP at different times of day, or taking BP before and after the BP meds.  I told her to check BP first thing in the morning before breakfast every day so that we can have a better comparison.  Daughter also complains patient easy woke up at night and not get back to sleep, currently she is on trazodone as needed for sleep.  He saw Dr. Donzetta Matters in 10/2017 and repeat carotid Doppler  showed left ICA 40-59% stenosis.  Recommend repeat carotid Doppler in 6 months.  Patient still has right hand mild chronic weakness.  REVIEW OF SYSTEMS: Full 14 system review of systems performed and notable only for those listed below and in HPI above, all others are negative:  Constitutional:   Cardiovascular: Leg swelling Ear/Nose/Throat: Runny nose Skin:  Eyes: Eye itching, eye redness Respiratory:   Gastroitestinal:   Genitourinary:  Hematology/Lymphatic:   Endocrine:  Musculoskeletal:  Neck pain, walking difficulty Allergy/Immunology:  Env allergy Neurological:  Memory loss Psychiatric:  Sleep: restless leg, insomnia  The following represents the patient's updated allergies and side effects list: No Known Allergies  The neurologically relevant items on the patient's problem list were reviewed on today's visit.  Neurologic Examination  A problem focused neurological exam (12 or more points of the single system neurologic examination, vital signs counts as 1 point, cranial nerves count for 8 points) was performed.  Blood pressure (!) 169/82, pulse (!) 116, weight 153 lb 9.6 oz (69.7 kg).  General - Well nourished, well developed, in no apparent distress.  Ophthalmologic - Fundi not visualized due to eye movement.  Cardiovascular - Regular rate and rhythm with no murmur.  Mental Status -  Level of arousal and orientation to time, place, and person were intact. Language including expression, naming, repetition, comprehension was assessed and found intact. Fund of Knowledge was assessed and was intact.  Cranial Nerves II -  XII - II - Visual field intact OU. III, IV, VI - Extraocular movements intact. V - Facial sensation intact bilaterally. VII - Facial movement intact bilaterally. VIII - Hearing & vestibular intact bilaterally. X - Palate elevates symmetrically. XI - Chin turning & shoulder shrug intact bilaterally. XII - Tongue protrusion intact.  Motor Strength  - The patient's strength was normal in all extremities except R bicep tricep 5-/5, right hand grip 3/5 and pronator drift was absent.  Bulk was normal and fasciculations were absent.   Motor Tone - Muscle tone was assessed at the neck and appendages and was normal.  Reflexes - The patient's reflexes were 1+ in all extremities and she had no pathological reflexes.  Sensory - Light touch, temperature/pinprick were assessed and were normal.    Coordination - The patient had normal movements in the hand with no ataxia or dysmetria.  Tremor was absent.  Gait and Station - slow and cautious gait, but steady   Data reviewed: I personally reviewed the images and agree with the radiology interpretations.  Ct Head Wo Contrast 04/01/2017 Chronic white matter ischemic change. Lacunar infarcts are noted. No acute abnormality is seen.   Mr [name] Head/brain Wo Cm 04/01/2017 1. Patchy acute infarcts in the left greater than right cerebral hemispheres with distribution suggesting watershed ischemia.  2. Age advanced chronic Mcfate vessel ischemic disease with numerous chronic infarcts as above.  3. Mild prominence of the pituitary gland for age. Consider laboratory correlation.  4. Advanced intracranial atherosclerosis as above including severe right and moderate left ICA stenoses, severe bilateral vertebral artery stenoses, and severe right P2 PCA stenosis.   Mr Chest W Wo Contrast 04/02/2017 1. No significant abnormality of the brachial plexus is identified.  2. Cervical and thoracic spondylosis.  3. 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   Mr Cervical Spine Wo Contrast 04/01/2017 Mild cervical disc degeneration without high-grade stenosis as above.   CUS Right - 1% to 39% ICA stenosis. Vertebral artery flow is antegrade. Left -Greater than 80% internal carotid artery stenosis. Vertebral artery flow is antegrade.   TTE - Vigorous LV systolic function; mild LVH; mild  diastolic dysfunction; elevated LV filling pressure; sclerotic aortic valve with no AS or AI; trace MR and TR.  LE venous doppler Bilateral: No evidence of DVT, superficial thrombosis, or Baker's Cyst.  Carotid Duplex (10/31/17): Right ICA: 1-39% stenosis Left ICA: 40-59% stenosis. Bilateral vertebral artery flow is antegrade.  Bilateral subclavian artery waveforms are normal.  Homogenous material in the proximal ICA is consistent with intimal hyperplasia.  Component     Latest Ref Rng & Units 04/02/2017  Cholesterol     0 - 200 mg/dL 211 (H)  Triglycerides     <150 mg/dL 59  HDL Cholesterol     >40 mg/dL 69  Total CHOL/HDL Ratio     RATIO 3.1  VLDL     0 - 40 mg/dL 12  LDL (calc)     0 - 99 mg/dL 130 (H)  Hemoglobin A1C     4.8 - 5.6 % 5.7 (H)  Mean Plasma Glucose     mg/dL 117    Assessment: As you may recall, she is a 72 y.o. African American female with PMH of previous strokes admitted on 04/01/17 for left MCA and b/l ACA patchy acute infarcts. MRA and CTA head showed severe right tandem and moderate left ICA siphon stenosis, severe bilateral VA stenosis, and severe right M1 and P2 stenosis as well  as azygous ACAs. Carotid Doppler and CTA neck showed left ICA high-grade critical stenosis. EF 65-70%. DVT negative. LDL 130, A1C 5.7. Her stroke is considered due to left ICA stenosis. [facility] Dr. Donzetta Matters consulted and had left CEA on 9/51/88 without complication. She was discharged to CIR with DAPT and lipitor 80. During the interval time, Right hand weakness much improved. BP still high. Follows with Dr. Donzetta Matters at [facility] and repeat CUS in 10/2017 showed left ICA 40-59% stenosis. BP still not in good control.      Plan:  - continue plavix and lipitor for stroke prevention - check BP at home and record and discuss with PCP for BP meds adjustment - check out if you still need protonix. If not needed, please discontunue - Follow up with your primary care physician for stroke risk factor  modification. Recommend maintain blood pressure goal 120-140/80, diabetes with hemoglobin A1c goal below 7.0% and lipids with LDL cholesterol goal below 70 mg/dL.  - continue to follow up with Dr. Donzetta Matters  - continue self exercise about right hand - healthy diet and regular exercise  - follow up as needed.  I spent more than 25 minutes of face to face time with the patient. Greater than 50% of time was spent in counseling and coordination of care. We discussed BP management, drug-drug interactions, and follow up with Dr. Donzetta Matters   No orders of the defined types were placed in this encounter.   No orders of the defined types were placed in this encounter.   Patient Instructions  - continue plavix and lipitor for stroke prevention - check BP at home and record and discuss with PCP for BP meds adjustment - check out if you still need protonix. If not needed, please discontunue - Follow up with your primary care physician for stroke risk factor modification. Recommend maintain blood pressure goal 120-140/80, diabetes with hemoglobin A1c goal below 7.0% and lipids with LDL cholesterol goal below 70 mg/dL.  - continue to follow up with Dr. Donzetta Matters  - continue self exercise about right hand - healthy diet and regular exercise  - follow up as needed.   Rosalin Hawking, MD PhD [facility] [address] [place], [place] [address] [phone]

## 2018-01-13 NOTE — Patient Instructions (Signed)
-   continue plavix and lipitor for stroke prevention - check BP at home and record and discuss with PCP for BP meds adjustment - check out if you still need protonix. If not needed, please discontunue - Follow up with your primary care physician for stroke risk factor modification. Recommend maintain blood pressure goal 120-140/80, diabetes with hemoglobin A1c goal below 7.0% and lipids with LDL cholesterol goal below 70 mg/dL.  - continue to follow up with Dr. Donzetta Matters  - continue self exercise about right hand - healthy diet and regular exercise  - follow up as needed.

## 2018-03-05 ENCOUNTER — Other Ambulatory Visit: Payer: Self-pay | Admitting: Physical Medicine & Rehabilitation

## 2018-03-05 DIAGNOSIS — I10 Essential (primary) hypertension: Secondary | ICD-10-CM

## 2018-05-01 ENCOUNTER — Encounter (HOSPITAL_COMMUNITY): Payer: BLUE CROSS/BLUE SHIELD

## 2018-05-01 ENCOUNTER — Ambulatory Visit: Payer: BLUE CROSS/BLUE SHIELD | Admitting: Family

## 2018-05-28 ENCOUNTER — Encounter (HOSPITAL_COMMUNITY): Payer: BLUE CROSS/BLUE SHIELD

## 2018-05-28 ENCOUNTER — Ambulatory Visit: Payer: Medicare Other | Admitting: Family

## 2018-06-01 ENCOUNTER — Other Ambulatory Visit: Payer: Self-pay | Admitting: Physical Medicine & Rehabilitation

## 2018-06-01 DIAGNOSIS — I10 Essential (primary) hypertension: Secondary | ICD-10-CM

## 2018-08-10 ENCOUNTER — Encounter: Payer: Self-pay | Admitting: Family

## 2018-08-10 ENCOUNTER — Other Ambulatory Visit: Payer: Self-pay

## 2018-08-10 ENCOUNTER — Ambulatory Visit (INDEPENDENT_AMBULATORY_CARE_PROVIDER_SITE_OTHER): Payer: Medicare Other | Admitting: Family

## 2018-08-10 ENCOUNTER — Ambulatory Visit (HOSPITAL_COMMUNITY)
Admission: RE | Admit: 2018-08-10 | Discharge: 2018-08-10 | Disposition: A | Discharge status: Home / Self Care | Payer: Medicare Other | Source: Ambulatory Visit | Attending: Family | Admitting: Family

## 2018-08-10 VITALS — BP 157/73 | HR 102 | Temp 98.1°F | Resp 14 | Ht 66.5 in | Wt 167.0 lb

## 2018-08-10 DIAGNOSIS — I6522 Occlusion and stenosis of left carotid artery: Secondary | ICD-10-CM

## 2018-08-10 DIAGNOSIS — Z9889 Other specified postprocedural states: Secondary | ICD-10-CM | POA: Diagnosis not present

## 2018-08-10 DIAGNOSIS — I63232 Cerebral infarction due to unspecified occlusion or stenosis of left carotid arteries: Secondary | ICD-10-CM

## 2018-08-10 DIAGNOSIS — I6523 Occlusion and stenosis of bilateral carotid arteries: Secondary | ICD-10-CM | POA: Insufficient documentation

## 2018-08-10 DIAGNOSIS — N6310 Unspecified lump in the right breast, unspecified quadrant: Secondary | ICD-10-CM

## 2018-08-10 NOTE — Patient Instructions (Signed)

## 2018-08-10 NOTE — Progress Notes (Signed)
Chief Complaint: Follow up Extracranial Carotid Artery Stenosis   History of Present Illness  [name] [name] is a 72 y.o. female who is s/p left carotid endarterectomy on 04-08-18 by Dr. Donzetta Matters for symptomatic disease.  She had a stroke and was exhibiting right upper extremity paralysis as well as right lower extremity weakness. She regained gross function of her right upper extremity and is walking with minimal limitation. She has not had any cranial nerve issues. She has not had any subsequent stroke or TIA.   Dr. Donzetta Matters last evaluated pt on 04-30-17. At that time pt was out of rehabilitation and at home. She was exhibiting minimal depressive symptoms and  is regaining strength of her right upper and lower extremities. She was to follow up in 6 months with repeat carotid duplex.  She no longer feels depressed.   She denies claudication type sx's with walking.   Daughter states pt has white coat syndrome re her blood pressure.    Diabetic: no Tobacco use: non-smoker  Pt meds include: Statin : yes ASA: no Other anticoagulants/antiplatelets: Plavix   Past Medical History:  Diagnosis Date  . Carotid stenosis   . Hypertension   . Stroke [facility])     Social History Social History   Tobacco Use  . Smoking status: Never Smoker  . Smokeless tobacco: Never Used  Substance Use Topics  . Alcohol use: No  . Drug use: No    Family History Family History  Problem Relation Age of Onset  . Cancer Mother     Surgical History Past Surgical History:  Procedure Laterality Date  . ENDARTERECTOMY Left 04/08/2017   Procedure: ENDARTERECTOMY CAROTID;  Surgeon: Waynetta Sandy, MD;  Location: [facility];  Service: Vascular;  Laterality: Left;  . PATCH ANGIOPLASTY Left 04/08/2017   Procedure: PATCH ANGIOPLASTY Left Carotid;  Surgeon: Waynetta Sandy, MD;  Location: [facility];  Service: Vascular;  Laterality: Left;    No Known Allergies  Current Outpatient Medications   Medication Sig Dispense Refill  . amLODipine (NORVASC) 10 MG tablet Take 1 tablet (10 mg total) by mouth daily. PATIENT MUST HAVE OFFICE VISIT AND LABS PRIOR TO ANY FURTHER REFILLS 15 tablet 0  . atorvastatin (LIPITOR) 80 MG tablet Take 1 tablet (80 mg total) by mouth daily at 6 PM. 30 tablet 0  . clopidogrel (PLAVIX) 75 MG tablet TAKE 1 TABLET BY MOUTH EVERY DAY 30 tablet 1  . pantoprazole (PROTONIX) 40 MG tablet TAKE 1 TABLET BY MOUTH EVERY DAY 30 tablet 1  . traZODone (DESYREL) 50 MG tablet Take 0.5-1 tablets (25-50 mg total) by mouth at bedtime as needed for sleep. 15 tablet 0  . lisinopril (PRINIVIL,ZESTRIL) 20 MG tablet TAKE 1 TABLET BY MOUTH EVERY DAY 30 tablet 2  . lisinopril-hydrochlorothiazide (PRINZIDE,ZESTORETIC) 20-12.5 MG tablet Take 1 tablet by mouth daily.  11   No current facility-administered medications for this visit.     Review of Systems : See HPI for pertinent positives and negatives.  Physical Examination  Vitals:   08/10/18 1309 08/10/18 1313 08/10/18 1315  BP: (!) 157/74 (!) 147/74 (!) 157/73  Pulse: (!) 101 (!) 102 (!) 102  Resp: 14    Temp: 98.1 F (36.7 C)    TempSrc: Oral    SpO2: 100%    Weight: 167 lb (75.8 kg)    Height: 5' 6.5" (1.689 m)     Body mass index is 26.55 kg/m.  General: WDWN female in NAD, accompanied by her daughter  GAIT: slow, steady Eyes: PERRLA HENT: no gross abnormality.  Pulmonary:  Respirations are non-labored, good air movement, CTAB, no rales, rhonchi, or wheezing. Cardiac: regular rhythm and rate, no detected murmur.  VASCULAR EXAM Carotid Bruits Right Left   Negative Negative     Abdominal aortic pulse is not palpable. Radial pulses are 2+ palpable and equal.                                                                                                                                          LE Pulses Right Left       POPLITEAL  not palpable   not palpable       POSTERIOR TIBIAL  faintly palpable   not  palpable        DORSALIS PEDIS      ANTERIOR TIBIAL not palpable  faintly palpable     Gastrointestinal: soft, nontender, BS WNL, no r/g, no palpable masses. Musculoskeletal: no muscle atrophy/wasting. M/S 5/5 throughout except 4/5 right right UE, extremities without ischemic changes. Skin: No ulcers, no cellulitis.  Daughter asked me to examine pt's right breast due to rash present. Entire right breast is hard, nipple is inverted, and several red macular, non ulcerated lesions are present on the right breast. Right axilla: unable to palpate lymph nodes, but the right axilla is more firm than the left axilla.  Neurologic:  A&O X 3; appropriate affect, sensation is normal; speech is normal, CN 2-12 intact, pain and light touch intact in extremities, motor exam as listed above. Psychiatric: Normal thought content, mood appropriate to clinical situation.    Assessment: [name] [name] is a 72 y.o. female who who is s/p left carotid endarterectomy on 04-08-18 by Dr. Donzetta Matters for symptomatic disease.  She had a stroke and was exhibiting right upper extremity paralysis as well as right lower extremity weakness. She regained gross function of her right upper extremity and is walking with minimal limitation She has not had any subsequent stroke or TIA.   Fortunately she does not have DM and has never used tobacco. She takes a daily statin and Plavix.   Large mass of right breast:  Daughter asked me to examine pt's right breast due to rash present, pt agreed. Entire right breast is hard, nipple is inverted, and several red macular, non ulcerated lesions are present on the right breast. Right axilla: unable to palpate lymph nodes, but the right axilla is more firm than the left axilla. Pt has never had a mammogram according to daughter, and is scheduled to have one this afternoon.    DATA Carotid Duplex (08-10-18): Right ICA: 1-39% stenosis Left ICA: CEA site, with 1-39% stenosis Bilateral  vertebral artery flow is antegrade.  Bilateral subclavian artery waveforms are normal.  Less stenosis noted in the left ICA (CEA site) compared to the exam on 10-31-17.  04-03-17 at [facility]: -  Right -  1% to 39 % ICA stenosis. Vertebral artery flow is antegrade. Left - Greater than 80% ICA stenosis . Vertebral artery flow is antegrade.   Plan:  Follow-up in 1 year with Carotid Duplex scan.   I discussed in depth with the patient the nature of atherosclerosis, and emphasized the importance of maximal medical management including strict control of blood pressure, blood glucose, and lipid levels, obtaining regular exercise, and continued cessation of smoking.  The patient is aware that without maximal medical management the underlying atherosclerotic disease process will progress, limiting the benefit of any interventions. The patient was given information about stroke prevention and what symptoms should prompt the patient to seek immediate medical care. Thank you for allowing [place] to participate in this patient's care.  Clemon Chambers, RN, MSN, FNP-C [facility] Office: [facility] Physician: Trula Slade  08/10/18 1:18 PM

## 2018-08-11 DIAGNOSIS — N6312 Unspecified lump in the right breast, upper inner quadrant: Secondary | ICD-10-CM | POA: Diagnosis not present

## 2018-08-19 ENCOUNTER — Other Ambulatory Visit: Payer: Self-pay | Admitting: Radiology

## 2018-08-19 DIAGNOSIS — C50811 Malignant neoplasm of overlapping sites of right female breast: Secondary | ICD-10-CM | POA: Diagnosis not present

## 2018-08-19 DIAGNOSIS — C50211 Malignant neoplasm of upper-inner quadrant of right female breast: Secondary | ICD-10-CM | POA: Diagnosis not present

## 2018-08-24 ENCOUNTER — Telehealth: Payer: Self-pay | Admitting: *Deleted

## 2018-08-24 DIAGNOSIS — C50811 Malignant neoplasm of overlapping sites of right female breast: Secondary | ICD-10-CM | POA: Insufficient documentation

## 2018-08-24 DIAGNOSIS — Z170 Estrogen receptor positive status [ER+]: Principal | ICD-10-CM | POA: Insufficient documentation

## 2018-08-24 NOTE — Telephone Encounter (Signed)
Left vm for pt to return call to discuss [facility] for 08/26/18. Contact information provided.

## 2018-08-25 ENCOUNTER — Telehealth: Payer: Self-pay | Admitting: *Deleted

## 2018-08-25 NOTE — Progress Notes (Signed)
[name] [name]  Telephone:([phone] Fax:(336) [phone]     ID: [name] [name] DOB: 1945/12/31  MR#: [id number]  QKM#:[id number]  Patient Care Team: [name] Carol, MD as PCP - General (Internal Medicine) [name] Sandy, MD as Consulting Physician (Vascular Surgery) [name] Berthold, NP as Nurse Practitioner (Cardiology) [name] Staggers, MD as Consulting Physician (Physical Medicine and Rehabilitation) [name], [name] Leyden, NP as Nurse Practitioner (Vascular Surgery) [name] Roys, PsyD as Consulting Physician (Psychology) , [name] Dad, MD as Consulting Physician (Oncology) [name] Kussmaul, MD as Consulting Physician (General Surgery) [name] Gibson, MD as Attending Physician (Radiation Oncology) OTHER MD:  CHIEF COMPLAINT: Inflammatory breast cancer  CURRENT TREATMENT: Neoadjuvant chemotherapy   HISTORY OF CURRENT ILLNESS: [name] [name] presented with right breast discoloration and redness. The right breast also had an area of hardness and nipple retraction.  She did not bring this to medical attention but her daughter did mention it when the patient had neurologic follow-up on 08/10/2018.  At that time the nurse practitioner, [name], noted right nipple inversion, hard breast, and red non-ulcerated lesions on the breast.  The patient then underwent bilateral diagnostic mammography with tomography and right breast ultrasonography at [name] on 08/11/2018 (this is her first ever mammogram) showing: breast density category B. There was a round 2.5 cm mass at the 1 o'clock upper inner quadrant of the right breast.  By palpation this measured approximately 10 cm.  Sonographically the irregular hypoechoic mass measured 2.7 cm. There was also a hypoechoic mass in the right breast at 9 o'clock upper outer quadrant measuring 3.8 cm. There was an abnormal right axillary lymph node with cortical thickening.   Accordingly on 08/19/2018 she proceeded to biopsy of  the right breast areas in question. The pathology from this procedure showed [phone]): At the 1 o'clock: invasive lobular carcinoma, grade II. Prognostic indicators significant for: estrogen receptor, 60% positive with moderate staining intensity and progesterone receptor, 0% negative. Proliferation marker Ki67 at 20%. HER2 negative with an immunohistochemistry of (1+).  At the 9 o'clock: invasive lobular carcinoma, grade II. Prognostic indicators significant for: estrogen receptor, 90% positive with strong staining intensity and progesterone receptor, 0% negative. Proliferation marker Ki67 at 30%. HER2 negative with an immunohistochemistry of (1+).  The patient's subsequent history is as detailed below.  INTERVAL HISTORY: [name] [name] was evaluated in the multidisciplinary breast cancer clinic on 08/26/2018 accompanied by her daughter, [name] [name]. [name] [name]'s case was also presented at the multidisciplinary breast cancer conference on the same day. At that time a preliminary plan was proposed: Neoadjuvant chemotherapy, definitive surgery, adjuvant radiation, antiestrogens, genetics testing   REVIEW OF SYSTEMS: [name] [name] reports that during her stroke in 2018, she had trouble moving her right hand, and then she became disoriented. The symptoms lasted about 2 days.  She waited to seek medical care.  When eventually evaluated, she was found to have significant left carotid stenosis and underwent carotid endarterectomy under Dr. Donzetta [name] May 2018.  She was placed on blood thinners, and she is tolerating this well. She is having blurry vision, and it is worse when she sees far. She will follow up with her eye doctor in November. Also, she is still having weakness in her right arm and right leg. She denies having any falls. The patient denies unusual headaches, nausea, vomiting, stiff neck, dizziness, or gait imbalance. There has been no cough, phlegm production, or pleurisy, no chest pain or pressure, and no change in  bowel or bladder habits. The patient denies fever,  rash, bleeding, unexplained fatigue or unexplained weight loss. A detailed review of systems was otherwise entirely negative.   PAST MEDICAL HISTORY: Past Medical History:  Diagnosis Date  . Carotid stenosis   . Hypertension   . Stroke [name])     PAST SURGICAL HISTORY: Past Surgical History:  Procedure Laterality Date  . ENDARTERECTOMY Left 04/08/2017   Procedure: ENDARTERECTOMY CAROTID;  Surgeon: [name] Sandy, MD;  Location: [name];  Service: Vascular;  Laterality: Left;  . PATCH ANGIOPLASTY Left 04/08/2017   Procedure: PATCH ANGIOPLASTY Left Carotid;  Surgeon: [name] Sandy, MD;  Location: [name];  Service: Vascular;  Laterality: Left;  total hysterectomy with bilateral salpingo-oophorectomy in her 13's no hormone replacement  FAMILY HISTORY Family History  Problem Relation Age of Onset  . Cancer Mother        ovarian  The patient's father is alive at age 76. The patient's mother died at age 2 due to ovarian cancer. The patient had 3 brothers and 3 sisters. She denies a history of breast, pancreatic, colon, or other cancers in the family.   GYNECOLOGIC HISTORY:  No LMP recorded. Menarche: Age at first live birth: 72 years old She is GX P1.  She is status post total hysterectomy with BSO in her early 83's. She did not use HRT.    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. She is divorced. At home is just herself, but her two grandsons regularly come to visit and stay with her. The patient's daughter, [name] [name] works in administration for [name]. The patient's 46 y.o. grandson is a Freight forwarder for [name]. The patient's 40 y.o. grandson works in [employer].     ADVANCED DIRECTIVES: Not in place.  At the 08/26/2018 visit the patient was given the appropriate documents to complete and notarized at her discretion.   HEALTH MAINTENANCE: Social History   Tobacco Use  .  Smoking status: Never Smoker  . Smokeless tobacco: Never Used  Substance Use Topics  . Alcohol use: No  . Drug use: No     Colonoscopy: Never  PAP: Status post hysterectomy  Bone density: Never   No Known Allergies  Current Outpatient Medications  Medication Sig Dispense Refill  . amLODipine (NORVASC) 10 MG tablet Take 1 tablet (10 mg total) by mouth daily. PATIENT MUST HAVE OFFICE VISIT AND LABS PRIOR TO ANY FURTHER REFILLS 15 tablet 0  . atorvastatin (LIPITOR) 80 MG tablet Take 1 tablet (80 mg total) by mouth daily at 6 PM. 30 tablet 0  . clopidogrel (PLAVIX) 75 MG tablet TAKE 1 TABLET BY MOUTH EVERY DAY 30 tablet 1  . lisinopril (PRINIVIL,ZESTRIL) 20 MG tablet TAKE 1 TABLET BY MOUTH EVERY DAY 30 tablet 2  . lisinopril-hydrochlorothiazide (PRINZIDE,ZESTORETIC) 20-12.5 MG tablet Take 1 tablet by mouth daily.  11  . pantoprazole (PROTONIX) 40 MG tablet TAKE 1 TABLET BY MOUTH EVERY DAY 30 tablet 1  . traZODone (DESYREL) 50 MG tablet Take 0.5-1 tablets (25-50 mg total) by mouth at bedtime as needed for sleep. 15 tablet 0   No current facility-administered medications for this visit.     OBJECTIVE: Older African-American [name] who appears stated age  83:   08/26/18 1255  BP: (!) 155/58  Pulse: (!) 101  Resp: 18  Temp: 98.7 F (37.1 C)  SpO2: 100%     Body mass index is 27.14 kg/m.   Wt Readings from Last 3 Encounters:  08/26/18 170 lb 11.2 oz (  77.4 kg)  08/10/18 167 lb (75.8 kg)  01/13/18 153 lb 9.6 oz (69.7 kg)      ECOG FS:2 - Symptomatic, <50% confined to bed  Ocular: Sclerae unicteric, pupils round and equal, EOMs intact Lymphatic: No cervical or supraclavicular adenopathy Lungs no rales or rhonchi Heart regular rate and rhythm Abd soft, nontender, positive bowel sounds MSK no focal spinal tenderness, no joint edema Neuro:4/5 RUE, appears well-oriented, appropriate affect Breasts: The right breast is imaged below.  It is hard, although the main mass appears  still movable.  There is significant erythema and some nipple retraction.  I do not palpate any axillary adenopathy.  The left breast is benign.  The left axilla is benign.  Right Breast 08/26/2018        LAB RESULTS:  CMP     Component Value Date/Time   NA 145 08/26/2018 1235   K 3.6 08/26/2018 1235   CL 106 08/26/2018 1235   CO2 30 08/26/2018 1235   GLUCOSE 136 (H) 08/26/2018 1235   BUN 16 08/26/2018 1235   CREATININE 1.08 (H) 08/26/2018 1235   CALCIUM 9.5 08/26/2018 1235   PROT 7.6 08/26/2018 1235   ALBUMIN 3.8 08/26/2018 1235   AST 15 08/26/2018 1235   ALT 20 08/26/2018 1235   ALKPHOS 91 08/26/2018 1235   BILITOT 0.4 08/26/2018 1235   GFRNONAA 51 (L) 08/26/2018 1235   GFRAA 59 (L) 08/26/2018 1235    No results found for: TOTALPROTELP, ALBUMINELP, A1GS, A2GS, BETS, BETA2SER, GAMS, MSPIKE, SPEI  No results found for: KPAFRELGTCHN, LAMBDASER, KAPLAMBRATIO  Lab Results  Component Value Date   WBC 5.3 08/26/2018   NEUTROABS 2.8 08/26/2018   HGB 11.5 (L) 08/26/2018   HCT 37.1 08/26/2018   MCV 81.4 08/26/2018   PLT 199 08/26/2018    _0 @  No results found for: LABCA2  No components found for: ZOXWRU045  No results for input(s): INR in the last 168 hours.  No results found for: LABCA2  No results found for: WUJ811  No results found for: BJY782  No results found for: NFA213  No results found for: CA2729  No components found for: HGQUANT  No results found for: CEA1 / No results found for: CEA1   No results found for: AFPTUMOR  No results found for: CHROMOGRNA  No results found for: PSA1  Appointment on 08/26/2018  Component Date Value Ref Range Status  . WBC Count 08/26/2018 5.3  4.0 - 10.5 K/uL Final  . RBC 08/26/2018 4.56  3.87 - 5.11 MIL/uL Final  . Hemoglobin 08/26/2018 11.5* 12.0 - 15.0 g/dL Final  . HCT 08/26/2018 37.1  36.0 - 46.0 % Final  . MCV 08/26/2018 81.4  80.0 - 100.0 fL Final  . MCH 08/26/2018 25.2* 26.0 - 34.0 pg  Final  . MCHC 08/26/2018 31.0  30.0 - 36.0 g/dL Final  . RDW 08/26/2018 14.0  11.5 - 15.5 % Final  . Platelet Count 08/26/2018 199  150 - 400 K/uL Final  . nRBC 08/26/2018 0.0  0.0 - 0.2 % Final  . Neutrophils Relative % 08/26/2018 53  % Final  . Neutro Abs 08/26/2018 2.8  1.7 - 7.7 K/uL Final  . Lymphocytes Relative 08/26/2018 34  % Final  . Lymphs Abs 08/26/2018 1.8  0.7 - 4.0 K/uL Final  . Monocytes Relative 08/26/2018 9  % Final  . Monocytes Absolute 08/26/2018 0.5  0.1 - 1.0 K/uL Final  . Eosinophils Relative 08/26/2018 3  % Final  . Eosinophils Absolute  08/26/2018 0.2  0.0 - 0.5 K/uL Final  . Basophils Relative 08/26/2018 1  % Final  . Basophils Absolute 08/26/2018 0.1  0.0 - 0.1 K/uL Final  . Immature Granulocytes 08/26/2018 0  % Final  . Abs Immature Granulocytes 08/26/2018 0.02  0.00 - 0.07 K/uL Final   Performed at [name], [place] [address] [place], [place] [address]  . Sodium 08/26/2018 145  135 - 145 mmol/L Final  . Potassium 08/26/2018 3.6  3.5 - 5.1 mmol/L Final  . Chloride 08/26/2018 106  98 - 111 mmol/L Final  . CO2 08/26/2018 30  22 - 32 mmol/L Final  . Glucose, Bld 08/26/2018 136* 70 - 99 mg/dL Final  . BUN 08/26/2018 16  8 - 23 mg/dL Final  . Creatinine 08/26/2018 1.08* 0.44 - 1.00 mg/dL Final  . Calcium 08/26/2018 9.5  8.9 - 10.3 mg/dL Final  . Total Protein 08/26/2018 7.6  6.5 - 8.1 g/dL Final  . Albumin 08/26/2018 3.8  3.5 - 5.0 g/dL Final  . AST 08/26/2018 15  15 - 41 U/L Final  . ALT 08/26/2018 20  0 - 44 U/L Final  . Alkaline Phosphatase 08/26/2018 91  38 - 126 U/L Final  . Total Bilirubin 08/26/2018 0.4  0.3 - 1.2 mg/dL Final  . GFR, Est Non Af Am 08/26/2018 51* >60 mL/min Final  . GFR, Est AFR Am 08/26/2018 59* >60 mL/min Final   Comment: (NOTE) The eGFR has been calculated using the CKD EPI equation. This calculation has not been validated in all clinical situations. eGFR's persistently <60 mL/min signify possible Chronic  Kidney Disease.   Georgiann Hahn gap 08/26/2018 9  5 - 15 Final   Performed at [facility], [place] [address] [place], [place] [address]    (this displays the last labs from the last 3 days)  No results found for: TOTALPROTELP, ALBUMINELP, A1GS, A2GS, BETS, BETA2SER, GAMS, MSPIKE, SPEI (this displays SPEP labs)  No results found for: KPAFRELGTCHN, LAMBDASER, KAPLAMBRATIO (kappa/lambda light chains)  No results found for: HGBA, HGBA2QUANT, HGBFQUANT, HGBSQUAN (Hemoglobinopathy evaluation)   No results found for: LDH  No results found for: IRON, TIBC, IRONPCTSAT (Iron and TIBC)  No results found for: FERRITIN  Urinalysis    Component Value Date/Time   COLORURINE AMBER (A) 04/01/2017 0920   APPEARANCEUR CLOUDY (A) 04/01/2017 0920   LABSPEC 1.021 04/01/2017 0920   PHURINE 5.0 04/01/2017 0920   GLUCOSEU NEGATIVE 04/01/2017 0920   HGBUR MODERATE (A) 04/01/2017 0920   BILIRUBINUR NEGATIVE 04/01/2017 0920   KETONESUR 80 (A) 04/01/2017 0920   PROTEINUR NEGATIVE 04/01/2017 0920   NITRITE NEGATIVE 04/01/2017 0920   LEUKOCYTESUR LARGE (A) 04/01/2017 0920     STUDIES: Outside studies pending  ELIGIBLE FOR AVAILABLE RESEARCH PROTOCOL: no  ASSESSMENT: 72 y.o. [name] [name], [name] status post right breast biopsy of overlapping sites on 08/19/2018 for a clinical T4 N1, stage IIIB invasive lobular carcinoma, estrogen receptor positive, progesterone receptor negative, HER-2 not amplified, with an MIB-1 of 20%.  (1) genetics testing pending  (2) neoadjuvant chemotherapy to consist of cyclophosphamide, methotrexate and fluorouracil every 21 days x 8, to begin 09/08/2018  (3) definitive surgery to follow  (4) adjuvant radiation  (5) antiestrogens therapy at the completion of local treatment  (6) foundation 1/PD-L1 testing  PLAN: I spent approximately 60 minutes face to face with [name] [name] and her daughter with more than 50% of that time spent in counseling and  coordination of care. Specifically  we reviewed the biology of the patient's diagnosis and the specifics of her situation. We first reviewed the fact that cancer is not one disease but more than 100 different diseases and that it is important to keep them separate-- otherwise when friends and relatives discuss their own cancer experiences with [name] [name] confusion can result. Similarly we explained that if breast cancer spreads to the bone or liver, the patient would not have bone cancer or liver cancer, but breast cancer in the bone and breast cancer in the liver: one cancer in three places-- not 3 different cancers which otherwise would have to be treated in 3 different ways.  We discussed the difference between local and systemic therapy. In terms of loco-[name] treatment, lumpectomy plus radiation is equivalent to mastectomy as far as survival is concerned.  However, in case of inflammatory breast cancer lumpectomy is not possible and mastectomy followed by postmastectomy radiation is indicated.    We also noted that in terms of sequencing of treatments, whether systemic therapy or surgery is done first does not affect the ultimate outcome.  In this case we recommend neoadjuvant chemotherapy to make the surgery more feasible as well as to treat the systemic disease  We then discussed the rationale for systemic therapy. There is significant risk that this cancer may have already spread to other parts of her body.  Accordingly the patient will be staged with CT scans and a bone scan.  They understand that if we are dealing with stage IV disease this would not be curable and we would have a different discussion at that time.  Next we went over the options for systemic therapy which are anti-estrogens, anti-HER-2 immunotherapy, and chemotherapy. [name] [name] does not meet criteria for anti-HER-2 immunotherapy. She is a good candidate for anti-estrogens.  She will also benefit from chemotherapy.  In the  neoadjuvant setting, lobular breast cancers do not respond as briskly to neoadjuvant treatment as ductal is due, but they do respond.  We discussed the fact that standard of care in this setting is cyclophosphamide doxorubicin and paclitaxel, but I do not believe [name] [name] would be able to tolerate this.  Accordingly we are going to go with second best cyclophosphamide, methotrexate and fluorouracil.  We discussed the possible side effects toxicities and complications of this combination and the plan will be for her to receive a total of 8 cycles given 3 weeks apart, by port.  We are hoping to start 09/08/2017.  Once the patient completes chemotherapy she will proceed to definitive surgery and then adjuvant radiation.  She will then be on antiestrogens a minimum of 7 years.  I am also going to request foundation one and PDL 1 testing on this tumor.  The patient may qualify for pembrolizumab or PIK3 inhibitors in addition to the more standard treatments above, depending on results  Finally [name] does qualify for genetics testing.  This is being operationalised.  If she proves to be BRCA positive there would be additional treatments we could consider such as PARP inhibitors.  [name] [name] has a good understanding of the overall plan. She agrees with it. She knows the goal of treatment in her case is cure. She will call with any problems that may develop before her next visit here.   , [name] Dad, MD  08/26/18 1:34 PM Medical Oncology and Hematology [facility] [address] [place], [place] [address] Tel. [phone]    Fax. [phone]  Alice Rieger, am acting as scribe  for Chauncey Cruel MD.  I, Lurline Del MD, have reviewed the above documentation for accuracy and completeness, and I agree with the above.

## 2018-08-25 NOTE — Telephone Encounter (Signed)
Confirmed appt with daughter [name]. Gave instructions and information on [facility] for 10.16.19

## 2018-08-26 ENCOUNTER — Telehealth: Payer: Self-pay | Admitting: Oncology

## 2018-08-26 ENCOUNTER — Encounter: Payer: Self-pay | Admitting: Physical Therapy

## 2018-08-26 ENCOUNTER — Inpatient Hospital Stay: Payer: Medicare Other

## 2018-08-26 ENCOUNTER — Ambulatory Visit: Payer: Self-pay | Admitting: General Surgery

## 2018-08-26 ENCOUNTER — Encounter: Payer: Self-pay | Admitting: Oncology

## 2018-08-26 ENCOUNTER — Ambulatory Visit
Admission: RE | Admit: 2018-08-26 | Discharge: 2018-08-26 | Disposition: A | Discharge status: Home / Self Care | Payer: Medicare Other | Source: Ambulatory Visit | Attending: Radiation Oncology | Admitting: Radiation Oncology

## 2018-08-26 ENCOUNTER — Ambulatory Visit: Payer: Medicare Other | Attending: General Surgery | Admitting: Physical Therapy

## 2018-08-26 ENCOUNTER — Other Ambulatory Visit: Payer: Self-pay

## 2018-08-26 ENCOUNTER — Inpatient Hospital Stay: Payer: Medicare Other | Attending: Oncology | Admitting: Oncology

## 2018-08-26 ENCOUNTER — Other Ambulatory Visit: Payer: Self-pay | Admitting: *Deleted

## 2018-08-26 VITALS — BP 155/58 | HR 101 | Temp 98.7°F | Resp 18 | Ht 66.5 in | Wt 170.7 lb

## 2018-08-26 DIAGNOSIS — Z170 Estrogen receptor positive status [ER+]: Secondary | ICD-10-CM | POA: Diagnosis not present

## 2018-08-26 DIAGNOSIS — C50811 Malignant neoplasm of overlapping sites of right female breast: Secondary | ICD-10-CM | POA: Insufficient documentation

## 2018-08-26 DIAGNOSIS — M6281 Muscle weakness (generalized): Secondary | ICD-10-CM | POA: Insufficient documentation

## 2018-08-26 DIAGNOSIS — I63132 Cerebral infarction due to embolism of left carotid artery: Secondary | ICD-10-CM

## 2018-08-26 DIAGNOSIS — Z8673 Personal history of transient ischemic attack (TIA), and cerebral infarction without residual deficits: Secondary | ICD-10-CM | POA: Diagnosis not present

## 2018-08-26 DIAGNOSIS — Z79899 Other long term (current) drug therapy: Secondary | ICD-10-CM | POA: Insufficient documentation

## 2018-08-26 DIAGNOSIS — C50911 Malignant neoplasm of unspecified site of right female breast: Secondary | ICD-10-CM | POA: Insufficient documentation

## 2018-08-26 DIAGNOSIS — Z8041 Family history of malignant neoplasm of ovary: Secondary | ICD-10-CM | POA: Diagnosis not present

## 2018-08-26 DIAGNOSIS — R293 Abnormal posture: Secondary | ICD-10-CM | POA: Insufficient documentation

## 2018-08-26 DIAGNOSIS — I69359 Hemiplegia and hemiparesis following cerebral infarction affecting unspecified side: Secondary | ICD-10-CM

## 2018-08-26 LAB — CBC WITH DIFFERENTIAL (CANCER CENTER ONLY)
Abs Immature Granulocytes: 0.02 10*3/uL (ref 0.00–0.07)
BASOS ABS: 0.1 10*3/uL (ref 0.0–0.1)
Basophils Relative: 1 %
EOS PCT: 3 %
Eosinophils Absolute: 0.2 10*3/uL (ref 0.0–0.5)
HEMATOCRIT: 37.1 % (ref 36.0–46.0)
HEMOGLOBIN: 11.5 g/dL — AB (ref 12.0–15.0)
Immature Granulocytes: 0 %
LYMPHS ABS: 1.8 10*3/uL (ref 0.7–4.0)
LYMPHS PCT: 34 %
MCH: 25.2 pg — ABNORMAL LOW (ref 26.0–34.0)
MCHC: 31 g/dL (ref 30.0–36.0)
MCV: 81.4 fL (ref 80.0–100.0)
MONO ABS: 0.5 10*3/uL (ref 0.1–1.0)
Monocytes Relative: 9 %
NRBC: 0 % (ref 0.0–0.2)
Neutro Abs: 2.8 10*3/uL (ref 1.7–7.7)
Neutrophils Relative %: 53 %
Platelet Count: 199 10*3/uL (ref 150–400)
RBC: 4.56 MIL/uL (ref 3.87–5.11)
RDW: 14 % (ref 11.5–15.5)
WBC Count: 5.3 10*3/uL (ref 4.0–10.5)

## 2018-08-26 LAB — CMP (CANCER CENTER ONLY)
ALK PHOS: 91 U/L (ref 38–126)
ALT: 20 U/L (ref 0–44)
ANION GAP: 9 (ref 5–15)
AST: 15 U/L (ref 15–41)
Albumin: 3.8 g/dL (ref 3.5–5.0)
BILIRUBIN TOTAL: 0.4 mg/dL (ref 0.3–1.2)
BUN: 16 mg/dL (ref 8–23)
CALCIUM: 9.5 mg/dL (ref 8.9–10.3)
CO2: 30 mmol/L (ref 22–32)
CREATININE: 1.08 mg/dL — AB (ref 0.44–1.00)
Chloride: 106 mmol/L (ref 98–111)
GFR, Est AFR Am: 59 mL/min — ABNORMAL LOW (ref >60)
GFR, Estimated: 51 mL/min — ABNORMAL LOW (ref >60)
GLUCOSE: 136 mg/dL — AB (ref 70–99)
Potassium: 3.6 mmol/L (ref 3.5–5.1)
Sodium: 145 mmol/L (ref 135–145)
TOTAL PROTEIN: 7.6 g/dL (ref 6.5–8.1)

## 2018-08-26 MED ORDER — PROCHLORPERAZINE MALEATE 10 MG PO TABS: 10.0000 mg | ORAL_TABLET | Freq: Four times a day (QID) | ORAL | 1 refills | Status: DC | PRN

## 2018-08-26 MED ORDER — LIDOCAINE-PRILOCAINE 2.5-2.5 % EX CREA: TOPICAL_CREAM | CUTANEOUS | 3 refills | Status: DC

## 2018-08-26 NOTE — Progress Notes (Signed)
Radiation Oncology         [phone]) [phone] ________________________________  Initial outpatient Consultation  Name: [name] [name] MRN: [id number]  Date: 08/26/2018  DOB: 1946-11-03  AG:TXMIWO, [name] Moll, MD  [name] Kussmaul, MD   REFERRING PHYSICIAN: Autumn Messing III, MD  DIAGNOSIS:    ICD-10-CM   1. Malignant neoplasm of overlapping sites of right breast in female, estrogen receptor positive ([place]) C50.811    Z17.0    Cancer Staging Malignant neoplasm of overlapping sites of right breast in female, estrogen receptor positive ([place]) Staging form: Breast, AJCC 8th Edition - Clinical stage from 08/26/2018: Stage IIIB (cT4b, cN1, cM0, G2, ER+, PR-, HER2-) - Unsigned   CHIEF COMPLAINT: Here to discuss management of right breast cancer  HISTORY OF PRESENT ILLNESS::[name] [name] is a 72 y.o. female who presented with breast abnormality on self-exam - she recalls that the abnormality was not noticeable to her for more than a couple months.  The right breast felt hard and somewhat itchy.  She underwent mammography upon bringing this up to her physician's attention - mammography revealed skin thickening and a 2.5 cm mass at 1:00 in the upper inner quadrant of the right breast.  On ultrasound there was a hypoechoic mass in the same region measuring 2.7 cm.  There is also a hypoechoic mass at 9:00 of the right breast measuring 3.8 cm.  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  She had a stroke in 2018 which has led to some residual right-sided motor deficits.  She reports that she has blurry vision.  She reports that she is status post total hysterectomy with BSO in her early 14s.  She denies prior hormone replacement therapy.   PREVIOUS RADIATION THERAPY: No  PAST MEDICAL HISTORY:  has  a past medical history of Carotid stenosis, Hypertension, and Stroke ([place]).    PAST SURGICAL HISTORY: Past Surgical History:  Procedure Laterality Date  . ENDARTERECTOMY Left 04/08/2017   Procedure: ENDARTERECTOMY CAROTID;  Surgeon: [name] Sandy, MD;  Location: [place];  Service: Vascular;  Laterality: Left;  . PATCH ANGIOPLASTY Left 04/08/2017   Procedure: PATCH ANGIOPLASTY Left Carotid;  Surgeon: [name] Sandy, MD;  Location: [place];  Service: Vascular;  Laterality: Left;    FAMILY HISTORY: family history includes Cancer in her mother.  SOCIAL HISTORY:  reports that she has never smoked. She has never used smokeless tobacco. She reports that she does not drink alcohol or use drugs.  ALLERGIES: Patient has no known allergies.  MEDICATIONS:  Current Outpatient Medications  Medication Sig Dispense Refill  . amLODipine (NORVASC) 10 MG tablet Take 1 tablet (10 mg total) by mouth daily. PATIENT MUST HAVE OFFICE VISIT AND LABS PRIOR TO ANY FURTHER REFILLS 15 tablet 0  . atorvastatin (LIPITOR) 80 MG tablet Take 1 tablet (80 mg total) by mouth daily at 6 PM. 30 tablet 0  . clopidogrel (PLAVIX) 75 MG tablet TAKE 1 TABLET BY MOUTH EVERY DAY 30 tablet 1  . lidocaine-prilocaine (EMLA) cream Apply to affected area once 30 g 3  . lisinopril (PRINIVIL,ZESTRIL) 20 MG tablet TAKE 1 TABLET BY MOUTH EVERY DAY 30 tablet 2  . lisinopril-hydrochlorothiazide (PRINZIDE,ZESTORETIC) 20-12.5 MG tablet Take 1 tablet by mouth daily.  11  . pantoprazole (PROTONIX) 40 MG tablet TAKE 1 TABLET BY  MOUTH EVERY DAY 30 tablet 1  . prochlorperazine (COMPAZINE) 10 MG tablet Take 1 tablet (10 mg total) by mouth every 6 (six) hours as needed (Nausea or vomiting). 30 tablet 1  . traZODone (DESYREL) 50 MG tablet Take 0.5-1 tablets (25-50 mg total) by mouth at bedtime as needed for sleep. 15 tablet 0   No current facility-administered medications for this encounter.     REVIEW OF SYSTEMS: A 10+ POINT  REVIEW OF SYSTEMS WAS OBTAINED including neurology, dermatology, psychiatry, cardiac, respiratory, lymph, extremities, GI, GU, Musculoskeletal, constitutional, breasts, reproductive, HEENT.  All pertinent positives are noted in the HPI.  All others are negative.   PHYSICAL EXAM:  Vitals with BMI 08/26/2018  Height 5' 6.5"  Weight 170 lbs 11 oz  BMI 09.40  Systolic 768  Diastolic 58  Pulse 088  Respirations 18   General: Alert and oriented, in no acute distress HEENT: Head is normocephalic. Extraocular movements are intact. Oropharynx is clear. Neck: Neck is supple, no palpable cervical or supraclavicular lymphadenopathy. Heart: Regular in rate and rhythm  Chest: Clear to auscultation bilaterally, with no rhonchi, wheezes, or rales. Abdomen: Soft, nontender, nondistended, with no rigidity or guarding. Extremities: No cyanosis or edema. Lymphatics: see Neck Exam Skin: No concerning lesions. Musculoskeletal: needs help getting onto examination table Neurologic: Cranial nerves II through XII are grossly intact.  Decreased strength in the right hand  Psychiatric: Judgment and insight are intact. Affect is appropriate. Breasts: Please see photo from Dr. Virgie [name] note.  The entire right breast is firm.  She has right nipple retraction.  There is hyperpigmentation over the right breast and erythema concentrated in the upper outer quadrant.  There is a ~1.5-2 cm palpable node in the right axilla. No other palpable masses appreciated in the left breast breast or axilla   ECOG = 2  0 - Asymptomatic (Fully active, able to carry on all predisease activities without restriction)  1 - Symptomatic but completely ambulatory (Restricted in physically strenuous activity but ambulatory and able to carry out work of a light or sedentary nature. For example, light housework, office work)  2 - Symptomatic, <50% in bed during the day (Ambulatory and capable of all self care but unable to carry out any  work activities. Up and about more than 50% of waking hours)  3 - Symptomatic, >50% in bed, but not bedbound (Capable of only limited self-care, confined to bed or chair 50% or more of waking hours)  4 - Bedbound (Completely disabled. Cannot carry on any self-care. Totally confined to bed or chair)  5 - Death   Eustace Pen MM, Creech RH, Tormey DC, et al. [phone]). "Toxicity and response criteria of the [facility]". [place] Oncol. 5 (6): 649-55   LABORATORY DATA:  Lab Results  Component Value Date   WBC 5.3 08/26/2018   HGB 11.5 (L) 08/26/2018   HCT 37.1 08/26/2018   MCV 81.4 08/26/2018   PLT 199 08/26/2018   CMP     Component Value Date/Time   NA 145 08/26/2018 1235   K 3.6 08/26/2018 1235   CL 106 08/26/2018 1235   CO2 30 08/26/2018 1235   GLUCOSE 136 (H) 08/26/2018 1235   BUN 16 08/26/2018 1235   CREATININE 1.08 (H) 08/26/2018 1235   CALCIUM 9.5 08/26/2018 1235   PROT 7.6 08/26/2018 1235   ALBUMIN 3.8 08/26/2018 1235   AST 15 08/26/2018 1235   ALT 20 08/26/2018 1235   ALKPHOS 91 08/26/2018 1235  BILITOT 0.4 08/26/2018 1235   GFRNONAA 51 (L) 08/26/2018 1235   GFRAA 59 (L) 08/26/2018 1235        RADIOGRAPHY: as above    IMPRESSION/PLAN: locally advanced breast cancer, right  Plan for staging scans, then neoadjuvant chemotherapy.   Likely mastectomy to follow   It was a pleasure meeting the patient today. We discussed the risks, benefits, and side effects of radiotherapy. I recommend radiotherapy to the right chest wall and regional nodes to reduce her risk of locoregional recurrence by 2/3.  We discussed that radiation would take approximately 6-7 weeks to complete and that I would give the patient a few weeks to heal following surgery before starting treatment planning.  We spoke about acute effects including skin irritation and fatigue as well as much less common late effects including internal organ injury or irritation. We spoke about the  latest technology that is used to minimize the risk of late effects for patients undergoing radiotherapy to the breast or chest wall. No guarantees of treatment were given. The patient is enthusiastic about proceeding with treatment. I look forward to participating in the patient's care.  I will await her referral back to me for postoperative follow-up and eventual CT simulation/treatment planning.  __________________________________________   Eppie Gibson, MD

## 2018-08-26 NOTE — Telephone Encounter (Signed)
Appt scheduled LMVM date/time per GM per 10/16 sch msg

## 2018-08-26 NOTE — Patient Instructions (Signed)

## 2018-08-26 NOTE — Telephone Encounter (Signed)
Per 10/16 no los

## 2018-08-26 NOTE — Progress Notes (Signed)
START OFF PATHWAY REGIMEN - Breast   [id number]:CMF (IV cyclophosphamide) q21 days:   A cycle is every 21 days:     Cyclophosphamide      Methotrexate      5-Fluorouracil   **Always confirm dose/schedule in your pharmacy ordering system**  Patient Characteristics: Preoperative or Nonsurgical Candidate (Clinical Staging), Neoadjuvant Therapy followed by Surgery, Invasive Disease, Chemotherapy, HER2 Negative/Unknown/Equivocal, ER Positive Therapeutic Status: Preoperative or Nonsurgical Candidate (Clinical Staging) AJCC M Category: cM0 AJCC Grade: G2 Breast Surgical Plan: Neoadjuvant Therapy followed by Surgery ER Status: Positive (+) AJCC 8 Stage Grouping: IIIB HER2 Status: Negative (-) AJCC T Category: cT4 AJCC N Category: cN1 PR Status: Negative (-) Intent of Therapy: Curative Intent, Discussed with Patient

## 2018-08-26 NOTE — Therapy (Signed)
[place], [place], [address] Phone: [phone]   Fax:  [phone]  Physical Therapy Evaluation  Patient Details  Name: [name] [name] MRN: [id number] Date of Birth: July 31, 1946 Referring Provider (PT): Dr.Paul Joaquin Courts Date: 08/26/2018  PT End of Session - 08/26/18 1538    Visit Number  1    Number of Visits  1    PT Start Time  1504    PT Stop Time  1530    PT Time Calculation (min)  26 min    Activity Tolerance  Patient tolerated treatment well    Behavior During Therapy  [facility] for tasks assessed/performed;Flat affect       Past Medical History:  Diagnosis Date  . Carotid stenosis   . Hypertension   . Stroke [facility])     Past Surgical History:  Procedure Laterality Date  . ENDARTERECTOMY Left 04/08/2017   Procedure: ENDARTERECTOMY CAROTID;  Surgeon: Waynetta Sandy, MD;  Location: [place];  Service: Vascular;  Laterality: Left;  . PATCH ANGIOPLASTY Left 04/08/2017   Procedure: PATCH ANGIOPLASTY Left Carotid;  Surgeon: Waynetta Sandy, MD;  Location: [place];  Service: Vascular;  Laterality: Left;    There were no vitals filed for this visit.   Subjective Assessment - 08/26/18 1530    Subjective  Patient reports she is here today to be seen by her medical team for her newly diagnosed right breast cancer.    Patient is accompained by:  Family member    Pertinent History  Patient was diagnosed around 08/11/18 with right invasive lobular caricnoma breast cancer. There are 3 masses in her breast which has now effected her skin. In the upper outer quadrant, one area measures 5 mm and another 3.8 cm. In the upper inner quadrant a mass measures 2.5 cm.  It is ER positive, PR negative, and HER2 negative with a ki67 of 20%. She has a history of a CVA in 2018 resulting in right sided deficits and cognitive changes. She is currently on blood thinner medication.     Patient Stated Goals   Reduce lymphedema risk and learn post op shoulder ROM HEP    Currently in Pain?  No/denies         [facility] PT Assessment - 08/26/18 0001      Assessment   Medical Diagnosis  Right breast cancer    Referring Provider (PT)  Dr.Paul Marlou Starks    Onset Date/Surgical Date  08/11/18    Hand Dominance  Right    Prior Therapy  Yes for CVA in 2018      Precautions   Precautions  Other (comment)    Precaution Comments  Active cancer; hx CVA      Restrictions   Weight Bearing Restrictions  No      Balance Screen   Has the patient fallen in the past 6 months  No    Has the patient had a decrease in activity level because of a fear of falling?   No    Is the patient reluctant to leave their home because of a fear of falling?   No      Home Environment   Living Environment  Private residence    Living Arrangements  Children   Lives with her daughter   Available Help at Discharge  Family      Prior Function   Level of Independence  Independent with basic ADLs    Vocation  Retired  Leisure  She walks 30 min 3x/week      Posture/Postural Control   Posture/Postural Control  Postural limitations    Postural Limitations  Rounded Shoulders;Forward head;Flexed trunk      ROM / Strength   AROM / PROM / Strength  AROM      AROM   Overall AROM Comments  Right UE limited by CVA    AROM Assessment Site  Shoulder;Cervical    Right/Left Shoulder  Right;Left    Right Shoulder Extension  40 Degrees    Right Shoulder Flexion  102 Degrees    Right Shoulder ABduction  103 Degrees    Right Shoulder Internal Rotation  40 Degrees    Right Shoulder External Rotation  56 Degrees    Left Shoulder Extension  49 Degrees    Left Shoulder Flexion  111 Degrees    Left Shoulder ABduction  133 Degrees    Left Shoulder Internal Rotation  77 Degrees    Left Shoulder External Rotation  75 Degrees    Cervical Flexion  WNL    Cervical Extension  50% limited    Cervical - Right Side Bend  50% limited     Cervical - Left Side Bend  75% limited    Cervical - Right Rotation  50% limited    Cervical - Left Rotation  50% limited        LYMPHEDEMA/ONCOLOGY QUESTIONNAIRE - 08/26/18 1536      Type   Cancer Type  Right breast cancer      Lymphedema Assessments   Lymphedema Assessments  Upper extremities      Right Upper Extremity Lymphedema   10 cm Proximal to Olecranon Process  27.6 cm    Olecranon Process  24.1 cm    10 cm Proximal to Ulnar Styloid Process  18.5 cm    Just Proximal to Ulnar Styloid Process  15.2 cm    Across Hand at [employer]  18 cm    At [place] of 2nd Digit  6.4 cm      Left Upper Extremity Lymphedema   10 cm Proximal to Olecranon Process  27.9 cm    Olecranon Process  24.1 cm    10 cm Proximal to Ulnar Styloid Process  18.5 cm    Just Proximal to Ulnar Styloid Process  15.2 cm    Across Hand at [employer]  18 cm    At [place] of 2nd Digit  6.4 cm             Objective measurements completed on examination: See above findings.       Patient was instructed today in a home exercise program today for post op shoulder range of motion. These included active assist shoulder flexion in sitting, scapular retraction, wall walking with shoulder abduction, and hands behind head external rotation.  She was encouraged to do these twice a day, holding 3 seconds and repeating 5 times when permitted by her physician.           PT Education - 08/26/18 1537    Education Details  Lymphedema risk reduction and post op shoulder ROM HEP    Person(s) Educated  Patient;Child(ren)    Methods  Explanation;Demonstration;Handout    Comprehension  Returned demonstration;Verbalized understanding           Breast Clinic Goals - 08/26/18 1543      Patient will be able to verbalize understanding of pertinent lymphedema risk reduction practices relevant to her diagnosis specifically  related to skin care.   Time  1    Period  Days    Status  Achieved       Patient will be able to return demonstrate and/or verbalize understanding of the post-op home exercise program related to regaining shoulder range of motion.   Time  1    Period  Days    Status  Achieved      Patient will be able to verbalize understanding of the importance of attending the postoperative After Breast Cancer Class for further lymphedema risk reduction education and therapeutic exercise.   Time  1    Period  Days    Status  Achieved            Plan - 08/26/18 1538    Clinical Impression Statement  Patient was diagnosed around 08/11/18 with right invasive lobular caricnoma breast cancer. There are 3 masses in her breast which has now effected her skin. In the upper outer quadrant, one area measures 5 mm and another 3.8 cm. In the upper inner quadrant a mass measures 2.5 cm.  It is ER positive, PR negative, and HER2 negative with a ki67 of 20%. She has a history of a CVA in 2018 resulting in right sided deficits and cognitive changes. She is currently on blood thinner medication.  Her multidisciplinary medical team met prior to her assessments to determine a recommended treatment plan. She is planning to have neoadjuvant chemotherapy followed by a right mastectomy and either sentinel node biopsy or axillary lymph node dissection and then radiation and anti-estrogen therapy. She will benefit from post op PT to regain shoulder ROM and reduce lymphedema risk.    History and Personal Factors relevant to plan of care:  CVA 2018 resulitng in right side deficits; lives with daughter but reports she wants to move back home alone in January 2020    Clinical Presentation  Evolving    Clinical Presentation due to:  Unknown extent of disease until staging scans are performed.    Clinical Decision Making  Moderate    Rehab Potential  Good    Clinical Impairments Affecting Rehab Potential  Prior CVA    PT Frequency  One time visit    PT Treatment/Interventions  ADLs/Self Care Home  Management;Therapeutic exercise;Patient/family education    PT Next Visit Plan  Will reassess if surgeon refers back to PT after surgery    PT Home Exercise Plan  Post op shoulder ROM HEP    Consulted and Agree with Plan of Care  Patient;Family member/caregiver    Family Member Consulted  Daughter       Patient will benefit from skilled therapeutic intervention in order to improve the following deficits and impairments:  Postural dysfunction, Decreased range of motion, Difficulty walking, Decreased strength, Impaired UE functional use, Decreased knowledge of precautions, Decreased endurance  Visit Diagnosis: Malignant neoplasm of overlapping sites of right breast in female, estrogen receptor positive ([place]) - Plan: PT plan of care cert/re-cert  Abnormal posture - Plan: PT plan of care cert/re-cert  Muscle weakness (generalized) - Plan: PT plan of care cert/re-cert   Patient will follow up at outpatient cancer rehab 3-4 weeks following surgery.  If the patient requires physical therapy at that time, a specific plan will be dictated and sent to the referring physician for approval. The patient was educated today on appropriate basic range of motion exercises to begin post operatively and the importance of attending the After Breast Cancer class following surgery.  Patient  was educated today on lymphedema risk reduction practices as it pertains to recommendations that will benefit the patient immediately following surgery.  She verbalized good understanding.      Problem List Patient Active Problem List   Diagnosis Date Noted  . Inflammatory breast cancer, right ([place]) 08/26/2018  . Malignant neoplasm of overlapping sites of right breast in female, estrogen receptor positive ([place]) 08/24/2018  . Glucose intolerance (impaired glucose tolerance) 05/01/2017  . Depression 05/01/2017  . Hypertensive crisis   . Acute bilat watershed infarction [facility]) 04/09/2017  . Intracranial vascular stenosis    . 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 Post-operative pain   . S/P carotid endarterectomy   . Essential hypertension   . Stenosis of left carotid artery   . Hyperlipidemia   . Gait disturbance, post-stroke 04/02/2017  . Right arm weakness   . Slowness and poor responsiveness   . Hypertensive emergency 04/01/2017  . Hypokalemia 04/01/2017  . Hyperglycemia 04/01/2017  . Stroke-like symptoms 04/01/2017  . 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 ([place]) 04/01/2017   Annia Friendly, PT 08/26/18 3:45 PM  [place], [place], [address] Phone: [phone]   Fax:  [phone]  Name: [name] [name] MRN: [id number] Date of Birth: [birth year]

## 2018-08-26 NOTE — Progress Notes (Signed)
[facility] Psychosocial Distress Screening Spiritual Care  Met with [name] [name] in Breast Multidisciplinary Clinic to introduce Support Center team/resources, reviewing distress screen per protocol.  The patient scored a 0 on the Psychosocial Distress Thermometer which indicates no distress. Also assessed for distress and other psychosocial needs.   ONCBCN DISTRESS SCREENING 08/26/2018  Screening Type Initial Screening  Distress experienced in past week (1-10) 0  Emotional problem type Adjusting to illness  Information Concerns Type Lack of info about diagnosis;Lack of info about treatment  Physical Problem type Sleep/insomnia  Referral to support programs Yes   The patient presented to Breast Clinic with her daughter. The patient expressed feeling "overwhelmed." The patient reported that she is still processing the information she received about her treatment and diagnosis, and she feels some fear of how she will tolerate the treatment. The patient's daughter expressed interest in learning more about at home medical care assistance. The counselor provided a referral to clinical social work. The patient expressed no current need for Patient and Family Support Center resources, but she will reach out if a need arises.   Follow up needed: No.   , Counseling Intern [phone]     

## 2018-08-28 ENCOUNTER — Telehealth: Payer: Self-pay | Admitting: *Deleted

## 2018-08-28 NOTE — Telephone Encounter (Signed)
  Oncology Nurse Navigator Documentation  Navigator Location: [facility] (08/28/18 1300)   )Navigator Encounter Type: Telephone;[facility]C Follow-up (08/28/18 1300) Telephone: Outgoing Call;Clinic/[facility]C Follow-up (08/28/18 1300)                                                  Time Spent with Patient: 15 (08/28/18 1300)

## 2018-08-31 ENCOUNTER — Encounter: Payer: Self-pay | Admitting: Neurology

## 2018-08-31 ENCOUNTER — Encounter: Payer: Self-pay | Admitting: Radiation Oncology

## 2018-08-31 ENCOUNTER — Telehealth: Payer: Self-pay | Admitting: Neurology

## 2018-08-31 DIAGNOSIS — C50911 Malignant neoplasm of unspecified site of right female breast: Secondary | ICD-10-CM | POA: Diagnosis not present

## 2018-08-31 DIAGNOSIS — I639 Cerebral infarction, unspecified: Secondary | ICD-10-CM | POA: Diagnosis not present

## 2018-08-31 DIAGNOSIS — I10 Essential (primary) hypertension: Secondary | ICD-10-CM | POA: Diagnosis not present

## 2018-08-31 DIAGNOSIS — E7800 Pure hypercholesterolemia, unspecified: Secondary | ICD-10-CM | POA: Diagnosis not present

## 2018-08-31 NOTE — Telephone Encounter (Signed)
Dear Dr. Marlou Starks:  Patient may stop plavix 5 days prior to her incoming port placement procedure. However, pt has to be aware that it carries a [place] but acceptable peri-operative risk for TIA/stroke while off plavix. If it is appropriate, we recommend ASA 81mg  daily for the days pt is off plavix. plavix can be resumed post-procedure if hemodynamically stable. We also recommend to avoid hypotension perioperatively due to cerebral artery stenosis. Thank you.  Rosalin Hawking, MD PhD Stroke Neurology 08/31/2018 4:50 PM

## 2018-09-01 ENCOUNTER — Encounter (HOSPITAL_COMMUNITY): Payer: Self-pay

## 2018-09-01 ENCOUNTER — Ambulatory Visit (HOSPITAL_COMMUNITY)
Admission: RE | Admit: 2018-09-01 | Discharge: 2018-09-01 | Disposition: A | Discharge status: Home / Self Care | Payer: Medicare Other | Source: Ambulatory Visit | Attending: Oncology | Admitting: Oncology

## 2018-09-01 DIAGNOSIS — M5136 Other intervertebral disc degeneration, lumbar region: Secondary | ICD-10-CM | POA: Insufficient documentation

## 2018-09-01 DIAGNOSIS — C50911 Malignant neoplasm of unspecified site of right female breast: Secondary | ICD-10-CM | POA: Diagnosis not present

## 2018-09-01 DIAGNOSIS — N6489 Other specified disorders of breast: Secondary | ICD-10-CM | POA: Diagnosis not present

## 2018-09-01 DIAGNOSIS — N200 Calculus of kidney: Secondary | ICD-10-CM | POA: Diagnosis not present

## 2018-09-01 DIAGNOSIS — I700 Atherosclerosis of aorta: Secondary | ICD-10-CM | POA: Diagnosis not present

## 2018-09-01 DIAGNOSIS — M47816 Spondylosis without myelopathy or radiculopathy, lumbar region: Secondary | ICD-10-CM | POA: Insufficient documentation

## 2018-09-01 DIAGNOSIS — I2510 Atherosclerotic heart disease of native coronary artery without angina pectoris: Secondary | ICD-10-CM | POA: Diagnosis not present

## 2018-09-01 DIAGNOSIS — R918 Other nonspecific abnormal finding of lung field: Secondary | ICD-10-CM | POA: Diagnosis not present

## 2018-09-01 DIAGNOSIS — C50811 Malignant neoplasm of overlapping sites of right female breast: Secondary | ICD-10-CM

## 2018-09-01 DIAGNOSIS — Z170 Estrogen receptor positive status [ER+]: Secondary | ICD-10-CM | POA: Insufficient documentation

## 2018-09-01 MED ORDER — SODIUM CHLORIDE 0.9 % IJ SOLN
INTRAMUSCULAR | Status: AC
  Filled 2018-09-01: qty 50

## 2018-09-01 MED ORDER — IOHEXOL 300 MG/ML  SOLN
100.0000 mL | Freq: Once | INTRAMUSCULAR | Status: AC | PRN
  Administered 2018-09-01: 100 mL via INTRAVENOUS

## 2018-09-02 ENCOUNTER — Telehealth: Payer: Self-pay | Admitting: *Deleted

## 2018-09-02 NOTE — Telephone Encounter (Signed)
Received call from daughter [name] requesting appts for this week to be r/s. Called [name] and confirmed new appt dates and time for future appts. Denies further needs or questions at this time.

## 2018-09-03 ENCOUNTER — Other Ambulatory Visit: Payer: Self-pay

## 2018-09-03 ENCOUNTER — Other Ambulatory Visit: Payer: Self-pay | Admitting: Oncology

## 2018-09-03 ENCOUNTER — Encounter (HOSPITAL_COMMUNITY): Payer: Self-pay | Admitting: *Deleted

## 2018-09-03 ENCOUNTER — Other Ambulatory Visit: Payer: Medicare Other

## 2018-09-03 ENCOUNTER — Inpatient Hospital Stay: Payer: Medicare Other

## 2018-09-03 DIAGNOSIS — C50811 Malignant neoplasm of overlapping sites of right female breast: Secondary | ICD-10-CM | POA: Diagnosis not present

## 2018-09-03 NOTE — Progress Notes (Unsigned)
I called [name] [name] to give her the news that there is no stage IV disease noted on the scans she recently had.  I left a message with her daughter [name] who is the one who is phone we have and the one the patient wishes to be communicated through

## 2018-09-03 NOTE — Progress Notes (Signed)
I spoke to [name] [name], she request that I speak with her daughter, [name] [name], who can give me patient's history and take the instructions. [name] [name] reports that [name] [name] had a stroke 04/08/18.  Patient has "a little weakness in right hand and right leg, and a little memory loss."  Patient ambulates without assistance, she does become short of breath with much ambulation.  Patient's neurologist is Dr Erlinda Hong, patient saw him last in March 2019, he  Released her to care of PCP- Dr Delfina Redwood and Dr Donzetta Matters.- Follow up with Dr Erlinda Hong as needed.

## 2018-09-04 ENCOUNTER — Ambulatory Visit (HOSPITAL_COMMUNITY): Payer: Medicare Other

## 2018-09-04 ENCOUNTER — Inpatient Hospital Stay: Payer: Medicare Other | Admitting: Oncology

## 2018-09-04 ENCOUNTER — Encounter (HOSPITAL_COMMUNITY): Payer: Self-pay | Admitting: Internal Medicine

## 2018-09-04 ENCOUNTER — Encounter (HOSPITAL_COMMUNITY): Payer: Medicare Other

## 2018-09-07 ENCOUNTER — Ambulatory Visit (HOSPITAL_COMMUNITY): Payer: Medicare Other

## 2018-09-07 ENCOUNTER — Ambulatory Visit (HOSPITAL_COMMUNITY): Payer: Medicare Other | Admitting: Certified Registered Nurse Anesthetist

## 2018-09-07 ENCOUNTER — Ambulatory Visit (HOSPITAL_COMMUNITY)
Admission: RE | Admit: 2018-09-07 | Discharge: 2018-09-07 | Disposition: A | Discharge status: Home / Self Care | Payer: Medicare Other | Source: Ambulatory Visit | Attending: General Surgery | Admitting: General Surgery

## 2018-09-07 ENCOUNTER — Other Ambulatory Visit: Payer: Self-pay

## 2018-09-07 ENCOUNTER — Encounter (HOSPITAL_COMMUNITY)
Admission: RE | Disposition: A | Discharge status: Home / Self Care | Payer: Self-pay | Source: Ambulatory Visit | Attending: General Surgery

## 2018-09-07 ENCOUNTER — Other Ambulatory Visit: Payer: Medicare Other

## 2018-09-07 DIAGNOSIS — Z452 Encounter for adjustment and management of vascular access device: Secondary | ICD-10-CM | POA: Diagnosis not present

## 2018-09-07 DIAGNOSIS — C50811 Malignant neoplasm of overlapping sites of right female breast: Secondary | ICD-10-CM | POA: Insufficient documentation

## 2018-09-07 DIAGNOSIS — I10 Essential (primary) hypertension: Secondary | ICD-10-CM | POA: Insufficient documentation

## 2018-09-07 DIAGNOSIS — Z7982 Long term (current) use of aspirin: Secondary | ICD-10-CM | POA: Insufficient documentation

## 2018-09-07 DIAGNOSIS — Z419 Encounter for procedure for purposes other than remedying health state, unspecified: Secondary | ICD-10-CM

## 2018-09-07 DIAGNOSIS — I69351 Hemiplegia and hemiparesis following cerebral infarction affecting right dominant side: Secondary | ICD-10-CM | POA: Insufficient documentation

## 2018-09-07 DIAGNOSIS — C50911 Malignant neoplasm of unspecified site of right female breast: Secondary | ICD-10-CM | POA: Diagnosis not present

## 2018-09-07 DIAGNOSIS — Z7902 Long term (current) use of antithrombotics/antiplatelets: Secondary | ICD-10-CM | POA: Insufficient documentation

## 2018-09-07 DIAGNOSIS — Z170 Estrogen receptor positive status [ER+]: Secondary | ICD-10-CM | POA: Diagnosis not present

## 2018-09-07 DIAGNOSIS — Z79899 Other long term (current) drug therapy: Secondary | ICD-10-CM | POA: Diagnosis not present

## 2018-09-07 DIAGNOSIS — K219 Gastro-esophageal reflux disease without esophagitis: Secondary | ICD-10-CM | POA: Insufficient documentation

## 2018-09-07 DIAGNOSIS — Z95828 Presence of other vascular implants and grafts: Secondary | ICD-10-CM

## 2018-09-07 DIAGNOSIS — I6522 Occlusion and stenosis of left carotid artery: Secondary | ICD-10-CM | POA: Diagnosis not present

## 2018-09-07 DIAGNOSIS — F418 Other specified anxiety disorders: Secondary | ICD-10-CM | POA: Insufficient documentation

## 2018-09-07 DIAGNOSIS — C50919 Malignant neoplasm of unspecified site of unspecified female breast: Secondary | ICD-10-CM | POA: Diagnosis not present

## 2018-09-07 DIAGNOSIS — E785 Hyperlipidemia, unspecified: Secondary | ICD-10-CM | POA: Diagnosis not present

## 2018-09-07 HISTORY — DX: Anxiety disorder, unspecified: F41.9

## 2018-09-07 HISTORY — DX: Major depressive disorder, single episode, unspecified: F32.9

## 2018-09-07 HISTORY — DX: Gastro-esophageal reflux disease without esophagitis: K21.9

## 2018-09-07 HISTORY — DX: Depression, unspecified: F32.A

## 2018-09-07 HISTORY — DX: Malignant (primary) neoplasm, unspecified: C80.1

## 2018-09-07 HISTORY — DX: Dyspnea, unspecified: R06.00

## 2018-09-07 HISTORY — PX: PORTACATH PLACEMENT: SHX2246

## 2018-09-07 SURGERY — INSERTION, TUNNELED CENTRAL VENOUS DEVICE, WITH PORT
Anesthesia: General | Laterality: Left

## 2018-09-07 MED ORDER — ACETAMINOPHEN 500 MG PO TABS
1000.0000 mg | ORAL_TABLET | ORAL | Status: AC
  Administered 2018-09-07: 1000 mg via ORAL
  Filled 2018-09-07: qty 2

## 2018-09-07 MED ORDER — SODIUM CHLORIDE 0.9 % IV SOLN
INTRAVENOUS | Status: AC
  Filled 2018-09-07: qty 1.2

## 2018-09-07 MED ORDER — ONDANSETRON HCL 4 MG/2ML IJ SOLN
INTRAMUSCULAR | Status: DC | PRN
  Administered 2018-09-07: 4 mg via INTRAVENOUS

## 2018-09-07 MED ORDER — HYDROCODONE-ACETAMINOPHEN 5-325 MG PO TABS: 1.0000 | ORAL_TABLET | Freq: Four times a day (QID) | ORAL | 0 refills | Status: DC | PRN

## 2018-09-07 MED ORDER — SODIUM CHLORIDE 0.9 % IV SOLN
INTRAVENOUS | Status: DC | PRN
  Administered 2018-09-07: 500 mL

## 2018-09-07 MED ORDER — CELECOXIB 200 MG PO CAPS
200.0000 mg | ORAL_CAPSULE | ORAL | Status: AC
  Administered 2018-09-07: 200 mg via ORAL
  Filled 2018-09-07: qty 1

## 2018-09-07 MED ORDER — FENTANYL CITRATE (PF) 250 MCG/5ML IJ SOLN
INTRAMUSCULAR | Status: AC
  Filled 2018-09-07: qty 5

## 2018-09-07 MED ORDER — LIDOCAINE 2% (20 MG/ML) 5 ML SYRINGE
INTRAMUSCULAR | Status: DC | PRN
  Administered 2018-09-07: 60 mg via INTRAVENOUS

## 2018-09-07 MED ORDER — 0.9 % SODIUM CHLORIDE (POUR BTL) OPTIME
TOPICAL | Status: DC | PRN
  Administered 2018-09-07: 1000 mL

## 2018-09-07 MED ORDER — CEFAZOLIN SODIUM-DEXTROSE 2-4 GM/100ML-% IV SOLN
2.0000 g | INTRAVENOUS | Status: AC
  Administered 2018-09-07: 2 g via INTRAVENOUS
  Filled 2018-09-07: qty 100

## 2018-09-07 MED ORDER — HEPARIN SOD (PORK) LOCK FLUSH 100 UNIT/ML IV SOLN
INTRAVENOUS | Status: DC | PRN
  Administered 2018-09-07: 500 [IU]

## 2018-09-07 MED ORDER — BUPIVACAINE HCL (PF) 0.25 % IJ SOLN
INTRAMUSCULAR | Status: DC | PRN
  Administered 2018-09-07: 10 mL

## 2018-09-07 MED ORDER — LACTATED RINGERS IV SOLN
INTRAVENOUS | Status: DC
  Administered 2018-09-07: 12:00:00 via INTRAVENOUS

## 2018-09-07 MED ORDER — CHLORHEXIDINE GLUCONATE CLOTH 2 % EX PADS: 6.0000 | MEDICATED_PAD | Freq: Once | CUTANEOUS | Status: DC

## 2018-09-07 MED ORDER — PROPOFOL 10 MG/ML IV BOLUS
INTRAVENOUS | Status: DC | PRN
  Administered 2018-09-07: 100 mg via INTRAVENOUS
  Administered 2018-09-07 (×2): 50 mg via INTRAVENOUS

## 2018-09-07 MED ORDER — BUPIVACAINE HCL (PF) 0.25 % IJ SOLN
INTRAMUSCULAR | Status: AC
  Filled 2018-09-07: qty 30

## 2018-09-07 MED ORDER — PROPOFOL 10 MG/ML IV BOLUS
INTRAVENOUS | Status: AC
  Filled 2018-09-07: qty 20

## 2018-09-07 MED ORDER — DEXAMETHASONE SODIUM PHOSPHATE 10 MG/ML IJ SOLN
INTRAMUSCULAR | Status: DC | PRN
  Administered 2018-09-07: 10 mg via INTRAVENOUS

## 2018-09-07 MED ORDER — HEPARIN SOD (PORK) LOCK FLUSH 100 UNIT/ML IV SOLN
INTRAVENOUS | Status: AC
  Filled 2018-09-07: qty 5

## 2018-09-07 MED ORDER — PHENYLEPHRINE HCL 10 MG/ML IJ SOLN
INTRAMUSCULAR | Status: DC | PRN
  Administered 2018-09-07 (×2): 80 ug via INTRAVENOUS

## 2018-09-07 MED ORDER — FENTANYL CITRATE (PF) 250 MCG/5ML IJ SOLN
INTRAMUSCULAR | Status: DC | PRN
  Administered 2018-09-07: 25 ug via INTRAVENOUS
  Administered 2018-09-07: 75 ug via INTRAVENOUS
  Administered 2018-09-07: 25 ug via INTRAVENOUS

## 2018-09-07 MED ORDER — GABAPENTIN 300 MG PO CAPS
300.0000 mg | ORAL_CAPSULE | ORAL | Status: AC
  Administered 2018-09-07: 300 mg via ORAL
  Filled 2018-09-07: qty 1

## 2018-09-07 MED ORDER — LACTATED RINGERS IV SOLN
INTRAVENOUS | Status: DC
  Administered 2018-09-07: 10:00:00 via INTRAVENOUS

## 2018-09-07 SURGICAL SUPPLY — 42 items
ADH SKN CLS APL DERMABOND .7 (GAUZE/BANDAGES/DRESSINGS) ×1
BAG DECANTER FOR FLEXI CONT (MISCELLANEOUS) ×3 IMPLANT
CHLORAPREP W/TINT 10.5 ML (MISCELLANEOUS) ×3 IMPLANT
COVER SURGICAL LIGHT HANDLE (MISCELLANEOUS) ×3 IMPLANT
COVER TRANSDUCER ULTRASND GEL (DRAPE) ×2 IMPLANT
COVER WAND RF STERILE (DRAPES) ×3 IMPLANT
CRADLE DONUT ADULT HEAD (MISCELLANEOUS) ×3 IMPLANT
DERMABOND ADVANCED (GAUZE/BANDAGES/DRESSINGS) ×2
DERMABOND ADVANCED .7 DNX12 (GAUZE/BANDAGES/DRESSINGS) ×1 IMPLANT
DRAPE C-ARM 42X72 X-RAY (DRAPES) ×3 IMPLANT
DRAPE CHEST BREAST 15X10 FENES (DRAPES) ×3 IMPLANT
ELECT CAUTERY BLADE 6.4 (BLADE) ×3 IMPLANT
ELECT REM PT RETURN 9FT ADLT (ELECTROSURGICAL) ×3
ELECTRODE REM PT RTRN 9FT ADLT (ELECTROSURGICAL) ×1 IMPLANT
GAUZE 4X4 16PLY RFD (DISPOSABLE) ×3 IMPLANT
GEL ULTRASOUND 20GR AQUASONIC (MISCELLANEOUS) IMPLANT
GLOVE BIO SURGEON STRL SZ7.5 (GLOVE) ×3 IMPLANT
GLOVE BIOGEL PI IND STRL 6.5 (GLOVE) IMPLANT
GLOVE BIOGEL PI IND STRL 7.0 (GLOVE) IMPLANT
GLOVE BIOGEL PI IND STRL 7.5 (GLOVE) IMPLANT
GLOVE BIOGEL PI INDICATOR 6.5 (GLOVE) ×2
GLOVE BIOGEL PI INDICATOR 7.0 (GLOVE) ×2
GLOVE BIOGEL PI INDICATOR 7.5 (GLOVE) ×2
GOWN STRL REUS W/ TWL LRG LVL3 (GOWN DISPOSABLE) ×2 IMPLANT
GOWN STRL REUS W/TWL LRG LVL3 (GOWN DISPOSABLE) ×9
KIT BASIN OR (CUSTOM PROCEDURE TRAY) ×3 IMPLANT
KIT PORT POWER 8FR ISP CVUE (Port) ×2 IMPLANT
KIT TURNOVER KIT B (KITS) ×3 IMPLANT
NEEDLE 22X1 1/2 (OR ONLY) (NEEDLE) ×2 IMPLANT
NS IRRIG 1000ML POUR BTL (IV SOLUTION) ×3 IMPLANT
PAD ARMBOARD 7.5X6 YLW CONV (MISCELLANEOUS) ×3 IMPLANT
PENCIL BUTTON HOLSTER BLD 10FT (ELECTRODE) ×3 IMPLANT
SHEATH COOK PEEL AWAY SET 9F (SHEATH) IMPLANT
SUT MNCRL AB 4-0 PS2 18 (SUTURE) ×3 IMPLANT
SUT PROLENE 2 0 SH 30 (SUTURE) ×1 IMPLANT
SUT VIC AB 3-0 SH 27 (SUTURE) ×9
SUT VIC AB 3-0 SH 27XBRD (SUTURE) ×1 IMPLANT
SYR 10ML LL (SYRINGE) ×2 IMPLANT
SYR 5ML LUER SLIP (SYRINGE) ×3 IMPLANT
TOWEL OR 17X24 6PK STRL BLUE (TOWEL DISPOSABLE) ×3 IMPLANT
TOWEL OR 17X26 10 PK STRL BLUE (TOWEL DISPOSABLE) ×3 IMPLANT
TRAY LAPAROSCOPIC MC (CUSTOM PROCEDURE TRAY) ×3 IMPLANT

## 2018-09-07 NOTE — Anesthesia Procedure Notes (Signed)
Procedure Name: LMA Insertion Date/Time: 09/07/2018 12:02 PM Performed by: Dejah Droessler T, CRNA Pre-anesthesia Checklist: Patient identified, Emergency Drugs available, Suction available and Patient being monitored Patient Re-evaluated:Patient Re-evaluated prior to induction Oxygen Delivery Method: Circle system utilized Preoxygenation: Pre-oxygenation with 100% oxygen Induction Type: IV induction Ventilation: Mask ventilation without difficulty LMA: LMA inserted LMA Size: 4.0 Number of attempts: 1 Airway Equipment and Method: Patient positioned with wedge pillow Placement Confirmation: positive ETCO2 and breath sounds checked- equal and bilateral Tube secured with: Tape Dental Injury: Teeth and Oropharynx as per pre-operative assessment

## 2018-09-07 NOTE — Discharge Instructions (Signed)

## 2018-09-07 NOTE — Op Note (Signed)
09/07/2018  1:12 PM  PATIENT:  [name] [name]  72 y.o. female  PRE-OPERATIVE DIAGNOSIS:  INFLAMMATORY RIGHT BREAST CANCER  POST-OPERATIVE DIAGNOSIS:  Infammatory Right Breast Cancer   PROCEDURE:  Procedure(s): INSERTION PORT-A-CATH LEFT INTERNAL JUGULAR VEIN UNDER ULTRASOUND GUIDANCE  SURGEON:  Surgeon(s) and Role:    * [name], MD - Primary  PHYSICIAN ASSISTANT:   ASSISTANTS: none   ANESTHESIA:   local and general  EBL:  minimal   BLOOD ADMINISTERED:none  DRAINS: none   LOCAL MEDICATIONS USED:  MARCAINE     SPECIMEN:  No Specimen  DISPOSITION OF SPECIMEN:  N/A  COUNTS:  YES  TOURNIQUET:  * No tourniquets in log *  DICTATION: .Dragon Dictation   After informed consent was obtained the patient was brought to the operating room and placed in the supine position on the operating room table.  After adequate induction of general anesthesia a roll was placed between the patient's shoulder blades to extend the shoulder slightly.  The left chest and neck area were then prepped with ChloraPrep, allowed to dry, and draped in usual sterile manner.  An appropriate timeout was performed.  The patient was placed in Trendelenburg position.  The area lateral to the bend of the clavicle was infiltrated with quarter percent Marcaine.  A large bore needle from the port kit was used to slide beneath the bend of the clavicle heading towards the sternal notch.  I was unable to identify the left subclavian vein.  After several attempts I decided to move to the neck.  She had significant scar tissue on the left side of her neck from a previous carotid endarterectomy.  I was able to use the ultrasound machine to identify the left internal jugular vein.  I was then able to access that vein with the large bore needle from the port kit under ultrasound visualization without difficulty.  A wire was then fed through the needle using the Seldinger technique without difficulty.  The wire was confirmed  in the central venous system using real-time fluoroscopy.  Next a Clontz incision was made on the left chest wall lateral to the bend of the clavicle with a 15 blade knife.  The incision was carried through the skin and subcutaneous tissue sharply with electrocautery.  A subcutaneous pocket was created inferior to the incision by a combination of blunt finger dissection and some sharp dissection with electrocautery.  Next the incision on the neck where the wire was entering and the incision on the chest were connected by blunt hemostat dissection.  A tunneling device was placed across this tunnel of tissue and used to bring the port tubing through the tunnel.  The tubing was placed on the reservoir and the reservoir was placed in the pocket.  The length of the tubing was again estimated using real-time fluoroscopy.  The tubing was cut to the appropriate length.  Next a sheath and dilator were then fed over the wire using the Seldinger technique without difficulty.  The dilator and wire were removed from the patient.  The tubing was fed through the sheath as far as it would go and then held in place while the sheath was gently cracked and separated.  Another real-time fluoroscopy image showed the tip of the catheter to be in the distal superior vena cava.  The tubing was then permanently anchored to the reservoir.  The reservoir was anchored in the pocket with two 2-0 Prolene stitches.  The port was then aspirated and it  aspirated blood easily.  The port was then flushed initially with a dilute heparin solution and then with a more concentrated heparin solution the incision on the left neck was then closed with an interrupted 4-0 Monocryl subcuticular stitch.  The subcutaneous tissue was closed over the port with interrupted 3-0 Vicryl stitches.  The skin was then closed with a running 4-0 Monocryl subcuticular stitch.  Dermabond dressings were applied to both incisions.  The patient tolerated the procedure well.  At  the end of the case all needle sponge and instrument counts were correct.  The patient was then awakened and taken to recovery in stable condition.  PLAN OF CARE: Discharge to home after PACU  PATIENT DISPOSITION:  PACU - hemodynamically stable.   Delay start of Pharmacological VTE agent (>24hrs) due to surgical blood loss or risk of bleeding: not applicable

## 2018-09-07 NOTE — Anesthesia Postprocedure Evaluation (Signed)
Anesthesia Post Note  Patient: [name] [name]  Procedure(s) Performed: INSERTION PORT-A-CATH LEFT INTERNAL JUGULAR (Left )     Patient location during evaluation: PACU Anesthesia Type: General Level of consciousness: sedated Pain management: pain level controlled Vital Signs Assessment: post-procedure vital signs reviewed and stable Respiratory status: spontaneous breathing Cardiovascular status: stable Postop Assessment: no apparent nausea or vomiting Anesthetic complications: no    Last Vitals:  Vitals:   09/07/18 1333 09/07/18 1348  BP: (!) 151/66 (!) 154/70  Pulse: 79 77  Resp: 13 12  Temp:    SpO2: 100% 100%    Last Pain:  Vitals:   09/07/18 1348  TempSrc:   PainSc: 0-No pain   Pain Goal: Patients Stated Pain Goal: 2 (09/07/18 1007)               Huston Foley

## 2018-09-07 NOTE — Progress Notes (Signed)
Call to Dr. Jillyn Hidden, made aware of BP's this & that pt. Has not had her a.m. Amlodipine. No new orders rec'd.

## 2018-09-07 NOTE — H&P (Signed)
[name] [name]  Location: [facility] Patient #: [id number] DOB: [birth year] Undefined / Language: Cleophus Molt / Race: Black or African American Female   History of Present Illness The patient is a 72 year old female who presents with breast cancer. We are asked to see the patient in consultation by Dr. Isidore Moos to evaluate her for a new right breast cancer. The patient is a 72 year old black female who presents with a mass in the right breast. She has never had a mammogram before. The mass encompassed all quadrants of the right breast. There was 1 abnormal lymph node but it could not be biopsied because of its proximity to a vessel. She has redness and masses in the skin. 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 She did have a stroke last year and has some residual right sided weakness. She had a CEA and is on Plavix.   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 Lipitor (80MG Tablet, Oral) Active. Plavix (75MG Tablet, Oral) Active. Lisinopril-hydroCHLOROthiazide (20-12.5MG Tablet, Oral) Active. Protonix (40MG Tablet DR, Oral) Active. traZODone HCl (50MG Tablet, Oral) Active.    Review of Systems General Not Present- Appetite Loss, Chills, Fatigue, Fever, Night Sweats, Weight Gain and Weight Loss. Note: All other systems negative (unless as noted in HPI & included Review of Systems) Skin Not Present- Change in Wart/Mole, Dryness, Hives, Jaundice, New Lesions, Non-Healing Wounds, Rash and Ulcer. HEENT Not Present- Earache, Hearing Loss, Hoarseness, Nose Bleed, Oral Ulcers, Ringing in the Ears, Seasonal Allergies, Sinus Pain, Sore Throat, Visual Disturbances, Wears glasses/contact lenses and Yellow Eyes. Respiratory Not  Present- Bloody sputum, Chronic Cough, Difficulty Breathing, Snoring and Wheezing. Breast Not Present- Breast Mass, Breast Pain, Nipple Discharge and Skin Changes. Cardiovascular Not Present- Chest Pain, Difficulty Breathing Lying Down, Leg Cramps, Palpitations, Rapid Heart Rate, Shortness of Breath and Swelling of Extremities. Gastrointestinal Not Present- Abdominal Pain, Bloating, Bloody Stool, Change in Bowel Habits, Chronic diarrhea, Constipation, Difficulty Swallowing, Excessive gas, Gets full quickly at meals, Hemorrhoids, Indigestion, Nausea, Rectal Pain and Vomiting. Female Genitourinary Not Present- Frequency, Nocturia, Painful Urination, Pelvic Pain and Urgency. Musculoskeletal Present- Leg Weakness, Muscle Atrophy and Muscle Weakness. Not Present- Back Pain, Joint Pain, Joint Stiffness, Muscle Pain and Swelling of Extremities. Neurological Not Present- Decreased Memory, Fainting, Headaches, Numbness, Seizures, Tingling, Tremor, Trouble walking and Weakness. Psychiatric Not Present- Anxiety, Bipolar, Change in Sleep Pattern, Depression, Fearful and Frequent crying. Endocrine Not Present- Cold Intolerance, Excessive Hunger, Hair Changes, Heat Intolerance, Hot flashes and New Diabetes. Hematology Not Present- Easy Bruising, Excessive bleeding, Gland problems, HIV and Persistent Infections.   Physical Exam  General Mental Status-Alert. General Appearance-Consistent with stated age. Hydration-Well hydrated. Voice-Normal.  Head and Neck Head-normocephalic, atraumatic with no lesions or palpable masses. Trachea-midline. Thyroid Gland Characteristics - normal size and consistency.  Eye Eyeball - Bilateral-Extraocular movements intact. Sclera/Conjunctiva - Bilateral-No scleral icterus.  Chest and Lung Exam Chest and lung exam reveals -quiet, even and easy respiratory effort with no use of accessory muscles and on auscultation, normal breath sounds, no adventitious  sounds and normal vocal resonance. Inspection Chest Wall - Normal. Back - normal.  Breast Note: there is a firm mass encompassing most of the breast. There is redness and masses involving the upper portion of the skin of  the breast and peau d'orange changes along the inferior portion of the breast. There is no palpable mass in the left breast. There is no palpable axillary, supraclavicular, or cervical lymphadenopathy   Cardiovascular Cardiovascular examination reveals -normal heart sounds, regular rate and rhythm with no murmurs(there is a systolic murmur) and normal pedal pulses bilaterally.  Abdomen Inspection Inspection of the abdomen reveals - No Hernias. Skin - Scar - no surgical scars. Palpation/Percussion Palpation and Percussion of the abdomen reveal - Soft, Non Tender, No Rebound tenderness, No Rigidity (guarding) and No hepatosplenomegaly. Auscultation Auscultation of the abdomen reveals - Bowel sounds normal.  Neurologic Neurologic evaluation reveals -alert and oriented x 3 with no impairment of recent or remote memory. Mental Status-Normal.  Musculoskeletal Normal Exam - Left-Upper Extremity Strength Normal and Lower Extremity Strength Normal. Normal Exam - Right-Upper Extremity Strength Normal and Lower Extremity Strength Normal. Note: there is weakness of the right upper and lower extremity   Lymphatic Head & Neck  General Head & Neck Lymphatics: Bilateral - Description - Normal. Axillary  General Axillary Region: Bilateral - Description - Normal. Tenderness - Non Tender. Femoral & Inguinal  Generalized Femoral & Inguinal Lymphatics: Bilateral - Description - Normal. Tenderness - Non Tender.    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 Because of the extent of disease I think she would  benefit from neoadjuvant chemotherapy to try to shrink the tumor. She will need a port for this. I have discussed with her the risks and benefits of this surgery as well as some of the technical aspects including pneumothorax and she understands and wishes to proceed. If she has some response she will then need and mastectomy down the road. Current Plans Follow up with [place] in the office in 6 WEEKS.  Call us sooner as needed.  Addendum Note(Paul S. Toth MD; 08/26/2018 2:55 PM) She is also on plavix for her stroke and she will need clearance from Dr. Delfina Redwood and Dr. Erlinda Hong and she will need to be off plavix for several days for the surgery

## 2018-09-07 NOTE — Transfer of Care (Signed)
Immediate Anesthesia Transfer of Care Note  Patient: [name] [name]  Procedure(s) Performed: INSERTION PORT-A-CATH LEFT INTERNAL JUGULAR (Left )  Patient Location: PACU  Anesthesia Type:General  Level of Consciousness: awake, alert , oriented and patient cooperative  Airway & Oxygen Therapy: Patient Spontanous Breathing and Patient connected to nasal cannula oxygen  Post-op Assessment: Report given to RN, Post -op Vital signs reviewed and stable and Patient moving all extremities  Post vital signs: Reviewed and stable  Last Vitals:  Vitals Value Taken Time  BP 154/64 09/07/2018  1:18 PM  Temp    Pulse 87 09/07/2018  1:19 PM  Resp 14 09/07/2018  1:19 PM  SpO2 100 % 09/07/2018  1:19 PM  Vitals shown include unvalidated device data.  Last Pain:  Vitals:   09/07/18 1318  TempSrc:   PainSc: (P) 0-No pain      Patients Stated Pain Goal: 2 (47/84/12 8208)  Complications: No apparent anesthesia complications

## 2018-09-07 NOTE — Anesthesia Preprocedure Evaluation (Addendum)
Anesthesia Evaluation  Patient identified by MRN, date of birth, ID band Patient awake    Reviewed: Allergy & Precautions, NPO status , Patient's Chart, lab work & pertinent test results  Airway Mallampati: I  TM Distance: >3 FB Neck ROM: Full    Dental no notable dental hx. (+) Teeth Intact, Dental Advisory Given   Pulmonary    Pulmonary exam normal breath sounds clear to auscultation       Cardiovascular hypertension, Pt. on medications Normal cardiovascular exam Rhythm:Regular Rate:Normal  ECHO noted   Neuro/Psych PSYCHIATRIC DISORDERS Anxiety Depression CVA    GI/Hepatic negative GI ROS, Neg liver ROS, GERD  Medicated,  Endo/Other  negative endocrine ROSPossible DM  Renal/GU negative Renal ROS  negative genitourinary   Musculoskeletal negative musculoskeletal ROS (+)   Abdominal Normal abdominal exam  (+)   Peds  Hematology negative hematology ROS (+)   Anesthesia Other Findings Sue Fernicola  VAS US CAROTID DUPLEX BILATERAL  Order# [id number]  Reading physician: Serafina Mitchell, MD Ordering physician: Lelan Pons [name], NP Study date: 08/10/18 Patient Information   Patient Name Evgenia, Merriman Sex Female DOB January 24, 1946 SSN [id number] Study Information   Physician Technologist Supporting Staff  McGonigal, Josefina Do, [place], RCS  Reason for Exam  Priority: Routine  Carotid Dx: Left-sided extracranial carotid artery stenosis (I65.22 (ICD-10-CM)); History of left-sided carotid endarterectomy (Z98.890 (ICD-10-CM)) Comments:  MERGE Images   Show images for VAS US CAROTID Syngo Images   Show images for VAS US CAROTID 08/10/2018 1:13 PM - Interface, Three One Seven   Narrative   Carotid Arterial Duplex Study  Indications: Carotid artery disease, Left carotid endarterectomy 04/08/2017. Risk Factors: Hypertension, hyperlipidemia.  Performing Technologist: Ronal Fear RVS, RCS   Examination  Guidelines: A complete evaluation includes B-mode imaging, spectral Doppler, color Doppler, and power Doppler as needed of all accessible portions of each vessel. Bilateral testing is considered an integral part of a complete examination. Limited examinations for reoccurring indications may be performed as noted.   Right Carotid Findings: +----------+--------+--------+--------+--------+--------+      PSV cm/sEDV cm/sStenosisDescribeComments +----------+--------+--------+--------+--------+--------+ CCA Prox 102   14                +----------+--------+--------+--------+--------+--------+ CCA Mid  123   19                +----------+--------+--------+--------+--------+--------+ CCA Distal133   19                +----------+--------+--------+--------+--------+--------+ ICA Prox 94   13   1-39%  smooth      +----------+--------+--------+--------+--------+--------+ ICA Mid  90   19                +----------+--------+--------+--------+--------+--------+ ICA Distal68   20                +----------+--------+--------+--------+--------+--------+ ECA    348   28                +----------+--------+--------+--------+--------+--------+  +----------+--------+-------+----------------+-------------------+      PSV cm/sEDV cmsDescribe    Arm Pressure (mmHG) +----------+--------+-------+----------------+-------------------+ [id number]       Multiphasic, WNL           +----------+--------+-------+----------------+-------------------+  +---------+--------+--+--------+--+---------+ VertebralPSV cm/s93EDV cm/s22Antegrade +---------+--------+--+--------+--+---------+      Left Carotid Findings: +----------+--------+--------+--------+-----------+--------+       PSV cm/sEDV cm/sStenosisDescribe  Comments +----------+--------+--------+--------+-----------+--------+ CCA Prox 116   19                  +----------+--------+--------+--------+-----------+--------+ CCA Mid  122  22       homogeneous     +----------+--------+--------+--------+-----------+--------+ CCA Distal141   23                  +----------+--------+--------+--------+-----------+--------+ ICA Prox 134   19   1-39%  homogeneous     +----------+--------+--------+--------+-----------+--------+ ICA Mid  108   29                  +----------+--------+--------+--------+-----------+--------+ ICA Distal95   30                  +----------+--------+--------+--------+-----------+--------+ ECA    219   8                   +----------+--------+--------+--------+-----------+--------+   +----------+--------+--------+----------------+-------------------+ SubclavianPSV cm/sEDV cm/sDescribe    Arm Pressure (mmHG) +----------+--------+--------+----------------+-------------------+      182       Multiphasic, WNL           +----------+--------+--------+----------------+-------------------+  +---------+--------+--+--------+--+---------+ VertebralPSV cm/s59EDV cm/s14Antegrade +---------+--------+--+--------+--+---------+   Final Interpretation: Right Carotid: Velocities in the right ICA are consistent with a 1-39% stenosis.  Left Carotid: Velocities in the left ICA are consistent with a 1-39% stenosis.  Vertebrals: Bilateral vertebral arteries demonstrate antegrade flow. Subclavians: Normal flow hemodynamics were seen in bilateral subclavian       arteries.  *See table(s) above for measurements and observations.    Electronically signed by Harold Barban MD on  08/10/2018 at 1:13:36 PM.    Final  Lab and Collection   VAS US CAROTID - 08/10/2018  Result History   VAS US CAROTID on 08/10/2018 Surgical History   Surgical History    No past medical history on file.  Other Surgical History    Procedure Laterality Date Comment Source ABDOMINAL HYSTERECTOMY    Provider ENDARTERECTOMY Left 04/08/2017 Procedure: ENDARTERECTOMY CAROTID; Surgeon: Waynetta Sandy, MD; Location: [place]; Service: Vascular; Laterality: Left; Provider PATCH ANGIOPLASTY Left 04/08/2017 Procedure: PATCH ANGIOPLASTY Left Carotid; Surgeon: Waynetta Sandy, MD; Location: [place]; Service: Vascular; Laterality: Left; Provider  Medical History   Diagnosis Date Comment Source Anxiety   Provider Cancer [facility])  breast cancer- right Provider Carotid stenosis   Provider Depression  situational Provider Dyspnea  with much ambulation Provider GERD (gastroesophageal reflux disease)   Provider Hypertension   Provider Stroke [facility]) 04/01/2017 a little weakness right side leg and hand, a little memry loss Provider Implants    No active implants to display in this view. Vitals   Height Weight BMI (Calculated) 5' 6.5" (1.689 m) 77.4 kg 27.14 Imaging   Imaging Information Exam Information   Status Exam Begun  Exam Ended  Final (99) 08/10/2018 12:03 PM 08/10/2018 12:54 PM Order-Level Documents:   There are no order-level documents.  Encounter-Level Documents - 08/10/2018:   Electronic signature on 08/10/2018 12:01 PM - Signed  Electronic signature on 08/10/2018 12:00 PM - Signed    Signed   Electronically signed by Serafina Mitchell, MD on 08/10/18 at 1313 EDT External Result Report   External Result Report  Sebastian Dzik  ECHO COMPLETE WO IMAGING ENHANCING AGENT  Order# [id number]  Reading physician: Lelon Perla, MD Ordering physician: Dionne Milo, NP Study date: 04/03/17 Study Result   Result status: Final result                              *Wink*                   *  [address] [place], [place] [address]                            [phone]  ------------------------------------------------------------------- Transthoracic Echocardiography  Patient:    Ariyona, Eid MR #:       [id number] Study Date: 04/03/2017 Gender:     F Age:        89 Height:     172.7 cm Weight:     63.5 kg BSA:        1.74 m^2 Pt. Status: Room:       5M19C   Rudi Coco [id number]  ADMITTING    Waldemar Dickens  REFERRING    Dionne Milo  PERFORMING   [facility], Inpatient  SONOGRAPHER  Roseanna Rainbow  cc:  ------------------------------------------------------------------- LV EF: 65% -   70%  ------------------------------------------------------------------- Indications:      CVA 38.  ------------------------------------------------------------------- History:   PMH:  No prior cardiac history.  ------------------------------------------------------------------- Study Conclusions  - Left ventricle: The cavity size was normal. Wall thickness was   increased in a pattern of mild LVH. Systolic function was   vigorous. The estimated ejection fraction was in the range of 65%   to 70%. Wall motion was normal; there were no regional wall   motion abnormalities. Doppler parameters are consistent with   abnormal left ventricular relaxation (grade 1 diastolic   dysfunction). Doppler parameters are consistent with high   ventricular filling pressure. - Mitral valve: Calcified annulus.  Impressions:  - Vigorous LV systolic function; mild LVH; mild diastolic   dysfunction; elevated LV filling pressure; sclerotic aortic valve   with no AS or AI; trace MR and TR.  ------------------------------------------------------------------- Study data:  No prior study was available for  comparison.  Study status:  Routine.  Procedure:  The patient reported no pain pre or post test. Transthoracic echocardiography. Image quality was poor. The study was technically difficult, as a result of poor acoustic windows, poor sound wave transmission, and restricted patient mobility.  Study completion:  There were no complications. Transthoracic echocardiography.  M-mode, complete 2D, spectral Doppler, and color Doppler.  Birthdate:  Patient birthdate: 1946-09-02.  Age:  Patient is 72 yr old.  Sex:  Gender: female. BMI: 21.3 kg/m^2.  Blood pressure:     134/68  Patient status: Inpatient.  Study date:  Study date: 04/03/2017. Study time: 02:45 PM.  Location:  Bedside.  -------------------------------------------------------------------  ------------------------------------------------------------------- Left ventricle:  The cavity size was normal. Wall thickness was increased in a pattern of mild LVH. Systolic function was vigorous. The estimated ejection fraction was in the range of 65% to 70%. Wall motion was normal; there were no regional wall motion abnormalities. Doppler parameters are consistent with abnormal left ventricular relaxation (grade 1 diastolic dysfunction). Doppler parameters are consistent with high ventricular filling pressure.   ------------------------------------------------------------------- Aortic valve:   Trileaflet; mildly calcified leaflets. Mobility was not restricted.  Doppler:  Transvalvular velocity was within the normal range. There was no stenosis. There was no regurgitation.   ------------------------------------------------------------------- Aorta:  Aortic root: The aortic root was normal in size.  -------------------------------------------------------------------  Mitral valve:   Calcified annulus. Mobility was not restricted. Doppler:  Transvalvular velocity was within the normal range. There was no evidence for stenosis. There was  trivial regurgitation. Peak gradient (D): 3 mm Hg.  ------------------------------------------------------------------- Left atrium:  The atrium was normal in size.  ------------------------------------------------------------------- Right ventricle:  The cavity size was normal. Systolic function was normal.  ------------------------------------------------------------------- Pulmonic valve:    Doppler:  Transvalvular velocity was within the normal range. There was no evidence for stenosis.  ------------------------------------------------------------------- Tricuspid valve:   Structurally normal valve.    Doppler: Transvalvular velocity was within the normal range. There was trivial regurgitation.  ------------------------------------------------------------------- Right atrium:  The atrium was normal in size.  ------------------------------------------------------------------- Pericardium:  There was no pericardial effusion.  ------------------------------------------------------------------- Systemic veins: Inferior vena cava: The vessel was normal in size.  ------------------------------------------------------------------- Measurements   Left ventricle                         Value        Reference  LV ID, ED, PLAX chordal        (L)     35.4  mm     43 - 52  LV ID, ES, PLAX chordal        (L)     20.2  mm     23 - 38  LV fx shortening, PLAX chordal         43    %      >=29  LV PW thickness, ED                    12.5  mm     ----------  IVS/LV PW ratio, ED                    1.02         <=1.3  Stroke volume, 2D                      61    ml     ----------  Stroke volume/bsa, 2D                  35    ml/m^2 ----------  LV e&', lateral                         7.94  cm/s   ----------  LV E/e&', lateral                       10.14        ----------  LV e&', medial                          5.11  cm/s   ----------  LV E/e&', medial                        15.75         ----------  LV e&', average                         6.53  cm/s   ----------  LV E/e&', average                       12.34        ----------  Ventricular septum                     Value        Reference  IVS thickness, ED                      12.8  mm     ----------    LVOT                                   Value        Reference  LVOT ID, S                             19    mm     ----------  LVOT area                              2.84  cm^2   ----------  LVOT peak velocity, S                  116   cm/s   ----------  LVOT mean velocity, S                  75.2  cm/s   ----------  LVOT VTI, S                            21.4  cm     ----------  LVOT peak gradient, S                  5     mm Hg  ----------    Aorta                                  Value        Reference  Aortic root ID, ED                     33    mm     ----------    Left atrium                            Value        Reference  LA ID, A-P, ES                         30    mm     ----------  LA ID/bsa, A-P                         1.72  cm/m^2 <=2.2  LA volume, S                           42.3  ml     ----------  LA volume/bsa, S                       24.3  ml/m^2 ----------  LA volume, ES, 1-p A4C  32.7  ml     ----------  LA volume/bsa, ES, 1-p A4C             18.8  ml/m^2 ----------  LA volume, ES, 1-p A2C                 52.9  ml     ----------  LA volume/bsa, ES, 1-p A2C             30.3  ml/m^2 ----------    Mitral valve                           Value        Reference  Mitral E-wave peak velocity            80.5  cm/s   ----------  Mitral A-wave peak velocity            114   cm/s   ----------  Mitral deceleration time       (H)     282   ms     150 - 230  Mitral peak gradient, D                3     mm Hg  ----------  Mitral E/A ratio, peak                 0.7          ----------    Right atrium                           Value        Reference  RA ID, S-I, ES, A4C                     45.6  mm     34 - 49  RA area, ES, A4C                       12.8  cm^2   8.3 - 19.5  RA volume, ES, A/L                     29.7  ml     ----------  RA volume/bsa, ES, A/L                 17    ml/m^2 ----------    Systemic veins                         Value        Reference  Estimated CVP                          3     mm Hg  ----------    Right ventricle                        Value        Reference  TAPSE                                  22    mm     ----------  RV s&', lateral, S  11.7  cm/s   ----------  Legend: (L)  and  (H)  mark values outside specified reference range.  ------------------------------------------------------------------- Prepared and Electronically Authenticated by  Kirk Ruths 2018-05-24T15:56:43 MERGE Images   Show images for ECHOCARDIOGRAM COMPLETE Patient Information   Patient Name Doffing, Elliott Sex Female DOB [birth year] SSN [id number] Reason for Exam  Priority: Routine  Comments:  Surgical History   Surgical History    No past medical history on file.  Other Surgical History    Procedure Laterality Date Comment Source ABDOMINAL HYSTERECTOMY    Provider ENDARTERECTOMY Left 04/08/2017 Procedure: ENDARTERECTOMY CAROTID; Surgeon: Waynetta Sandy, MD; Location: [place]; Service: Vascular; Laterality: Left; Provider PATCH ANGIOPLASTY Left 04/08/2017 Procedure: PATCH ANGIOPLASTY Left Carotid; Surgeon: Waynetta Sandy, MD; Location: [place]; Service: Vascular; Laterality: Left; Provider  Patient Data   Height  68 in  BP  183/64 mmHg    Performing Technologist/Nurse   Performing Technologist/Nurse: Bobbye Charleston, RDCS          Implants    No active implants to display in this view. Order-Level Documents:   There are no order-level documents.  Encounter-Level Documents - 04/01/2017:   Scan on 05/21/2017 12:36 PM by Default, Provider, MDScan on 05/21/2017 12:36 PM by  Default, Provider, MD  Scan on 05/21/2017 12:03 PM by Default, Provider, MDScan on 05/21/2017 12:03 PM by Default, Provider, MD  Scan on 05/21/2017 12:01 PM by Default, Provider, MDScan on 05/21/2017 12:01 PM by Default, Provider, MD  Scan on 04/09/2017 12:16 PM by Default, Provider, MDScan on 04/09/2017 12:16 PM by Default, Provider, MD  Document on 04/09/2017 10:00 AM by Dolores Lory, RN: IP After Visit Summary  Document on 04/05/2017 2:06 PM by Tom-Johnson, Jimmey Ralph, RN: ED PB Summary  Document on 04/05/2017 2:06 PM by Tom-Johnson, Jimmey Ralph, RN: ED Encounter Summary  Scan on 04/09/2017 9:02 AM by Default, Provider, MDScan on 04/09/2017 9:02 AM by Default, Provider, MD  Scan on 04/09/2017 1:06 AM by Default, Provider, MDScan on 04/09/2017 1:06 AM by Default, Provider, MD  Electronic signature on 04/01/2017 9:53 AM - Signed  Scan on 04/01/2017 9:43 AM by Default, Provider, MDScan on 04/01/2017 9:43 AM by Default, Provider, MD    Signed   Electronically signed by Lelon Perla, MD on 04/03/17 at 90 EDT Printable Result Report    Result Report  External Result Report    External Result Report     Reproductive/Obstetrics                           Anesthesia Physical  Anesthesia Plan  ASA: II  Anesthesia Plan: General   Post-op Pain Management:    Induction: Intravenous  PONV Risk Score and Plan: 3 and Ondansetron  Airway Management Planned: LMA  Additional Equipment: Arterial line  Intra-op Plan:   Post-operative Plan: Extubation in OR  Informed Consent: I have reviewed the patients History and Physical, chart, labs and discussed the procedure including the risks, benefits and alternatives for the proposed anesthesia with the patient or authorized representative who has indicated his/her understanding and acceptance.   Dental advisory given  Plan Discussed with: CRNA  Anesthesia Plan Comments:         Anesthesia Quick Evaluation

## 2018-09-07 NOTE — Interval H&P Note (Signed)
History and Physical Interval Note:  09/07/2018 11:25 AM  [name] [name]  has presented today for surgery, with the diagnosis of RIGHT BREAST CANCER  The various methods of treatment have been discussed with the patient and family. After consideration of risks, benefits and other options for treatment, the patient has consented to  Procedure(s): INSERTION PORT-A-CATH (N/A) as a surgical intervention .  The patient's history has been reviewed, patient examined, no change in status, stable for surgery.  I have reviewed the patient's chart and labs.  Questions were answered to the patient's satisfaction.     Autumn Messing III

## 2018-09-08 ENCOUNTER — Encounter (HOSPITAL_COMMUNITY): Payer: Self-pay | Admitting: General Surgery

## 2018-09-08 ENCOUNTER — Ambulatory Visit: Payer: Medicare Other | Admitting: Oncology

## 2018-09-08 ENCOUNTER — Ambulatory Visit (HOSPITAL_COMMUNITY): Payer: Medicare Other

## 2018-09-08 ENCOUNTER — Encounter (HOSPITAL_COMMUNITY): Payer: Medicare Other

## 2018-09-08 ENCOUNTER — Other Ambulatory Visit (HOSPITAL_COMMUNITY): Payer: Medicare Other

## 2018-09-08 ENCOUNTER — Other Ambulatory Visit: Payer: Self-pay | Admitting: Oncology

## 2018-09-09 ENCOUNTER — Telehealth: Payer: Self-pay | Admitting: Oncology

## 2018-09-09 NOTE — Telephone Encounter (Signed)
Scheduled appt per 10/29 sch message - pt to get an updated schedule at chemo edu

## 2018-09-10 ENCOUNTER — Encounter (HOSPITAL_COMMUNITY)
Admission: RE | Admit: 2018-09-10 | Discharge: 2018-09-10 | Disposition: A | Discharge status: Home / Self Care | Payer: Medicare Other | Source: Ambulatory Visit | Attending: Oncology | Admitting: Oncology

## 2018-09-10 ENCOUNTER — Inpatient Hospital Stay: Payer: Medicare Other

## 2018-09-10 DIAGNOSIS — C50811 Malignant neoplasm of overlapping sites of right female breast: Secondary | ICD-10-CM | POA: Diagnosis not present

## 2018-09-10 DIAGNOSIS — C50911 Malignant neoplasm of unspecified site of right female breast: Secondary | ICD-10-CM | POA: Diagnosis not present

## 2018-09-10 DIAGNOSIS — Z170 Estrogen receptor positive status [ER+]: Secondary | ICD-10-CM | POA: Diagnosis not present

## 2018-09-10 MED ORDER — TECHNETIUM TC 99M MEDRONATE IV KIT
19.9000 | PACK | Freq: Once | INTRAVENOUS | Status: AC | PRN
  Administered 2018-09-10: 19.9 via INTRAVENOUS

## 2018-09-11 ENCOUNTER — Ambulatory Visit
Admission: RE | Admit: 2018-09-11 | Discharge: 2018-09-11 | Disposition: A | Discharge status: Home / Self Care | Payer: Medicare Other | Source: Ambulatory Visit | Attending: Oncology | Admitting: Oncology

## 2018-09-11 DIAGNOSIS — C50811 Malignant neoplasm of overlapping sites of right female breast: Secondary | ICD-10-CM

## 2018-09-11 DIAGNOSIS — Z170 Estrogen receptor positive status [ER+]: Principal | ICD-10-CM

## 2018-09-13 ENCOUNTER — Ambulatory Visit
Admission: RE | Admit: 2018-09-13 | Discharge: 2018-09-13 | Disposition: A | Discharge status: Home / Self Care | Payer: Medicare Other | Source: Ambulatory Visit | Attending: Oncology | Admitting: Oncology

## 2018-09-13 DIAGNOSIS — Z853 Personal history of malignant neoplasm of breast: Secondary | ICD-10-CM | POA: Diagnosis not present

## 2018-09-13 MED ORDER — GADOBUTROL 1 MMOL/ML IV SOLN
8.0000 mL | Freq: Once | INTRAVENOUS | Status: AC | PRN
  Administered 2018-09-13: 8 mL via INTRAVENOUS

## 2018-09-14 NOTE — Progress Notes (Signed)
[place]  Telephone:(336) [phone] Fax:(336) [phone]     ID: Mayli Covington DOB: 02-19-1946  MR#: [id number]  RXV#:[id number]  Patient Care Team: [name], MD as PCP - General (Internal Medicine) Waynetta Sandy, MD as Consulting Physician (Vascular Surgery) Patsey Berthold, NP as Nurse Practitioner (Cardiology) [name], MD as Consulting Physician (Physical Medicine and Rehabilitation) [name], [name], NP as Nurse Practitioner (Vascular Surgery) Edgardo Roys, PsyD as Consulting Physician (Psychology) Magrinat, [name], MD as Consulting Physician (Oncology) [name], MD as Consulting Physician (General Surgery) Eppie Gibson, MD as Attending Physician (Radiation Oncology) OTHER MD:  CHIEF COMPLAINT: Inflammatory breast cancer  CURRENT TREATMENT: Neoadjuvant chemotherapy   HISTORY OF CURRENT ILLNESS: Webb Laws presented with right breast discoloration and redness. The right breast also had an area of hardness and nipple retraction.  She did not bring this to medical attention but her daughter did mention it when the patient had neurologic follow-up on 08/10/2018.  At that time the nurse practitioner, [name], noted right nipple inversion, hard breast, and red non-ulcerated lesions on the breast.  The patient then underwent bilateral diagnostic mammography with tomography and right breast ultrasonography at [facility] on 08/11/2018 (this is her first ever mammogram) showing: breast density category B. There was a round 2.5 cm mass at the 1 o'clock upper inner quadrant of the right breast.  By palpation this measured approximately 10 cm.  Sonographically the irregular hypoechoic mass measured 2.7 cm. There was also a hypoechoic mass in the right breast at 9 o'clock upper outer quadrant measuring 3.8 cm. There was an abnormal right axillary lymph node with cortical thickening.   Accordingly on 08/19/2018 she proceeded to biopsy of  the right breast areas in question. The pathology from this procedure showed [id number]): At the 1 o'clock: invasive lobular carcinoma, grade II. Prognostic indicators significant for: estrogen receptor, 60% positive with moderate staining intensity and progesterone receptor, 0% negative. Proliferation marker Ki67 at 20%. HER2 negative with an immunohistochemistry of (1+).  At the 9 o'clock: invasive lobular carcinoma, grade II. Prognostic indicators significant for: estrogen receptor, 90% positive with strong staining intensity and progesterone receptor, 0% negative. Proliferation marker Ki67 at 30%. HER2 negative with an immunohistochemistry of (1+).  The patient's subsequent history is as detailed below.  INTERVAL HISTORY: [name] [name] returns today for further evaluation and treatment of her locally advanced breast cancer, accompanied by her daughter.  Since our last visit, she had a CT of the chest abdomen and pelvis that revealed abnormal asymmetric density throughout the right breast extending to the cutaneous surface and the superficial fascia margin of the pectoralis major, compatible with the patient's diagnosis of inflammatory breast cancer. [place] right axillary and subpectoral lymph nodes are not pathologically enlarged and no compelling findings of distant metastatic spread are identified currently. 2. 5 mm ground-glass density nodule in the left upper lobe and separate 4 mm sub solid left upper lobe nodule. The sub solid appearance would be unusual for metastatic lesion but low-grade adenocarcinoma is not completely excluded. No follow-up needed if patient is low-risk (and has no known or suspected primary neoplasm). I would suggest follow up chest CT within the next 12 months. There also was a single bilateral nonobstructive renal calculi. Aortic Atherosclerosis with notable coronary atherosclerosis was seen. Lumbar spondylosis and degenerative disc disease. Hazy appearance of the central  mesentery. 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  Bone  scan on 09/10/2018 revealed no bony metastasis.  Breast MRI on 09/13/2018 showed right breast malignancy involving a large portion of the right breast, predominantly the entire upper right breast and extending inferiorly to involve the central to lower outer quadrant, and in entirety measuring at least 12.6 x 12.4 x 11 cm (although difficult to place a true measurement on this large mass). The mass extends to involve the skin of the anterior right breast and extends to and involves the pectoralis muscle. No definite right axillary lymphadenopathy. No MRI evidence of malignancy in the left breast. Breast composition: C.  She is scheduled to start her chemotherapy tomorrow and is here today to discuss the above studies and how to take her supportive medications   REVIEW OF SYSTEMS: Shayne did fine with all the tests but is very tired.  She says during the day she does "nothing".  She is not very active, but does fold some clothes and occasionally does a little bit of housework.  She had a mild epistaxis after 1 of the procedures.  Of course she is on Plavix.  However she has not had prior epistaxis episodes.  She denies unusual headaches, visual changes, nausea, vomiting, cough, phlegm production, pleurisy, or change in bowel or bladder habits.  A detailed review of systems today was otherwise stable  PAST MEDICAL HISTORY: Past Medical History:  Diagnosis Date  . Anxiety   . Cancer [facility])    breast cancer- right  . Carotid stenosis   . Depression    situational  . Dyspnea    with much ambulation  . GERD (gastroesophageal reflux disease)   . Hypertension   . Stroke ([place]) 04/01/2017   a little weakness right side leg and hand, a little memry loss    PAST SURGICAL HISTORY: Past Surgical History:  Procedure Laterality Date  . ABDOMINAL HYSTERECTOMY     . ENDARTERECTOMY Left 04/08/2017   Procedure: ENDARTERECTOMY CAROTID;  Surgeon: Waynetta Sandy, MD;  Location: [place];  Service: Vascular;  Laterality: Left;  . PATCH ANGIOPLASTY Left 04/08/2017   Procedure: PATCH ANGIOPLASTY Left Carotid;  Surgeon: Waynetta Sandy, MD;  Location: [place];  Service: Vascular;  Laterality: Left;  . PORTACATH PLACEMENT Left 09/07/2018   Procedure: INSERTION PORT-A-CATH LEFT INTERNAL JUGULAR;  Surgeon: [name], MD;  Location: [place];  Service: General;  Laterality: Left;  total hysterectomy with bilateral salpingo-oophorectomy in her 89's no hormone replacement  FAMILY HISTORY Family History  Problem Relation Age of Onset  . Cancer Mother        ovarian  The patient's father is alive at age 53. The patient's mother died at age 35 due to ovarian cancer. The patient had 3 brothers and 3 sisters. She denies a history of breast, pancreatic, colon, or other cancers in the family.   GYNECOLOGIC HISTORY:  No LMP recorded. Patient has had a hysterectomy. Menarche: Age at first live birth: 72 years old She is GX P1.  She is status post total hysterectomy with BSO in her early 65's. She did not use HRT.    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. She is divorced. At home is just herself, but her two grandsons regularly come to visit and stay with her. The patient's daughter, [name] works in administration for [employer]. The patient's 60 y.o. grandson is a Freight forwarder for [employer]. The patient's 77 y.o. grandson works in [employer].     ADVANCED DIRECTIVES:  Not in place.  At the 08/26/2018 visit the patient was given the appropriate documents to complete and notarized at her discretion.   HEALTH MAINTENANCE: Social History   Tobacco Use  . Smoking status: Never Smoker  . Smokeless tobacco: Never Used  Substance Use Topics  . Alcohol use: No  . Drug use: No     Colonoscopy: Never  PAP: Status  post hysterectomy  Bone density: Never   No Known Allergies  Current Outpatient Medications  Medication Sig Dispense Refill  . amLODipine (NORVASC) 10 MG tablet Take 1 tablet (10 mg total) by mouth daily. PATIENT MUST HAVE OFFICE VISIT AND LABS PRIOR TO ANY FURTHER REFILLS 15 tablet 0  . atorvastatin (LIPITOR) 80 MG tablet Take 1 tablet (80 mg total) by mouth daily at 6 PM. 30 tablet 0  . clopidogrel (PLAVIX) 75 MG tablet TAKE 1 TABLET BY MOUTH EVERY DAY 30 tablet 1  . lidocaine-prilocaine (EMLA) cream Apply to affected area once (Patient taking differently: Apply 1 application topically daily as needed (port access). Apply to affected area once) 30 g 3  . lisinopril-hydrochlorothiazide (PRINZIDE,ZESTORETIC) 20-12.5 MG tablet Take 1 tablet by mouth daily.  11  . loratadine (CLARITIN) 10 MG tablet Take 10 mg by mouth daily as needed for allergies.    . pantoprazole (PROTONIX) 40 MG tablet TAKE 1 TABLET BY MOUTH EVERY DAY 30 tablet 1  . prochlorperazine (COMPAZINE) 10 MG tablet Take 1 tablet (10 mg total) by mouth every 6 (six) hours as needed (Nausea or vomiting). 30 tablet 1  . traZODone (DESYREL) 50 MG tablet Take 0.5-1 tablets (25-50 mg total) by mouth at bedtime as needed for sleep. (Patient taking differently: Take 50 mg by mouth at bedtime as needed for sleep. ) 15 tablet 0   No current facility-administered medications for this visit.     OBJECTIVE: Older African-American woman no acute distress  Vitals:   09/15/18 1617  BP: (!) 157/65  Pulse: (!) 102  Resp: 18  Temp: 99.1 F (37.3 C)  SpO2: 99%     Body mass index is 27.5 kg/m.   Wt Readings from Last 3 Encounters:  09/15/18 170 lb 6.4 oz (77.3 kg)  09/07/18 170 lb (77.1 kg)  08/26/18 170 lb 11.2 oz (77.4 kg)      ECOG FS:2 - Symptomatic, <50% confined to bed  Sclerae unicteric, EOMs intact No cervical or supraclavicular adenopathy Lungs no rales or rhonchi Heart regular rate and rhythm Abd soft, nontender, positive  bowel sounds MSK no focal spinal tenderness, no upper extremity lymphedema Neuro: nonfocal, well oriented, passive affect Breasts: The right breast is imaged below.  The entire breast is very firm, although it appears movable.  There is some erythema superiorly.  The left breast is benign.  Both axilla appear benign.   Right Breast 08/26/2018        LAB RESULTS:  CMP     Component Value Date/Time   NA 142 09/15/2018 1607   K 3.8 09/15/2018 1607   CL 107 09/15/2018 1607   CO2 28 09/15/2018 1607   GLUCOSE 112 (H) 09/15/2018 1607   BUN 19 09/15/2018 1607   CREATININE 0.91 09/15/2018 1607   CREATININE 1.08 (H) 08/26/2018 1235   CALCIUM 9.4 09/15/2018 1607   PROT 7.4 09/15/2018 1607   ALBUMIN 3.7 09/15/2018 1607   AST 13 (L) 09/15/2018 1607   AST 15 08/26/2018 1235   ALT 18 09/15/2018 1607   ALT 20 08/26/2018 1235   ALKPHOS  94 09/15/2018 1607   BILITOT 0.3 09/15/2018 1607   BILITOT 0.4 08/26/2018 1235   GFRNONAA >60 09/15/2018 1607   GFRNONAA 51 (L) 08/26/2018 1235   GFRAA >60 09/15/2018 1607   GFRAA 59 (L) 08/26/2018 1235    No results found for: TOTALPROTELP, ALBUMINELP, A1GS, A2GS, BETS, BETA2SER, GAMS, MSPIKE, SPEI  No results found for: KPAFRELGTCHN, LAMBDASER, KAPLAMBRATIO  Lab Results  Component Value Date   WBC 6.6 09/15/2018   NEUTROABS 3.3 09/15/2018   HGB 11.4 (L) 09/15/2018   HCT 36.7 09/15/2018   MCV 81.9 09/15/2018   PLT 190 09/15/2018    '@LASTCHEMISTRY'$ @  No results found for: LABCA2  No components found for: VHQION629  No results for input(s): INR in the last 168 hours.  No results found for: LABCA2  No results found for: BMW413  No results found for: KGM010  No results found for: UVO536  No results found for: CA2729  No components found for: HGQUANT  No results found for: CEA1 / No results found for: CEA1   No results found for: AFPTUMOR  No results found for: [place]  No results found for: PSA1  Appointment on  09/15/2018  Component Date Value Ref Range Status  . Sodium 09/15/2018 142  135 - 145 mmol/L Final  . Potassium 09/15/2018 3.8  3.5 - 5.1 mmol/L Final  . Chloride 09/15/2018 107  98 - 111 mmol/L Final  . CO2 09/15/2018 28  22 - 32 mmol/L Final  . Glucose, Bld 09/15/2018 112* 70 - 99 mg/dL Final  . BUN 09/15/2018 19  8 - 23 mg/dL Final  . Creatinine, Ser 09/15/2018 0.91  0.44 - 1.00 mg/dL Final  . Calcium 09/15/2018 9.4  8.9 - 10.3 mg/dL Final  . Total Protein 09/15/2018 7.4  6.5 - 8.1 g/dL Final  . Albumin 09/15/2018 3.7  3.5 - 5.0 g/dL Final  . AST 09/15/2018 13* 15 - 41 U/L Final  . ALT 09/15/2018 18  0 - 44 U/L Final  . Alkaline Phosphatase 09/15/2018 94  38 - 126 U/L Final  . Total Bilirubin 09/15/2018 0.3  0.3 - 1.2 mg/dL Final  . GFR calc non Af Amer 09/15/2018 >60  >60 mL/min Final  . GFR calc Af Amer 09/15/2018 >60  >60 mL/min Final   Comment: (NOTE) The eGFR has been calculated using the CKD EPI equation. This calculation has not been validated in all clinical situations. eGFR's persistently <60 mL/min signify possible Chronic Kidney Disease.   Georgiann Hahn gap 09/15/2018 7  5 - 15 Final   Performed at [facility], [place] [address], [place], [place] [address]  . WBC 09/15/2018 6.6  4.0 - 10.5 K/uL Final  . RBC 09/15/2018 4.48  3.87 - 5.11 MIL/uL Final  . Hemoglobin 09/15/2018 11.4* 12.0 - 15.0 g/dL Final  . HCT 09/15/2018 36.7  36.0 - 46.0 % Final  . MCV 09/15/2018 81.9  80.0 - 100.0 fL Final  . MCH 09/15/2018 25.4* 26.0 - 34.0 pg Final  . MCHC 09/15/2018 31.1  30.0 - 36.0 g/dL Final  . RDW 09/15/2018 14.1  11.5 - 15.5 % Final  . Platelets 09/15/2018 190  150 - 400 K/uL Final  . nRBC 09/15/2018 0.0  0.0 - 0.2 % Final  . Neutrophils Relative % 09/15/2018 49  % Final  . Neutro Abs 09/15/2018 3.3  1.7 - 7.7 K/uL Final  . Lymphocytes Relative 09/15/2018 33  % Final  . Lymphs Abs 09/15/2018 2.2  0.7 -  4.0 K/uL Final  . Monocytes Relative 09/15/2018 12  %  Final  . Monocytes Absolute 09/15/2018 0.8  0.1 - 1.0 K/uL Final  . Eosinophils Relative 09/15/2018 4  % Final  . Eosinophils Absolute 09/15/2018 0.3  0.0 - 0.5 K/uL Final  . Basophils Relative 09/15/2018 1  % Final  . Basophils Absolute 09/15/2018 0.1  0.0 - 0.1 K/uL Final  . Immature Granulocytes 09/15/2018 1  % Final  . Abs Immature Granulocytes 09/15/2018 0.05  0.00 - 0.07 K/uL Final   Performed at [facility], [place] [address], [place], [place] [address]    (this displays the last labs from the last 3 days)  No results found for: TOTALPROTELP, ALBUMINELP, A1GS, A2GS, BETS, BETA2SER, GAMS, MSPIKE, SPEI (this displays SPEP labs)  No results found for: KPAFRELGTCHN, LAMBDASER, KAPLAMBRATIO (kappa/lambda light chains)  No results found for: HGBA, HGBA2QUANT, HGBFQUANT, HGBSQUAN (Hemoglobinopathy evaluation)   No results found for: LDH  No results found for: IRON, TIBC, IRONPCTSAT (Iron and TIBC)  No results found for: FERRITIN  Urinalysis    Component Value Date/Time   COLORURINE AMBER (A) 04/01/2017 0920   APPEARANCEUR CLOUDY (A) 04/01/2017 0920   LABSPEC 1.021 04/01/2017 0920   PHURINE 5.0 04/01/2017 0920   GLUCOSEU NEGATIVE 04/01/2017 0920   HGBUR MODERATE (A) 04/01/2017 0920   BILIRUBINUR NEGATIVE 04/01/2017 0920   KETONESUR 80 (A) 04/01/2017 0920   PROTEINUR NEGATIVE 04/01/2017 0920   NITRITE NEGATIVE 04/01/2017 0920   LEUKOCYTESUR LARGE (A) 04/01/2017 0920     STUDIES: Ct Chest W Contrast  Result Date: 09/02/2018 CLINICAL DATA:  Right breast cancer staging workup EXAM: CT CHEST, ABDOMEN, AND PELVIS WITH CONTRAST TECHNIQUE: Multidetector CT imaging of the chest, abdomen and pelvis was performed following the standard protocol during bolus administration of intravenous contrast. CONTRAST:  162m OMNIPAQUE IOHEXOL 300 MG/ML  SOLN COMPARISON:  Brachial plexus MRI from 04/01/2017 FINDINGS: CT CHEST FINDINGS Cardiovascular: Atherosclerotic  calcification of the aortic arch, left anterior descending coronary artery, right coronary artery, and circumflex coronary artery. Mediastinum/Nodes: Mostly Ng right axillary and subpectoral lymph nodes including a 7 mm right axillary node on image 23/2. Lungs/Pleura: Biapical pleuroparenchymal scarring. Cylindrical bronchiectasis in both lower lobes. 5 mm ground-glass density nodule in the left upper lobe, image 40/5. 0.5 by 0.3 cm sub solid left upper lobe nodule, image 61/5. Scarring or atelectasis in the left lower lobe near the hemidiaphragm. Musculoskeletal: Mild thoracic kyphosis and spondylosis. I do not observe compelling evidence of active osseous metastatic disease. There is abnormal asymmetric density and enhancement throughout the glandular tissues of the right breast but especially in the upper breast extending to the cutaneous surface, and also extending to the superficial fascial surface of the pectoralis major muscle, suspicious for inflammatory breast cancer. CT ABDOMEN PELVIS FINDINGS Hepatobiliary: Unremarkable Pancreas: Unremarkable Spleen: Unremarkable Adrenals/Urinary Tract: 3 mm right kidney lower pole nonobstructive renal calculus. 4 mm left kidney lower pole nonobstructive renal calculus. No hydronephrosis or hydroureter. Adrenal glands normal. Stomach/Bowel: Unremarkable Vascular/Lymphatic: Aortoiliac atherosclerotic vascular disease. Reproductive: Uterus surgically absent.  Adnexa unremarkable. Other: Indistinct hazy appearance of the central mesentery. No ascites. Musculoskeletal: Grade 1 degenerative anterolisthesis at L4-5. Lumbar degenerative disc disease. Degenerative facet arthropathy in the lower lumbar spine. IMPRESSION: 1. Abnormal asymmetric density throughout the right breast extending to the cutaneous surface and the superficial fascia margin of the pectoralis major, compatible with the patient's diagnosis of inflammatory breast cancer. Meth right axillary and subpectoral  lymph nodes are not pathologically  enlarged and no compelling findings of distant metastatic spread are identified currently. 2. 5 mm ground-glass density nodule in the left upper lobe and separate 4 mm sub solid left upper lobe nodule. The sub solid appearance would be unusual for metastatic lesion but low-grade adenocarcinoma is not completely excluded. No follow-up needed if patient is low-risk (and has no known or suspected primary neoplasm). I would suggest follow up chest CT within the next 12 months. 3. Single bilateral nonobstructive renal calculi. 4. Aortic Atherosclerosis (ICD10-I70.0). Notable coronary atherosclerosis. 5. Lumbar spondylosis and degenerative disc disease. 6. Hazy appearance of the central mesentery. 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 Electronically Signed   By: Van Clines M.D.   On: 09/02/2018 12:03   Nm Bone Scan Whole Body  Result Date: 09/10/2018 CLINICAL DATA:  Right breast cancer EXAM: NUCLEAR MEDICINE WHOLE BODY BONE SCAN TECHNIQUE: Whole body anterior and posterior images were obtained approximately 3 hours after intravenous injection of radiopharmaceutical. RADIOPHARMACEUTICALS:  19.9 mCi Technetium-66mMDP IV COMPARISON:  CT scan September 01, 2018 FINDINGS: Degenerative changes in the knees, left first MTP joint, and spine. No other significant bony abnormalities. Increased soft tissue uptake in the right breast, consistent with known malignancy. No other acute abnormalities identified. IMPRESSION: No bony metastatic disease noted. Electronically Signed   By: DDorise BullionIII M.D   On: 09/10/2018 15:32   Ct Abdomen Pelvis W Contrast  Result Date: 09/02/2018 CLINICAL DATA:  Right breast cancer staging workup EXAM: CT CHEST, ABDOMEN, AND PELVIS WITH CONTRAST TECHNIQUE: Multidetector CT imaging of the chest, abdomen and pelvis was performed following the standard protocol  during bolus administration of intravenous contrast. CONTRAST:  10106mOMNIPAQUE IOHEXOL 300 MG/ML  SOLN COMPARISON:  Brachial plexus MRI from 04/01/2017 FINDINGS: CT CHEST FINDINGS Cardiovascular: Atherosclerotic calcification of the aortic arch, left anterior descending coronary artery, right coronary artery, and circumflex coronary artery. Mediastinum/Nodes: Mostly Goffredo right axillary and subpectoral lymph nodes including a 7 mm right axillary node on image 23/2. Lungs/Pleura: Biapical pleuroparenchymal scarring. Cylindrical bronchiectasis in both lower lobes. 5 mm ground-glass density nodule in the left upper lobe, image 40/5. 0.5 by 0.3 cm sub solid left upper lobe nodule, image 61/5. Scarring or atelectasis in the left lower lobe near the hemidiaphragm. Musculoskeletal: Mild thoracic kyphosis and spondylosis. I do not observe compelling evidence of active osseous metastatic disease. There is abnormal asymmetric density and enhancement throughout the glandular tissues of the right breast but especially in the upper breast extending to the cutaneous surface, and also extending to the superficial fascial surface of the pectoralis major muscle, suspicious for inflammatory breast cancer. CT ABDOMEN PELVIS FINDINGS Hepatobiliary: Unremarkable Pancreas: Unremarkable Spleen: Unremarkable Adrenals/Urinary Tract: 3 mm right kidney lower pole nonobstructive renal calculus. 4 mm left kidney lower pole nonobstructive renal calculus. No hydronephrosis or hydroureter. Adrenal glands normal. Stomach/Bowel: Unremarkable Vascular/Lymphatic: Aortoiliac atherosclerotic vascular disease. Reproductive: Uterus surgically absent.  Adnexa unremarkable. Other: Indistinct hazy appearance of the central mesentery. No ascites. Musculoskeletal: Grade 1 degenerative anterolisthesis at L4-5. Lumbar degenerative disc disease. Degenerative facet arthropathy in the lower lumbar spine. IMPRESSION: 1. Abnormal asymmetric density throughout the  right breast extending to the cutaneous surface and the superficial fascia margin of the pectoralis major, compatible with the patient's diagnosis of inflammatory breast cancer. [place] right axillary and subpectoral lymph nodes are not pathologically enlarged and no compelling findings of distant metastatic spread are identified currently. 2. 5 mm ground-glass density  nodule in the left upper lobe and separate 4 mm sub solid left upper lobe nodule. The sub solid appearance would be unusual for metastatic lesion but low-grade adenocarcinoma is not completely excluded. No follow-up needed if patient is low-risk (and has no known or suspected primary neoplasm). I would suggest follow up chest CT within the next 12 months. 3. Single bilateral nonobstructive renal calculi. 4. Aortic Atherosclerosis (ICD10-I70.0). Notable coronary atherosclerosis. 5. Lumbar spondylosis and degenerative disc disease. 6. Hazy appearance of the central mesentery. 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 Electronically Signed   By: Van Clines M.D.   On: 09/02/2018 12:03   Mr Breast Bilateral W Wo Contrast Inc Cad  Result Date: 09/14/2018 CLINICAL DATA:  72 year old female with recently diagnosed inflammatory right breast cancer post ultrasound-guided biopsy masses at the 1 o'clock and 9 o'clock positions. Possible abnormal lymph node was identified however due to deep location was not biopsied. LABS:  Not applicable. EXAM: BILATERAL BREAST MRI WITH AND WITHOUT CONTRAST TECHNIQUE: Multiplanar, multisequence MR images of both breasts were obtained prior to and following the intravenous administration of 8 ml of Gadavist Three-dimensional MR images were rendered by post-processing of the original MR data on an independent workstation. The three-dimensional MR images were interpreted, and findings are reported in the following complete MRI report  for this study. Three dimensional images were evaluated at the independent DynaCad workstation COMPARISON:  Previous exam(s). FINDINGS: Breast composition: c.  Heterogeneous fibroglandular tissue. Background parenchymal enhancement: Moderate. Right breast: There is right breast and nipple retraction with associated skin and trabecular thickening. Biopsy clip artifact is present in the upper inner and periareolar/slightly outer right breast at sites of biopsy proven malignancy. There is abnormal masslike enhancement of nearly the entire upper right breast extending to the skin and extending inferiorly to involve the central to lower outer quadrant, in entirety measuring at least 12.6 x 12.4 x 11 cm. This mass appears to infiltrate into the pectoralis muscle of the upper inner right breast (subtraction image 48). Left breast: No mass or abnormal enhancement. Lymph nodes: No definite morphologically abnormal lymph nodes. Ancillary findings:  None. IMPRESSION: 1. Right breast malignancy involving a large portion of the right breast, predominantly the entire upper right breast and extending inferiorly to involve the central to lower outer quadrant, and in entirety measuring at least 12.6 x 12.4 x 11 cm (although difficult to place a true measurement on this large mass). The mass extends to involve the skin of the anterior right breast and extends to and involves the pectoralis muscle. 2.  No definite right axillary lymphadenopathy. 3.  No MRI evidence of malignancy in the left breast. RECOMMENDATION: Treatment plan for known right breast malignancy. BI-RADS CATEGORY  6: Known biopsy-proven malignancy. Electronically Signed   By: Everlean Alstrom M.D.   On: 09/14/2018 15:42   Dg Chest Port 1 View  Result Date: 09/07/2018 CLINICAL DATA:  Port-A-Cath placement.  Breast cancer EXAM: PORTABLE CHEST 1 VIEW COMPARISON:  CT chest 09/01/2018 FINDINGS: Left jugular Port-A-Cath tip in the mid SVC.  No pneumothorax Heart size  upper normal. Mild atelectasis in the bases. Negative for infiltrate or effusion. IMPRESSION: Satisfactory Port-A-Cath placement. Electronically Signed   By: Franchot Gallo M.D.   On: 09/07/2018 14:09   Dg Fluoro Guide Cv Line-no Report  Result Date: 09/07/2018 Fluoroscopy was utilized by the requesting physician.  No radiographic interpretation.     ELIGIBLE FOR  AVAILABLE RESEARCH PROTOCOL: no  ASSESSMENT: 72 y.o. [place], [place] woman status post right breast biopsy of overlapping sites on 08/19/2018 for a clinical T4 N1, stage IIIB invasive lobular carcinoma, estrogen receptor positive, progesterone receptor negative, HER-2 not amplified, with an MIB-1 of 20%.  (1) genetics testing pending  (2) neoadjuvant chemotherapy to consist of cyclophosphamide, methotrexate and fluorouracil every 21 days x 8, to begin 09/16/2018  (3) definitive surgery to follow  (4) adjuvant radiation  (5) antiestrogens therapy at the completion of local treatment  (6) foundation 1/PD-L1 testing  PLAN: I spent approximately 30 minutes face to face with Yolette and her daughter with more than 50% of that time  scan.  We reviewed her staging studies and I uploaded her breast MRI images so the daughter could take a picture and so the patient could understand why she is being treated with chemotherapy before surgery.  We again reviewed the possible toxicity side effects and complications of her chemotherapy.  She is going to take Compazine 3 times daily with breakfast lunch and supper on days 2 and 3 and then as needed.  I am going to set her up for some fluids on Monday in case she becomes dehydrated and a visit on Tuesday or Wednesday of next week simply because concerns me she is so passive she might not call us with any problems until she is in serious trouble.  At the next visit we will schedule the next series of treatments and visits, but if she tolerates the first cycle very poorly we may switch to  antiestrogens with CDK4,6 inhibitors assuming we can obtain them for her  I urged him to call us with any problems that may develop before the next visit.   Magrinat, [name], MD  09/15/18 7:11 PM Medical Oncology and Hematology [facility] [address] [place], [place] [address] Tel. [phone]    Fax. [phone]  Dierdre Searles Dweik am acting as scribe for Dr. [name] Magrinat.  I, Lurline Del MD, have reviewed the above documentation for accuracy and completeness, and I agree with the above.

## 2018-09-15 ENCOUNTER — Inpatient Hospital Stay: Payer: Medicare Other

## 2018-09-15 ENCOUNTER — Inpatient Hospital Stay: Payer: Medicare Other | Attending: Oncology | Admitting: Oncology

## 2018-09-15 ENCOUNTER — Encounter (HOSPITAL_COMMUNITY): Payer: Self-pay | Admitting: General Surgery

## 2018-09-15 VITALS — BP 157/65 | HR 102 | Temp 99.1°F | Resp 18 | Ht 66.0 in | Wt 170.4 lb

## 2018-09-15 DIAGNOSIS — I63132 Cerebral infarction due to embolism of left carotid artery: Secondary | ICD-10-CM

## 2018-09-15 DIAGNOSIS — C50811 Malignant neoplasm of overlapping sites of right female breast: Secondary | ICD-10-CM | POA: Diagnosis not present

## 2018-09-15 DIAGNOSIS — Z5111 Encounter for antineoplastic chemotherapy: Secondary | ICD-10-CM | POA: Diagnosis not present

## 2018-09-15 DIAGNOSIS — Z8673 Personal history of transient ischemic attack (TIA), and cerebral infarction without residual deficits: Secondary | ICD-10-CM | POA: Diagnosis not present

## 2018-09-15 DIAGNOSIS — R911 Solitary pulmonary nodule: Secondary | ICD-10-CM

## 2018-09-15 DIAGNOSIS — I69359 Hemiplegia and hemiparesis following cerebral infarction affecting unspecified side: Secondary | ICD-10-CM

## 2018-09-15 DIAGNOSIS — Z79899 Other long term (current) drug therapy: Secondary | ICD-10-CM | POA: Insufficient documentation

## 2018-09-15 DIAGNOSIS — C50911 Malignant neoplasm of unspecified site of right female breast: Secondary | ICD-10-CM

## 2018-09-15 DIAGNOSIS — Z170 Estrogen receptor positive status [ER+]: Principal | ICD-10-CM

## 2018-09-15 LAB — COMPREHENSIVE METABOLIC PANEL
ALT: 18 U/L (ref 0–44)
AST: 13 U/L — ABNORMAL LOW (ref 15–41)
Albumin: 3.7 g/dL (ref 3.5–5.0)
Alkaline Phosphatase: 94 U/L (ref 38–126)
Anion gap: 7 (ref 5–15)
BUN: 19 mg/dL (ref 8–23)
CALCIUM: 9.4 mg/dL (ref 8.9–10.3)
CHLORIDE: 107 mmol/L (ref 98–111)
CO2: 28 mmol/L (ref 22–32)
CREATININE: 0.91 mg/dL (ref 0.44–1.00)
Glucose, Bld: 112 mg/dL — ABNORMAL HIGH (ref 70–99)
Potassium: 3.8 mmol/L (ref 3.5–5.1)
Sodium: 142 mmol/L (ref 135–145)
Total Bilirubin: 0.3 mg/dL (ref 0.3–1.2)
Total Protein: 7.4 g/dL (ref 6.5–8.1)

## 2018-09-15 LAB — CBC WITH DIFFERENTIAL/PLATELET
Abs Immature Granulocytes: 0.05 10*3/uL (ref 0.00–0.07)
Basophils Absolute: 0.1 10*3/uL (ref 0.0–0.1)
Basophils Relative: 1 %
EOS ABS: 0.3 10*3/uL (ref 0.0–0.5)
EOS PCT: 4 %
HCT: 36.7 % (ref 36.0–46.0)
Hemoglobin: 11.4 g/dL — ABNORMAL LOW (ref 12.0–15.0)
Immature Granulocytes: 1 %
Lymphocytes Relative: 33 %
Lymphs Abs: 2.2 10*3/uL (ref 0.7–4.0)
MCH: 25.4 pg — ABNORMAL LOW (ref 26.0–34.0)
MCHC: 31.1 g/dL (ref 30.0–36.0)
MCV: 81.9 fL (ref 80.0–100.0)
Monocytes Absolute: 0.8 10*3/uL (ref 0.1–1.0)
Monocytes Relative: 12 %
NRBC: 0 % (ref 0.0–0.2)
Neutro Abs: 3.3 10*3/uL (ref 1.7–7.7)
Neutrophils Relative %: 49 %
PLATELETS: 190 10*3/uL (ref 150–400)
RBC: 4.48 MIL/uL (ref 3.87–5.11)
RDW: 14.1 % (ref 11.5–15.5)
WBC: 6.6 10*3/uL (ref 4.0–10.5)

## 2018-09-16 ENCOUNTER — Telehealth: Payer: Self-pay | Admitting: Oncology

## 2018-09-16 ENCOUNTER — Inpatient Hospital Stay: Payer: Medicare Other

## 2018-09-16 ENCOUNTER — Encounter: Payer: Self-pay | Admitting: *Deleted

## 2018-09-16 VITALS — BP 171/60 | HR 100 | Temp 98.6°F | Resp 19

## 2018-09-16 DIAGNOSIS — Z5111 Encounter for antineoplastic chemotherapy: Secondary | ICD-10-CM | POA: Diagnosis not present

## 2018-09-16 DIAGNOSIS — Z170 Estrogen receptor positive status [ER+]: Secondary | ICD-10-CM

## 2018-09-16 DIAGNOSIS — R911 Solitary pulmonary nodule: Secondary | ICD-10-CM | POA: Diagnosis not present

## 2018-09-16 DIAGNOSIS — C50811 Malignant neoplasm of overlapping sites of right female breast: Secondary | ICD-10-CM | POA: Diagnosis not present

## 2018-09-16 DIAGNOSIS — Z79899 Other long term (current) drug therapy: Secondary | ICD-10-CM | POA: Diagnosis not present

## 2018-09-16 DIAGNOSIS — C50911 Malignant neoplasm of unspecified site of right female breast: Secondary | ICD-10-CM

## 2018-09-16 DIAGNOSIS — Z8673 Personal history of transient ischemic attack (TIA), and cerebral infarction without residual deficits: Secondary | ICD-10-CM | POA: Diagnosis not present

## 2018-09-16 LAB — CANCER ANTIGEN 27.29: CA 27.29: 54.4 U/mL — ABNORMAL HIGH (ref 0.0–38.6)

## 2018-09-16 MED ORDER — PALONOSETRON HCL INJECTION 0.25 MG/5ML
0.2500 mg | Freq: Once | INTRAVENOUS | Status: AC
  Administered 2018-09-16: 0.25 mg via INTRAVENOUS

## 2018-09-16 MED ORDER — FLUOROURACIL CHEMO INJECTION 2.5 GM/50ML
600.0000 mg/m2 | Freq: Once | INTRAVENOUS | Status: AC
  Administered 2018-09-16: 1150 mg via INTRAVENOUS
  Filled 2018-09-16: qty 23

## 2018-09-16 MED ORDER — METHOTREXATE SODIUM (PF) CHEMO INJECTION 250 MG/10ML
39.8000 mg/m2 | Freq: Once | INTRAMUSCULAR | Status: AC
  Administered 2018-09-16: 76 mg via INTRAVENOUS
  Filled 2018-09-16: qty 3.04

## 2018-09-16 MED ORDER — DEXAMETHASONE SODIUM PHOSPHATE 10 MG/ML IJ SOLN
INTRAMUSCULAR | Status: AC
  Filled 2018-09-16: qty 1

## 2018-09-16 MED ORDER — DEXAMETHASONE SODIUM PHOSPHATE 10 MG/ML IJ SOLN
10.0000 mg | Freq: Once | INTRAMUSCULAR | Status: AC
  Administered 2018-09-16: 10 mg via INTRAVENOUS

## 2018-09-16 MED ORDER — HEPARIN SOD (PORK) LOCK FLUSH 100 UNIT/ML IV SOLN
500.0000 [IU] | Freq: Once | INTRAVENOUS | Status: AC | PRN
  Administered 2018-09-16: 500 [IU]
  Filled 2018-09-16: qty 5

## 2018-09-16 MED ORDER — SODIUM CHLORIDE 0.9 % IV SOLN
Freq: Once | INTRAVENOUS | Status: AC
  Administered 2018-09-16: 14:00:00 via INTRAVENOUS
  Filled 2018-09-16: qty 250

## 2018-09-16 MED ORDER — PALONOSETRON HCL INJECTION 0.25 MG/5ML
INTRAVENOUS | Status: AC
  Filled 2018-09-16: qty 5

## 2018-09-16 MED ORDER — SODIUM CHLORIDE 0.9% FLUSH
10.0000 mL | INTRAVENOUS | Status: DC | PRN
  Administered 2018-09-16: 10 mL
  Filled 2018-09-16: qty 10

## 2018-09-16 MED ORDER — SODIUM CHLORIDE 0.9 % IV SOLN
600.0000 mg/m2 | Freq: Once | INTRAVENOUS | Status: AC
  Administered 2018-09-16: 1140 mg via INTRAVENOUS
  Filled 2018-09-16: qty 57

## 2018-09-16 NOTE — Telephone Encounter (Signed)
Patient's daughter stopped by to inquire about the IV fluid appts that were added.  Val confirmed that they are on the schedule should the patient need them.  Gave patient new calendar for Nov appts.

## 2018-09-16 NOTE — Telephone Encounter (Signed)
Scheduled appt per 1/15 sch message - left message for patient with appt date and time   

## 2018-09-16 NOTE — Progress Notes (Signed)
Discharge instructions printed and verbally reviewed. Patient and daughter verbalized understanding. Denies concerns or complaints on exit.

## 2018-09-16 NOTE — Patient Instructions (Signed)
[facility] Discharge Instructions for Patients Receiving Chemotherapy  Today you received the following chemotherapy agents: Cytoxan, Methotrexate, 5FU.   To help prevent nausea and vomiting after your treatment, we encourage you to take your nausea medication as directed.   If you develop nausea and vomiting that is not controlled by your nausea medication, call the clinic.   BELOW ARE SYMPTOMS THAT SHOULD BE REPORTED IMMEDIATELY:  *FEVER GREATER THAN 100.5 F  *CHILLS WITH OR WITHOUT FEVER  NAUSEA AND VOMITING THAT IS NOT CONTROLLED WITH YOUR NAUSEA MEDICATION  *UNUSUAL SHORTNESS OF BREATH  *UNUSUAL BRUISING OR BLEEDING  TENDERNESS IN MOUTH AND THROAT WITH OR WITHOUT PRESENCE OF ULCERS  *URINARY PROBLEMS  *BOWEL PROBLEMS  UNUSUAL RASH Items with * indicate a potential emergency and should be followed up as soon as possible.  Feel free to call the clinic should you have any questions or concerns. The clinic phone number is [phone].  Please show the CHEMO ALERT CARD at check-in to the Emergency Department and triage nurse.  Cyclophosphamide injection What is this medicine? CYCLOPHOSPHAMIDE (sye kloe FOSS fa mide) is a chemotherapy drug. It slows the growth of cancer cells. This medicine is used to treat many types of cancer like lymphoma, myeloma, leukemia, breast cancer, and ovarian cancer, to name a few. This medicine may be used for other purposes; ask your health care provider or pharmacist if you have questions. COMMON BRAND NAME(S): Cytoxan, Neosar What should I tell my health care provider before I take this medicine? They need to know if you have any of these conditions: -blood disorders -history of other chemotherapy -infection -kidney disease -liver disease -recent or ongoing radiation therapy -tumors in the bone marrow -an unusual or allergic reaction to cyclophosphamide, other chemotherapy, other medicines, foods, dyes, or  preservatives -pregnant or trying to get pregnant -breast-feeding How should I use this medicine? This drug is usually given as an injection into a vein or muscle or by infusion into a vein. It is administered in a hospital or clinic by a specially trained health care professional. Talk to your pediatrician regarding the use of this medicine in children. Special care may be needed. Overdosage: If you think you have taken too much of this medicine contact a poison control center or emergency room at once. NOTE: This medicine is only for you. Do not share this medicine with others. What if I miss a dose? It is important not to miss your dose. Call your doctor or health care professional if you are unable to keep an appointment. What may interact with this medicine? This medicine may interact with the following medications: -amiodarone -amphotericin B -azathioprine -certain antiviral medicines for HIV or AIDS such as protease inhibitors (e.g., indinavir, ritonavir) and zidovudine -certain blood pressure medications such as benazepril, captopril, enalapril, fosinopril, lisinopril, moexipril, monopril, perindopril, quinapril, ramipril, trandolapril -certain cancer medications such as anthracyclines (e.g., daunorubicin, doxorubicin), busulfan, cytarabine, paclitaxel, pentostatin, tamoxifen, trastuzumab -certain diuretics such as chlorothiazide, chlorthalidone, hydrochlorothiazide, indapamide, metolazone -certain medicines that treat or prevent blood clots like warfarin -certain muscle relaxants such as succinylcholine -cyclosporine -etanercept -indomethacin -medicines to increase blood counts like filgrastim, pegfilgrastim, sargramostim -medicines used as general anesthesia -metronidazole -natalizumab This list may not describe all possible interactions. Give your health care provider a list of all the medicines, herbs, non-prescription drugs, or dietary supplements you use. Also tell them if  you smoke, drink alcohol, or use illegal drugs. Some items may interact with your medicine. What should I   watch for while using this medicine? Visit your doctor for checks on your progress. This drug may make you feel generally unwell. This is not uncommon, as chemotherapy can affect healthy cells as well as cancer cells. Report any side effects. Continue your course of treatment even though you feel ill unless your doctor tells you to stop. Drink water or other fluids as directed. Urinate often, even at night. In some cases, you may be given additional medicines to help with side effects. Follow all directions for their use. Call your doctor or health care professional for advice if you get a fever, chills or sore throat, or other symptoms of a cold or flu. Do not treat yourself. This drug decreases your body's ability to fight infections. Try to avoid being around people who are sick. This medicine may increase your risk to bruise or bleed. Call your doctor or health care professional if you notice any unusual bleeding. Be careful brushing and flossing your teeth or using a toothpick because you may get an infection or bleed more easily. If you have any dental work done, tell your dentist you are receiving this medicine. You may get drowsy or dizzy. Do not drive, use machinery, or do anything that needs mental alertness until you know how this medicine affects you. Do not become pregnant while taking this medicine or for 1 year after stopping it. Women should inform their doctor if they wish to become pregnant or think they might be pregnant. Men should not father a child while taking this medicine and for 4 months after stopping it. There is a potential for serious side effects to an unborn child. Talk to your health care professional or pharmacist for more information. Do not breast-feed an infant while taking this medicine. This medicine may interfere with the ability to have a child. This medicine  has caused ovarian failure in some women. This medicine has caused reduced sperm counts in some men. You should talk with your doctor or health care professional if you are concerned about your fertility. If you are going to have surgery, tell your doctor or health care professional that you have taken this medicine. What side effects may I notice from receiving this medicine? Side effects that you should report to your doctor or health care professional as soon as possible: -allergic reactions like skin rash, itching or hives, swelling of the face, lips, or tongue -low blood counts - this medicine may decrease the number of white blood cells, red blood cells and platelets. You may be at increased risk for infections and bleeding. -signs of infection - fever or chills, cough, sore throat, pain or difficulty passing urine -signs of decreased platelets or bleeding - bruising, pinpoint red spots on the skin, black, tarry stools, blood in the urine -signs of decreased red blood cells - unusually weak or tired, fainting spells, lightheadedness -breathing problems -dark urine -dizziness -palpitations -swelling of the ankles, feet, hands -trouble passing urine or change in the amount of urine -weight gain -yellowing of the eyes or skin Side effects that usually do not require medical attention (report to your doctor or health care professional if they continue or are bothersome): -changes in nail or skin color -hair loss -missed menstrual periods -mouth sores -nausea, vomiting This list may not describe all possible side effects. Call your doctor for medical advice about side effects. You may report side effects to FDA at 1-800-FDA-1088. Where should I keep my medicine? This drug is given in a  hospital or clinic and will not be stored at home. NOTE: This sheet is a summary. It may not cover all possible information. If you have questions about this medicine, talk to your doctor, pharmacist, or  health care provider.  2018 Elsevier/Gold Standard (2012-09-11 16:22:58)  Methotrexate injection What is this medicine? METHOTREXATE (METH oh TREX ate) is a chemotherapy drug used to treat cancer including breast cancer, leukemia, and lymphoma. This medicine can also be used to treat psoriasis and certain kinds of arthritis. This medicine may be used for other purposes; ask your health care provider or pharmacist if you have questions. What should I tell my health care provider before I take this medicine? They need to know if you have any of these conditions: -fluid in the stomach area or lungs -if you often drink alcohol -infection or immune system problems -kidney disease -liver disease -low blood counts, like low white cell, platelet, or red cell counts -lung disease -radiation therapy -stomach ulcers -ulcerative colitis -an unusual or allergic reaction to methotrexate, other medicines, foods, dyes, or preservatives -pregnant or trying to get pregnant -breast-feeding How should I use this medicine? This medicine is for infusion into a vein or for injection into muscle or into the spinal fluid (whichever applies). It is usually given by a health care professional in a hospital or clinic setting. In rare cases, you might get this medicine at home. You will be taught how to give this medicine. Use exactly as directed. Take your medicine at regular intervals. Do not take your medicine more often than directed. If this medicine is used for arthritis or psoriasis, it should be taken weekly, NOT daily. It is important that you put your used needles and syringes in a special sharps container. Do not put them in a trash can. If you do not have a sharps container, call your pharmacist or healthcare provider to get one. Talk to your pediatrician regarding the use of this medicine in children. While this drug may be prescribed for children as young as 2 years for selected conditions, precautions  do apply. Overdosage: If you think you have taken too much of this medicine contact a poison control center or emergency room at once. NOTE: This medicine is only for you. Do not share this medicine with others. What if I miss a dose? It is important not to miss your dose. Call your doctor or health care professional if you are unable to keep an appointment. If you give yourself the medicine and you miss a dose, talk with your doctor or health care professional. Do not take double or extra doses. What may interact with this medicine? This medicine may interact with the following medications: -acitretin -aspirin or aspirin-like medicines including salicylates -azathioprine -certain antibiotics like chloramphenicol, penicillin, tetracycline -certain medicines for stomach problems like esomeprazole, omeprazole, pantoprazole -cyclosporine -gold -hydroxychloroquine -live virus vaccines -mercaptopurine -NSAIDs, medicines for pain and inflammation, like ibuprofen or naproxen -other cytotoxic agents -penicillamine -phenylbutazone -phenytoin -probenacid -retinoids such as isotretinoin and tretinoin -steroid medicines like prednisone or cortisone -sulfonamides like sulfasalazine and trimethoprim/sulfamethoxazole -theophylline This list may not describe all possible interactions. Give your health care provider a list of all the medicines, herbs, non-prescription drugs, or dietary supplements you use. Also tell them if you smoke, drink alcohol, or use illegal drugs. Some items may interact with your medicine. What should I watch for while using this medicine? Avoid alcoholic drinks. In some cases, you may be given additional medicines to help with side effects.   Follow all directions for their use. This medicine can make you more sensitive to the sun. Keep out of the sun. If you cannot avoid being in the sun, wear protective clothing and use sunscreen. Do not use sun lamps or tanning  beds/booths. You may get drowsy or dizzy. Do not drive, use machinery, or do anything that needs mental alertness until you know how this medicine affects you. Do not stand or sit up quickly, especially if you are an older patient. This reduces the risk of dizzy or fainting spells. You may need blood work done while you are taking this medicine. Call your doctor or health care professional for advice if you get a fever, chills or sore throat, or other symptoms of a cold or flu. Do not treat yourself. This drug decreases your body's ability to fight infections. Try to avoid being around people who are sick. This medicine may increase your risk to bruise or bleed. Call your doctor or health care professional if you notice any unusual bleeding. Check with your doctor or health care professional if you get an attack of severe diarrhea, nausea and vomiting, or if you sweat a lot. The loss of too much body fluid can make it dangerous for you to take this medicine. Talk to your doctor about your risk of cancer. You may be more at risk for certain types of cancers if you take this medicine. Both men and women must use effective birth control with this medicine. Do not become pregnant while taking this medicine or until at least 1 normal menstrual cycle has occurred after stopping it. Women should inform their doctor if they wish to become pregnant or think they might be pregnant. Men should not father a child while taking this medicine and for 3 months after stopping it. There is a potential for serious side effects to an unborn child. Talk to your health care professional or pharmacist for more information. Do not breast-feed an infant while taking this medicine. What side effects may I notice from receiving this medicine? Side effects that you should report to your doctor or health care professional as soon as possible: -allergic reactions like skin rash, itching or hives, swelling of the face, lips, or  tongue -back pain -breathing problems or shortness of breath -confusion -diarrhea -dry, nonproductive cough -low blood counts - this medicine may decrease the number of white blood cells, red blood cells and platelets. You may be at increased risk of infections and bleeding -mouth sores -redness, blistering, peeling or loosening of the skin, including inside the mouth -seizures -severe headaches -signs of infection - fever or chills, cough, sore throat, pain or difficulty passing urine -signs and symptoms of bleeding such as bloody or black, tarry stools; red or dark-brown urine; spitting up blood or brown material that looks like coffee grounds; red spots on the skin; unusual bruising or bleeding from the eye, gums, or nose -signs and symptoms of kidney injury like trouble passing urine or change in the amount of urine -signs and symptoms of liver injury like dark yellow or brown urine; general ill feeling or flu-like symptoms; light-colored stools; loss of appetite; nausea; right upper belly pain; unusually weak or tired; yellowing of the eyes or skin -stiff neck -vomiting Side effects that usually do not require medical attention (report to your doctor or health care professional if they continue or are bothersome): -dizziness -hair loss -headache -stomach pain -upset stomach This list may not describe all possible side effects. Call   your doctor for medical advice about side effects. You may report side effects to FDA at 1-800-FDA-1088. Where should I keep my medicine? If you are using this medicine at home, you will be instructed on how to store this medicine. Throw away any unused medicine after the expiration date on the label. NOTE: This sheet is a summary. It may not cover all possible information. If you have questions about this medicine, talk to your doctor, pharmacist, or health care provider.  2018 Elsevier/Gold Standard (2015-02-16 12:36:41)  Fluorouracil, 5-FU  injection What is this medicine? FLUOROURACIL, 5-FU (flure oh YOOR a sil) is a chemotherapy drug. It slows the growth of cancer cells. This medicine is used to treat many types of cancer like breast cancer, colon or rectal cancer, pancreatic cancer, and stomach cancer. This medicine may be used for other purposes; ask your health care provider or pharmacist if you have questions. COMMON BRAND NAME(S): Adrucil What should I tell my health care provider before I take this medicine? They need to know if you have any of these conditions: -blood disorders -dihydropyrimidine dehydrogenase (DPD) deficiency -infection (especially a virus infection such as chickenpox, cold sores, or herpes) -kidney disease -liver disease -malnourished, poor nutrition -recent or ongoing radiation therapy -an unusual or allergic reaction to fluorouracil, other chemotherapy, other medicines, foods, dyes, or preservatives -pregnant or trying to get pregnant -breast-feeding How should I use this medicine? This drug is given as an infusion or injection into a vein. It is administered in a hospital or clinic by a specially trained health care professional. Talk to your pediatrician regarding the use of this medicine in children. Special care may be needed. Overdosage: If you think you have taken too much of this medicine contact a poison control center or emergency room at once. NOTE: This medicine is only for you. Do not share this medicine with others. What if I miss a dose? It is important not to miss your dose. Call your doctor or health care professional if you are unable to keep an appointment. What may interact with this medicine? -allopurinol -cimetidine -dapsone -digoxin -hydroxyurea -leucovorin -levamisole -medicines for seizures like ethotoin, fosphenytoin, phenytoin -medicines to increase blood counts like filgrastim, pegfilgrastim, sargramostim -medicines that treat or prevent blood clots like  warfarin, enoxaparin, and dalteparin -methotrexate -metronidazole -pyrimethamine -some other chemotherapy drugs like busulfan, cisplatin, estramustine, vinblastine -trimethoprim -trimetrexate -vaccines Talk to your doctor or health care professional before taking any of these medicines: -acetaminophen -aspirin -ibuprofen -ketoprofen -naproxen This list may not describe all possible interactions. Give your health care provider a list of all the medicines, herbs, non-prescription drugs, or dietary supplements you use. Also tell them if you smoke, drink alcohol, or use illegal drugs. Some items may interact with your medicine. What should I watch for while using this medicine? Visit your doctor for checks on your progress. This drug may make you feel generally unwell. This is not uncommon, as chemotherapy can affect healthy cells as well as cancer cells. Report any side effects. Continue your course of treatment even though you feel ill unless your doctor tells you to stop. In some cases, you may be given additional medicines to help with side effects. Follow all directions for their use. Call your doctor or health care professional for advice if you get a fever, chills or sore throat, or other symptoms of a cold or flu. Do not treat yourself. This drug decreases your body's ability to fight infections. Try to avoid being around people who   are sick. This medicine may increase your risk to bruise or bleed. Call your doctor or health care professional if you notice any unusual bleeding. Be careful brushing and flossing your teeth or using a toothpick because you may get an infection or bleed more easily. If you have any dental work done, tell your dentist you are receiving this medicine. Avoid taking products that contain aspirin, acetaminophen, ibuprofen, naproxen, or ketoprofen unless instructed by your doctor. These medicines may hide a fever. Do not become pregnant while taking this medicine.  Women should inform their doctor if they wish to become pregnant or think they might be pregnant. There is a potential for serious side effects to an unborn child. Talk to your health care professional or pharmacist for more information. Do not breast-feed an infant while taking this medicine. Men should inform their doctor if they wish to father a child. This medicine may lower sperm counts. Do not treat diarrhea with over the counter products. Contact your doctor if you have diarrhea that lasts more than 2 days or if it is severe and watery. This medicine can make you more sensitive to the sun. Keep out of the sun. If you cannot avoid being in the sun, wear protective clothing and use sunscreen. Do not use sun lamps or tanning beds/booths. What side effects may I notice from receiving this medicine? Side effects that you should report to your doctor or health care professional as soon as possible: -allergic reactions like skin rash, itching or hives, swelling of the face, lips, or tongue -low blood counts - this medicine may decrease the number of white blood cells, red blood cells and platelets. You may be at increased risk for infections and bleeding. -signs of infection - fever or chills, cough, sore throat, pain or difficulty passing urine -signs of decreased platelets or bleeding - bruising, pinpoint red spots on the skin, black, tarry stools, blood in the urine -signs of decreased red blood cells - unusually weak or tired, fainting spells, lightheadedness -breathing problems -changes in vision -chest pain -mouth sores -nausea and vomiting -pain, swelling, redness at site where injected -pain, tingling, numbness in the hands or feet -redness, swelling, or sores on hands or feet -stomach pain -unusual bleeding Side effects that usually do not require medical attention (report to your doctor or health care professional if they continue or are bothersome): -changes in finger or toe  nails -diarrhea -dry or itchy skin -hair loss -headache -loss of appetite -sensitivity of eyes to the light -stomach upset -unusually teary eyes This list may not describe all possible side effects. Call your doctor for medical advice about side effects. You may report side effects to FDA at 1-800-FDA-1088. Where should I keep my medicine? This drug is given in a hospital or clinic and will not be stored at home. NOTE: This sheet is a summary. It may not cover all possible information. If you have questions about this medicine, talk to your doctor, pharmacist, or health care provider.  2018 Elsevier/Gold Standard (2008-03-02 13:53:16)      

## 2018-09-16 NOTE — Telephone Encounter (Signed)
No los per 11/5. °

## 2018-09-21 ENCOUNTER — Inpatient Hospital Stay: Payer: Medicare Other

## 2018-09-21 ENCOUNTER — Telehealth: Payer: Self-pay | Admitting: Adult Health

## 2018-09-21 NOTE — Telephone Encounter (Signed)
Spoke to pts daughter regarding appts per 11/11 sch message.  pts daughter requested appts to be r/s to 11/29. I informed daughter that doctor and app would not be avail that day. Asked pts daughter to speak to app during next appt regarding questions she has

## 2018-09-22 ENCOUNTER — Inpatient Hospital Stay: Payer: Medicare Other

## 2018-09-23 ENCOUNTER — Inpatient Hospital Stay (HOSPITAL_BASED_OUTPATIENT_CLINIC_OR_DEPARTMENT_OTHER): Payer: Medicare Other | Admitting: Adult Health

## 2018-09-23 ENCOUNTER — Telehealth: Payer: Self-pay | Admitting: Adult Health

## 2018-09-23 ENCOUNTER — Other Ambulatory Visit: Payer: Self-pay | Admitting: Oncology

## 2018-09-23 ENCOUNTER — Inpatient Hospital Stay: Payer: Medicare Other

## 2018-09-23 ENCOUNTER — Encounter: Payer: Self-pay | Admitting: *Deleted

## 2018-09-23 ENCOUNTER — Encounter: Payer: Self-pay | Admitting: Adult Health

## 2018-09-23 VITALS — BP 158/58 | HR 93 | Temp 97.8°F | Resp 18 | Ht 66.0 in | Wt 168.2 lb

## 2018-09-23 DIAGNOSIS — Z170 Estrogen receptor positive status [ER+]: Secondary | ICD-10-CM | POA: Diagnosis not present

## 2018-09-23 DIAGNOSIS — R5383 Other fatigue: Secondary | ICD-10-CM

## 2018-09-23 DIAGNOSIS — C50811 Malignant neoplasm of overlapping sites of right female breast: Secondary | ICD-10-CM

## 2018-09-23 DIAGNOSIS — I63132 Cerebral infarction due to embolism of left carotid artery: Secondary | ICD-10-CM

## 2018-09-23 DIAGNOSIS — R911 Solitary pulmonary nodule: Secondary | ICD-10-CM | POA: Diagnosis not present

## 2018-09-23 DIAGNOSIS — I69359 Hemiplegia and hemiparesis following cerebral infarction affecting unspecified side: Secondary | ICD-10-CM

## 2018-09-23 DIAGNOSIS — C50911 Malignant neoplasm of unspecified site of right female breast: Secondary | ICD-10-CM

## 2018-09-23 DIAGNOSIS — Z79899 Other long term (current) drug therapy: Secondary | ICD-10-CM | POA: Diagnosis not present

## 2018-09-23 DIAGNOSIS — Z8673 Personal history of transient ischemic attack (TIA), and cerebral infarction without residual deficits: Secondary | ICD-10-CM | POA: Diagnosis not present

## 2018-09-23 DIAGNOSIS — Z5111 Encounter for antineoplastic chemotherapy: Secondary | ICD-10-CM | POA: Diagnosis not present

## 2018-09-23 LAB — COMPREHENSIVE METABOLIC PANEL
ALT: 15 U/L (ref 0–44)
ANION GAP: 8 (ref 5–15)
AST: 12 U/L — ABNORMAL LOW (ref 15–41)
Albumin: 3.8 g/dL (ref 3.5–5.0)
Alkaline Phosphatase: 84 U/L (ref 38–126)
BUN: 17 mg/dL (ref 8–23)
CO2: 29 mmol/L (ref 22–32)
Calcium: 8.9 mg/dL (ref 8.9–10.3)
Chloride: 105 mmol/L (ref 98–111)
Creatinine, Ser: 0.94 mg/dL (ref 0.44–1.00)
Glucose, Bld: 109 mg/dL — ABNORMAL HIGH (ref 70–99)
Potassium: 3.3 mmol/L — ABNORMAL LOW (ref 3.5–5.1)
Sodium: 142 mmol/L (ref 135–145)
Total Bilirubin: 0.3 mg/dL (ref 0.3–1.2)
Total Protein: 7.4 g/dL (ref 6.5–8.1)

## 2018-09-23 LAB — CBC WITH DIFFERENTIAL/PLATELET
ABS IMMATURE GRANULOCYTES: 0.01 10*3/uL (ref 0.00–0.07)
BASOS PCT: 1 %
Basophils Absolute: 0 10*3/uL (ref 0.0–0.1)
Eosinophils Absolute: 0.2 10*3/uL (ref 0.0–0.5)
Eosinophils Relative: 5 %
HCT: 36.8 % (ref 36.0–46.0)
Hemoglobin: 11.4 g/dL — ABNORMAL LOW (ref 12.0–15.0)
Immature Granulocytes: 0 %
Lymphocytes Relative: 34 %
Lymphs Abs: 1.2 10*3/uL (ref 0.7–4.0)
MCH: 25.3 pg — AB (ref 26.0–34.0)
MCHC: 31 g/dL (ref 30.0–36.0)
MCV: 81.6 fL (ref 80.0–100.0)
MONO ABS: 0.2 10*3/uL (ref 0.1–1.0)
Monocytes Relative: 5 %
NEUTROS ABS: 2 10*3/uL (ref 1.7–7.7)
NEUTROS PCT: 55 %
NRBC: 0 % (ref 0.0–0.2)
PLATELETS: 190 10*3/uL (ref 150–400)
RBC: 4.51 MIL/uL (ref 3.87–5.11)
RDW: 13.6 % (ref 11.5–15.5)
WBC: 3.6 10*3/uL — AB (ref 4.0–10.5)

## 2018-09-23 NOTE — Telephone Encounter (Signed)
Spoke with patient daughter in regards to the change of the patients treatment time for Oct 09, 2018.

## 2018-09-23 NOTE — Progress Notes (Signed)
[place]  Telephone:([phone]) [phone] Fax:([phone]) [phone]     ID: [name] [name] DOB: July 22, 1946  MR#: [id number]  OBS#:[id number]  Patient Care Team: [name], MD as PCP - General (Internal Medicine) Waynetta Sandy, MD as Consulting Physician (Vascular Surgery) Patsey Berthold, NP as Nurse Practitioner (Cardiology) [name], MD as Consulting Physician (Physical Medicine and Rehabilitation) [name], [name], NP as Nurse Practitioner (Vascular Surgery) Edgardo Roys, PsyD as Consulting Physician (Psychology) Magrinat, [name], MD as Consulting Physician (Oncology) [name], MD as Consulting Physician (General Surgery) Eppie Gibson, MD as Attending Physician (Radiation Oncology) OTHER MD:  CHIEF COMPLAINT: Inflammatory breast cancer  CURRENT TREATMENT: Neoadjuvant chemotherapy   HISTORY OF CURRENT ILLNESS: [name] [name] presented with right breast discoloration and redness. The right breast also had an area of hardness and nipple retraction.  She did not bring this to medical attention but her daughter did mention it when the patient had neurologic follow-up on 08/10/2018.  At that time the nurse practitioner, [name], noted right nipple inversion, hard breast, and red non-ulcerated lesions on the breast.  The patient then underwent bilateral diagnostic mammography with tomography and right breast ultrasonography at [facility] on 08/11/2018 (this is her first ever mammogram) showing: breast density category B. There was a round 2.5 cm mass at the 1 o'clock upper inner quadrant of the right breast.  By palpation this measured approximately 10 cm.  Sonographically the irregular hypoechoic mass measured 2.7 cm. There was also a hypoechoic mass in the right breast at 9 o'clock upper outer quadrant measuring 3.8 cm. There was an abnormal right axillary lymph node with cortical thickening.   Accordingly on 08/19/2018 she proceeded to biopsy of  the right breast areas in question. The pathology from this procedure showed [phone]): At the 1 o'clock: invasive lobular carcinoma, grade II. Prognostic indicators significant for: estrogen receptor, 60% positive with moderate staining intensity and progesterone receptor, 0% negative. Proliferation marker Ki67 at 20%. HER2 negative with an immunohistochemistry of (1+).  At the 9 o'clock: invasive lobular carcinoma, grade II. Prognostic indicators significant for: estrogen receptor, 90% positive with strong staining intensity and progesterone receptor, 0% negative. Proliferation marker Ki67 at 30%. HER2 negative with an immunohistochemistry of (1+).  The patient's subsequent history is as detailed below.  INTERVAL HISTORY: [name] [name] returns today for further evaluation and treatment of her locally advanced breast cancer, accompanied by her daughter.  She is cycle 1 day 8 after receiving her first neoadjuvant treatments with [facility].  She receives this on day 1 of a 21 day cycle.  She tolerated this well and says that she hasn't noted any troublesome side effects.  She has been constipated however that resolved, her last bowel movement was this morning.     REVIEW OF SYSTEMS: [name] is feeling well today.  She notes that her appetite is unchanged.  She has intermittent fatigue, however it does not interfere with her normal activities.  She does feel like her breast is unchanged.  She tells me that she tolerated chemotherapy well.  She denies any nausea or vomiting or any other issues today..  A detailed ROS was conducted and was otherwise non contributory today.    PAST MEDICAL HISTORY: Past Medical History:  Diagnosis Date  . Anxiety   . Cancer [facility])    breast cancer- right  . Carotid stenosis   . Depression    situational  . Dyspnea    with much ambulation  . GERD (gastroesophageal  reflux disease)   . Hypertension   . Stroke ([place]) 04/01/2017   a little weakness right side leg and hand, a  little memry loss    PAST SURGICAL HISTORY: Past Surgical History:  Procedure Laterality Date  . ABDOMINAL HYSTERECTOMY    . ENDARTERECTOMY Left 04/08/2017   Procedure: ENDARTERECTOMY CAROTID;  Surgeon: Waynetta Sandy, MD;  Location: [place];  Service: Vascular;  Laterality: Left;  . PATCH ANGIOPLASTY Left 04/08/2017   Procedure: PATCH ANGIOPLASTY Left Carotid;  Surgeon: Waynetta Sandy, MD;  Location: [place];  Service: Vascular;  Laterality: Left;  . PORTACATH PLACEMENT Left 09/07/2018   Procedure: INSERTION PORT-A-CATH LEFT INTERNAL JUGULAR;  Surgeon: [name], MD;  Location: [place];  Service: General;  Laterality: Left;  total hysterectomy with bilateral salpingo-oophorectomy in her 85's no hormone replacement  FAMILY HISTORY Family History  Problem Relation Age of Onset  . Cancer Mother        ovarian  The patient's father is alive at age 18. The patient's mother died at age 51 due to ovarian cancer. The patient had 3 brothers and 3 sisters. She denies a history of breast, pancreatic, colon, or other cancers in the family.   GYNECOLOGIC HISTORY:  No LMP recorded. Patient has had a hysterectomy. Menarche: Age at first live birth: 72 years old She is GX P1.  She is status post total hysterectomy with BSO in her early 48's. She did not use HRT.    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. She is divorced. At home is just herself, but her two grandsons regularly come to visit and stay with her. The patient's daughter, [name] [name] works in administration for [employer]. The patient's 18 y.o. grandson is a Freight forwarder for [employer]. The patient's 61 y.o. grandson works in [employer].     ADVANCED DIRECTIVES: Not in place.  At the 08/26/2018 visit the patient was given the appropriate documents to complete and notarized at her discretion.   HEALTH MAINTENANCE: Social History   Tobacco Use  . Smoking status: Never Smoker  .  Smokeless tobacco: Never Used  Substance Use Topics  . Alcohol use: No  . Drug use: No     Colonoscopy: Never  PAP: Status post hysterectomy  Bone density: Never   No Known Allergies  Current Outpatient Medications  Medication Sig Dispense Refill  . amLODipine (NORVASC) 10 MG tablet Take 1 tablet (10 mg total) by mouth daily. PATIENT MUST HAVE OFFICE VISIT AND LABS PRIOR TO ANY FURTHER REFILLS 15 tablet 0  . atorvastatin (LIPITOR) 80 MG tablet Take 1 tablet (80 mg total) by mouth daily at 6 PM. 30 tablet 0  . clopidogrel (PLAVIX) 75 MG tablet TAKE 1 TABLET BY MOUTH EVERY DAY 30 tablet 1  . lidocaine-prilocaine (EMLA) cream Apply to affected area once (Patient taking differently: Apply 1 application topically daily as needed (port access). Apply to affected area once) 30 g 3  . lisinopril-hydrochlorothiazide (PRINZIDE,ZESTORETIC) 20-12.5 MG tablet Take 1 tablet by mouth daily.  11  . loratadine (CLARITIN) 10 MG tablet Take 10 mg by mouth daily as needed for allergies.    . pantoprazole (PROTONIX) 40 MG tablet TAKE 1 TABLET BY MOUTH EVERY DAY 30 tablet 1  . prochlorperazine (COMPAZINE) 10 MG tablet Take 1 tablet (10 mg total) by mouth every 6 (six) hours as needed (Nausea or vomiting). 30 tablet 1  . traZODone (DESYREL) 50 MG tablet Take 0.5-1 tablets (  25-50 mg total) by mouth at bedtime as needed for sleep. (Patient taking differently: Take 50 mg by mouth at bedtime as needed for sleep. ) 15 tablet 0   No current facility-administered medications for this visit.     OBJECTIVE:  Vitals:   09/23/18 1010  BP: (!) 158/58  Pulse: 93  Resp: 18  Temp: 97.8 F (36.6 C)  SpO2: 100%     Body mass index is 27.15 kg/m.   Wt Readings from Last 3 Encounters:  09/23/18 168 lb 3.2 oz (76.3 kg)  09/15/18 170 lb 6.4 oz (77.3 kg)  09/07/18 170 lb (77.1 kg)      ECOG FS:2 - Symptomatic, <50% confined to bed  GENERAL: Patient is a well appearing female in no acute distress HEENT:  Sclerae  anicteric.  Oropharynx clear and moist. No ulcerations or evidence of oropharyngeal candidiasis. Neck is supple.  NODES:  No cervical, supraclavicular, or axillary lymphadenopathy palpated.  BREAST EXAM:  See pictures below. LUNGS:  Clear to auscultation bilaterally.  No wheezes or rhonchi. HEART:  Regular rate and rhythm. No murmur appreciated. ABDOMEN:  Soft, nontender.  Positive, normoactive bowel sounds. No organomegaly palpated. MSK:  No focal spinal tenderness to palpation. Full range of motion bilaterally in the upper extremities. EXTREMITIES:  No peripheral edema.   SKIN:  Clear with no obvious rashes or skin changes. No nail dyscrasia. NEURO:  Nonfocal. Well oriented.  Appropriate affect.     Right Breast 08/26/2018   09/23/18 Cycle 1 day 8     LAB RESULTS:  CMP     Component Value Date/Time   NA 142 09/23/2018 0952   K 3.3 (L) 09/23/2018 0952   CL 105 09/23/2018 0952   CO2 29 09/23/2018 0952   GLUCOSE 109 (H) 09/23/2018 0952   BUN 17 09/23/2018 0952   CREATININE 0.94 09/23/2018 0952   CREATININE 1.08 (H) 08/26/2018 1235   CALCIUM 8.9 09/23/2018 0952   PROT 7.4 09/23/2018 0952   ALBUMIN 3.8 09/23/2018 0952   AST 12 (L) 09/23/2018 0952   AST 15 08/26/2018 1235   ALT 15 09/23/2018 0952   ALT 20 08/26/2018 1235   ALKPHOS 84 09/23/2018 0952   BILITOT 0.3 09/23/2018 0952   BILITOT 0.4 08/26/2018 1235   GFRNONAA >60 09/23/2018 0952   GFRNONAA 51 (L) 08/26/2018 1235   GFRAA >60 09/23/2018 0952   GFRAA 59 (L) 08/26/2018 1235    No results found for: TOTALPROTELP, ALBUMINELP, A1GS, A2GS, BETS, BETA2SER, GAMS, MSPIKE, SPEI  No results found for: KPAFRELGTCHN, LAMBDASER, KAPLAMBRATIO  Lab Results  Component Value Date   WBC 3.6 (L) 09/23/2018   NEUTROABS 2.0 09/23/2018   HGB 11.4 (L) 09/23/2018   HCT 36.8 09/23/2018   MCV 81.6 09/23/2018   PLT 190 09/23/2018    _0 @  No results found for: LABCA2  No components found for: [id number]  No  results for input(s): INR in the last 168 hours.  No results found for: LABCA2  No results found for: CAN199  No results found for: TDD220  No results found for: URK270  Lab Results  Component Value Date   CA2729 54.4 (H) 09/15/2018    No components found for: HGQUANT  No results found for: CEA1 / No results found for: CEA1   No results found for: AFPTUMOR  No results found for: [place]  No results found for: PSA1  Appointment on 09/23/2018  Component Date Value Ref Range Status  . Sodium 09/23/2018 142  135 -  145 mmol/L Final  . Potassium 09/23/2018 3.3* 3.5 - 5.1 mmol/L Final  . Chloride 09/23/2018 105  98 - 111 mmol/L Final  . CO2 09/23/2018 29  22 - 32 mmol/L Final  . Glucose, Bld 09/23/2018 109* 70 - 99 mg/dL Final  . BUN 09/23/2018 17  8 - 23 mg/dL Final  . Creatinine, Ser 09/23/2018 0.94  0.44 - 1.00 mg/dL Final  . Calcium 09/23/2018 8.9  8.9 - 10.3 mg/dL Final  . Total Protein 09/23/2018 7.4  6.5 - 8.1 g/dL Final  . Albumin 09/23/2018 3.8  3.5 - 5.0 g/dL Final  . AST 09/23/2018 12* 15 - 41 U/L Final  . ALT 09/23/2018 15  0 - 44 U/L Final  . Alkaline Phosphatase 09/23/2018 84  38 - 126 U/L Final  . Total Bilirubin 09/23/2018 0.3  0.3 - 1.2 mg/dL Final  . GFR calc non Af Amer 09/23/2018 >60  >60 mL/min Final  . GFR calc Af Amer 09/23/2018 >60  >60 mL/min Final   Comment: (NOTE) The eGFR has been calculated using the CKD EPI equation. This calculation has not been validated in all clinical situations. eGFR's persistently <60 mL/min signify possible Chronic Kidney Disease.   [name] [name] 09/23/2018 8  5 - 15 Final   Performed at [facility], [place] [address] [place], [place] [address]  . WBC 09/23/2018 3.6* 4.0 - 10.5 K/uL Final  . RBC 09/23/2018 4.51  3.87 - 5.11 MIL/uL Final  . Hemoglobin 09/23/2018 11.4* 12.0 - 15.0 g/dL Final  . HCT 09/23/2018 36.8  36.0 - 46.0 % Final  . MCV 09/23/2018 81.6  80.0 - 100.0 fL Final  . MCH  09/23/2018 25.3* 26.0 - 34.0 pg Final  . MCHC 09/23/2018 31.0  30.0 - 36.0 g/dL Final  . RDW 09/23/2018 13.6  11.5 - 15.5 % Final  . Platelets 09/23/2018 190  150 - 400 K/uL Final  . nRBC 09/23/2018 0.0  0.0 - 0.2 % Final  . Neutrophils Relative % 09/23/2018 55  % Final  . Neutro Abs 09/23/2018 2.0  1.7 - 7.7 K/uL Final  . Lymphocytes Relative 09/23/2018 34  % Final  . Lymphs Abs 09/23/2018 1.2  0.7 - 4.0 K/uL Final  . Monocytes Relative 09/23/2018 5  % Final  . Monocytes Absolute 09/23/2018 0.2  0.1 - 1.0 K/uL Final  . Eosinophils Relative 09/23/2018 5  % Final  . Eosinophils Absolute 09/23/2018 0.2  0.0 - 0.5 K/uL Final  . Basophils Relative 09/23/2018 1  % Final  . Basophils Absolute 09/23/2018 0.0  0.0 - 0.1 K/uL Final  . Immature Granulocytes 09/23/2018 0  % Final  . Abs Immature Granulocytes 09/23/2018 0.01  0.00 - 0.07 K/uL Final   Performed at [facility], [place] [address] [place], [place] [address]    (this displays the last labs from the last 3 days)  No results found for: TOTALPROTELP, ALBUMINELP, A1GS, A2GS, BETS, BETA2SER, GAMS, MSPIKE, SPEI (this displays SPEP labs)  No results found for: KPAFRELGTCHN, LAMBDASER, KAPLAMBRATIO (kappa/lambda light chains)  No results found for: HGBA, HGBA2QUANT, HGBFQUANT, HGBSQUAN (Hemoglobinopathy evaluation)   No results found for: LDH  No results found for: IRON, TIBC, IRONPCTSAT (Iron and TIBC)  No results found for: FERRITIN  Urinalysis    Component Value Date/Time   COLORURINE AMBER (A) 04/01/2017 0920   APPEARANCEUR CLOUDY (A) 04/01/2017 0920   LABSPEC 1.021 04/01/2017 0920   PHURINE 5.0 04/01/2017 0920   GLUCOSEU  NEGATIVE 04/01/2017 0920   HGBUR MODERATE (A) 04/01/2017 0920   BILIRUBINUR NEGATIVE 04/01/2017 0920   KETONESUR 80 (A) 04/01/2017 0920   PROTEINUR NEGATIVE 04/01/2017 0920   NITRITE NEGATIVE 04/01/2017 0920   LEUKOCYTESUR LARGE (A) 04/01/2017 0920     STUDIES: Ct Chest W  Contrast  Result Date: 09/02/2018 CLINICAL DATA:  Right breast cancer staging workup EXAM: CT CHEST, ABDOMEN, AND PELVIS WITH CONTRAST TECHNIQUE: Multidetector CT imaging of the chest, abdomen and pelvis was performed following the standard protocol during bolus administration of intravenous contrast. CONTRAST:  166m OMNIPAQUE IOHEXOL 300 MG/ML  SOLN COMPARISON:  Brachial plexus MRI from 04/01/2017 FINDINGS: CT CHEST FINDINGS Cardiovascular: Atherosclerotic calcification of the aortic arch, left anterior descending coronary artery, right coronary artery, and circumflex coronary artery. Mediastinum/Nodes: Mostly [place] right axillary and subpectoral lymph nodes including a 7 mm right axillary node on image 23/2. Lungs/Pleura: Biapical pleuroparenchymal scarring. Cylindrical bronchiectasis in both lower lobes. 5 mm ground-glass density nodule in the left upper lobe, image 40/5. 0.5 by 0.3 cm sub solid left upper lobe nodule, image 61/5. Scarring or atelectasis in the left lower lobe near the hemidiaphragm. Musculoskeletal: Mild thoracic kyphosis and spondylosis. I do not observe compelling evidence of active osseous metastatic disease. There is abnormal asymmetric density and enhancement throughout the glandular tissues of the right breast but especially in the upper breast extending to the cutaneous surface, and also extending to the superficial fascial surface of the pectoralis major muscle, suspicious for inflammatory breast cancer. CT ABDOMEN PELVIS FINDINGS Hepatobiliary: Unremarkable Pancreas: Unremarkable Spleen: Unremarkable Adrenals/Urinary Tract: 3 mm right kidney lower pole nonobstructive renal calculus. 4 mm left kidney lower pole nonobstructive renal calculus. No hydronephrosis or hydroureter. Adrenal glands normal. Stomach/Bowel: Unremarkable Vascular/Lymphatic: Aortoiliac atherosclerotic vascular disease. Reproductive: Uterus surgically absent.  Adnexa unremarkable. Other: Indistinct hazy appearance  of the central mesentery. No ascites. Musculoskeletal: Grade 1 degenerative anterolisthesis at L4-5. Lumbar degenerative disc disease. Degenerative facet arthropathy in the lower lumbar spine. IMPRESSION: 1. Abnormal asymmetric density throughout the right breast extending to the cutaneous surface and the superficial fascia margin of the pectoralis major, compatible with the patient's diagnosis of inflammatory breast cancer. [place] right axillary and subpectoral lymph nodes are not pathologically enlarged and no compelling findings of distant metastatic spread are identified currently. 2. 5 mm ground-glass density nodule in the left upper lobe and separate 4 mm sub solid left upper lobe nodule. The sub solid appearance would be unusual for metastatic lesion but low-grade adenocarcinoma is not completely excluded. No follow-up needed if patient is low-risk (and has no known or suspected primary neoplasm). I would suggest follow up chest CT within the next 12 months. 3. Single bilateral nonobstructive renal calculi. 4. Aortic Atherosclerosis (ICD10-I70.0). Notable coronary atherosclerosis. 5. Lumbar spondylosis and degenerative disc disease. 6. Hazy appearance of the central mesentery. 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 Electronically Signed   By: WVan ClinesM.D.   On: 09/02/2018 12:03   Nm Bone Scan Whole Body  Result Date: 09/10/2018 CLINICAL DATA:  Right breast cancer EXAM: NUCLEAR MEDICINE WHOLE BODY BONE SCAN TECHNIQUE: Whole body anterior and posterior images were obtained approximately 3 hours after intravenous injection of radiopharmaceutical. RADIOPHARMACEUTICALS:  19.9 mCi Technetium-943mDP IV COMPARISON:  CT scan September 01, 2018 FINDINGS: Degenerative changes in the knees, left first MTP joint, and spine. No other significant bony abnormalities. Increased soft tissue uptake in the right breast,  consistent with  known malignancy. No other acute abnormalities identified. IMPRESSION: No bony metastatic disease noted. Electronically Signed   By: Dorise Bullion III M.D   On: 09/10/2018 15:32   Ct Abdomen Pelvis W Contrast  Result Date: 09/02/2018 CLINICAL DATA:  Right breast cancer staging workup EXAM: CT CHEST, ABDOMEN, AND PELVIS WITH CONTRAST TECHNIQUE: Multidetector CT imaging of the chest, abdomen and pelvis was performed following the standard protocol during bolus administration of intravenous contrast. CONTRAST:  158m OMNIPAQUE IOHEXOL 300 MG/ML  SOLN COMPARISON:  Brachial plexus MRI from 04/01/2017 FINDINGS: CT CHEST FINDINGS Cardiovascular: Atherosclerotic calcification of the aortic arch, left anterior descending coronary artery, right coronary artery, and circumflex coronary artery. Mediastinum/Nodes: Mostly [place] right axillary and subpectoral lymph nodes including a 7 mm right axillary node on image 23/2. Lungs/Pleura: Biapical pleuroparenchymal scarring. Cylindrical bronchiectasis in both lower lobes. 5 mm ground-glass density nodule in the left upper lobe, image 40/5. 0.5 by 0.3 cm sub solid left upper lobe nodule, image 61/5. Scarring or atelectasis in the left lower lobe near the hemidiaphragm. Musculoskeletal: Mild thoracic kyphosis and spondylosis. I do not observe compelling evidence of active osseous metastatic disease. There is abnormal asymmetric density and enhancement throughout the glandular tissues of the right breast but especially in the upper breast extending to the cutaneous surface, and also extending to the superficial fascial surface of the pectoralis major muscle, suspicious for inflammatory breast cancer. CT ABDOMEN PELVIS FINDINGS Hepatobiliary: Unremarkable Pancreas: Unremarkable Spleen: Unremarkable Adrenals/Urinary Tract: 3 mm right kidney lower pole nonobstructive renal calculus. 4 mm left kidney lower pole nonobstructive renal calculus. No hydronephrosis or  hydroureter. Adrenal glands normal. Stomach/Bowel: Unremarkable Vascular/Lymphatic: Aortoiliac atherosclerotic vascular disease. Reproductive: Uterus surgically absent.  Adnexa unremarkable. Other: Indistinct hazy appearance of the central mesentery. No ascites. Musculoskeletal: Grade 1 degenerative anterolisthesis at L4-5. Lumbar degenerative disc disease. Degenerative facet arthropathy in the lower lumbar spine. IMPRESSION: 1. Abnormal asymmetric density throughout the right breast extending to the cutaneous surface and the superficial fascia margin of the pectoralis major, compatible with the patient's diagnosis of inflammatory breast cancer. [place] right axillary and subpectoral lymph nodes are not pathologically enlarged and no compelling findings of distant metastatic spread are identified currently. 2. 5 mm ground-glass density nodule in the left upper lobe and separate 4 mm sub solid left upper lobe nodule. The sub solid appearance would be unusual for metastatic lesion but low-grade adenocarcinoma is not completely excluded. No follow-up needed if patient is low-risk (and has no known or suspected primary neoplasm). I would suggest follow up chest CT within the next 12 months. 3. Single bilateral nonobstructive renal calculi. 4. Aortic Atherosclerosis (ICD10-I70.0). Notable coronary atherosclerosis. 5. Lumbar spondylosis and degenerative disc disease. 6. Hazy appearance of the central mesentery. 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 Electronically Signed   By: WVan ClinesM.D.   On: 09/02/2018 12:03   Mr Breast Bilateral W Wo Contrast Inc Cad  Result Date: 09/14/2018 CLINICAL DATA:  72year old female with recently diagnosed inflammatory right breast cancer post ultrasound-guided biopsy masses at the 1 o'clock and 9 o'clock positions. Possible abnormal lymph node was identified however due to deep location  was not biopsied. LABS:  Not applicable. EXAM: BILATERAL BREAST MRI WITH AND WITHOUT CONTRAST TECHNIQUE: Multiplanar, multisequence MR images of both breasts were obtained prior to and following the intravenous administration of 8 ml of Gadavist Three-dimensional MR images were rendered by post-processing of the original MR data on  an independent workstation. The three-dimensional MR images were interpreted, and findings are reported in the following complete MRI report for this study. Three dimensional images were evaluated at the independent DynaCad workstation COMPARISON:  Previous exam(s). FINDINGS: Breast composition: c.  Heterogeneous fibroglandular tissue. Background parenchymal enhancement: Moderate. Right breast: There is right breast and nipple retraction with associated skin and trabecular thickening. Biopsy clip artifact is present in the upper inner and periareolar/slightly outer right breast at sites of biopsy proven malignancy. There is abnormal masslike enhancement of nearly the entire upper right breast extending to the skin and extending inferiorly to involve the central to lower outer quadrant, in entirety measuring at least 12.6 x 12.4 x 11 cm. This mass appears to infiltrate into the pectoralis muscle of the upper inner right breast (subtraction image 48). Left breast: No mass or abnormal enhancement. Lymph nodes: No definite morphologically abnormal lymph nodes. Ancillary findings:  None. IMPRESSION: 1. Right breast malignancy involving a large portion of the right breast, predominantly the entire upper right breast and extending inferiorly to involve the central to lower outer quadrant, and in entirety measuring at least 12.6 x 12.4 x 11 cm (although difficult to place a true measurement on this large mass). The mass extends to involve the skin of the anterior right breast and extends to and involves the pectoralis muscle. 2.  No definite right axillary lymphadenopathy. 3.  No MRI evidence of  malignancy in the left breast. RECOMMENDATION: Treatment plan for known right breast malignancy. BI-RADS CATEGORY  6: Known biopsy-proven malignancy. Electronically Signed   By: Everlean Alstrom M.D.   On: 09/14/2018 15:42   Dg Chest Port 1 View  Result Date: 09/07/2018 CLINICAL DATA:  Port-A-Cath placement.  Breast cancer EXAM: PORTABLE CHEST 1 VIEW COMPARISON:  CT chest 09/01/2018 FINDINGS: Left jugular Port-A-Cath tip in the mid SVC.  No pneumothorax Heart size upper normal. Mild atelectasis in the bases. Negative for infiltrate or effusion. IMPRESSION: Satisfactory Port-A-Cath placement. Electronically Signed   By: Franchot Gallo M.D.   On: 09/07/2018 14:09   Dg Fluoro Guide Cv Line-no Report  Result Date: 09/07/2018 Fluoroscopy was utilized by the requesting physician.  No radiographic interpretation.     ELIGIBLE FOR AVAILABLE RESEARCH PROTOCOL: no  ASSESSMENT: 72 y.o. [place], [place] woman status post right breast biopsy of overlapping sites on 08/19/2018 for a clinical T4 N1, stage IIIB invasive lobular carcinoma, estrogen receptor positive, progesterone receptor negative, HER-2 not amplified, with an MIB-1 of 20%.  (1) genetics testing pending  (2) neoadjuvant chemotherapy to consist of cyclophosphamide, methotrexate and fluorouracil every 21 days x 8,  begin 09/16/2018  (3) definitive surgery to follow  (4) adjuvant radiation  (5) antiestrogens therapy at the completion of local treatment  (6) foundation 1/PD-L1 testing  PLAN:  Murlean is doing well today.  I reviewed her labs with her and her daughter, they are stable.  She tolerated her first cycle well.  I pulled up her picture of her breast from prior to treatment.  It appears to have improved slightly which is promising.  Her daughter looked as well and agrees.    I reviewed the plan with Arshiya, and her daughter.  She will continue on CMF every 3 weeks and will return on Friday, 10/09/18 for this.  We will also see  her one week after her treatments.  She knows to call for any questions or concerns in the interim.  The above was reviewed with Dr. Jana Hakim in detail who is  in agreement with the plan.    A total of (20) minutes of face-to-face time was spent with this patient with greater than 50% of that time in counseling and care-coordination.   Wilber Bihari, NP  09/23/18 1:00 PM Medical Oncology and Hematology [facility] [address] [place], [place] [address] Tel. [phone]    Fax. [phone]

## 2018-09-23 NOTE — Progress Notes (Unsigned)
[place]  Telephone:(336) [phone] Fax:(336) [phone]     ID: [name] [name] DOB: [birth year]  MR#: [id number]  HOZ#:[id number]  Patient Care Team: [name] Carol, MD as PCP - General (Internal Medicine) [name] Sandy, MD as Consulting Physician (Vascular Surgery) [name] Berthold, NP as Nurse Practitioner (Cardiology) [name] Staggers, MD as Consulting Physician (Physical Medicine and Rehabilitation) [name], [name], NP as Nurse Practitioner (Vascular Surgery) [name] Roys, PsyD as Consulting Physician (Psychology) [name] [name], [name] Dad, MD as Consulting Physician (Oncology) [name] Kussmaul, MD as Consulting Physician (General Surgery) [name] Gibson, MD as Attending Physician (Radiation Oncology) OTHER MD:  CHIEF COMPLAINT: Inflammatory breast cancer  CURRENT TREATMENT: Neoadjuvant chemotherapy   HISTORY OF CURRENT ILLNESS: [name] [name] presented with right breast discoloration and redness. The right breast also had an area of hardness and nipple retraction.  She did not bring this to medical attention but her daughter did mention it when the patient had neurologic follow-up on 08/10/2018.  At that time the nurse practitioner, [name] [name], noted right nipple inversion, hard breast, and red non-ulcerated lesions on the breast.  The patient then underwent bilateral diagnostic mammography with tomography and right breast ultrasonography at [facility] on 08/11/2018 (this is her first ever mammogram) showing: breast density category B. There was a round 2.5 cm mass at the 1 o'clock upper inner quadrant of the right breast.  By palpation this measured approximately 10 cm.  Sonographically the irregular hypoechoic mass measured 2.7 cm. There was also a hypoechoic mass in the right breast at 9 o'clock upper outer quadrant measuring 3.8 cm. There was an abnormal right axillary lymph node with cortical thickening.   Accordingly on 08/19/2018 she proceeded to biopsy of  the right breast areas in question. The pathology from this procedure showed [id number]): At the 1 o'clock: invasive lobular carcinoma, grade II. Prognostic indicators significant for: estrogen receptor, 60% positive with moderate staining intensity and progesterone receptor, 0% negative. Proliferation marker Ki67 at 20%. HER2 negative with an immunohistochemistry of (1+).  At the 9 o'clock: invasive lobular carcinoma, grade II. Prognostic indicators significant for: estrogen receptor, 90% positive with strong staining intensity and progesterone receptor, 0% negative. Proliferation marker Ki67 at 30%. HER2 negative with an immunohistochemistry of (1+).  The patient's subsequent history is as detailed below.  INTERVAL HISTORY: [name] [name] returns today for further evaluation and treatment of her locally advanced breast cancer, accompanied by her daughter.  She is cycle 1 day 8 after receiving her first neoadjuvant treatments with [facility].  She tolerated this well and says that she hasn't noted any troublesome side effects.  She has been constipated however that resolved, her last bowel movement was this morning.     REVIEW OF SYSTEMS: [name] [name] is feeling well today.  She notes that her appetite is unchanged.  She has intermittent fatigue, however it does not interfere with her normal activities.  She does feel like her breast is unchanged.    PAST MEDICAL HISTORY: Past Medical History:  Diagnosis Date  . Anxiety   . Cancer [facility])    breast cancer- right  . Carotid stenosis   . Depression    situational  . Dyspnea    with much ambulation  . GERD (gastroesophageal reflux disease)   . Hypertension   . Stroke ([place]) 04/01/2017   a little weakness right side leg and hand, a little memry loss    PAST SURGICAL HISTORY: Past Surgical History:  Procedure Laterality Date  . ABDOMINAL HYSTERECTOMY    .  ENDARTERECTOMY Left 04/08/2017   Procedure: ENDARTERECTOMY CAROTID;  Surgeon: [name] Sandy, MD;  Location: [place];  Service: Vascular;  Laterality: Left;  . PATCH ANGIOPLASTY Left 04/08/2017   Procedure: PATCH ANGIOPLASTY Left Carotid;  Surgeon: [name] Sandy, MD;  Location: [place];  Service: Vascular;  Laterality: Left;  . PORTACATH PLACEMENT Left 09/07/2018   Procedure: INSERTION PORT-A-CATH LEFT INTERNAL JUGULAR;  Surgeon: [name] Kussmaul, MD;  Location: [place];  Service: General;  Laterality: Left;  total hysterectomy with bilateral salpingo-oophorectomy in her 77's no hormone replacement  FAMILY HISTORY Family History  Problem Relation Age of Onset  . Cancer Mother        ovarian  The patient's father is alive at age 4. The patient's mother died at age 35 due to ovarian cancer. The patient had 3 brothers and 3 sisters. She denies a history of breast, pancreatic, colon, or other cancers in the family.   GYNECOLOGIC HISTORY:  No LMP recorded. Patient has had a hysterectomy. Menarche: Age at first live birth: 72 years old She is GX P1.  She is status post total hysterectomy with BSO in her early 25's. She did not use HRT.    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. She is divorced. At home is just herself, but her two grandsons regularly come to visit and stay with her. The patient's daughter, [name] [name] works in administration for [employer]. The patient's 50 y.o. grandson is a Freight forwarder for [employer]. The patient's 51 y.o. grandson works in [employer].     ADVANCED DIRECTIVES: Not in place.  At the 08/26/2018 visit the patient was given the appropriate documents to complete and notarized at her discretion.   HEALTH MAINTENANCE: Social History   Tobacco Use  . Smoking status: Never Smoker  . Smokeless tobacco: Never Used  Substance Use Topics  . Alcohol use: No  . Drug use: No     Colonoscopy: Never  PAP: Status post hysterectomy  Bone density: Never   No Known Allergies  Current Outpatient Medications    Medication Sig Dispense Refill  . amLODipine (NORVASC) 10 MG tablet Take 1 tablet (10 mg total) by mouth daily. PATIENT MUST HAVE OFFICE VISIT AND LABS PRIOR TO ANY FURTHER REFILLS 15 tablet 0  . atorvastatin (LIPITOR) 80 MG tablet Take 1 tablet (80 mg total) by mouth daily at 6 PM. 30 tablet 0  . clopidogrel (PLAVIX) 75 MG tablet TAKE 1 TABLET BY MOUTH EVERY DAY 30 tablet 1  . lidocaine-prilocaine (EMLA) cream Apply to affected area once (Patient taking differently: Apply 1 application topically daily as needed (port access). Apply to affected area once) 30 g 3  . lisinopril-hydrochlorothiazide (PRINZIDE,ZESTORETIC) 20-12.5 MG tablet Take 1 tablet by mouth daily.  11  . loratadine (CLARITIN) 10 MG tablet Take 10 mg by mouth daily as needed for allergies.    . pantoprazole (PROTONIX) 40 MG tablet TAKE 1 TABLET BY MOUTH EVERY DAY 30 tablet 1  . prochlorperazine (COMPAZINE) 10 MG tablet Take 1 tablet (10 mg total) by mouth every 6 (six) hours as needed (Nausea or vomiting). 30 tablet 1  . traZODone (DESYREL) 50 MG tablet Take 0.5-1 tablets (25-50 mg total) by mouth at bedtime as needed for sleep. (Patient taking differently: Take 50 mg by mouth at bedtime as needed for sleep. ) 15 tablet 0   No current facility-administered medications for this visit.     OBJECTIVE:  There were  no vitals filed for this visit.   There is no height or weight on file to calculate BMI.   Wt Readings from Last 3 Encounters:  09/23/18 168 lb 3.2 oz (76.3 kg)  09/15/18 170 lb 6.4 oz (77.3 kg)  09/07/18 170 lb (77.1 kg)      ECOG FS:2 - Symptomatic, <50% confined to bed  GENERAL: Patient is a well appearing female in no acute distress HEENT:  Sclerae anicteric.  Oropharynx clear and moist. No ulcerations or evidence of oropharyngeal candidiasis. Neck is supple.  NODES:  No cervical, supraclavicular, or axillary lymphadenopathy palpated.  BREAST EXAM:  Deferred. LUNGS:  Clear to auscultation bilaterally.  No  wheezes or rhonchi. HEART:  Regular rate and rhythm. No murmur appreciated. ABDOMEN:  Soft, nontender.  Positive, normoactive bowel sounds. No organomegaly palpated. MSK:  No focal spinal tenderness to palpation. Full range of motion bilaterally in the upper extremities. EXTREMITIES:  No peripheral edema.   SKIN:  Clear with no obvious rashes or skin changes. No nail dyscrasia. NEURO:  Nonfocal. Well oriented.  Appropriate affect.     Right Breast 08/26/2018        LAB RESULTS:  CMP     Component Value Date/Time   NA 142 09/23/2018 0952   K 3.3 (L) 09/23/2018 0952   CL 105 09/23/2018 0952   CO2 29 09/23/2018 0952   GLUCOSE 109 (H) 09/23/2018 0952   BUN 17 09/23/2018 0952   CREATININE 0.94 09/23/2018 0952   CREATININE 1.08 (H) 08/26/2018 1235   CALCIUM 8.9 09/23/2018 0952   PROT 7.4 09/23/2018 0952   ALBUMIN 3.8 09/23/2018 0952   AST 12 (L) 09/23/2018 0952   AST 15 08/26/2018 1235   ALT 15 09/23/2018 0952   ALT 20 08/26/2018 1235   ALKPHOS 84 09/23/2018 0952   BILITOT 0.3 09/23/2018 0952   BILITOT 0.4 08/26/2018 1235   GFRNONAA >60 09/23/2018 0952   GFRNONAA 51 (L) 08/26/2018 1235   GFRAA >60 09/23/2018 0952   GFRAA 59 (L) 08/26/2018 1235    No results found for: TOTALPROTELP, ALBUMINELP, A1GS, A2GS, BETS, BETA2SER, GAMS, MSPIKE, SPEI  No results found for: KPAFRELGTCHN, LAMBDASER, KAPLAMBRATIO  Lab Results  Component Value Date   WBC 3.6 (L) 09/23/2018   NEUTROABS 2.0 09/23/2018   HGB 11.4 (L) 09/23/2018   HCT 36.8 09/23/2018   MCV 81.6 09/23/2018   PLT 190 09/23/2018    _0 @  No results found for: LABCA2  No components found for: RWERXV400  No results for input(s): INR in the last 168 hours.  No results found for: LABCA2  No results found for: CAN199  No results found for: QQP619  No results found for: JKD326  Lab Results  Component Value Date   CA2729 54.4 (H) 09/15/2018    No components found for: HGQUANT  No results  found for: CEA1 / No results found for: CEA1   No results found for: AFPTUMOR  No results found for: [place]  No results found for: PSA1  Appointment on 09/23/2018  Component Date Value Ref Range Status  . Sodium 09/23/2018 142  135 - 145 mmol/L Final  . Potassium 09/23/2018 3.3* 3.5 - 5.1 mmol/L Final  . Chloride 09/23/2018 105  98 - 111 mmol/L Final  . CO2 09/23/2018 29  22 - 32 mmol/L Final  . Glucose, Bld 09/23/2018 109* 70 - 99 mg/dL Final  . BUN 09/23/2018 17  8 - 23 mg/dL Final  . Creatinine, Ser 09/23/2018 0.94  0.44 -  1.00 mg/dL Final  . Calcium 09/23/2018 8.9  8.9 - 10.3 mg/dL Final  . Total Protein 09/23/2018 7.4  6.5 - 8.1 g/dL Final  . Albumin 09/23/2018 3.8  3.5 - 5.0 g/dL Final  . AST 09/23/2018 12* 15 - 41 U/L Final  . ALT 09/23/2018 15  0 - 44 U/L Final  . Alkaline Phosphatase 09/23/2018 84  38 - 126 U/L Final  . Total Bilirubin 09/23/2018 0.3  0.3 - 1.2 mg/dL Final  . GFR calc non Af Amer 09/23/2018 >60  >60 mL/min Final  . GFR calc Af Amer 09/23/2018 >60  >60 mL/min Final   Comment: (NOTE) The eGFR has been calculated using the CKD EPI equation. This calculation has not been validated in all clinical situations. eGFR's persistently <60 mL/min signify possible Chronic Kidney Disease.   Georgiann Hahn gap 09/23/2018 8  5 - 15 Final   Performed at [facility], [place] [address] [place], [place] [address]  . WBC 09/23/2018 3.6* 4.0 - 10.5 K/uL Final  . RBC 09/23/2018 4.51  3.87 - 5.11 MIL/uL Final  . Hemoglobin 09/23/2018 11.4* 12.0 - 15.0 g/dL Final  . HCT 09/23/2018 36.8  36.0 - 46.0 % Final  . MCV 09/23/2018 81.6  80.0 - 100.0 fL Final  . MCH 09/23/2018 25.3* 26.0 - 34.0 pg Final  . MCHC 09/23/2018 31.0  30.0 - 36.0 g/dL Final  . RDW 09/23/2018 13.6  11.5 - 15.5 % Final  . Platelets 09/23/2018 190  150 - 400 K/uL Final  . nRBC 09/23/2018 0.0  0.0 - 0.2 % Final  . Neutrophils Relative % 09/23/2018 55  % Final  . Neutro Abs 09/23/2018  2.0  1.7 - 7.7 K/uL Final  . Lymphocytes Relative 09/23/2018 34  % Final  . Lymphs Abs 09/23/2018 1.2  0.7 - 4.0 K/uL Final  . Monocytes Relative 09/23/2018 5  % Final  . Monocytes Absolute 09/23/2018 0.2  0.1 - 1.0 K/uL Final  . Eosinophils Relative 09/23/2018 5  % Final  . Eosinophils Absolute 09/23/2018 0.2  0.0 - 0.5 K/uL Final  . Basophils Relative 09/23/2018 1  % Final  . Basophils Absolute 09/23/2018 0.0  0.0 - 0.1 K/uL Final  . Immature Granulocytes 09/23/2018 0  % Final  . Abs Immature Granulocytes 09/23/2018 0.01  0.00 - 0.07 K/uL Final   Performed at [facility], [name] [place], [place] [address]    (this displays the last labs from the last 3 days)  No results found for: TOTALPROTELP, ALBUMINELP, A1GS, A2GS, BETS, BETA2SER, GAMS, MSPIKE, SPEI (this displays SPEP labs)  No results found for: KPAFRELGTCHN, LAMBDASER, KAPLAMBRATIO (kappa/lambda light chains)  No results found for: HGBA, HGBA2QUANT, HGBFQUANT, HGBSQUAN (Hemoglobinopathy evaluation)   No results found for: LDH  No results found for: IRON, TIBC, IRONPCTSAT (Iron and TIBC)  No results found for: FERRITIN  Urinalysis    Component Value Date/Time   COLORURINE AMBER (A) 04/01/2017 0920   APPEARANCEUR CLOUDY (A) 04/01/2017 0920   LABSPEC 1.021 04/01/2017 0920   PHURINE 5.0 04/01/2017 0920   GLUCOSEU NEGATIVE 04/01/2017 0920   HGBUR MODERATE (A) 04/01/2017 0920   BILIRUBINUR NEGATIVE 04/01/2017 0920   KETONESUR 80 (A) 04/01/2017 0920   PROTEINUR NEGATIVE 04/01/2017 0920   NITRITE NEGATIVE 04/01/2017 0920   LEUKOCYTESUR LARGE (A) 04/01/2017 0920     STUDIES: Ct Chest W Contrast  Result Date: 09/02/2018 CLINICAL DATA:  Right breast cancer staging workup EXAM: CT CHEST,  ABDOMEN, AND PELVIS WITH CONTRAST TECHNIQUE: Multidetector CT imaging of the chest, abdomen and pelvis was performed following the standard protocol during bolus administration of intravenous  contrast. CONTRAST:  160m OMNIPAQUE IOHEXOL 300 MG/ML  SOLN COMPARISON:  Brachial plexus MRI from 04/01/2017 FINDINGS: CT CHEST FINDINGS Cardiovascular: Atherosclerotic calcification of the aortic arch, left anterior descending coronary artery, right coronary artery, and circumflex coronary artery. Mediastinum/Nodes: Mostly Zentz right axillary and subpectoral lymph nodes including a 7 mm right axillary node on image 23/2. Lungs/Pleura: Biapical pleuroparenchymal scarring. Cylindrical bronchiectasis in both lower lobes. 5 mm ground-glass density nodule in the left upper lobe, image 40/5. 0.5 by 0.3 cm sub solid left upper lobe nodule, image 61/5. Scarring or atelectasis in the left lower lobe near the hemidiaphragm. Musculoskeletal: Mild thoracic kyphosis and spondylosis. I do not observe compelling evidence of active osseous metastatic disease. There is abnormal asymmetric density and enhancement throughout the glandular tissues of the right breast but especially in the upper breast extending to the cutaneous surface, and also extending to the superficial fascial surface of the pectoralis major muscle, suspicious for inflammatory breast cancer. CT ABDOMEN PELVIS FINDINGS Hepatobiliary: Unremarkable Pancreas: Unremarkable Spleen: Unremarkable Adrenals/Urinary Tract: 3 mm right kidney lower pole nonobstructive renal calculus. 4 mm left kidney lower pole nonobstructive renal calculus. No hydronephrosis or hydroureter. Adrenal glands normal. Stomach/Bowel: Unremarkable Vascular/Lymphatic: Aortoiliac atherosclerotic vascular disease. Reproductive: Uterus surgically absent.  Adnexa unremarkable. Other: Indistinct hazy appearance of the central mesentery. No ascites. Musculoskeletal: Grade 1 degenerative anterolisthesis at L4-5. Lumbar degenerative disc disease. Degenerative facet arthropathy in the lower lumbar spine. IMPRESSION: 1. Abnormal asymmetric density throughout the right breast extending to the cutaneous  surface and the superficial fascia margin of the pectoralis major, compatible with the patient's diagnosis of inflammatory breast cancer. Bober right axillary and subpectoral lymph nodes are not pathologically enlarged and no compelling findings of distant metastatic spread are identified currently. 2. 5 mm ground-glass density nodule in the left upper lobe and separate 4 mm sub solid left upper lobe nodule. The sub solid appearance would be unusual for metastatic lesion but low-grade adenocarcinoma is not completely excluded. No follow-up needed if patient is low-risk (and has no known or suspected primary neoplasm). I would suggest follow up chest CT within the next 12 months. 3. Single bilateral nonobstructive renal calculi. 4. Aortic Atherosclerosis (ICD10-I70.0). Notable coronary atherosclerosis. 5. Lumbar spondylosis and degenerative disc disease. 6. Hazy appearance of the central mesentery. 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 Electronically Signed   By: WVan ClinesM.D.   On: 09/02/2018 12:03   Nm Bone Scan Whole Body  Result Date: 09/10/2018 CLINICAL DATA:  Right breast cancer EXAM: NUCLEAR MEDICINE WHOLE BODY BONE SCAN TECHNIQUE: Whole body anterior and posterior images were obtained approximately 3 hours after intravenous injection of radiopharmaceutical. RADIOPHARMACEUTICALS:  19.9 mCi Technetium-955mDP IV COMPARISON:  CT scan September 01, 2018 FINDINGS: Degenerative changes in the knees, left first MTP joint, and spine. No other significant bony abnormalities. Increased soft tissue uptake in the right breast, consistent with known malignancy. No other acute abnormalities identified. IMPRESSION: No bony metastatic disease noted. Electronically Signed   By: DaDorise BullionII M.D   On: 09/10/2018 15:32   Ct Abdomen Pelvis W Contrast  Result Date: 09/02/2018 CLINICAL DATA:  Right breast cancer  staging workup EXAM: CT CHEST, ABDOMEN, AND PELVIS WITH CONTRAST TECHNIQUE: Multidetector CT imaging of the chest, abdomen and pelvis was  performed following the standard protocol during bolus administration of intravenous contrast. CONTRAST:  160m OMNIPAQUE IOHEXOL 300 MG/ML  SOLN COMPARISON:  Brachial plexus MRI from 04/01/2017 FINDINGS: CT CHEST FINDINGS Cardiovascular: Atherosclerotic calcification of the aortic arch, left anterior descending coronary artery, right coronary artery, and circumflex coronary artery. Mediastinum/Nodes: Mostly Sobel right axillary and subpectoral lymph nodes including a 7 mm right axillary node on image 23/2. Lungs/Pleura: Biapical pleuroparenchymal scarring. Cylindrical bronchiectasis in both lower lobes. 5 mm ground-glass density nodule in the left upper lobe, image 40/5. 0.5 by 0.3 cm sub solid left upper lobe nodule, image 61/5. Scarring or atelectasis in the left lower lobe near the hemidiaphragm. Musculoskeletal: Mild thoracic kyphosis and spondylosis. I do not observe compelling evidence of active osseous metastatic disease. There is abnormal asymmetric density and enhancement throughout the glandular tissues of the right breast but especially in the upper breast extending to the cutaneous surface, and also extending to the superficial fascial surface of the pectoralis major muscle, suspicious for inflammatory breast cancer. CT ABDOMEN PELVIS FINDINGS Hepatobiliary: Unremarkable Pancreas: Unremarkable Spleen: Unremarkable Adrenals/Urinary Tract: 3 mm right kidney lower pole nonobstructive renal calculus. 4 mm left kidney lower pole nonobstructive renal calculus. No hydronephrosis or hydroureter. Adrenal glands normal. Stomach/Bowel: Unremarkable Vascular/Lymphatic: Aortoiliac atherosclerotic vascular disease. Reproductive: Uterus surgically absent.  Adnexa unremarkable. Other: Indistinct hazy appearance of the central mesentery. No ascites. Musculoskeletal: Grade 1  degenerative anterolisthesis at L4-5. Lumbar degenerative disc disease. Degenerative facet arthropathy in the lower lumbar spine. IMPRESSION: 1. Abnormal asymmetric density throughout the right breast extending to the cutaneous surface and the superficial fascia margin of the pectoralis major, compatible with the patient's diagnosis of inflammatory breast cancer. Glorioso right axillary and subpectoral lymph nodes are not pathologically enlarged and no compelling findings of distant metastatic spread are identified currently. 2. 5 mm ground-glass density nodule in the left upper lobe and separate 4 mm sub solid left upper lobe nodule. The sub solid appearance would be unusual for metastatic lesion but low-grade adenocarcinoma is not completely excluded. No follow-up needed if patient is low-risk (and has no known or suspected primary neoplasm). I would suggest follow up chest CT within the next 12 months. 3. Single bilateral nonobstructive renal calculi. 4. Aortic Atherosclerosis (ICD10-I70.0). Notable coronary atherosclerosis. 5. Lumbar spondylosis and degenerative disc disease. 6. Hazy appearance of the central mesentery. 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 Electronically Signed   By: WVan ClinesM.D.   On: 09/02/2018 12:03   Mr Breast Bilateral W Wo Contrast Inc Cad  Result Date: 09/14/2018 CLINICAL DATA:  [age]year old female with recently diagnosed inflammatory right breast cancer post ultrasound-guided biopsy masses at the 1 o'clock and 9 o'clock positions. Possible abnormal lymph node was identified however due to deep location was not biopsied. LABS:  Not applicable. EXAM: BILATERAL BREAST MRI WITH AND WITHOUT CONTRAST TECHNIQUE: Multiplanar, multisequence MR images of both breasts were obtained prior to and following the intravenous administration of 8 ml of Gadavist Three-dimensional MR images were rendered  by post-processing of the original MR data on an independent workstation. The three-dimensional MR images were interpreted, and findings are reported in the following complete MRI report for this study. Three dimensional images were evaluated at the independent DynaCad workstation COMPARISON:  Previous exam(s). FINDINGS: Breast composition: c.  Heterogeneous fibroglandular tissue. Background parenchymal enhancement: Moderate. Right breast: There is right breast and nipple retraction with associated skin and trabecular thickening. Biopsy clip artifact  is present in the upper inner and periareolar/slightly outer right breast at sites of biopsy proven malignancy. There is abnormal masslike enhancement of nearly the entire upper right breast extending to the skin and extending inferiorly to involve the central to lower outer quadrant, in entirety measuring at least 12.6 x 12.4 x 11 cm. This mass appears to infiltrate into the pectoralis muscle of the upper inner right breast (subtraction image 48). Left breast: No mass or abnormal enhancement. Lymph nodes: No definite morphologically abnormal lymph nodes. Ancillary findings:  None. IMPRESSION: 1. Right breast malignancy involving a large portion of the right breast, predominantly the entire upper right breast and extending inferiorly to involve the central to lower outer quadrant, and in entirety measuring at least 12.6 x 12.4 x 11 cm (although difficult to place a true measurement on this large mass). The mass extends to involve the skin of the anterior right breast and extends to and involves the pectoralis muscle. 2.  No definite right axillary lymphadenopathy. 3.  No MRI evidence of malignancy in the left breast. RECOMMENDATION: Treatment plan for known right breast malignancy. BI-RADS CATEGORY  6: Known biopsy-proven malignancy. Electronically Signed   By: Everlean Alstrom M.D.   On: 09/14/2018 15:42   Dg Chest Port 1 View  Result Date: 09/07/2018 CLINICAL  DATA:  Port-A-Cath placement.  Breast cancer EXAM: PORTABLE CHEST 1 VIEW COMPARISON:  CT chest 09/01/2018 FINDINGS: Left jugular Port-A-Cath tip in the mid SVC.  No pneumothorax Heart size upper normal. Mild atelectasis in the bases. Negative for infiltrate or effusion. IMPRESSION: Satisfactory Port-A-Cath placement. Electronically Signed   By: Franchot Gallo M.D.   On: 09/07/2018 14:09   Dg Fluoro Guide Cv Line-no Report  Result Date: 09/07/2018 Fluoroscopy was utilized by the requesting physician.  No radiographic interpretation.     ELIGIBLE FOR AVAILABLE RESEARCH PROTOCOL: no  ASSESSMENT: 72 y.o. [place], [place] woman status post right breast biopsy of overlapping sites on 08/19/2018 for a clinical T4 N1, stage IIIB invasive lobular carcinoma, estrogen receptor positive, progesterone receptor negative, HER-2 not amplified, with an MIB-1 of 20%.  (1) genetics testing pending  (2) neoadjuvant chemotherapy to consist of cyclophosphamide, methotrexate and fluorouracil every 21 days x 8, to begin 09/16/2018  (3) definitive surgery to follow  (4) adjuvant radiation  (5) antiestrogens therapy at the completion of local treatment  (6) foundation 1/PD-L1 testing  PLAN:   I urged him to call us with any problems that may develop before the next visit.   Wilber Bihari, NP  09/23/18 10:53 AM Medical Oncology and Hematology [facility] [address] [place], [place] [address] Tel. [phone]    Fax. [phone]

## 2018-09-23 NOTE — Progress Notes (Signed)
Right breast [phone]

## 2018-09-25 ENCOUNTER — Telehealth: Payer: Self-pay | Admitting: General Practice

## 2018-09-25 ENCOUNTER — Encounter: Payer: Self-pay | Admitting: *Deleted

## 2018-09-25 NOTE — Progress Notes (Signed)
[place] Work  Clinical Social Work received voicemail from patient's daughter, Wyman Songster, requesting information on obtaining legal documentation for POA and living will.  Patient's daughter would like durable POA/living will completed in addition to healthcare directives.  CSW explained [facility] CSW team is only able to assist with healthcare advance directives.  Ms. Caryn Section was interested in recommended law offices or offices that may offer services at a reduced rate to cancer patients.  CSW explained [facility] is unable to make such recommendations.  CSW encouraged patient/daughter to call CSW team with any other questions or concerns.  Gwinda Maine, LCSW  Clinical Social Worker [facility]

## 2018-09-25 NOTE — Telephone Encounter (Signed)
[facility] CSW Progress Notes  Call from daughter, Wyman Songster.  Requests referral to lawyer who can assist w preparation of will, wants low cost service if possible. Spoke w daughter, gave information on  w [facility] referral program, advised daughter to call and discuss needs.  Also gave name of [place] [phone]) for additional options.  Advised that CSWs and chaplain can assist w HCPOA and Living Will documents.  Edwyna Shell, LCSW Clinical Social Worker Phone:  [phone]

## 2018-10-06 ENCOUNTER — Ambulatory Visit: Payer: Medicare Other

## 2018-10-06 ENCOUNTER — Other Ambulatory Visit: Payer: Medicare Other

## 2018-10-06 ENCOUNTER — Ambulatory Visit: Payer: Medicare Other | Admitting: Adult Health

## 2018-10-09 ENCOUNTER — Inpatient Hospital Stay: Payer: Medicare Other

## 2018-10-09 ENCOUNTER — Ambulatory Visit: Payer: Medicare Other

## 2018-10-09 ENCOUNTER — Encounter: Payer: Self-pay | Admitting: Oncology

## 2018-10-09 ENCOUNTER — Other Ambulatory Visit: Payer: Self-pay | Admitting: Hematology

## 2018-10-09 ENCOUNTER — Inpatient Hospital Stay (HOSPITAL_BASED_OUTPATIENT_CLINIC_OR_DEPARTMENT_OTHER): Payer: Medicare Other | Admitting: Oncology

## 2018-10-09 DIAGNOSIS — Z170 Estrogen receptor positive status [ER+]: Principal | ICD-10-CM

## 2018-10-09 DIAGNOSIS — C50911 Malignant neoplasm of unspecified site of right female breast: Secondary | ICD-10-CM

## 2018-10-09 DIAGNOSIS — R911 Solitary pulmonary nodule: Secondary | ICD-10-CM | POA: Diagnosis not present

## 2018-10-09 DIAGNOSIS — I63132 Cerebral infarction due to embolism of left carotid artery: Secondary | ICD-10-CM

## 2018-10-09 DIAGNOSIS — C50811 Malignant neoplasm of overlapping sites of right female breast: Secondary | ICD-10-CM

## 2018-10-09 DIAGNOSIS — Z95828 Presence of other vascular implants and grafts: Secondary | ICD-10-CM

## 2018-10-09 DIAGNOSIS — Z8673 Personal history of transient ischemic attack (TIA), and cerebral infarction without residual deficits: Secondary | ICD-10-CM | POA: Diagnosis not present

## 2018-10-09 DIAGNOSIS — Z79899 Other long term (current) drug therapy: Secondary | ICD-10-CM | POA: Diagnosis not present

## 2018-10-09 DIAGNOSIS — Z5111 Encounter for antineoplastic chemotherapy: Secondary | ICD-10-CM | POA: Diagnosis not present

## 2018-10-09 DIAGNOSIS — I69359 Hemiplegia and hemiparesis following cerebral infarction affecting unspecified side: Secondary | ICD-10-CM

## 2018-10-09 LAB — COMPREHENSIVE METABOLIC PANEL
ALT: 15 U/L (ref 0–44)
ANION GAP: 9 (ref 5–15)
AST: 14 U/L — ABNORMAL LOW (ref 15–41)
Albumin: 3.7 g/dL (ref 3.5–5.0)
Alkaline Phosphatase: 102 U/L (ref 38–126)
BILIRUBIN TOTAL: 0.4 mg/dL (ref 0.3–1.2)
BUN: 14 mg/dL (ref 8–23)
CHLORIDE: 107 mmol/L (ref 98–111)
CO2: 27 mmol/L (ref 22–32)
Calcium: 9.2 mg/dL (ref 8.9–10.3)
Creatinine, Ser: 0.82 mg/dL (ref 0.44–1.00)
GFR calc Af Amer: >60 mL/min (ref >60)
GFR calc non Af Amer: >60 mL/min (ref >60)
GLUCOSE: 116 mg/dL — AB (ref 70–99)
POTASSIUM: 3.5 mmol/L (ref 3.5–5.1)
Sodium: 143 mmol/L (ref 135–145)
TOTAL PROTEIN: 7.1 g/dL (ref 6.5–8.1)

## 2018-10-09 LAB — CBC WITH DIFFERENTIAL/PLATELET
ABS IMMATURE GRANULOCYTES: 0.04 10*3/uL (ref 0.00–0.07)
Basophils Absolute: 0 10*3/uL (ref 0.0–0.1)
Basophils Relative: 1 %
Eosinophils Absolute: 0.1 10*3/uL (ref 0.0–0.5)
Eosinophils Relative: 3 %
HCT: 34.6 % — ABNORMAL LOW (ref 36.0–46.0)
Hemoglobin: 10.7 g/dL — ABNORMAL LOW (ref 12.0–15.0)
IMMATURE GRANULOCYTES: 1 %
LYMPHS PCT: 35 %
Lymphs Abs: 1.3 10*3/uL (ref 0.7–4.0)
MCH: 25.2 pg — ABNORMAL LOW (ref 26.0–34.0)
MCHC: 30.9 g/dL (ref 30.0–36.0)
MCV: 81.4 fL (ref 80.0–100.0)
MONOS PCT: 18 %
Monocytes Absolute: 0.7 10*3/uL (ref 0.1–1.0)
NEUTROS ABS: 1.5 10*3/uL — AB (ref 1.7–7.7)
NEUTROS PCT: 42 %
Platelets: 194 10*3/uL (ref 150–400)
RBC: 4.25 MIL/uL (ref 3.87–5.11)
RDW: 14.4 % (ref 11.5–15.5)
WBC: 3.6 10*3/uL — AB (ref 4.0–10.5)
nRBC: 0 % (ref 0.0–0.2)

## 2018-10-09 MED ORDER — DEXAMETHASONE SODIUM PHOSPHATE 10 MG/ML IJ SOLN
INTRAMUSCULAR | Status: AC
  Filled 2018-10-09: qty 1

## 2018-10-09 MED ORDER — PALONOSETRON HCL INJECTION 0.25 MG/5ML
0.2500 mg | Freq: Once | INTRAVENOUS | Status: AC
  Administered 2018-10-09: 0.25 mg via INTRAVENOUS

## 2018-10-09 MED ORDER — SODIUM CHLORIDE 0.9% FLUSH
10.0000 mL | INTRAVENOUS | Status: DC | PRN
  Administered 2018-10-09: 10 mL
  Filled 2018-10-09: qty 10

## 2018-10-09 MED ORDER — HEPARIN SOD (PORK) LOCK FLUSH 100 UNIT/ML IV SOLN
500.0000 [IU] | Freq: Once | INTRAVENOUS | Status: AC | PRN
  Administered 2018-10-09: 500 [IU]
  Filled 2018-10-09: qty 5

## 2018-10-09 MED ORDER — SODIUM CHLORIDE 0.9 % IV SOLN
600.0000 mg/m2 | Freq: Once | INTRAVENOUS | Status: AC
  Administered 2018-10-09: 1140 mg via INTRAVENOUS
  Filled 2018-10-09: qty 57

## 2018-10-09 MED ORDER — LIDOCAINE-PRILOCAINE 2.5-2.5 % EX CREA: TOPICAL_CREAM | CUTANEOUS | 3 refills | Status: DC

## 2018-10-09 MED ORDER — PALONOSETRON HCL INJECTION 0.25 MG/5ML
INTRAVENOUS | Status: AC
  Filled 2018-10-09: qty 5

## 2018-10-09 MED ORDER — SODIUM CHLORIDE 0.9 % IV SOLN
Freq: Once | INTRAVENOUS | Status: AC
  Administered 2018-10-09: 14:00:00 via INTRAVENOUS
  Filled 2018-10-09: qty 250

## 2018-10-09 MED ORDER — FLUOROURACIL CHEMO INJECTION 2.5 GM/50ML
600.0000 mg/m2 | Freq: Once | INTRAVENOUS | Status: AC
  Administered 2018-10-09: 1150 mg via INTRAVENOUS
  Filled 2018-10-09: qty 23

## 2018-10-09 MED ORDER — DEXAMETHASONE SODIUM PHOSPHATE 10 MG/ML IJ SOLN
10.0000 mg | Freq: Once | INTRAMUSCULAR | Status: AC
  Administered 2018-10-09: 10 mg via INTRAVENOUS

## 2018-10-09 MED ORDER — METHOTREXATE SODIUM (PF) CHEMO INJECTION 250 MG/10ML
39.7500 mg/m2 | Freq: Once | INTRAMUSCULAR | Status: AC
  Administered 2018-10-09: 76 mg via INTRAVENOUS
  Filled 2018-10-09: qty 3.04

## 2018-10-09 MED FILL — LIDOCAINE-PRILOCAINE CREAM: 2.5-2.5 | 30 days supply | Qty: 30 | Fill #0

## 2018-10-09 NOTE — Progress Notes (Signed)
[name] OFFICE PROGRESS NOTE  [name] Carol, MD [address] [place] [address]    Malignant neoplasm of overlapping sites of right breast in female, estrogen receptor positive ([name] [name])   08/24/2018 Initial Diagnosis    Malignant neoplasm of overlapping sites of right breast in female, estrogen receptor positive ([name] [name])    09/07/2018 -  Chemotherapy    The patient had palonosetron (ALOXI) injection 0.25 mg, 0.25 mg, Intravenous,  Once, 2 of 6 cycles Administration: 0.25 mg (09/16/2018) methotrexate (PF) chemo injection 76 mg, 39.8 mg/m2 = 76.5 mg, Intravenous,  Once, 2 of 6 cycles Administration: 76 mg (09/16/2018) cyclophosphamide (CYTOXAN) 1,140 mg in sodium chloride 0.9 % 250 mL chemo infusion, 600 mg/m2 = 1,140 mg, Intravenous,  Once, 2 of 6 cycles Administration: 1,140 mg (09/16/2018) fluorouracil (ADRUCIL) chemo injection 1,150 mg, 600 mg/m2 = 1,150 mg, Intravenous,  Once, 2 of 6 cycles Administration: 1,150 mg (09/16/2018)  for chemotherapy treatment.      Inflammatory breast cancer, right ([name] [name])   08/26/2018 Initial Diagnosis    Inflammatory breast cancer, right ([name] [name])    09/07/2018 -  Chemotherapy    The patient had palonosetron (ALOXI) injection 0.25 mg, 0.25 mg, Intravenous,  Once, 2 of 6 cycles Administration: 0.25 mg (09/16/2018) methotrexate (PF) chemo injection 76 mg, 39.8 mg/m2 = 76.5 mg, Intravenous,  Once, 2 of 6 cycles Administration: 76 mg (09/16/2018) cyclophosphamide (CYTOXAN) 1,140 mg in sodium chloride 0.9 % 250 mL chemo infusion, 600 mg/m2 = 1,140 mg, Intravenous,  Once, 2 of 6 cycles Administration: 1,140 mg (09/16/2018) fluorouracil (ADRUCIL) chemo injection 1,150 mg, 600 mg/m2 = 1,150 mg, Intravenous,  Once, 2 of 6 cycles Administration: 1,150 mg (09/16/2018)  for chemotherapy treatment.      CURRENT THERAPY: Neoadjuvant CMF status post 1 cycle.  INTERVAL HISTORY: [name] [name] 72 y.o. female returns for routine follow-up  visit accompanied by her family members.  The patient is feeling fine today and has no specific complaints.  She tolerated her first cycle of chemotherapy well overall.  Denies fevers and chills.  Denies chest pain, shortness of breath, cough, hemoptysis.  Denies nausea, vomiting, constipation, diarrhea.  Denies recent weight loss or night sweats.  The patient is here for evaluation prior to cycle #2 of CMF.  MEDICAL HISTORY: Past Medical History:  Diagnosis Date  . Anxiety   . Cancer [name])    breast cancer- right  . Carotid stenosis   . Depression    situational  . Dyspnea    with much ambulation  . GERD (gastroesophageal reflux disease)   . Hypertension   . Stroke ([place]) 04/01/2017   a little weakness right side leg and hand, a little memry loss    ALLERGIES:  has No Known Allergies.  MEDICATIONS:  Current Outpatient Medications  Medication Sig Dispense Refill  . amLODipine (NORVASC) 10 MG tablet Take 1 tablet (10 mg total) by mouth daily. PATIENT MUST HAVE OFFICE VISIT AND LABS PRIOR TO ANY FURTHER REFILLS 15 tablet 0  . atorvastatin (LIPITOR) 80 MG tablet Take 1 tablet (80 mg total) by mouth daily at 6 PM. 30 tablet 0  . clopidogrel (PLAVIX) 75 MG tablet TAKE 1 TABLET BY MOUTH EVERY DAY 30 tablet 1  . lisinopril-hydrochlorothiazide (PRINZIDE,ZESTORETIC) 20-12.5 MG tablet Take 1 tablet by mouth daily.  11  . pantoprazole (PROTONIX) 40 MG tablet TAKE 1 TABLET BY MOUTH EVERY DAY 30 tablet 1  . prochlorperazine (COMPAZINE) 10 MG tablet Take 1 tablet (10  mg total) by mouth every 6 (six) hours as needed (Nausea or vomiting). 30 tablet 1  . lidocaine-prilocaine (EMLA) cream Apply to affected area once 30 g 3  . loratadine (CLARITIN) 10 MG tablet Take 10 mg by mouth daily as needed for allergies.    . traZODone (DESYREL) 50 MG tablet Take 0.5-1 tablets (25-50 mg total) by mouth at bedtime as needed for sleep. (Patient not taking: Reported on 10/09/2018) 15 tablet 0   No current  facility-administered medications for this visit.    Facility-Administered Medications Ordered in Other Visits  Medication Dose Route Frequency Provider Last Rate Last Dose  . cyclophosphamide (CYTOXAN) 1,140 mg in sodium chloride 0.9 % 250 mL chemo infusion  600 mg/m2 (Treatment Plan Recorded) Intravenous Once Brunetta Genera, MD 614 mL/hr at 10/09/18 1539 1,140 mg at 10/09/18 1539  . fluorouracil (ADRUCIL) chemo injection 1,150 mg  600 mg/m2 (Treatment Plan Recorded) Intravenous Once Brunetta Genera, MD      . heparin lock flush 100 unit/mL  500 Units Intracatheter Once PRN Brunetta Genera, MD      . methotrexate (PF) chemo injection 76 mg  39.75 mg/m2 (Treatment Plan Recorded) Intravenous Once Brunetta Genera, MD      . sodium chloride flush (NS) 0.9 % injection 10 mL  10 mL Intracatheter PRN Brunetta Genera, MD        SURGICAL HISTORY:  Past Surgical History:  Procedure Laterality Date  . ABDOMINAL HYSTERECTOMY    . ENDARTERECTOMY Left 04/08/2017   Procedure: ENDARTERECTOMY CAROTID;  Surgeon: Waynetta Sandy, MD;  Location: [facility];  Service: Vascular;  Laterality: Left;  . PATCH ANGIOPLASTY Left 04/08/2017   Procedure: PATCH ANGIOPLASTY Left Carotid;  Surgeon: Waynetta Sandy, MD;  Location: [facility];  Service: Vascular;  Laterality: Left;  . PORTACATH PLACEMENT Left 09/07/2018   Procedure: INSERTION PORT-A-CATH LEFT INTERNAL JUGULAR;  Surgeon: [name], MD;  Location: [facility];  Service: General;  Laterality: Left;    REVIEW OF SYSTEMS:   Review of Systems  Constitutional: Negative for appetite change, chills, fatigue, fever and unexpected weight change.  HENT:   Negative for mouth sores, nosebleeds, sore throat and trouble swallowing.   Eyes: Negative for eye problems and icterus.  Respiratory: Negative for cough, hemoptysis, shortness of breath and wheezing.   Cardiovascular: Negative for chest pain and leg swelling.  Gastrointestinal:  Negative for abdominal pain, constipation, diarrhea, nausea and vomiting.  Genitourinary: Negative for bladder incontinence, difficulty urinating, dysuria, frequency and hematuria.   Musculoskeletal: Negative for back pain, gait problem, neck pain and neck stiffness.  Skin: Negative for itching and rash.  Neurological: Negative for dizziness, extremity weakness, gait problem, headaches, light-headedness and seizures.  Hematological: Negative for adenopathy. Does not bruise/bleed easily.  Psychiatric/Behavioral: Negative for confusion, depression and sleep disturbance. The patient is not nervous/anxious.     PHYSICAL EXAMINATION:  Blood pressure (!) 147/66, pulse (!) 102, temperature 98.3 F (36.8 C), temperature source Oral, resp. rate 18, height 5\' 6"  (1.676 m), weight 170 lb 8 oz (77.3 kg), SpO2 100 %.  ECOG PERFORMANCE STATUS: 1 - Symptomatic but completely ambulatory  Physical Exam  Constitutional: Oriented to person, place, and time and well-developed, well-nourished, and in no distress. No distress.  HENT:  Head: Normocephalic and atraumatic.  Mouth/Throat: Oropharynx is clear and moist. No oropharyngeal exudate.  Eyes: Conjunctivae are normal. Right eye exhibits no discharge. Left eye exhibits no discharge. No scleral icterus.  Neck: Normal range of  motion. Neck supple.  Cardiovascular: Normal rate, regular rhythm, normal heart sounds and intact distal pulses.   Pulmonary/Chest: Effort normal and breath sounds normal. No respiratory distress. No wheezes. No rales.  Abdominal: Soft. Bowel sounds are normal. Exhibits no distension and no mass. There is no tenderness.  Musculoskeletal: Normal range of motion. Exhibits no edema.  Lymphadenopathy:    No cervical adenopathy.  Neurological: Alert and oriented to person, place, and time. Exhibits normal muscle tone. Gait normal. Coordination normal.  Skin: Skin is warm and dry. No rash noted. Not diaphoretic. No erythema. No pallor.   Psychiatric: Mood, memory and judgment normal.  Vitals reviewed.  LABORATORY DATA: Lab Results  Component Value Date   WBC 3.6 (L) 10/09/2018   HGB 10.7 (L) 10/09/2018   HCT 34.6 (L) 10/09/2018   MCV 81.4 10/09/2018   PLT 194 10/09/2018      Chemistry      Component Value Date/Time   NA 143 10/09/2018 1325   K 3.5 10/09/2018 1325   CL 107 10/09/2018 1325   CO2 27 10/09/2018 1325   BUN 14 10/09/2018 1325   CREATININE 0.82 10/09/2018 1325   CREATININE 1.08 (H) 08/26/2018 1235      Component Value Date/Time   CALCIUM 9.2 10/09/2018 1325   ALKPHOS 102 10/09/2018 1325   AST 14 (L) 10/09/2018 1325   AST 15 08/26/2018 1235   ALT 15 10/09/2018 1325   ALT 20 08/26/2018 1235   BILITOT 0.4 10/09/2018 1325   BILITOT 0.4 08/26/2018 1235       RADIOGRAPHIC STUDIES:  Nm Bone Scan Whole Body  Result Date: 09/10/2018 CLINICAL DATA:  Right breast cancer EXAM: NUCLEAR MEDICINE WHOLE BODY BONE SCAN TECHNIQUE: Whole body anterior and posterior images were obtained approximately 3 hours after intravenous injection of radiopharmaceutical. RADIOPHARMACEUTICALS:  19.9 mCi Technetium-83m MDP IV COMPARISON:  CT scan September 01, 2018 FINDINGS: Degenerative changes in the knees, left first MTP joint, and spine. No other significant bony abnormalities. Increased soft tissue uptake in the right breast, consistent with known malignancy. No other acute abnormalities identified. IMPRESSION: No bony metastatic disease noted. Electronically Signed   By: Dorise Bullion III M.D   On: 09/10/2018 15:32   Mr Breast Bilateral W [place] Cad  Result Date: 09/14/2018 CLINICAL DATA:  72 year old female with recently diagnosed inflammatory right breast cancer post ultrasound-guided biopsy masses at the 1 o'clock and 9 o'clock positions. Possible abnormal lymph node was identified however due to deep location was not biopsied. LABS:  Not applicable. EXAM: BILATERAL BREAST MRI WITH AND WITHOUT CONTRAST  TECHNIQUE: Multiplanar, multisequence MR images of both breasts were obtained prior to and following the intravenous administration of 8 ml of Gadavist Three-dimensional MR images were rendered by post-processing of the original MR data on an independent workstation. The three-dimensional MR images were interpreted, and findings are reported in the following complete MRI report for this study. Three dimensional images were evaluated at the independent DynaCad workstation COMPARISON:  Previous exam(s). FINDINGS: Breast composition: c.  Heterogeneous fibroglandular tissue. Background parenchymal enhancement: Moderate. Right breast: There is right breast and nipple retraction with associated skin and trabecular thickening. Biopsy clip artifact is present in the upper inner and periareolar/slightly outer right breast at sites of biopsy proven malignancy. There is abnormal masslike enhancement of nearly the entire upper right breast extending to the skin and extending inferiorly to involve the central to lower outer quadrant, in entirety measuring at least 12.6 x 12.4 x 11 cm.  This mass appears to infiltrate into the pectoralis muscle of the upper inner right breast (subtraction image 48). Left breast: No mass or abnormal enhancement. Lymph nodes: No definite morphologically abnormal lymph nodes. Ancillary findings:  None. IMPRESSION: 1. Right breast malignancy involving a large portion of the right breast, predominantly the entire upper right breast and extending inferiorly to involve the central to lower outer quadrant, and in entirety measuring at least 12.6 x 12.4 x 11 cm (although difficult to place a true measurement on this large mass). The mass extends to involve the skin of the anterior right breast and extends to and involves the pectoralis muscle. 2.  No definite right axillary lymphadenopathy. 3.  No MRI evidence of malignancy in the left breast. RECOMMENDATION: Treatment plan for known right breast  malignancy. BI-RADS CATEGORY  6: Known biopsy-proven malignancy. Electronically Signed   By: Everlean Alstrom M.D.   On: 09/14/2018 15:42     ASSESSMENT/PLAN:  Inflammatory breast cancer, right [facility]) This is a pleasant 72 year old female with inflammatory breast cancer currently receiving neoadjuvant chemotherapy.  Status post 1 cycle which she tolerated fairly well with no concerning adverse effects.  Labs from today have been reviewed.  Recommend for her to proceed with cycle #2 of CMF today as scheduled.  She will keep her follow-up visit on 10/19/2018 for evaluation and repeat lab work.  She knows to call the clinic prior to her next visit if she has any questions, problems, or concerns.   No orders of the defined types were placed in this encounter.    Mikey Bussing, DNP, AGPCNP-BC, AOCNP 10/09/18

## 2018-10-09 NOTE — Assessment & Plan Note (Signed)
This is a pleasant 72 year old female with inflammatory breast cancer currently receiving neoadjuvant chemotherapy.  Status post 1 cycle which she tolerated fairly well with no concerning adverse effects.  Labs from today have been reviewed.  Recommend for her to proceed with cycle #2 of CMF today as scheduled.  She will keep her follow-up visit on 10/19/2018 for evaluation and repeat lab work.  She knows to call the clinic prior to her next visit if she has any questions, problems, or concerns.

## 2018-10-09 NOTE — Patient Instructions (Signed)
[facility] Discharge Instructions for Patients Receiving Chemotherapy  Today you received the following chemotherapy agents :  Cytoxan, Methotrexate, Fluorouracil.  To help prevent nausea and vomiting after your treatment, we encourage you to take your nausea medication as prescribed.   If you develop nausea and vomiting that is not controlled by your nausea medication, call the clinic.   BELOW ARE SYMPTOMS THAT SHOULD BE REPORTED IMMEDIATELY:  *FEVER GREATER THAN 100.5 F  *CHILLS WITH OR WITHOUT FEVER  NAUSEA AND VOMITING THAT IS NOT CONTROLLED WITH YOUR NAUSEA MEDICATION  *UNUSUAL SHORTNESS OF BREATH  *UNUSUAL BRUISING OR BLEEDING  TENDERNESS IN MOUTH AND THROAT WITH OR WITHOUT PRESENCE OF ULCERS  *URINARY PROBLEMS  *BOWEL PROBLEMS  UNUSUAL RASH Items with * indicate a potential emergency and should be followed up as soon as possible.  Feel free to call the clinic should you have any questions or concerns. The clinic phone number is [phone].  Please show the CHEMO ALERT CARD at check-in to the Emergency Department and triage nurse.   

## 2018-10-13 MED FILL — PROCHLORPERAZINE 10 MG TAB: 10 | 7 days supply | Qty: 30 | Fill #0

## 2018-10-13 MED FILL — CLOPIDOGREL 75 MG TABLET: 75 | 90 days supply | Qty: 90 | Fill #0

## 2018-10-13 MED FILL — LISINOPRIL-HCTZ 20-12.5 MG: 20-12.5 | 30 days supply | Qty: 30 | Fill #0

## 2018-10-13 MED FILL — AMLODIPINE BESYLATE 10 MG T: 10 | 90 days supply | Qty: 90 | Fill #0

## 2018-10-14 MED FILL — ATORVASTATIN 80 MG TABLET: 80 | 90 days supply | Qty: 90 | Fill #0

## 2018-10-16 ENCOUNTER — Ambulatory Visit: Payer: Medicare Other | Admitting: Adult Health

## 2018-10-16 ENCOUNTER — Other Ambulatory Visit: Payer: Medicare Other

## 2018-10-16 ENCOUNTER — Encounter: Payer: Self-pay | Admitting: Oncology

## 2018-10-16 NOTE — Progress Notes (Signed)
Called pt to introduce myself as her Arboriculturist.  She was unavailable so I spoke to her daughter, [name].  Unfortunately there aren't any foundations offering copay assistance for her Dx and the type of ins she has.  I offered the [employer], went over what it covers and gave her the income requirement.  She would like to apply so she will bring proof of income on 10/19/18.  I will give her my card for any questions or concerns she may have in the future.

## 2018-10-19 ENCOUNTER — Other Ambulatory Visit: Payer: Medicare Other

## 2018-10-19 ENCOUNTER — Encounter: Payer: Self-pay | Admitting: Oncology

## 2018-10-19 ENCOUNTER — Encounter: Payer: Self-pay | Admitting: Adult Health

## 2018-10-19 ENCOUNTER — Inpatient Hospital Stay: Payer: Medicare Other | Attending: Oncology | Admitting: Adult Health

## 2018-10-19 ENCOUNTER — Inpatient Hospital Stay: Payer: Medicare Other

## 2018-10-19 VITALS — BP 165/66 | HR 92 | Temp 98.6°F | Resp 16 | Ht 66.0 in | Wt 173.1 lb

## 2018-10-19 DIAGNOSIS — I69359 Hemiplegia and hemiparesis following cerebral infarction affecting unspecified side: Secondary | ICD-10-CM

## 2018-10-19 DIAGNOSIS — I63132 Cerebral infarction due to embolism of left carotid artery: Secondary | ICD-10-CM

## 2018-10-19 DIAGNOSIS — Z170 Estrogen receptor positive status [ER+]: Principal | ICD-10-CM

## 2018-10-19 DIAGNOSIS — C50811 Malignant neoplasm of overlapping sites of right female breast: Secondary | ICD-10-CM | POA: Diagnosis not present

## 2018-10-19 DIAGNOSIS — Z5189 Encounter for other specified aftercare: Secondary | ICD-10-CM | POA: Insufficient documentation

## 2018-10-19 DIAGNOSIS — D701 Agranulocytosis secondary to cancer chemotherapy: Secondary | ICD-10-CM | POA: Insufficient documentation

## 2018-10-19 DIAGNOSIS — C50911 Malignant neoplasm of unspecified site of right female breast: Secondary | ICD-10-CM

## 2018-10-19 DIAGNOSIS — Z95828 Presence of other vascular implants and grafts: Secondary | ICD-10-CM

## 2018-10-19 LAB — CBC WITH DIFFERENTIAL/PLATELET
Abs Immature Granulocytes: 0.03 10*3/uL (ref 0.00–0.07)
Basophils Absolute: 0 10*3/uL (ref 0.0–0.1)
Basophils Relative: 1 %
Eosinophils Absolute: 0.1 10*3/uL (ref 0.0–0.5)
Eosinophils Relative: 2 %
HCT: 36 % (ref 36.0–46.0)
Hemoglobin: 11 g/dL — ABNORMAL LOW (ref 12.0–15.0)
Immature Granulocytes: 1 %
Lymphocytes Relative: 26 %
Lymphs Abs: 1 10*3/uL (ref 0.7–4.0)
MCH: 25.3 pg — AB (ref 26.0–34.0)
MCHC: 30.6 g/dL (ref 30.0–36.0)
MCV: 82.8 fL (ref 80.0–100.0)
MONO ABS: 0.4 10*3/uL (ref 0.1–1.0)
Monocytes Relative: 9 %
Neutro Abs: 2.3 10*3/uL (ref 1.7–7.7)
Neutrophils Relative %: 61 %
Platelets: 138 10*3/uL — ABNORMAL LOW (ref 150–400)
RBC: 4.35 MIL/uL (ref 3.87–5.11)
RDW: 14.6 % (ref 11.5–15.5)
WBC: 3.8 10*3/uL — ABNORMAL LOW (ref 4.0–10.5)
nRBC: 0 % (ref 0.0–0.2)

## 2018-10-19 LAB — COMPREHENSIVE METABOLIC PANEL
ALT: 32 U/L (ref 0–44)
AST: 19 U/L (ref 15–41)
Albumin: 3.9 g/dL (ref 3.5–5.0)
Alkaline Phosphatase: 102 U/L (ref 38–126)
Anion gap: 10 (ref 5–15)
BUN: 15 mg/dL (ref 8–23)
CO2: 27 mmol/L (ref 22–32)
Calcium: 9.3 mg/dL (ref 8.9–10.3)
Chloride: 107 mmol/L (ref 98–111)
Creatinine, Ser: 0.92 mg/dL (ref 0.44–1.00)
GFR calc Af Amer: >60 mL/min (ref >60)
GFR calc non Af Amer: >60 mL/min (ref >60)
Glucose, Bld: 114 mg/dL — ABNORMAL HIGH (ref 70–99)
POTASSIUM: 3.6 mmol/L (ref 3.5–5.1)
SODIUM: 144 mmol/L (ref 135–145)
Total Bilirubin: 0.4 mg/dL (ref 0.3–1.2)
Total Protein: 7.3 g/dL (ref 6.5–8.1)

## 2018-10-19 MED ORDER — HEPARIN SOD (PORK) LOCK FLUSH 100 UNIT/ML IV SOLN
500.0000 [IU] | Freq: Once | INTRAVENOUS | Status: DC | PRN
  Filled 2018-10-19: qty 5

## 2018-10-19 MED ORDER — SODIUM CHLORIDE 0.9% FLUSH
10.0000 mL | INTRAVENOUS | Status: DC | PRN
  Filled 2018-10-19: qty 10

## 2018-10-19 NOTE — Progress Notes (Signed)
Pt is approved for the $1000 [employer] grant.  

## 2018-10-19 NOTE — Progress Notes (Signed)
[place]  Telephone:([phone]) [phone] Fax:(336) [phone]     ID: Lamya Lausch DOB: 1946-10-02  MR#: [id number]  JYN#:[id number]  Patient Care Team: [name], MD as PCP - General (Internal Medicine) Waynetta Sandy, MD as Consulting Physician (Vascular Surgery) Patsey Berthold, NP as Nurse Practitioner (Cardiology) [name], MD as Consulting Physician (Physical Medicine and Rehabilitation) [name], [name], NP as Nurse Practitioner (Vascular Surgery) Edgardo Roys, PsyD as Consulting Physician (Psychology) Magrinat, [name], MD as Consulting Physician (Oncology) [name], MD as Consulting Physician (General Surgery) Eppie Gibson, MD as Attending Physician (Radiation Oncology) OTHER MD:  CHIEF COMPLAINT: Inflammatory breast cancer  CURRENT TREATMENT: Neoadjuvant chemotherapy   HISTORY OF CURRENT ILLNESS: Webb Laws presented with right breast discoloration and redness. The right breast also had an area of hardness and nipple retraction.  She did not bring this to medical attention but her daughter did mention it when the patient had neurologic follow-up on 08/10/2018.  At that time the nurse practitioner, [name], noted right nipple inversion, hard breast, and red non-ulcerated lesions on the breast.  The patient then underwent bilateral diagnostic mammography with tomography and right breast ultrasonography at [facility] on 08/11/2018 (this is her first ever mammogram) showing: breast density category B. There was a round 2.5 cm mass at the 1 o'clock upper inner quadrant of the right breast.  By palpation this measured approximately 10 cm.  Sonographically the irregular hypoechoic mass measured 2.7 cm. There was also a hypoechoic mass in the right breast at 9 o'clock upper outer quadrant measuring 3.8 cm. There was an abnormal right axillary lymph node with cortical thickening.   Accordingly on 08/19/2018 she proceeded to biopsy of  the right breast areas in question. The pathology from this procedure showed [phone]): At the 1 o'clock: invasive lobular carcinoma, grade II. Prognostic indicators significant for: estrogen receptor, 60% positive with moderate staining intensity and progesterone receptor, 0% negative. Proliferation marker Ki67 at 20%. HER2 negative with an immunohistochemistry of (1+).  At the 9 o'clock: invasive lobular carcinoma, grade II. Prognostic indicators significant for: estrogen receptor, 90% positive with strong staining intensity and progesterone receptor, 0% negative. Proliferation marker Ki67 at 30%. HER2 negative with an immunohistochemistry of (1+).  The patient's subsequent history is as detailed below.  INTERVAL HISTORY: Corleen returns today for further evaluation and treatment of her locally advanced breast cancer, accompanied by her daughter.  She is cycle 2 day 10 after neoadjuvant treatment with CMF.  She receives this on day 1 of a 21 day cycle.  She is tolerating this well.     REVIEW OF SYSTEMS: Jaiona is doing well today.  She notes that her breast is improved.  She is feeling well.  She doesn't have unusual headaches or vision changes.  She denies any new pain, nausea, vomiting, bowel/bladder changes.  She is without chest pain, shortness of breath, palpitations, cough.  She is getting around ok.  She denies any other issues and a detailed ROS was otherwise non contributory.    PAST MEDICAL HISTORY: Past Medical History:  Diagnosis Date  . Anxiety   . Cancer [facility])    breast cancer- right  . Carotid stenosis   . Depression    situational  . Dyspnea    with much ambulation  . GERD (gastroesophageal reflux disease)   . Hypertension   . Stroke ([place]) 04/01/2017   a little weakness right side leg and hand, a little memry loss  PAST SURGICAL HISTORY: Past Surgical History:  Procedure Laterality Date  . ABDOMINAL HYSTERECTOMY    . ENDARTERECTOMY Left 04/08/2017    Procedure: ENDARTERECTOMY CAROTID;  Surgeon: Waynetta Sandy, MD;  Location: [place];  Service: Vascular;  Laterality: Left;  . PATCH ANGIOPLASTY Left 04/08/2017   Procedure: PATCH ANGIOPLASTY Left Carotid;  Surgeon: Waynetta Sandy, MD;  Location: [place];  Service: Vascular;  Laterality: Left;  . PORTACATH PLACEMENT Left 09/07/2018   Procedure: INSERTION PORT-A-CATH LEFT INTERNAL JUGULAR;  Surgeon: [name], MD;  Location: [place];  Service: General;  Laterality: Left;  total hysterectomy with bilateral salpingo-oophorectomy in her 70's no hormone replacement  FAMILY HISTORY Family History  Problem Relation Age of Onset  . Cancer Mother        ovarian  The patient's father is alive at age 70. The patient's mother died at age 70 due to ovarian cancer. The patient had 3 brothers and 3 sisters. She denies a history of breast, pancreatic, colon, or other cancers in the family.   GYNECOLOGIC HISTORY:  No LMP recorded. Patient has had a hysterectomy. Menarche: Age at first live birth: 72 years old She is GX P1.  She is status post total hysterectomy with BSO in her early 60's. She did not use HRT.    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. She is divorced. At home is just herself, but her two grandsons regularly come to visit and stay with her. The patient's daughter, [name] works in administration for [employer]. The patient's 72 y.o. grandson is a Freight forwarder for [employer]. The patient's 11 y.o. grandson works in [employer].     ADVANCED DIRECTIVES: Not in place.  At the 08/26/2018 visit the patient was given the appropriate documents to complete and notarized at her discretion.   HEALTH MAINTENANCE: Social History   Tobacco Use  . Smoking status: Never Smoker  . Smokeless tobacco: Never Used  Substance Use Topics  . Alcohol use: No  . Drug use: No     Colonoscopy: Never  PAP: Status post hysterectomy  Bone density:  Never   No Known Allergies  Current Outpatient Medications  Medication Sig Dispense Refill  . amLODipine (NORVASC) 10 MG tablet Take 1 tablet (10 mg total) by mouth daily. PATIENT MUST HAVE OFFICE VISIT AND LABS PRIOR TO ANY FURTHER REFILLS 15 tablet 0  . atorvastatin (LIPITOR) 80 MG tablet Take 1 tablet (80 mg total) by mouth daily at 6 PM. 30 tablet 0  . clopidogrel (PLAVIX) 75 MG tablet TAKE 1 TABLET BY MOUTH EVERY DAY 30 tablet 1  . lidocaine-prilocaine (EMLA) cream Apply to affected area once 30 g 3  . lisinopril-hydrochlorothiazide (PRINZIDE,ZESTORETIC) 20-12.5 MG tablet Take 1 tablet by mouth daily.  11  . loratadine (CLARITIN) 10 MG tablet Take 10 mg by mouth daily as needed for allergies.    . pantoprazole (PROTONIX) 40 MG tablet TAKE 1 TABLET BY MOUTH EVERY DAY 30 tablet 1  . prochlorperazine (COMPAZINE) 10 MG tablet Take 1 tablet (10 mg total) by mouth every 6 (six) hours as needed (Nausea or vomiting). 30 tablet 1  . traZODone (DESYREL) 50 MG tablet Take 0.5-1 tablets (25-50 mg total) by mouth at bedtime as needed for sleep. 15 tablet 0   No current facility-administered medications for this visit.    Facility-Administered Medications Ordered in Other Visits  Medication Dose Route Frequency Provider Last Rate Last Dose  . heparin lock  flush 100 unit/mL  500 Units Intracatheter Once PRN Magrinat, [name], MD      . sodium chloride flush (NS) 0.9 % injection 10 mL  10 mL Intracatheter PRN Magrinat, [name], MD        OBJECTIVE:  Vitals:   10/19/18 1134  BP: (!) 165/66  Pulse: 92  Resp: 16  Temp: 98.6 F (37 C)  SpO2: 100%     Body mass index is 27.94 kg/m.   Wt Readings from Last 3 Encounters:  10/19/18 173 lb 1.6 oz (78.5 kg)  10/09/18 170 lb 8 oz (77.3 kg)  09/23/18 168 lb 3.2 oz (76.3 kg)      ECOG FS:2 - Symptomatic, <50% confined to bed  GENERAL: Patient is a well appearing female in no acute distress HEENT:  Sclerae anicteric.  Oropharynx clear and moist.  No ulcerations or evidence of oropharyngeal candidiasis. Neck is supple.  NODES:  No cervical, supraclavicular, or axillary lymphadenopathy palpated.  BREAST EXAM:  Slightly improved from most recent pictures LUNGS:  Clear to auscultation bilaterally.  No wheezes or rhonchi. HEART:  Regular rate and rhythm. No murmur appreciated. ABDOMEN:  Soft, nontender.  Positive, normoactive bowel sounds. No organomegaly palpated. MSK:  No focal spinal tenderness to palpation. Full range of motion bilaterally in the upper extremities. EXTREMITIES:  No peripheral edema.   SKIN:  Clear with no obvious rashes or skin changes. No nail dyscrasia. NEURO:  Nonfocal. Well oriented.  Appropriate affect.     Right Breast 08/26/2018   09/23/18 Cycle 1 day 8     LAB RESULTS:  CMP     Component Value Date/Time   NA 143 10/09/2018 1325   K 3.5 10/09/2018 1325   CL 107 10/09/2018 1325   CO2 27 10/09/2018 1325   GLUCOSE 116 (H) 10/09/2018 1325   BUN 14 10/09/2018 1325   CREATININE 0.82 10/09/2018 1325   CREATININE 1.08 (H) 08/26/2018 1235   CALCIUM 9.2 10/09/2018 1325   PROT 7.1 10/09/2018 1325   ALBUMIN 3.7 10/09/2018 1325   AST 14 (L) 10/09/2018 1325   AST 15 08/26/2018 1235   ALT 15 10/09/2018 1325   ALT 20 08/26/2018 1235   ALKPHOS 102 10/09/2018 1325   BILITOT 0.4 10/09/2018 1325   BILITOT 0.4 08/26/2018 1235   GFRNONAA >60 10/09/2018 1325   GFRNONAA 51 (L) 08/26/2018 1235   GFRAA >60 10/09/2018 1325   GFRAA 59 (L) 08/26/2018 1235    No results found for: TOTALPROTELP, ALBUMINELP, A1GS, A2GS, BETS, BETA2SER, GAMS, MSPIKE, SPEI  No results found for: KPAFRELGTCHN, LAMBDASER, KAPLAMBRATIO  Lab Results  Component Value Date   WBC 3.8 (L) 10/19/2018   NEUTROABS 2.3 10/19/2018   HGB 11.0 (L) 10/19/2018   HCT 36.0 10/19/2018   MCV 82.8 10/19/2018   PLT 138 (L) 10/19/2018    _0 @  No results found for: LABCA2  No components found for: [id number]  No results for  input(s): INR in the last 168 hours.  No results found for: LABCA2  No results found for: CAN199  No results found for: NAT557  No results found for: DUK025  Lab Results  Component Value Date   CA2729 54.4 (H) 09/15/2018    No components found for: HGQUANT  No results found for: CEA1 / No results found for: CEA1   No results found for: AFPTUMOR  No results found for: [place]  No results found for: PSA1  Appointment on 10/19/2018  Component Date Value Ref Range Status  .  WBC 10/19/2018 3.8* 4.0 - 10.5 K/uL Final  . RBC 10/19/2018 4.35  3.87 - 5.11 MIL/uL Final  . Hemoglobin 10/19/2018 11.0* 12.0 - 15.0 g/dL Final  . HCT 10/19/2018 36.0  36.0 - 46.0 % Final  . MCV 10/19/2018 82.8  80.0 - 100.0 fL Final  . MCH 10/19/2018 25.3* 26.0 - 34.0 pg Final  . MCHC 10/19/2018 30.6  30.0 - 36.0 g/dL Final  . RDW 10/19/2018 14.6  11.5 - 15.5 % Final  . Platelets 10/19/2018 138* 150 - 400 K/uL Final  . nRBC 10/19/2018 0.0  0.0 - 0.2 % Final  . Neutrophils Relative % 10/19/2018 61  % Final  . Neutro Abs 10/19/2018 2.3  1.7 - 7.7 K/uL Final  . Lymphocytes Relative 10/19/2018 26  % Final  . Lymphs Abs 10/19/2018 1.0  0.7 - 4.0 K/uL Final  . Monocytes Relative 10/19/2018 9  % Final  . Monocytes Absolute 10/19/2018 0.4  0.1 - 1.0 K/uL Final  . Eosinophils Relative 10/19/2018 2  % Final  . Eosinophils Absolute 10/19/2018 0.1  0.0 - 0.5 K/uL Final  . Basophils Relative 10/19/2018 1  % Final  . Basophils Absolute 10/19/2018 0.0  0.0 - 0.1 K/uL Final  . Immature Granulocytes 10/19/2018 1  % Final  . Abs Immature Granulocytes 10/19/2018 0.03  0.00 - 0.07 K/uL Final   Performed at [facility], [address] [place], [place] [address]    (this displays the last labs from the last 3 days)  No results found for: TOTALPROTELP, ALBUMINELP, A1GS, A2GS, BETS, BETA2SER, GAMS, MSPIKE, SPEI (this displays SPEP labs)  No results found for: KPAFRELGTCHN, LAMBDASER,  KAPLAMBRATIO (kappa/lambda light chains)  No results found for: HGBA, HGBA2QUANT, HGBFQUANT, HGBSQUAN (Hemoglobinopathy evaluation)   No results found for: LDH  No results found for: IRON, TIBC, IRONPCTSAT (Iron and TIBC)  No results found for: FERRITIN  Urinalysis    Component Value Date/Time   COLORURINE AMBER (A) 04/01/2017 0920   APPEARANCEUR CLOUDY (A) 04/01/2017 0920   LABSPEC 1.021 04/01/2017 0920   PHURINE 5.0 04/01/2017 0920   GLUCOSEU NEGATIVE 04/01/2017 0920   HGBUR MODERATE (A) 04/01/2017 0920   BILIRUBINUR NEGATIVE 04/01/2017 0920   KETONESUR 80 (A) 04/01/2017 0920   PROTEINUR NEGATIVE 04/01/2017 0920   NITRITE NEGATIVE 04/01/2017 0920   LEUKOCYTESUR LARGE (A) 04/01/2017 0920     STUDIES: No results found.   ELIGIBLE FOR AVAILABLE RESEARCH PROTOCOL: no  ASSESSMENT: 72 y.o. [place], [place] woman status post right breast biopsy of overlapping sites on 08/19/2018 for a clinical T4 N1, stage IIIB invasive lobular carcinoma, estrogen receptor positive, progesterone receptor negative, HER-2 not amplified, with an MIB-1 of 20%.  (1) genetics testing pending  (2) neoadjuvant chemotherapy to consist of cyclophosphamide, methotrexate and fluorouracil every 21 days x 8,  begin 09/16/2018  (3) definitive surgery to follow  (4) adjuvant radiation  (5) antiestrogens therapy at the completion of local treatment  (6) foundation 1/PD-L1 testing  PLAN:  Daylynn is doing well today.  Her CBC is stable and she is not neutropenic.   I reviewed this with her in detail.  Her CMET was pending at time of appointment completion.  She is tolerating the chemotherapy well.  Her breast appears slightly improved.    She will return on 12/20 for labs, f/u with Dr. Jana Hakim and her next cycle of chemotherapy.  She knows to call for any questions or concerns that may arise between now and her next appointment.  A total of (20) minutes of face-to-face time was spent with this  patient with greater than 50% of that time in counseling and care-coordination.   Wilber Bihari, NP  10/19/18 11:41 AM Medical Oncology and Hematology [facility] [address] [place], [place] [address] Tel. [phone]    Fax. [phone]

## 2018-10-20 ENCOUNTER — Telehealth: Payer: Self-pay | Admitting: Oncology

## 2018-10-20 NOTE — Telephone Encounter (Signed)
Called regarding 12/20

## 2018-10-27 NOTE — Progress Notes (Addendum)
[place]  [phone] [phone]     ID: [name] [name] DOB: 03/30/1946  MR#: [id number]  JYN#:[id number]  Patient Care Team: [name], MD as PCP - General (Internal Medicine) Waynetta Sandy, MD as Consulting Physician (Vascular Surgery) Patsey Berthold, NP as Nurse Practitioner (Cardiology) [name], MD as Consulting Physician (Physical Medicine and Rehabilitation) [name], [name], NP as Nurse Practitioner (Vascular Surgery) Edgardo Roys, PsyD as Consulting Physician (Psychology) Magrinat, [name], MD as Consulting Physician (Oncology) [name], MD as Consulting Physician (General Surgery) Eppie Gibson, MD as Attending Physician (Radiation Oncology) OTHER MD:   CHIEF COMPLAINT: Inflammatory breast cancer   CURRENT TREATMENT: Neoadjuvant chemotherapy   INTERVAL HISTORY: [name] returns today for further evaluation and treatment of her locally advanced breast cancer, accompanied by her daughter.   She continues on CMF, given every 21 days, there is being day 1 cycle 3 of 8 planned cycles. Her treatments are going well overall. She had some constipation. No nausea vomiting mouth sores.   Her last echocardiogram on 04/03/2017 showed an ejection fraction in the 65% -   70% range.   Previous NM bone scan done 09/10/2018 showed: Degenerative changes in the knees, left first MTP joint, and spine.No other significant bony abnormalities. Increased soft tissue uptake in the right breast, consistent with known malignancy. No other acute abnormalities identified.   Since her last visit, she underwent a bilateral breast MRI with and without contrast on 09/13/18 showing: Right breast malignancy involving a large portion of the right breast, predominantly the entire upper right breast and extending inferiorly to involve the central to lower outer quadrant, and in entirety measuring at least 12.6 x 12.4 x 11 cm (although  difficult to place a true measurement on this large mass). The mass extends to involve the skin of the anterior right breast and extends to and involves the pectoralis muscle. No definite right axillary lymphadenopathy.  No MRI evidence of malignancy in the left breast. Breast composition: C.  Heterogeneous fibroglandular tissue.   REVIEW OF SYSTEMS: Pennie is doing well overall. She has not been doing much activity beyond [place] house chores like folding laundry. The patient denies unusual headaches, visual changes, nausea, vomiting, or dizziness. There has been no unusual cough, phlegm production, or pleurisy. This been no change in bladder habits. The patient denies unexplained fatigue or unexplained weight loss, bleeding, rash, or fever. A detailed review of systems was otherwise noncontributory.    HISTORY OF CURRENT ILLNESS: [name] presented with right breast discoloration and redness. The right breast also had an area of hardness and nipple retraction.  She did not bring this to medical attention but her daughter did mention it when the patient had neurologic follow-up on 08/10/2018.  At that time the nurse practitioner, [name], noted right nipple inversion, hard breast, and red non-ulcerated lesions on the breast.  The patient then underwent bilateral diagnostic mammography with tomography and right breast ultrasonography at [facility] on 08/11/2018 (this is her first ever mammogram) showing: breast density category B. There was a round 2.5 cm mass at the 1 o'clock upper inner quadrant of the right breast.  By palpation this measured approximately 10 cm.  Sonographically the irregular hypoechoic mass measured 2.7 cm. There was also a hypoechoic mass in the right breast at 9 o'clock upper outer quadrant measuring 3.8 cm. There was an abnormal right axillary lymph node with cortical thickening.   Accordingly on 08/19/2018 she proceeded to  biopsy of the right breast areas in question. The  pathology from this procedure showed [phone]): At the 1 o'clock: invasive lobular carcinoma, grade II. Prognostic indicators significant for: estrogen receptor, 60% positive with moderate staining intensity and progesterone receptor, 0% negative. Proliferation marker Ki67 at 20%. HER2 negative with an immunohistochemistry of (1+).  At the 9 o'clock: invasive lobular carcinoma, grade II. Prognostic indicators significant for: estrogen receptor, 90% positive with strong staining intensity and progesterone receptor, 0% negative. Proliferation marker Ki67 at 30%. HER2 negative with an immunohistochemistry of (1+).  The patient's subsequent history is as detailed below.    PAST MEDICAL HISTORY: Past Medical History:  Diagnosis Date  . Anxiety   . Cancer [facility])    breast cancer- right  . Carotid stenosis   . Depression    situational  . Dyspnea    with much ambulation  . GERD (gastroesophageal reflux disease)   . Hypertension   . Stroke ([place]) 04/01/2017   a little weakness right side leg and hand, a little memry loss    PAST SURGICAL HISTORY: Past Surgical History:  Procedure Laterality Date  . ABDOMINAL HYSTERECTOMY    . ENDARTERECTOMY Left 04/08/2017   Procedure: ENDARTERECTOMY CAROTID;  Surgeon: Waynetta Sandy, MD;  Location: [place];  Service: Vascular;  Laterality: Left;  . PATCH ANGIOPLASTY Left 04/08/2017   Procedure: PATCH ANGIOPLASTY Left Carotid;  Surgeon: Waynetta Sandy, MD;  Location: [place];  Service: Vascular;  Laterality: Left;  . PORTACATH PLACEMENT Left 09/07/2018   Procedure: INSERTION PORT-A-CATH LEFT INTERNAL JUGULAR;  Surgeon: [name], MD;  Location: [place];  Service: General;  Laterality: Left;  total hysterectomy with bilateral salpingo-oophorectomy in her 58's no hormone replacement  FAMILY HISTORY Family History  Problem Relation Age of Onset  . Cancer Mother        ovarian  The patient's father is alive at age 35. The patient's  mother died at age 63 due to ovarian cancer. The patient had 3 brothers and 3 sisters. She denies a history of breast, pancreatic, colon, or other cancers in the family.   GYNECOLOGIC HISTORY:  No LMP recorded. Patient has had a hysterectomy. Menarche: Age at first live birth: 72 years old She is GX P1.  She is status post total hysterectomy with BSO in her early 24's. She did not use HRT.    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. She is divorced. At home is just herself, but her two grandsons regularly come to visit and stay with her. The patient's daughter, [name] works in administration for [employer]. The patient's 46 y.o. grandson is a Freight forwarder for [employer]. The patient's 55 y.o. grandson works in [employer].     ADVANCED DIRECTIVES: Not in place.  At the 08/26/2018 visit the patient was given the appropriate documents to complete and notarized at her discretion.   HEALTH MAINTENANCE: Social History   Tobacco Use  . Smoking status: Never Smoker  . Smokeless tobacco: Never Used  Substance Use Topics  . Alcohol use: No  . Drug use: No     Colonoscopy: Never  PAP: Status post hysterectomy  Bone density: Never   No Known Allergies  Current Outpatient Medications  Medication Sig Dispense Refill  . amLODipine (NORVASC) 10 MG tablet Take 1 tablet (10 mg total) by mouth daily. PATIENT MUST HAVE OFFICE VISIT AND LABS PRIOR TO ANY FURTHER REFILLS 15 tablet 0  . atorvastatin (LIPITOR) 80  MG tablet Take 1 tablet (80 mg total) by mouth daily at 6 PM. 30 tablet 0  . clopidogrel (PLAVIX) 75 MG tablet TAKE 1 TABLET BY MOUTH EVERY DAY 30 tablet 1  . lidocaine-prilocaine (EMLA) cream Apply to affected area once 30 g 3  . lisinopril-hydrochlorothiazide (PRINZIDE,ZESTORETIC) 20-12.5 MG tablet Take 1 tablet by mouth daily.  11  . loratadine (CLARITIN) 10 MG tablet Take 10 mg by mouth daily as needed for allergies.    . pantoprazole (PROTONIX) 40 MG  tablet TAKE 1 TABLET BY MOUTH EVERY DAY 30 tablet 1  . prochlorperazine (COMPAZINE) 10 MG tablet Take 1 tablet (10 mg total) by mouth every 6 (six) hours as needed (Nausea or vomiting). 30 tablet 1  . traZODone (DESYREL) 50 MG tablet Take 0.5-1 tablets (25-50 mg total) by mouth at bedtime as needed for sleep. 15 tablet 0   No current facility-administered medications for this visit.    Facility-Administered Medications Ordered in Other Visits  Medication Dose Route Frequency Provider Last Rate Last Dose  . heparin lock flush 100 unit/mL  500 Units Intracatheter Once PRN Magrinat, [name], MD      . sodium chloride flush (NS) 0.9 % injection 10 mL  10 mL Intracatheter PRN Magrinat, [name], MD        OBJECTIVE: Middle-aged African-American woman in no acute distress Vitals:   10/30/18 0850  BP: (!) 165/68  Pulse: 95  Resp: 17  Temp: 98.3 F (36.8 C)  SpO2: 100%     Body mass index is 28.5 kg/m.   Wt Readings from Last 3 Encounters:  10/30/18 176 lb 9.6 oz (80.1 kg)  10/19/18 173 lb 1.6 oz (78.5 kg)  10/09/18 170 lb 8 oz (77.3 kg)      ECOG FS:2 - Symptomatic, <50% confined to bed  Sclerae unicteric, EOMs intact No cervical or supraclavicular adenopathy Lungs no rales or rhonchi Heart regular rate and rhythm Abd soft, nontender, positive bowel sounds MSK no focal spinal tenderness, no upper extremity lymphedema Neuro: nonfocal, well oriented, appropriate affect Breasts: The right breast still shows a very large palpable mass, occupying most of the breast, movable, and involving the skin with some erythema, and 2 or 3 Whirley areas that suggest possible early ulceration.  There has not been a major response so far.  Left breast is benign.  Both axillae are benign. .     Right Breast 08/26/2018   09/23/18 Cycle 1 day 8     LAB RESULTS:  CMP     Component Value Date/Time   NA 144 10/19/2018 1123   K 3.6 10/19/2018 1123   CL 107 10/19/2018 1123   CO2 27  10/19/2018 1123   GLUCOSE 114 (H) 10/19/2018 1123   BUN 15 10/19/2018 1123   CREATININE 0.92 10/19/2018 1123   CREATININE 1.08 (H) 08/26/2018 1235   CALCIUM 9.3 10/19/2018 1123   PROT 7.3 10/19/2018 1123   ALBUMIN 3.9 10/19/2018 1123   AST 19 10/19/2018 1123   AST 15 08/26/2018 1235   ALT 32 10/19/2018 1123   ALT 20 08/26/2018 1235   ALKPHOS 102 10/19/2018 1123   BILITOT 0.4 10/19/2018 1123   BILITOT 0.4 08/26/2018 1235   GFRNONAA >60 10/19/2018 1123   GFRNONAA 51 (L) 08/26/2018 1235   GFRAA >60 10/19/2018 1123   GFRAA 59 (L) 08/26/2018 1235    No results found for: TOTALPROTELP, ALBUMINELP, A1GS, A2GS, BETS, BETA2SER, GAMS, MSPIKE, SPEI  No results found for: KPAFRELGTCHN, LAMBDASER, KAPLAMBRATIO  Lab Results  Component Value Date   WBC 3.8 (L) 10/19/2018   NEUTROABS 2.3 10/19/2018   HGB 11.0 (L) 10/19/2018   HCT 36.0 10/19/2018   MCV 82.8 10/19/2018   PLT 138 (L) 10/19/2018    '@LASTCHEMISTRY'$ @  No results found for: LABCA2  No components found for: XNATFT732  No results for input(s): INR in the last 168 hours.  No results found for: LABCA2  No results found for: KGU542  No results found for: HCW237  No results found for: SEG315  Lab Results  Component Value Date   CA2729 54.4 (H) 09/15/2018    No components found for: HGQUANT  No results found for: CEA1 / No results found for: CEA1   No results found for: AFPTUMOR  No results found for: CHROMOGRNA  No results found for: PSA1  No visits with results within 3 Day(s) from this visit.  Latest known visit with results is:  Appointment on 10/19/2018  Component Date Value Ref Range Status  . Sodium 10/19/2018 144  135 - 145 mmol/L Final  . Potassium 10/19/2018 3.6  3.5 - 5.1 mmol/L Final  . Chloride 10/19/2018 107  98 - 111 mmol/L Final  . CO2 10/19/2018 27  22 - 32 mmol/L Final  . Glucose, Bld 10/19/2018 114* 70 - 99 mg/dL Final  . BUN 10/19/2018 15  8 - 23 mg/dL Final  . Creatinine, Ser  10/19/2018 0.92  0.44 - 1.00 mg/dL Final  . Calcium 10/19/2018 9.3  8.9 - 10.3 mg/dL Final  . Total Protein 10/19/2018 7.3  6.5 - 8.1 g/dL Final  . Albumin 10/19/2018 3.9  3.5 - 5.0 g/dL Final  . AST 10/19/2018 19  15 - 41 U/L Final  . ALT 10/19/2018 32  0 - 44 U/L Final  . Alkaline Phosphatase 10/19/2018 102  38 - 126 U/L Final  . Total Bilirubin 10/19/2018 0.4  0.3 - 1.2 mg/dL Final  . GFR calc non Af Amer 10/19/2018 >60  >60 mL/min Final  . GFR calc Af Amer 10/19/2018 >60  >60 mL/min Final  . Anion gap 10/19/2018 10  5 - 15 Final   Performed at [facility], [place] [address] [place], [place] [address]  . WBC 10/19/2018 3.8* 4.0 - 10.5 K/uL Final  . RBC 10/19/2018 4.35  3.87 - 5.11 MIL/uL Final  . Hemoglobin 10/19/2018 11.0* 12.0 - 15.0 g/dL Final  . HCT 10/19/2018 36.0  36.0 - 46.0 % Final  . MCV 10/19/2018 82.8  80.0 - 100.0 fL Final  . MCH 10/19/2018 25.3* 26.0 - 34.0 pg Final  . MCHC 10/19/2018 30.6  30.0 - 36.0 g/dL Final  . RDW 10/19/2018 14.6  11.5 - 15.5 % Final  . Platelets 10/19/2018 138* 150 - 400 K/uL Final  . nRBC 10/19/2018 0.0  0.0 - 0.2 % Final  . Neutrophils Relative % 10/19/2018 61  % Final  . Neutro Abs 10/19/2018 2.3  1.7 - 7.7 K/uL Final  . Lymphocytes Relative 10/19/2018 26  % Final  . Lymphs Abs 10/19/2018 1.0  0.7 - 4.0 K/uL Final  . Monocytes Relative 10/19/2018 9  % Final  . Monocytes Absolute 10/19/2018 0.4  0.1 - 1.0 K/uL Final  . Eosinophils Relative 10/19/2018 2  % Final  . Eosinophils Absolute 10/19/2018 0.1  0.0 - 0.5 K/uL Final  . Basophils Relative 10/19/2018 1  % Final  . Basophils Absolute 10/19/2018 0.0  0.0 - 0.1 K/uL Final  . Immature Granulocytes 10/19/2018 1  %  Final  . Abs Immature Granulocytes 10/19/2018 0.03  0.00 - 0.07 K/uL Final   Performed at [facility], [place] [address] [place], [place] [address]    (this displays the last labs from the last 3 days)  No results found for:  TOTALPROTELP, ALBUMINELP, A1GS, A2GS, BETS, BETA2SER, GAMS, MSPIKE, SPEI (this displays SPEP labs)  No results found for: KPAFRELGTCHN, LAMBDASER, KAPLAMBRATIO (kappa/lambda light chains)  No results found for: HGBA, HGBA2QUANT, HGBFQUANT, HGBSQUAN (Hemoglobinopathy evaluation)   No results found for: LDH  No results found for: IRON, TIBC, IRONPCTSAT (Iron and TIBC)  No results found for: FERRITIN  Urinalysis    Component Value Date/Time   COLORURINE AMBER (A) 04/01/2017 0920   APPEARANCEUR CLOUDY (A) 04/01/2017 0920   LABSPEC 1.021 04/01/2017 0920   PHURINE 5.0 04/01/2017 0920   GLUCOSEU NEGATIVE 04/01/2017 0920   HGBUR MODERATE (A) 04/01/2017 0920   BILIRUBINUR NEGATIVE 04/01/2017 0920   KETONESUR 80 (A) 04/01/2017 0920   PROTEINUR NEGATIVE 04/01/2017 0920   NITRITE NEGATIVE 04/01/2017 0920   LEUKOCYTESUR LARGE (A) 04/01/2017 0920     STUDIES: MRI results reviewed with the patient  ELIGIBLE FOR AVAILABLE RESEARCH PROTOCOL: no  ASSESSMENT: 72 y.o. [place], [place] woman status post right breast biopsy of overlapping sites on 08/19/2018 for a clinical T4 N1, stage IIIB invasive lobular carcinoma, estrogen receptor positive, progesterone receptor negative, HER-2 not amplified, with an MIB-1 of 20%.  (a) CT chest/abd/pelvis 09/02/2018 and bone scan 09/10/2018 show no evidence of stage IV disease  (b) breast MRI 09/14/2018 confirms a multicentric 12.6 cm right breast mass infiltrating pectpralis but w/o associated adenopathy  (1) genetics testing pending  (2) neoadjuvant chemotherapy to consist of cyclophosphamide, methotrexate and fluorouracil every 21 days x 8,  begin 09/16/2018  (3) definitive surgery to follow  (4) adjuvant radiation  (5) antiestrogens therapy at the completion of local treatment  (6) foundation 1/PD-L1 testing  PLAN:  Karry is tolerating CMF chemotherapy remarkably well and I do not think she needs to be seen on day 7 or so after each cycle.   We discussed that and she is comfortable being seen on the day of treatment alone, which I do think is adequate.  She is having some response although not terribly marked.  We are going to treat her today and 3 weeks from today and then I will see her again 6 weeks from today and at that time if we have not had a significant response we will consider switching to CEA treatment for the last 4 cycles.  That in mind noticed that she had an echocardiogram in May 2018 which shows an excellent ejection fraction  She knows to call for any other issue that may develop before the next visit.    Magrinat, [name], MD  10/30/18 9:02 AM Medical Oncology and Hematology [facility] [address] [place], [place] [address] Tel. [phone]    Fax. [phone]    I, Jacqualyn Posey am acting as a Education administrator for Chauncey Cruel, MD.   I, Lurline Del MD, have reviewed the above documentation for accuracy and completeness, and I agree with the above.    ADDENDUM: Unfortunately Ms. deblasi ANC today was less than 1.0.  We are adding a Udenyca.  She will have her next cycle a week from now and then she will receive Udenyca with each subsequent cycle.

## 2018-10-28 DIAGNOSIS — H2513 Age-related nuclear cataract, bilateral: Secondary | ICD-10-CM | POA: Diagnosis not present

## 2018-10-28 DIAGNOSIS — H40033 Anatomical narrow angle, bilateral: Secondary | ICD-10-CM | POA: Diagnosis not present

## 2018-10-30 ENCOUNTER — Other Ambulatory Visit: Payer: Medicare Other

## 2018-10-30 ENCOUNTER — Other Ambulatory Visit: Payer: Self-pay | Admitting: *Deleted

## 2018-10-30 ENCOUNTER — Inpatient Hospital Stay: Payer: Medicare Other

## 2018-10-30 ENCOUNTER — Inpatient Hospital Stay (HOSPITAL_BASED_OUTPATIENT_CLINIC_OR_DEPARTMENT_OTHER): Payer: Medicare Other | Admitting: Oncology

## 2018-10-30 VITALS — BP 165/68 | HR 95 | Temp 98.3°F | Resp 17 | Ht 66.0 in | Wt 176.6 lb

## 2018-10-30 DIAGNOSIS — C50811 Malignant neoplasm of overlapping sites of right female breast: Secondary | ICD-10-CM

## 2018-10-30 DIAGNOSIS — C50911 Malignant neoplasm of unspecified site of right female breast: Secondary | ICD-10-CM

## 2018-10-30 DIAGNOSIS — Z170 Estrogen receptor positive status [ER+]: Secondary | ICD-10-CM | POA: Diagnosis not present

## 2018-10-30 DIAGNOSIS — Z5189 Encounter for other specified aftercare: Secondary | ICD-10-CM | POA: Diagnosis not present

## 2018-10-30 DIAGNOSIS — I69359 Hemiplegia and hemiparesis following cerebral infarction affecting unspecified side: Secondary | ICD-10-CM

## 2018-10-30 DIAGNOSIS — D701 Agranulocytosis secondary to cancer chemotherapy: Secondary | ICD-10-CM | POA: Diagnosis not present

## 2018-10-30 DIAGNOSIS — Z95828 Presence of other vascular implants and grafts: Secondary | ICD-10-CM

## 2018-10-30 DIAGNOSIS — I63132 Cerebral infarction due to embolism of left carotid artery: Secondary | ICD-10-CM

## 2018-10-30 LAB — COMPREHENSIVE METABOLIC PANEL
ALT: 37 U/L (ref 0–44)
AST: 21 U/L (ref 15–41)
Albumin: 4 g/dL (ref 3.5–5.0)
Alkaline Phosphatase: 105 U/L (ref 38–126)
Anion gap: 9 (ref 5–15)
BUN: 15 mg/dL (ref 8–23)
CO2: 27 mmol/L (ref 22–32)
Calcium: 9.5 mg/dL (ref 8.9–10.3)
Chloride: 106 mmol/L (ref 98–111)
Creatinine, Ser: 0.84 mg/dL (ref 0.44–1.00)
Glucose, Bld: 118 mg/dL — ABNORMAL HIGH (ref 70–99)
Potassium: 3.5 mmol/L (ref 3.5–5.1)
Sodium: 142 mmol/L (ref 135–145)
Total Bilirubin: 0.4 mg/dL (ref 0.3–1.2)
Total Protein: 7.6 g/dL (ref 6.5–8.1)

## 2018-10-30 LAB — CBC WITH DIFFERENTIAL/PLATELET
Abs Immature Granulocytes: 0.03 10*3/uL (ref 0.00–0.07)
BASOS PCT: 2 %
Basophils Absolute: 0 10*3/uL (ref 0.0–0.1)
Eosinophils Absolute: 0.2 10*3/uL (ref 0.0–0.5)
Eosinophils Relative: 7 %
HCT: 37.4 % (ref 36.0–46.0)
Hemoglobin: 11.4 g/dL — ABNORMAL LOW (ref 12.0–15.0)
Immature Granulocytes: 1 %
Lymphocytes Relative: 34 %
Lymphs Abs: 0.9 10*3/uL (ref 0.7–4.0)
MCH: 25.2 pg — ABNORMAL LOW (ref 26.0–34.0)
MCHC: 30.5 g/dL (ref 30.0–36.0)
MCV: 82.7 fL (ref 80.0–100.0)
Monocytes Absolute: 0.7 10*3/uL (ref 0.1–1.0)
Monocytes Relative: 29 %
Neutro Abs: 0.7 10*3/uL — ABNORMAL LOW (ref 1.7–7.7)
Neutrophils Relative %: 27 %
Platelets: 202 10*3/uL (ref 150–400)
RBC: 4.52 MIL/uL (ref 3.87–5.11)
RDW: 15.6 % — ABNORMAL HIGH (ref 11.5–15.5)
WBC: 2.6 10*3/uL — ABNORMAL LOW (ref 4.0–10.5)
nRBC: 0 % (ref 0.0–0.2)

## 2018-10-30 MED ORDER — SODIUM CHLORIDE 0.9% FLUSH
10.0000 mL | Freq: Once | INTRAVENOUS | Status: AC
  Administered 2018-10-30: 10 mL via INTRAVENOUS
  Filled 2018-10-30: qty 10

## 2018-10-30 MED ORDER — PEGFILGRASTIM-CBQV 6 MG/0.6ML ~~LOC~~ SOSY: 6.0000 mg | PREFILLED_SYRINGE | Freq: Once | SUBCUTANEOUS | Status: DC

## 2018-10-30 MED ORDER — PEGFILGRASTIM-CBQV 6 MG/0.6ML ~~LOC~~ SOSY
PREFILLED_SYRINGE | SUBCUTANEOUS | Status: AC
  Filled 2018-10-30: qty 0.6

## 2018-10-30 MED ORDER — HEPARIN SOD (PORK) LOCK FLUSH 100 UNIT/ML IV SOLN
500.0000 [IU] | Freq: Once | INTRAVENOUS | Status: AC
  Administered 2018-10-30: 500 [IU] via INTRAVENOUS
  Filled 2018-10-30: qty 5

## 2018-10-30 MED ORDER — SODIUM CHLORIDE 0.9% FLUSH
10.0000 mL | INTRAVENOUS | Status: DC | PRN
  Administered 2018-10-30: 10 mL
  Filled 2018-10-30: qty 10

## 2018-10-30 MED ORDER — PEGFILGRASTIM-CBQV 6 MG/0.6ML ~~LOC~~ SOSY
6.0000 mg | PREFILLED_SYRINGE | Freq: Once | SUBCUTANEOUS | Status: AC
  Administered 2018-10-30: 6 mg via SUBCUTANEOUS

## 2018-10-30 MED ORDER — TRAZODONE HCL 50 MG PO TABS: 50.0000 mg | ORAL_TABLET | Freq: Every evening | ORAL | 1 refills | Status: DC | PRN

## 2018-10-30 NOTE — Addendum Note (Signed)
Addended by: Chauncey Cruel on: 10/30/2018 09:53 AM   Modules accepted: Orders

## 2018-10-30 NOTE — Patient Instructions (Signed)
Pegfilgrastim injection  What is this medicine?  PEGFILGRASTIM (PEG fil gra stim) is a long-acting granulocyte colony-stimulating factor that stimulates the growth of neutrophils, a type of white blood cell important in the body's fight against infection. It is used to reduce the incidence of fever and infection in patients with certain types of cancer who are receiving chemotherapy that affects the bone marrow, and to increase survival after being exposed to high doses of radiation.  This medicine may be used for other purposes; ask your health care provider or pharmacist if you have questions.  COMMON BRAND NAME(S): Fulphila, Neulasta, UDENYCA  What should I tell my health care provider before I take this medicine?  They need to know if you have any of these conditions:  -kidney disease  -latex allergy  -ongoing radiation therapy  -sickle cell disease  -skin reactions to acrylic adhesives (On-Body Injector only)  -an unusual or allergic reaction to pegfilgrastim, filgrastim, other medicines, foods, dyes, or preservatives  -pregnant or trying to get pregnant  -breast-feeding  How should I use this medicine?  This medicine is for injection under the skin. If you get this medicine at home, you will be taught how to prepare and give the pre-filled syringe or how to use the On-body Injector. Refer to the patient Instructions for Use for detailed instructions. Use exactly as directed. Tell your healthcare provider immediately if you suspect that the On-body Injector may not have performed as intended or if you suspect the use of the On-body Injector resulted in a missed or partial dose.  It is important that you put your used needles and syringes in a special sharps container. Do not put them in a trash can. If you do not have a sharps container, call your pharmacist or healthcare provider to get one.  Talk to your pediatrician regarding the use of this medicine in children. While this drug may be prescribed for  selected conditions, precautions do apply.  Overdosage: If you think you have taken too much of this medicine contact a poison control center or emergency room at once.  NOTE: This medicine is only for you. Do not share this medicine with others.  What if I miss a dose?  It is important not to miss your dose. Call your doctor or health care professional if you miss your dose. If you miss a dose due to an On-body Injector failure or leakage, a new dose should be administered as soon as possible using a single prefilled syringe for manual use.  What may interact with this medicine?  Interactions have not been studied.  Give your health care provider a list of all the medicines, herbs, non-prescription drugs, or dietary supplements you use. Also tell them if you smoke, drink alcohol, or use illegal drugs. Some items may interact with your medicine.  This list may not describe all possible interactions. Give your health care provider a list of all the medicines, herbs, non-prescription drugs, or dietary supplements you use. Also tell them if you smoke, drink alcohol, or use illegal drugs. Some items may interact with your medicine.  What should I watch for while using this medicine?  You may need blood work done while you are taking this medicine.  If you are going to need a MRI, CT scan, or other procedure, tell your doctor that you are using this medicine (On-Body Injector only).  What side effects may I notice from receiving this medicine?  Side effects that you should report to   your doctor or health care professional as soon as possible:  -allergic reactions like skin rash, itching or hives, swelling of the face, lips, or tongue  -back pain  -dizziness  -fever  -pain, redness, or irritation at site where injected  -pinpoint red spots on the skin  -red or dark-brown urine  -shortness of breath or breathing problems  -stomach or side pain, or pain at the shoulder  -swelling  -tiredness  -trouble passing urine or  change in the amount of urine  Side effects that usually do not require medical attention (report to your doctor or health care professional if they continue or are bothersome):  -bone pain  -muscle pain  This list may not describe all possible side effects. Call your doctor for medical advice about side effects. You may report side effects to FDA at 1-800-FDA-1088.  Where should I keep my medicine?  Keep out of the reach of children.  If you are using this medicine at home, you will be instructed on how to store it. Throw away any unused medicine after the expiration date on the label.  NOTE: This sheet is a summary. It may not cover all possible information. If you have questions about this medicine, talk to your doctor, pharmacist, or health care provider.   2019 Elsevier/Gold Standard (2018-02-02 16:57:08)

## 2018-11-02 ENCOUNTER — Telehealth: Payer: Self-pay | Admitting: *Deleted

## 2018-11-02 ENCOUNTER — Telehealth: Payer: Self-pay | Admitting: Oncology

## 2018-11-02 ENCOUNTER — Other Ambulatory Visit: Payer: Self-pay | Admitting: Emergency Medicine

## 2018-11-02 DIAGNOSIS — C50811 Malignant neoplasm of overlapping sites of right female breast: Secondary | ICD-10-CM

## 2018-11-02 DIAGNOSIS — Z170 Estrogen receptor positive status [ER+]: Principal | ICD-10-CM

## 2018-11-02 DIAGNOSIS — R5383 Other fatigue: Secondary | ICD-10-CM

## 2018-11-02 NOTE — Telephone Encounter (Signed)
Called regarding 1/17 per MD ok to schedule Mendel Ryder at [facility]

## 2018-11-02 NOTE — Telephone Encounter (Signed)
This RN spoke with pt's daughter, [name], at 315 pm today. She called due to concern with " increased fatigue and sluggish "  [name] is inquiring if symptoms could be relating to the injection she received on Friday Wylie Hail) due to new symptoms not occurring post prior therapies.  Patient is able to manage ADL's, " but is sleeping a lot more and just overall more sluggish "  Per inquiry pt denies ( she was sitting with daughter during call and answering questions ) any fevers. She states normal bowel and bladder habits.  Her hydration is decreased " we are working on that ". Appetite is well.  She denies any pain .  This RN discussed above with plan due to concerns and pending holding- is for pt to come in tomorrow to [facility] for lab and evaluation.  Appointment request sent.  No further needs at this time.

## 2018-11-03 ENCOUNTER — Inpatient Hospital Stay: Payer: Medicare Other

## 2018-11-03 ENCOUNTER — Inpatient Hospital Stay (HOSPITAL_BASED_OUTPATIENT_CLINIC_OR_DEPARTMENT_OTHER): Payer: Medicare Other | Admitting: Medical

## 2018-11-03 VITALS — BP 156/57 | HR 111 | Temp 98.9°F | Resp 22 | Ht 66.0 in | Wt 176.0 lb

## 2018-11-03 DIAGNOSIS — C50911 Malignant neoplasm of unspecified site of right female breast: Secondary | ICD-10-CM

## 2018-11-03 DIAGNOSIS — C50811 Malignant neoplasm of overlapping sites of right female breast: Secondary | ICD-10-CM

## 2018-11-03 DIAGNOSIS — T451X5A Adverse effect of antineoplastic and immunosuppressive drugs, initial encounter: Secondary | ICD-10-CM

## 2018-11-03 DIAGNOSIS — R5383 Other fatigue: Secondary | ICD-10-CM

## 2018-11-03 DIAGNOSIS — D701 Agranulocytosis secondary to cancer chemotherapy: Secondary | ICD-10-CM

## 2018-11-03 DIAGNOSIS — Z170 Estrogen receptor positive status [ER+]: Secondary | ICD-10-CM | POA: Diagnosis not present

## 2018-11-03 DIAGNOSIS — Z5189 Encounter for other specified aftercare: Secondary | ICD-10-CM | POA: Diagnosis not present

## 2018-11-03 LAB — CBC WITH DIFFERENTIAL (CANCER CENTER ONLY)
Abs Immature Granulocytes: 1.8 10*3/uL — ABNORMAL HIGH (ref 0.00–0.07)
Band Neutrophils: 8 %
Basophils Absolute: 0 10*3/uL (ref 0.0–0.1)
Basophils Relative: 0 %
Eosinophils Absolute: 0.9 10*3/uL — ABNORMAL HIGH (ref 0.0–0.5)
Eosinophils Relative: 2 %
HCT: 35.7 % — ABNORMAL LOW (ref 36.0–46.0)
Hemoglobin: 10.8 g/dL — ABNORMAL LOW (ref 12.0–15.0)
LYMPHS ABS: 1.8 10*3/uL (ref 0.7–4.0)
Lymphocytes Relative: 4 %
MCH: 25.2 pg — ABNORMAL LOW (ref 26.0–34.0)
MCHC: 30.3 g/dL (ref 30.0–36.0)
MCV: 83.2 fL (ref 80.0–100.0)
METAMYELOCYTES PCT: 1 %
Monocytes Absolute: 3.6 10*3/uL — ABNORMAL HIGH (ref 0.1–1.0)
Monocytes Relative: 8 %
Myelocytes: 3 %
NEUTROS ABS: 37.3 10*3/uL — AB (ref 1.7–17.7)
Neutrophils Relative %: 74 %
Platelet Count: 150 10*3/uL (ref 150–400)
RBC: 4.29 MIL/uL (ref 3.87–5.11)
RDW: 16.7 % — ABNORMAL HIGH (ref 11.5–15.5)
WBC Count: 45.5 10*3/uL — ABNORMAL HIGH (ref 4.0–10.5)
nRBC: 0.1 % (ref 0.0–0.2)

## 2018-11-03 LAB — CMP (CANCER CENTER ONLY)
ALT: 48 U/L — ABNORMAL HIGH (ref 0–44)
AST: 32 U/L (ref 15–41)
Albumin: 3.7 g/dL (ref 3.5–5.0)
Alkaline Phosphatase: 183 U/L — ABNORMAL HIGH (ref 38–126)
Anion gap: 11 (ref 5–15)
BUN: 16 mg/dL (ref 8–23)
CO2: 25 mmol/L (ref 22–32)
Calcium: 9 mg/dL (ref 8.9–10.3)
Chloride: 106 mmol/L (ref 98–111)
Creatinine: 1.11 mg/dL — ABNORMAL HIGH (ref 0.44–1.00)
GFR, Est AFR Am: 58 mL/min — ABNORMAL LOW (ref >60)
GFR, Estimated: 50 mL/min — ABNORMAL LOW (ref >60)
Glucose, Bld: 141 mg/dL — ABNORMAL HIGH (ref 70–99)
Potassium: 3.3 mmol/L — ABNORMAL LOW (ref 3.5–5.1)
SODIUM: 142 mmol/L (ref 135–145)
Total Bilirubin: 0.5 mg/dL (ref 0.3–1.2)
Total Protein: 7 g/dL (ref 6.5–8.1)

## 2018-11-03 MED ORDER — POTASSIUM CHLORIDE CRYS ER 20 MEQ PO TBCR: 20.0000 meq | EXTENDED_RELEASE_TABLET | Freq: Every day | ORAL | 0 refills | Status: DC

## 2018-11-03 MED ORDER — MIRTAZAPINE 7.5 MG PO TABS: 7.5000 mg | ORAL_TABLET | Freq: Every day | ORAL | 2 refills | Status: DC

## 2018-11-03 MED FILL — POTASSIUM CHLORIDE CRYS ER: 20 | 30 days supply | Qty: 30 | Fill #0

## 2018-11-03 MED FILL — MIRTAZAPINE 7.5 MG TABLET: 7.5 | 30 days supply | Qty: 30 | Fill #0

## 2018-11-03 NOTE — Patient Instructions (Signed)

## 2018-11-05 ENCOUNTER — Telehealth: Payer: Self-pay | Admitting: Medical

## 2018-11-05 NOTE — Telephone Encounter (Signed)
No los per 12/24. °

## 2018-11-06 ENCOUNTER — Other Ambulatory Visit: Payer: Medicare Other

## 2018-11-06 ENCOUNTER — Ambulatory Visit: Payer: Medicare Other | Admitting: Adult Health

## 2018-11-06 NOTE — Progress Notes (Signed)
[facility] Progress Note   [name] [name] [id number] October 12, 1946 72 y.o.  [name] [name] is managed by Dr. Tressa Busman  Actively treated with chemotherapy/immunotherapy/hormonal therapy: Yes  Current Therapy: Cytoxan, Methotrexate, and Fluorouracil (CMF)  Last Treated: 10/09/18 (cycle 2 day 1)  Assessment: Plan:    Inflammatory breast cancer, right (HCC)  Chemotherapy-induced neutropenia (HCC)    1) ER positive inflammatory breast cancer: The patient is s/p cycle 2 day 1 of CMF which was dosed on 10/09/18.  She is scheduled for her next infusion on 11/27/18.  2) Neutropenia: A CBC from 10/30/18 returned with a  WBC of 2.6 and an ANC of 0.7.  She was dosed with Udenyca on 10/30/18.   Please see After Visit Summary for patient specific instructions.  Future Appointments  Date Time Provider [place]  11/27/2018 10:45 AM [facility] LAB 2 [facility] None  11/27/2018 11:00 AM [facility] [place] FLUSH [facility] None  11/27/2018 11:30 AM Causey, Charlestine Massed, NP [facility] None  11/27/2018  1:00 PM [facility] INFUSION [facility] None  12/18/2018  8:15 AM [facility] LAB 3 [facility] None  12/18/2018  8:30 AM [facility] [place] FLUSH [facility] None  12/18/2018  9:00 AM Magrinat, [name], MD [facility] None  12/18/2018 10:15 AM [facility] INFUSION [facility] None  01/08/2019  8:15 AM [facility] LAB 4 [facility] None  01/08/2019  8:30 AM [facility] [place] FLUSH [facility] None  01/08/2019  9:00 AM Magrinat, [name], MD [facility] None  01/08/2019 10:15 AM [facility] INFUSION [facility] None    No orders of the defined types were placed in this encounter.      Subjective:   Patient ID:  [name] [name] is a 72 y.o. (DOB 15-Mar-1946) female.  Chief Complaint:  Chief Complaint  Patient presents with  . Fatigue    HPI [name] is a 72 y.o. female with an ER positive inflammatory breast cancer who is managed by Dr. Jana Hakim and is s/p cycle 2 day 1 of CMF  dosed on 10/09/18.  She was scheduled for chemotherapy on 10/30/18 but was found to be neutropenic.  She was dosed with Udenyca.  This was the first time that she received Udenyca.  She reports weakness and fatigue since her shot.  She has otherwise done well.  Pt denies nausea, vomiting, diarrhea, CP, or SOB and is currently afebrile.   Medications: I have reviewed the patient's current medications.  Allergies: No Known Allergies  Past Medical History:  Diagnosis Date  . Anxiety   . Cancer [facility])    breast cancer- right  . Carotid stenosis   . Depression    situational  . Dyspnea    with much ambulation  . GERD (gastroesophageal reflux disease)   . Hypertension   . Stroke ([place]) 04/01/2017   a little weakness right side leg and hand, a little memry loss    Past Surgical History:  Procedure Laterality Date  . ABDOMINAL HYSTERECTOMY    . ENDARTERECTOMY Left 04/08/2017   Procedure: ENDARTERECTOMY CAROTID;  Surgeon: Waynetta Sandy, MD;  Location: [place];  Service: Vascular;  Laterality: Left;  . PATCH ANGIOPLASTY Left 04/08/2017   Procedure: PATCH ANGIOPLASTY Left Carotid;  Surgeon: Waynetta Sandy, MD;  Location: [place];  Service: Vascular;  Laterality: Left;  . PORTACATH PLACEMENT Left 09/07/2018   Procedure: INSERTION PORT-A-CATH LEFT INTERNAL JUGULAR;  Surgeon: [name], MD;  Location: [facility] OR;  Service: General;  Laterality: Left;    Family History  Problem Relation Age of Onset  .  Cancer Mother        ovarian    Social History   Socioeconomic History  . Marital status: Divorced    Spouse name: Not on file  . Number of children: Not on file  . Years of education: Not on file  . Highest education level: Not on file  Occupational History  . Not on file  Social Needs  . Financial resource strain: Not on file  . Food insecurity:    Worry: Not on file    Inability: Not on file  . Transportation needs:    Medical: Not on file    Non-medical: Not  on file  Tobacco Use  . Smoking status: Never Smoker  . Smokeless tobacco: Never Used  Substance and Sexual Activity  . Alcohol use: No  . Drug use: No  . Sexual activity: Not on file  Lifestyle  . Physical activity:    Days per week: Not on file    Minutes per session: Not on file  . Stress: Not on file  Relationships  . Social connections:    Talks on phone: Not on file    Gets together: Not on file    Attends religious service: Not on file    Active member of club or organization: Not on file    Attends meetings of clubs or organizations: Not on file    Relationship status: Not on file  . Intimate partner violence:    Fear of current or ex partner: Not on file    Emotionally abused: Not on file    Physically abused: Not on file    Forced sexual activity: Not on file  Other Topics Concern  . Not on file  Social History Narrative  . Not on file    Past Medical History, Surgical history, Social history, and Family history were reviewed and updated as appropriate.   Please see review of systems for further details on the patient's review from today.   Review of Systems:  Review of Systems  Constitutional: Positive for fatigue. Negative for chills, diaphoresis and fever.  HENT: Negative for trouble swallowing and voice change.   Respiratory: Negative for cough, chest tightness, shortness of breath and wheezing.   Cardiovascular: Negative for chest pain and palpitations.  Gastrointestinal: Negative for abdominal pain, constipation, diarrhea, nausea and vomiting.  Musculoskeletal: Negative for back pain and myalgias.  Neurological: Positive for weakness. Negative for dizziness, light-headedness and headaches.    Objective:   Physical Exam:  BP (!) 156/57 (BP Location: Left Arm, Patient Position: Sitting) Comment: RN Liza aware of BP.  Pulse (!) 111 Comment: RN Liza aware of pulse.  Temp 98.9 F (37.2 C) (Oral)   Resp (!) 22 Comment: RN Liza aware of respirations.   Ht 5\' 6"  (1.676 m)   Wt 176 lb (79.8 kg)   SpO2 98%   BMI 28.41 kg/m  ECOG: 0  Physical Exam Constitutional:      General: She is not in acute distress.    Appearance: She is not diaphoretic.  HENT:     Head: Normocephalic and atraumatic.  Cardiovascular:     Rate and Rhythm: Normal rate and regular rhythm.     Heart sounds: Normal heart sounds. No murmur. No friction rub. No gallop.   Pulmonary:     Effort: Pulmonary effort is normal. No respiratory distress.     Breath sounds: Normal breath sounds. No wheezing or rales.  Skin:    General: Skin is warm and  dry.     Findings: No erythema or rash.  Neurological:     General: No focal deficit present.     Mental Status: She is alert.     Coordination: Coordination normal.  Psychiatric:        Mood and Affect: Mood normal.        Behavior: Behavior normal.        Thought Content: Thought content normal.        Judgment: Judgment normal.     Lab Review:     Component Value Date/Time   NA 142 11/03/2018 1017   K 3.3 (L) 11/03/2018 1017   CL 106 11/03/2018 1017   CO2 25 11/03/2018 1017   GLUCOSE 141 (H) 11/03/2018 1017   BUN 16 11/03/2018 1017   CREATININE 1.11 (H) 11/03/2018 1017   CALCIUM 9.0 11/03/2018 1017   PROT 7.0 11/03/2018 1017   ALBUMIN 3.7 11/03/2018 1017   AST 32 11/03/2018 1017   ALT 48 (H) 11/03/2018 1017   ALKPHOS 183 (H) 11/03/2018 1017   BILITOT 0.5 11/03/2018 1017   GFRNONAA 50 (L) 11/03/2018 1017   GFRAA 58 (L) 11/03/2018 1017       Component Value Date/Time   WBC 45.5 (H) 11/03/2018 1017   WBC 2.6 (L) 10/30/2018 0834   RBC 4.29 11/03/2018 1017   HGB 10.8 (L) 11/03/2018 1017   HCT 35.7 (L) 11/03/2018 1017   PLT 150 11/03/2018 1017   MCV 83.2 11/03/2018 1017   MCH 25.2 (L) 11/03/2018 1017   MCHC 30.3 11/03/2018 1017   RDW 16.7 (H) 11/03/2018 1017   LYMPHSABS 1.8 11/03/2018 1017   MONOABS 3.6 (H) 11/03/2018 1017   EOSABS 0.9 (H) 11/03/2018 1017   BASOSABS 0.0 11/03/2018 1017    -------------------------------  Imaging from last 24 hours (if applicable):  Radiology interpretation: No results found.

## 2018-11-18 ENCOUNTER — Telehealth: Payer: Self-pay

## 2018-11-18 NOTE — Telephone Encounter (Signed)
Returned patient's daughter's phone call regarding re-scheduling appointment.  Daughter notified that scheduling message will be sent and she will be contacted for rescheduling.  Voiced understanding, no further needs at this time.

## 2018-11-20 ENCOUNTER — Telehealth: Payer: Self-pay | Admitting: *Deleted

## 2018-11-20 ENCOUNTER — Telehealth: Payer: Self-pay | Admitting: Adult Health

## 2018-11-20 NOTE — Telephone Encounter (Signed)
R/s appt per 1/08 sch message - unable to reach daughter - left message with new appt date and time

## 2018-11-26 ENCOUNTER — Ambulatory Visit: Payer: Medicare Other | Admitting: Adult Health

## 2018-11-26 ENCOUNTER — Other Ambulatory Visit: Payer: Medicare Other

## 2018-11-26 ENCOUNTER — Ambulatory Visit: Payer: Medicare Other

## 2018-11-27 ENCOUNTER — Other Ambulatory Visit: Payer: Medicare Other

## 2018-11-27 ENCOUNTER — Ambulatory Visit: Payer: Medicare Other | Admitting: Adult Health

## 2018-11-27 ENCOUNTER — Ambulatory Visit: Payer: Medicare Other

## 2018-11-30 ENCOUNTER — Inpatient Hospital Stay (HOSPITAL_BASED_OUTPATIENT_CLINIC_OR_DEPARTMENT_OTHER): Payer: Medicare Other | Admitting: Hematology and Oncology

## 2018-11-30 ENCOUNTER — Inpatient Hospital Stay: Payer: Medicare Other | Attending: Oncology

## 2018-11-30 ENCOUNTER — Inpatient Hospital Stay: Payer: Medicare Other

## 2018-11-30 VITALS — BP 135/61 | HR 100 | Resp 18

## 2018-11-30 DIAGNOSIS — I10 Essential (primary) hypertension: Secondary | ICD-10-CM | POA: Insufficient documentation

## 2018-11-30 DIAGNOSIS — I69359 Hemiplegia and hemiparesis following cerebral infarction affecting unspecified side: Secondary | ICD-10-CM

## 2018-11-30 DIAGNOSIS — E876 Hypokalemia: Secondary | ICD-10-CM | POA: Diagnosis not present

## 2018-11-30 DIAGNOSIS — C50811 Malignant neoplasm of overlapping sites of right female breast: Secondary | ICD-10-CM

## 2018-11-30 DIAGNOSIS — Z170 Estrogen receptor positive status [ER+]: Secondary | ICD-10-CM

## 2018-11-30 DIAGNOSIS — C50911 Malignant neoplasm of unspecified site of right female breast: Secondary | ICD-10-CM

## 2018-11-30 DIAGNOSIS — D649 Anemia, unspecified: Secondary | ICD-10-CM | POA: Insufficient documentation

## 2018-11-30 DIAGNOSIS — I63132 Cerebral infarction due to embolism of left carotid artery: Secondary | ICD-10-CM

## 2018-11-30 DIAGNOSIS — Z5111 Encounter for antineoplastic chemotherapy: Secondary | ICD-10-CM | POA: Diagnosis not present

## 2018-11-30 DIAGNOSIS — Z95828 Presence of other vascular implants and grafts: Secondary | ICD-10-CM

## 2018-11-30 DIAGNOSIS — Z5189 Encounter for other specified aftercare: Secondary | ICD-10-CM | POA: Insufficient documentation

## 2018-11-30 LAB — COMPREHENSIVE METABOLIC PANEL
ALT: 23 U/L (ref 0–44)
ANION GAP: 9 (ref 5–15)
AST: 14 U/L — ABNORMAL LOW (ref 15–41)
Albumin: 3.8 g/dL (ref 3.5–5.0)
Alkaline Phosphatase: 103 U/L (ref 38–126)
BUN: 16 mg/dL (ref 8–23)
CO2: 26 mmol/L (ref 22–32)
Calcium: 9.2 mg/dL (ref 8.9–10.3)
Chloride: 108 mmol/L (ref 98–111)
Creatinine, Ser: 0.87 mg/dL (ref 0.44–1.00)
GFR calc Af Amer: >60 mL/min (ref >60)
GFR calc non Af Amer: >60 mL/min (ref >60)
Glucose, Bld: 116 mg/dL — ABNORMAL HIGH (ref 70–99)
Potassium: 3.4 mmol/L — ABNORMAL LOW (ref 3.5–5.1)
Sodium: 143 mmol/L (ref 135–145)
Total Bilirubin: 0.4 mg/dL (ref 0.3–1.2)
Total Protein: 7.3 g/dL (ref 6.5–8.1)

## 2018-11-30 LAB — CBC WITH DIFFERENTIAL/PLATELET
Abs Immature Granulocytes: 0.01 10*3/uL (ref 0.00–0.07)
BASOS ABS: 0.1 10*3/uL (ref 0.0–0.1)
Basophils Relative: 1 %
Eosinophils Absolute: 0.3 10*3/uL (ref 0.0–0.5)
Eosinophils Relative: 6 %
HEMATOCRIT: 35.2 % — AB (ref 36.0–46.0)
Hemoglobin: 11 g/dL — ABNORMAL LOW (ref 12.0–15.0)
Immature Granulocytes: 0 %
LYMPHS PCT: 25 %
Lymphs Abs: 1.2 10*3/uL (ref 0.7–4.0)
MCH: 25.6 pg — ABNORMAL LOW (ref 26.0–34.0)
MCHC: 31.3 g/dL (ref 30.0–36.0)
MCV: 82.1 fL (ref 80.0–100.0)
Monocytes Absolute: 0.6 10*3/uL (ref 0.1–1.0)
Monocytes Relative: 12 %
Neutro Abs: 2.6 10*3/uL (ref 1.7–7.7)
Neutrophils Relative %: 56 %
Platelets: 223 10*3/uL (ref 150–400)
RBC: 4.29 MIL/uL (ref 3.87–5.11)
RDW: 15.8 % — ABNORMAL HIGH (ref 11.5–15.5)
WBC: 4.7 10*3/uL (ref 4.0–10.5)
nRBC: 0 % (ref 0.0–0.2)

## 2018-11-30 MED ORDER — SODIUM CHLORIDE 0.9 % IV SOLN
600.0000 mg/m2 | Freq: Once | INTRAVENOUS | Status: AC
  Administered 2018-11-30: 1140 mg via INTRAVENOUS
  Filled 2018-11-30: qty 57

## 2018-11-30 MED ORDER — FLUOROURACIL CHEMO INJECTION 2.5 GM/50ML
600.0000 mg/m2 | Freq: Once | INTRAVENOUS | Status: AC
  Administered 2018-11-30: 1150 mg via INTRAVENOUS
  Filled 2018-11-30: qty 23

## 2018-11-30 MED ORDER — SODIUM CHLORIDE 0.9% FLUSH
10.0000 mL | INTRAVENOUS | Status: DC | PRN
  Administered 2018-11-30: 10 mL
  Filled 2018-11-30: qty 10

## 2018-11-30 MED ORDER — DEXAMETHASONE SODIUM PHOSPHATE 10 MG/ML IJ SOLN
INTRAMUSCULAR | Status: AC
  Filled 2018-11-30: qty 1

## 2018-11-30 MED ORDER — DEXAMETHASONE SODIUM PHOSPHATE 10 MG/ML IJ SOLN
10.0000 mg | Freq: Once | INTRAMUSCULAR | Status: AC
  Administered 2018-11-30: 10 mg via INTRAVENOUS

## 2018-11-30 MED ORDER — METHOTREXATE SODIUM (PF) CHEMO INJECTION 250 MG/10ML
39.7500 mg/m2 | Freq: Once | INTRAMUSCULAR | Status: AC
  Administered 2018-11-30: 76 mg via INTRAVENOUS
  Filled 2018-11-30: qty 3.04

## 2018-11-30 MED ORDER — PALONOSETRON HCL INJECTION 0.25 MG/5ML
INTRAVENOUS | Status: AC
  Filled 2018-11-30: qty 5

## 2018-11-30 MED ORDER — SODIUM CHLORIDE 0.9 % IV SOLN
Freq: Once | INTRAVENOUS | Status: AC
  Administered 2018-11-30: 09:00:00 via INTRAVENOUS
  Filled 2018-11-30: qty 250

## 2018-11-30 MED ORDER — HEPARIN SOD (PORK) LOCK FLUSH 100 UNIT/ML IV SOLN
500.0000 [IU] | Freq: Once | INTRAVENOUS | Status: AC | PRN
  Administered 2018-11-30: 500 [IU]
  Filled 2018-11-30: qty 5

## 2018-11-30 MED ORDER — PALONOSETRON HCL INJECTION 0.25 MG/5ML
0.2500 mg | Freq: Once | INTRAVENOUS | Status: AC
  Administered 2018-11-30: 0.25 mg via INTRAVENOUS

## 2018-11-30 NOTE — Patient Instructions (Signed)
[facility] Discharge Instructions for Patients Receiving Chemotherapy  Today you received the following chemotherapy agents:  Cytoxan, Methotrexate, Fluorouracil  To help prevent nausea and vomiting after your treatment, we encourage you to take your nausea medication as prescribed. * NO ZOFRAN FOR 3 DAYS AFTER CHEMO *    If you develop nausea and vomiting that is not controlled by your nausea medication, call the clinic.   BELOW ARE SYMPTOMS THAT SHOULD BE REPORTED IMMEDIATELY:  *FEVER GREATER THAN 100.5 F  *CHILLS WITH OR WITHOUT FEVER  NAUSEA AND VOMITING THAT IS NOT CONTROLLED WITH YOUR NAUSEA MEDICATION  *UNUSUAL SHORTNESS OF BREATH  *UNUSUAL BRUISING OR BLEEDING  TENDERNESS IN MOUTH AND THROAT WITH OR WITHOUT PRESENCE OF ULCERS  *URINARY PROBLEMS  *BOWEL PROBLEMS  UNUSUAL RASH Items with * indicate a potential emergency and should be followed up as soon as possible.  Feel free to call the clinic should you have any questions or concerns. The clinic phone number is [phone].  Please show the [place] at check-in to the Emergency Department and triage nurse.

## 2018-11-30 NOTE — Assessment & Plan Note (Signed)
ASSESSMENT: 73 y.o. [place], [place] woman status post right breast biopsy of overlapping sites on 08/19/2018 for a clinical T4 N1, stage IIIB invasive lobular carcinoma, estrogen receptor positive, progesterone receptor negative, HER-2 not amplified, with an MIB-1 of 20%.             (a) CT chest/abd/pelvis 09/02/2018 and bone scan 09/10/2018 show no evidence of stage IV disease             (b) breast MRI 09/14/2018 confirms a multicentric 12.6 cm right breast mass infiltrating pectpralis but w/o associated adenopathy  (1) genetics testing pending  (2) neoadjuvant chemotherapy to consist of cyclophosphamide, methotrexate and fluorouracil every 21 days x 8,  begin 09/16/2018  (3) definitive surgery to follow  (4) adjuvant radiation  (5) antiestrogens therapy at the completion of local treatment  (6) foundation 1/PD-L1 testing  PLAN: Cycle 3 CMF Chemo toxicities: No major adverse effects from chemotherapy. Monitoring closely for toxicities Labs were reviewed Dr. Jana Hakim plans to reassess with the next chemo and decide if a change in treatment is warranted.

## 2018-11-30 NOTE — Progress Notes (Signed)
Patient Care Team: [name], MD as PCP - General (Internal Medicine) Waynetta Sandy, MD as Consulting Physician (Vascular Surgery) Patsey Berthold, NP as Nurse Practitioner (Cardiology) [name], MD as Consulting Physician (Physical Medicine and Rehabilitation) [name], [name], NP as Nurse Practitioner (Vascular Surgery) Edgardo Roys, PsyD as Consulting Physician (Psychology) Magrinat, [name], MD as Consulting Physician (Oncology) [name], MD as Consulting Physician (General Surgery) Eppie Gibson, MD as Attending Physician (Radiation Oncology)  DIAGNOSIS:  Encounter Diagnosis  Name Primary?  . Malignant neoplasm of overlapping sites of right breast in female, estrogen receptor positive ([place])     SUMMARY OF ONCOLOGIC HISTORY:   Malignant neoplasm of overlapping sites of right breast in female, estrogen receptor positive ([place])   08/24/2018 Initial Diagnosis    Malignant neoplasm of overlapping sites of right breast in female, estrogen receptor positive ([place])    09/16/2018 -  Chemotherapy    The patient had palonosetron (ALOXI) injection 0.25 mg, 0.25 mg, Intravenous,  Once, 2 of 8 cycles Administration: 0.25 mg (09/16/2018), 0.25 mg (10/09/2018) methotrexate (PF) chemo injection 76 mg, 39.8 mg/m2 = 76.5 mg, Intravenous,  Once, 2 of 8 cycles Administration: 76 mg (09/16/2018), 76 mg (10/09/2018) pegfilgrastim-cbqv (UDENYCA) injection 6 mg, 6 mg, Subcutaneous, Once, 0 of 6 cycles cyclophosphamide (CYTOXAN) 1,140 mg in sodium chloride 0.9 % 250 mL chemo infusion, 600 mg/m2 = 1,140 mg, Intravenous,  Once, 2 of 8 cycles Administration: 1,140 mg (09/16/2018), 1,140 mg (10/09/2018) fluorouracil (ADRUCIL) chemo injection 1,150 mg, 600 mg/m2 = 1,150 mg, Intravenous,  Once, 2 of 8 cycles Administration: 1,150 mg (09/16/2018), 1,150 mg (10/09/2018)  for chemotherapy treatment.      Inflammatory breast cancer, right ([place])   08/26/2018 Initial  Diagnosis    Inflammatory breast cancer, right ([place])    09/16/2018 -  Chemotherapy    The patient had palonosetron (ALOXI) injection 0.25 mg, 0.25 mg, Intravenous,  Once, 2 of 8 cycles Administration: 0.25 mg (09/16/2018), 0.25 mg (10/09/2018) methotrexate (PF) chemo injection 76 mg, 39.8 mg/m2 = 76.5 mg, Intravenous,  Once, 2 of 8 cycles Administration: 76 mg (09/16/2018), 76 mg (10/09/2018) pegfilgrastim-cbqv (UDENYCA) injection 6 mg, 6 mg, Subcutaneous, Once, 0 of 6 cycles cyclophosphamide (CYTOXAN) 1,140 mg in sodium chloride 0.9 % 250 mL chemo infusion, 600 mg/m2 = 1,140 mg, Intravenous,  Once, 2 of 8 cycles Administration: 1,140 mg (09/16/2018), 1,140 mg (10/09/2018) fluorouracil (ADRUCIL) chemo injection 1,150 mg, 600 mg/m2 = 1,150 mg, Intravenous,  Once, 2 of 8 cycles Administration: 1,150 mg (09/16/2018), 1,150 mg (10/09/2018)  for chemotherapy treatment.      CHIEF COMPLIANT: Cycle 3 CMF  INTERVAL HISTORY: [name] [name] is a 73 year old above-mentioned history of right breast cancer currently on CMF chemotherapy and today is cycle 3.  Left likely was given in November and she did not get any chemotherapy in December because of neutropenia.  She received Udenyca and that brought her numbers up.  She has not noticed any major change in the breast.  Dr. Jana Hakim is contemplating on reevaluation after 3 rounds of chemo to decide if she needs any change in treatment.  She has felt somewhat fatigued after the Udenyca injection but recovered her energy levels.  She has been gaining some weight.  She denies any nausea or vomiting.  Denies any diarrhea or constipation.  Denies any fevers or chills  REVIEW OF SYSTEMS:   Constitutional: Denies fevers, chills or abnormal weight loss Eyes: Denies blurriness of vision Ears, nose, mouth,  throat, and face: Denies mucositis or sore throat Respiratory: Denies cough, dyspnea or wheezes Cardiovascular: Denies palpitation, chest  discomfort Gastrointestinal:  Denies nausea, heartburn or change in bowel habits Skin: Denies abnormal skin rashes Lymphatics: Denies new lymphadenopathy or easy bruising Neurological:Denies numbness, tingling or new weaknesses Behavioral/Psych: Mood is stable, no new changes  Extremities: No lower extremity edema Breast: Inflammatory breast cancer All other systems were reviewed with the patient and are negative.  I have reviewed the past medical history, past surgical history, social history and family history with the patient and they are unchanged from previous note.  ALLERGIES:  has No Known Allergies.  MEDICATIONS:  Current Outpatient Medications  Medication Sig Dispense Refill  . amLODipine (NORVASC) 10 MG tablet Take 1 tablet (10 mg total) by mouth daily. PATIENT MUST HAVE OFFICE VISIT AND LABS PRIOR TO ANY FURTHER REFILLS 15 tablet 0  . atorvastatin (LIPITOR) 80 MG tablet Take 1 tablet (80 mg total) by mouth daily at 6 PM. 30 tablet 0  . clopidogrel (PLAVIX) 75 MG tablet TAKE 1 TABLET BY MOUTH EVERY DAY 30 tablet 1  . lidocaine-prilocaine (EMLA) cream Apply to affected area once 30 g 3  . lisinopril-hydrochlorothiazide (PRINZIDE,ZESTORETIC) 20-12.5 MG tablet Take 1 tablet by mouth daily.  11  . loratadine (CLARITIN) 10 MG tablet Take 10 mg by mouth daily as needed for allergies.    . potassium chloride SA (K-DUR,KLOR-CON) 20 MEQ tablet Take 1 tablet (20 mEq total) by mouth daily. 30 tablet 0  . prochlorperazine (COMPAZINE) 10 MG tablet Take 1 tablet (10 mg total) by mouth every 6 (six) hours as needed (Nausea or vomiting). 30 tablet 1   No current facility-administered medications for this visit.    Facility-Administered Medications Ordered in Other Visits  Medication Dose Route Frequency Provider Last Rate Last Dose  . heparin lock flush 100 unit/mL  500 Units Intracatheter Once PRN Magrinat, [name], MD      . sodium chloride flush (NS) 0.9 % injection 10 mL  10 mL  Intracatheter PRN Magrinat, [name], MD      . sodium chloride flush (NS) 0.9 % injection 10 mL  10 mL Intracatheter PRN Magrinat, [name], MD   10 mL at 11/30/18 0831    PHYSICAL EXAMINATION: ECOG PERFORMANCE STATUS: 1 - Symptomatic but completely ambulatory  Vitals:   11/30/18 0840  BP: (!) 177/68  Pulse: (!) 104  Resp: 19  Temp: 98.7 F (37.1 C)  SpO2: 100%   Filed Weights   11/30/18 0840  Weight: 179 lb 1.6 oz (81.2 kg)    GENERAL:alert, no distress and comfortable SKIN: skin color, texture, turgor are normal, no rashes or significant lesions EYES: normal, Conjunctiva are pink and non-injected, sclera clear OROPHARYNX:no exudate, no erythema and lips, buccal mucosa, and tongue normal  NECK: supple, thyroid normal size, non-tender, without nodularity LYMPH:  no palpable lymphadenopathy in the cervical, axillary or inguinal LUNGS: clear to auscultation and percussion with normal breathing effort HEART: regular rate & rhythm and no murmurs and no lower extremity edema ABDOMEN:abdomen soft, non-tender and normal bowel sounds MUSCULOSKELETAL:no cyanosis of digits and no clubbing  NEURO: alert & oriented x 3 with fluent speech, no focal motor/sensory deficits EXTREMITIES: No lower extremity edema  LABORATORY DATA:  I have reviewed the data as listed CMP Latest Ref Rng & Units 11/03/2018 10/30/2018 10/19/2018  Glucose 70 - 99 mg/dL 141(H) 118(H) 114(H)  BUN 8 - 23 mg/dL _0 Creatinine 0.44 -  1.00 mg/dL 1.11(H) 0.84 0.92  Sodium 135 - 145 mmol/L 142 142 144  Potassium 3.5 - 5.1 mmol/L 3.3(L) 3.5 3.6  Chloride 98 - 111 mmol/L 106 106 107  CO2 22 - 32 mmol/L _0 Calcium 8.9 - 10.3 mg/dL 9.0 9.5 9.3  Total Protein 6.5 - 8.1 g/dL 7.0 7.6 7.3  Total Bilirubin 0.3 - 1.2 mg/dL 0.5 0.4 0.4  Alkaline Phos 38 - 126 U/L 183(H) 105 102  AST 15 - 41 U/L 32 21 19  ALT 0 - 44 U/L 48(H) 37 32    Lab Results  Component Value Date   WBC 4.7 11/30/2018   HGB 11.0 (L)  11/30/2018   HCT 35.2 (L) 11/30/2018   MCV 82.1 11/30/2018   PLT 223 11/30/2018   NEUTROABS 2.6 11/30/2018    ASSESSMENT & PLAN:  Malignant neoplasm of overlapping sites of right breast in female, estrogen receptor positive ([place]) ASSESSMENT: 73 y.o. [place], [place] woman status post right breast biopsy of overlapping sites on 08/19/2018 for a clinical T4 N1, stage IIIB invasive lobular carcinoma, estrogen receptor positive, progesterone receptor negative, HER-2 not amplified, with an MIB-1 of 20%.             (a) CT chest/abd/pelvis 09/02/2018 and bone scan 09/10/2018 show no evidence of stage IV disease             (b) breast MRI 09/14/2018 confirms a multicentric 12.6 cm right breast mass infiltrating pectpralis but w/o associated adenopathy  (1) genetics testing pending  (2) neoadjuvant chemotherapy to consist of cyclophosphamide, methotrexate and fluorouracil every 21 days x 8,  begin 09/16/2018  (3) definitive surgery to follow  (4) adjuvant radiation  (5) antiestrogens therapy at the completion of local treatment  (6) foundation 1/PD-L1 testing  PLAN: Cycle 3 CMF Chemo toxicities: No major adverse effects from chemotherapy. Monitoring closely for toxicities Labs were reviewed 1.  Mild anemia stable 2.  Hypokalemia: Patient is currently off potassium replacement. 3.  Hypertension: Needs to be monitored.  Dr. Jana Hakim plans to reassess with the next chemo and decide if a change in treatment is warranted. I will add an appointment for Udenyca injection on day 3.      No orders of the defined types were placed in this encounter.  The patient has a good understanding of the overall plan. she agrees with it. she will call with any problems that may develop before the next visit here.   Harriette Ohara, MD 11/30/18

## 2018-12-01 ENCOUNTER — Telehealth: Payer: Self-pay | Admitting: Oncology

## 2018-12-01 ENCOUNTER — Telehealth: Payer: Self-pay | Admitting: Hematology and Oncology

## 2018-12-01 NOTE — Telephone Encounter (Signed)
R/s appt per 1/21 sch message - left message for patient with appt date and time

## 2018-12-01 NOTE — Telephone Encounter (Signed)
Called patient per 1/20 sch message - left message for patient to call back if r/s is still needed.

## 2018-12-02 ENCOUNTER — Ambulatory Visit: Payer: Medicare Other

## 2018-12-03 ENCOUNTER — Inpatient Hospital Stay: Payer: Medicare Other

## 2018-12-03 VITALS — BP 155/61 | HR 88 | Temp 98.7°F | Resp 18

## 2018-12-03 DIAGNOSIS — C50811 Malignant neoplasm of overlapping sites of right female breast: Secondary | ICD-10-CM | POA: Diagnosis not present

## 2018-12-03 DIAGNOSIS — I10 Essential (primary) hypertension: Secondary | ICD-10-CM | POA: Diagnosis not present

## 2018-12-03 DIAGNOSIS — E876 Hypokalemia: Secondary | ICD-10-CM | POA: Diagnosis not present

## 2018-12-03 DIAGNOSIS — Z5111 Encounter for antineoplastic chemotherapy: Secondary | ICD-10-CM | POA: Diagnosis not present

## 2018-12-03 DIAGNOSIS — Z95828 Presence of other vascular implants and grafts: Secondary | ICD-10-CM

## 2018-12-03 DIAGNOSIS — Z170 Estrogen receptor positive status [ER+]: Secondary | ICD-10-CM | POA: Diagnosis not present

## 2018-12-03 DIAGNOSIS — D649 Anemia, unspecified: Secondary | ICD-10-CM | POA: Diagnosis not present

## 2018-12-03 MED ORDER — PEGFILGRASTIM-CBQV 6 MG/0.6ML ~~LOC~~ SOSY
PREFILLED_SYRINGE | SUBCUTANEOUS | Status: AC
  Filled 2018-12-03: qty 0.6

## 2018-12-03 MED ORDER — PEGFILGRASTIM-CBQV 6 MG/0.6ML ~~LOC~~ SOSY
6.0000 mg | PREFILLED_SYRINGE | Freq: Once | SUBCUTANEOUS | Status: AC
  Administered 2018-12-03: 6 mg via SUBCUTANEOUS

## 2018-12-03 NOTE — Patient Instructions (Signed)
Pegfilgrastim injection  What is this medicine?  PEGFILGRASTIM (PEG fil gra stim) is a long-acting granulocyte colony-stimulating factor that stimulates the growth of neutrophils, a type of white blood cell important in the body's fight against infection. It is used to reduce the incidence of fever and infection in patients with certain types of cancer who are receiving chemotherapy that affects the bone marrow, and to increase survival after being exposed to high doses of radiation.  This medicine may be used for other purposes; ask your health care provider or pharmacist if you have questions.  COMMON BRAND NAME(S): Fulphila, Neulasta, UDENYCA  What should I tell my health care provider before I take this medicine?  They need to know if you have any of these conditions:  -kidney disease  -latex allergy  -ongoing radiation therapy  -sickle cell disease  -skin reactions to acrylic adhesives (On-Body Injector only)  -an unusual or allergic reaction to pegfilgrastim, filgrastim, other medicines, foods, dyes, or preservatives  -pregnant or trying to get pregnant  -breast-feeding  How should I use this medicine?  This medicine is for injection under the skin. If you get this medicine at home, you will be taught how to prepare and give the pre-filled syringe or how to use the On-body Injector. Refer to the patient Instructions for Use for detailed instructions. Use exactly as directed. Tell your healthcare provider immediately if you suspect that the On-body Injector may not have performed as intended or if you suspect the use of the On-body Injector resulted in a missed or partial dose.  It is important that you put your used needles and syringes in a special sharps container. Do not put them in a trash can. If you do not have a sharps container, call your pharmacist or healthcare provider to get one.  Talk to your pediatrician regarding the use of this medicine in children. While this drug may be prescribed for  selected conditions, precautions do apply.  Overdosage: If you think you have taken too much of this medicine contact a poison control center or emergency room at once.  NOTE: This medicine is only for you. Do not share this medicine with others.  What if I miss a dose?  It is important not to miss your dose. Call your doctor or health care professional if you miss your dose. If you miss a dose due to an On-body Injector failure or leakage, a new dose should be administered as soon as possible using a single prefilled syringe for manual use.  What may interact with this medicine?  Interactions have not been studied.  Give your health care provider a list of all the medicines, herbs, non-prescription drugs, or dietary supplements you use. Also tell them if you smoke, drink alcohol, or use illegal drugs. Some items may interact with your medicine.  This list may not describe all possible interactions. Give your health care provider a list of all the medicines, herbs, non-prescription drugs, or dietary supplements you use. Also tell them if you smoke, drink alcohol, or use illegal drugs. Some items may interact with your medicine.  What should I watch for while using this medicine?  You may need blood work done while you are taking this medicine.  If you are going to need a MRI, CT scan, or other procedure, tell your doctor that you are using this medicine (On-Body Injector only).  What side effects may I notice from receiving this medicine?  Side effects that you should report to   your doctor or health care professional as soon as possible:  -allergic reactions like skin rash, itching or hives, swelling of the face, lips, or tongue  -back pain  -dizziness  -fever  -pain, redness, or irritation at site where injected  -pinpoint red spots on the skin  -red or dark-brown urine  -shortness of breath or breathing problems  -stomach or side pain, or pain at the shoulder  -swelling  -tiredness  -trouble passing urine or  change in the amount of urine  Side effects that usually do not require medical attention (report to your doctor or health care professional if they continue or are bothersome):  -bone pain  -muscle pain  This list may not describe all possible side effects. Call your doctor for medical advice about side effects. You may report side effects to FDA at 1-800-FDA-1088.  Where should I keep my medicine?  Keep out of the reach of children.  If you are using this medicine at home, you will be instructed on how to store it. Throw away any unused medicine after the expiration date on the label.  NOTE: This sheet is a summary. It may not cover all possible information. If you have questions about this medicine, talk to your doctor, pharmacist, or health care provider.   2019 Elsevier/Gold Standard (2018-02-02 16:57:08)

## 2018-12-17 NOTE — Progress Notes (Signed)
[place]  [phone] [phone]     ID: [name] [name] DOB: [birth year]  MR#: [id number]  RFX#:[id number]  Patient Care Team: [name], MD as PCP - General (Internal Medicine) Waynetta Sandy, MD as Consulting Physician (Vascular Surgery) Patsey Berthold, NP as Nurse Practitioner (Cardiology) [name], MD as Consulting Physician (Physical Medicine and Rehabilitation) [name], [name], NP as Nurse Practitioner (Vascular Surgery) Edgardo Roys, PsyD as Consulting Physician (Psychology) Aurthur Wingerter, [name], MD as Consulting Physician (Oncology) [name], MD as Consulting Physician (General Surgery) Eppie Gibson, MD as Attending Physician (Radiation Oncology) OTHER MD:   CHIEF COMPLAINT: Inflammatory breast cancer   CURRENT TREATMENT: Neoadjuvant chemotherapy; starting anastrozole   INTERVAL HISTORY: [name] [name] returns today for follow-up and treatment of her locally advanced breast cancer. She is accompanied by her daughter.  She continues on neoadjuvant chemotherapy consisting of cyclophosphamide, methotrexate and fluorouracil (CMF) every 21 days x 8. Today is day 1 cycle 4. She tolerates this well and without any noticeable side effects.    Since her last visit here, she has not undergone any additional studies.    REVIEW OF SYSTEMS: [name] [name] has not been very active; on a normal day, she might do a few minor chores like folding laundry, but not much else. She believes her breast is a little different, but not much. The patient denies unusual headaches, visual changes, nausea, vomiting, or dizziness. There has been no unusual cough, phlegm production, or pleurisy. This been no change in bowel or bladder habits. The patient denies unexplained fatigue or unexplained weight loss, bleeding, rash, or fever. A detailed review of systems was otherwise noncontributory.    HISTORY OF CURRENT ILLNESS: [name] [name]  presented with right breast discoloration and redness. The right breast also had an area of hardness and nipple retraction.  She did not bring this to medical attention but her daughter did mention it when the patient had neurologic follow-up on 08/10/2018.  At that time the nurse practitioner, [name], noted right nipple inversion, hard breast, and red non-ulcerated lesions on the breast.  The patient then underwent bilateral diagnostic mammography with tomography and right breast ultrasonography at [facility] on 08/11/2018 (this is her first ever mammogram) showing: breast density category B. There was a round 2.5 cm mass at the 1 o'clock upper inner quadrant of the right breast.  By palpation this measured approximately 10 cm.  Sonographically the irregular hypoechoic mass measured 2.7 cm. There was also a hypoechoic mass in the right breast at 9 o'clock upper outer quadrant measuring 3.8 cm. There was an abnormal right axillary lymph node with cortical thickening.   Accordingly on 08/19/2018 she proceeded to biopsy of the right breast areas in question. The pathology from this procedure showed [phone]): At the 1 o'clock: invasive lobular carcinoma, grade II. Prognostic indicators significant for: estrogen receptor, 60% positive with moderate staining intensity and progesterone receptor, 0% negative. Proliferation marker Ki67 at 20%. HER2 negative with an immunohistochemistry of (1+).  At the 9 o'clock: invasive lobular carcinoma, grade II. Prognostic indicators significant for: estrogen receptor, 90% positive with strong staining intensity and progesterone receptor, 0% negative. Proliferation marker Ki67 at 30%. HER2 negative with an immunohistochemistry of (1+).  The patient's subsequent history is as detailed below.    PAST MEDICAL HISTORY: Past Medical History:  Diagnosis Date  . Anxiety   . Cancer [facility])    breast cancer- right  . Carotid stenosis   . Depression  situational  .  Dyspnea    with much ambulation  . GERD (gastroesophageal reflux disease)   . Hypertension   . Stroke ([place]) 04/01/2017   a little weakness right side leg and hand, a little memry loss    PAST SURGICAL HISTORY: Past Surgical History:  Procedure Laterality Date  . ABDOMINAL HYSTERECTOMY    . ENDARTERECTOMY Left 04/08/2017   Procedure: ENDARTERECTOMY CAROTID;  Surgeon: Waynetta Sandy, MD;  Location: [place];  Service: Vascular;  Laterality: Left;  . PATCH ANGIOPLASTY Left 04/08/2017   Procedure: PATCH ANGIOPLASTY Left Carotid;  Surgeon: Waynetta Sandy, MD;  Location: [place];  Service: Vascular;  Laterality: Left;  . PORTACATH PLACEMENT Left 09/07/2018   Procedure: INSERTION PORT-A-CATH LEFT INTERNAL JUGULAR;  Surgeon: [name], MD;  Location: [place];  Service: General;  Laterality: Left;  total hysterectomy with bilateral salpingo-oophorectomy in her 58's no hormone replacement  FAMILY HISTORY Family History  Problem Relation Age of Onset  . Cancer Mother        ovarian  The patient's father is alive at age 72. The patient's mother died at age 39 due to ovarian cancer. The patient had 3 brothers and 3 sisters. She denies a history of breast, pancreatic, colon, or other cancers in the family.   GYNECOLOGIC HISTORY:  No LMP recorded. Patient has had a hysterectomy. Menarche: Age at first live birth: 73 years old She is GX P1.  She is status post total hysterectomy with BSO in her early 38's. She did not use HRT.    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. She is divorced. At home is just herself, but her two grandsons regularly come to visit and stay with her. The patient's daughter, [name] [name] works in administration for [employer]. The patient's 29 y.o. grandson is a Freight forwarder for [employer]. The patient's 9 y.o. grandson works in [employer].     ADVANCED DIRECTIVES: Not in place.  At the 08/26/2018 visit the patient was  given the appropriate documents to complete and notarized at her discretion.   HEALTH MAINTENANCE: Social History   Tobacco Use  . Smoking status: Never Smoker  . Smokeless tobacco: Never Used  Substance Use Topics  . Alcohol use: No  . Drug use: No     Colonoscopy: Never  PAP: Status post hysterectomy  Bone density: Never   No Known Allergies  Current Outpatient Medications  Medication Sig Dispense Refill  . amLODipine (NORVASC) 10 MG tablet Take 1 tablet (10 mg total) by mouth daily. PATIENT MUST HAVE OFFICE VISIT AND LABS PRIOR TO ANY FURTHER REFILLS 15 tablet 0  . atorvastatin (LIPITOR) 80 MG tablet Take 1 tablet (80 mg total) by mouth daily at 6 PM. 30 tablet 0  . clopidogrel (PLAVIX) 75 MG tablet TAKE 1 TABLET BY MOUTH EVERY DAY 30 tablet 1  . lidocaine-prilocaine (EMLA) cream Apply to affected area once 30 g 3  . lisinopril-hydrochlorothiazide (PRINZIDE,ZESTORETIC) 20-12.5 MG tablet Take 1 tablet by mouth daily.  11  . loratadine (CLARITIN) 10 MG tablet Take 10 mg by mouth daily as needed for allergies.    . potassium chloride SA (K-DUR,KLOR-CON) 20 MEQ tablet Take 1 tablet (20 mEq total) by mouth daily. 30 tablet 0  . prochlorperazine (COMPAZINE) 10 MG tablet Take 1 tablet (10 mg total) by mouth every 6 (six) hours as needed (Nausea or vomiting). 30 tablet 1   No current facility-administered medications for this visit.  Facility-Administered Medications Ordered in Other Visits  Medication Dose Route Frequency Provider Last Rate Last Dose  . heparin lock flush 100 unit/mL  500 Units Intracatheter Once PRN Sindee Stucker, [name], MD      . sodium chloride flush (NS) 0.9 % injection 10 mL  10 mL Intracatheter PRN Chima Astorino, [name], MD        OBJECTIVE: Middle-aged African-American woman who appears stated age  Vitals:   12/18/18 0925  BP: (!) 144/75  Pulse: 99  Resp: 18  Temp: 98.7 F (37.1 C)  SpO2: 100%     Body mass index is 29.26 kg/m.   Wt Readings from  Last 3 Encounters:  12/18/18 181 lb 4.8 oz (82.2 kg)  11/30/18 179 lb 1.6 oz (81.2 kg)  11/03/18 176 lb (79.8 kg)      ECOG FS:2 - Symptomatic, <50% confined to bed  Sclerae unicteric, pupils round and equal No cervical or supraclavicular adenopathy Lungs no rales or rhonchi Heart regular rate and rhythm Abd soft, nontender, positive bowel sounds MSK no focal spinal tenderness, no upper extremity lymphedema Neuro: nonfocal, well oriented, appropriate affect Breasts: I am imaging the right breast below.  While there has been no obvious progression, I cannot say that we have had a dramatic change.  The left breast is benign.  Both axillae are benign.  Right Breast 12/18/2018     Right Breast 08/26/2018   09/23/18 Cycle 1 day 8     LAB RESULTS:  CMP     Component Value Date/Time   NA 143 11/30/2018 0812   K 3.4 (L) 11/30/2018 0812   CL 108 11/30/2018 0812   CO2 26 11/30/2018 0812   GLUCOSE 116 (H) 11/30/2018 0812   BUN 16 11/30/2018 0812   CREATININE 0.87 11/30/2018 0812   CREATININE 1.11 (H) 11/03/2018 1017   CALCIUM 9.2 11/30/2018 0812   PROT 7.3 11/30/2018 0812   ALBUMIN 3.8 11/30/2018 0812   AST 14 (L) 11/30/2018 0812   AST 32 11/03/2018 1017   ALT 23 11/30/2018 0812   ALT 48 (H) 11/03/2018 1017   ALKPHOS 103 11/30/2018 0812   BILITOT 0.4 11/30/2018 0812   BILITOT 0.5 11/03/2018 1017   GFRNONAA >60 11/30/2018 0812   GFRNONAA 50 (L) 11/03/2018 1017   GFRAA >60 11/30/2018 0812   GFRAA 58 (L) 11/03/2018 1017    No results found for: TOTALPROTELP, ALBUMINELP, A1GS, A2GS, BETS, BETA2SER, GAMS, MSPIKE, SPEI  No results found for: KPAFRELGTCHN, LAMBDASER, KAPLAMBRATIO  Lab Results  Component Value Date   WBC 6.6 12/18/2018   NEUTROABS 4.7 12/18/2018   HGB 11.0 (L) 12/18/2018   HCT 36.0 12/18/2018   MCV 82.9 12/18/2018   PLT 133 (L) 12/18/2018    _0 @  No results found for: LABCA2  No components found for: [id number]  No results for  input(s): INR in the last 168 hours.  No results found for: LABCA2  No results found for: CAN199  No results found for: MHD622  No results found for: WLN989  Lab Results  Component Value Date   CA2729 54.4 (H) 09/15/2018    No components found for: HGQUANT  No results found for: CEA1 / No results found for: CEA1   No results found for: AFPTUMOR  No results found for: [place]  No results found for: PSA1  Appointment on 12/18/2018  Component Date Value Ref Range Status  . WBC 12/18/2018 6.6  4.0 - 10.5 K/uL Final  . RBC 12/18/2018 4.34  3.87 -  5.11 MIL/uL Final  . Hemoglobin 12/18/2018 11.0* 12.0 - 15.0 g/dL Final  . HCT 12/18/2018 36.0  36.0 - 46.0 % Final  . MCV 12/18/2018 82.9  80.0 - 100.0 fL Final  . MCH 12/18/2018 25.3* 26.0 - 34.0 pg Final  . MCHC 12/18/2018 30.6  30.0 - 36.0 g/dL Final  . RDW 12/18/2018 16.7* 11.5 - 15.5 % Final  . Platelets 12/18/2018 133* 150 - 400 K/uL Final  . nRBC 12/18/2018 0.0  0.0 - 0.2 % Final  . Neutrophils Relative % 12/18/2018 71  % Final  . Neutro Abs 12/18/2018 4.7  1.7 - 7.7 K/uL Final  . Lymphocytes Relative 12/18/2018 14  % Final  . Lymphs Abs 12/18/2018 0.9  0.7 - 4.0 K/uL Final  . Monocytes Relative 12/18/2018 11  % Final  . Monocytes Absolute 12/18/2018 0.7  0.1 - 1.0 K/uL Final  . Eosinophils Relative 12/18/2018 2  % Final  . Eosinophils Absolute 12/18/2018 0.1  0.0 - 0.5 K/uL Final  . Basophils Relative 12/18/2018 1  % Final  . Basophils Absolute 12/18/2018 0.1  0.0 - 0.1 K/uL Final  . Immature Granulocytes 12/18/2018 1  % Final  . Abs Immature Granulocytes 12/18/2018 0.06  0.00 - 0.07 K/uL Final   Performed at [facility], [address] [place], [place] [address]    (this displays the last labs from the last 3 days)  No results found for: TOTALPROTELP, ALBUMINELP, A1GS, A2GS, BETS, BETA2SER, GAMS, MSPIKE, SPEI (this displays SPEP labs)  No results found for: KPAFRELGTCHN, LAMBDASER,  KAPLAMBRATIO (kappa/lambda light chains)  No results found for: HGBA, HGBA2QUANT, HGBFQUANT, HGBSQUAN (Hemoglobinopathy evaluation)   No results found for: LDH  No results found for: IRON, TIBC, IRONPCTSAT (Iron and TIBC)  No results found for: FERRITIN  Urinalysis    Component Value Date/Time   COLORURINE AMBER (A) 04/01/2017 0920   APPEARANCEUR CLOUDY (A) 04/01/2017 0920   LABSPEC 1.021 04/01/2017 0920   PHURINE 5.0 04/01/2017 0920   GLUCOSEU NEGATIVE 04/01/2017 0920   HGBUR MODERATE (A) 04/01/2017 0920   BILIRUBINUR NEGATIVE 04/01/2017 0920   KETONESUR 80 (A) 04/01/2017 0920   PROTEINUR NEGATIVE 04/01/2017 0920   NITRITE NEGATIVE 04/01/2017 0920   LEUKOCYTESUR LARGE (A) 04/01/2017 0920     STUDIES: MRI results reviewed with the patient  ELIGIBLE FOR AVAILABLE RESEARCH PROTOCOL: no  ASSESSMENT: 73 y.o. [place], [place] woman status post right breast biopsy of overlapping sites on 08/19/2018 for a clinical T4 N1, stage IIIB invasive lobular carcinoma, estrogen receptor positive, progesterone receptor negative, HER-2 not amplified, with an MIB-1 of 20%.  (a) CT chest/abd/pelvis 09/02/2018 and bone scan 09/10/2018 show no evidence of stage IV disease  (b) breast MRI 09/14/2018 confirms a multicentric 12.6 cm right breast mass infiltrating pectpralis but w/o associated adenopathy  (1) genetics testing pending  (2) neoadjuvant chemotherapy to consist of cyclophosphamide, methotrexate and fluorouracil every 21 days x 8,  begin 09/16/2018  (3) definitive surgery to follow  (4) adjuvant radiation  (5) antiestrogens therapy at the completion of local treatment  (6) foundation 1/PD-L1 testing:  (a) PD-L1 requested 12/18/2018  (b) foundation ones shows the microsatellite status to be stable, and the mutational burden to be 16/Mb  (c) there are potentially actionable mutations in AKT3, TSC1, KRAS  MAPK1 and RB1   PLAN: [name] [name] is tolerating CMF chemotherapy remarkably well  and she will receive her fourth of 8 planned cycles today.  However I am not seeing a significant response.  I am setting her up for an MRI hopefully next week of the breast and if we have "move the cancer off the chest wall" perhaps Dr. Marlou Starks will be able to proceed to modified radical mastectomy  I am also starting her on anastrozole tomorrow.  We will see how she tolerates it and if we do not document significant response by MRI we will probably stop the chemo and go with antiestrogens, perhaps with the addition of palbociclib.  They know to call for any other issue that may develop before the next visit.  Johnathen Testa, [name], MD  12/18/18 9:48 AM Medical Oncology and Hematology [facility] [address] [place], [place] [address] Tel. [phone]    Fax. [phone]   I, Jacqualyn Posey am acting as a Education administrator for Chauncey Cruel, MD.   I, Lurline Del MD, have reviewed the above documentation for accuracy and completeness, and I agree with the above.

## 2018-12-18 ENCOUNTER — Inpatient Hospital Stay: Payer: Medicare Other

## 2018-12-18 ENCOUNTER — Other Ambulatory Visit: Payer: Self-pay | Admitting: *Deleted

## 2018-12-18 ENCOUNTER — Inpatient Hospital Stay: Payer: Medicare Other | Attending: Oncology

## 2018-12-18 ENCOUNTER — Inpatient Hospital Stay (HOSPITAL_BASED_OUTPATIENT_CLINIC_OR_DEPARTMENT_OTHER): Payer: Medicare Other | Admitting: Oncology

## 2018-12-18 ENCOUNTER — Other Ambulatory Visit: Payer: Self-pay | Admitting: Oncology

## 2018-12-18 VITALS — BP 144/75 | HR 99 | Temp 98.7°F | Resp 18 | Ht 66.0 in | Wt 181.3 lb

## 2018-12-18 DIAGNOSIS — C50811 Malignant neoplasm of overlapping sites of right female breast: Secondary | ICD-10-CM | POA: Diagnosis not present

## 2018-12-18 DIAGNOSIS — C50911 Malignant neoplasm of unspecified site of right female breast: Secondary | ICD-10-CM

## 2018-12-18 DIAGNOSIS — Z170 Estrogen receptor positive status [ER+]: Secondary | ICD-10-CM

## 2018-12-18 DIAGNOSIS — I63132 Cerebral infarction due to embolism of left carotid artery: Secondary | ICD-10-CM

## 2018-12-18 DIAGNOSIS — Z5111 Encounter for antineoplastic chemotherapy: Secondary | ICD-10-CM | POA: Diagnosis not present

## 2018-12-18 DIAGNOSIS — I69359 Hemiplegia and hemiparesis following cerebral infarction affecting unspecified side: Secondary | ICD-10-CM

## 2018-12-18 DIAGNOSIS — Z5189 Encounter for other specified aftercare: Secondary | ICD-10-CM | POA: Insufficient documentation

## 2018-12-18 LAB — CBC WITH DIFFERENTIAL/PLATELET
Abs Immature Granulocytes: 0.06 10*3/uL (ref 0.00–0.07)
Basophils Absolute: 0.1 10*3/uL (ref 0.0–0.1)
Basophils Relative: 1 %
Eosinophils Absolute: 0.1 10*3/uL (ref 0.0–0.5)
Eosinophils Relative: 2 %
HCT: 36 % (ref 36.0–46.0)
Hemoglobin: 11 g/dL — ABNORMAL LOW (ref 12.0–15.0)
Immature Granulocytes: 1 %
LYMPHS PCT: 14 %
Lymphs Abs: 0.9 10*3/uL (ref 0.7–4.0)
MCH: 25.3 pg — ABNORMAL LOW (ref 26.0–34.0)
MCHC: 30.6 g/dL (ref 30.0–36.0)
MCV: 82.9 fL (ref 80.0–100.0)
Monocytes Absolute: 0.7 10*3/uL (ref 0.1–1.0)
Monocytes Relative: 11 %
Neutro Abs: 4.7 10*3/uL (ref 1.7–7.7)
Neutrophils Relative %: 71 %
Platelets: 133 10*3/uL — ABNORMAL LOW (ref 150–400)
RBC: 4.34 MIL/uL (ref 3.87–5.11)
RDW: 16.7 % — ABNORMAL HIGH (ref 11.5–15.5)
WBC: 6.6 10*3/uL (ref 4.0–10.5)
nRBC: 0 % (ref 0.0–0.2)

## 2018-12-18 LAB — COMPREHENSIVE METABOLIC PANEL
ALT: 56 U/L — ABNORMAL HIGH (ref 0–44)
AST: 25 U/L (ref 15–41)
Albumin: 3.8 g/dL (ref 3.5–5.0)
Alkaline Phosphatase: 116 U/L (ref 38–126)
Anion gap: 8 (ref 5–15)
BUN: 16 mg/dL (ref 8–23)
CO2: 25 mmol/L (ref 22–32)
Calcium: 9 mg/dL (ref 8.9–10.3)
Chloride: 109 mmol/L (ref 98–111)
Creatinine, Ser: 0.79 mg/dL (ref 0.44–1.00)
GFR calc Af Amer: >60 mL/min (ref >60)
GFR calc non Af Amer: >60 mL/min (ref >60)
Glucose, Bld: 113 mg/dL — ABNORMAL HIGH (ref 70–99)
Potassium: 3.5 mmol/L (ref 3.5–5.1)
Sodium: 142 mmol/L (ref 135–145)
Total Bilirubin: 0.6 mg/dL (ref 0.3–1.2)
Total Protein: 7.1 g/dL (ref 6.5–8.1)

## 2018-12-18 MED ORDER — METHOTREXATE SODIUM (PF) CHEMO INJECTION 250 MG/10ML
39.8000 mg/m2 | Freq: Once | INTRAMUSCULAR | Status: AC
  Administered 2018-12-18: 76 mg via INTRAVENOUS
  Filled 2018-12-18: qty 3.04

## 2018-12-18 MED ORDER — SODIUM CHLORIDE 0.9 % IV SOLN
Freq: Once | INTRAVENOUS | Status: AC
  Administered 2018-12-18: 10:00:00 via INTRAVENOUS
  Filled 2018-12-18: qty 250

## 2018-12-18 MED ORDER — FLUOROURACIL CHEMO INJECTION 2.5 GM/50ML
600.0000 mg/m2 | Freq: Once | INTRAVENOUS | Status: AC
  Administered 2018-12-18: 1150 mg via INTRAVENOUS
  Filled 2018-12-18: qty 23

## 2018-12-18 MED ORDER — HEPARIN SOD (PORK) LOCK FLUSH 100 UNIT/ML IV SOLN
500.0000 [IU] | Freq: Once | INTRAVENOUS | Status: AC | PRN
  Administered 2018-12-18: 500 [IU]
  Filled 2018-12-18: qty 5

## 2018-12-18 MED ORDER — ANASTROZOLE 1 MG PO TABS: 1.0000 mg | ORAL_TABLET | Freq: Every day | ORAL | 3 refills | Status: DC

## 2018-12-18 MED ORDER — SODIUM CHLORIDE 0.9 % IV SOLN
600.0000 mg/m2 | Freq: Once | INTRAVENOUS | Status: AC
  Administered 2018-12-18: 1140 mg via INTRAVENOUS
  Filled 2018-12-18: qty 57

## 2018-12-18 MED ORDER — SODIUM CHLORIDE 0.9% FLUSH
10.0000 mL | INTRAVENOUS | Status: DC | PRN
  Administered 2018-12-18: 10 mL
  Filled 2018-12-18: qty 10

## 2018-12-18 MED ORDER — PALONOSETRON HCL INJECTION 0.25 MG/5ML
INTRAVENOUS | Status: AC
  Filled 2018-12-18: qty 5

## 2018-12-18 MED ORDER — PALONOSETRON HCL INJECTION 0.25 MG/5ML
0.2500 mg | Freq: Once | INTRAVENOUS | Status: AC
  Administered 2018-12-18: 0.25 mg via INTRAVENOUS

## 2018-12-18 MED ORDER — DEXAMETHASONE SODIUM PHOSPHATE 10 MG/ML IJ SOLN
10.0000 mg | Freq: Once | INTRAMUSCULAR | Status: AC
  Administered 2018-12-18: 10 mg via INTRAVENOUS

## 2018-12-18 MED ORDER — DEXAMETHASONE SODIUM PHOSPHATE 10 MG/ML IJ SOLN
INTRAMUSCULAR | Status: AC
  Filled 2018-12-18: qty 1

## 2018-12-18 MED FILL — LISINOPRIL-HCTZ 20-12.5 MG: 20-12.5 | 30 days supply | Qty: 30 | Fill #1

## 2018-12-18 MED FILL — ANASTROZOLE 1 MG TABLET: 1 | 30 days supply | Qty: 30 | Fill #0

## 2018-12-18 NOTE — Patient Instructions (Signed)
[place] Discharge Instructions for Patients Receiving Chemotherapy  Today you received the following chemotherapy agents:  Cytoxan, Methotrexate, Fluorouracil  To help prevent nausea and vomiting after your treatment, we encourage you to take your nausea medication as prescribed. * NO ZOFRAN FOR 3 DAYS AFTER CHEMO *    If you develop nausea and vomiting that is not controlled by your nausea medication, call the clinic.   BELOW ARE SYMPTOMS THAT SHOULD BE REPORTED IMMEDIATELY:  *FEVER GREATER THAN 100.5 F  *CHILLS WITH OR WITHOUT FEVER  NAUSEA AND VOMITING THAT IS NOT CONTROLLED WITH YOUR NAUSEA MEDICATION  *UNUSUAL SHORTNESS OF BREATH  *UNUSUAL BRUISING OR BLEEDING  TENDERNESS IN MOUTH AND THROAT WITH OR WITHOUT PRESENCE OF ULCERS  *URINARY PROBLEMS  *BOWEL PROBLEMS  UNUSUAL RASH Items with * indicate a potential emergency and should be followed up as soon as possible.  Feel free to call the clinic should you have any questions or concerns. The clinic phone number is [phone].  Please show the [employer] at check-in to the Emergency Department and triage nurse.

## 2018-12-18 NOTE — Patient Instructions (Signed)

## 2018-12-21 ENCOUNTER — Inpatient Hospital Stay: Payer: Medicare Other

## 2018-12-21 ENCOUNTER — Other Ambulatory Visit: Payer: Self-pay | Admitting: *Deleted

## 2018-12-21 VITALS — BP 152/53 | HR 94 | Temp 98.9°F | Resp 18

## 2018-12-21 DIAGNOSIS — C50911 Malignant neoplasm of unspecified site of right female breast: Secondary | ICD-10-CM

## 2018-12-21 DIAGNOSIS — Z170 Estrogen receptor positive status [ER+]: Secondary | ICD-10-CM

## 2018-12-21 DIAGNOSIS — C50811 Malignant neoplasm of overlapping sites of right female breast: Secondary | ICD-10-CM | POA: Diagnosis not present

## 2018-12-21 DIAGNOSIS — Z5111 Encounter for antineoplastic chemotherapy: Secondary | ICD-10-CM | POA: Diagnosis not present

## 2018-12-21 DIAGNOSIS — Z5189 Encounter for other specified aftercare: Secondary | ICD-10-CM | POA: Diagnosis not present

## 2018-12-21 MED ORDER — PEGFILGRASTIM-CBQV 6 MG/0.6ML ~~LOC~~ SOSY
6.0000 mg | PREFILLED_SYRINGE | Freq: Once | SUBCUTANEOUS | Status: AC
  Administered 2018-12-21: 6 mg via SUBCUTANEOUS

## 2018-12-21 MED ORDER — PEGFILGRASTIM-CBQV 6 MG/0.6ML ~~LOC~~ SOSY
PREFILLED_SYRINGE | SUBCUTANEOUS | Status: AC
  Filled 2018-12-21: qty 0.6

## 2018-12-21 NOTE — Progress Notes (Unsigned)
PD L1 testing request faxed to [facility] at [phone] per VM request from [place] at pathology [facility].

## 2018-12-21 NOTE — Patient Instructions (Signed)
Pegfilgrastim injection  What is this medicine?  PEGFILGRASTIM (PEG fil gra stim) is a long-acting granulocyte colony-stimulating factor that stimulates the growth of neutrophils, a type of white blood cell important in the body's fight against infection. It is used to reduce the incidence of fever and infection in patients with certain types of cancer who are receiving chemotherapy that affects the bone marrow, and to increase survival after being exposed to high doses of radiation.  This medicine may be used for other purposes; ask your health care provider or pharmacist if you have questions.  COMMON BRAND NAME(S): Fulphila, Neulasta, UDENYCA  What should I tell my health care provider before I take this medicine?  They need to know if you have any of these conditions:  -kidney disease  -latex allergy  -ongoing radiation therapy  -sickle cell disease  -skin reactions to acrylic adhesives (On-Body Injector only)  -an unusual or allergic reaction to pegfilgrastim, filgrastim, other medicines, foods, dyes, or preservatives  -pregnant or trying to get pregnant  -breast-feeding  How should I use this medicine?  This medicine is for injection under the skin. If you get this medicine at home, you will be taught how to prepare and give the pre-filled syringe or how to use the On-body Injector. Refer to the patient Instructions for Use for detailed instructions. Use exactly as directed. Tell your healthcare provider immediately if you suspect that the On-body Injector may not have performed as intended or if you suspect the use of the On-body Injector resulted in a missed or partial dose.  It is important that you put your used needles and syringes in a special sharps container. Do not put them in a trash can. If you do not have a sharps container, call your pharmacist or healthcare provider to get one.  Talk to your pediatrician regarding the use of this medicine in children. While this drug may be prescribed for  selected conditions, precautions do apply.  Overdosage: If you think you have taken too much of this medicine contact a poison control center or emergency room at once.  NOTE: This medicine is only for you. Do not share this medicine with others.  What if I miss a dose?  It is important not to miss your dose. Call your doctor or health care professional if you miss your dose. If you miss a dose due to an On-body Injector failure or leakage, a new dose should be administered as soon as possible using a single prefilled syringe for manual use.  What may interact with this medicine?  Interactions have not been studied.  Give your health care provider a list of all the medicines, herbs, non-prescription drugs, or dietary supplements you use. Also tell them if you smoke, drink alcohol, or use illegal drugs. Some items may interact with your medicine.  This list may not describe all possible interactions. Give your health care provider a list of all the medicines, herbs, non-prescription drugs, or dietary supplements you use. Also tell them if you smoke, drink alcohol, or use illegal drugs. Some items may interact with your medicine.  What should I watch for while using this medicine?  You may need blood work done while you are taking this medicine.  If you are going to need a MRI, CT scan, or other procedure, tell your doctor that you are using this medicine (On-Body Injector only).  What side effects may I notice from receiving this medicine?  Side effects that you should report to   your doctor or health care professional as soon as possible:  -allergic reactions like skin rash, itching or hives, swelling of the face, lips, or tongue  -back pain  -dizziness  -fever  -pain, redness, or irritation at site where injected  -pinpoint red spots on the skin  -red or dark-brown urine  -shortness of breath or breathing problems  -stomach or side pain, or pain at the shoulder  -swelling  -tiredness  -trouble passing urine or  change in the amount of urine  Side effects that usually do not require medical attention (report to your doctor or health care professional if they continue or are bothersome):  -bone pain  -muscle pain  This list may not describe all possible side effects. Call your doctor for medical advice about side effects. You may report side effects to FDA at 1-800-FDA-1088.  Where should I keep my medicine?  Keep out of the reach of children.  If you are using this medicine at home, you will be instructed on how to store it. Throw away any unused medicine after the expiration date on the label.  NOTE: This sheet is a summary. It may not cover all possible information. If you have questions about this medicine, talk to your doctor, pharmacist, or health care provider.   2019 Elsevier/Gold Standard (2018-02-02 16:57:08)

## 2018-12-23 ENCOUNTER — Telehealth: Payer: Self-pay

## 2018-12-23 NOTE — Telephone Encounter (Signed)
Received called from [place] at [facility].  They are unable to preform PDL1 test due to both blocks being exhausted of tissue.    Nurse informed Dr. Jana Hakim of above information.  No new orders/instructions given at this time.

## 2019-01-03 ENCOUNTER — Ambulatory Visit
Admission: RE | Admit: 2019-01-03 | Discharge: 2019-01-03 | Disposition: A | Discharge status: Home / Self Care | Payer: Medicare Other | Source: Ambulatory Visit | Attending: Oncology | Admitting: Oncology

## 2019-01-03 DIAGNOSIS — C50811 Malignant neoplasm of overlapping sites of right female breast: Secondary | ICD-10-CM

## 2019-01-03 DIAGNOSIS — C50911 Malignant neoplasm of unspecified site of right female breast: Secondary | ICD-10-CM | POA: Diagnosis not present

## 2019-01-03 DIAGNOSIS — Z5111 Encounter for antineoplastic chemotherapy: Secondary | ICD-10-CM | POA: Diagnosis not present

## 2019-01-03 DIAGNOSIS — Z170 Estrogen receptor positive status [ER+]: Principal | ICD-10-CM

## 2019-01-03 MED ORDER — GADOBUTROL 1 MMOL/ML IV SOLN
9.0000 mL | Freq: Once | INTRAVENOUS | Status: AC | PRN
  Administered 2019-01-03: 9 mL via INTRAVENOUS

## 2019-01-07 NOTE — Progress Notes (Signed)
[place]  Telephone:([phone]) [phone] Fax:(336) [phone]    ID: Oneta Sigman DOB: [birth year]  MR#: [id number]  TMH#:[id number]  Patient Care Team: [name], MD as PCP - General (Internal Medicine) Waynetta Sandy, MD as Consulting Physician (Vascular Surgery) Patsey Berthold, NP as Nurse Practitioner (Cardiology) [name], MD as Consulting Physician (Physical Medicine and Rehabilitation) [name], [name], NP as Nurse Practitioner (Vascular Surgery) Edgardo Roys, PsyD as Consulting Physician (Psychology) Yasemin Rabon, [name], MD as Consulting Physician (Oncology) [name], MD as Consulting Physician (General Surgery) Eppie Gibson, MD as Attending Physician (Radiation Oncology) OTHER MD:   CHIEF COMPLAINT: Inflammatory breast cancer  CURRENT TREATMENT: Neoadjuvant anastrozole; definitive surgery pending   INTERVAL HISTORY: [name] [name] returns today for follow-up and treatment of her locally advanced breast cancer. She is accompanied by her daughter.  Since her last visit here, she underwent a bilateral breast MRI with and without contrast on 01/03/2019 showing no significant change of known RIGHT breast malignancy since 09/13/2018, involving a large portion of the RIGHT breast. No new or progressive disease. No abnormal appearing lymph nodes. No MR evidence of LEFT breast malignancy.  Given the lack of response, her neoadjuvant chemotherapy consisting of cyclophosphamide, methotrexate and fluorouracil (CMF) received every 21 days x 8 is being held.   She started anastrozole last week. She tolerates this well and without any noticeable side effects.      REVIEW OF SYSTEMS: Wetona's daughter questions if chemo can affect vision, which it can. On a day to day, she will get up, eat breakfast, watch some TV, have lunch from meals on wheels, and generally just meander around the house; she does not get out much. She does some minor chores  around the house, but nothing strenuous. When she lays flat, she says she feels like she is going to choke. The patient denies unusual headaches, visual changes, nausea, vomiting, or dizziness. There has been no unusual cough, phlegm production, or pleurisy. This been no change in bowel or bladder habits. The patient denies unexplained fatigue or unexplained weight loss, bleeding, rash, or fever. A detailed review of systems was otherwise noncontributory.    HISTORY OF CURRENT ILLNESS: [name] [name] presented with right breast discoloration and redness. The right breast also had an area of hardness and nipple retraction.  She did not bring this to medical attention but her daughter did mention it when the patient had neurologic follow-up on 08/10/2018.  At that time the nurse practitioner, [name], noted right nipple inversion, hard breast, and red non-ulcerated lesions on the breast.  The patient then underwent bilateral diagnostic mammography with tomography and right breast ultrasonography at [facility] on 08/11/2018 (this is her first ever mammogram) showing: breast density category B. There was a round 2.5 cm mass at the 1 o'clock upper inner quadrant of the right breast.  By palpation this measured approximately 10 cm.  Sonographically the irregular hypoechoic mass measured 2.7 cm. There was also a hypoechoic mass in the right breast at 9 o'clock upper outer quadrant measuring 3.8 cm. There was an abnormal right axillary lymph node with cortical thickening.   Accordingly on 08/19/2018 she proceeded to biopsy of the right breast areas in question. The pathology from this procedure showed [phone]): At the 1 o'clock: invasive lobular carcinoma, grade II. Prognostic indicators significant for: estrogen receptor, 60% positive with moderate staining intensity and progesterone receptor, 0% negative. Proliferation marker Ki67 at 20%. HER2 negative with an immunohistochemistry of (1+).  At  the 9 o'clock:  invasive lobular carcinoma, grade II. Prognostic indicators significant for: estrogen receptor, 90% positive with strong staining intensity and progesterone receptor, 0% negative. Proliferation marker Ki67 at 30%. HER2 negative with an immunohistochemistry of (1+).  The patient's subsequent history is as detailed below.    PAST MEDICAL HISTORY: Past Medical History:  Diagnosis Date  . Anxiety   . Cancer [facility])    breast cancer- right  . Carotid stenosis   . Depression    situational  . Dyspnea    with much ambulation  . GERD (gastroesophageal reflux disease)   . Hypertension   . Stroke ([place]) 04/01/2017   a little weakness right side leg and hand, a little memry loss    PAST SURGICAL HISTORY: Past Surgical History:  Procedure Laterality Date  . ABDOMINAL HYSTERECTOMY    . ENDARTERECTOMY Left 04/08/2017   Procedure: ENDARTERECTOMY CAROTID;  Surgeon: Waynetta Sandy, MD;  Location: [place];  Service: Vascular;  Laterality: Left;  . PATCH ANGIOPLASTY Left 04/08/2017   Procedure: PATCH ANGIOPLASTY Left Carotid;  Surgeon: Waynetta Sandy, MD;  Location: [place];  Service: Vascular;  Laterality: Left;  . PORTACATH PLACEMENT Left 09/07/2018   Procedure: INSERTION PORT-A-CATH LEFT INTERNAL JUGULAR;  Surgeon: [name], MD;  Location: [place];  Service: General;  Laterality: Left;  total hysterectomy with bilateral salpingo-oophorectomy in her 73's no hormone replacement   FAMILY HISTORY: Family History  Problem Relation Age of Onset  . Cancer Mother        ovarian   The patient's father is alive at age 71. The patient's mother died at age 71 due to ovarian cancer. The patient had 3 brothers and 3 sisters. She denies a history of breast, pancreatic, colon, or other cancers in the family.    GYNECOLOGIC HISTORY:  No LMP recorded. Patient has had a hysterectomy. Menarche: Age at first live birth: 73 years old She is GX P1.  She is status post total hysterectomy  with BSO in her early 60's. She did not use HRT.    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. She is divorced. At home is just herself, but her two grandsons regularly come to visit and stay with her. The patient's daughter, [name] works in administration for [employer]. The patient's 2 y.o. grandson is a Freight forwarder for [employer]. The patient's 17 y.o. grandson works in [employer].    ADVANCED DIRECTIVES: Not in place.  At the 08/26/2018 visit the patient was given the appropriate documents to complete and notarized at her discretion.   HEALTH MAINTENANCE: Social History   Tobacco Use  . Smoking status: Never Smoker  . Smokeless tobacco: Never Used  Substance Use Topics  . Alcohol use: No  . Drug use: No     Colonoscopy: Never  PAP: Status post hysterectomy  Bone density: Never   No Known Allergies  Current Outpatient Medications  Medication Sig Dispense Refill  . amLODipine (NORVASC) 10 MG tablet Take 1 tablet (10 mg total) by mouth daily. PATIENT MUST HAVE OFFICE VISIT AND LABS PRIOR TO ANY FURTHER REFILLS 15 tablet 0  . anastrozole (ARIMIDEX) 1 MG tablet Take 1 tablet (1 mg total) by mouth daily. 30 tablet 3  . atorvastatin (LIPITOR) 80 MG tablet Take 1 tablet (80 mg total) by mouth daily at 6 PM. 30 tablet 0  . clopidogrel (PLAVIX) 75 MG tablet TAKE 1 TABLET BY MOUTH EVERY DAY 30 tablet 1  .  lidocaine-prilocaine (EMLA) cream Apply to affected area once 30 g 3  . lisinopril-hydrochlorothiazide (PRINZIDE,ZESTORETIC) 20-12.5 MG tablet Take 1 tablet by mouth daily.  11  . loratadine (CLARITIN) 10 MG tablet Take 10 mg by mouth daily as needed for allergies.    . potassium chloride SA (K-DUR,KLOR-CON) 20 MEQ tablet Take 1 tablet (20 mEq total) by mouth daily. 30 tablet 0  . prochlorperazine (COMPAZINE) 10 MG tablet Take 1 tablet (10 mg total) by mouth every 6 (six) hours as needed (Nausea or vomiting). 30 tablet 1   No current  facility-administered medications for this visit.    Facility-Administered Medications Ordered in Other Visits  Medication Dose Route Frequency Provider Last Rate Last Dose  . heparin lock flush 100 unit/mL  500 Units Intracatheter Once PRN Karely Hurtado, [name], MD      . sodium chloride flush (NS) 0.9 % injection 10 mL  10 mL Intracatheter PRN Orlanda Frankum, [name], MD        OBJECTIVE: Middle-aged African-American woman in no acute distress  Vitals:   01/08/19 0910  BP: (!) 157/49  Pulse: 86  Resp: 18  Temp: 98.6 F (37 C)  SpO2: 100%     Body mass index is 29.02 kg/m.   Wt Readings from Last 3 Encounters:  01/08/19 179 lb 12.8 oz (81.6 kg)  12/18/18 181 lb 4.8 oz (82.2 kg)  11/30/18 179 lb 1.6 oz (81.2 kg)      ECOG FS:2 - Symptomatic, <50% confined to bed  Sclerae unicteric, EOMs intact No cervical or supraclavicular adenopathy Lungs no rales or rhonchi Heart regular rate and rhythm Abd soft, nontender, positive bowel sounds MSK no focal spinal tenderness, no upper extremity lymphedema Neuro: nonfocal, pleasant affect Breasts: The right breast is imaged below.  There continues to be significant induration, but the breast is movable and the skin involvement does not cross the midline.  The left breast is unremarkable.  Both axillae are benign.  Right Breast 01/08/2019    Right Breast 08/26/2018    LAB RESULTS:  CMP     Component Value Date/Time   NA 141 01/08/2019 0845   K 3.6 01/08/2019 0845   CL 107 01/08/2019 0845   CO2 26 01/08/2019 0845   GLUCOSE 102 (H) 01/08/2019 0845   BUN 17 01/08/2019 0845   CREATININE 0.88 01/08/2019 0845   CREATININE 1.11 (H) 11/03/2018 1017   CALCIUM 9.1 01/08/2019 0845   PROT 7.1 01/08/2019 0845   ALBUMIN 3.9 01/08/2019 0845   AST 22 01/08/2019 0845   AST 32 11/03/2018 1017   ALT 35 01/08/2019 0845   ALT 48 (H) 11/03/2018 1017   ALKPHOS 107 01/08/2019 0845   BILITOT 0.4 01/08/2019 0845   BILITOT 0.5 11/03/2018 1017    GFRNONAA >60 01/08/2019 0845   GFRNONAA 50 (L) 11/03/2018 1017   GFRAA >60 01/08/2019 0845   GFRAA 58 (L) 11/03/2018 1017    No results found for: TOTALPROTELP, ALBUMINELP, A1GS, A2GS, BETS, BETA2SER, GAMS, MSPIKE, SPEI  No results found for: KPAFRELGTCHN, LAMBDASER, KAPLAMBRATIO  Lab Results  Component Value Date   WBC 5.8 01/08/2019   NEUTROABS 3.9 01/08/2019   HGB 11.5 (L) 01/08/2019   HCT 37.1 01/08/2019   MCV 83.0 01/08/2019   PLT 165 01/08/2019    _0 @  No results found for: LABCA2  No components found for: KNLZJQ734  No results for input(s): INR in the last 168 hours.  No results found for: LABCA2  No results found for: LPF790  No results found for: QQI297  No results found for: LGX211  Lab Results  Component Value Date   CA2729 54.4 (H) 09/15/2018    No components found for: HGQUANT  No results found for: CEA1 / No results found for: CEA1   No results found for: AFPTUMOR  No results found for: CHROMOGRNA  No results found for: PSA1  Appointment on 01/08/2019  Component Date Value Ref Range Status  . WBC 01/08/2019 5.8  4.0 - 10.5 K/uL Final  . RBC 01/08/2019 4.47  3.87 - 5.11 MIL/uL Final  . Hemoglobin 01/08/2019 11.5* 12.0 - 15.0 g/dL Final  . HCT 01/08/2019 37.1  36.0 - 46.0 % Final  . MCV 01/08/2019 83.0  80.0 - 100.0 fL Final  . MCH 01/08/2019 25.7* 26.0 - 34.0 pg Final  . MCHC 01/08/2019 31.0  30.0 - 36.0 g/dL Final  . RDW 01/08/2019 16.7* 11.5 - 15.5 % Final  . Platelets 01/08/2019 165  150 - 400 K/uL Final  . nRBC 01/08/2019 0.0  0.0 - 0.2 % Final  . Neutrophils Relative % 01/08/2019 67  % Final  . Neutro Abs 01/08/2019 3.9  1.7 - 7.7 K/uL Final  . Lymphocytes Relative 01/08/2019 15  % Final  . Lymphs Abs 01/08/2019 0.9  0.7 - 4.0 K/uL Final  . Monocytes Relative 01/08/2019 14  % Final  . Monocytes Absolute 01/08/2019 0.8  0.1 - 1.0 K/uL Final  . Eosinophils Relative 01/08/2019 2  % Final  . Eosinophils Absolute  01/08/2019 0.1  0.0 - 0.5 K/uL Final  . Basophils Relative 01/08/2019 2  % Final  . Basophils Absolute 01/08/2019 0.1  0.0 - 0.1 K/uL Final  . Immature Granulocytes 01/08/2019 0  % Final  . Abs Immature Granulocytes 01/08/2019 0.02  0.00 - 0.07 K/uL Final   Performed at [facility], [place] [address], [place], [place] [address]  . Sodium 01/08/2019 141  135 - 145 mmol/L Final  . Potassium 01/08/2019 3.6  3.5 - 5.1 mmol/L Final  . Chloride 01/08/2019 107  98 - 111 mmol/L Final  . CO2 01/08/2019 26  22 - 32 mmol/L Final  . Glucose, Bld 01/08/2019 102* 70 - 99 mg/dL Final  . BUN 01/08/2019 17  8 - 23 mg/dL Final  . Creatinine, Ser 01/08/2019 0.88  0.44 - 1.00 mg/dL Final  . Calcium 01/08/2019 9.1  8.9 - 10.3 mg/dL Final  . Total Protein 01/08/2019 7.1  6.5 - 8.1 g/dL Final  . Albumin 01/08/2019 3.9  3.5 - 5.0 g/dL Final  . AST 01/08/2019 22  15 - 41 U/L Final  . ALT 01/08/2019 35  0 - 44 U/L Final  . Alkaline Phosphatase 01/08/2019 107  38 - 126 U/L Final  . Total Bilirubin 01/08/2019 0.4  0.3 - 1.2 mg/dL Final  . GFR calc non Af Amer 01/08/2019 >60  >60 mL/min Final  . GFR calc Af Amer 01/08/2019 >60  >60 mL/min Final  . Anion gap 01/08/2019 8  5 - 15 Final   Performed at [facility], [place] [address], [place], [place] [address]    (this displays the last labs from the last 3 days)  No results found for: TOTALPROTELP, ALBUMINELP, A1GS, A2GS, BETS, BETA2SER, GAMS, MSPIKE, SPEI (this displays SPEP labs)  No results found for: KPAFRELGTCHN, LAMBDASER, KAPLAMBRATIO (kappa/lambda light chains)  No results found for: HGBA, HGBA2QUANT, HGBFQUANT, HGBSQUAN (Hemoglobinopathy evaluation)   No results found for: LDH  No results found  for: IRON, TIBC, IRONPCTSAT (Iron and TIBC)  No results found for: FERRITIN  Urinalysis    Component Value Date/Time   COLORURINE AMBER (A) 04/01/2017 0920   APPEARANCEUR CLOUDY (A) 04/01/2017 0920    LABSPEC 1.021 04/01/2017 0920   PHURINE 5.0 04/01/2017 0920   GLUCOSEU NEGATIVE 04/01/2017 0920   HGBUR MODERATE (A) 04/01/2017 0920   BILIRUBINUR NEGATIVE 04/01/2017 0920   KETONESUR 80 (A) 04/01/2017 0920   PROTEINUR NEGATIVE 04/01/2017 0920   NITRITE NEGATIVE 04/01/2017 0920   LEUKOCYTESUR LARGE (A) 04/01/2017 0920     STUDIES: Mr Breast Bilateral W Wo Contrast Inc Cad  Result Date: 01/04/2019 CLINICAL DATA:  73 year old female for follow-up of invasive lobular RIGHT breast cancer, diagnosed in 07/2018 and started chemotherapy in 08/2018. LABS:  None performed today EXAM: BILATERAL BREAST MRI WITH AND WITHOUT CONTRAST TECHNIQUE: Multiplanar, multisequence MR images of both breasts were obtained prior to and following the intravenous administration of 9 ml of Gadavist Three-dimensional MR images were rendered by post-processing of the original MR data on an independent workstation. The three-dimensional MR images were interpreted, and findings are reported in the following complete MRI report for this study. Three dimensional images were evaluated at the independent DynaCad workstation COMPARISON:  09/13/2018 MR FINDINGS: Breast composition: c. Heterogeneous fibroglandular tissue. Background parenchymal enhancement: Mild Right breast: There has been no significant change in diffuse abnormal masslike and non masslike enhancement throughout most of the RIGHT breast (with the mid and UPPER RIGHT breast more involved). RIGHT breast skin thickening, nipple retraction and large contact area with the RIGHT pectoralis major muscle are also unchanged. Biopsy clip artifact within the UPPER INNER and RETROAREOLAR RIGHT breast again noted. Left breast: No mass or abnormal enhancement. Lymph nodes: No abnormal appearing lymph nodes. Ancillary findings:  None. IMPRESSION: 1. No significant change of known RIGHT breast malignancy since 09/13/2018, involving a large portion of the RIGHT breast. No new or  progressive disease. No abnormal appearing lymph nodes. 2. No MR evidence of LEFT breast malignancy. RECOMMENDATION: Treatment plan BI-RADS CATEGORY  6: Known biopsy-proven malignancy. Electronically Signed   By: Margarette Canada M.D.   On: 01/04/2019 12:22    ELIGIBLE FOR AVAILABLE RESEARCH PROTOCOL: no   ASSESSMENT: 73 y.o. [place], [place] woman status post right breast biopsy of overlapping sites on 08/19/2018 for a clinical T4 N1, stage IIIB invasive lobular carcinoma, estrogen receptor positive, progesterone receptor negative, HER-2 not amplified, with an MIB-1 of 20%.  (a) CT chest/abd/pelvis 09/02/2018 and bone scan 09/10/2018 show no evidence of stage IV disease  (b) breast MRI 09/14/2018 confirms a multicentric 12.6 cm right breast mass infiltrating pectpralis but w/o associated adenopathy  (1) genetics testing to be scheduled  (2) neoadjuvant chemotherapy consisting of cyclophosphamide, methotrexate and fluorouracil every 21 days x 8, started 09/16/2018, last dose 12/18/2018  (a) CMF chemotherapy stopped after 4 cycles, with no evidence of response by MRI  (3) definitive surgery to follow  (4) adjuvant radiation  (5) anastrozole started February 2019  (a) bone density to be obtained  (6) foundation 1/PD-L1 testing:  (a) PD-L1 requested 12/18/2018  (b) foundation one testing shows the microsatellite status to be stable, and the mutational burden to be 16/Mb  (c) there are potentially actionable mutations in AKT3, TSC1, KRAS  MAPK1 and RB1   PLAN: Shantoria tolerated her CMF chemotherapy remarkably well, although her overall functional status is poor (ECOG 2).  Unfortunately she has had no significant improvement after 4 cycles of treatment.  Accordingly we  are stopping the CMF chemotherapy.  We considered intensifying treatment by adding anthracyclines and this remains a possibility, but the patient does not have a good functional status and lobular breast cancers in general responded  very poorly to neoadjuvant treatment, as was indeed the case here.  We have started neoadjuvant anastrozole and so far she is tolerating this well.  We will consider obtaining a bone density sometime within the next 3 months.  I have asked her surgeon Dr. Marlou Starks to review the MRI and let us know if he feels surgery at this point is possible.  If not the patient will continue anastrozole at neoadjuvantly for at least 3 months.  I have set her up for a CT scan of the chest and if there is any suspicious finding we will add palbociclib to the anastrozole.  Of course after surgery the patient will need adjuvant radiation.   I spent approximately 30 minutes face to face with Journee and her daughter with more than 50% of that time spent in counseling and coordination of care.   Ilias Stcharles, [name], MD  01/08/19 9:54 AM Medical Oncology and Hematology [facility] [address] [place], [place] [address] Tel. [phone]    Fax. [phone]   I, Jacqualyn Posey am acting as a Education administrator for Chauncey Cruel, MD.   I, Lurline Del MD, have reviewed the above documentation for accuracy and completeness, and I agree with the above.

## 2019-01-08 ENCOUNTER — Inpatient Hospital Stay (HOSPITAL_BASED_OUTPATIENT_CLINIC_OR_DEPARTMENT_OTHER): Payer: Medicare Other | Admitting: Oncology

## 2019-01-08 ENCOUNTER — Inpatient Hospital Stay: Payer: Medicare Other

## 2019-01-08 ENCOUNTER — Telehealth: Payer: Self-pay | Admitting: Oncology

## 2019-01-08 VITALS — BP 157/49 | HR 86 | Temp 98.6°F | Resp 18 | Ht 66.0 in | Wt 179.8 lb

## 2019-01-08 DIAGNOSIS — I63132 Cerebral infarction due to embolism of left carotid artery: Secondary | ICD-10-CM

## 2019-01-08 DIAGNOSIS — Z5111 Encounter for antineoplastic chemotherapy: Secondary | ICD-10-CM | POA: Diagnosis not present

## 2019-01-08 DIAGNOSIS — Z95828 Presence of other vascular implants and grafts: Secondary | ICD-10-CM

## 2019-01-08 DIAGNOSIS — I69359 Hemiplegia and hemiparesis following cerebral infarction affecting unspecified side: Secondary | ICD-10-CM

## 2019-01-08 DIAGNOSIS — Z170 Estrogen receptor positive status [ER+]: Secondary | ICD-10-CM | POA: Diagnosis not present

## 2019-01-08 DIAGNOSIS — C50911 Malignant neoplasm of unspecified site of right female breast: Secondary | ICD-10-CM

## 2019-01-08 DIAGNOSIS — C50811 Malignant neoplasm of overlapping sites of right female breast: Secondary | ICD-10-CM | POA: Diagnosis not present

## 2019-01-08 DIAGNOSIS — Z5189 Encounter for other specified aftercare: Secondary | ICD-10-CM | POA: Diagnosis not present

## 2019-01-08 LAB — CBC WITH DIFFERENTIAL/PLATELET
Abs Immature Granulocytes: 0.02 10*3/uL (ref 0.00–0.07)
Basophils Absolute: 0.1 10*3/uL (ref 0.0–0.1)
Basophils Relative: 2 %
EOS ABS: 0.1 10*3/uL (ref 0.0–0.5)
Eosinophils Relative: 2 %
HCT: 37.1 % (ref 36.0–46.0)
Hemoglobin: 11.5 g/dL — ABNORMAL LOW (ref 12.0–15.0)
Immature Granulocytes: 0 %
Lymphocytes Relative: 15 %
Lymphs Abs: 0.9 10*3/uL (ref 0.7–4.0)
MCH: 25.7 pg — ABNORMAL LOW (ref 26.0–34.0)
MCHC: 31 g/dL (ref 30.0–36.0)
MCV: 83 fL (ref 80.0–100.0)
Monocytes Absolute: 0.8 10*3/uL (ref 0.1–1.0)
Monocytes Relative: 14 %
Neutro Abs: 3.9 10*3/uL (ref 1.7–7.7)
Neutrophils Relative %: 67 %
Platelets: 165 10*3/uL (ref 150–400)
RBC: 4.47 MIL/uL (ref 3.87–5.11)
RDW: 16.7 % — AB (ref 11.5–15.5)
WBC: 5.8 10*3/uL (ref 4.0–10.5)
nRBC: 0 % (ref 0.0–0.2)

## 2019-01-08 LAB — COMPREHENSIVE METABOLIC PANEL
ALT: 35 U/L (ref 0–44)
AST: 22 U/L (ref 15–41)
Albumin: 3.9 g/dL (ref 3.5–5.0)
Alkaline Phosphatase: 107 U/L (ref 38–126)
Anion gap: 8 (ref 5–15)
BUN: 17 mg/dL (ref 8–23)
CO2: 26 mmol/L (ref 22–32)
Calcium: 9.1 mg/dL (ref 8.9–10.3)
Chloride: 107 mmol/L (ref 98–111)
Creatinine, Ser: 0.88 mg/dL (ref 0.44–1.00)
GFR calc Af Amer: >60 mL/min (ref >60)
GFR calc non Af Amer: >60 mL/min (ref >60)
Glucose, Bld: 102 mg/dL — ABNORMAL HIGH (ref 70–99)
Potassium: 3.6 mmol/L (ref 3.5–5.1)
Sodium: 141 mmol/L (ref 135–145)
Total Bilirubin: 0.4 mg/dL (ref 0.3–1.2)
Total Protein: 7.1 g/dL (ref 6.5–8.1)

## 2019-01-08 MED ORDER — SODIUM CHLORIDE 0.9% FLUSH
10.0000 mL | INTRAVENOUS | Status: DC | PRN
  Administered 2019-01-08: 10 mL
  Filled 2019-01-08: qty 10

## 2019-01-08 NOTE — Telephone Encounter (Signed)
Gave avs and calendar ° °

## 2019-01-11 ENCOUNTER — Other Ambulatory Visit: Payer: Self-pay | Admitting: Oncology

## 2019-01-11 ENCOUNTER — Ambulatory Visit (HOSPITAL_COMMUNITY)
Admission: RE | Admit: 2019-01-11 | Discharge: 2019-01-11 | Disposition: A | Discharge status: Home / Self Care | Payer: Medicare Other | Source: Ambulatory Visit | Attending: Oncology | Admitting: Oncology

## 2019-01-11 ENCOUNTER — Inpatient Hospital Stay: Payer: Medicare Other

## 2019-01-11 DIAGNOSIS — C50811 Malignant neoplasm of overlapping sites of right female breast: Secondary | ICD-10-CM | POA: Diagnosis not present

## 2019-01-11 DIAGNOSIS — Z170 Estrogen receptor positive status [ER+]: Secondary | ICD-10-CM | POA: Diagnosis not present

## 2019-01-11 DIAGNOSIS — R918 Other nonspecific abnormal finding of lung field: Secondary | ICD-10-CM

## 2019-01-11 DIAGNOSIS — Z5111 Encounter for antineoplastic chemotherapy: Secondary | ICD-10-CM | POA: Diagnosis not present

## 2019-01-11 DIAGNOSIS — C50911 Malignant neoplasm of unspecified site of right female breast: Secondary | ICD-10-CM | POA: Insufficient documentation

## 2019-01-11 MED ORDER — HEPARIN SOD (PORK) LOCK FLUSH 100 UNIT/ML IV SOLN
500.0000 [IU] | Freq: Once | INTRAVENOUS | Status: AC
  Administered 2019-01-11: 500 [IU] via INTRAVENOUS

## 2019-01-11 MED ORDER — IOHEXOL 300 MG/ML  SOLN
75.0000 mL | Freq: Once | INTRAMUSCULAR | Status: AC | PRN
  Administered 2019-01-11: 75 mL via INTRAVENOUS

## 2019-01-11 MED ORDER — SODIUM CHLORIDE (PF) 0.9 % IJ SOLN
INTRAMUSCULAR | Status: AC
  Filled 2019-01-11: qty 50

## 2019-01-11 MED ORDER — PALBOCICLIB 125 MG PO CAPS: 125.0000 mg | ORAL_CAPSULE | Freq: Every day | ORAL | 6 refills | Status: DC

## 2019-01-11 MED ORDER — HEPARIN SOD (PORK) LOCK FLUSH 100 UNIT/ML IV SOLN
INTRAVENOUS | Status: AC
  Filled 2019-01-11: qty 5

## 2019-01-11 MED FILL — AMLODIPINE BESYLATE 10 MG T: 10 | 90 days supply | Qty: 90 | Fill #1

## 2019-01-11 NOTE — Progress Notes (Signed)
[name] [name] CT shows some lung nodules which may well be metastatic.  I am going to add palbociclib to her treatment.  I called and discussed this with the patient's daughter  Dr. Marlou Starks does not feel she is a surgical candidate at this point.  I am hopeful after 3 or 4 months on antiestrogen treatment she may become a surgical candidate

## 2019-01-12 ENCOUNTER — Telehealth: Payer: Self-pay | Admitting: Pharmacist

## 2019-01-12 NOTE — Telephone Encounter (Addendum)
Oral Chemotherapy Pharmacist Encounter  I spoke with patient's daughter, [name], for overview of: Ibrance treatment of HR+/ HER2- advanced stage IIIB invasive lobular breast cancer with new lung nodules concerning for metastatic disease in conjunction with neoadjuvant anastrazole, planned duration until disease progression or unacceptable toxicity.   Counseled on administration, dosing, side effects, monitoring, drug-food interactions, safe handling, storage, and disposal.  Patient will take Ibrance 15m capsules, 1 capsule by mouth once daily with anastrazole for 3 weeks on, 1 week off.  JThayer Headingsinformed that [facility]will be changing to tablet formulation as of 02/10/2019, and will be dispensed in a box of 3 cards as opposed to current dispensing of a bottle with 21 capsules.  Patient will avoid grapefruit and grapefruit juice.  Ibrance start date: 01/19/2019  Adverse effects include but are not limited to: fatigue, hair loss, GI upset, nausea, decreased blood counts, and increased upper respiratory infections. Severe, life-threatening, and/or fatal interstitial lung disease (ILD) and/or pneumonitis may occur with CDK 4/6 inhibitors.  They will obtain anti diarrheal and alert the office of 4 or more loose stools above baseline.  JThayer Headingsinformed of WBC check prior to initiation of cycle 2 for dose and ANC assessment.  Reviewed importance of keeping a medication schedule and plan for any missed doses.  JThayer Headingsvoiced understanding and appreciation.   All questions answered. Medication reconciliation performed and medication/allergy list updated.  JThayer Headingswill pick-up her mom's [facility] [place], with the use of a manufacturer voucher to cover the costs of the 1st fill of Ibrance.  JThayer Headingswill be working with oral oncology patient advocate to complete manufacturer assistance application for continued [place] as patient does not have  prescription insurance coverage. JThayer Headingsplans to come to the office with income documents and sign application on Monday.  JThayer Headingsknows to call the office with questions or concerns. Oral Oncology Clinic will continue to follow.  JJohny Drilling PharmD, BCPS, [phone]/04/2019  9:57 AM

## 2019-01-12 NOTE — Telephone Encounter (Signed)
Oral Oncology Pharmacist Encounter  Received new prescription for palbociclib [facility]) for the treatment of HR+/ HER2- advanced stage IIIB invasive lobular breast cancer with new lung nodules concerning for metastatic disease in conjunction with neoadjuvant anastrazole, planned duration until disease progression or unacceptable toxicicty.   Plan is to hopefully reduce size of tumor so patient is able to undergo surgery as she is currently not a surgical candidate. Patient was recently undergoing chemotherapy with CMF and due to lack of response shown on recent MRI, this is being held. Patient started anastrazole approximately 1 week ago.  Labs from 01/08/2019 in Epic assessed, ok for treatment. Recommend continued monitoring. Prescription dose and frequency assessed, agree with dose.   Current medication list in Epic reviewed, two DDIs with palbociclib identified: -Risk Rating C: monitor therapy Palbociclib may increase serum concentration of CYP3A4 substrates including amlodipine and atorvastatin. No changes recommended at this time. Continue to monitor patient blood pressure closely and have patient report myopathies or other events that could be related to increased atorvastatin exposure.  Prescription has been e-scribed to the [facility] for benefits analysis and approval.  Oral Oncology Clinic will continue to follow for insurance authorization, copayment issues, initial counseling and start date.  Jalene Mullet, PharmD PGY2 Hematology/ Oncology Pharmacy Resident Oral [facility] [phone] 01/12/2019 1:54 PM

## 2019-01-13 ENCOUNTER — Telehealth: Payer: Self-pay | Admitting: General Practice

## 2019-01-13 NOTE — Telephone Encounter (Signed)
[place] CSW Progress Notes  Referred daughter to [employer] [place] [phone]) for information and referral for wheelchair ramp.    Edwyna Shell, LCSW Clinical Social Worker Phone:  [phone]

## 2019-01-13 NOTE — Telephone Encounter (Signed)
[place] CSW Progress Notes  Call from patient's daughter inquiring how to get wheelchair ramp installed in the home and how to get Medicare to pay for ramp.  Left VM for daughter, patient and family can be referred to community resources which can link them w agencies that install wheelchair ramps, can be referred to [place] [phone]).  Awaiting call back.  Edwyna Shell, LCSW Clinical Social Worker Phone:  [phone]

## 2019-01-15 MED FILL — IBRANCE 125 MG CAPSULE: 125 | 21 days supply | Qty: 21 | Fill #0

## 2019-01-18 ENCOUNTER — Telehealth: Payer: Self-pay

## 2019-01-18 NOTE — Telephone Encounter (Signed)
Oral Oncology Patient Advocate Encounter  The patient is currently uninsured. I can help the patient get her [name] [name] from [employer]. If approved for the program she will receive Ibrance shipped to her [name] free of charge.   I called the patient to discuss this with her and had to leave a message. The patients grand daughter, [name] [name] called me back and we discussed this. [name] [name] confirmed that [name] [name] does not prescription insurance. I went over the manufacturer assistance process, [name] [name] requested that I email her the application and she would have [name] [name] sign it and she will email it back to me. I emailed it today, 01/18/19.  In the meantime we have filled Ibrance with a one time free voucher at [facility] for [name] [name] to pick up.  [name] [name] verbalized understanding and great appreciation.   This encounter will be updated until final determination.  [name] [name] Phone [phone] Fax [phone] 01/18/2019   10:06 AM

## 2019-01-20 MED FILL — ATORVASTATIN 80 MG TABLET: 80 | 90 days supply | Qty: 90 | Fill #1

## 2019-01-20 MED FILL — LISINOPRIL-HCTZ 20-12.5 MG: 20-12.5 | 30 days supply | Qty: 30 | Fill #2

## 2019-01-25 MED FILL — CLOPIDOGREL 75 MG TABLET: 75 | 90 days supply | Qty: 90 | Fill #0

## 2019-01-26 NOTE — Telephone Encounter (Signed)
Oral Oncology Patient Advocate Encounter  I followed up with [name] [name] today and her plan is to bring the signed application and income document in to me tomorrow, 01/27/19.  [place] Patient [name] Phone [phone] Fax [phone] 01/26/2019   4:10 PM

## 2019-02-01 NOTE — Telephone Encounter (Signed)
Oral Oncology Patient Advocate Encounter  I met [name] in the lobby on Friday the 20th. [name] brought me the application signed by the patient but no income documentation.   [name] stated that she would bring Ms. Smalls tax return when they came for her appointment this Friday March 27th.  I will continue to update.  [place] Patient [place] Phone [phone] Fax [phone] 02/01/2019   9:05 AM

## 2019-02-04 NOTE — Progress Notes (Signed)
[place]  Telephone:([phone]) [phone] [phone]) [phone]    ID: [name] [name] DOB: Feb 21, 1946  MR#: [id number]  NID#:[id number]  Patient Care Team: [name], MD as PCP - General (Internal Medicine) Waynetta Sandy, MD as Consulting Physician (Vascular Surgery) Patsey Berthold, NP as Nurse Practitioner (Cardiology) [name], MD as Consulting Physician (Physical Medicine and Rehabilitation) [name], [name], NP as Nurse Practitioner (Vascular Surgery) Edgardo Roys, PsyD as Consulting Physician (Psychology) Mckynzi Cammon, [name], MD as Consulting Physician (Oncology) [name], MD as Consulting Physician (General Surgery) Eppie Gibson, MD as Attending Physician (Radiation Oncology) OTHER MD:   CHIEF COMPLAINT: Inflammatory breast cancer  CURRENT TREATMENT: Neoadjuvant anastrozole; definitive surgery pending   INTERVAL HISTORY: [name] [name] returns today for follow-up and treatment of her locally advanced breast cancer. She is accompanied by her daughter via face time  Since her last visit, she underwent chest CT scan on 01/11/2019, with results showing: grossly stable appearance of infiltrating tumor throughout the right breast; no thoracic adenopathy or other evidence of metastatic disease; stable chronic lung disease with scarring, bronchiectasis, and minimal nodularity.   She began on Ibrance on 01/19/2019.  She is tolerating this with no nausea or fatigue.  She continues on anastrozole, with good tolerance. She denies any side effects and specifically no significant hot flashes or vaginal dryness issues.   REVIEW OF SYSTEMS: [name] [name] has not noted any change in her usual functional status, which is admittedly limited.  She has not had any cough, fever or shortness of breath symptoms.  She has not been exposed to anyone that has traveled or have themselves been exposed to the novel coronavirus.  A detailed review of systems today was otherwise  stable  HISTORY OF CURRENT ILLNESS: [name] [name] presented with right breast discoloration and redness. The right breast also had an area of hardness and nipple retraction.  She did not bring this to medical attention but her daughter did mention it when the patient had neurologic follow-up on 08/10/2018.  At that time the nurse practitioner, [name], noted right nipple inversion, hard breast, and red non-ulcerated lesions on the breast.  The patient then underwent bilateral diagnostic mammography with tomography and right breast ultrasonography at [facility] on 08/11/2018 (this is her first ever mammogram) showing: breast density category B. There was a round 2.5 cm mass at the 1 o'clock upper inner quadrant of the right breast.  By palpation this measured approximately 10 cm.  Sonographically the irregular hypoechoic mass measured 2.7 cm. There was also a hypoechoic mass in the right breast at 9 o'clock upper outer quadrant measuring 3.8 cm. There was an abnormal right axillary lymph node with cortical thickening.   Accordingly on 08/19/2018 she proceeded to biopsy of the right breast areas in question. The pathology from this procedure showed [phone]): At the 1 o'clock: invasive lobular carcinoma, grade II. Prognostic indicators significant for: estrogen receptor, 60% positive with moderate staining intensity and progesterone receptor, 0% negative. Proliferation marker Ki67 at 20%. HER2 negative with an immunohistochemistry of (1+).  At the 9 o'clock: invasive lobular carcinoma, grade II. Prognostic indicators significant for: estrogen receptor, 90% positive with strong staining intensity and progesterone receptor, 0% negative. Proliferation marker Ki67 at 30%. HER2 negative with an immunohistochemistry of (1+).  The patient's subsequent history is as detailed below.    PAST MEDICAL HISTORY: Past Medical History:  Diagnosis Date   Anxiety    Cancer [facility])    breast cancer- right  Carotid stenosis    Depression    situational   Dyspnea    with much ambulation   GERD (gastroesophageal reflux disease)    Hypertension    Stroke ([place]) 04/01/2017   a little weakness right side leg and hand, a little memry loss    PAST SURGICAL HISTORY: Past Surgical History:  Procedure Laterality Date   ABDOMINAL HYSTERECTOMY     ENDARTERECTOMY Left 04/08/2017   Procedure: ENDARTERECTOMY CAROTID;  Surgeon: Waynetta Sandy, MD;  Location: [facility];  Service: Vascular;  Laterality: Left;   PATCH ANGIOPLASTY Left 04/08/2017   Procedure: PATCH ANGIOPLASTY Left Carotid;  Surgeon: Waynetta Sandy, MD;  Location: [place];  Service: Vascular;  Laterality: Left;   PORTACATH PLACEMENT Left 09/07/2018   Procedure: INSERTION PORT-A-CATH LEFT INTERNAL JUGULAR;  Surgeon: [name], MD;  Location: [place];  Service: General;  Laterality: Left;  total hysterectomy with bilateral salpingo-oophorectomy in her 50's no hormone replacement   FAMILY HISTORY: Family History  Problem Relation Age of Onset   Cancer Mother        ovarian   The patient's father is alive at age 47. The patient's mother died at age 1 due to ovarian cancer. The patient had 3 brothers and 3 sisters. She denies a history of breast, pancreatic, colon, or other cancers in the family.    GYNECOLOGIC HISTORY:  No LMP recorded. Patient has had a hysterectomy. Menarche: Age at first live birth: 73 years old She is GX P1.  She is status post total hysterectomy with BSO in her early 70's. She did not use HRT.    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. She is divorced. At home is just herself, but her two grandsons regularly come to visit and stay with her. The patient's daughter, [name] [name] works in administration for [employer]. The patient's 69 y.o. grandson is a Freight forwarder for [employer]. The patient's 79 y.o. grandson works in [employer].    ADVANCED DIRECTIVES:  Not in place.  At the 08/26/2018 visit the patient was given the appropriate documents to complete and notarized at her discretion.   HEALTH MAINTENANCE: Social History   Tobacco Use   Smoking status: Never Smoker   Smokeless tobacco: Never Used  Substance Use Topics   Alcohol use: No   Drug use: No     Colonoscopy: Never  PAP: Status post hysterectomy  Bone density: Never   No Known Allergies  Current Outpatient Medications  Medication Sig Dispense Refill   amLODipine (NORVASC) 10 MG tablet Take 1 tablet (10 mg total) by mouth daily. PATIENT MUST HAVE OFFICE VISIT AND LABS PRIOR TO ANY FURTHER REFILLS 15 tablet 0   anastrozole (ARIMIDEX) 1 MG tablet Take 1 tablet (1 mg total) by mouth daily. 90 tablet 4   atorvastatin (LIPITOR) 80 MG tablet Take 1 tablet (80 mg total) by mouth daily at 6 PM. 30 tablet 0   clopidogrel (PLAVIX) 75 MG tablet TAKE 1 TABLET BY MOUTH EVERY DAY 30 tablet 1   lisinopril-hydrochlorothiazide (PRINZIDE,ZESTORETIC) 20-12.5 MG tablet Take 1 tablet by mouth daily.  11   loratadine (CLARITIN) 10 MG tablet Take 10 mg by mouth daily as needed for allergies.     palbociclib (IBRANCE) 100 MG capsule Take 1 capsule (100 mg total) by mouth daily with breakfast. Take whole with food. Take for 21 days on, 7 days off, repeat every 28 days. 21 capsule 6   potassium chloride  SA (K-DUR,KLOR-CON) 20 MEQ tablet Take 1 tablet (20 mEq total) by mouth daily. 30 tablet 0   No current facility-administered medications for this visit.    Facility-Administered Medications Ordered in Other Visits  Medication Dose Route Frequency Provider Last Rate Last Dose   heparin lock flush 100 unit/mL  500 Units Intracatheter Once PRN Ronit Marczak, [name], MD       sodium chloride flush (NS) 0.9 % injection 10 mL  10 mL Intracatheter PRN Prakash Kimberling, [name], MD        OBJECTIVE: Older African-American woman in no acute distress  Vitals:   02/05/19 0934  BP: (!) 144/48  Pulse:  91  Resp: 18  Temp: 98.6 F (37 C)  SpO2: 100%     Body mass index is 29.47 kg/m.   Wt Readings from Last 3 Encounters:  02/05/19 182 lb 9.6 oz (82.8 kg)  01/08/19 179 lb 12.8 oz (81.6 kg)  12/18/18 181 lb 4.8 oz (82.2 kg)      ECOG FS:2 - Symptomatic, <50% confined to bed  Sclerae unicteric, pupils round and equal No cervical or supraclavicular adenopathy Lungs no rales or rhonchi Heart regular rate and rhythm Abd soft, nontender, positive bowel sounds MSK no focal spinal tenderness, no upper extremity lymphedema Neuro: nonfocal, well oriented, appropriate affect Breasts: The right breast is unchanged from baseline, which is imaged below.  The left breast is benign.  Both axillae are benign.  Right Breast 01/08/2019    Right Breast 08/26/2018    LAB RESULTS:  CMP     Component Value Date/Time   NA 142 02/05/2019 0921   K 3.5 02/05/2019 0921   CL 107 02/05/2019 0921   CO2 26 02/05/2019 0921   GLUCOSE 108 (H) 02/05/2019 0921   BUN 14 02/05/2019 0921   CREATININE 1.04 (H) 02/05/2019 0921   CREATININE 1.11 (H) 11/03/2018 1017   CALCIUM 9.3 02/05/2019 0921   PROT 7.9 02/05/2019 0921   ALBUMIN 4.1 02/05/2019 0921   AST 14 (L) 02/05/2019 0921   AST 32 11/03/2018 1017   ALT 16 02/05/2019 0921   ALT 48 (H) 11/03/2018 1017   ALKPHOS 103 02/05/2019 0921   BILITOT 0.5 02/05/2019 0921   BILITOT 0.5 11/03/2018 1017   GFRNONAA 54 (L) 02/05/2019 0921   GFRNONAA 50 (L) 11/03/2018 1017   GFRAA >60 02/05/2019 0921   GFRAA 58 (L) 11/03/2018 1017    No results found for: TOTALPROTELP, ALBUMINELP, A1GS, A2GS, BETS, BETA2SER, GAMS, MSPIKE, SPEI  No results found for: KPAFRELGTCHN, LAMBDASER, KAPLAMBRATIO  Lab Results  Component Value Date   WBC 2.1 (L) 02/05/2019   NEUTROABS 1.0 (L) 02/05/2019   HGB 11.2 (L) 02/05/2019   HCT 36.7 02/05/2019   MCV 84.4 02/05/2019   PLT 91 (L) 02/05/2019    _0 @  No results found for: LABCA2  No components found for:  OACZYS063  No results for input(s): INR in the last 168 hours.  No results found for: LABCA2  No results found for: KZS010  No results found for: XNA355  No results found for: DDU202  Lab Results  Component Value Date   CA2729 54.4 (H) 09/15/2018    No components found for: HGQUANT  No results found for: CEA1 / No results found for: CEA1   No results found for: AFPTUMOR  No results found for: CHROMOGRNA  No results found for: PSA1  Appointment on 02/05/2019  Component Date Value Ref Range Status   WBC 02/05/2019 2.1* 4.0 - 10.5  K/uL Final   RBC 02/05/2019 4.35  3.87 - 5.11 MIL/uL Final   Hemoglobin 02/05/2019 11.2* 12.0 - 15.0 g/dL Final   HCT 02/05/2019 36.7  36.0 - 46.0 % Final   MCV 02/05/2019 84.4  80.0 - 100.0 fL Final   MCH 02/05/2019 25.7* 26.0 - 34.0 pg Final   MCHC 02/05/2019 30.5  30.0 - 36.0 g/dL Final   RDW 02/05/2019 15.1  11.5 - 15.5 % Final   Platelets 02/05/2019 91* 150 - 400 K/uL Final   nRBC 02/05/2019 0.0  0.0 - 0.2 % Final   Neutrophils Relative % 02/05/2019 45  % Final   Neutro Abs 02/05/2019 1.0* 1.7 - 7.7 K/uL Final   Lymphocytes Relative 02/05/2019 44  % Final   Lymphs Abs 02/05/2019 0.9  0.7 - 4.0 K/uL Final   Monocytes Relative 02/05/2019 6  % Final   Monocytes Absolute 02/05/2019 0.1  0.1 - 1.0 K/uL Final   Eosinophils Relative 02/05/2019 3  % Final   Eosinophils Absolute 02/05/2019 0.1  0.0 - 0.5 K/uL Final   Basophils Relative 02/05/2019 1  % Final   Basophils Absolute 02/05/2019 0.0  0.0 - 0.1 K/uL Final   Immature Granulocytes 02/05/2019 1  % Final   Abs Immature Granulocytes 02/05/2019 0.01  0.00 - 0.07 K/uL Final   Acanthocytes 02/05/2019 PRESENT   Final   Schistocytes 02/05/2019 PRESENT   Final   Ovalocytes 02/05/2019 PRESENT   Final   Performed at [facility], [place] [address], [place], [place] [address]   Sodium 02/05/2019 142  135 - 145 mmol/L Final   Potassium 02/05/2019  3.5  3.5 - 5.1 mmol/L Final   Chloride 02/05/2019 107  98 - 111 mmol/L Final   CO2 02/05/2019 26  22 - 32 mmol/L Final   Glucose, Bld 02/05/2019 108* 70 - 99 mg/dL Final   BUN 02/05/2019 14  8 - 23 mg/dL Final   Creatinine, Ser 02/05/2019 1.04* 0.44 - 1.00 mg/dL Final   Calcium 02/05/2019 9.3  8.9 - 10.3 mg/dL Final   Total Protein 02/05/2019 7.9  6.5 - 8.1 g/dL Final   Albumin 02/05/2019 4.1  3.5 - 5.0 g/dL Final   AST 02/05/2019 14* 15 - 41 U/L Final   ALT 02/05/2019 16  0 - 44 U/L Final   Alkaline Phosphatase 02/05/2019 103  38 - 126 U/L Final   Total Bilirubin 02/05/2019 0.5  0.3 - 1.2 mg/dL Final   GFR calc non Af Amer 02/05/2019 54* >60 mL/min Final   GFR calc Af Amer 02/05/2019 >60  >60 mL/min Final   Anion gap 02/05/2019 9  5 - 15 Final   Performed at [facility], [place] [address] [place], [place] [address]    (this displays the last labs from the last 3 days)  No results found for: TOTALPROTELP, ALBUMINELP, A1GS, A2GS, BETS, BETA2SER, GAMS, MSPIKE, SPEI (this displays SPEP labs)  No results found for: KPAFRELGTCHN, LAMBDASER, KAPLAMBRATIO (kappa/lambda light chains)  No results found for: HGBA, HGBA2QUANT, HGBFQUANT, HGBSQUAN (Hemoglobinopathy evaluation)   No results found for: LDH  No results found for: IRON, TIBC, IRONPCTSAT (Iron and TIBC)  No results found for: FERRITIN  Urinalysis    Component Value Date/Time   COLORURINE AMBER (A) 04/01/2017 0920   APPEARANCEUR CLOUDY (A) 04/01/2017 0920   LABSPEC 1.021 04/01/2017 0920   PHURINE 5.0 04/01/2017 0920   GLUCOSEU NEGATIVE 04/01/2017 0920   HGBUR MODERATE (A) 04/01/2017 0920   BILIRUBINUR  NEGATIVE 04/01/2017 0920   KETONESUR 80 (A) 04/01/2017 0920   PROTEINUR NEGATIVE 04/01/2017 0920   NITRITE NEGATIVE 04/01/2017 0920   LEUKOCYTESUR LARGE (A) 04/01/2017 0920     STUDIES: Ct Chest W Contrast  Result Date: 01/11/2019 CLINICAL DATA:  Right breast cancer diagnosed 4  months ago. Oral chemotherapy ongoing. EXAM: CT CHEST WITH CONTRAST TECHNIQUE: Multidetector CT imaging of the chest was performed during intravenous contrast administration. CONTRAST:  60m OMNIPAQUE IOHEXOL 300 MG/ML  SOLN COMPARISON:  Bone scan 09/10/2018.  CT 09/01/2018. FINDINGS: Cardiovascular: No acute vascular findings are seen. There is atherosclerosis of the aorta, great vessels and coronary arteries. Left IJ Port-A-Cath extends to the superior cavoatrial junction. The heart size is normal. There is no pericardial effusion. Mediastinum/Nodes: There are no enlarged mediastinal, hilar, internal mammary or axillary lymph nodes. The thyroid gland, trachea and esophagus demonstrate no significant findings. Lungs/Pleura: There is no pleural effusion or pneumothorax. The lungs appear stable with mild biapical and bibasilar scarring, cylindrical bronchiectasis at both lung bases and Suh left upper lobe ground-glass nodules (5 mm on image 35/5 and 4 mm on image 59/5). No new or enlarging nodules. Upper abdomen: The visualized upper abdomen appears stable without suspicious findings. There is a probable gastric fundal diverticulum. Musculoskeletal/Chest wall: There is stable asymmetric nodular soft tissue thickening throughout the right breast, extending to the superficial surface of the pectoralis major muscle. There is associated dermal thickening. No new or enlarging chest wall mass is identified. There are stable degenerative changes throughout the spine associated with a mild scoliosis. IMPRESSION: 1. Grossly stable appearance of infiltrating tumor throughout the right breast (better evaluated by recent breast MRI). 2. No thoracic adenopathy or other evidence of metastatic disease. 3. Stable chronic lung disease with scarring, bronchiectasis and minimal nodularity. 4. Coronary and Aortic Atherosclerosis (ICD10-I70.0). Electronically Signed   By: WRichardean SaleM.D.   On: 01/11/2019 16:52    ELIGIBLE FOR  AVAILABLE RESEARCH PROTOCOL: no   ASSESSMENT: 73y.o. [place] status post right breast biopsy of overlapping sites on 08/19/2018 for a clinical T4 N1, stage IIIB invasive lobular carcinoma, estrogen receptor positive, progesterone receptor negative, HER-2 not amplified, with an MIB-1 of 20%.  (a) CT chest/abd/pelvis 09/02/2018 and bone scan 09/10/2018 show no evidence of stage IV disease  (b) breast MRI 09/14/2018 confirms a multicentric 12.6 cm right breast mass infiltrating pectpralis but w/o associated adenopathy  (1) genetics testing to be scheduled  (2) neoadjuvant chemotherapy consisting of cyclophosphamide, methotrexate and fluorouracil every 21 days x 8, started 09/16/2018, last dose 12/18/2018  (a) CMF chemotherapy stopped after 4 cycles, with no evidence of response by MRI  (3) definitive surgery pending  (4) adjuvant radiation to follow  (5) anastrozole started February 2019  (a) bone density to be obtained  (b) palbociclib added 01/19/2019 at 125 mg daily, 21/7  (c) palbociclib dose decreased to 100 mg daily 21 on 7 off, starting with second cycle, April 2020  (6) foundation 1/PD-L1 testing:  (a) PD-L1 requested 12/18/2018  (b) foundation one testing shows the microsatellite status to be stable, and the mutational burden to be 16/Mb  (c) there are potentially actionable mutations in AKT3, TSC1, KRAS  MAPK1 and RB1   PLAN: CEvaniais tolerating her anastrozole and palbociclib remarkably well.  Her counts are a bit on the low side and since we are not going to be able to follow her counts very closely because of the pandemic I am dropping her dose to  100 mg daily.  I have discussed this with our oral chemotherapy pharmacy specialist.  Hopefully we can get her medication mailed so they do not have to get out of the house unnecessarily.  I have asked him to call us with any new symptoms, otherwise I will see her in May and hopefully by that time we should be  seeing some effect in the right breast.  Bayli Quesinberry, [name], MD  02/05/19 10:31 AM Medical Oncology and Hematology [facility] [place], [place] [address] Tel. [phone]    Fax. [phone]   I, Wilburn Mylar, am acting as scribe for Dr. [name]. [name] [name].  I, Lurline Del MD, have reviewed the above documentation for accuracy and completeness, and I agree with the above.

## 2019-02-05 ENCOUNTER — Other Ambulatory Visit: Payer: Self-pay

## 2019-02-05 ENCOUNTER — Inpatient Hospital Stay: Payer: Medicare Other | Attending: Oncology | Admitting: Oncology

## 2019-02-05 ENCOUNTER — Inpatient Hospital Stay: Payer: Medicare Other

## 2019-02-05 ENCOUNTER — Encounter: Payer: Self-pay | Admitting: Pharmacist

## 2019-02-05 VITALS — BP 144/48 | HR 91 | Temp 98.6°F | Resp 18 | Ht 66.0 in | Wt 182.6 lb

## 2019-02-05 DIAGNOSIS — C50911 Malignant neoplasm of unspecified site of right female breast: Secondary | ICD-10-CM

## 2019-02-05 DIAGNOSIS — I63132 Cerebral infarction due to embolism of left carotid artery: Secondary | ICD-10-CM

## 2019-02-05 DIAGNOSIS — C50811 Malignant neoplasm of overlapping sites of right female breast: Secondary | ICD-10-CM

## 2019-02-05 DIAGNOSIS — Z170 Estrogen receptor positive status [ER+]: Secondary | ICD-10-CM | POA: Diagnosis not present

## 2019-02-05 DIAGNOSIS — I69359 Hemiplegia and hemiparesis following cerebral infarction affecting unspecified side: Secondary | ICD-10-CM

## 2019-02-05 LAB — CBC WITH DIFFERENTIAL/PLATELET
ABS IMMATURE GRANULOCYTES: 0.01 10*3/uL (ref 0.00–0.07)
Basophils Absolute: 0 10*3/uL (ref 0.0–0.1)
Basophils Relative: 1 %
Eosinophils Absolute: 0.1 10*3/uL (ref 0.0–0.5)
Eosinophils Relative: 3 %
HCT: 36.7 % (ref 36.0–46.0)
Hemoglobin: 11.2 g/dL — ABNORMAL LOW (ref 12.0–15.0)
Immature Granulocytes: 1 %
Lymphocytes Relative: 44 %
Lymphs Abs: 0.9 10*3/uL (ref 0.7–4.0)
MCH: 25.7 pg — ABNORMAL LOW (ref 26.0–34.0)
MCHC: 30.5 g/dL (ref 30.0–36.0)
MCV: 84.4 fL (ref 80.0–100.0)
MONOS PCT: 6 %
Monocytes Absolute: 0.1 10*3/uL (ref 0.1–1.0)
NEUTROS ABS: 1 10*3/uL — AB (ref 1.7–7.7)
NEUTROS PCT: 45 %
PLATELETS: 91 10*3/uL — AB (ref 150–400)
RBC: 4.35 MIL/uL (ref 3.87–5.11)
RDW: 15.1 % (ref 11.5–15.5)
WBC: 2.1 10*3/uL — ABNORMAL LOW (ref 4.0–10.5)
nRBC: 0 % (ref 0.0–0.2)

## 2019-02-05 LAB — COMPREHENSIVE METABOLIC PANEL
ALT: 16 U/L (ref 0–44)
AST: 14 U/L — ABNORMAL LOW (ref 15–41)
Albumin: 4.1 g/dL (ref 3.5–5.0)
Alkaline Phosphatase: 103 U/L (ref 38–126)
Anion gap: 9 (ref 5–15)
BUN: 14 mg/dL (ref 8–23)
CO2: 26 mmol/L (ref 22–32)
Calcium: 9.3 mg/dL (ref 8.9–10.3)
Chloride: 107 mmol/L (ref 98–111)
Creatinine, Ser: 1.04 mg/dL — ABNORMAL HIGH (ref 0.44–1.00)
GFR calc non Af Amer: 54 mL/min — ABNORMAL LOW (ref >60)
Glucose, Bld: 108 mg/dL — ABNORMAL HIGH (ref 70–99)
Potassium: 3.5 mmol/L (ref 3.5–5.1)
Sodium: 142 mmol/L (ref 135–145)
Total Bilirubin: 0.5 mg/dL (ref 0.3–1.2)
Total Protein: 7.9 g/dL (ref 6.5–8.1)

## 2019-02-05 MED ORDER — PALBOCICLIB 100 MG PO CAPS: 100.0000 mg | ORAL_CAPSULE | Freq: Every day | ORAL | 6 refills | Status: DC

## 2019-02-05 MED ORDER — ANASTROZOLE 1 MG PO TABS: 1.0000 mg | ORAL_TABLET | Freq: Every day | ORAL | 4 refills | Status: DC

## 2019-02-05 MED FILL — ANASTROZOLE 1 MG TABLET: 1 | 90 days supply | Qty: 90 | Fill #0

## 2019-02-05 NOTE — Telephone Encounter (Signed)
Oral Oncology Patient Advocate Encounter  Confirmed with [place] that Leslee Home was picked up on 01/20/19 with a one time free voucher.    [place] Patient [place] Phone [phone] Fax [phone] 02/05/2019   9:48 AM

## 2019-02-05 NOTE — Telephone Encounter (Signed)
Oral Oncology Patient Advocate Encounter  The patients income document was dropped off at the front desk today.  I faxed the completed application today to [phone].  This encounter will be updated until final determination.  [place] Patient [place] Phone [phone] Fax [phone] 02/05/2019   9:44 AM

## 2019-02-05 NOTE — Progress Notes (Signed)
Oral Chemotherapy Pharmacist Encounter   Spoke with patient and daughter today to follow up regarding patient's oral chemotherapy medication: palbociclib Leslee Home) for the treatment of lobular breast cancer in conjunction with anastrazole, planned duration until disease progression or unacceptable toxicicty.  Original Start date of oral chemotherapy: 01/25/2019  Pt reports 0 doses of Ibrance 125mg  capsules, 1 capsule by mouth once daily with food for 3 weeks on, 1 week off missed in the last 10 days.   Pt reports the following side effects: none to report  Pertinent labs reviewed: OK for continued treatment.  Other Issues:  Patient's ANC is 1.0 today, Ibrance dose will be decreased starting with cycle 2 to 100mg  daily for 21 days on, 7 days off I supplied patient with samples for 8 weeks of therapy  Medication: Ibrance 100mg  caosules Instructions: Take 1 capsule (100mg ) by mouth once daily with food, for 21 days on, 7 days off, repeated every 28 days Quantity dispensed: 42 Days supply: 56 Manufacturer: Pfizer Lot: [id number] Exp: 04/2019  Patient will finish current cycle of Ibrance 125mg  capsules, will take her 7 days off, and then continue on Ibrance 100mg  capsules.  They will go to the pharmacy today to pick up next fill of anastrozole. We will continue to update on status of manufacturer assistance application as patient does not have prescription insurance coverage.  Patient knows to call the office with questions or concerns. Oral Oncology Clinic will continue to follow.  Johny Drilling, PharmD, BCPS, BCOP  02/05/2019 10:58 AM Oral Oncology Clinic [phone]

## 2019-02-09 NOTE — Telephone Encounter (Signed)
Oral Oncology Patient Advocate Encounter  I followed up with [place] today and the application is in benefit verification.  This encounter will be updated until final determination.  [place] Patient [place] Phone [phone] Fax [phone] 02/09/2019   11:30 AM

## 2019-02-11 ENCOUNTER — Other Ambulatory Visit: Payer: Self-pay | Admitting: Oncology

## 2019-02-11 NOTE — Progress Notes (Signed)
[place]  Telephone:([phone]) [phone] Fax:(336) [phone]    ID: [name] [name] DOB: 1945-11-17  MR#: [id number]  VQQ#:[id number]  Patient Care Team: [name], MD as PCP - General (Internal Medicine) Waynetta Sandy, MD as Consulting Physician (Vascular Surgery) Patsey Berthold, NP as Nurse Practitioner (Cardiology) [name], MD as Consulting Physician (Physical Medicine and Rehabilitation) [name], [name], NP as Nurse Practitioner (Vascular Surgery) Edgardo Roys, PsyD as Consulting Physician (Psychology) Magrinat, [name], MD as Consulting Physician (Oncology) [name], MD as Consulting Physician (General Surgery) Eppie Gibson, MD as Attending Physician (Radiation Oncology) OTHER MD:   CHIEF COMPLAINT: Inflammatory breast cancer  CURRENT TREATMENT: Neoadjuvant anastrozole; definitive surgery pending   INTERVAL HISTORY: [name] [name] returns today for follow-up and treatment of her locally advanced breast cancer. She is accompanied by her daughter via face time  Since her last visit, she underwent chest CT scan on 01/11/2019, with results showing: grossly stable appearance of infiltrating tumor throughout the right breast; no thoracic adenopathy or other evidence of metastatic disease; stable chronic lung disease with scarring, bronchiectasis, and minimal nodularity.   She began on Ibrance on 01/19/2019.  She is tolerating this with no nausea or fatigue.  She continues on anastrozole, with good tolerance. She denies any side effects and specifically no significant hot flashes or vaginal dryness issues.   REVIEW OF SYSTEMS: [name] has not noted any change in her usual functional status, which is admittedly limited.  She has not had any cough, fever or shortness of breath symptoms.  She has not been exposed to anyone that has traveled or have themselves been exposed to the novel coronavirus.  A detailed review of systems today was otherwise  stable  HISTORY OF CURRENT ILLNESS: [name] [name] presented with right breast discoloration and redness. The right breast also had an area of hardness and nipple retraction.  She did not bring this to medical attention but her daughter did mention it when the patient had neurologic follow-up on 08/10/2018.  At that time the nurse practitioner, [name], noted right nipple inversion, hard breast, and red non-ulcerated lesions on the breast.  The patient then underwent bilateral diagnostic mammography with tomography and right breast ultrasonography at [facility] on 08/11/2018 (this is her first ever mammogram) showing: breast density category B. There was a round 2.5 cm mass at the 1 o'clock upper inner quadrant of the right breast.  By palpation this measured approximately 10 cm.  Sonographically the irregular hypoechoic mass measured 2.7 cm. There was also a hypoechoic mass in the right breast at 9 o'clock upper outer quadrant measuring 3.8 cm. There was an abnormal right axillary lymph node with cortical thickening.   Accordingly on 08/19/2018 she proceeded to biopsy of the right breast areas in question. The pathology from this procedure showed [phone]): At the 1 o'clock: invasive lobular carcinoma, grade II. Prognostic indicators significant for: estrogen receptor, 60% positive with moderate staining intensity and progesterone receptor, 0% negative. Proliferation marker Ki67 at 20%. HER2 negative with an immunohistochemistry of (1+).  At the 9 o'clock: invasive lobular carcinoma, grade II. Prognostic indicators significant for: estrogen receptor, 90% positive with strong staining intensity and progesterone receptor, 0% negative. Proliferation marker Ki67 at 30%. HER2 negative with an immunohistochemistry of (1+).  The patient's subsequent history is as detailed below.    PAST MEDICAL HISTORY: Past Medical History:  Diagnosis Date  . Anxiety   . Cancer [facility])    breast cancer- right  .  Carotid stenosis   . Depression    situational  . Dyspnea    with much ambulation  . GERD (gastroesophageal reflux disease)   . Hypertension   . Stroke ([place]) 04/01/2017   a little weakness right side leg and hand, a little memry loss    PAST SURGICAL HISTORY: Past Surgical History:  Procedure Laterality Date  . ABDOMINAL HYSTERECTOMY    . ENDARTERECTOMY Left 04/08/2017   Procedure: ENDARTERECTOMY CAROTID;  Surgeon: Waynetta Sandy, MD;  Location: [place];  Service: Vascular;  Laterality: Left;  . PATCH ANGIOPLASTY Left 04/08/2017   Procedure: PATCH ANGIOPLASTY Left Carotid;  Surgeon: Waynetta Sandy, MD;  Location: [place];  Service: Vascular;  Laterality: Left;  . PORTACATH PLACEMENT Left 09/07/2018   Procedure: INSERTION PORT-A-CATH LEFT INTERNAL JUGULAR;  Surgeon: [name], MD;  Location: [place];  Service: General;  Laterality: Left;  total hysterectomy with bilateral salpingo-oophorectomy in her 31's no hormone replacement   FAMILY HISTORY: Family History  Problem Relation Age of Onset  . Cancer Mother        ovarian   The patient's father is alive at age 33. The patient's mother died at age 38 due to ovarian cancer. The patient had 3 brothers and 3 sisters. She denies a history of breast, pancreatic, colon, or other cancers in the family.    GYNECOLOGIC HISTORY:  No LMP recorded. Patient has had a hysterectomy. Menarche: Age at first live birth: 73 years old She is GX P1.  She is status post total hysterectomy with BSO in her early 65's. She did not use HRT.    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. She is divorced. At home is just herself, but her two grandsons regularly come to visit and stay with her. The patient's daughter, [name] [name] works in administration for [employer]. The patient's 65 y.o. grandson is a Freight forwarder for [employer]. The patient's 49 y.o. grandson works in [employer].    ADVANCED DIRECTIVES:  Not in place.  At the 08/26/2018 visit the patient was given the appropriate documents to complete and notarized at her discretion.   HEALTH MAINTENANCE: Social History   Tobacco Use  . Smoking status: Never Smoker  . Smokeless tobacco: Never Used  Substance Use Topics  . Alcohol use: No  . Drug use: No     Colonoscopy: Never  PAP: Status post hysterectomy  Bone density: Never   No Known Allergies  Current Outpatient Medications  Medication Sig Dispense Refill  . amLODipine (NORVASC) 10 MG tablet Take 1 tablet (10 mg total) by mouth daily. PATIENT MUST HAVE OFFICE VISIT AND LABS PRIOR TO ANY FURTHER REFILLS 15 tablet 0  . anastrozole (ARIMIDEX) 1 MG tablet Take 1 tablet (1 mg total) by mouth daily. 90 tablet 4  . atorvastatin (LIPITOR) 80 MG tablet Take 1 tablet (80 mg total) by mouth daily at 6 PM. 30 tablet 0  . clopidogrel (PLAVIX) 75 MG tablet TAKE 1 TABLET BY MOUTH EVERY DAY 30 tablet 1  . lisinopril-hydrochlorothiazide (PRINZIDE,ZESTORETIC) 20-12.5 MG tablet Take 1 tablet by mouth daily.  11  . loratadine (CLARITIN) 10 MG tablet Take 10 mg by mouth daily as needed for allergies.    . palbociclib (IBRANCE) 100 MG capsule Take 1 capsule (100 mg total) by mouth daily with breakfast. Take whole with food. Take for 21 days on, 7 days off, repeat every 28 days. 21 capsule 6  . potassium chloride  SA (K-DUR,KLOR-CON) 20 MEQ tablet Take 1 tablet (20 mEq total) by mouth daily. 30 tablet 0   No current facility-administered medications for this visit.    Facility-Administered Medications Ordered in Other Visits  Medication Dose Route Frequency Provider Last Rate Last Dose  . heparin lock flush 100 unit/mL  500 Units Intracatheter Once PRN , [name], MD      . sodium chloride flush (NS) 0.9 % injection 10 mL  10 mL Intracatheter PRN , [name], MD        OBJECTIVE: Older African-American woman in no acute distress  There were no vitals filed for this visit.   There  is no height or weight on file to calculate BMI.   Wt Readings from Last 3 Encounters:  02/05/19 182 lb 9.6 oz (82.8 kg)  01/08/19 179 lb 12.8 oz (81.6 kg)  12/18/18 181 lb 4.8 oz (82.2 kg)      ECOG FS:2 - Symptomatic, <50% confined to bed  Sclerae unicteric, pupils round and equal No cervical or supraclavicular adenopathy Lungs no rales or rhonchi Heart regular rate and rhythm Abd soft, nontender, positive bowel sounds MSK no focal spinal tenderness, no upper extremity lymphedema Neuro: nonfocal, well oriented, appropriate affect Breasts: The right breast is unchanged from baseline, which is imaged below.  The left breast is benign.  Both axillae are benign.  Right Breast 01/08/2019    Right Breast 08/26/2018    LAB RESULTS:  CMP     Component Value Date/Time   NA 142 02/05/2019 0921   K 3.5 02/05/2019 0921   CL 107 02/05/2019 0921   CO2 26 02/05/2019 0921   GLUCOSE 108 (H) 02/05/2019 0921   BUN 14 02/05/2019 0921   CREATININE 1.04 (H) 02/05/2019 0921   CREATININE 1.11 (H) 11/03/2018 1017   CALCIUM 9.3 02/05/2019 0921   PROT 7.9 02/05/2019 0921   ALBUMIN 4.1 02/05/2019 0921   AST 14 (L) 02/05/2019 0921   AST 32 11/03/2018 1017   ALT 16 02/05/2019 0921   ALT 48 (H) 11/03/2018 1017   ALKPHOS 103 02/05/2019 0921   BILITOT 0.5 02/05/2019 0921   BILITOT 0.5 11/03/2018 1017   GFRNONAA 54 (L) 02/05/2019 0921   GFRNONAA 50 (L) 11/03/2018 1017   GFRAA >60 02/05/2019 0921   GFRAA 58 (L) 11/03/2018 1017    No results found for: TOTALPROTELP, ALBUMINELP, A1GS, A2GS, BETS, BETA2SER, GAMS, MSPIKE, SPEI  No results found for: KPAFRELGTCHN, LAMBDASER, KAPLAMBRATIO  Lab Results  Component Value Date   WBC 2.1 (L) 02/05/2019   NEUTROABS 1.0 (L) 02/05/2019   HGB 11.2 (L) 02/05/2019   HCT 36.7 02/05/2019   MCV 84.4 02/05/2019   PLT 91 (L) 02/05/2019    _0 @  No results found for: LABCA2  No components found for: DSKAJG811  No results for input(s):  INR in the last 168 hours.  No results found for: LABCA2  No results found for: XBW620  No results found for: BTD974  No results found for: BUL845  Lab Results  Component Value Date   CA2729 54.4 (H) 09/15/2018    No components found for: HGQUANT  No results found for: CEA1 / No results found for: CEA1   No results found for: AFPTUMOR  No results found for: CHROMOGRNA  No results found for: PSA1  No visits with results within 3 Day(s) from this visit.  Latest known visit with results is:  Appointment on 02/05/2019  Component Date Value Ref Range Status  . WBC  02/05/2019 2.1* 4.0 - 10.5 K/uL Final  . RBC 02/05/2019 4.35  3.87 - 5.11 MIL/uL Final  . Hemoglobin 02/05/2019 11.2* 12.0 - 15.0 g/dL Final  . HCT 02/05/2019 36.7  36.0 - 46.0 % Final  . MCV 02/05/2019 84.4  80.0 - 100.0 fL Final  . MCH 02/05/2019 25.7* 26.0 - 34.0 pg Final  . MCHC 02/05/2019 30.5  30.0 - 36.0 g/dL Final  . RDW 02/05/2019 15.1  11.5 - 15.5 % Final  . Platelets 02/05/2019 91* 150 - 400 K/uL Final  . nRBC 02/05/2019 0.0  0.0 - 0.2 % Final  . Neutrophils Relative % 02/05/2019 45  % Final  . Neutro Abs 02/05/2019 1.0* 1.7 - 7.7 K/uL Final  . Lymphocytes Relative 02/05/2019 44  % Final  . Lymphs Abs 02/05/2019 0.9  0.7 - 4.0 K/uL Final  . Monocytes Relative 02/05/2019 6  % Final  . Monocytes Absolute 02/05/2019 0.1  0.1 - 1.0 K/uL Final  . Eosinophils Relative 02/05/2019 3  % Final  . Eosinophils Absolute 02/05/2019 0.1  0.0 - 0.5 K/uL Final  . Basophils Relative 02/05/2019 1  % Final  . Basophils Absolute 02/05/2019 0.0  0.0 - 0.1 K/uL Final  . Immature Granulocytes 02/05/2019 1  % Final  . Abs Immature Granulocytes 02/05/2019 0.01  0.00 - 0.07 K/uL Final  . Acanthocytes 02/05/2019 PRESENT   Final  . Schistocytes 02/05/2019 PRESENT   Final  . Ovalocytes 02/05/2019 PRESENT   Final   Performed at [facility], [place] [address], [place], [place] [address]  . Sodium  02/05/2019 142  135 - 145 mmol/L Final  . Potassium 02/05/2019 3.5  3.5 - 5.1 mmol/L Final  . Chloride 02/05/2019 107  98 - 111 mmol/L Final  . CO2 02/05/2019 26  22 - 32 mmol/L Final  . Glucose, Bld 02/05/2019 108* 70 - 99 mg/dL Final  . BUN 02/05/2019 14  8 - 23 mg/dL Final  . Creatinine, Ser 02/05/2019 1.04* 0.44 - 1.00 mg/dL Final  . Calcium 02/05/2019 9.3  8.9 - 10.3 mg/dL Final  . Total Protein 02/05/2019 7.9  6.5 - 8.1 g/dL Final  . Albumin 02/05/2019 4.1  3.5 - 5.0 g/dL Final  . AST 02/05/2019 14* 15 - 41 U/L Final  . ALT 02/05/2019 16  0 - 44 U/L Final  . Alkaline Phosphatase 02/05/2019 103  38 - 126 U/L Final  . Total Bilirubin 02/05/2019 0.5  0.3 - 1.2 mg/dL Final  . GFR calc non Af Amer 02/05/2019 54* >60 mL/min Final  . GFR calc Af Amer 02/05/2019 >60  >60 mL/min Final  . Anion gap 02/05/2019 9  5 - 15 Final   Performed at [facility], [place] [address] [place], [place] [address]    (this displays the last labs from the last 3 days)  No results found for: TOTALPROTELP, ALBUMINELP, A1GS, A2GS, BETS, BETA2SER, GAMS, MSPIKE, SPEI (this displays SPEP labs)  No results found for: KPAFRELGTCHN, LAMBDASER, KAPLAMBRATIO (kappa/lambda light chains)  No results found for: HGBA, HGBA2QUANT, HGBFQUANT, HGBSQUAN (Hemoglobinopathy evaluation)   No results found for: LDH  No results found for: IRON, TIBC, IRONPCTSAT (Iron and TIBC)  No results found for: FERRITIN  Urinalysis    Component Value Date/Time   COLORURINE AMBER (A) 04/01/2017 0920   APPEARANCEUR CLOUDY (A) 04/01/2017 0920   LABSPEC 1.021 04/01/2017 0920   PHURINE 5.0 04/01/2017 0920   GLUCOSEU NEGATIVE 04/01/2017 0920   HGBUR MODERATE (A)  04/01/2017 0920   BILIRUBINUR NEGATIVE 04/01/2017 0920   KETONESUR 80 (A) 04/01/2017 0920   PROTEINUR NEGATIVE 04/01/2017 0920   NITRITE NEGATIVE 04/01/2017 0920   LEUKOCYTESUR LARGE (A) 04/01/2017 0920     STUDIES: No results found.  ELIGIBLE  FOR AVAILABLE RESEARCH PROTOCOL: no   ASSESSMENT: 73 y.o. [place], [place] woman status post right breast biopsy of overlapping sites on 08/19/2018 for a clinical T4 N1, stage IIIB invasive lobular carcinoma, estrogen receptor positive, progesterone receptor negative, HER-2 not amplified, with an MIB-1 of 20%.  (a) CT chest/abd/pelvis 09/02/2018 and bone scan 09/10/2018 show no evidence of stage IV disease  (b) breast MRI 09/14/2018 confirms a multicentric 12.6 cm right breast mass infiltrating pectpralis but w/o associated adenopathy  (1) genetics testing to be scheduled  (2) neoadjuvant chemotherapy consisting of cyclophosphamide, methotrexate and fluorouracil every 21 days x 8, started 09/16/2018, last dose 12/18/2018  (a) CMF chemotherapy stopped after 4 cycles, with no evidence of response by MRI  (3) definitive surgery pending  (4) adjuvant radiation to follow  (5) anastrozole started February 2019  (a) bone density to be obtained  (b) palbociclib added 01/19/2019 at 125 mg daily, 21/7  (c) palbociclib dose decreased to 100 mg daily 21 on 7 off, starting with second cycle, April 2020  (6) foundation 1/PD-L1 testing:  (a) PD-L1 requested 12/18/2018--insufficient tissue  (b) foundation one testing shows the microsatellite status to be stable, and the mutational burden to be 16/Mb  (c) there are potentially actionable mutations in AKT3, TSC1, KRAS  MAPK1 and RB1   PLAN: Avilyn is tolerating her anastrozole and palbociclib remarkably well.  Her counts are a bit on the low side and since we are not going to be able to follow her counts very closely because of the pandemic I am dropping her dose to 100 mg daily.  I have discussed this with our oral chemotherapy pharmacy specialist.  Hopefully we can get her medication mailed so they do not have to get out of the house unnecessarily.  I have asked him to call us with any new symptoms, otherwise I will see her in May and hopefully by  that time we should be seeing some effect in the right breast.  Magrinat, [name], MD  02/11/19 8:26 AM Medical Oncology and Hematology [facility] [place], [place] [address] Tel. [phone]    Fax. [phone]   I, Wilburn Mylar, am acting as scribe for Dr. [name]. Magrinat.  I, Lurline Del MD, have reviewed the above documentation for accuracy and completeness, and I agree with the above.

## 2019-02-11 NOTE — Telephone Encounter (Signed)
Oral Oncology Patient Advocate Encounter  Received notification from [place] Patient Assistance program that patient has been successfully enrolled into their program to receive Ibrance from the manufacturer at $0 out of pocket until 02/10/20.    I called and spoke with patients daughter, [name].  She knows we will have to re-apply.   Patient knows to call the office with questions or concerns.   Oral Oncology Clinic will continue to follow.  [place] [facility] [place] Phone [phone] Fax [phone] 02/11/2019 8:01 AM

## 2019-02-17 ENCOUNTER — Encounter: Payer: Self-pay | Admitting: General Practice

## 2019-02-17 NOTE — Progress Notes (Signed)
[place] Team contacted patient to assess for food insecurity and other psychosocial needs during current COVID19 pandemic.  VM left on daughters phone which is listed as primary contact.    Beverely Pace, [place]

## 2019-02-22 MED FILL — LISINOPRIL-HCTZ 20-12.5 MG: 20-12.5 | 90 days supply | Qty: 90 | Fill #0

## 2019-02-24 ENCOUNTER — Other Ambulatory Visit: Payer: Self-pay

## 2019-02-24 MED ORDER — PALBOCICLIB 100 MG PO CAPS: 100.0000 mg | ORAL_CAPSULE | Freq: Every day | ORAL | 3 refills | Status: DC

## 2019-03-15 ENCOUNTER — Telehealth: Payer: Self-pay | Admitting: *Deleted

## 2019-03-15 NOTE — Telephone Encounter (Signed)
Daughter called and is requesting summary of her mother's visits with Dr Naaman Plummer to help her obtain ramp and other services for senior who is disabled. I have forwarded message to medical records-[place].

## 2019-03-18 ENCOUNTER — Other Ambulatory Visit: Payer: Self-pay | Admitting: *Deleted

## 2019-03-18 NOTE — Telephone Encounter (Signed)
Message left by pt's daughter requesting a return call regarding " when is my mother's next appointment ".  Call returned, obtained identified VM. Message left with appointment dates and times.

## 2019-04-06 NOTE — Progress Notes (Signed)
[place]  [phone] [phone]    ID: [name] [name] DOB: 03/13/1946  MR#: [id number]  FFM#:[id number]  Patient Care Team: [name], MD as PCP - General (Internal Medicine) Waynetta Sandy, MD as Consulting Physician (Vascular Surgery) Patsey Berthold, NP as Nurse Practitioner (Cardiology) [name], MD as Consulting Physician (Physical Medicine and Rehabilitation) [name], [name], NP as Nurse Practitioner (Vascular Surgery) Edgardo Roys, PsyD as Consulting Physician (Psychology) , [name], MD as Consulting Physician (Oncology) [name], MD as Consulting Physician (General Surgery) Eppie Gibson, MD as Attending Physician (Radiation Oncology) OTHER MD:   CHIEF COMPLAINT: Inflammatory breast cancer  CURRENT TREATMENT: Neoadjuvant anastrozole; definitive surgery pending   INTERVAL HISTORY: [name] [name] returns today for follow-up and treatment of her locally advanced breast cancer. She is accompanied by her daughter via face time.  She continues on anastrozole, with good tolerance. She reports occasional very mild hot flashes and denies vaginal dryness.  She also continues on Ibrance with good tolerance. She reports some fatigue and denies nausea. She is currently at the end of the second week.  Since her last visit here, she has not undergone any additional studies.   REVIEW OF SYSTEMS: [name] [name] reports doing well overall. She stays at home if at all possible; her daughter and grandson are currently living with her. Her daughter does the shopping for them. Her daughter is working from home, and her grandson works outside the home. The patient denies unusual headaches, visual changes, nausea, vomiting, stiff neck, dizziness, or gait imbalance. There has been no cough, phlegm production, or pleurisy, no chest pain or pressure, and no change in bowel or bladder habits. The patient denies fever, rash,  bleeding, unexplained fatigue or unexplained weight loss. A detailed review of systems was otherwise entirely negative.   HISTORY OF CURRENT ILLNESS: From the original intake note:  [name] [name] presented with right breast discoloration and redness. The right breast also had an area of hardness and nipple retraction.  She did not bring this to medical attention but her daughter did mention it when the patient had neurologic follow-up on 08/10/2018.  At that time the nurse practitioner, [name], noted right nipple inversion, hard breast, and red non-ulcerated lesions on the breast.  The patient then underwent bilateral diagnostic mammography with tomography and right breast ultrasonography at [facility] on 08/11/2018 (this is her first ever mammogram) showing: breast density category B. There was a round 2.5 cm mass at the 1 o'clock upper inner quadrant of the right breast.  By palpation this measured approximately 10 cm.  Sonographically the irregular hypoechoic mass measured 2.7 cm. There was also a hypoechoic mass in the right breast at 9 o'clock upper outer quadrant measuring 3.8 cm. There was an abnormal right axillary lymph node with cortical thickening.   Accordingly on 08/19/2018 she proceeded to biopsy of the right breast areas in question. The pathology from this procedure showed [id number]): At the 1 o'clock: invasive lobular carcinoma, grade II. Prognostic indicators significant for: estrogen receptor, 60% positive with moderate staining intensity and progesterone receptor, 0% negative. Proliferation marker Ki67 at 20%. HER2 negative with an immunohistochemistry of (1+).  At the 9 o'clock: invasive lobular carcinoma, grade II. Prognostic indicators significant for: estrogen receptor, 90% positive with strong staining intensity and progesterone receptor, 0% negative. Proliferation marker Ki67 at 30%. HER2 negative with an immunohistochemistry of (1+).  The patient's subsequent history is as  detailed below.   PAST MEDICAL HISTORY: Past  Medical History:  Diagnosis Date  . Anxiety   . Cancer [facility])    breast cancer- right  . Carotid stenosis   . Depression    situational  . Dyspnea    with much ambulation  . GERD (gastroesophageal reflux disease)   . Hypertension   . Stroke ([employer]) 04/01/2017   a little weakness right side leg and hand, a little memry loss    PAST SURGICAL HISTORY: Past Surgical History:  Procedure Laterality Date  . ABDOMINAL HYSTERECTOMY    . ENDARTERECTOMY Left 04/08/2017   Procedure: ENDARTERECTOMY CAROTID;  Surgeon: Waynetta Sandy, MD;  Location: [place];  Service: Vascular;  Laterality: Left;  . PATCH ANGIOPLASTY Left 04/08/2017   Procedure: PATCH ANGIOPLASTY Left Carotid;  Surgeon: Waynetta Sandy, MD;  Location: [place];  Service: Vascular;  Laterality: Left;  . PORTACATH PLACEMENT Left 09/07/2018   Procedure: INSERTION PORT-A-CATH LEFT INTERNAL JUGULAR;  Surgeon: [name], MD;  Location: [place];  Service: General;  Laterality: Left;  total hysterectomy with bilateral salpingo-oophorectomy in her 77's no hormone replacement   FAMILY HISTORY: Family History  Problem Relation Age of Onset  . Cancer Mother        ovarian   The patient's father is alive at age 41. The patient's mother died at age 25 due to ovarian cancer. The patient had 3 brothers and 3 sisters. She denies a history of breast, pancreatic, colon, or other cancers in the family.    GYNECOLOGIC HISTORY:  No LMP recorded. Patient has had a hysterectomy. Menarche: Age at first live birth: 73 years old She is GX P1.  She is status post total hysterectomy with BSO in her early 38's. She did not use HRT.    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. She is divorced. At home is just herself, but her two grandsons regularly come to visit and stay with her. The patient's daughter, [name] works in administration for [employer]. The patient's 59 y.o. grandson is a Freight forwarder for [employer]. The patient's 85 y.o. grandson works in [employer].    ADVANCED DIRECTIVES: Not in place.  At the 08/26/2018 visit the patient was given the appropriate documents to complete and notarized at her discretion.   HEALTH MAINTENANCE: Social History   Tobacco Use  . Smoking status: Never Smoker  . Smokeless tobacco: Never Used  Substance Use Topics  . Alcohol use: No  . Drug use: No     Colonoscopy: Never  PAP: Status post hysterectomy  Bone density: Never   No Known Allergies  Current Outpatient Medications  Medication Sig Dispense Refill  . amLODipine (NORVASC) 10 MG tablet Take 1 tablet (10 mg total) by mouth daily. PATIENT MUST HAVE OFFICE VISIT AND LABS PRIOR TO ANY FURTHER REFILLS 15 tablet 0  . anastrozole (ARIMIDEX) 1 MG tablet Take 1 tablet (1 mg total) by mouth daily. 90 tablet 4  . atorvastatin (LIPITOR) 80 MG tablet Take 1 tablet (80 mg total) by mouth daily at 6 PM. 30 tablet 0  . clopidogrel (PLAVIX) 75 MG tablet TAKE 1 TABLET BY MOUTH EVERY DAY 30 tablet 1  . lisinopril-hydrochlorothiazide (PRINZIDE,ZESTORETIC) 20-12.5 MG tablet Take 1 tablet by mouth daily.  11  . loratadine (CLARITIN) 10 MG tablet Take 10 mg by mouth daily as needed for allergies.    . palbociclib (IBRANCE) 100 MG capsule Take 1 capsule (100 mg total) by mouth daily with breakfast. Take whole  with food. Take for 21 days on, 7 days off, repeat every 28 days. 21 capsule 3  . potassium chloride SA (K-DUR,KLOR-CON) 20 MEQ tablet Take 1 tablet (20 mEq total) by mouth daily. 30 tablet 0   No current facility-administered medications for this visit.    Facility-Administered Medications Ordered in Other Visits  Medication Dose Route Frequency Provider Last Rate Last Dose  . heparin lock flush 100 unit/mL  500 Units Intracatheter Once PRN , [name], MD      . sodium chloride flush (NS) 0.9 % injection 10 mL  10 mL Intracatheter  PRN , [name], MD        OBJECTIVE: Older African-American woman in no acute distress  Vitals:   04/07/19 0937  BP: (!) 147/52  Pulse: (!) 105  Resp: 18  Temp: 99.4 F (37.4 C)  SpO2: 99%     Body mass index is 29.99 kg/m.   Wt Readings from Last 3 Encounters:  04/07/19 185 lb 12.8 oz (84.3 kg)  02/05/19 182 lb 9.6 oz (82.8 kg)  01/08/19 179 lb 12.8 oz (81.6 kg)      ECOG FS:2 - Symptomatic, <50% confined to bed  Sclerae unicteric, EOMs intact No cervical or supraclavicular adenopathy Lungs no rales or rhonchi Heart regular rate and rhythm Abd soft, nontender, positive bowel sounds MSK no focal spinal tenderness, no upper extremity lymphedema Neuro: nonfocal, well oriented, appropriate affect Breasts: The right breast is essentially unchanged.  It is imaged below.  I do not palpate any right axillary adenopathy.  The left breast is benign.  The left axilla is benign.   Right Breast 04/07/2019     Right Breast 01/08/2019    Right Breast 08/26/2018    LAB RESULTS:  CMP     Component Value Date/Time   NA 141 04/07/2019 0910   K 3.6 04/07/2019 0910   CL 104 04/07/2019 0910   CO2 28 04/07/2019 0910   GLUCOSE 125 (H) 04/07/2019 0910   BUN 14 04/07/2019 0910   CREATININE 1.13 (H) 04/07/2019 0910   CREATININE 1.11 (H) 11/03/2018 1017   CALCIUM 9.3 04/07/2019 0910   PROT 7.7 04/07/2019 0910   ALBUMIN 3.7 04/07/2019 0910   AST 12 (L) 04/07/2019 0910   AST 32 11/03/2018 1017   ALT 14 04/07/2019 0910   ALT 48 (H) 11/03/2018 1017   ALKPHOS 90 04/07/2019 0910   BILITOT 0.4 04/07/2019 0910   BILITOT 0.5 11/03/2018 1017   GFRNONAA 49 (L) 04/07/2019 0910   GFRNONAA 50 (L) 11/03/2018 1017   GFRAA 57 (L) 04/07/2019 0910   GFRAA 58 (L) 11/03/2018 1017    No results found for: TOTALPROTELP, ALBUMINELP, A1GS, A2GS, BETS, BETA2SER, GAMS, MSPIKE, SPEI  No results found for: KPAFRELGTCHN, LAMBDASER, KAPLAMBRATIO  Lab Results  Component Value Date   WBC  3.7 (L) 04/07/2019   NEUTROABS 2.2 04/07/2019   HGB 10.5 (L) 04/07/2019   HCT 33.7 (L) 04/07/2019   MCV 87.1 04/07/2019   PLT 151 04/07/2019    _0 @  No results found for: LABCA2  No components found for: JFHLKT625  No results for input(s): INR in the last 168 hours.  No results found for: LABCA2  No results found for: WLS937  No results found for: DSK876  No results found for: OTL572  Lab Results  Component Value Date   CA2729 54.4 (H) 09/15/2018    No components found for: HGQUANT  No results found for: CEA1 / No results found for: CEA1  No results found for: AFPTUMOR  No results found for: [place]  No results found for: PSA1  Appointment on 04/07/2019  Component Date Value Ref Range Status  . Sodium 04/07/2019 141  135 - 145 mmol/L Final  . Potassium 04/07/2019 3.6  3.5 - 5.1 mmol/L Final  . Chloride 04/07/2019 104  98 - 111 mmol/L Final  . CO2 04/07/2019 28  22 - 32 mmol/L Final  . Glucose, Bld 04/07/2019 125* 70 - 99 mg/dL Final  . BUN 04/07/2019 14  8 - 23 mg/dL Final  . Creatinine, Ser 04/07/2019 1.13* 0.44 - 1.00 mg/dL Final  . Calcium 04/07/2019 9.3  8.9 - 10.3 mg/dL Final  . Total Protein 04/07/2019 7.7  6.5 - 8.1 g/dL Final  . Albumin 04/07/2019 3.7  3.5 - 5.0 g/dL Final  . AST 04/07/2019 12* 15 - 41 U/L Final  . ALT 04/07/2019 14  0 - 44 U/L Final  . Alkaline Phosphatase 04/07/2019 90  38 - 126 U/L Final  . Total Bilirubin 04/07/2019 0.4  0.3 - 1.2 mg/dL Final  . GFR calc non Af Amer 04/07/2019 49* >60 mL/min Final  . GFR calc Af Amer 04/07/2019 57* >60 mL/min Final  . Anion gap 04/07/2019 9  5 - 15 Final   Performed at [facility], [place] [address], [place], [place] [address]  . WBC 04/07/2019 3.7* 4.0 - 10.5 K/uL Final  . RBC 04/07/2019 3.87  3.87 - 5.11 MIL/uL Final  . Hemoglobin 04/07/2019 10.5* 12.0 - 15.0 g/dL Final  . HCT 04/07/2019 33.7* 36.0 - 46.0 % Final  . MCV 04/07/2019 87.1  80.0 - 100.0 fL  Final  . MCH 04/07/2019 27.1  26.0 - 34.0 pg Final  . MCHC 04/07/2019 31.2  30.0 - 36.0 g/dL Final  . RDW 04/07/2019 18.5* 11.5 - 15.5 % Final  . Platelets 04/07/2019 151  150 - 400 K/uL Final  . nRBC 04/07/2019 0.0  0.0 - 0.2 % Final  . Neutrophils Relative % 04/07/2019 59  % Final  . Neutro Abs 04/07/2019 2.2  1.7 - 7.7 K/uL Final  . Lymphocytes Relative 04/07/2019 28  % Final  . Lymphs Abs 04/07/2019 1.0  0.7 - 4.0 K/uL Final  . Monocytes Relative 04/07/2019 11  % Final  . Monocytes Absolute 04/07/2019 0.4  0.1 - 1.0 K/uL Final  . Eosinophils Relative 04/07/2019 1  % Final  . Eosinophils Absolute 04/07/2019 0.0  0.0 - 0.5 K/uL Final  . Basophils Relative 04/07/2019 1  % Final  . Basophils Absolute 04/07/2019 0.1  0.0 - 0.1 K/uL Final  . Immature Granulocytes 04/07/2019 0  % Final  . Abs Immature Granulocytes 04/07/2019 0.01  0.00 - 0.07 K/uL Final  . Acanthocytes 04/07/2019 PRESENT   Final  . Ovalocytes 04/07/2019 PRESENT   Final   Performed at [facility], [place] [address] [place], [place] [address]    (this displays the last labs from the last 3 days)  No results found for: TOTALPROTELP, ALBUMINELP, A1GS, A2GS, BETS, BETA2SER, GAMS, MSPIKE, SPEI (this displays SPEP labs)  No results found for: KPAFRELGTCHN, LAMBDASER, KAPLAMBRATIO (kappa/lambda light chains)  No results found for: HGBA, HGBA2QUANT, HGBFQUANT, HGBSQUAN (Hemoglobinopathy evaluation)   No results found for: LDH  No results found for: IRON, TIBC, IRONPCTSAT (Iron and TIBC)  No results found for: FERRITIN  Urinalysis    Component Value Date/Time   COLORURINE AMBER (A) 04/01/2017 0920   APPEARANCEUR CLOUDY (A) 04/01/2017 0920  LABSPEC 1.021 04/01/2017 0920   PHURINE 5.0 04/01/2017 0920   GLUCOSEU NEGATIVE 04/01/2017 0920   HGBUR MODERATE (A) 04/01/2017 0920   BILIRUBINUR NEGATIVE 04/01/2017 0920   KETONESUR 80 (A) 04/01/2017 0920   PROTEINUR NEGATIVE 04/01/2017 0920    NITRITE NEGATIVE 04/01/2017 0920   LEUKOCYTESUR LARGE (A) 04/01/2017 0920     STUDIES: No results found.  ELIGIBLE FOR AVAILABLE RESEARCH PROTOCOL: no   ASSESSMENT: 73 y.o. [place], [place] woman status post right breast biopsy of overlapping sites on 08/19/2018 for a clinical T4 N1, stage IIIB invasive lobular carcinoma, estrogen receptor positive, progesterone receptor negative, HER-2 not amplified, with an MIB-1 of 20%.  (a) CT chest/abd/pelvis 09/02/2018 and bone scan 09/10/2018 show no evidence of stage IV disease  (b) breast MRI 09/14/2018 confirms a multicentric 12.6 cm right breast mass infiltrating pectpralis but w/o associated adenopathy  (1) genetics testing to be scheduled  (2) neoadjuvant chemotherapy consisting of cyclophosphamide, methotrexate and fluorouracil every 21 days x 8, started 09/16/2018, last dose 12/18/2018  (a) CMF chemotherapy stopped after 4 cycles, with no evidence of response by MRI  (3) definitive surgery pending  (4) adjuvant radiation to follow  (5) anastrozole started February 2019  (a) bone density to be obtained  (b) palbociclib added 01/19/2019 at 125 mg daily, 21/7  (c) palbociclib dose decreased to 100 mg daily 21 on 7 off, starting with second cycle, April 2020  (6) foundation 1/PD-L1 testing:  (a) PD-L1 requested 12/18/2018--insufficient tissue  (b) foundation one testing shows the microsatellite status to be stable, and the mutational burden to be 16/Mb  (c) there are potentially actionable mutations in AKT3, TSC1, KRAS  MAPK1 and RB1   PLAN: Kassidee is now into her seventh month of antiestrogens and cyclin inhibitors.  There is no evidence of disease progression but also the right breast really looks unchanged from baseline.  I think we have to document this and I am getting a CT of the chest and MRI of the breast.  I am asking her surgeon Dr. Marlou Starks to review this with her.  If we could possibly do a modified radical mastectomy on the  right that would be terrific.  I would vote for it even if we needed some plastic support, so long as we could obtain a closure with good margins.  If not simply is not going to be possible then she will see me again in about 6 weeks and at that point we will consider intensification of treatment.  The fact that her functional status is borderline will compromise results  They know to call for any other issue that may develop before the next visit.  [name] [name], [name], MD  04/07/19 9:58 AM Medical Oncology and Hematology [facility] [address] [place], [place] [address] Tel. [phone]    Fax. [phone]    I, Wilburn Mylar, am acting as scribe for Dr. [name]. Efrem Pitstick.  I, Lurline Del MD, have reviewed the above documentation for accuracy and completeness, and I agree with the above.

## 2019-04-07 ENCOUNTER — Inpatient Hospital Stay: Payer: Medicare Other

## 2019-04-07 ENCOUNTER — Inpatient Hospital Stay: Payer: Medicare Other | Attending: Oncology | Admitting: Oncology

## 2019-04-07 ENCOUNTER — Other Ambulatory Visit: Payer: Self-pay

## 2019-04-07 VITALS — BP 147/52 | HR 105 | Temp 99.4°F | Resp 18 | Ht 66.0 in | Wt 185.8 lb

## 2019-04-07 DIAGNOSIS — C50811 Malignant neoplasm of overlapping sites of right female breast: Secondary | ICD-10-CM

## 2019-04-07 DIAGNOSIS — I63132 Cerebral infarction due to embolism of left carotid artery: Secondary | ICD-10-CM

## 2019-04-07 DIAGNOSIS — C50911 Malignant neoplasm of unspecified site of right female breast: Secondary | ICD-10-CM

## 2019-04-07 DIAGNOSIS — Z170 Estrogen receptor positive status [ER+]: Secondary | ICD-10-CM

## 2019-04-07 DIAGNOSIS — N951 Menopausal and female climacteric states: Secondary | ICD-10-CM

## 2019-04-07 DIAGNOSIS — I69359 Hemiplegia and hemiparesis following cerebral infarction affecting unspecified side: Secondary | ICD-10-CM

## 2019-04-07 LAB — CBC WITH DIFFERENTIAL/PLATELET
Abs Immature Granulocytes: 0.01 10*3/uL (ref 0.00–0.07)
Basophils Absolute: 0.1 10*3/uL (ref 0.0–0.1)
Basophils Relative: 1 %
Eosinophils Absolute: 0 10*3/uL (ref 0.0–0.5)
Eosinophils Relative: 1 %
HCT: 33.7 % — ABNORMAL LOW (ref 36.0–46.0)
Hemoglobin: 10.5 g/dL — ABNORMAL LOW (ref 12.0–15.0)
Immature Granulocytes: 0 %
Lymphocytes Relative: 28 %
Lymphs Abs: 1 10*3/uL (ref 0.7–4.0)
MCH: 27.1 pg (ref 26.0–34.0)
MCHC: 31.2 g/dL (ref 30.0–36.0)
MCV: 87.1 fL (ref 80.0–100.0)
Monocytes Absolute: 0.4 10*3/uL (ref 0.1–1.0)
Monocytes Relative: 11 %
Neutro Abs: 2.2 10*3/uL (ref 1.7–7.7)
Neutrophils Relative %: 59 %
Platelets: 151 10*3/uL (ref 150–400)
RBC: 3.87 MIL/uL (ref 3.87–5.11)
RDW: 18.5 % — ABNORMAL HIGH (ref 11.5–15.5)
WBC: 3.7 10*3/uL — ABNORMAL LOW (ref 4.0–10.5)
nRBC: 0 % (ref 0.0–0.2)

## 2019-04-07 LAB — COMPREHENSIVE METABOLIC PANEL
ALT: 14 U/L (ref 0–44)
AST: 12 U/L — ABNORMAL LOW (ref 15–41)
Albumin: 3.7 g/dL (ref 3.5–5.0)
Alkaline Phosphatase: 90 U/L (ref 38–126)
Anion gap: 9 (ref 5–15)
BUN: 14 mg/dL (ref 8–23)
CO2: 28 mmol/L (ref 22–32)
Calcium: 9.3 mg/dL (ref 8.9–10.3)
Chloride: 104 mmol/L (ref 98–111)
Creatinine, Ser: 1.13 mg/dL — ABNORMAL HIGH (ref 0.44–1.00)
GFR calc Af Amer: 57 mL/min — ABNORMAL LOW (ref >60)
GFR calc non Af Amer: 49 mL/min — ABNORMAL LOW (ref >60)
Glucose, Bld: 125 mg/dL — ABNORMAL HIGH (ref 70–99)
Potassium: 3.6 mmol/L (ref 3.5–5.1)
Sodium: 141 mmol/L (ref 135–145)
Total Bilirubin: 0.4 mg/dL (ref 0.3–1.2)
Total Protein: 7.7 g/dL (ref 6.5–8.1)

## 2019-04-08 ENCOUNTER — Telehealth: Payer: Self-pay | Admitting: Oncology

## 2019-04-08 NOTE — Telephone Encounter (Signed)
Called regarding schedule °

## 2019-04-12 MED FILL — AMLODIPINE BESYLATE 10 MG T: 10 | 90 days supply | Qty: 90 | Fill #0

## 2019-04-13 ENCOUNTER — Other Ambulatory Visit: Payer: Self-pay | Admitting: *Deleted

## 2019-04-13 DIAGNOSIS — Z170 Estrogen receptor positive status [ER+]: Secondary | ICD-10-CM

## 2019-04-13 DIAGNOSIS — C50911 Malignant neoplasm of unspecified site of right female breast: Secondary | ICD-10-CM

## 2019-04-13 DIAGNOSIS — C50811 Malignant neoplasm of overlapping sites of right female breast: Secondary | ICD-10-CM

## 2019-04-15 ENCOUNTER — Other Ambulatory Visit: Payer: Self-pay | Admitting: *Deleted

## 2019-04-20 ENCOUNTER — Telehealth: Payer: Self-pay | Admitting: *Deleted

## 2019-04-20 NOTE — Telephone Encounter (Signed)
Left message on daughter [name]'s voicemail that scheduling has been trying to call to get patient scheduled for her CT chest.

## 2019-04-21 MED FILL — ATORVASTATIN 80 MG TABLET: 80 | 90 days supply | Qty: 90 | Fill #2

## 2019-04-29 MED FILL — CLOPIDOGREL 75 MG TABLET: 75 | 90 days supply | Qty: 90 | Fill #0

## 2019-05-05 ENCOUNTER — Ambulatory Visit
Admission: RE | Admit: 2019-05-05 | Discharge: 2019-05-05 | Disposition: A | Discharge status: Home / Self Care | Payer: Medicare Other | Source: Ambulatory Visit | Attending: Oncology | Admitting: Oncology

## 2019-05-05 ENCOUNTER — Telehealth: Payer: Self-pay | Admitting: Pharmacist

## 2019-05-05 DIAGNOSIS — C50911 Malignant neoplasm of unspecified site of right female breast: Secondary | ICD-10-CM | POA: Diagnosis not present

## 2019-05-05 DIAGNOSIS — C50811 Malignant neoplasm of overlapping sites of right female breast: Secondary | ICD-10-CM

## 2019-05-05 MED ORDER — GADOBUTROL 1 MMOL/ML IV SOLN
8.0000 mL | Freq: Once | INTRAVENOUS | Status: AC | PRN
  Administered 2019-05-05: 8 mL via INTRAVENOUS

## 2019-05-05 NOTE — Telephone Encounter (Signed)
Oral Chemotherapy Pharmacist Encounter   Spoke with patient's daughter, [name], today to follow up regarding patient's oral chemotherapy medication: palbociclib (Ibrance) for the treatment of HR+/ HER2- breast cancer in conjunction with anastrazole, planned duration until disease progression or unacceptable toxicicty.  Original Start date of oral chemotherapy: 01/19/2019  Pt reports 0 tablets/doses of Ibrance 125mg capsules, 1 capsule by mouth once daily with anastrazole for 3 weeks on, 1 week off, missed in the last month.   Patient has just received 1st fill of Ibrance 100mg in the tablet formulation and will take this 3 weeks on, 1 week off, repeated every 4 weeks  Pt reports the following side effects: increase in tiredness which may be improved with new decreased dose [name] informed of Dr. Magrinat's plan per 02/11/19 note to decrease dose in an effort to support ANC and reduced unnecessary office visits to check blood counts during COVID-19 pandemic  Pertinent labs reviewed: OK for continued treatment.  [name] states they have no issue receiving her mom's medicine from [employer]'s patient assistance program. I confirmed office visits scheduled for 05/19/19.  All questions answered. They know to call the office with questions or concerns.  Jesse , PharmD, BCPS, BCOP  05/05/2019 2:44 PM Oral Oncology Clinic [phone] 

## 2019-05-07 ENCOUNTER — Other Ambulatory Visit: Payer: Self-pay

## 2019-05-07 ENCOUNTER — Encounter: Payer: Self-pay | Admitting: Podiatry

## 2019-05-07 ENCOUNTER — Ambulatory Visit (INDEPENDENT_AMBULATORY_CARE_PROVIDER_SITE_OTHER): Payer: Medicare Other | Admitting: Podiatry

## 2019-05-07 DIAGNOSIS — D689 Coagulation defect, unspecified: Secondary | ICD-10-CM | POA: Insufficient documentation

## 2019-05-07 DIAGNOSIS — I63132 Cerebral infarction due to embolism of left carotid artery: Secondary | ICD-10-CM | POA: Diagnosis not present

## 2019-05-07 DIAGNOSIS — M79674 Pain in right toe(s): Secondary | ICD-10-CM | POA: Diagnosis not present

## 2019-05-07 DIAGNOSIS — L84 Corns and callosities: Secondary | ICD-10-CM | POA: Insufficient documentation

## 2019-05-07 DIAGNOSIS — M79675 Pain in left toe(s): Secondary | ICD-10-CM | POA: Diagnosis not present

## 2019-05-07 DIAGNOSIS — B351 Tinea unguium: Secondary | ICD-10-CM

## 2019-05-07 MED FILL — ANASTROZOLE 1 MG TABLET: 1 | 90 days supply | Qty: 90 | Fill #1

## 2019-05-07 NOTE — Progress Notes (Signed)
Complaint:  Visit Type: Patient presents  to my office for  preventative foot care services. Complaint: Patient states" my nails have grown long and thick and become painful to walk and wear shoes" Patient has been taking plavix.  Patient also has painful corn fifth toe right foot. The patient presents for preventative foot care services. No changes to ROS  Podiatric Exam: Vascular: dorsalis pedis and posterior tibial pulses are weakly  palpable right foot.  Dorsalis pedis and posterior tibial pulses are absent left foot.. Capillary return is immediate. Temperature gradient is WNL. Skin turgor WNL  Sensorium: Normal Semmes Weinstein monofilament test. Normal tactile sensation bilaterally. Nail Exam: Pt has thick disfigured discolored nails with subungual debris noted bilateral entire nail hallux through fifth toenails Ulcer Exam: There is no evidence of ulcer or pre-ulcerative changes or infection. Orthopedic Exam: Muscle tone and strength are WNL. No limitations in general ROM. No crepitus or effusions noted. Foot type and digits show no abnormalities. Bony prominences are unremarkable. Skin: No Porokeratosis. No infection or ulcers.  Heloma durum fifth toe right foot.  Diagnosis:  Onychomycosis, , Pain in right toe, pain in left toes,  HD 5th right  Treatment & Plan Procedures and Treatment: Consent by patient was obtained for treatment procedures.   Debridement of mycotic and hypertrophic toenails, 1 through 5 bilateral and clearing of subungual debris. No ulceration, no infection noted. Debride HD. Return Visit-Office Procedure: Patient instructed to return to the office for a follow up visit 3 months for continued evaluation and treatment.    Gardiner Barefoot DPM

## 2019-05-10 ENCOUNTER — Ambulatory Visit (HOSPITAL_COMMUNITY)
Admission: RE | Admit: 2019-05-10 | Discharge: 2019-05-10 | Disposition: A | Discharge status: Home / Self Care | Payer: Medicare Other | Source: Ambulatory Visit | Attending: Oncology | Admitting: Oncology

## 2019-05-10 ENCOUNTER — Other Ambulatory Visit: Payer: Self-pay

## 2019-05-10 ENCOUNTER — Encounter (HOSPITAL_COMMUNITY): Payer: Self-pay

## 2019-05-10 DIAGNOSIS — C50811 Malignant neoplasm of overlapping sites of right female breast: Secondary | ICD-10-CM | POA: Diagnosis not present

## 2019-05-10 DIAGNOSIS — J479 Bronchiectasis, uncomplicated: Secondary | ICD-10-CM | POA: Diagnosis not present

## 2019-05-10 DIAGNOSIS — C50911 Malignant neoplasm of unspecified site of right female breast: Secondary | ICD-10-CM | POA: Insufficient documentation

## 2019-05-10 DIAGNOSIS — Z170 Estrogen receptor positive status [ER+]: Secondary | ICD-10-CM | POA: Insufficient documentation

## 2019-05-10 DIAGNOSIS — R911 Solitary pulmonary nodule: Secondary | ICD-10-CM | POA: Diagnosis not present

## 2019-05-10 MED ORDER — IOHEXOL 300 MG/ML  SOLN
75.0000 mL | Freq: Once | INTRAMUSCULAR | Status: AC | PRN
  Administered 2019-05-10: 75 mL via INTRAVENOUS

## 2019-05-10 MED ORDER — SODIUM CHLORIDE (PF) 0.9 % IJ SOLN
INTRAMUSCULAR | Status: AC
  Filled 2019-05-10: qty 50

## 2019-05-11 ENCOUNTER — Other Ambulatory Visit: Payer: Self-pay | Admitting: Oncology

## 2019-05-11 DIAGNOSIS — C50811 Malignant neoplasm of overlapping sites of right female breast: Secondary | ICD-10-CM | POA: Diagnosis not present

## 2019-05-11 DIAGNOSIS — Z170 Estrogen receptor positive status [ER+]: Secondary | ICD-10-CM | POA: Diagnosis not present

## 2019-05-12 ENCOUNTER — Other Ambulatory Visit: Payer: Self-pay | Admitting: Oncology

## 2019-05-12 DIAGNOSIS — Z170 Estrogen receptor positive status [ER+]: Secondary | ICD-10-CM

## 2019-05-12 DIAGNOSIS — C50911 Malignant neoplasm of unspecified site of right female breast: Secondary | ICD-10-CM

## 2019-05-12 DIAGNOSIS — C50811 Malignant neoplasm of overlapping sites of right female breast: Secondary | ICD-10-CM

## 2019-05-12 DIAGNOSIS — R29898 Other symptoms and signs involving the musculoskeletal system: Secondary | ICD-10-CM

## 2019-05-12 DIAGNOSIS — Z7189 Other specified counseling: Secondary | ICD-10-CM | POA: Insufficient documentation

## 2019-05-12 NOTE — Progress Notes (Signed)
Spoke with Dr. Marlou Starks today regarding the recent MRI.  Unfortunately with extensive involvement of the pectoralis major surgical options are either to do a modified radical which would leave tumor behind, or a radical mastectomy which of course has its own set of disabilities associated with it.  Either 1 of those procedures is going to require significant plastic involvement.  I think step #1 is to make sure that we are not dealing with metastatic disease.  Chest CT and prior bone scan have been negative but I think we need to obtain a PET scan.  If we do have metastatic disease, which would make this incurable, then we would go for control rather than curative would make a big difference surgically.  I will try to obtain pembrolizumab for her on the basis of her elevated mutational status.  We can also consider different chemotherapy to see if we can possibly move the tumor off the pectoralis.  I will discuss this with her when she returns to see me next week.

## 2019-05-18 MED FILL — LISINOPRIL-HCTZ 20-12.5 MG: 20-12.5 | 90 days supply | Qty: 90 | Fill #0

## 2019-05-19 ENCOUNTER — Inpatient Hospital Stay: Payer: Medicare Other | Attending: Oncology | Admitting: Oncology

## 2019-05-19 ENCOUNTER — Other Ambulatory Visit: Payer: Self-pay

## 2019-05-19 ENCOUNTER — Inpatient Hospital Stay: Payer: Medicare Other

## 2019-05-19 VITALS — BP 136/56 | HR 99 | Temp 98.9°F | Resp 18 | Ht 66.0 in | Wt 189.8 lb

## 2019-05-19 DIAGNOSIS — I69359 Hemiplegia and hemiparesis following cerebral infarction affecting unspecified side: Secondary | ICD-10-CM

## 2019-05-19 DIAGNOSIS — Z170 Estrogen receptor positive status [ER+]: Secondary | ICD-10-CM | POA: Insufficient documentation

## 2019-05-19 DIAGNOSIS — C50911 Malignant neoplasm of unspecified site of right female breast: Secondary | ICD-10-CM

## 2019-05-19 DIAGNOSIS — C50811 Malignant neoplasm of overlapping sites of right female breast: Secondary | ICD-10-CM

## 2019-05-19 DIAGNOSIS — I63132 Cerebral infarction due to embolism of left carotid artery: Secondary | ICD-10-CM

## 2019-05-19 DIAGNOSIS — N951 Menopausal and female climacteric states: Secondary | ICD-10-CM | POA: Insufficient documentation

## 2019-05-19 DIAGNOSIS — R5383 Other fatigue: Secondary | ICD-10-CM | POA: Insufficient documentation

## 2019-05-19 LAB — CBC WITH DIFFERENTIAL/PLATELET
Abs Immature Granulocytes: 0.02 10*3/uL (ref 0.00–0.07)
Basophils Absolute: 0.1 10*3/uL (ref 0.0–0.1)
Basophils Relative: 3 %
Eosinophils Absolute: 0.1 10*3/uL (ref 0.0–0.5)
Eosinophils Relative: 1 %
HCT: 33.8 % — ABNORMAL LOW (ref 36.0–46.0)
Hemoglobin: 10.8 g/dL — ABNORMAL LOW (ref 12.0–15.0)
Immature Granulocytes: 1 %
Lymphocytes Relative: 29 %
Lymphs Abs: 1.2 10*3/uL (ref 0.7–4.0)
MCH: 29.5 pg (ref 26.0–34.0)
MCHC: 32 g/dL (ref 30.0–36.0)
MCV: 92.3 fL (ref 80.0–100.0)
Monocytes Absolute: 0.4 10*3/uL (ref 0.1–1.0)
Monocytes Relative: 10 %
Neutro Abs: 2.4 10*3/uL (ref 1.7–7.7)
Neutrophils Relative %: 56 %
Platelets: 236 10*3/uL (ref 150–400)
RBC: 3.66 MIL/uL — ABNORMAL LOW (ref 3.87–5.11)
RDW: 17.3 % — ABNORMAL HIGH (ref 11.5–15.5)
WBC: 4.2 10*3/uL (ref 4.0–10.5)
nRBC: 0 % (ref 0.0–0.2)

## 2019-05-19 LAB — COMPREHENSIVE METABOLIC PANEL
ALT: 13 U/L (ref 0–44)
AST: 14 U/L — ABNORMAL LOW (ref 15–41)
Albumin: 3.8 g/dL (ref 3.5–5.0)
Alkaline Phosphatase: 87 U/L (ref 38–126)
Anion gap: 8 (ref 5–15)
BUN: 17 mg/dL (ref 8–23)
CO2: 28 mmol/L (ref 22–32)
Calcium: 9.1 mg/dL (ref 8.9–10.3)
Chloride: 105 mmol/L (ref 98–111)
Creatinine, Ser: 1.26 mg/dL — ABNORMAL HIGH (ref 0.44–1.00)
GFR calc Af Amer: 50 mL/min — ABNORMAL LOW (ref >60)
GFR calc non Af Amer: 43 mL/min — ABNORMAL LOW (ref >60)
Glucose, Bld: 122 mg/dL — ABNORMAL HIGH (ref 70–99)
Potassium: 3.8 mmol/L (ref 3.5–5.1)
Sodium: 141 mmol/L (ref 135–145)
Total Bilirubin: 0.3 mg/dL (ref 0.3–1.2)
Total Protein: 7.7 g/dL (ref 6.5–8.1)

## 2019-05-19 NOTE — Progress Notes (Signed)
[facility]  [phone] [phone]    ID: [name] [name] [name] DOB: 08-11-1946  MR#: [id number]  EFE#:[id number]  Patient Care Team: [name] Carol, MD as PCP - General (Internal Medicine) [name] Sandy, MD as Consulting Physician (Vascular Surgery) [name] Berthold, NP as Nurse Practitioner (Cardiology) [name] Staggers, MD as Consulting Physician (Physical Medicine and Rehabilitation) [name] [name], [name] Leyden, NP as Nurse Practitioner (Vascular Surgery) [name] Roys, PsyD as Consulting Physician (Psychology) [name] [name] [name], [name] Dad, MD as Consulting Physician (Oncology) [name] Kussmaul, MD as Consulting Physician (General Surgery) [name] Gibson, MD as Attending Physician (Radiation Oncology) OTHER MD:   CHIEF COMPLAINT: Inflammatory breast cancer  CURRENT TREATMENT: Neoadjuvant anastrozole and palbociclib   INTERVAL HISTORY: [name] [name] [name] returns today for follow-up and treatment of her locally advanced breast cancer. She is accompanied by her daughter via face time.  She continues on anastrozole, with good tolerance. She reports occasional very mild hot flashes and denies vaginal dryness.  She also continues on Ibrance with good tolerance. She just began a new cycle. She reports some fatigue and denies nausea.   Since her last visit, she underwent bilateral breast MRI at [facility] on 05/05/2019 showing: breast density category C; slight interval improvement along the medial aspect in the extensive malignancy involving most of the right breast, invading the skin and underlying pectoralis muscle; no evidence of new right or left breast malignancy; no abnormal lymph nodes.  She also underwent chest CT on 05/10/2019, which showed: stable exam, unchanged appearance of infiltrating tumor throughout the right breast; no evidence of thoracic adenopathy or other signs of metastatic disease.   REVIEW OF SYSTEMS: [name] [name] [name] reports she doesn't do much of  anything. Her daughter notes [name] [name] [name] does not have the energy to do much. She stays at home if at all possible; her daughter and grandson are currently living with her. Her daughter does the shopping for them. Her daughter is working from home, and her grandson works outside the home. A detailed review of systems was otherwise entirely negative.   HISTORY OF CURRENT ILLNESS: From the original intake note:  [name] [name] [name] presented with right breast discoloration and redness. The right breast also had an area of hardness and nipple retraction.  She did not bring this to medical attention but her daughter did mention it when the patient had neurologic follow-up on 08/10/2018.  At that time the nurse practitioner, [name] [name] [name], noted right nipple inversion, hard breast, and red non-ulcerated lesions on the breast.  The patient then underwent bilateral diagnostic mammography with tomography and right breast ultrasonography at [facility] on 08/11/2018 (this is her first ever mammogram) showing: breast density category B. There was a round 2.5 cm mass at the 1 o'clock upper inner quadrant of the right breast.  By palpation this measured approximately 10 cm.  Sonographically the irregular hypoechoic mass measured 2.7 cm. There was also a hypoechoic mass in the right breast at 9 o'clock upper outer quadrant measuring 3.8 cm. There was an abnormal right axillary lymph node with cortical thickening.   Accordingly on 08/19/2018 she proceeded to biopsy of the right breast areas in question. The pathology from this procedure showed [phone]): At the 1 o'clock: invasive lobular carcinoma, grade II. Prognostic indicators significant for: estrogen receptor, 60% positive with moderate staining intensity and progesterone receptor, 0% negative. Proliferation marker Ki67 at 20%. HER2 negative with an immunohistochemistry of (1+).  At the 9 o'clock: invasive lobular carcinoma, grade II. Prognostic indicators significant for:  estrogen receptor, 90% positive with strong staining intensity and progesterone receptor, 0% negative. Proliferation marker Ki67 at 30%. HER2 negative with an immunohistochemistry of (1+).  The patient's subsequent history is as detailed below.   PAST MEDICAL HISTORY: Past Medical History:  Diagnosis Date   Anxiety    Cancer [facility])    breast cancer- right   Carotid stenosis    Depression    situational   Dyspnea    with much ambulation   GERD (gastroesophageal reflux disease)    Hypertension    Stroke ([place]) 04/01/2017   a little weakness right side leg and hand, a little memry loss    PAST SURGICAL HISTORY: Past Surgical History:  Procedure Laterality Date   ABDOMINAL HYSTERECTOMY     ENDARTERECTOMY Left 04/08/2017   Procedure: ENDARTERECTOMY CAROTID;  Surgeon: [name] Sandy, MD;  Location: [facility];  Service: Vascular;  Laterality: Left;   PATCH ANGIOPLASTY Left 04/08/2017   Procedure: PATCH ANGIOPLASTY Left Carotid;  Surgeon: [name] Sandy, MD;  Location: [facility];  Service: Vascular;  Laterality: Left;   PORTACATH PLACEMENT Left 09/07/2018   Procedure: INSERTION PORT-A-CATH LEFT INTERNAL JUGULAR;  Surgeon: [name] Kussmaul, MD;  Location: [place];  Service: General;  Laterality: Left;  total hysterectomy with bilateral salpingo-oophorectomy in her 73's no hormone replacement   FAMILY HISTORY: Family History  Problem Relation Age of Onset   Cancer Mother        ovarian   The patient's father is alive at age 70. The patient's mother died at age 34 due to ovarian cancer. The patient had 3 brothers and 3 sisters. She denies a history of breast, pancreatic, colon, or other cancers in the family.    GYNECOLOGIC HISTORY:  No LMP recorded. Patient has had a hysterectomy. Menarche: Age at first live birth: 73 years old She is GX P1.  She is status post total hysterectomy with BSO in her early 20's. She did not use HRT.    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. She is divorced. At home is just herself, but her two grandsons regularly come to visit and stay with her. The patient's daughter, [name] [name] [name] works in administration for [employer]. The patient's 18 y.o. grandson is a Freight forwarder for [employer]. The patient's 103 y.o. grandson works in [employer].    ADVANCED DIRECTIVES: Not in place.  At the 08/26/2018 visit the patient was given the appropriate documents to complete and notarized at her discretion.   HEALTH MAINTENANCE: Social History   Tobacco Use   Smoking status: Never Smoker   Smokeless tobacco: Never Used  Substance Use Topics   Alcohol use: No   Drug use: No     Colonoscopy: Never  PAP: Status post hysterectomy  Bone density: Never   No Known Allergies  Current Outpatient Medications  Medication Sig Dispense Refill   amLODipine (NORVASC) 10 MG tablet Take 1 tablet (10 mg total) by mouth daily. PATIENT MUST HAVE OFFICE VISIT AND LABS PRIOR TO ANY FURTHER REFILLS 15 tablet 0   anastrozole (ARIMIDEX) 1 MG tablet Take 1 tablet (1 mg total) by mouth daily. 90 tablet 4   atorvastatin (LIPITOR) 80 MG tablet Take 1 tablet (80 mg total) by mouth daily at 6 PM. 30 tablet 0   clopidogrel (PLAVIX) 75 MG tablet TAKE 1 TABLET BY MOUTH EVERY DAY 30 tablet 1   lisinopril-hydrochlorothiazide (PRINZIDE,ZESTORETIC) 20-12.5 MG tablet Take 1 tablet by mouth daily.  11   loratadine (CLARITIN) 10 MG tablet Take 10 mg by mouth daily as needed for allergies.     palbociclib (IBRANCE) 100 MG capsule Take 1 capsule (100 mg total) by mouth daily with breakfast. Take whole with food. Take for 21 days on, 7 days off, repeat every 28 days. 21 capsule 3   potassium chloride SA (K-DUR,KLOR-CON) 20 MEQ tablet Take 1 tablet (20 mEq total) by mouth daily. 30 tablet 0   No current facility-administered medications for this visit.    Facility-Administered Medications Ordered in Other Visits  Medication  Dose Route Frequency Provider Last Rate Last Dose   heparin lock flush 100 unit/mL  500 Units Intracatheter Once PRN , [name] Dad, MD       sodium chloride flush (NS) 0.9 % injection 10 mL  10 mL Intracatheter PRN , [name] Dad, MD        OBJECTIVE: Older African-American woman who appears stated age  There were no vitals filed for this visit.   There is no height or weight on file to calculate BMI.   Wt Readings from Last 3 Encounters:  04/07/19 185 lb 12.8 oz (84.3 kg)  02/05/19 182 lb 9.6 oz (82.8 kg)  01/08/19 179 lb 12.8 oz (81.6 kg)      ECOG FS:2 - Symptomatic, <50% confined to bed  Sclerae unicteric, pupils round and equal Wearing a mask No cervical or supraclavicular adenopathy Lungs no rales or rhonchi Heart regular rate and rhythm Abd soft, nontender, positive bowel sounds MSK no focal spinal tenderness, no upper extremity lymphedema Neuro: nonfocal, well oriented, appropriate affect Breasts: The right breast is not obviously changed from the image below.  Again I do not palpate any adenopathy in the right axilla.  The left breast and left axilla are unremarkable.  Right Breast 04/07/2019     Right Breast 01/08/2019    Right Breast 08/26/2018    LAB RESULTS:  CMP     Component Value Date/Time   NA 141 04/07/2019 0910   K 3.6 04/07/2019 0910   CL 104 04/07/2019 0910   CO2 28 04/07/2019 0910   GLUCOSE 125 (H) 04/07/2019 0910   BUN 14 04/07/2019 0910   CREATININE 1.13 (H) 04/07/2019 0910   CREATININE 1.11 (H) 11/03/2018 1017   CALCIUM 9.3 04/07/2019 0910   PROT 7.7 04/07/2019 0910   ALBUMIN 3.7 04/07/2019 0910   AST 12 (L) 04/07/2019 0910   AST 32 11/03/2018 1017   ALT 14 04/07/2019 0910   ALT 48 (H) 11/03/2018 1017   ALKPHOS 90 04/07/2019 0910   BILITOT 0.4 04/07/2019 0910   BILITOT 0.5 11/03/2018 1017   GFRNONAA 49 (L) 04/07/2019 0910   GFRNONAA 50 (L) 11/03/2018 1017   GFRAA 57 (L) 04/07/2019 0910   GFRAA 58 (L) 11/03/2018 1017      No results found for: TOTALPROTELP, ALBUMINELP, A1GS, A2GS, BETS, BETA2SER, GAMS, MSPIKE, SPEI  No results found for: KPAFRELGTCHN, LAMBDASER, KAPLAMBRATIO  Lab Results  Component Value Date   WBC 3.7 (L) 04/07/2019   NEUTROABS 2.2 04/07/2019   HGB 10.5 (L) 04/07/2019   HCT 33.7 (L) 04/07/2019   MCV 87.1 04/07/2019   PLT 151 04/07/2019    _0 @  No results found for: LABCA2  No components found for: PQAESL753  No results for input(s): INR in the last 168 hours.  No results found for: LABCA2  No results found for: YYF110  No results found for: YTR173  No results found for: VAP014  Lab  Results  Component Value Date   CA2729 54.4 (H) 09/15/2018    No components found for: HGQUANT  No results found for: CEA1 / No results found for: CEA1   No results found for: AFPTUMOR  No results found for: CHROMOGRNA  No results found for: PSA1  No visits with results within 3 Day(s) from this visit.  Latest known visit with results is:  Appointment on 04/07/2019  Component Date Value Ref Range Status   Sodium 04/07/2019 141  135 - 145 mmol/L Final   Potassium 04/07/2019 3.6  3.5 - 5.1 mmol/L Final   Chloride 04/07/2019 104  98 - 111 mmol/L Final   CO2 04/07/2019 28  22 - 32 mmol/L Final   Glucose, Bld 04/07/2019 125* 70 - 99 mg/dL Final   BUN 04/07/2019 14  8 - 23 mg/dL Final   Creatinine, Ser 04/07/2019 1.13* 0.44 - 1.00 mg/dL Final   Calcium 04/07/2019 9.3  8.9 - 10.3 mg/dL Final   Total Protein 04/07/2019 7.7  6.5 - 8.1 g/dL Final   Albumin 04/07/2019 3.7  3.5 - 5.0 g/dL Final   AST 04/07/2019 12* 15 - 41 U/L Final   ALT 04/07/2019 14  0 - 44 U/L Final   Alkaline Phosphatase 04/07/2019 90  38 - 126 U/L Final   Total Bilirubin 04/07/2019 0.4  0.3 - 1.2 mg/dL Final   GFR calc non Af Amer 04/07/2019 49* >60 mL/min Final   GFR calc Af Amer 04/07/2019 57* >60 mL/min Final   Anion gap 04/07/2019 9  5 - 15 Final   Performed at [facility], [place] [address], [place], [place] [address]   WBC 04/07/2019 3.7* 4.0 - 10.5 K/uL Final   RBC 04/07/2019 3.87  3.87 - 5.11 MIL/uL Final   Hemoglobin 04/07/2019 10.5* 12.0 - 15.0 g/dL Final   HCT 04/07/2019 33.7* 36.0 - 46.0 % Final   MCV 04/07/2019 87.1  80.0 - 100.0 fL Final   MCH 04/07/2019 27.1  26.0 - 34.0 pg Final   MCHC 04/07/2019 31.2  30.0 - 36.0 g/dL Final   RDW 04/07/2019 18.5* 11.5 - 15.5 % Final   Platelets 04/07/2019 151  150 - 400 K/uL Final   nRBC 04/07/2019 0.0  0.0 - 0.2 % Final   Neutrophils Relative % 04/07/2019 59  % Final   Neutro Abs 04/07/2019 2.2  1.7 - 7.7 K/uL Final   Lymphocytes Relative 04/07/2019 28  % Final   Lymphs Abs 04/07/2019 1.0  0.7 - 4.0 K/uL Final   Monocytes Relative 04/07/2019 11  % Final   Monocytes Absolute 04/07/2019 0.4  0.1 - 1.0 K/uL Final   Eosinophils Relative 04/07/2019 1  % Final   Eosinophils Absolute 04/07/2019 0.0  0.0 - 0.5 K/uL Final   Basophils Relative 04/07/2019 1  % Final   Basophils Absolute 04/07/2019 0.1  0.0 - 0.1 K/uL Final   Immature Granulocytes 04/07/2019 0  % Final   Abs Immature Granulocytes 04/07/2019 0.01  0.00 - 0.07 K/uL Final   Acanthocytes 04/07/2019 PRESENT   Final   Ovalocytes 04/07/2019 PRESENT   Final   Performed at [facility], [place] [address] [place], [place] [address]    (this displays the last labs from the last 3 days)  No results found for: TOTALPROTELP, ALBUMINELP, A1GS, A2GS, BETS, BETA2SER, GAMS, MSPIKE, SPEI (this displays SPEP labs)  No results found for: KPAFRELGTCHN, LAMBDASER, KAPLAMBRATIO (kappa/lambda light chains)  No results found for: HGBA,  HGBA2QUANT, HGBFQUANT, HGBSQUAN (Hemoglobinopathy evaluation)   No results found for: LDH  No results found for: IRON, TIBC, IRONPCTSAT (Iron and TIBC)  No results found for: FERRITIN  Urinalysis    Component Value Date/Time   COLORURINE AMBER (A) 04/01/2017  0920   APPEARANCEUR CLOUDY (A) 04/01/2017 0920   LABSPEC 1.021 04/01/2017 0920   PHURINE 5.0 04/01/2017 0920   GLUCOSEU NEGATIVE 04/01/2017 0920   HGBUR MODERATE (A) 04/01/2017 0920   BILIRUBINUR NEGATIVE 04/01/2017 0920   KETONESUR 80 (A) 04/01/2017 0920   PROTEINUR NEGATIVE 04/01/2017 0920   NITRITE NEGATIVE 04/01/2017 0920   LEUKOCYTESUR LARGE (A) 04/01/2017 0920     STUDIES: Ct Chest W Contrast  Result Date: 05/10/2019 CLINICAL DATA:  Right breast cancer.  Status post chemotherapy. EXAM: CT CHEST WITH CONTRAST TECHNIQUE: Multidetector CT imaging of the chest was performed during intravenous contrast administration. CONTRAST:  74m OMNIPAQUE IOHEXOL 300 MG/ML  SOLN COMPARISON:  01/11/2019 FINDINGS: Cardiovascular: The heart size appears within normal limits. Aortic atherosclerosis. Coronary LAD, left circumflex and RCA coronary artery calcifications. Mediastinum/Nodes: Normal appearance of the thyroid gland. The trachea appears patent and is midline. Normal appearance of the esophagus. No supraclavicular, or axillary adenopathy. No mediastinal or hilar adenopathy. Lungs/Pleura: No pleural effusion identified. No airspace consolidation or pneumothorax. Scarring identified within both lung bases. Cylindrical bronchiectasis noted within the lung bases. Ground-glass attenuating nodule within the left upper lobe measures 5 mm and is unchanged, image 29/7. Tiny left upper lobe lung nodule measuring 4 mm is also unchanged, image 56/7. Upper Abdomen: Nonobstructing right renal calculus. No acute abnormality. Musculoskeletal: Thoracic spondylosis. No aggressive lytic or sclerotic bone lesion. Again noted is asymmetric nodular soft tissue thickening with overlying skin thickening extending to the superficial surface of the pectoralis musculature. The appearance is similar to the previous exam. IMPRESSION: 1. Stable exam. Unchanged appearance of infiltrating tumor throughout the right breast. This is better  evaluated by recent breast MRI from 05/05/2019. 2. No evidence for thoracic adenopathy or other signs of metastatic disease. 3. Aortic Atherosclerosis (ICD10-I70.0). Coronary artery calcifications Electronically Signed   By: TKerby MoorsM.D.   On: 05/10/2019 10:41   Mr Breast Bilateral W Wo Contrast Inc Cad  Result Date: 05/06/2019 CLINICAL DATA:  Patient diagnosed with right-sided breast carcinoma in September 2019. Patient has undergone neoadjuvant chemotherapy. Current exam is for restaging. Patient has no current breast symptoms. LABS:  No labs drawn at time of imaging. EXAM: BILATERAL BREAST MRI WITH AND WITHOUT CONTRAST TECHNIQUE: Multiplanar, multisequence MR images of both breasts were obtained prior to and following the intravenous administration of 8 ml of Gadavist Three-dimensional MR images were rendered by post-processing of the original MR data on an independent workstation. The three-dimensional MR images were interpreted, and findings are reported in the following complete MRI report for this study. Three dimensional images were evaluated at the independent DynaCad workstation COMPARISON:  Previous exams including prior breast MRIs from 01/03/2019 and 09/13/2018. FINDINGS: Breast composition: c. Heterogeneous fibroglandular tissue. Background parenchymal enhancement: Mild. Right breast: There has been slight improvement when compared to the exam dated 09/13/2018. Specifically, there is less abnormal enhancement along the medial aspect of the right breast. However, there is still extensive abnormal mass and non mass enhancement, which extends throughout most of the right breast, spanning approximately 10 x 12 x 11 cm, involving the skin in multiple locations, most evident along the upper outer quadrant, and extending posteriorly to broadly involve the pectoralis major muscle. The skin remains thickened,  but unchanged from the prior studies. Susceptibility artifact from post biopsy clips is  again noted. There are no new areas of right breast malignancy. Left breast: No mass or abnormal enhancement. Lymph nodes: No abnormal appearing lymph nodes. Ancillary findings: Right sided skin thickening and right pectoralis muscle tumor invasion as detailed above. IMPRESSION: 1. Slight interval improvement in the extensive malignancy involving most of the right breast, invading the skin and invading the underlying pectoralis muscle. The improvement is along the medial aspect of the breast, where there is now less mass and non mass enhancement, best seen comparing to the initial exam from 09/13/2018. 2. No evidence of new right breast malignancy. 3. No abnormal lymph nodes. 4. No evidence of left breast malignancy. RECOMMENDATION: Treatment as planned for the known, extensive right breast malignancy. BI-RADS CATEGORY  6: Known biopsy-proven malignancy. Electronically Signed   By: Lajean Manes M.D.   On: 05/06/2019 09:23    ELIGIBLE FOR AVAILABLE RESEARCH PROTOCOL: no   ASSESSMENT: 73 y.o. [place], [place] woman status post right breast biopsy of overlapping sites on 08/19/2018 for a clinical T4 N1, stage IIIB invasive lobular carcinoma, estrogen receptor positive, progesterone receptor negative, HER-2 not amplified, with an MIB-1 of 20%.  (a) CT chest/abd/pelvis 09/02/2018 and bone scan 09/10/2018 show no evidence of stage IV disease  (b) breast MRI 09/14/2018 confirms a multicentric 12.6 cm right breast mass infiltrating pectpralis but w/o associated adenopathy  (1) genetics testing to be scheduled  (2) neoadjuvant chemotherapy consisting of cyclophosphamide, methotrexate and fluorouracil every 21 days x 8, started 09/16/2018, last dose 12/18/2018  (a) CMF chemotherapy stopped after 4 cycles, with no evidence of response by MRI NOV 2019  (5) anastrozole started February 2019  (a) bone density to be obtained  (b) palbociclib added 01/19/2019 at 125 mg daily, 21/7  (c) palbociclib dose decreased to  100 mg daily 21 on 7 off, starting with second cycle, April 2020  (d) breast MRI 05/05/2019 shows slight improvement, no evidence of progression, however the tumor is not yet resectable.  Chest CT scan same day shows no evidence of metastatic disease  (6) foundation 1/PD-L1 testing:  (a) PD-L1 requested 12/18/2018--insufficient tissue  (b) foundation one testing shows the microsatellite status to be stable, and the mutational burden to be 16/Mb  (c) there are potentially actionable mutations in AKT3, TSC1, KRAS  MAPK1 and RB1  (3) definitive surgery pending  (4) adjuvant radiation to   PLAN: Vyolet is tolerating the anastrozole and palbociclib well.  Her labs today are very reassuring.  From that point of view we are certainly continuing this treatment.  We are going to see her again in 3 months and 6 months and before the 43-monthvisits she will have a repeat MRI.  I have alerted her and her daughter to let u[place]oreaknow if it seems that things are getting worse instead of better.  In that case we would see them earlier.  I am hopeful even if the response is slow that we can continue to make improvement and so that sometime possibly next year she might be able to undergo surgery.  That is not a possibility at present  They are taking appropriate pandemic precautions.  They know to call for any other issue that may develop before the next visit.   Solace Manwarren, GVirgie Dad MD  05/19/19 11:34 AM Medical Oncology and Hematology [facility] [place] [address]Tel. [phone]   Fax. [phone]  I, Wilburn Mylar, am acting as scribe for Dr. Sarajane Jews C. .  I, Lurline Del MD, have reviewed the above documentation for accuracy and completeness, and I agree with the above.

## 2019-05-20 ENCOUNTER — Other Ambulatory Visit: Payer: Self-pay | Admitting: Oncology

## 2019-05-20 LAB — CANCER ANTIGEN 27.29: CA 27.29: 76.2 U/mL — ABNORMAL HIGH (ref 0.0–38.6)

## 2019-07-13 MED FILL — ATORVASTATIN 80 MG TABLET: 80 | 90 days supply | Qty: 90 | Fill #3

## 2019-07-13 MED FILL — AMLODIPINE BESYLATE 10 MG T: 10 | 90 days supply | Qty: 90 | Fill #1

## 2019-08-02 MED FILL — CLOPIDOGREL 75 MG TABLET: 75 | 90 days supply | Qty: 90 | Fill #1

## 2019-08-11 ENCOUNTER — Telehealth: Payer: Self-pay | Admitting: *Deleted

## 2019-08-11 NOTE — Progress Notes (Signed)
[facility]  [phone] [phone]    ID: Marcy Sookdeo DOB: [birth year]  MR#: [id number]  XBW#:[id number]  Patient Care Team: [name], MD as PCP - General (Internal Medicine) Waynetta Sandy, MD as Consulting Physician (Vascular Surgery) Patsey Berthold, NP as Nurse Practitioner (Cardiology) [name], MD as Consulting Physician (Physical Medicine and Rehabilitation) [name], [name], NP as Nurse Practitioner (Vascular Surgery) Edgardo Roys, PsyD as Consulting Physician (Psychology) Yakub Lodes, [name], MD as Consulting Physician (Oncology) [name], MD as Consulting Physician (General Surgery) Eppie Gibson, MD as Attending Physician (Radiation Oncology) OTHER MD:   CHIEF COMPLAINT: Inflammatory breast cancer  CURRENT TREATMENT: Neoadjuvant chemotherapy   INTERVAL HISTORY: Katilyn returns today regarding oozing sores on her right breast. Her daughter, [name], contacted our office yesterday, 08/11/2019, to request a visit.  Ica continues on anastrozole.  She denies problems with hot flashes except at bedtime.  Vaginal dryness is not an issue.  She also continues on Ibrance with good tolerance.  She is not aware of any symptoms related to this.   REVIEW OF SYSTEMS: Chonda tells me that there has been a change in her breast, but she denies pain, oozing, or bleeding.  She is not aware of any injury to the breast.  There is just a "spot" which looks dark to her.  She has not had intercurrent fever, cough, phlegm production, pleurisy, or any change in bowel or bladder habit.  A detailed review of systems today was otherwise stable.   HISTORY OF CURRENT ILLNESS: From the original intake note:  Rachana Malesky presented with right breast discoloration and redness. The right breast also had an area of hardness and nipple retraction.  She did not bring this to medical attention but her daughter did mention it when  the patient had neurologic follow-up on 08/10/2018.  At that time the nurse practitioner, [name], noted right nipple inversion, hard breast, and red non-ulcerated lesions on the breast.  The patient then underwent bilateral diagnostic mammography with tomography and right breast ultrasonography at [facility] on 08/11/2018 (this is her first ever mammogram) showing: breast density category B. There was a round 2.5 cm mass at the 1 o'clock upper inner quadrant of the right breast.  By palpation this measured approximately 10 cm.  Sonographically the irregular hypoechoic mass measured 2.7 cm. There was also a hypoechoic mass in the right breast at 9 o'clock upper outer quadrant measuring 3.8 cm. There was an abnormal right axillary lymph node with cortical thickening.   Accordingly on 08/19/2018 she proceeded to biopsy of the right breast areas in question. The pathology from this procedure showed [phone]): At the 1 o'clock: invasive lobular carcinoma, grade II. Prognostic indicators significant for: estrogen receptor, 60% positive with moderate staining intensity and progesterone receptor, 0% negative. Proliferation marker Ki67 at 20%. HER2 negative with an immunohistochemistry of (1+).  At the 9 o'clock: invasive lobular carcinoma, grade II. Prognostic indicators significant for: estrogen receptor, 90% positive with strong staining intensity and progesterone receptor, 0% negative. Proliferation marker Ki67 at 30%. HER2 negative with an immunohistochemistry of (1+).  The patient's subsequent history is as detailed below.   PAST MEDICAL HISTORY: Past Medical History:  Diagnosis Date   Anxiety    Cancer [facility])    breast cancer- right   Carotid stenosis    Depression    situational   Dyspnea    with much ambulation   GERD (gastroesophageal reflux disease)    Hypertension  Stroke ([place]) 04/01/2017   a little weakness right side leg and hand, a little memry loss    PAST SURGICAL  HISTORY: Past Surgical History:  Procedure Laterality Date   ABDOMINAL HYSTERECTOMY     ENDARTERECTOMY Left 04/08/2017   Procedure: ENDARTERECTOMY CAROTID;  Surgeon: Waynetta Sandy, MD;  Location: [place];  Service: Vascular;  Laterality: Left;   PATCH ANGIOPLASTY Left 04/08/2017   Procedure: PATCH ANGIOPLASTY Left Carotid;  Surgeon: Waynetta Sandy, MD;  Location: [place];  Service: Vascular;  Laterality: Left;   PORTACATH PLACEMENT Left 09/07/2018   Procedure: INSERTION PORT-A-CATH LEFT INTERNAL JUGULAR;  Surgeon: [name], MD;  Location: [place];  Service: General;  Laterality: Left;  total hysterectomy with bilateral salpingo-oophorectomy in her 73's no hormone replacement   FAMILY HISTORY: Family History  Problem Relation Age of Onset   Cancer Mother        ovarian   The patient's father is alive at age 32. The patient's mother died at age 18 due to ovarian cancer. The patient had 3 brothers and 3 sisters. She denies a history of breast, pancreatic, colon, or other cancers in the family.    GYNECOLOGIC HISTORY:  No LMP recorded. Patient has had a hysterectomy. Menarche: Age at first live birth: 73 years old She is GX P1.  She is status post total hysterectomy with BSO in her early 12's. She did not use HRT.    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. She is divorced. At home is just herself, but her two grandsons regularly come to visit and stay with her. The patient's daughter, [name] works in administration for [employer]. The patient's 36 y.o. grandson is a Freight forwarder for [employer]. The patient's 46 y.o. grandson works in [employer].    ADVANCED DIRECTIVES: Not in place.  At the 08/26/2018 visit the patient was given the appropriate documents to complete and notarized at her discretion.   HEALTH MAINTENANCE: Social History   Tobacco Use   Smoking status: Never Smoker   Smokeless tobacco: Never Used    Substance Use Topics   Alcohol use: No   Drug use: No     Colonoscopy: Never  PAP: Status post hysterectomy  Bone density: Never   No Known Allergies  Current Outpatient Medications  Medication Sig Dispense Refill   amLODipine (NORVASC) 10 MG tablet Take 1 tablet (10 mg total) by mouth daily. PATIENT MUST HAVE OFFICE VISIT AND LABS PRIOR TO ANY FURTHER REFILLS 15 tablet 0   atorvastatin (LIPITOR) 80 MG tablet Take 1 tablet (80 mg total) by mouth daily at 6 PM. 30 tablet 0   clopidogrel (PLAVIX) 75 MG tablet TAKE 1 TABLET BY MOUTH EVERY DAY 30 tablet 1   lisinopril-hydrochlorothiazide (PRINZIDE,ZESTORETIC) 20-12.5 MG tablet Take 1 tablet by mouth daily.  11   loratadine (CLARITIN) 10 MG tablet Take 10 mg by mouth daily as needed for allergies.     potassium chloride SA (K-DUR,KLOR-CON) 20 MEQ tablet Take 1 tablet (20 mEq total) by mouth daily. 30 tablet 0   No current facility-administered medications for this visit.    Facility-Administered Medications Ordered in Other Visits  Medication Dose Route Frequency Provider Last Rate Last Dose   heparin lock flush 100 unit/mL  500 Units Intracatheter Once PRN Mohab Ashby, [name], MD       sodium chloride flush (NS) 0.9 % injection 10 mL  10 mL Intracatheter PRN Zoraida Havrilla, [name], MD  OBJECTIVE: Older African-American woman in no acute distress  Vitals:   08/12/19 1601  BP: (!) 145/64  Pulse: (!) 110  Resp: 18  Temp: 99.1 F (37.3 C)  SpO2: 97%     Body mass index is 32.54 kg/m.   Wt Readings from Last 3 Encounters:  08/12/19 201 lb 9.6 oz (91.4 kg)  05/19/19 189 lb 12.8 oz (86.1 kg)  04/07/19 185 lb 12.8 oz (84.3 kg)      ECOG FS:2 - Symptomatic, <50% confined to bed  Sclerae unicteric, EOMs intact Wearing a mask No cervical or supraclavicular adenopathy Lungs no rales or rhonchi Heart regular rate and rhythm Abd soft, nontender, positive bowel sounds MSK no focal spinal tenderness, no upper extremity  lymphedema Neuro: nonfocal, well oriented, appropriate affect Breasts: The right breast is imaged below.  The erythematous portion appears a little bit more raised and there is an eschar in the upper lateral section of it.  While it is difficult to say with certainty that there has been progression I think it is fair to say there has been no significant response.  The left breast is entirely benign.  Right breast 08/12/2019    Right Breast 04/07/2019     Right Breast 01/08/2019    Right Breast 08/26/2018    LAB RESULTS:  CMP     Component Value Date/Time   NA 141 05/19/2019 1515   K 3.8 05/19/2019 1515   CL 105 05/19/2019 1515   CO2 28 05/19/2019 1515   GLUCOSE 122 (H) 05/19/2019 1515   BUN 17 05/19/2019 1515   CREATININE 1.26 (H) 05/19/2019 1515   CREATININE 1.11 (H) 11/03/2018 1017   CALCIUM 9.1 05/19/2019 1515   PROT 7.7 05/19/2019 1515   ALBUMIN 3.8 05/19/2019 1515   AST 14 (L) 05/19/2019 1515   AST 32 11/03/2018 1017   ALT 13 05/19/2019 1515   ALT 48 (H) 11/03/2018 1017   ALKPHOS 87 05/19/2019 1515   BILITOT 0.3 05/19/2019 1515   BILITOT 0.5 11/03/2018 1017   GFRNONAA 43 (L) 05/19/2019 1515   GFRNONAA 50 (L) 11/03/2018 1017   GFRAA 50 (L) 05/19/2019 1515   GFRAA 58 (L) 11/03/2018 1017    No results found for: TOTALPROTELP, ALBUMINELP, A1GS, A2GS, BETS, BETA2SER, GAMS, MSPIKE, SPEI  No results found for: KPAFRELGTCHN, LAMBDASER, KAPLAMBRATIO  Lab Results  Component Value Date   WBC 4.2 05/19/2019   NEUTROABS 2.4 05/19/2019   HGB 10.8 (L) 05/19/2019   HCT 33.8 (L) 05/19/2019   MCV 92.3 05/19/2019   PLT 236 05/19/2019    '@LASTCHEMISTRY'$ @  No results found for: LABCA2  No components found for: OZDGUY403  No results for input(s): INR in the last 168 hours.  No results found for: LABCA2  No results found for: KVQ259  No results found for: DGL875  No results found for: IEP329  Lab Results  Component Value Date   CA2729 76.2 (H) 05/19/2019      No components found for: HGQUANT  No results found for: CEA1 / No results found for: CEA1   No results found for: AFPTUMOR  No results found for: CHROMOGRNA  No results found for: PSA1  No visits with results within 3 Day(s) from this visit.  Latest known visit with results is:  Appointment on 05/19/2019  Component Date Value Ref Range Status   CA 27.29 05/19/2019 76.2* 0.0 - 38.6 U/mL Final   Comment: (NOTE) Siemens Centaur Immunochemiluminometric Methodology (ICMA) Values obtained with different assay methods or kits  cannot be used interchangeably. Results cannot be interpreted as absolute evidence of the presence or absence of malignant disease. Performed At: [facility] [place], [place] [id number] Rush Farmer MD LK:[id number]    Sodium 05/19/2019 141  135 - 145 mmol/L Final   Potassium 05/19/2019 3.8  3.5 - 5.1 mmol/L Final   Chloride 05/19/2019 105  98 - 111 mmol/L Final   CO2 05/19/2019 28  22 - 32 mmol/L Final   Glucose, Bld 05/19/2019 122* 70 - 99 mg/dL Final   BUN 05/19/2019 17  8 - 23 mg/dL Final   Creatinine, Ser 05/19/2019 1.26* 0.44 - 1.00 mg/dL Final   Calcium 05/19/2019 9.1  8.9 - 10.3 mg/dL Final   Total Protein 05/19/2019 7.7  6.5 - 8.1 g/dL Final   Albumin 05/19/2019 3.8  3.5 - 5.0 g/dL Final   AST 05/19/2019 14* 15 - 41 U/L Final   ALT 05/19/2019 13  0 - 44 U/L Final   Alkaline Phosphatase 05/19/2019 87  38 - 126 U/L Final   Total Bilirubin 05/19/2019 0.3  0.3 - 1.2 mg/dL Final   GFR calc non Af Amer 05/19/2019 43* >60 mL/min Final   GFR calc Af Amer 05/19/2019 50* >60 mL/min Final   Anion gap 05/19/2019 8  5 - 15 Final   Performed at [facility], [place] [address], [place], [place] [address]   WBC 05/19/2019 4.2  4.0 - 10.5 K/uL Final   RBC 05/19/2019 3.66* 3.87 - 5.11 MIL/uL Final   Hemoglobin 05/19/2019 10.8* 12.0 - 15.0 g/dL Final   HCT 05/19/2019 33.8* 36.0 - 46.0 % Final    MCV 05/19/2019 92.3  80.0 - 100.0 fL Final   MCH 05/19/2019 29.5  26.0 - 34.0 pg Final   MCHC 05/19/2019 32.0  30.0 - 36.0 g/dL Final   RDW 05/19/2019 17.3* 11.5 - 15.5 % Final   Platelets 05/19/2019 236  150 - 400 K/uL Final   nRBC 05/19/2019 0.0  0.0 - 0.2 % Final   Neutrophils Relative % 05/19/2019 56  % Final   Neutro Abs 05/19/2019 2.4  1.7 - 7.7 K/uL Final   Lymphocytes Relative 05/19/2019 29  % Final   Lymphs Abs 05/19/2019 1.2  0.7 - 4.0 K/uL Final   Monocytes Relative 05/19/2019 10  % Final   Monocytes Absolute 05/19/2019 0.4  0.1 - 1.0 K/uL Final   Eosinophils Relative 05/19/2019 1  % Final   Eosinophils Absolute 05/19/2019 0.1  0.0 - 0.5 K/uL Final   Basophils Relative 05/19/2019 3  % Final   Basophils Absolute 05/19/2019 0.1  0.0 - 0.1 K/uL Final   Immature Granulocytes 05/19/2019 1  % Final   Abs Immature Granulocytes 05/19/2019 0.02  0.00 - 0.07 K/uL Final   Performed at [facility], [address] [place], [place] [address]    (this displays the last labs from the last 3 days)  No results found for: TOTALPROTELP, ALBUMINELP, A1GS, A2GS, BETS, BETA2SER, GAMS, MSPIKE, SPEI (this displays SPEP labs)  No results found for: KPAFRELGTCHN, LAMBDASER, KAPLAMBRATIO (kappa/lambda light chains)  No results found for: HGBA, HGBA2QUANT, HGBFQUANT, HGBSQUAN (Hemoglobinopathy evaluation)   No results found for: LDH  No results found for: IRON, TIBC, IRONPCTSAT (Iron and TIBC)  No results found for: FERRITIN  Urinalysis    Component Value Date/Time   COLORURINE AMBER (A) 04/01/2017 0920   APPEARANCEUR CLOUDY (A) 04/01/2017 0920   LABSPEC 1.021 04/01/2017 [place]  5.0 04/01/2017 0920   GLUCOSEU NEGATIVE 04/01/2017 0920   HGBUR MODERATE (A) 04/01/2017 0920   BILIRUBINUR NEGATIVE 04/01/2017 0920   KETONESUR 80 (A) 04/01/2017 0920   PROTEINUR NEGATIVE 04/01/2017 0920   NITRITE NEGATIVE 04/01/2017 0920   LEUKOCYTESUR  LARGE (A) 04/01/2017 0920     STUDIES: No results found.  ELIGIBLE FOR AVAILABLE RESEARCH PROTOCOL: no   ASSESSMENT: 73 y.o. [place], [place] woman status post right breast biopsy of overlapping sites on 08/19/2018 for a clinical T4 N1, stage IIIB invasive lobular carcinoma, estrogen receptor positive, progesterone receptor negative, HER-2 not amplified, with an MIB-1 of 20%.  (a) CT chest/abd/pelvis 09/02/2018 and bone scan 09/10/2018 show no evidence of stage IV disease  (b) breast MRI 09/14/2018 confirms a multicentric 12.6 cm right breast mass infiltrating pectpralis but w/o associated adenopathy  (1) genetics testing to be scheduled  (2) neoadjuvant chemotherapy consisting of cyclophosphamide, methotrexate and fluorouracil every 21 days x 8, started 09/16/2018, last dose 12/18/2018  (a) CMF chemotherapy stopped after 4 cycles, with no evidence of response by MRI NOV 2019  (5) anastrozole started February 2019  (a) bone density to be obtained  (b) palbociclib added 01/19/2019 at 125 mg daily, 21/7  (c) palbociclib dose decreased to 100 mg daily 21 on 7 off, starting with second cycle, April 2020  (d) breast MRI 05/05/2019 shows slight improvement, no evidence of progression, however the tumor is not yet resectable.  Chest CT scan same day shows no evidence of metastatic disease  (e) anastrozole and palbociclib discontinued 08/12/2019 with no significant response  (6) foundation 1/PD-L1 testing:  (a) PD-L1 requested 12/18/2018--insufficient tissue  (b) foundation one testing shows the microsatellite status to be stable, and the mutational burden to be 16/Mb  (c) there are potentially actionable mutations in AKT3, TSC1, KRAS  MAPK1 and RB1  (7) to start paclitaxel 08/18/2019, to be given days 1 and 8 of each 21-day cycle  (8) definitive surgery pending  (9) adjuvant radiation to follow  PLAN: Rainbow is having some evidence of progression in the right breast or at the very least  no significant response after 3 months on anastrozole and palbociclib.  I am concerned that the patient may not be a good historian.  It could be that the eschar we are seeing in the upper lateral section of the breast is really due to trauma but she denies this.  Also when I asked the patient which week of Ibrance she is on (whether 1 of the on weeks or off week) she cannot give me a clear answer.  She does not have her medications in a pillbox but takes them some in the morning and some in the evening.  In short there is a possibility of inadvertent noncompliance here.  However that may be I think we have to change treatment at this point.  We discussed paclitaxel in detail and I discussed the possible toxicities side effects and complications with the patient and the patient's daughter today.  Since he had does have some tingling in her right hand fingertips, but not elsewhere.  Possibly she has some carpal tunnel in the right hand and I suggested she get a wrist splint and wear it at the minimum at night.  Of course the big concern with paclitaxel is neuropathy and they understand this may be permanent if it develops  I am going to try to get her started on 08/18/2019.  We will repeat treatment days 1 and 8 of each 21-day  cycle.  It should be fairly evident after 3 cycles were for the most whether or not we are getting ahead.  The ultimate goal is to be able to get since he had the surgery  They know to call for any other issue that may develop before the next visit.    Zael Shuman, [name], MD  08/12/19 5:26 PM Medical Oncology and Hematology [facility] [address] [place], [place] [address] Tel. [phone]    Fax. [phone]    I, Wilburn Mylar, am acting as scribe for Dr. [name]. Reyan Helle.  I, Lurline Del MD, have reviewed the above documentation for accuracy and completeness, and I agree with the above.

## 2019-08-11 NOTE — Telephone Encounter (Signed)
Received call from pt daughter [name] stating pt is experiencing oozing sores on her right breast.  Pt daughter denies any recent trauma or injury to the breast.  Pt daughter requesting a virtual visit with Dr. Jana Hakim tomorrow so he can visually see the pt affect area and determine the best course of action.  RN will review with MD and call pt back with recommendations.

## 2019-08-12 ENCOUNTER — Inpatient Hospital Stay: Payer: Medicare Other | Attending: Oncology | Admitting: Oncology

## 2019-08-12 ENCOUNTER — Other Ambulatory Visit: Payer: Self-pay

## 2019-08-12 VITALS — BP 145/64 | HR 110 | Temp 99.1°F | Resp 18 | Ht 66.0 in | Wt 201.6 lb

## 2019-08-12 DIAGNOSIS — I63132 Cerebral infarction due to embolism of left carotid artery: Secondary | ICD-10-CM

## 2019-08-12 DIAGNOSIS — N951 Menopausal and female climacteric states: Secondary | ICD-10-CM | POA: Insufficient documentation

## 2019-08-12 DIAGNOSIS — Z5111 Encounter for antineoplastic chemotherapy: Secondary | ICD-10-CM | POA: Insufficient documentation

## 2019-08-12 DIAGNOSIS — C50911 Malignant neoplasm of unspecified site of right female breast: Secondary | ICD-10-CM

## 2019-08-12 DIAGNOSIS — C50811 Malignant neoplasm of overlapping sites of right female breast: Secondary | ICD-10-CM | POA: Diagnosis not present

## 2019-08-12 DIAGNOSIS — Z170 Estrogen receptor positive status [ER+]: Secondary | ICD-10-CM | POA: Diagnosis not present

## 2019-08-12 DIAGNOSIS — R234 Changes in skin texture: Secondary | ICD-10-CM | POA: Diagnosis not present

## 2019-08-12 MED ORDER — LIDOCAINE-PRILOCAINE 2.5-2.5 % EX CREA: TOPICAL_CREAM | CUTANEOUS | 3 refills | Status: DC

## 2019-08-12 MED ORDER — PROCHLORPERAZINE MALEATE 10 MG PO TABS: 10.0000 mg | ORAL_TABLET | Freq: Four times a day (QID) | ORAL | 1 refills | Status: DC | PRN

## 2019-08-12 NOTE — Progress Notes (Signed)
DISCONTINUE OFF PATHWAY REGIMEN - Breast   [id number]:CMF (IV cyclophosphamide) q21 days:   A cycle is every 21 days:     Cyclophosphamide      Methotrexate      5-Fluorouracil   **Always confirm dose/schedule in your pharmacy ordering system**  REASON: Disease Progression PRIOR TREATMENT: Off Pathway: CMF (IV cyclophosphamide) q21 days TREATMENT RESPONSE: Progressive Disease (PD)  START OFF PATHWAY REGIMEN - Breast   [id number]:Paclitaxel 90 mg/m2 Days 1, 8, 15 q28 Days:   A cycle is every 28 days:     Paclitaxel   **Always confirm dose/schedule in your pharmacy ordering system**  Patient Characteristics: Preoperative or Nonsurgical Candidate (Clinical Staging), Neoadjuvant Therapy followed by Surgery, Invasive Disease, Chemotherapy, HER2 Negative/Unknown/Equivocal, ER Positive Therapeutic Status: Preoperative or Nonsurgical Candidate (Clinical Staging) AJCC M Category: cM0 AJCC Grade: G2 Breast Surgical Plan: Neoadjuvant Therapy followed by Surgery ER Status: Positive (+) AJCC 8 Stage Grouping: IIIB HER2 Status: Negative (-) AJCC T Category: cT4 AJCC N Category: cN1 PR Status: Positive (+) Intent of Therapy: Curative Intent, Discussed with Patient

## 2019-08-13 ENCOUNTER — Telehealth: Payer: Self-pay | Admitting: Oncology

## 2019-08-13 NOTE — Telephone Encounter (Signed)
I talk with [name] [name] regarding schedule

## 2019-08-17 NOTE — Progress Notes (Signed)
[facility]  [phone] [phone]    ID: [name] [name] DOB: [birth year]  MR#: [id number]  UMP#:[id number]  Patient Care Team: [name], MD as PCP - General (Internal Medicine) Waynetta Sandy, MD as Consulting Physician (Vascular Surgery) Patsey Berthold, NP as Nurse Practitioner (Cardiology) [name], MD as Consulting Physician (Physical Medicine and Rehabilitation) [name], [name], NP as Nurse Practitioner (Vascular Surgery) Edgardo Roys, PsyD as Consulting Physician (Psychology) Chanice Brenton, [name], MD as Consulting Physician (Oncology) [name], MD as Consulting Physician (General Surgery) Eppie Gibson, MD as Attending Physician (Radiation Oncology) OTHER MD:   CHIEF COMPLAINT: Inflammatory breast cancer  CURRENT TREATMENT: Neoadjuvant chemotherapy   INTERVAL HISTORY: [name] [name] returns today for follow up and treatment of her inflammatory breast cancer.  She is accompanied by her daughter [name] [name].  In light of her disease progression, we are changing her treatment. We plan to start paclitaxel today, to be given on days 1 and 8 of each 21-day cycle.  Since her last visit, she has not undergone any additional studies. Her most recent scans include bilateral breast MRI on 05/05/2019 and a chest CT on 05/10/2019.   REVIEW OF SYSTEMS: [name] [name] reports no new symptoms.  She still has some carpal tunnel issues in her right wrist area.  She says that her vision is occasionally blurred.  She denies headaches, nausea, falls, or any worsening issues regarding her gait.  She does not use a cane or walker.  She does have a cane available at home.  There is been no cough phlegm production pleurisy or worsening shortness of breath, no change in bowel or bladder habits, no unexplained fatigue or weight loss, no fever rash or bleeding.  A detailed review of systems was stable overall   HISTORY OF CURRENT ILLNESS: From the  original intake note:  [name] [name] presented with right breast discoloration and redness. The right breast also had an area of hardness and nipple retraction.  She did not bring this to medical attention but her daughter did mention it when the patient had neurologic follow-up on 08/10/2018.  At that time the nurse practitioner, [name], noted right nipple inversion, hard breast, and red non-ulcerated lesions on the breast.  The patient then underwent bilateral diagnostic mammography with tomography and right breast ultrasonography at [facility] on 08/11/2018 (this is her first ever mammogram) showing: breast density category B. There was a round 2.5 cm mass at the 1 o'clock upper inner quadrant of the right breast.  By palpation this measured approximately 10 cm.  Sonographically the irregular hypoechoic mass measured 2.7 cm. There was also a hypoechoic mass in the right breast at 9 o'clock upper outer quadrant measuring 3.8 cm. There was an abnormal right axillary lymph node with cortical thickening.   Accordingly on 08/19/2018 she proceeded to biopsy of the right breast areas in question. The pathology from this procedure showed [id number]): At the 1 o'clock: invasive lobular carcinoma, grade II. Prognostic indicators significant for: estrogen receptor, 60% positive with moderate staining intensity and progesterone receptor, 0% negative. Proliferation marker Ki67 at 20%. HER2 negative with an immunohistochemistry of (1+).  At the 9 o'clock: invasive lobular carcinoma, grade II. Prognostic indicators significant for: estrogen receptor, 90% positive with strong staining intensity and progesterone receptor, 0% negative. Proliferation marker Ki67 at 30%. HER2 negative with an immunohistochemistry of (1+).  The patient's subsequent history is as detailed below.   PAST MEDICAL HISTORY: Past Medical History:  Diagnosis Date  Anxiety    Cancer ([place])    breast cancer- right   Carotid stenosis     Depression    situational   Dyspnea    with much ambulation   GERD (gastroesophageal reflux disease)    Hypertension    Stroke ([place]) 04/01/2017   a little weakness right side leg and hand, a little memry loss    PAST SURGICAL HISTORY: Past Surgical History:  Procedure Laterality Date   ABDOMINAL HYSTERECTOMY     ENDARTERECTOMY Left 04/08/2017   Procedure: ENDARTERECTOMY CAROTID;  Surgeon: Waynetta Sandy, MD;  Location: [facility];  Service: Vascular;  Laterality: Left;   PATCH ANGIOPLASTY Left 04/08/2017   Procedure: PATCH ANGIOPLASTY Left Carotid;  Surgeon: Waynetta Sandy, MD;  Location: [facility];  Service: Vascular;  Laterality: Left;   PORTACATH PLACEMENT Left 09/07/2018   Procedure: INSERTION PORT-A-CATH LEFT INTERNAL JUGULAR;  Surgeon: [name], MD;  Location: [facility];  Service: General;  Laterality: Left;  total hysterectomy with bilateral salpingo-oophorectomy in her 74's no hormone replacement   FAMILY HISTORY: Family History  Problem Relation Age of Onset   Cancer Mother        ovarian   The patient's father is alive at age 70. The patient's mother died at age 19 due to ovarian cancer. The patient had 3 brothers and 3 sisters. She denies a history of breast, pancreatic, colon, or other cancers in the family.    GYNECOLOGIC HISTORY:  No LMP recorded. Patient has had a hysterectomy. Menarche: Age at first live birth: 73 years old She is GX P1.  She is status post total hysterectomy with BSO in her early 44's. She did not use HRT.    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. She is divorced. At home is just herself, but her two grandsons regularly come to visit and stay with her. The patient's daughter, [name] [name] works in administration for [employer]. The patient's 51 y.o. grandson is a Freight forwarder for [employer]. The patient's 52 y.o. grandson works in [employer].    ADVANCED DIRECTIVES: Not in place.  At the  08/26/2018 visit the patient was given the appropriate documents to complete and notarized at her discretion.   HEALTH MAINTENANCE: Social History   Tobacco Use   Smoking status: Never Smoker   Smokeless tobacco: Never Used  Substance Use Topics   Alcohol use: No   Drug use: No     Colonoscopy: Never  PAP: Status post hysterectomy  Bone density: Never   No Known Allergies  Current Outpatient Medications  Medication Sig Dispense Refill   amLODipine (NORVASC) 10 MG tablet Take 1 tablet (10 mg total) by mouth daily. PATIENT MUST HAVE OFFICE VISIT AND LABS PRIOR TO ANY FURTHER REFILLS 15 tablet 0   atorvastatin (LIPITOR) 80 MG tablet Take 1 tablet (80 mg total) by mouth daily at 6 PM. 30 tablet 0   clopidogrel (PLAVIX) 75 MG tablet TAKE 1 TABLET BY MOUTH EVERY DAY 30 tablet 1   lidocaine-prilocaine (EMLA) cream Apply to affected area once 30 g 3   lisinopril-hydrochlorothiazide (PRINZIDE,ZESTORETIC) 20-12.5 MG tablet Take 1 tablet by mouth daily.  11   loratadine (CLARITIN) 10 MG tablet Take 10 mg by mouth daily as needed for allergies.     potassium chloride SA (K-DUR,KLOR-CON) 20 MEQ tablet Take 1 tablet (20 mEq total) by mouth daily. 30 tablet 0   prochlorperazine (COMPAZINE) 10 MG tablet Take 1 tablet (10  mg total) by mouth every 6 (six) hours as needed (Nausea or vomiting). 30 tablet 1   No current facility-administered medications for this visit.    Facility-Administered Medications Ordered in Other Visits  Medication Dose Route Frequency Provider Last Rate Last Dose   heparin lock flush 100 unit/mL  500 Units Intracatheter Once PRN Ronnell Clinger, [name], MD       sodium chloride flush (NS) 0.9 % injection 10 mL  10 mL Intracatheter PRN Artemis Loyal, [name], MD        OBJECTIVE: Older African-American woman who appears stated age  Vitals:   08/18/19 0833  BP: (!) 157/60  Pulse: (!) 105  Resp: 18  Temp: 98.5 F (36.9 C)  SpO2: 98%     Body mass index is  31.86 kg/m.   Wt Readings from Last 3 Encounters:  08/18/19 197 lb 6.4 oz (89.5 kg)  08/12/19 201 lb 9.6 oz (91.4 kg)  05/19/19 189 lb 12.8 oz (86.1 kg)      ECOG FS:2 - Symptomatic, <50% confined to bed  Of note, the patient has extensive crown baldness at baseline Sclerae unicteric, EOMs intact Wearing a mask No cervical or supraclavicular adenopathy Lungs no rales or rhonchi Heart regular rate and rhythm Abd soft, nontender, positive bowel sounds MSK no focal spinal tenderness, no upper extremity lymphedema Neuro: nonfocal, well oriented, appropriate affect Breasts: The right breast continues to have a palpable erythematous well demarcated area of tumor, which is imaged below.  There is a new nodule medially along the edge of the scar.  There continues to be an eschar superiorly.  The left breast is benign.   Right breast 08/12/2019    Right Breast 04/07/2019     Right Breast 01/08/2019    Right Breast 08/26/2018    LAB RESULTS:  CMP     Component Value Date/Time   NA 141 05/19/2019 1515   K 3.8 05/19/2019 1515   CL 105 05/19/2019 1515   CO2 28 05/19/2019 1515   GLUCOSE 122 (H) 05/19/2019 1515   BUN 17 05/19/2019 1515   CREATININE 1.26 (H) 05/19/2019 1515   CREATININE 1.11 (H) 11/03/2018 1017   CALCIUM 9.1 05/19/2019 1515   PROT 7.7 05/19/2019 1515   ALBUMIN 3.8 05/19/2019 1515   AST 14 (L) 05/19/2019 1515   AST 32 11/03/2018 1017   ALT 13 05/19/2019 1515   ALT 48 (H) 11/03/2018 1017   ALKPHOS 87 05/19/2019 1515   BILITOT 0.3 05/19/2019 1515   BILITOT 0.5 11/03/2018 1017   GFRNONAA 43 (L) 05/19/2019 1515   GFRNONAA 50 (L) 11/03/2018 1017   GFRAA 50 (L) 05/19/2019 1515   GFRAA 58 (L) 11/03/2018 1017    No results found for: TOTALPROTELP, ALBUMINELP, A1GS, A2GS, BETS, BETA2SER, GAMS, MSPIKE, SPEI  No results found for: KPAFRELGTCHN, LAMBDASER, KAPLAMBRATIO  Lab Results  Component Value Date   WBC 4.2 05/19/2019   NEUTROABS 2.4 05/19/2019    HGB 10.8 (L) 05/19/2019   HCT 33.8 (L) 05/19/2019   MCV 92.3 05/19/2019   PLT 236 05/19/2019    _0 @  No results found for: LABCA2  No components found for: UEAVWU981  No results for input(s): INR in the last 168 hours.  No results found for: LABCA2  No results found for: XBJ478  No results found for: GNF621  No results found for: HYQ657  Lab Results  Component Value Date   CA2729 76.2 (H) 05/19/2019    No components found for: HGQUANT  No results  found for: CEA1 / No results found for: CEA1   No results found for: AFPTUMOR  No results found for: CHROMOGRNA  No results found for: PSA1  No visits with results within 3 Day(s) from this visit.  Latest known visit with results is:  Appointment on 05/19/2019  Component Date Value Ref Range Status   CA 27.29 05/19/2019 76.2* 0.0 - 38.6 U/mL Final   Comment: (NOTE) Siemens Centaur Immunochemiluminometric Methodology (ICMA) Values obtained with different assay methods or kits cannot be used interchangeably. Results cannot be interpreted as absolute evidence of the presence or absence of malignant disease. Performed At: [facility] [place], [place] [id number] Rush Farmer MD [id number]    Sodium 05/19/2019 141  135 - 145 mmol/L Final   Potassium 05/19/2019 3.8  3.5 - 5.1 mmol/L Final   Chloride 05/19/2019 105  98 - 111 mmol/L Final   CO2 05/19/2019 28  22 - 32 mmol/L Final   Glucose, Bld 05/19/2019 122* 70 - 99 mg/dL Final   BUN 05/19/2019 17  8 - 23 mg/dL Final   Creatinine, Ser 05/19/2019 1.26* 0.44 - 1.00 mg/dL Final   Calcium 05/19/2019 9.1  8.9 - 10.3 mg/dL Final   Total Protein 05/19/2019 7.7  6.5 - 8.1 g/dL Final   Albumin 05/19/2019 3.8  3.5 - 5.0 g/dL Final   AST 05/19/2019 14* 15 - 41 U/L Final   ALT 05/19/2019 13  0 - 44 U/L Final   Alkaline Phosphatase 05/19/2019 87  38 - 126 U/L Final   Total Bilirubin 05/19/2019 0.3  0.3 - 1.2 mg/dL Final    GFR calc non Af Amer 05/19/2019 43* >60 mL/min Final   GFR calc Af Amer 05/19/2019 50* >60 mL/min Final   Anion gap 05/19/2019 8  5 - 15 Final   Performed at [facility], [place] [address] [place], [place] [address]   WBC 05/19/2019 4.2  4.0 - 10.5 K/uL Final   RBC 05/19/2019 3.66* 3.87 - 5.11 MIL/uL Final   Hemoglobin 05/19/2019 10.8* 12.0 - 15.0 g/dL Final   HCT 05/19/2019 33.8* 36.0 - 46.0 % Final   MCV 05/19/2019 92.3  80.0 - 100.0 fL Final   MCH 05/19/2019 29.5  26.0 - 34.0 pg Final   MCHC 05/19/2019 32.0  30.0 - 36.0 g/dL Final   RDW 05/19/2019 17.3* 11.5 - 15.5 % Final   Platelets 05/19/2019 236  150 - 400 K/uL Final   nRBC 05/19/2019 0.0  0.0 - 0.2 % Final   Neutrophils Relative % 05/19/2019 56  % Final   Neutro Abs 05/19/2019 2.4  1.7 - 7.7 K/uL Final   Lymphocytes Relative 05/19/2019 29  % Final   Lymphs Abs 05/19/2019 1.2  0.7 - 4.0 K/uL Final   Monocytes Relative 05/19/2019 10  % Final   Monocytes Absolute 05/19/2019 0.4  0.1 - 1.0 K/uL Final   Eosinophils Relative 05/19/2019 1  % Final   Eosinophils Absolute 05/19/2019 0.1  0.0 - 0.5 K/uL Final   Basophils Relative 05/19/2019 3  % Final   Basophils Absolute 05/19/2019 0.1  0.0 - 0.1 K/uL Final   Immature Granulocytes 05/19/2019 1  % Final   Abs Immature Granulocytes 05/19/2019 0.02  0.00 - 0.07 K/uL Final   Performed at [facility], [place] [address] [place], [place] [address]    (this displays the last labs from the last 3 days)  No results found for: TOTALPROTELP, ALBUMINELP, A1GS, A2GS, BETS,  BETA2SER, GAMS, MSPIKE, SPEI (this displays SPEP labs)  No results found for: KPAFRELGTCHN, LAMBDASER, KAPLAMBRATIO (kappa/lambda light chains)  No results found for: HGBA, HGBA2QUANT, HGBFQUANT, HGBSQUAN (Hemoglobinopathy evaluation)   No results found for: LDH  No results found for: IRON, TIBC, IRONPCTSAT (Iron and TIBC)  No results found for:  FERRITIN  Urinalysis    Component Value Date/Time   COLORURINE AMBER (A) 04/01/2017 0920   APPEARANCEUR CLOUDY (A) 04/01/2017 0920   LABSPEC 1.021 04/01/2017 0920   PHURINE 5.0 04/01/2017 0920   GLUCOSEU NEGATIVE 04/01/2017 0920   HGBUR MODERATE (A) 04/01/2017 0920   BILIRUBINUR NEGATIVE 04/01/2017 0920   KETONESUR 80 (A) 04/01/2017 0920   PROTEINUR NEGATIVE 04/01/2017 0920   NITRITE NEGATIVE 04/01/2017 0920   LEUKOCYTESUR LARGE (A) 04/01/2017 0920     STUDIES: No results found.  ELIGIBLE FOR AVAILABLE RESEARCH PROTOCOL: no   ASSESSMENT: 73 y.o. [place], [place] woman status post right breast biopsy of overlapping sites on 08/19/2018 for a clinical T4 N1, stage IIIB invasive lobular carcinoma, estrogen receptor positive, progesterone receptor negative, HER-2 not amplified, with an MIB-1 of 20%.  (a) CT chest/abd/pelvis 09/02/2018 and bone scan 09/10/2018 show no evidence of stage IV disease  (b) breast MRI 09/14/2018 confirms a multicentric 12.6 cm right breast mass infiltrating pectpralis but w/o associated adenopathy  (1) genetics testing to be scheduled  (2) neoadjuvant chemotherapy consisting of cyclophosphamide, methotrexate and fluorouracil every 21 days x 8, started 09/16/2018, last dose 12/18/2018  (a) CMF chemotherapy stopped after 4 cycles, with no evidence of response by MRI NOV 2019  (5) anastrozole started February 2019  (a) bone density to be obtained  (b) palbociclib added 01/19/2019 at 125 mg daily, 21/7  (c) palbociclib dose decreased to 100 mg daily 21 on 7 off, starting with second cycle, April 2020  (d) breast MRI 05/05/2019 shows slight improvement, no evidence of progression, however the tumor is not yet resectable.  Chest CT scan same day shows no evidence of metastatic disease  (e) anastrozole and palbociclib discontinued 08/12/2019 with no significant response  (6) foundation 1/PD-L1 testing:  (a) PD-L1 requested 12/18/2018--insufficient tissue  (b)  foundation one testing shows the microsatellite status to be stable, and the mutational burden to be 16/Mb  (c) there are potentially actionable mutations in AKT3, TSC1, KRAS  MAPK1 and RB1  (7) to start paclitaxel 08/18/2019, to be given days 1 and 8 of each 21-day cycle  (8) definitive surgery pending  (9) adjuvant radiation to follow  PLAN: Yeraldi will start her Taxol treatments today.  They have not picked up the EMLA cream or Compazine.  They are using the [facility] long pharmacy and I have put those prescriptions in there for them.  I gave him instructions in writing on how to take the Compazine namely take it this evening before supper and then the next 2 days 3 times a day before meals.  If she has any side effects from Compazine, usually drowsiness, she can cut the pill in half.  We reviewed the possible toxicities side effects and complications of paclitaxel in detail with emphasis on neuropathy.  We will be following this closely.  Note the patient does not have a history of diabetes.  They are also aware of the possibility of low blood counts and risk of infection.  She will keep a diary of symptoms and see me again next week for her day 8 treatment.  I am hoping that we can see some results by the start  of cycle 3.  If she tolerates it well and the tumor shrinking then we would want to continue to maximal response to long as it is well-tolerated, before going on to definitive surgery.  They understand the goal is a clean mastectomy followed by adjuvant radiation and then antiestrogens.    Neftaly Inzunza, [name], MD  08/18/19 9:13 AM Medical Oncology and Hematology [facility] [address] [place], [place] [address] Tel. [phone]    Fax. [phone]    I, Wilburn Mylar, am acting as scribe for Dr. [name]. Noam Karaffa.  I, Lurline Del MD, have reviewed the above documentation for accuracy and completeness, and I agree with the above.

## 2019-08-18 ENCOUNTER — Other Ambulatory Visit: Payer: Medicare Other

## 2019-08-18 ENCOUNTER — Ambulatory Visit: Payer: Medicare Other | Admitting: Adult Health

## 2019-08-18 ENCOUNTER — Other Ambulatory Visit: Payer: Self-pay

## 2019-08-18 ENCOUNTER — Inpatient Hospital Stay: Payer: Medicare Other

## 2019-08-18 ENCOUNTER — Inpatient Hospital Stay (HOSPITAL_BASED_OUTPATIENT_CLINIC_OR_DEPARTMENT_OTHER): Payer: Medicare Other | Admitting: Oncology

## 2019-08-18 VITALS — BP 157/79 | HR 86 | Temp 98.0°F | Resp 18

## 2019-08-18 VITALS — BP 157/60 | HR 105 | Temp 98.5°F | Resp 18 | Ht 66.0 in | Wt 197.4 lb

## 2019-08-18 DIAGNOSIS — C50911 Malignant neoplasm of unspecified site of right female breast: Secondary | ICD-10-CM

## 2019-08-18 DIAGNOSIS — C50811 Malignant neoplasm of overlapping sites of right female breast: Secondary | ICD-10-CM

## 2019-08-18 DIAGNOSIS — Z95828 Presence of other vascular implants and grafts: Secondary | ICD-10-CM

## 2019-08-18 DIAGNOSIS — Z170 Estrogen receptor positive status [ER+]: Secondary | ICD-10-CM

## 2019-08-18 DIAGNOSIS — I69359 Hemiplegia and hemiparesis following cerebral infarction affecting unspecified side: Secondary | ICD-10-CM

## 2019-08-18 DIAGNOSIS — Z5111 Encounter for antineoplastic chemotherapy: Secondary | ICD-10-CM | POA: Diagnosis not present

## 2019-08-18 DIAGNOSIS — I63132 Cerebral infarction due to embolism of left carotid artery: Secondary | ICD-10-CM

## 2019-08-18 DIAGNOSIS — N951 Menopausal and female climacteric states: Secondary | ICD-10-CM | POA: Diagnosis not present

## 2019-08-18 DIAGNOSIS — R234 Changes in skin texture: Secondary | ICD-10-CM | POA: Diagnosis not present

## 2019-08-18 LAB — CBC WITH DIFFERENTIAL/PLATELET
Abs Immature Granulocytes: 0.03 10*3/uL (ref 0.00–0.07)
Basophils Absolute: 0.1 10*3/uL (ref 0.0–0.1)
Basophils Relative: 2 %
Eosinophils Absolute: 0 10*3/uL (ref 0.0–0.5)
Eosinophils Relative: 1 %
HCT: 33.2 % — ABNORMAL LOW (ref 36.0–46.0)
Hemoglobin: 10.8 g/dL — ABNORMAL LOW (ref 12.0–15.0)
Immature Granulocytes: 1 %
Lymphocytes Relative: 24 %
Lymphs Abs: 1.1 10*3/uL (ref 0.7–4.0)
MCH: 29.9 pg (ref 26.0–34.0)
MCHC: 32.5 g/dL (ref 30.0–36.0)
MCV: 92 fL (ref 80.0–100.0)
Monocytes Absolute: 0.6 10*3/uL (ref 0.1–1.0)
Monocytes Relative: 14 %
Neutro Abs: 2.5 10*3/uL (ref 1.7–7.7)
Neutrophils Relative %: 58 %
Platelets: 278 10*3/uL (ref 150–400)
RBC: 3.61 MIL/uL — ABNORMAL LOW (ref 3.87–5.11)
RDW: 14.4 % (ref 11.5–15.5)
WBC: 4.3 10*3/uL (ref 4.0–10.5)
nRBC: 0 % (ref 0.0–0.2)

## 2019-08-18 LAB — COMPREHENSIVE METABOLIC PANEL
ALT: 15 U/L (ref 0–44)
AST: 14 U/L — ABNORMAL LOW (ref 15–41)
Albumin: 3.8 g/dL (ref 3.5–5.0)
Alkaline Phosphatase: 93 U/L (ref 38–126)
Anion gap: 9 (ref 5–15)
BUN: 13 mg/dL (ref 8–23)
CO2: 27 mmol/L (ref 22–32)
Calcium: 9.2 mg/dL (ref 8.9–10.3)
Chloride: 106 mmol/L (ref 98–111)
Creatinine, Ser: 0.89 mg/dL (ref 0.44–1.00)
GFR calc Af Amer: >60 mL/min (ref >60)
GFR calc non Af Amer: >60 mL/min (ref >60)
Glucose, Bld: 117 mg/dL — ABNORMAL HIGH (ref 70–99)
Potassium: 3.4 mmol/L — ABNORMAL LOW (ref 3.5–5.1)
Sodium: 142 mmol/L (ref 135–145)
Total Bilirubin: 0.4 mg/dL (ref 0.3–1.2)
Total Protein: 7.4 g/dL (ref 6.5–8.1)

## 2019-08-18 MED ORDER — FAMOTIDINE IN NACL 20-0.9 MG/50ML-% IV SOLN
INTRAVENOUS | Status: AC
  Filled 2019-08-18: qty 50

## 2019-08-18 MED ORDER — SODIUM CHLORIDE 0.9 % IV SOLN
80.0000 mg/m2 | Freq: Once | INTRAVENOUS | Status: AC
  Administered 2019-08-18: 162 mg via INTRAVENOUS
  Filled 2019-08-18: qty 27

## 2019-08-18 MED ORDER — SODIUM CHLORIDE 0.9 % IV SOLN
Freq: Once | INTRAVENOUS | Status: AC
  Administered 2019-08-18: 11:00:00 via INTRAVENOUS
  Filled 2019-08-18: qty 250

## 2019-08-18 MED ORDER — LIDOCAINE-PRILOCAINE 2.5-2.5 % EX CREA: TOPICAL_CREAM | CUTANEOUS | 3 refills | Status: DC

## 2019-08-18 MED ORDER — DEXAMETHASONE SODIUM PHOSPHATE 10 MG/ML IJ SOLN
INTRAMUSCULAR | Status: AC
  Filled 2019-08-18: qty 1

## 2019-08-18 MED ORDER — PROCHLORPERAZINE MALEATE 10 MG PO TABS: 10.0000 mg | ORAL_TABLET | Freq: Three times a day (TID) | ORAL | 1 refills | Status: DC

## 2019-08-18 MED ORDER — FAMOTIDINE IN NACL 20-0.9 MG/50ML-% IV SOLN
20.0000 mg | Freq: Once | INTRAVENOUS | Status: AC
  Administered 2019-08-18: 20 mg via INTRAVENOUS

## 2019-08-18 MED ORDER — SODIUM CHLORIDE 0.9% FLUSH
10.0000 mL | INTRAVENOUS | Status: DC | PRN
  Administered 2019-08-18: 10 mL
  Filled 2019-08-18: qty 10

## 2019-08-18 MED ORDER — HEPARIN SOD (PORK) LOCK FLUSH 100 UNIT/ML IV SOLN
500.0000 [IU] | Freq: Once | INTRAVENOUS | Status: AC | PRN
  Administered 2019-08-18: 500 [IU]
  Filled 2019-08-18: qty 5

## 2019-08-18 MED ORDER — DEXAMETHASONE SODIUM PHOSPHATE 10 MG/ML IJ SOLN
10.0000 mg | Freq: Once | INTRAMUSCULAR | Status: AC
  Administered 2019-08-18: 10 mg via INTRAVENOUS

## 2019-08-18 MED ORDER — SODIUM CHLORIDE 0.9 % IV SOLN: 10.0000 mg | Freq: Once | INTRAVENOUS | Status: DC

## 2019-08-18 MED ORDER — DIPHENHYDRAMINE HCL 50 MG/ML IJ SOLN
25.0000 mg | Freq: Once | INTRAMUSCULAR | Status: AC
  Administered 2019-08-18: 25 mg via INTRAVENOUS

## 2019-08-18 MED ORDER — DIPHENHYDRAMINE HCL 50 MG/ML IJ SOLN
INTRAMUSCULAR | Status: AC
  Filled 2019-08-18: qty 1

## 2019-08-18 NOTE — Patient Instructions (Addendum)
[facility] Discharge Instructions for Patients Receiving Chemotherapy  Today you received the following chemotherapy agents: Taxol.  To help prevent nausea and vomiting after your treatment, we encourage you to take your nausea medication as directed.   If you develop nausea and vomiting that is not controlled by your nausea medication, call the clinic.   BELOW ARE SYMPTOMS THAT SHOULD BE REPORTED IMMEDIATELY:  *FEVER GREATER THAN 100.5 F  *CHILLS WITH OR WITHOUT FEVER  NAUSEA AND VOMITING THAT IS NOT CONTROLLED WITH YOUR NAUSEA MEDICATION  *UNUSUAL SHORTNESS OF BREATH  *UNUSUAL BRUISING OR BLEEDING  TENDERNESS IN MOUTH AND THROAT WITH OR WITHOUT PRESENCE OF ULCERS  *URINARY PROBLEMS  *BOWEL PROBLEMS  UNUSUAL RASH Items with * indicate a potential emergency and should be followed up as soon as possible.  Feel free to call the clinic should you have any questions or concerns. The clinic phone number is [phone].  Please show the CHEMO ALERT CARD at check-in to the Emergency Department and triage nurse.  Paclitaxel injection What is this medicine? PACLITAXEL (PAK li TAX el) is a chemotherapy drug. It targets fast dividing cells, like cancer cells, and causes these cells to die. This medicine is used to treat ovarian cancer, breast cancer, lung cancer, Kaposi's sarcoma, and other cancers. This medicine may be used for other purposes; ask your health care provider or pharmacist if you have questions. COMMON BRAND NAME(S): Onxol, Taxol What should I tell my health care provider before I take this medicine? They need to know if you have any of these conditions:  history of irregular heartbeat  liver disease  low blood counts, like low white cell, platelet, or red cell counts  lung or breathing disease, like asthma  tingling of the fingers or toes, or other nerve disorder  an unusual or allergic reaction to paclitaxel, alcohol, polyoxyethylated castor  oil, other chemotherapy, other medicines, foods, dyes, or preservatives  pregnant or trying to get pregnant  breast-feeding How should I use this medicine? This drug is given as an infusion into a vein. It is administered in a hospital or clinic by a specially trained health care professional. Talk to your pediatrician regarding the use of this medicine in children. Special care may be needed. Overdosage: If you think you have taken too much of this medicine contact a poison control center or emergency room at once. NOTE: This medicine is only for you. Do not share this medicine with others. What if I miss a dose? It is important not to miss your dose. Call your doctor or health care professional if you are unable to keep an appointment. What may interact with this medicine? Do not take this medicine with any of the following medications:  disulfiram  metronidazole This medicine may also interact with the following medications:  antiviral medicines for hepatitis, HIV or AIDS  certain antibiotics like erythromycin and clarithromycin  certain medicines for fungal infections like ketoconazole and itraconazole  certain medicines for seizures like carbamazepine, phenobarbital, phenytoin  gemfibrozil  nefazodone  rifampin  St. John's wort This list may not describe all possible interactions. Give your health care provider a list of all the medicines, herbs, non-prescription drugs, or dietary supplements you use. Also tell them if you smoke, drink alcohol, or use illegal drugs. Some items may interact with your medicine. What should I watch for while using this medicine? Your condition will be monitored carefully while you are receiving this medicine. You will need important blood work   done while you are taking this medicine. This medicine can cause serious allergic reactions. To reduce your risk you will need to take other medicine(s) before treatment with this medicine. If you  experience allergic reactions like skin rash, itching or hives, swelling of the face, lips, or tongue, tell your doctor or health care professional right away. In some cases, you may be given additional medicines to help with side effects. Follow all directions for their use. This drug may make you feel generally unwell. This is not uncommon, as chemotherapy can affect healthy cells as well as cancer cells. Report any side effects. Continue your course of treatment even though you feel ill unless your doctor tells you to stop. Call your doctor or health care professional for advice if you get a fever, chills or sore throat, or other symptoms of a cold or flu. Do not treat yourself. This drug decreases your body's ability to fight infections. Try to avoid being around people who are sick. This medicine may increase your risk to bruise or bleed. Call your doctor or health care professional if you notice any unusual bleeding. Be careful brushing and flossing your teeth or using a toothpick because you may get an infection or bleed more easily. If you have any dental work done, tell your dentist you are receiving this medicine. Avoid taking products that contain aspirin, acetaminophen, ibuprofen, naproxen, or ketoprofen unless instructed by your doctor. These medicines may hide a fever. Do not become pregnant while taking this medicine. Women should inform their doctor if they wish to become pregnant or think they might be pregnant. There is a potential for serious side effects to an unborn child. Talk to your health care professional or pharmacist for more information. Do not breast-feed an infant while taking this medicine. Men are advised not to father a child while receiving this medicine. This product may contain alcohol. Ask your pharmacist or healthcare provider if this medicine contains alcohol. Be sure to tell all healthcare providers you are taking this medicine. Certain medicines, like metronidazole  and disulfiram, can cause an unpleasant reaction when taken with alcohol. The reaction includes flushing, headache, nausea, vomiting, sweating, and increased thirst. The reaction can last from 30 minutes to several hours. What side effects may I notice from receiving this medicine? Side effects that you should report to your doctor or health care professional as soon as possible:  allergic reactions like skin rash, itching or hives, swelling of the face, lips, or tongue  breathing problems  changes in vision  fast, irregular heartbeat  high or low blood pressure  mouth sores  pain, tingling, numbness in the hands or feet  signs of decreased platelets or bleeding - bruising, pinpoint red spots on the skin, black, tarry stools, blood in the urine  signs of decreased red blood cells - unusually weak or tired, feeling faint or lightheaded, falls  signs of infection - fever or chills, cough, sore throat, pain or difficulty passing urine  signs and symptoms of liver injury like dark yellow or brown urine; general ill feeling or flu-like symptoms; light-colored stools; loss of appetite; nausea; right upper belly pain; unusually weak or tired; yellowing of the eyes or skin  swelling of the ankles, feet, hands  unusually slow heartbeat Side effects that usually do not require medical attention (report to your doctor or health care professional if they continue or are bothersome):  diarrhea  hair loss  loss of appetite  muscle or joint pain    nausea, vomiting  pain, redness, or irritation at site where injected  tiredness This list may not describe all possible side effects. Call your doctor for medical advice about side effects. You may report side effects to FDA at 1-800-FDA-1088. Where should I keep my medicine? This drug is given in a hospital or clinic and will not be stored at home. NOTE: This sheet is a summary. It may not cover all possible information. If you have  questions about this medicine, talk to your doctor, pharmacist, or health care provider.  2020 [employer]/Gold Standard (2017-07-01 13:14:55)  

## 2019-08-19 ENCOUNTER — Telehealth: Payer: Self-pay | Admitting: *Deleted

## 2019-08-19 ENCOUNTER — Telehealth: Payer: Self-pay

## 2019-08-19 LAB — CANCER ANTIGEN 27.29: CA 27.29: 78.6 U/mL — ABNORMAL HIGH (ref 0.0–38.6)

## 2019-08-19 NOTE — Telephone Encounter (Signed)
-----   Message from Cherylynn Ridges, RN sent at 08/19/2019  3:25 PM EDT ----- Regarding: Dr. Jana Hakim New Taxol Contact: [phone] Per Erline Hau RN: "Patient received 1st time taxol on 08/18/2019. Patient tolerated well. No s/s of discomfort or distress."

## 2019-08-19 NOTE — Telephone Encounter (Signed)
Called [name] for chemotherapy F/U.  Elita Quick identified self as daughter.  Reports patient is doing well.  Denies n/v.  Denies any new side effects or symptoms.  Bowel and bladder functioning well.  Eating well but not drinking as much water as she should.    Instructed to drink 64 oz minimum daily or at least the day before, of and after treatment for hydration, bowels and flush medication biproducts.  Reports knowing how to help potasium level.  Currently denies further questions or needs.  Encouraged to call [phone] Mon -Fri 8:00 am - 4:30 pm or anytime as needed for symptoms, changes or event outside office hours.

## 2019-08-19 NOTE — Telephone Encounter (Signed)
LVM for patients daughter, [name].  Informed that patient potassium level is slightly low and she should increase potassium intake through diet.  Left center number for call back with questions.

## 2019-08-19 NOTE — Telephone Encounter (Signed)
-----   Message from Gardenia Phlegm, NP sent at 08/18/2019  9:52 PM EDT ----- Encourage patient to increase potassium intake, it is slightly decreased ----- Message ----- From: Interface, Lab In [place] Sent: 08/18/2019   9:55 AM EDT To: Chauncey Cruel, MD

## 2019-08-25 ENCOUNTER — Inpatient Hospital Stay: Payer: Medicare Other

## 2019-08-25 ENCOUNTER — Other Ambulatory Visit: Payer: Self-pay

## 2019-08-25 ENCOUNTER — Inpatient Hospital Stay (HOSPITAL_BASED_OUTPATIENT_CLINIC_OR_DEPARTMENT_OTHER): Payer: Medicare Other | Admitting: Oncology

## 2019-08-25 VITALS — BP 158/68 | HR 101 | Temp 98.5°F | Resp 18 | Ht 66.0 in | Wt 197.4 lb

## 2019-08-25 VITALS — BP 137/60 | HR 91 | Resp 18

## 2019-08-25 DIAGNOSIS — I63132 Cerebral infarction due to embolism of left carotid artery: Secondary | ICD-10-CM | POA: Diagnosis not present

## 2019-08-25 DIAGNOSIS — Z170 Estrogen receptor positive status [ER+]: Secondary | ICD-10-CM

## 2019-08-25 DIAGNOSIS — Z95828 Presence of other vascular implants and grafts: Secondary | ICD-10-CM

## 2019-08-25 DIAGNOSIS — C50811 Malignant neoplasm of overlapping sites of right female breast: Secondary | ICD-10-CM

## 2019-08-25 DIAGNOSIS — C50911 Malignant neoplasm of unspecified site of right female breast: Secondary | ICD-10-CM | POA: Diagnosis not present

## 2019-08-25 DIAGNOSIS — R234 Changes in skin texture: Secondary | ICD-10-CM | POA: Diagnosis not present

## 2019-08-25 DIAGNOSIS — Z5111 Encounter for antineoplastic chemotherapy: Secondary | ICD-10-CM | POA: Diagnosis not present

## 2019-08-25 DIAGNOSIS — I69359 Hemiplegia and hemiparesis following cerebral infarction affecting unspecified side: Secondary | ICD-10-CM

## 2019-08-25 DIAGNOSIS — N951 Menopausal and female climacteric states: Secondary | ICD-10-CM | POA: Diagnosis not present

## 2019-08-25 LAB — CBC WITH DIFFERENTIAL/PLATELET
Abs Immature Granulocytes: 0.11 10*3/uL — ABNORMAL HIGH (ref 0.00–0.07)
Basophils Absolute: 0.1 10*3/uL (ref 0.0–0.1)
Basophils Relative: 2 %
Eosinophils Absolute: 0.1 10*3/uL (ref 0.0–0.5)
Eosinophils Relative: 3 %
HCT: 33.7 % — ABNORMAL LOW (ref 36.0–46.0)
Hemoglobin: 10.7 g/dL — ABNORMAL LOW (ref 12.0–15.0)
Immature Granulocytes: 3 %
Lymphocytes Relative: 21 %
Lymphs Abs: 0.9 10*3/uL (ref 0.7–4.0)
MCH: 29.6 pg (ref 26.0–34.0)
MCHC: 31.8 g/dL (ref 30.0–36.0)
MCV: 93.4 fL (ref 80.0–100.0)
Monocytes Absolute: 0.4 10*3/uL (ref 0.1–1.0)
Monocytes Relative: 10 %
Neutro Abs: 2.6 10*3/uL (ref 1.7–7.7)
Neutrophils Relative %: 61 %
Platelets: 273 10*3/uL (ref 150–400)
RBC: 3.61 MIL/uL — ABNORMAL LOW (ref 3.87–5.11)
RDW: 14.2 % (ref 11.5–15.5)
WBC: 4.2 10*3/uL (ref 4.0–10.5)
nRBC: 0.5 % — ABNORMAL HIGH (ref 0.0–0.2)

## 2019-08-25 LAB — COMPREHENSIVE METABOLIC PANEL WITH GFR
ALT: 21 U/L (ref 0–44)
AST: 17 U/L (ref 15–41)
Albumin: 3.8 g/dL (ref 3.5–5.0)
Alkaline Phosphatase: 93 U/L (ref 38–126)
Anion gap: 10 (ref 5–15)
BUN: 11 mg/dL (ref 8–23)
CO2: 27 mmol/L (ref 22–32)
Calcium: 9.2 mg/dL (ref 8.9–10.3)
Chloride: 105 mmol/L (ref 98–111)
Creatinine, Ser: 0.84 mg/dL (ref 0.44–1.00)
GFR calc Af Amer: >60 mL/min
GFR calc non Af Amer: >60 mL/min
Glucose, Bld: 115 mg/dL — ABNORMAL HIGH (ref 70–99)
Potassium: 3.7 mmol/L (ref 3.5–5.1)
Sodium: 142 mmol/L (ref 135–145)
Total Bilirubin: 0.4 mg/dL (ref 0.3–1.2)
Total Protein: 7.5 g/dL (ref 6.5–8.1)

## 2019-08-25 MED ORDER — SODIUM CHLORIDE 0.9% FLUSH
10.0000 mL | INTRAVENOUS | Status: DC | PRN
  Administered 2019-08-25: 10 mL
  Filled 2019-08-25: qty 10

## 2019-08-25 MED ORDER — FAMOTIDINE IN NACL 20-0.9 MG/50ML-% IV SOLN
20.0000 mg | Freq: Once | INTRAVENOUS | Status: AC
  Administered 2019-08-25: 20 mg via INTRAVENOUS

## 2019-08-25 MED ORDER — FAMOTIDINE IN NACL 20-0.9 MG/50ML-% IV SOLN
INTRAVENOUS | Status: AC
  Filled 2019-08-25: qty 50

## 2019-08-25 MED ORDER — DEXAMETHASONE SODIUM PHOSPHATE 10 MG/ML IJ SOLN
10.0000 mg | Freq: Once | INTRAMUSCULAR | Status: AC
  Administered 2019-08-25: 10 mg via INTRAVENOUS

## 2019-08-25 MED ORDER — SODIUM CHLORIDE 0.9 % IV SOLN
80.0000 mg/m2 | Freq: Once | INTRAVENOUS | Status: AC
  Administered 2019-08-25: 162 mg via INTRAVENOUS
  Filled 2019-08-25: qty 27

## 2019-08-25 MED ORDER — DIPHENHYDRAMINE HCL 50 MG/ML IJ SOLN
25.0000 mg | Freq: Once | INTRAMUSCULAR | Status: AC
  Administered 2019-08-25: 25 mg via INTRAVENOUS

## 2019-08-25 MED ORDER — SODIUM CHLORIDE 0.9% FLUSH
10.0000 mL | INTRAVENOUS | Status: DC | PRN
  Filled 2019-08-25: qty 10

## 2019-08-25 MED ORDER — DEXAMETHASONE SODIUM PHOSPHATE 10 MG/ML IJ SOLN
INTRAMUSCULAR | Status: AC
  Filled 2019-08-25: qty 1

## 2019-08-25 MED ORDER — HEPARIN SOD (PORK) LOCK FLUSH 100 UNIT/ML IV SOLN
500.0000 [IU] | Freq: Once | INTRAVENOUS | Status: AC | PRN
  Administered 2019-08-25: 500 [IU]
  Filled 2019-08-25: qty 5

## 2019-08-25 MED ORDER — SODIUM CHLORIDE 0.9 % IV SOLN
Freq: Once | INTRAVENOUS | Status: AC
  Administered 2019-08-25: 10:00:00 via INTRAVENOUS
  Filled 2019-08-25: qty 250

## 2019-08-25 MED ORDER — DIPHENHYDRAMINE HCL 50 MG/ML IJ SOLN
INTRAMUSCULAR | Status: AC
  Filled 2019-08-25: qty 1

## 2019-08-25 MED FILL — LISINOPRIL-HCTZ 20-12.5 MG: 20-12.5 | 90 days supply | Qty: 90 | Fill #0

## 2019-08-25 NOTE — Patient Instructions (Signed)
[facility] Discharge Instructions for Patients Receiving Chemotherapy  Today you received the following chemotherapy agents Paclitaxel  To help prevent nausea and vomiting after your treatment, we encourage you to take your nausea medication as directed   If you develop nausea and vomiting that is not controlled by your nausea medication, call the clinic.   BELOW ARE SYMPTOMS THAT SHOULD BE REPORTED IMMEDIATELY:  *FEVER GREATER THAN 100.5 F  *CHILLS WITH OR WITHOUT FEVER  NAUSEA AND VOMITING THAT IS NOT CONTROLLED WITH YOUR NAUSEA MEDICATION  *UNUSUAL SHORTNESS OF BREATH  *UNUSUAL BRUISING OR BLEEDING  TENDERNESS IN MOUTH AND THROAT WITH OR WITHOUT PRESENCE OF ULCERS  *URINARY PROBLEMS  *BOWEL PROBLEMS  UNUSUAL RASH Items with * indicate a potential emergency and should be followed up as soon as possible.  Feel free to call the clinic should you have any questions or concerns. The clinic phone number is [phone].  Please show the CHEMO ALERT CARD at check-in to the Emergency Department and triage nurse.   

## 2019-08-25 NOTE — Progress Notes (Signed)
[place]  Telephone:(336) [phone] Fax:(336) [phone]    ID: [name] [name] DOB: 28-Apr-1946  MR#: [id number]  ZMO#:[id number]  Patient Care Team: [name], MD as PCP - General (Internal Medicine) Waynetta Sandy, MD as Consulting Physician (Vascular Surgery) Patsey Berthold, NP as Nurse Practitioner (Cardiology) [name], MD as Consulting Physician (Physical Medicine and Rehabilitation) [name], [name], NP as Nurse Practitioner (Vascular Surgery) Edgardo Roys, PsyD as Consulting Physician (Psychology) , [name], MD as Consulting Physician (Oncology) [name], MD as Consulting Physician (General Surgery) Eppie Gibson, MD as Attending Physician (Radiation Oncology) OTHER MD:   CHIEF COMPLAINT: Inflammatory breast cancer  CURRENT TREATMENT: Neoadjuvant chemotherapy   INTERVAL HISTORY: Kaleisha returns today for follow-up and treatment of her inflammatory breast cancer. She was last seen here on 08/18/2019.   She has been started on paclitaxel on days 1 and 8 of every 21 day cycle. Today is day 8 cycle 1.  She tolerated her first cycle remarkably well.  The port worked well.  She had no mouth sores, no nausea or vomiting, no change in appetite.  She continues bit fatigued but no change from baseline.  She had 1 loose bowel movement.  She has had no neuropathy.  Since her last visit here, she has not undergone any additional studies.     REVIEW OF SYSTEMS: Dayami's detailed review of systems today was otherwise entirely noncontributory   HISTORY OF CURRENT ILLNESS: From the original intake note:  [name] [name] presented with right breast discoloration and redness. The right breast also had an area of hardness and nipple retraction.  She did not bring this to medical attention but her daughter did mention it when the patient had neurologic follow-up on 08/10/2018.  At that time the nurse practitioner, [name],  noted right nipple inversion, hard breast, and red non-ulcerated lesions on the breast.  The patient then underwent bilateral diagnostic mammography with tomography and right breast ultrasonography at [facility] on 08/11/2018 (this is her first ever mammogram) showing: breast density category B. There was a round 2.5 cm mass at the 1 o'clock upper inner quadrant of the right breast.  By palpation this measured approximately 10 cm.  Sonographically the irregular hypoechoic mass measured 2.7 cm. There was also a hypoechoic mass in the right breast at 9 o'clock upper outer quadrant measuring 3.8 cm. There was an abnormal right axillary lymph node with cortical thickening.   Accordingly on 08/19/2018 she proceeded to biopsy of the right breast areas in question. The pathology from this procedure showed [phone]): At the 1 o'clock: invasive lobular carcinoma, grade II. Prognostic indicators significant for: estrogen receptor, 60% positive with moderate staining intensity and progesterone receptor, 0% negative. Proliferation marker Ki67 at 20%. HER2 negative with an immunohistochemistry of (1+).  At the 9 o'clock: invasive lobular carcinoma, grade II. Prognostic indicators significant for: estrogen receptor, 90% positive with strong staining intensity and progesterone receptor, 0% negative. Proliferation marker Ki67 at 30%. HER2 negative with an immunohistochemistry of (1+).  The patient's subsequent history is as detailed below.   PAST MEDICAL HISTORY: Past Medical History:  Diagnosis Date  . Anxiety   . Cancer [facility])    breast cancer- right  . Carotid stenosis   . Depression    situational  . Dyspnea    with much ambulation  . GERD (gastroesophageal reflux disease)   . Hypertension   . Stroke ([place]) 04/01/2017   a little weakness right side leg and hand,  a little memry loss    PAST SURGICAL HISTORY: Past Surgical History:  Procedure Laterality Date  . ABDOMINAL HYSTERECTOMY    . ENDARTERECTOMY  Left 04/08/2017   Procedure: ENDARTERECTOMY CAROTID;  Surgeon: Waynetta Sandy, MD;  Location: [place];  Service: Vascular;  Laterality: Left;  . PATCH ANGIOPLASTY Left 04/08/2017   Procedure: PATCH ANGIOPLASTY Left Carotid;  Surgeon: Waynetta Sandy, MD;  Location: [place];  Service: Vascular;  Laterality: Left;  . PORTACATH PLACEMENT Left 09/07/2018   Procedure: INSERTION PORT-A-CATH LEFT INTERNAL JUGULAR;  Surgeon: [name], MD;  Location: [place];  Service: General;  Laterality: Left;  total hysterectomy with bilateral salpingo-oophorectomy in her 28's no hormone replacement   FAMILY HISTORY: Family History  Problem Relation Age of Onset  . Cancer Mother        ovarian   The patient's father is alive at age 66. The patient's mother died at age 74 due to ovarian cancer. The patient had 3 brothers and 3 sisters. She denies a history of breast, pancreatic, colon, or other cancers in the family.    GYNECOLOGIC HISTORY:  No LMP recorded. Patient has had a hysterectomy. Menarche: Age at first live birth: 73 years old She is GX P1.  She is status post total hysterectomy with BSO in her early 61's. She did not use HRT.    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. She is divorced. At home is just herself, but her two grandsons regularly come to visit and stay with her. The patient's daughter, [name] works in administration for [employer]. The patient's 42 y.o. grandson is a Freight forwarder for [employer]. The patient's 66 y.o. grandson works in [employer].    ADVANCED DIRECTIVES: Not in place.  At the 08/26/2018 visit the patient was given the appropriate documents to complete and notarized at her discretion.   HEALTH MAINTENANCE: Social History   Tobacco Use  . Smoking status: Never Smoker  . Smokeless tobacco: Never Used  Substance Use Topics  . Alcohol use: No  . Drug use: No     Colonoscopy: Never  PAP: Status post  hysterectomy  Bone density: Never   No Known Allergies  Current Outpatient Medications  Medication Sig Dispense Refill  . amLODipine (NORVASC) 10 MG tablet Take 1 tablet (10 mg total) by mouth daily. PATIENT MUST HAVE OFFICE VISIT AND LABS PRIOR TO ANY FURTHER REFILLS 15 tablet 0  . atorvastatin (LIPITOR) 80 MG tablet Take 1 tablet (80 mg total) by mouth daily at 6 PM. 30 tablet 0  . clopidogrel (PLAVIX) 75 MG tablet TAKE 1 TABLET BY MOUTH EVERY DAY 30 tablet 1  . lidocaine-prilocaine (EMLA) cream Apply to affected area once 30 g 3  . lisinopril-hydrochlorothiazide (PRINZIDE,ZESTORETIC) 20-12.5 MG tablet Take 1 tablet by mouth daily.  11  . loratadine (CLARITIN) 10 MG tablet Take 10 mg by mouth daily as needed for allergies.    . potassium chloride SA (K-DUR,KLOR-CON) 20 MEQ tablet Take 1 tablet (20 mEq total) by mouth daily. 30 tablet 0  . prochlorperazine (COMPAZINE) 10 MG tablet Take 1 tablet (10 mg total) by mouth 3 (three) times daily before meals. 30 tablet 1   No current facility-administered medications for this visit.    Facility-Administered Medications Ordered in Other Visits  Medication Dose Route Frequency Provider Last Rate Last Dose  . heparin lock flush 100 unit/mL  500 Units Intracatheter Once PRN , [name], MD      .  sodium chloride flush (NS) 0.9 % injection 10 mL  10 mL Intracatheter PRN , [name], MD        OBJECTIVE: Older African-American woman in no acute distress  Vitals:   08/25/19 0900  BP: (!) 158/68  Pulse: (!) 101  Resp: 18  Temp: 98.5 F (36.9 C)  SpO2: 99%   Wt Readings from Last 3 Encounters:  08/25/19 197 lb 6.4 oz (89.5 kg)  08/18/19 197 lb 6.4 oz (89.5 kg)  08/12/19 201 lb 9.6 oz (91.4 kg)   Body mass index is 31.86 kg/m.    ECOG FS:2 - Symptomatic, <50% confined to bed  Ocular: Sclerae unicteric, pupils round and equal Ear-nose-throat: Wearing a mask Lymphatic: No cervical or supraclavicular adenopathy Lungs no  rales or rhonchi Heart regular rate and rhythm Abd soft, nontender, positive bowel sounds MSK no focal spinal tenderness, no joint edema Neuro: non-focal, well-oriented, appropriate affect Breasts: The right breast has the same erythematous area, unchanged from last week, possibly a little bit less inflamed.  Left breast is benign.  Both axillae are benign.   Right breast 08/12/2019    Right Breast 04/07/2019     Right Breast 01/08/2019    Right Breast 08/26/2018    LAB RESULTS:  CMP     Component Value Date/Time   NA 142 08/18/2019 0945   K 3.4 (L) 08/18/2019 0945   CL 106 08/18/2019 0945   CO2 27 08/18/2019 0945   GLUCOSE 117 (H) 08/18/2019 0945   BUN 13 08/18/2019 0945   CREATININE 0.89 08/18/2019 0945   CREATININE 1.11 (H) 11/03/2018 1017   CALCIUM 9.2 08/18/2019 0945   PROT 7.4 08/18/2019 0945   ALBUMIN 3.8 08/18/2019 0945   AST 14 (L) 08/18/2019 0945   AST 32 11/03/2018 1017   ALT 15 08/18/2019 0945   ALT 48 (H) 11/03/2018 1017   ALKPHOS 93 08/18/2019 0945   BILITOT 0.4 08/18/2019 0945   BILITOT 0.5 11/03/2018 1017   GFRNONAA >60 08/18/2019 0945   GFRNONAA 50 (L) 11/03/2018 1017   GFRAA >60 08/18/2019 0945   GFRAA 58 (L) 11/03/2018 1017    No results found for: TOTALPROTELP, ALBUMINELP, A1GS, A2GS, BETS, BETA2SER, GAMS, MSPIKE, SPEI  No results found for: KPAFRELGTCHN, LAMBDASER, KAPLAMBRATIO  Lab Results  Component Value Date   WBC 4.3 08/18/2019   NEUTROABS 2.5 08/18/2019   HGB 10.8 (L) 08/18/2019   HCT 33.2 (L) 08/18/2019   MCV 92.0 08/18/2019   PLT 278 08/18/2019    _0 @  No results found for: LABCA2  No components found for: UQJFHL456  No results for input(s): INR in the last 168 hours.  No results found for: LABCA2  No results found for: YBW389  No results found for: HTD428  No results found for: JGO115  Lab Results  Component Value Date   CA2729 78.6 (H) 08/18/2019    No components found for: HGQUANT  No  results found for: CEA1 / No results found for: CEA1   No results found for: AFPTUMOR  No results found for: CHROMOGRNA  No results found for: PSA1  No visits with results within 3 Day(s) from this visit.  Latest known visit with results is:  Appointment on 08/18/2019  Component Date Value Ref Range Status  . CA 27.29 08/18/2019 78.6* 0.0 - 38.6 U/mL Final   Comment: (NOTE) Siemens Centaur Immunochemiluminometric Methodology [facility]) Values obtained with different assay methods or kits cannot be used interchangeably. Results cannot be interpreted as absolute evidence of  the presence or absence of malignant disease. Performed At: [facility] [place], [place] [id number] Rush Farmer MD [id number]   . WBC 08/18/2019 4.3  4.0 - 10.5 K/uL Final  . RBC 08/18/2019 3.61* 3.87 - 5.11 MIL/uL Final  . Hemoglobin 08/18/2019 10.8* 12.0 - 15.0 g/dL Final  . HCT 08/18/2019 33.2* 36.0 - 46.0 % Final  . MCV 08/18/2019 92.0  80.0 - 100.0 fL Final  . MCH 08/18/2019 29.9  26.0 - 34.0 pg Final  . MCHC 08/18/2019 32.5  30.0 - 36.0 g/dL Final  . RDW 08/18/2019 14.4  11.5 - 15.5 % Final  . Platelets 08/18/2019 278  150 - 400 K/uL Final  . nRBC 08/18/2019 0.0  0.0 - 0.2 % Final  . Neutrophils Relative % 08/18/2019 58  % Final  . Neutro Abs 08/18/2019 2.5  1.7 - 7.7 K/uL Final  . Lymphocytes Relative 08/18/2019 24  % Final  . Lymphs Abs 08/18/2019 1.1  0.7 - 4.0 K/uL Final  . Monocytes Relative 08/18/2019 14  % Final  . Monocytes Absolute 08/18/2019 0.6  0.1 - 1.0 K/uL Final  . Eosinophils Relative 08/18/2019 1  % Final  . Eosinophils Absolute 08/18/2019 0.0  0.0 - 0.5 K/uL Final  . Basophils Relative 08/18/2019 2  % Final  . Basophils Absolute 08/18/2019 0.1  0.0 - 0.1 K/uL Final  . Immature Granulocytes 08/18/2019 1  % Final  . Abs Immature Granulocytes 08/18/2019 0.03  0.00 - 0.07 K/uL Final   Performed at [facility], [place] [address],  [place], [place] [address]  . Sodium 08/18/2019 142  135 - 145 mmol/L Corrected  . Potassium 08/18/2019 3.4* 3.5 - 5.1 mmol/L Corrected  . Chloride 08/18/2019 106  98 - 111 mmol/L Corrected  . CO2 08/18/2019 27  22 - 32 mmol/L Corrected  . Glucose, Bld 08/18/2019 117* 70 - 99 mg/dL Corrected  . BUN 08/18/2019 13  8 - 23 mg/dL Corrected  . Creatinine, Ser 08/18/2019 0.89  0.44 - 1.00 mg/dL Corrected  . Calcium 08/18/2019 9.2  8.9 - 10.3 mg/dL Corrected  . Total Protein 08/18/2019 7.4  6.5 - 8.1 g/dL Corrected  . Albumin 08/18/2019 3.8  3.5 - 5.0 g/dL Corrected  . AST 08/18/2019 14* 15 - 41 U/L Corrected  . ALT 08/18/2019 15  0 - 44 U/L Corrected  . Alkaline Phosphatase 08/18/2019 93  38 - 126 U/L Corrected  . Total Bilirubin 08/18/2019 0.4  0.3 - 1.2 mg/dL Corrected  . GFR calc non Af Amer 08/18/2019 >60  >60 mL/min Corrected  . GFR calc Af Amer 08/18/2019 >60  >60 mL/min Corrected  . Anion gap 08/18/2019 9  5 - 15 Corrected   Performed at [facility], [address] [place], [place] [address]    (this displays the last labs from the last 3 days)  No results found for: TOTALPROTELP, ALBUMINELP, A1GS, A2GS, BETS, BETA2SER, GAMS, MSPIKE, SPEI (this displays SPEP labs)  No results found for: KPAFRELGTCHN, LAMBDASER, KAPLAMBRATIO (kappa/lambda light chains)  No results found for: HGBA, HGBA2QUANT, HGBFQUANT, HGBSQUAN (Hemoglobinopathy evaluation)   No results found for: LDH  No results found for: IRON, TIBC, IRONPCTSAT (Iron and TIBC)  No results found for: FERRITIN  Urinalysis    Component Value Date/Time   COLORURINE AMBER (A) 04/01/2017 0920   APPEARANCEUR CLOUDY (A) 04/01/2017 0920   LABSPEC 1.021 04/01/2017 0920   PHURINE 5.0 04/01/2017 0920   GLUCOSEU NEGATIVE 04/01/2017 0920  HGBUR MODERATE (A) 04/01/2017 0920   BILIRUBINUR NEGATIVE 04/01/2017 0920   KETONESUR 80 (A) 04/01/2017 0920   PROTEINUR NEGATIVE 04/01/2017 0920   NITRITE NEGATIVE  04/01/2017 0920   LEUKOCYTESUR LARGE (A) 04/01/2017 0920     STUDIES: No results found.  ELIGIBLE FOR AVAILABLE RESEARCH PROTOCOL: no   ASSESSMENT: 73 y.o. [place], [place] woman status post right breast biopsy of overlapping sites on 08/19/2018 for a clinical T4 N1, stage IIIB invasive lobular carcinoma, estrogen receptor positive, progesterone receptor negative, HER-2 not amplified, with an MIB-1 of 20%.  (a) CT chest/abd/pelvis 09/02/2018 and bone scan 09/10/2018 show no evidence of stage IV disease  (b) breast MRI 09/14/2018 confirms a multicentric 12.6 cm right breast mass infiltrating pectpralis but w/o associated adenopathy  (1) genetics testing to be scheduled  (2) neoadjuvant chemotherapy consisting of cyclophosphamide, methotrexate and fluorouracil every 21 days x 8, started 09/16/2018, last dose 12/18/2018  (a) CMF chemotherapy stopped after 4 cycles, with no evidence of response by MRI NOV 2019  (5) anastrozole started February 2019  (a) bone density to be obtained  (b) palbociclib added 01/19/2019 at 125 mg daily, 21/7  (c) palbociclib dose decreased to 100 mg daily 21 on 7 off, starting with second cycle, April 2020  (d) breast MRI 05/05/2019 shows slight improvement, no evidence of progression, however the tumor is not yet resectable.  Chest CT scan same day shows no evidence of metastatic disease  (e) anastrozole and palbociclib discontinued 08/12/2019 with no significant response  (6) foundation 1/PD-L1 testing:  (a) PD-L1 requested 12/18/2018--insufficient tissue  (b) foundation one testing shows the microsatellite status to be stable, and the mutational burden to be 16/Mb  (c) there are potentially actionable mutations in AKT3, TSC1, KRAS  MAPK1 and RB1  (7) to start paclitaxel 08/18/2019, to be given days 1 and 8 of each 21-day cycle  (8) definitive surgery pending  (9) adjuvant radiation to follow   PLAN: Anyah tolerated her first dose of paclitaxel  remarkably well.  I do not yet have her labs today but assuming those are adequate, she will proceed to day 8 treatment.  She will then see [place] on the day 1 of each 21-day cycle and of course we will be following her right breast for response  I have encouraged her to be as active as possible, and to maintain fall precautions.  She does not use a cane or walker when she visits [place] and that remains a concern  We will be following for possible neuropathy at each visit as well  She understands she will be losing her hair sometime within the next 2 weeks.  She knows to call for any other issue that may develop before her next visit, particularly if any temperature were to develop.  , [name], MD  08/25/19 9:29 AM Medical Oncology and Hematology [facility] [place], [place] [address] Tel. [phone]    Fax. [phone]  I, Jacqualyn Posey am acting as a Education administrator for Chauncey Cruel, MD.   I, Lurline Del MD, have reviewed the above documentation for accuracy and completeness, and I agree with the above.

## 2019-09-08 ENCOUNTER — Other Ambulatory Visit: Payer: Self-pay

## 2019-09-08 ENCOUNTER — Inpatient Hospital Stay (HOSPITAL_BASED_OUTPATIENT_CLINIC_OR_DEPARTMENT_OTHER): Payer: Medicare Other | Admitting: Adult Health

## 2019-09-08 ENCOUNTER — Inpatient Hospital Stay: Payer: Medicare Other

## 2019-09-08 ENCOUNTER — Encounter: Payer: Self-pay | Admitting: Adult Health

## 2019-09-08 ENCOUNTER — Other Ambulatory Visit: Payer: Self-pay | Admitting: Adult Health

## 2019-09-08 VITALS — BP 156/53 | HR 103 | Temp 98.7°F | Resp 18 | Ht 66.0 in | Wt 200.2 lb

## 2019-09-08 DIAGNOSIS — I63132 Cerebral infarction due to embolism of left carotid artery: Secondary | ICD-10-CM

## 2019-09-08 DIAGNOSIS — R269 Unspecified abnormalities of gait and mobility: Secondary | ICD-10-CM

## 2019-09-08 DIAGNOSIS — C50811 Malignant neoplasm of overlapping sites of right female breast: Secondary | ICD-10-CM

## 2019-09-08 DIAGNOSIS — Z170 Estrogen receptor positive status [ER+]: Secondary | ICD-10-CM | POA: Diagnosis not present

## 2019-09-08 DIAGNOSIS — I69359 Hemiplegia and hemiparesis following cerebral infarction affecting unspecified side: Secondary | ICD-10-CM

## 2019-09-08 DIAGNOSIS — R234 Changes in skin texture: Secondary | ICD-10-CM | POA: Diagnosis not present

## 2019-09-08 DIAGNOSIS — R5383 Other fatigue: Secondary | ICD-10-CM

## 2019-09-08 DIAGNOSIS — I69398 Other sequelae of cerebral infarction: Secondary | ICD-10-CM

## 2019-09-08 DIAGNOSIS — Z5111 Encounter for antineoplastic chemotherapy: Secondary | ICD-10-CM | POA: Diagnosis not present

## 2019-09-08 DIAGNOSIS — N951 Menopausal and female climacteric states: Secondary | ICD-10-CM | POA: Diagnosis not present

## 2019-09-08 NOTE — Progress Notes (Signed)
[place]  [phone] [phone]    ID: [name] [name] DOB: 02-Jan-1946  MR#: [id number]  LOV#:[id number]  Patient Care Team: [name], MD as PCP - General (Internal Medicine) Waynetta Sandy, MD as Consulting Physician (Vascular Surgery) Patsey Berthold, NP as Nurse Practitioner (Cardiology) [name], MD as Consulting Physician (Physical Medicine and Rehabilitation) [name], [name], NP as Nurse Practitioner (Vascular Surgery) Edgardo Roys, PsyD as Consulting Physician (Psychology) Magrinat, [name], MD as Consulting Physician (Oncology) [name], MD as Consulting Physician (General Surgery) Eppie Gibson, MD as Attending Physician (Radiation Oncology) OTHER MD:   CHIEF COMPLAINT: Inflammatory breast cancer  CURRENT TREATMENT: Neoadjuvant chemotherapy  INTERVAL HISTORY: [name] [name] returns today for follow-up and treatment of her inflammatory breast cancer.  She is accompanied by her daughter [name] [name].    She has been started on paclitaxel on days 1 and 8 of every 21 day cycle. Today is day 1 cycle 2.     REVIEW OF SYSTEMS: Last week [name] [name] was increasingly weak.  This was noted last week after her treatment around 10/21.  This happened the entire day, and she had difficulty with standing up and getting acclamated to maintaining her balance.  She has also had challenges with sleeping.  She is sleeping more during the day, and not as much at night.   She has had a CVA in 2018 and has residual numbness in her right hand and left foot dragging (? Numbness).  She denies any new peripheral neuropathy in the fingertips or toes.    [name] [name] denies any fever, chills, chest pain, palpitations, cough, shortness of breath, nausea, vomiting, bowel/bladder changes.  A detailed ROS was otherwise non contributory.    HISTORY OF CURRENT ILLNESS: From the original intake note:  [name] [name] presented with right breast  discoloration and redness. The right breast also had an area of hardness and nipple retraction.  She did not bring this to medical attention but her daughter did mention it when the patient had neurologic follow-up on 08/10/2018.  At that time the nurse practitioner, [name], noted right nipple inversion, hard breast, and red non-ulcerated lesions on the breast.  The patient then underwent bilateral diagnostic mammography with tomography and right breast ultrasonography at [facility] on 08/11/2018 (this is her first ever mammogram) showing: breast density category B. There was a round 2.5 cm mass at the 1 o'clock upper inner quadrant of the right breast.  By palpation this measured approximately 10 cm.  Sonographically the irregular hypoechoic mass measured 2.7 cm. There was also a hypoechoic mass in the right breast at 9 o'clock upper outer quadrant measuring 3.8 cm. There was an abnormal right axillary lymph node with cortical thickening.   Accordingly on 08/19/2018 she proceeded to biopsy of the right breast areas in question. The pathology from this procedure showed [id number]): At the 1 o'clock: invasive lobular carcinoma, grade II. Prognostic indicators significant for: estrogen receptor, 60% positive with moderate staining intensity and progesterone receptor, 0% negative. Proliferation marker Ki67 at 20%. HER2 negative with an immunohistochemistry of (1+).  At the 9 o'clock: invasive lobular carcinoma, grade II. Prognostic indicators significant for: estrogen receptor, 90% positive with strong staining intensity and progesterone receptor, 0% negative. Proliferation marker Ki67 at 30%. HER2 negative with an immunohistochemistry of (1+).  The patient's subsequent history is as detailed below.   PAST MEDICAL HISTORY: Past Medical History:  Diagnosis Date  . Anxiety   . Cancer ([place])  breast cancer- right  . Carotid stenosis   . Depression    situational  . Dyspnea    with much ambulation   . GERD (gastroesophageal reflux disease)   . Hypertension   . Stroke ([place]) 04/01/2017   a little weakness right side leg and hand, a little memry loss    PAST SURGICAL HISTORY: Past Surgical History:  Procedure Laterality Date  . ABDOMINAL HYSTERECTOMY    . ENDARTERECTOMY Left 04/08/2017   Procedure: ENDARTERECTOMY CAROTID;  Surgeon: Waynetta Sandy, MD;  Location: [place];  Service: Vascular;  Laterality: Left;  . PATCH ANGIOPLASTY Left 04/08/2017   Procedure: PATCH ANGIOPLASTY Left Carotid;  Surgeon: Waynetta Sandy, MD;  Location: [place];  Service: Vascular;  Laterality: Left;  . PORTACATH PLACEMENT Left 09/07/2018   Procedure: INSERTION PORT-A-CATH LEFT INTERNAL JUGULAR;  Surgeon: [name], MD;  Location: [place];  Service: General;  Laterality: Left;  total hysterectomy with bilateral salpingo-oophorectomy in her 13's no hormone replacement   FAMILY HISTORY: Family History  Problem Relation Age of Onset  . Cancer Mother        ovarian   The patient's father is alive at age 45. The patient's mother died at age 36 due to ovarian cancer. The patient had 3 brothers and 3 sisters. She denies a history of breast, pancreatic, colon, or other cancers in the family.    GYNECOLOGIC HISTORY:  No LMP recorded. Patient has had a hysterectomy. Menarche: Age at first live birth: 73 years old She is GX P1.  She is status post total hysterectomy with BSO in her early 63's. She did not use HRT.    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. She is divorced. At home is just herself, but her two grandsons regularly come to visit and stay with her. The patient's daughter, [name] [name] works in administration for [employer]. The patient's 30 y.o. grandson is a Freight forwarder for [employer]. The patient's 42 y.o. grandson works in [employer].    ADVANCED DIRECTIVES: Not in place.  At the 08/26/2018 visit the patient was given the appropriate documents  to complete and notarized at her discretion.   HEALTH MAINTENANCE: Social History   Tobacco Use  . Smoking status: Never Smoker  . Smokeless tobacco: Never Used  Substance Use Topics  . Alcohol use: No  . Drug use: No     Colonoscopy: Never  PAP: Status post hysterectomy  Bone density: Never   No Known Allergies  Current Outpatient Medications  Medication Sig Dispense Refill  . amLODipine (NORVASC) 10 MG tablet Take 1 tablet (10 mg total) by mouth daily. PATIENT MUST HAVE OFFICE VISIT AND LABS PRIOR TO ANY FURTHER REFILLS 15 tablet 0  . atorvastatin (LIPITOR) 80 MG tablet Take 1 tablet (80 mg total) by mouth daily at 6 PM. 30 tablet 0  . clopidogrel (PLAVIX) 75 MG tablet TAKE 1 TABLET BY MOUTH EVERY DAY 30 tablet 1  . lidocaine-prilocaine (EMLA) cream Apply to affected area once 30 g 3  . lisinopril-hydrochlorothiazide (PRINZIDE,ZESTORETIC) 20-12.5 MG tablet Take 1 tablet by mouth daily.  11  . loratadine (CLARITIN) 10 MG tablet Take 10 mg by mouth daily as needed for allergies.    . potassium chloride SA (K-DUR,KLOR-CON) 20 MEQ tablet Take 1 tablet (20 mEq total) by mouth daily. 30 tablet 0  . prochlorperazine (COMPAZINE) 10 MG tablet Take 1 tablet (10 mg total) by mouth 3 (three) times daily before  meals. 30 tablet 1   No current facility-administered medications for this visit.    Facility-Administered Medications Ordered in Other Visits  Medication Dose Route Frequency Provider Last Rate Last Dose  . heparin lock flush 100 unit/mL  500 Units Intracatheter Once PRN Magrinat, [name], MD      . sodium chloride flush (NS) 0.9 % injection 10 mL  10 mL Intracatheter PRN Magrinat, [name], MD        OBJECTIVE:   Vitals:   09/08/19 0921  BP: (!) 156/53  Pulse: (!) 103  Resp: 18  Temp: 98.7 F (37.1 C)  SpO2: 99%   Wt Readings from Last 3 Encounters:  09/08/19 200 lb 3.2 oz (90.8 kg)  08/25/19 197 lb 6.4 oz (89.5 kg)  08/18/19 197 lb 6.4 oz (89.5 kg)   Body mass  index is 32.31 kg/m.    ECOG FS:2 - Symptomatic, <50% confined to bed GENERAL: Patient is a well appearing older woman in no acute distress HEENT:  Sclerae anicteric.  Oropharynx clear and moist. No ulcerations or evidence of oropharyngeal candidiasis. Neck is supple.  NODES:  No cervical, supraclavicular, or axillary lymphadenopathy palpated.  BREAST EXAM:  Right breast is stable (from 10/1 picture), no clinical progression noted LUNGS:  Clear to auscultation bilaterally.  No wheezes or rhonchi. HEART:  Regular rate and rhythm. No murmur appreciated. ABDOMEN:  Soft, nontender.  Positive, normoactive bowel sounds. No organomegaly palpated. MSK:  No focal spinal tenderness to palpation. Full range of motion bilaterally in the upper extremities. EXTREMITIES:  No peripheral edema.   SKIN:  Clear with no obvious rashes or skin changes. No nail dyscrasia. NEURO:  Nonfocal. Well oriented.  Appropriate affect.     Right breast 08/12/2019    Right Breast 04/07/2019     Right Breast 01/08/2019    Right Breast 08/26/2018    LAB RESULTS:  CMP     Component Value Date/Time   NA 142 08/25/2019 0945   K 3.7 08/25/2019 0945   CL 105 08/25/2019 0945   CO2 27 08/25/2019 0945   GLUCOSE 115 (H) 08/25/2019 0945   BUN 11 08/25/2019 0945   CREATININE 0.84 08/25/2019 0945   CREATININE 1.11 (H) 11/03/2018 1017   CALCIUM 9.2 08/25/2019 0945   PROT 7.5 08/25/2019 0945   ALBUMIN 3.8 08/25/2019 0945   AST 17 08/25/2019 0945   AST 32 11/03/2018 1017   ALT 21 08/25/2019 0945   ALT 48 (H) 11/03/2018 1017   ALKPHOS 93 08/25/2019 0945   BILITOT 0.4 08/25/2019 0945   BILITOT 0.5 11/03/2018 1017   GFRNONAA >60 08/25/2019 0945   GFRNONAA 50 (L) 11/03/2018 1017   GFRAA >60 08/25/2019 0945   GFRAA 58 (L) 11/03/2018 1017    No results found for: TOTALPROTELP, ALBUMINELP, A1GS, A2GS, BETS, BETA2SER, GAMS, MSPIKE, SPEI  No results found for: KPAFRELGTCHN, LAMBDASER, KAPLAMBRATIO  Lab  Results  Component Value Date   WBC 4.2 08/25/2019   NEUTROABS 2.6 08/25/2019   HGB 10.7 (L) 08/25/2019   HCT 33.7 (L) 08/25/2019   MCV 93.4 08/25/2019   PLT 273 08/25/2019    '@LASTCHEMISTRY'$ @  No results found for: LABCA2  No components found for: [id number]  No results for input(s): INR in the last 168 hours.  No results found for: LABCA2  No results found for: QHU765  No results found for: CAN125  No results found for: YYT035  Lab Results  Component Value Date   CA2729 78.6 (H) 08/18/2019  No components found for: HGQUANT  No results found for: CEA1 / No results found for: CEA1   No results found for: AFPTUMOR  No results found for: CHROMOGRNA  No results found for: PSA1  No visits with results within 3 Day(s) from this visit.  Latest known visit with results is:  Appointment on 08/25/2019  Component Date Value Ref Range Status  . Sodium 08/25/2019 142  135 - 145 mmol/L Final  . Potassium 08/25/2019 3.7  3.5 - 5.1 mmol/L Final  . Chloride 08/25/2019 105  98 - 111 mmol/L Final  . CO2 08/25/2019 27  22 - 32 mmol/L Final  . Glucose, Bld 08/25/2019 115* 70 - 99 mg/dL Final  . BUN 08/25/2019 11  8 - 23 mg/dL Final  . Creatinine, Ser 08/25/2019 0.84  0.44 - 1.00 mg/dL Final  . Calcium 08/25/2019 9.2  8.9 - 10.3 mg/dL Final  . Total Protein 08/25/2019 7.5  6.5 - 8.1 g/dL Final  . Albumin 08/25/2019 3.8  3.5 - 5.0 g/dL Final  . AST 08/25/2019 17  15 - 41 U/L Final  . ALT 08/25/2019 21  0 - 44 U/L Final  . Alkaline Phosphatase 08/25/2019 93  38 - 126 U/L Final  . Total Bilirubin 08/25/2019 0.4  0.3 - 1.2 mg/dL Final  . GFR calc non Af Amer 08/25/2019 >60  >60 mL/min Final  . GFR calc Af Amer 08/25/2019 >60  >60 mL/min Final  . Anion gap 08/25/2019 10  5 - 15 Final   Performed at [facility], [place] [address] [place], [place] [address]  . WBC 08/25/2019 4.2  4.0 - 10.5 K/uL Final  . RBC 08/25/2019 3.61* 3.87 - 5.11 MIL/uL Final  .  Hemoglobin 08/25/2019 10.7* 12.0 - 15.0 g/dL Final  . HCT 08/25/2019 33.7* 36.0 - 46.0 % Final  . MCV 08/25/2019 93.4  80.0 - 100.0 fL Final  . MCH 08/25/2019 29.6  26.0 - 34.0 pg Final  . MCHC 08/25/2019 31.8  30.0 - 36.0 g/dL Final  . RDW 08/25/2019 14.2  11.5 - 15.5 % Final  . Platelets 08/25/2019 273  150 - 400 K/uL Final  . nRBC 08/25/2019 0.5* 0.0 - 0.2 % Final  . Neutrophils Relative % 08/25/2019 61  % Final  . Neutro Abs 08/25/2019 2.6  1.7 - 7.7 K/uL Final  . Lymphocytes Relative 08/25/2019 21  % Final  . Lymphs Abs 08/25/2019 0.9  0.7 - 4.0 K/uL Final  . Monocytes Relative 08/25/2019 10  % Final  . Monocytes Absolute 08/25/2019 0.4  0.1 - 1.0 K/uL Final  . Eosinophils Relative 08/25/2019 3  % Final  . Eosinophils Absolute 08/25/2019 0.1  0.0 - 0.5 K/uL Final  . Basophils Relative 08/25/2019 2  % Final  . Basophils Absolute 08/25/2019 0.1  0.0 - 0.1 K/uL Final  . Immature Granulocytes 08/25/2019 3  % Final  . Abs Immature Granulocytes 08/25/2019 0.11* 0.00 - 0.07 K/uL Final   Performed at [facility], [place] [address] [place], [place] [address]    (this displays the last labs from the last 3 days)  No results found for: TOTALPROTELP, ALBUMINELP, A1GS, A2GS, BETS, BETA2SER, GAMS, MSPIKE, SPEI (this displays SPEP labs)  No results found for: KPAFRELGTCHN, LAMBDASER, KAPLAMBRATIO (kappa/lambda light chains)  No results found for: HGBA, HGBA2QUANT, HGBFQUANT, HGBSQUAN (Hemoglobinopathy evaluation)   No results found for: LDH  No results found for: IRON, TIBC, IRONPCTSAT (Iron and TIBC)  No results found  for: FERRITIN  Urinalysis    Component Value Date/Time   COLORURINE AMBER (A) 04/01/2017 0920   APPEARANCEUR CLOUDY (A) 04/01/2017 0920   LABSPEC 1.021 04/01/2017 0920   PHURINE 5.0 04/01/2017 0920   GLUCOSEU NEGATIVE 04/01/2017 0920   HGBUR MODERATE (A) 04/01/2017 0920   BILIRUBINUR NEGATIVE 04/01/2017 0920   KETONESUR 80 (A) 04/01/2017  0920   PROTEINUR NEGATIVE 04/01/2017 0920   NITRITE NEGATIVE 04/01/2017 0920   LEUKOCYTESUR LARGE (A) 04/01/2017 0920     STUDIES: No results found.  ELIGIBLE FOR AVAILABLE RESEARCH PROTOCOL: no   ASSESSMENT: 73 y.o. [place], [place] woman status post right breast biopsy of overlapping sites on 08/19/2018 for a clinical T4 N1, stage IIIB invasive lobular carcinoma, estrogen receptor positive, progesterone receptor negative, HER-2 not amplified, with an MIB-1 of 20%.  (a) CT chest/abd/pelvis 09/02/2018 and bone scan 09/10/2018 show no evidence of stage IV disease  (b) breast MRI 09/14/2018 confirms a multicentric 12.6 cm right breast mass infiltrating pectpralis but w/o associated adenopathy  (1) genetics testing to be scheduled  (2) neoadjuvant chemotherapy consisting of cyclophosphamide, methotrexate and fluorouracil every 21 days x 8, started 09/16/2018, last dose 12/18/2018  (a) CMF chemotherapy stopped after 4 cycles, with no evidence of response by MRI NOV 2019  (5) anastrozole started February 2019  (a) bone density to be obtained  (b) palbociclib added 01/19/2019 at 125 mg daily, 21/7  (c) palbociclib dose decreased to 100 mg daily 21 on 7 off, starting with second cycle, April 2020  (d) breast MRI 05/05/2019 shows slight improvement, no evidence of progression, however the tumor is not yet resectable.  Chest CT scan same day shows no evidence of metastatic disease  (e) anastrozole and palbociclib discontinued 08/12/2019 with no significant response  (6) foundation 1/PD-L1 testing:  (a) PD-L1 requested 12/18/2018--insufficient tissue  (b) foundation one testing shows the microsatellite status to be stable, and the mutational burden to be 16/Mb  (c) there are potentially actionable mutations in AKT3, TSC1, KRAS  MAPK1 and RB1  (7) to start paclitaxel 08/18/2019, to be given days 1 and 8 of each 21-day cycle  (a) changed to day 1 and day 8 every 28 days per patient preference   (8) definitive surgery pending  (9) adjuvant radiation to follow   PLAN: Shataya is doing moderately well today.  She is feeling better than she did last week, and her breast cancer in her right breast remains stable.  After discussion with [name] [name], her daughter, and [name] [name], we decided that she will skip treatment this week, and go to days 1 and 8 on a 28 day cycle.    We are monitoring [name] [name] closely for peripheral neuropathy.  The neuropathy in her right hand and foot is no worse (baseline neuropathy from CVA in 2018), and she has not developed neuropathy in her left fingertips or toes.    We discussed fluid intake, healthy diet, and working on her activity level.  I encouraged her to take [place] short walks, and do some brief leg exercises to keep her stamina, and prevent deconditioning, which can happen insidiously while undergoing treatment.    [name] [name] will return in one week for labs, f/u, and cycle 2 of chemotherapy.  I reviewed the plan change with Dr. Jana Hakim who is in agreement.  She was recommended to continue with the appropriate pandemic precautions. She knows to call for any questions that may arise between now and her next appointment.  We are happy to see her sooner  if needed.  A total of (30) minutes of face-to-face time was spent with this patient with greater than 50% of that time in counseling and care-coordination.   Wilber Bihari, NP  09/08/19 9:44 AM Medical Oncology and Hematology [facility] [place], [place] [address] Tel. [phone]    Fax. [phone]

## 2019-09-09 ENCOUNTER — Telehealth: Payer: Self-pay | Admitting: Oncology

## 2019-09-09 NOTE — Telephone Encounter (Signed)
I left a message regarding schedule  

## 2019-09-10 ENCOUNTER — Telehealth: Payer: Self-pay

## 2019-09-10 NOTE — Telephone Encounter (Signed)
Spoke with dtr by phone and reviewed upcoming appt dates / times.

## 2019-09-14 NOTE — Progress Notes (Signed)
[place]  [phone] [phone]    ID: [name] [name] DOB: [birth year]  MR#: [id number]  MPN#:[id number]  Patient Care Team: [name] Carol, MD as PCP - General (Internal Medicine) [name] Sandy, MD as Consulting Physician (Vascular Surgery) [name] Berthold, NP as Nurse Practitioner (Cardiology) [name] Staggers, MD as Consulting Physician (Physical Medicine and Rehabilitation) [name], [name] Leyden, NP as Nurse Practitioner (Vascular Surgery) [name] Roys, PsyD as Consulting Physician (Psychology) [name], [name] Dad, MD as Consulting Physician (Oncology) [name] Kussmaul, MD as Consulting Physician (General Surgery) [name] Gibson, MD as Attending Physician (Radiation Oncology) OTHER MD:   CHIEF COMPLAINT: Inflammatory breast cancer  CURRENT TREATMENT: Neoadjuvant chemotherapy   INTERVAL HISTORY: [name] [name] returns today for follow-up and treatment of her inflammatory breast cancer.  She is accompanied by her daughter [name] [name].    She has been started on paclitaxel.  Her first dose was 08/18/2019 and then she received a day 8 dose on 08/25/2019.  She was then scheduled for resuming treatment on 09/08/2019 but she was feeling a little under the weather and that treatment was postponed. Today is day 1 cycle 2.  Since her last visit, she has not undergone any additional studies. Her most recent studies include a bilateral breast MRI on 05/05/2019 and chest CT on 05/10/2019.   REVIEW OF SYSTEMS: [name] [name] tells me she is tolerating treatment well.  She has mild fatigue which is really not any different from her baseline.  At home she does a little under does a little folding of clothes, and heats up her lunch which her daughter get ready for her.  She had a couple of episodes of mild diarrhea.  She does not feel she needs Imodium for that.  She has had absolutely no peripheral neuropathy.  She thinks the breast may be a little better.  A detailed  review of systems was otherwise stable   HISTORY OF CURRENT ILLNESS: From the original intake note:  [name] [name] presented with right breast discoloration and redness. The right breast also had an area of hardness and nipple retraction.  She did not bring this to medical attention but her daughter did mention it when the patient had neurologic follow-up on 08/10/2018.  At that time the nurse practitioner, [name] [name], noted right nipple inversion, hard breast, and red non-ulcerated lesions on the breast.  The patient then underwent bilateral diagnostic mammography with tomography and right breast ultrasonography at [facility] on 08/11/2018 (this is her first ever mammogram) showing: breast density category B. There was a round 2.5 cm mass at the 1 o'clock upper inner quadrant of the right breast.  By palpation this measured approximately 10 cm.  Sonographically the irregular hypoechoic mass measured 2.7 cm. There was also a hypoechoic mass in the right breast at 9 o'clock upper outer quadrant measuring 3.8 cm. There was an abnormal right axillary lymph node with cortical thickening.   Accordingly on 08/19/2018 she proceeded to biopsy of the right breast areas in question. The pathology from this procedure showed [id number]): At the 1 o'clock: invasive lobular carcinoma, grade II. Prognostic indicators significant for: estrogen receptor, 60% positive with moderate staining intensity and progesterone receptor, 0% negative. Proliferation marker Ki67 at 20%. HER2 negative with an immunohistochemistry of (1+).  At the 9 o'clock: invasive lobular carcinoma, grade II. Prognostic indicators significant for: estrogen receptor, 90% positive with strong staining intensity and progesterone receptor, 0% negative. Proliferation marker Ki67 at 30%. HER2 negative with an immunohistochemistry of (1+).  The patient's subsequent history is as detailed below.   PAST MEDICAL HISTORY: Past Medical History:  Diagnosis  Date  . Anxiety   . Cancer (HCC)    breast cancer- right  . Carotid stenosis   . Depression    situational  . Dyspnea    with much ambulation  . GERD (gastroesophageal reflux disease)   . Hypertension   . Stroke (HCC) 04/01/2017   a little weakness right side leg and hand, a little memry loss    PAST SURGICAL HISTORY: Past Surgical History:  Procedure Laterality Date  . ABDOMINAL HYSTERECTOMY    . ENDARTERECTOMY Left 04/08/2017   Procedure: ENDARTERECTOMY CAROTID;  Surgeon: Cain, Brandon Christopher, MD;  Location: [facility] OR;  Service: Vascular;  Laterality: Left;  . PATCH ANGIOPLASTY Left 04/08/2017   Procedure: PATCH ANGIOPLASTY Left Carotid;  Surgeon: Cain, Brandon Christopher, MD;  Location: [facility] OR;  Service: Vascular;  Laterality: Left;  . PORTACATH PLACEMENT Left 09/07/2018   Procedure: INSERTION PORT-A-CATH LEFT INTERNAL JUGULAR;  Surgeon: Toth, Paul III, MD;  Location: [facility] OR;  Service: General;  Laterality: Left;  total hysterectomy with bilateral salpingo-oophorectomy in her 30's no hormone replacement   FAMILY HISTORY: Family History  Problem Relation Age of Onset  . Cancer Mother        ovarian   The patient's father is alive at age 89. The patient's mother died at age 56 due to ovarian cancer. The patient had 3 brothers and 3 sisters. She denies a history of breast, pancreatic, colon, or other cancers in the family.    GYNECOLOGIC HISTORY:  No LMP recorded. Patient has had a hysterectomy. Menarche: Age at first live birth: 73 years old She is GX P1.  She is status post total hysterectomy with BSO in her early 30's. She did not use HRT.    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 She is divorced. At home is just herself, but her two grandsons regularly come to visit and stay with her. The patient's daughter, [name] [name] works in administration for [employer]. The patient's 25 y.o. grandson is a manager for [employer]. The patient's  20 y.o. grandson works in fast food.    ADVANCED DIRECTIVES: Not in place.  At the 08/26/2018 visit the patient was given the appropriate documents to complete and notarized at her discretion.   HEALTH MAINTENANCE: Social History   Tobacco Use  . Smoking status: Never Smoker  . Smokeless tobacco: Never Used  Substance Use Topics  . Alcohol use: No  . Drug use: No     Colonoscopy: Never  PAP: Status post hysterectomy  Bone density: Never   No Known Allergies  Current Outpatient Medications  Medication Sig Dispense Refill  . amLODipine (NORVASC) 10 MG tablet Take 1 tablet (10 mg total) by mouth daily. PATIENT MUST HAVE OFFICE VISIT AND LABS PRIOR TO ANY FURTHER REFILLS 15 tablet 0  . atorvastatin (LIPITOR) 80 MG tablet Take 1 tablet (80 mg total) by mouth daily at 6 PM. 30 tablet 0  . clopidogrel (PLAVIX) 75 MG tablet TAKE 1 TABLET BY MOUTH EVERY DAY 30 tablet 1  . lidocaine-prilocaine (EMLA) cream Apply to affected area once 30 g 3  . lisinopril-hydrochlorothiazide (PRINZIDE,ZESTORETIC) 20-12.5 MG tablet Take 1 tablet by mouth daily.  11  . loratadine (CLARITIN) 10 MG tablet Take 10 mg by mouth daily as needed for allergies.    . potassium chloride SA (K-DUR,KLOR-CON) 20 MEQ tablet Take 1   tablet (20 mEq total) by mouth daily. 30 tablet 0  . prochlorperazine (COMPAZINE) 10 MG tablet Take 1 tablet (10 mg total) by mouth 3 (three) times daily before meals. 30 tablet 1   No current facility-administered medications for this visit.    Facility-Administered Medications Ordered in Other Visits  Medication Dose Route Frequency Provider Last Rate Last Dose  . heparin lock flush 100 unit/mL  500 Units Intracatheter Once PRN [name], Gustav C, MD      . sodium chloride flush (NS) 0.9 % injection 10 mL  10 mL Intracatheter PRN [name], Gustav C, MD        OBJECTIVE: Older African-American woman in no acute distress  Vitals:   09/15/19 0918  BP: (!) 152/61  Pulse: 100  Resp: 18   Temp: 99.1 F (37.3 C)  SpO2: 96%   Wt Readings from Last 3 Encounters:  09/15/19 200 lb 14.4 oz (91.1 kg)  09/08/19 200 lb 3.2 oz (90.8 kg)  08/25/19 197 lb 6.4 oz (89.5 kg)   Body mass index is 32.43 kg/m.    ECOG FS:2 - Symptomatic, <50% confined to bed  Sclerae unicteric, EOMs intact Wearing a mask No cervical or supraclavicular adenopathy Lungs no rales or rhonchi Heart regular rate and rhythm Abd soft, nontender, positive bowel sounds MSK no focal spinal tenderness, no upper extremity lymphedema Neuro: nonfocal, well oriented, appropriate affect Breasts: The right breast appears essentially unchanged as compared to 08/12/2019.  The left breast is unremarkable.  Both axillae are benign.   Right breast 08/12/2019    Right Breast 04/07/2019     Right Breast 01/08/2019    Right Breast 08/26/2018    LAB RESULTS:  CMP     Component Value Date/Time   NA 143 09/15/2019 0859   K 3.7 09/15/2019 0859   CL 106 09/15/2019 0859   CO2 26 09/15/2019 0859   GLUCOSE 113 (H) 09/15/2019 0859   BUN 15 09/15/2019 0859   CREATININE 0.99 09/15/2019 0859   CREATININE 1.11 (H) 11/03/2018 1017   CALCIUM 9.2 09/15/2019 0859   PROT 7.4 09/15/2019 0859   ALBUMIN 3.9 09/15/2019 0859   AST 18 09/15/2019 0859   AST 32 11/03/2018 1017   ALT 28 09/15/2019 0859   ALT 48 (H) 11/03/2018 1017   ALKPHOS 97 09/15/2019 0859   BILITOT 0.4 09/15/2019 0859   BILITOT 0.5 11/03/2018 1017   GFRNONAA 57 (L) 09/15/2019 0859   GFRNONAA 50 (L) 11/03/2018 1017   GFRAA >60 09/15/2019 0859   GFRAA 58 (L) 11/03/2018 1017    No results found for: TOTALPROTELP, ALBUMINELP, A1GS, A2GS, BETS, BETA2SER, GAMS, MSPIKE, SPEI  No results found for: KPAFRELGTCHN, LAMBDASER, KAPLAMBRATIO  Lab Results  Component Value Date   WBC 6.9 09/15/2019   NEUTROABS 4.2 09/15/2019   HGB 10.6 (L) 09/15/2019   HCT 34.9 (L) 09/15/2019   MCV 92.1 09/15/2019   PLT 255 09/15/2019    @LASTCHEMISTRY@  No  results found for: LABCA2  No components found for: LABCAN125  No results for input(s): INR in the last 168 hours.  No results found for: LABCA2  No results found for: CAN199  No results found for: CAN125  No results found for: CAN153  Lab Results  Component Value Date   CA2729 78.6 (H) 08/18/2019    No components found for: HGQUANT  No results found for: CEA1 / No results found for: CEA1   No results found for: AFPTUMOR  No results found for: CHROMOGRNA  No   results found for: PSA1  Appointment on 09/15/2019  Component Date Value Ref Range Status  . Sodium 09/15/2019 143  135 - 145 mmol/L Final  . Potassium 09/15/2019 3.7  3.5 - 5.1 mmol/L Final  . Chloride 09/15/2019 106  98 - 111 mmol/L Final  . CO2 09/15/2019 26  22 - 32 mmol/L Final  . Glucose, Bld 09/15/2019 113* 70 - 99 mg/dL Final  . BUN 09/15/2019 15  8 - 23 mg/dL Final  . Creatinine, Ser 09/15/2019 0.99  0.44 - 1.00 mg/dL Final  . Calcium 09/15/2019 9.2  8.9 - 10.3 mg/dL Final  . Total Protein 09/15/2019 7.4  6.5 - 8.1 g/dL Final  . Albumin 09/15/2019 3.9  3.5 - 5.0 g/dL Final  . AST 09/15/2019 18  15 - 41 U/L Final  . ALT 09/15/2019 28  0 - 44 U/L Final  . Alkaline Phosphatase 09/15/2019 97  38 - 126 U/L Final  . Total Bilirubin 09/15/2019 0.4  0.3 - 1.2 mg/dL Final  . GFR calc non Af Amer 09/15/2019 57* >60 mL/min Final  . GFR calc Af Amer 09/15/2019 >60  >60 mL/min Final  . Anion gap 09/15/2019 11  5 - 15 Final   Performed at [facility], [address] [place], [place] [address]  . WBC 09/15/2019 6.9  4.0 - 10.5 K/uL Final  . RBC 09/15/2019 3.79* 3.87 - 5.11 MIL/uL Final  . Hemoglobin 09/15/2019 10.6* 12.0 - 15.0 g/dL Final  . HCT 09/15/2019 34.9* 36.0 - 46.0 % Final  . MCV 09/15/2019 92.1  80.0 - 100.0 fL Final  . MCH 09/15/2019 28.0  26.0 - 34.0 pg Final  . MCHC 09/15/2019 30.4  30.0 - 36.0 g/dL Final  . RDW 09/15/2019 15.1  11.5 - 15.5 % Final  . Platelets 09/15/2019  255  150 - 400 K/uL Final  . nRBC 09/15/2019 0.3* 0.0 - 0.2 % Final  . Neutrophils Relative % 09/15/2019 61  % Final  . Neutro Abs 09/15/2019 4.2  1.7 - 7.7 K/uL Final  . Lymphocytes Relative 09/15/2019 19  % Final  . Lymphs Abs 09/15/2019 1.3  0.7 - 4.0 K/uL Final  . Monocytes Relative 09/15/2019 15  % Final  . Monocytes Absolute 09/15/2019 1.0  0.1 - 1.0 K/uL Final  . Eosinophils Relative 09/15/2019 2  % Final  . Eosinophils Absolute 09/15/2019 0.1  0.0 - 0.5 K/uL Final  . Basophils Relative 09/15/2019 1  % Final  . Basophils Absolute 09/15/2019 0.1  0.0 - 0.1 K/uL Final  . Immature Granulocytes 09/15/2019 2  % Final  . Abs Immature Granulocytes 09/15/2019 0.16* 0.00 - 0.07 K/uL Final   Performed at [facility], [address] [place], [place] [address]    (this displays the last labs from the last 3 days)  No results found for: TOTALPROTELP, ALBUMINELP, A1GS, A2GS, BETS, BETA2SER, GAMS, MSPIKE, SPEI (this displays SPEP labs)  No results found for: KPAFRELGTCHN, LAMBDASER, KAPLAMBRATIO (kappa/lambda light chains)  No results found for: HGBA, HGBA2QUANT, HGBFQUANT, HGBSQUAN (Hemoglobinopathy evaluation)   No results found for: LDH  No results found for: IRON, TIBC, IRONPCTSAT (Iron and TIBC)  No results found for: FERRITIN  Urinalysis    Component Value Date/Time   COLORURINE AMBER (A) 04/01/2017 0920   APPEARANCEUR CLOUDY (A) 04/01/2017 0920   LABSPEC 1.021 04/01/2017 0920   PHURINE 5.0 04/01/2017 0920   GLUCOSEU NEGATIVE 04/01/2017 0920   HGBUR MODERATE (A) 04/01/2017 0920     BILIRUBINUR NEGATIVE 04/01/2017 0920   KETONESUR 80 (A) 04/01/2017 0920   PROTEINUR NEGATIVE 04/01/2017 0920   NITRITE NEGATIVE 04/01/2017 0920   LEUKOCYTESUR LARGE (A) 04/01/2017 0920     STUDIES: No results found.  ELIGIBLE FOR AVAILABLE RESEARCH PROTOCOL: no   ASSESSMENT: 73 y.o. [place], [place] woman status post right breast biopsy of overlapping sites on  08/19/2018 for a clinical T4 N1, stage IIIB invasive lobular carcinoma, estrogen receptor positive, progesterone receptor negative, HER-2 not amplified, with an MIB-1 of 20%.  (a) CT chest/abd/pelvis 09/02/2018 and bone scan 09/10/2018 show no evidence of stage IV disease  (b) breast MRI 09/14/2018 confirms a multicentric 12.6 cm right breast mass infiltrating pectpralis but w/o associated adenopathy  (1) genetics testing to be scheduled  (2) neoadjuvant chemotherapy consisting of cyclophosphamide, methotrexate and fluorouracil every 21 days x 8, started 09/16/2018, last dose 12/18/2018  (a) CM[facility] chemotherapy stopped after 4 cycles, with no evidence of response by MRI NOV 2019  (5) anastrozole started February 2019  (a) bone density to be obtained  (b) palbociclib added 01/19/2019 at 125 mg daily, 21/7  (c) palbociclib dose decreased to 100 mg daily 21 on 7 off, starting with second cycle, April 2020  (d) breast MRI 05/05/2019 shows slight improvement, no evidence of progression, however the tumor is not yet resectable.  Chest CT scan same day shows no evidence of metastatic disease  (e) anastrozole and palbociclib discontinued 08/12/2019 with no significant response  (6) foundation 1/PD-L1 testing:  (a) PD-L1 requested 12/18/2018--insufficient tissue  (b) foundation one testing shows the microsatellite status to be stable, and the mutational burden to be 16/Mb  (c) there are potentially actionable mutations in AKT3, TSC1, KRAS  MAPK1 and RB1  (7) to start paclitaxel 08/18/2019, to be given days 1 and 8 of each 21-28-day cycle  (a) changed to day 1 and day 8 every 28 days per patient preference  (8) definitive surgery pending  (9) adjuvant radiation to follow   PLAN: [name] [name] is tolerating treatment well and we are proceeding to the day 1 of cycle 2 today.  She will return for day 8 next week and then because her next treatment would fall on Thanksgiving day or Thanksgiving week she will  have her cycle 3 beginning 10/13/2019.  We will see her again that day to assess for any evidence of response or significant side effects.  If she tolerates treatment well but does not have a significant improvement by the end of 3 cycles, 6 doses of the current treatment we will have to consider treatment alternatives  She knows to call for any other issues that may develop before the next visit.   [name] Dad. Magrinat, MD  09/15/19 9:38 AM Medical Oncology and Hematology [facility] [place], [place] [address] Tel. [phone]    Fax. [phone]   I, Wilburn Mylar, am acting as scribe for Dr. [name]. [name].  I, Lurline Del MD, have reviewed the above documentation for accuracy and completeness, and I agree with the above.

## 2019-09-15 ENCOUNTER — Inpatient Hospital Stay: Payer: Medicare Other | Attending: Oncology

## 2019-09-15 ENCOUNTER — Other Ambulatory Visit: Payer: Self-pay

## 2019-09-15 ENCOUNTER — Inpatient Hospital Stay: Payer: Medicare Other

## 2019-09-15 ENCOUNTER — Inpatient Hospital Stay (HOSPITAL_BASED_OUTPATIENT_CLINIC_OR_DEPARTMENT_OTHER): Payer: Medicare Other | Admitting: Oncology

## 2019-09-15 VITALS — BP 152/61 | HR 100 | Temp 99.1°F | Resp 18 | Ht 66.0 in | Wt 200.9 lb

## 2019-09-15 DIAGNOSIS — C50811 Malignant neoplasm of overlapping sites of right female breast: Secondary | ICD-10-CM | POA: Insufficient documentation

## 2019-09-15 DIAGNOSIS — Z5111 Encounter for antineoplastic chemotherapy: Secondary | ICD-10-CM | POA: Diagnosis not present

## 2019-09-15 DIAGNOSIS — C50911 Malignant neoplasm of unspecified site of right female breast: Secondary | ICD-10-CM

## 2019-09-15 DIAGNOSIS — Z170 Estrogen receptor positive status [ER+]: Secondary | ICD-10-CM

## 2019-09-15 DIAGNOSIS — Z95828 Presence of other vascular implants and grafts: Secondary | ICD-10-CM

## 2019-09-15 DIAGNOSIS — I69359 Hemiplegia and hemiparesis following cerebral infarction affecting unspecified side: Secondary | ICD-10-CM

## 2019-09-15 DIAGNOSIS — I63132 Cerebral infarction due to embolism of left carotid artery: Secondary | ICD-10-CM | POA: Diagnosis not present

## 2019-09-15 LAB — CBC WITH DIFFERENTIAL/PLATELET
Abs Immature Granulocytes: 0.16 10*3/uL — ABNORMAL HIGH (ref 0.00–0.07)
Basophils Absolute: 0.1 10*3/uL (ref 0.0–0.1)
Basophils Relative: 1 %
Eosinophils Absolute: 0.1 10*3/uL (ref 0.0–0.5)
Eosinophils Relative: 2 %
HCT: 34.9 % — ABNORMAL LOW (ref 36.0–46.0)
Hemoglobin: 10.6 g/dL — ABNORMAL LOW (ref 12.0–15.0)
Immature Granulocytes: 2 %
Lymphocytes Relative: 19 %
Lymphs Abs: 1.3 10*3/uL (ref 0.7–4.0)
MCH: 28 pg (ref 26.0–34.0)
MCHC: 30.4 g/dL (ref 30.0–36.0)
MCV: 92.1 fL (ref 80.0–100.0)
Monocytes Absolute: 1 10*3/uL (ref 0.1–1.0)
Monocytes Relative: 15 %
Neutro Abs: 4.2 10*3/uL (ref 1.7–7.7)
Neutrophils Relative %: 61 %
Platelets: 255 10*3/uL (ref 150–400)
RBC: 3.79 MIL/uL — ABNORMAL LOW (ref 3.87–5.11)
RDW: 15.1 % (ref 11.5–15.5)
WBC: 6.9 10*3/uL (ref 4.0–10.5)
nRBC: 0.3 % — ABNORMAL HIGH (ref 0.0–0.2)

## 2019-09-15 LAB — COMPREHENSIVE METABOLIC PANEL
ALT: 28 U/L (ref 0–44)
AST: 18 U/L (ref 15–41)
Albumin: 3.9 g/dL (ref 3.5–5.0)
Alkaline Phosphatase: 97 U/L (ref 38–126)
Anion gap: 11 (ref 5–15)
BUN: 15 mg/dL (ref 8–23)
CO2: 26 mmol/L (ref 22–32)
Calcium: 9.2 mg/dL (ref 8.9–10.3)
Chloride: 106 mmol/L (ref 98–111)
Creatinine, Ser: 0.99 mg/dL (ref 0.44–1.00)
GFR calc Af Amer: >60 mL/min (ref >60)
GFR calc non Af Amer: 57 mL/min — ABNORMAL LOW (ref >60)
Glucose, Bld: 113 mg/dL — ABNORMAL HIGH (ref 70–99)
Potassium: 3.7 mmol/L (ref 3.5–5.1)
Sodium: 143 mmol/L (ref 135–145)
Total Bilirubin: 0.4 mg/dL (ref 0.3–1.2)
Total Protein: 7.4 g/dL (ref 6.5–8.1)

## 2019-09-15 MED ORDER — DIPHENHYDRAMINE HCL 50 MG/ML IJ SOLN
INTRAMUSCULAR | Status: AC
  Filled 2019-09-15: qty 1

## 2019-09-15 MED ORDER — HEPARIN SOD (PORK) LOCK FLUSH 100 UNIT/ML IV SOLN
500.0000 [IU] | Freq: Once | INTRAVENOUS | Status: AC | PRN
  Administered 2019-09-15: 12:00:00 500 [IU]
  Filled 2019-09-15: qty 5

## 2019-09-15 MED ORDER — SODIUM CHLORIDE 0.9% FLUSH
10.0000 mL | INTRAVENOUS | Status: DC | PRN
  Administered 2019-09-15: 12:00:00 10 mL
  Filled 2019-09-15: qty 10

## 2019-09-15 MED ORDER — FAMOTIDINE IN NACL 20-0.9 MG/50ML-% IV SOLN
20.0000 mg | Freq: Once | INTRAVENOUS | Status: AC
  Administered 2019-09-15: 10:00:00 20 mg via INTRAVENOUS

## 2019-09-15 MED ORDER — DEXAMETHASONE SODIUM PHOSPHATE 10 MG/ML IJ SOLN
10.0000 mg | Freq: Once | INTRAMUSCULAR | Status: AC
  Administered 2019-09-15: 10 mg via INTRAVENOUS

## 2019-09-15 MED ORDER — DIPHENHYDRAMINE HCL 50 MG/ML IJ SOLN
25.0000 mg | Freq: Once | INTRAMUSCULAR | Status: AC
  Administered 2019-09-15: 10:00:00 25 mg via INTRAVENOUS

## 2019-09-15 MED ORDER — FAMOTIDINE IN NACL 20-0.9 MG/50ML-% IV SOLN
INTRAVENOUS | Status: AC
  Filled 2019-09-15: qty 50

## 2019-09-15 MED ORDER — SODIUM CHLORIDE 0.9% FLUSH
10.0000 mL | INTRAVENOUS | Status: DC | PRN
  Administered 2019-09-15: 09:00:00 10 mL
  Filled 2019-09-15: qty 10

## 2019-09-15 MED ORDER — SODIUM CHLORIDE 0.9 % IV SOLN
80.0000 mg/m2 | Freq: Once | INTRAVENOUS | Status: AC
  Administered 2019-09-15: 162 mg via INTRAVENOUS
  Filled 2019-09-15: qty 27

## 2019-09-15 MED ORDER — DEXAMETHASONE SODIUM PHOSPHATE 10 MG/ML IJ SOLN
INTRAMUSCULAR | Status: AC
  Filled 2019-09-15: qty 1

## 2019-09-15 MED ORDER — SODIUM CHLORIDE 0.9 % IV SOLN
Freq: Once | INTRAVENOUS | Status: AC
  Administered 2019-09-15: 10:00:00 via INTRAVENOUS
  Filled 2019-09-15: qty 250

## 2019-09-15 NOTE — Patient Instructions (Signed)

## 2019-09-15 NOTE — Patient Instructions (Signed)
[facility] Discharge Instructions for Patients Receiving Chemotherapy  Today you received the following chemotherapy agents:  Taxol.  To help prevent nausea and vomiting after your treatment, we encourage you to take your nausea medication as directed.   If you develop nausea and vomiting that is not controlled by your nausea medication, call the clinic.   BELOW ARE SYMPTOMS THAT SHOULD BE REPORTED IMMEDIATELY:  *FEVER GREATER THAN 100.5 F  *CHILLS WITH OR WITHOUT FEVER  NAUSEA AND VOMITING THAT IS NOT CONTROLLED WITH YOUR NAUSEA MEDICATION  *UNUSUAL SHORTNESS OF BREATH  *UNUSUAL BRUISING OR BLEEDING  TENDERNESS IN MOUTH AND THROAT WITH OR WITHOUT PRESENCE OF ULCERS  *URINARY PROBLEMS  *BOWEL PROBLEMS  UNUSUAL RASH Items with * indicate a potential emergency and should be followed up as soon as possible.  Feel free to call the clinic should you have any questions or concerns. The clinic phone number is [phone].  Please show the CHEMO ALERT CARD at check-in to the Emergency Department and triage nurse.   

## 2019-09-20 ENCOUNTER — Telehealth: Payer: Self-pay | Admitting: *Deleted

## 2019-09-20 NOTE — Telephone Encounter (Signed)
This RN spoke with pt's daughter, Elita Quick, per her VM stating concerns regarding doing taxol this week due to " treatment not agreeing with her "  [name] is hoping she can speak with Dr Jana Hakim regarding doing the chemo 1 x a month until the end of the year.  [name] states she would like to keep the lab appointment for this Wednesday.  This RN inquired what symptoms pt is having for possible further interventions.  [name] states " her hair started falling out last week and then she was sick to her stomach last night "  " she lives alone and it is concerning if she has symptoms and no one is there "  [name] states pt took the anti nausea medication prescribed.  Today the pt is not able to eat well ( has had grits and crackers ) and is sipping on water.  [name] is with her today and tomorrow and understands if she feels her mother needs to come in for IVF. This RN did not cancel chemo at this time - pending MD review as well as possible need for IVF support on 11/11.  Return call number for [name] is [phone]

## 2019-09-21 ENCOUNTER — Other Ambulatory Visit: Payer: Self-pay | Admitting: Oncology

## 2019-09-21 NOTE — Progress Notes (Signed)
I had a long discussion with the patient's daughter [name] [name] regarding [name] [name]'s situation.  [name] [name] has lost her hair for her even though she has been forewarned of course.  She also was having more nausea.  The daughter wondered if we should just treat her once a month  We discussed the fact that if we cut the dose significantly we cannot expect any significant response either.  I did suggest that some patients cannot tolerate any side effects and in those cases we simply moved to a supportive care with hospice.  They do want treatment.  We are going to change her treatment to every 2 weeks.  She will be treated again on 09/29/2019 and every 2 weeks thereafter.  When she has had 6 cycles of Taxol under her belt we can reassess.

## 2019-09-22 ENCOUNTER — Telehealth: Payer: Self-pay | Admitting: Oncology

## 2019-09-22 ENCOUNTER — Inpatient Hospital Stay: Payer: Medicare Other

## 2019-09-22 NOTE — Telephone Encounter (Signed)
Called pt per 11/10 sch message - pt daughter is aware of new appt on 11/18

## 2019-09-28 NOTE — Progress Notes (Signed)
[place]  [phone] [phone]    ID: [name] [name] DOB: [birth year]  MR#: [id number]  WLN#:[id number]  Patient Care Team: [name], MD as PCP - General (Internal Medicine) Waynetta Sandy, MD as Consulting Physician (Vascular Surgery) Patsey Berthold, NP as Nurse Practitioner (Cardiology) [name], MD as Consulting Physician (Physical Medicine and Rehabilitation) [name], [name], NP as Nurse Practitioner (Vascular Surgery) Edgardo Roys, PsyD as Consulting Physician (Psychology) Alanah Sakuma, [name], MD as Consulting Physician (Oncology) [name], MD as Consulting Physician (General Surgery) Eppie Gibson, MD as Attending Physician (Radiation Oncology) OTHER MD:   CHIEF COMPLAINT: Inflammatory breast cancer  CURRENT TREATMENT: Neoadjuvant chemotherapy   INTERVAL HISTORY: [name] returns today for follow-up and treatment of her inflammatory breast cancer.  She is accompanied by her daughter [name].  [name] [name] continues on paclitaxel.  Her first dose was 08/18/2019 and then she received a day 8 dose on 08/25/2019.  She started cycle 2 on 09/15/2019 and would have normally received a dose 09/22/2019 but the patient (who concurrently lost her here) felt the treatment was "too much" and thought perhaps once a month might be all she could tolerate.  We discussed this over the phone and the plan currently is to try to get the treatments and every other week.  Incidentally this would allow [place] to use OnPro if necessary.    Since her last visit, she has not undergone any additional studies. Her most recent studies include a bilateral breast MRI on 05/05/2019 and chest CT on 05/10/2019.   REVIEW OF SYSTEMS: [name] found it very difficult to lose her hair, but has more or less gotten used to it.  She is doing her normal activities, which include a little bit of housework.  She does not go out much.  There have been no  unusual headaches visual changes nausea or vomiting.  She denies mouth sores.  The port has been working well.  There have been no intercurrent falls.  She denies cough phlegm production pleurisy or change in bowel bladder habits.  Detailed review of systems was otherwise stable.   HISTORY OF CURRENT ILLNESS: From the original intake note:  [name] [name] presented with right breast discoloration and redness. The right breast also had an area of hardness and nipple retraction.  She did not bring this to medical attention but her daughter did mention it when the patient had neurologic follow-up on 08/10/2018.  At that time the nurse practitioner, [name], noted right nipple inversion, hard breast, and red non-ulcerated lesions on the breast.  The patient then underwent bilateral diagnostic mammography with tomography and right breast ultrasonography at [facility] on 08/11/2018 (this is her first ever mammogram) showing: breast density category B. There was a round 2.5 cm mass at the 1 o'clock upper inner quadrant of the right breast.  By palpation this measured approximately 10 cm.  Sonographically the irregular hypoechoic mass measured 2.7 cm. There was also a hypoechoic mass in the right breast at 9 o'clock upper outer quadrant measuring 3.8 cm. There was an abnormal right axillary lymph node with cortical thickening.   Accordingly on 08/19/2018 she proceeded to biopsy of the right breast areas in question. The pathology from this procedure showed [phone]): At the 1 o'clock: invasive lobular carcinoma, grade II. Prognostic indicators significant for: estrogen receptor, 60% positive with moderate staining intensity and progesterone receptor, 0% negative. Proliferation marker Ki67 at 20%. HER2 negative with an immunohistochemistry of (1+).  At  the 9 o'clock: invasive lobular carcinoma, grade II. Prognostic indicators significant for: estrogen receptor, 90% positive with strong staining intensity and  progesterone receptor, 0% negative. Proliferation marker Ki67 at 30%. HER2 negative with an immunohistochemistry of (1+).  The patient's subsequent history is as detailed below.   PAST MEDICAL HISTORY: Past Medical History:  Diagnosis Date   Anxiety    Cancer [facility])    breast cancer- right   Carotid stenosis    Depression    situational   Dyspnea    with much ambulation   GERD (gastroesophageal reflux disease)    Hypertension    Stroke ([place]) 04/01/2017   a little weakness right side leg and hand, a little memry loss    PAST SURGICAL HISTORY: Past Surgical History:  Procedure Laterality Date   ABDOMINAL HYSTERECTOMY     ENDARTERECTOMY Left 04/08/2017   Procedure: ENDARTERECTOMY CAROTID;  Surgeon: Waynetta Sandy, MD;  Location: [place];  Service: Vascular;  Laterality: Left;   PATCH ANGIOPLASTY Left 04/08/2017   Procedure: PATCH ANGIOPLASTY Left Carotid;  Surgeon: Waynetta Sandy, MD;  Location: [place];  Service: Vascular;  Laterality: Left;   PORTACATH PLACEMENT Left 09/07/2018   Procedure: INSERTION PORT-A-CATH LEFT INTERNAL JUGULAR;  Surgeon: [name], MD;  Location: [place];  Service: General;  Laterality: Left;  total hysterectomy with bilateral salpingo-oophorectomy in her 20's no hormone replacement   FAMILY HISTORY: Family History  Problem Relation Age of Onset   Cancer Mother        ovarian   The patient's father is alive at age 45. The patient's mother died at age 20 due to ovarian cancer. The patient had 3 brothers and 3 sisters. She denies a history of breast, pancreatic, colon, or other cancers in the family.    GYNECOLOGIC HISTORY:  No LMP recorded. Patient has had a hysterectomy. Menarche: Age at first live birth: 73 years old She is GX P1.  She is status post total hysterectomy with BSO in her early 57's. She did not use HRT.    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. She is divorced.  At home is just herself, but her two grandsons regularly come to visit and stay with her. The patient's daughter, [name] works in administration for [employer]. The patient's 73 y.o. grandson is a Freight forwarder for [employer]. The patient's 56 y.o. grandson works in [employer].    ADVANCED DIRECTIVES: Not in place.  At the 08/26/2018 visit the patient was given the appropriate documents to complete and notarized at her discretion.   HEALTH MAINTENANCE: Social History   Tobacco Use   Smoking status: Never Smoker   Smokeless tobacco: Never Used  Substance Use Topics   Alcohol use: No   Drug use: No     Colonoscopy: Never  PAP: Status post hysterectomy  Bone density: Never   No Known Allergies  Current Outpatient Medications  Medication Sig Dispense Refill   amLODipine (NORVASC) 10 MG tablet Take 1 tablet (10 mg total) by mouth daily. PATIENT MUST HAVE OFFICE VISIT AND LABS PRIOR TO ANY FURTHER REFILLS 15 tablet 0   atorvastatin (LIPITOR) 80 MG tablet Take 1 tablet (80 mg total) by mouth daily at 6 PM. 30 tablet 0   clopidogrel (PLAVIX) 75 MG tablet TAKE 1 TABLET BY MOUTH EVERY DAY 30 tablet 1   lidocaine-prilocaine (EMLA) cream Apply to affected area once 30 g 3   lisinopril-hydrochlorothiazide (PRINZIDE,ZESTORETIC) 20-12.5 MG tablet  Take 1 tablet by mouth daily.  11   loratadine (CLARITIN) 10 MG tablet Take 10 mg by mouth daily as needed for allergies.     potassium chloride SA (K-DUR,KLOR-CON) 20 MEQ tablet Take 1 tablet (20 mEq total) by mouth daily. 30 tablet 0   prochlorperazine (COMPAZINE) 10 MG tablet Take 1 tablet (10 mg total) by mouth 3 (three) times daily before meals. 30 tablet 1   No current facility-administered medications for this visit.    Facility-Administered Medications Ordered in Other Visits  Medication Dose Route Frequency Provider Last Rate Last Dose   heparin lock flush 100 unit/mL  500 Units Intracatheter Once PRN Waylan Busta, [name], MD         sodium chloride flush (NS) 0.9 % injection 10 mL  10 mL Intracatheter PRN Liborio Saccente, [name], MD        OBJECTIVE: Older African-American woman who appears stated age  Vitals:   09/29/19 1113  BP: (!) 154/63  Pulse: 93  Resp: 17  Temp: 98.7 F (37.1 C)  SpO2: 98%   Wt Readings from Last 3 Encounters:  09/15/19 200 lb 14.4 oz (91.1 kg)  09/08/19 200 lb 3.2 oz (90.8 kg)  08/25/19 197 lb 6.4 oz (89.5 kg)   Body mass index is 32.43 kg/m.    ECOG FS:2 - Symptomatic, <50% confined to bed  Sclerae unicteric, EOMs intact Wearing a mask No cervical or supraclavicular adenopathy Lungs no rales or rhonchi Heart regular rate and rhythm Abd soft, nontender, positive bowel sounds MSK no focal spinal tenderness, no upper extremity lymphedema Neuro: nonfocal, well oriented, appropriate affect Breasts: The right breast lesions appear to be slightly less inflamed.  It is otherwise stable   Right breast 08/12/2019    Right Breast 04/07/2019     Right Breast 01/08/2019    Right Breast 08/26/2018    LAB RESULTS:  CMP     Component Value Date/Time   NA 143 09/15/2019 0859   K 3.7 09/15/2019 0859   CL 106 09/15/2019 0859   CO2 26 09/15/2019 0859   GLUCOSE 113 (H) 09/15/2019 0859   BUN 15 09/15/2019 0859   CREATININE 0.99 09/15/2019 0859   CREATININE 1.11 (H) 11/03/2018 1017   CALCIUM 9.2 09/15/2019 0859   PROT 7.4 09/15/2019 0859   ALBUMIN 3.9 09/15/2019 0859   AST 18 09/15/2019 0859   AST 32 11/03/2018 1017   ALT 28 09/15/2019 0859   ALT 48 (H) 11/03/2018 1017   ALKPHOS 97 09/15/2019 0859   BILITOT 0.4 09/15/2019 0859   BILITOT 0.5 11/03/2018 1017   GFRNONAA 57 (L) 09/15/2019 0859   GFRNONAA 50 (L) 11/03/2018 1017   GFRAA >60 09/15/2019 0859   GFRAA 58 (L) 11/03/2018 1017    No results found for: TOTALPROTELP, ALBUMINELP, A1GS, A2GS, BETS, BETA2SER, GAMS, MSPIKE, SPEI  No results found for: KPAFRELGTCHN, LAMBDASER, KAPLAMBRATIO  Lab Results  Component  Value Date   WBC 4.7 09/29/2019   NEUTROABS 2.5 09/29/2019   HGB 10.4 (L) 09/29/2019   HCT 33.5 (L) 09/29/2019   MCV 89.1 09/29/2019   PLT 209 09/29/2019    '@LASTCHEMISTRY'$ @  No results found for: LABCA2  No components found for: [id number]  No results for input(s): INR in the last 168 hours.  No results found for: LABCA2  No results found for: UKG254  No results found for: CAN125  No results found for: YHC623  Lab Results  Component Value Date   CA2729 78.6 (H) 08/18/2019  No components found for: HGQUANT  No results found for: CEA1 / No results found for: CEA1   No results found for: AFPTUMOR  No results found for: CHROMOGRNA  No results found for: PSA1  Appointment on 09/29/2019  Component Date Value Ref Range Status   WBC 09/29/2019 4.7  4.0 - 10.5 K/uL Final   RBC 09/29/2019 3.76* 3.87 - 5.11 MIL/uL Final   Hemoglobin 09/29/2019 10.4* 12.0 - 15.0 g/dL Final   HCT 09/29/2019 33.5* 36.0 - 46.0 % Final   MCV 09/29/2019 89.1  80.0 - 100.0 fL Final   MCH 09/29/2019 27.7  26.0 - 34.0 pg Final   MCHC 09/29/2019 31.0  30.0 - 36.0 g/dL Final   RDW 09/29/2019 15.2  11.5 - 15.5 % Final   Platelets 09/29/2019 209  150 - 400 K/uL Final   nRBC 09/29/2019 0.0  0.0 - 0.2 % Final   Neutrophils Relative % 09/29/2019 53  % Final   Neutro Abs 09/29/2019 2.5  1.7 - 7.7 K/uL Final   Lymphocytes Relative 09/29/2019 26  % Final   Lymphs Abs 09/29/2019 1.2  0.7 - 4.0 K/uL Final   Monocytes Relative 09/29/2019 15  % Final   Monocytes Absolute 09/29/2019 0.7  0.1 - 1.0 K/uL Final   Eosinophils Relative 09/29/2019 4  % Final   Eosinophils Absolute 09/29/2019 0.2  0.0 - 0.5 K/uL Final   Basophils Relative 09/29/2019 1  % Final   Basophils Absolute 09/29/2019 0.1  0.0 - 0.1 K/uL Final   Immature Granulocytes 09/29/2019 1  % Final   Abs Immature Granulocytes 09/29/2019 0.03  0.00 - 0.07 K/uL Final   Performed at [facility], [place]  [address] [place], [place] [address]    (this displays the last labs from the last 3 days)  No results found for: TOTALPROTELP, ALBUMINELP, A1GS, A2GS, BETS, BETA2SER, GAMS, MSPIKE, SPEI (this displays SPEP labs)  No results found for: KPAFRELGTCHN, LAMBDASER, KAPLAMBRATIO (kappa/lambda light chains)  No results found for: HGBA, HGBA2QUANT, HGBFQUANT, HGBSQUAN (Hemoglobinopathy evaluation)   No results found for: LDH  No results found for: IRON, TIBC, IRONPCTSAT (Iron and TIBC)  No results found for: FERRITIN  Urinalysis    Component Value Date/Time   COLORURINE AMBER (A) 04/01/2017 0920   APPEARANCEUR CLOUDY (A) 04/01/2017 0920   LABSPEC 1.021 04/01/2017 0920   PHURINE 5.0 04/01/2017 0920   GLUCOSEU NEGATIVE 04/01/2017 0920   HGBUR MODERATE (A) 04/01/2017 0920   BILIRUBINUR NEGATIVE 04/01/2017 0920   KETONESUR 80 (A) 04/01/2017 0920   PROTEINUR NEGATIVE 04/01/2017 0920   NITRITE NEGATIVE 04/01/2017 0920   LEUKOCYTESUR LARGE (A) 04/01/2017 0920     STUDIES: No results found.  ELIGIBLE FOR AVAILABLE RESEARCH PROTOCOL: no   ASSESSMENT: 73 y.o. [place], [place] woman status post right breast biopsy of overlapping sites on 08/19/2018 for a clinical T4 N1, stage IIIB invasive lobular carcinoma, estrogen receptor positive, progesterone receptor negative, HER-2 not amplified, with an MIB-1 of 20%.  (a) CT chest/abd/pelvis 09/02/2018 and bone scan 09/10/2018 show no evidence of stage IV disease  (b) breast MRI 09/14/2018 confirms a multicentric 12.6 cm right breast mass infiltrating pectpralis but w/o associated adenopathy  (1) genetics testing to be scheduled  (2) neoadjuvant chemotherapy consisting of cyclophosphamide, methotrexate and fluorouracil every 21 days x 8, started 09/16/2018, last dose 12/18/2018  (a) CMF chemotherapy stopped after 4 cycles, with no evidence of response by MRI NOV 2019  (5) anastrozole started February 2019  (a) bone  density to be obtained  (b)  palbociclib added 01/19/2019 at 125 mg daily, 21/7  (c) palbociclib dose decreased to 100 mg daily 21 on 7 off, starting with second cycle, April 2020  (d) breast MRI 05/05/2019 shows slight improvement, no evidence of progression, however the tumor is not yet resectable.  Chest CT scan same day shows no evidence of metastatic disease  (e) anastrozole and palbociclib discontinued 08/12/2019 with no significant response  (6) foundation 1/PD-L1 testing:  (a) PD-L1 requested 12/18/2018--insufficient tissue  (b) foundation one testing shows the microsatellite status to be stable, and the mutational burden to be 16/Mb  (c) there are potentially actionable mutations in AKT3, TSC1, KRAS  MA PK1 and RB1  (7) to start paclitaxel 08/18/2019, to be given days 1 and 8 of each 21-28-day cycle  (a) changed every 14 days beginning with second cycle per patient preference  (8) definitive surgery pending  (9) adjuvant radiation to follow   PLAN: Mahlani is tolerating her treatment moderately well.  I do not see a marked response so far but I am hopeful we persist will eventually improve things sufficiently for her to have surgery.  She is scheduled through the end of December.  She will see me specifically 11/10/2019 and just before that she will have a repeat CT of the chest.  I am hopeful we will continue to not show evidence of stage IV disease  I have encouraged them to call me with any issues that may develop before the next visit here.   [name]. Amica Harron, MD  09/29/19 11:30 AM [facility] [place], [place] [address] Tel. [phone]    Fax. [phone]   I, Wilburn Mylar, am acting as scribe for Dr. [name]. Riggin Cuttino.  I, Lurline Del MD, have reviewed the above documentation for accuracy and completeness, and I agree with the above.

## 2019-09-29 ENCOUNTER — Other Ambulatory Visit: Payer: Self-pay

## 2019-09-29 ENCOUNTER — Inpatient Hospital Stay (HOSPITAL_BASED_OUTPATIENT_CLINIC_OR_DEPARTMENT_OTHER): Payer: Medicare Other | Admitting: Oncology

## 2019-09-29 ENCOUNTER — Other Ambulatory Visit: Payer: Medicare Other

## 2019-09-29 ENCOUNTER — Inpatient Hospital Stay: Payer: Medicare Other

## 2019-09-29 ENCOUNTER — Ambulatory Visit: Payer: Medicare Other

## 2019-09-29 ENCOUNTER — Ambulatory Visit: Payer: Medicare Other | Admitting: Adult Health

## 2019-09-29 VITALS — BP 154/63 | HR 93 | Temp 98.7°F | Resp 17 | Ht 66.0 in

## 2019-09-29 DIAGNOSIS — C50911 Malignant neoplasm of unspecified site of right female breast: Secondary | ICD-10-CM

## 2019-09-29 DIAGNOSIS — C50811 Malignant neoplasm of overlapping sites of right female breast: Secondary | ICD-10-CM

## 2019-09-29 DIAGNOSIS — Z170 Estrogen receptor positive status [ER+]: Secondary | ICD-10-CM

## 2019-09-29 DIAGNOSIS — I63132 Cerebral infarction due to embolism of left carotid artery: Secondary | ICD-10-CM

## 2019-09-29 DIAGNOSIS — Z95828 Presence of other vascular implants and grafts: Secondary | ICD-10-CM

## 2019-09-29 DIAGNOSIS — I69359 Hemiplegia and hemiparesis following cerebral infarction affecting unspecified side: Secondary | ICD-10-CM

## 2019-09-29 DIAGNOSIS — Z5111 Encounter for antineoplastic chemotherapy: Secondary | ICD-10-CM | POA: Diagnosis not present

## 2019-09-29 LAB — CBC WITH DIFFERENTIAL/PLATELET
Abs Immature Granulocytes: 0.03 10*3/uL (ref 0.00–0.07)
Basophils Absolute: 0.1 10*3/uL (ref 0.0–0.1)
Basophils Relative: 1 %
Eosinophils Absolute: 0.2 10*3/uL (ref 0.0–0.5)
Eosinophils Relative: 4 %
HCT: 33.5 % — ABNORMAL LOW (ref 36.0–46.0)
Hemoglobin: 10.4 g/dL — ABNORMAL LOW (ref 12.0–15.0)
Immature Granulocytes: 1 %
Lymphocytes Relative: 26 %
Lymphs Abs: 1.2 10*3/uL (ref 0.7–4.0)
MCH: 27.7 pg (ref 26.0–34.0)
MCHC: 31 g/dL (ref 30.0–36.0)
MCV: 89.1 fL (ref 80.0–100.0)
Monocytes Absolute: 0.7 10*3/uL (ref 0.1–1.0)
Monocytes Relative: 15 %
Neutro Abs: 2.5 10*3/uL (ref 1.7–7.7)
Neutrophils Relative %: 53 %
Platelets: 209 10*3/uL (ref 150–400)
RBC: 3.76 MIL/uL — ABNORMAL LOW (ref 3.87–5.11)
RDW: 15.2 % (ref 11.5–15.5)
WBC: 4.7 10*3/uL (ref 4.0–10.5)
nRBC: 0 % (ref 0.0–0.2)

## 2019-09-29 LAB — COMPREHENSIVE METABOLIC PANEL
ALT: 20 U/L (ref 0–44)
AST: 13 U/L — ABNORMAL LOW (ref 15–41)
Albumin: 3.7 g/dL (ref 3.5–5.0)
Alkaline Phosphatase: 105 U/L (ref 38–126)
Anion gap: 10 (ref 5–15)
BUN: 11 mg/dL (ref 8–23)
CO2: 25 mmol/L (ref 22–32)
Calcium: 9.1 mg/dL (ref 8.9–10.3)
Chloride: 107 mmol/L (ref 98–111)
Creatinine, Ser: 0.83 mg/dL (ref 0.44–1.00)
GFR calc Af Amer: >60 mL/min (ref >60)
GFR calc non Af Amer: >60 mL/min (ref >60)
Glucose, Bld: 110 mg/dL — ABNORMAL HIGH (ref 70–99)
Potassium: 3.5 mmol/L (ref 3.5–5.1)
Sodium: 142 mmol/L (ref 135–145)
Total Bilirubin: 0.4 mg/dL (ref 0.3–1.2)
Total Protein: 7 g/dL (ref 6.5–8.1)

## 2019-09-29 MED ORDER — HEPARIN SOD (PORK) LOCK FLUSH 100 UNIT/ML IV SOLN
500.0000 [IU] | Freq: Once | INTRAVENOUS | Status: AC | PRN
  Administered 2019-09-29: 500 [IU]
  Filled 2019-09-29: qty 5

## 2019-09-29 MED ORDER — SODIUM CHLORIDE 0.9% FLUSH
10.0000 mL | INTRAVENOUS | Status: DC | PRN
  Administered 2019-09-29: 11:00:00 10 mL
  Filled 2019-09-29: qty 10

## 2019-09-29 MED ORDER — DEXAMETHASONE SODIUM PHOSPHATE 10 MG/ML IJ SOLN
10.0000 mg | Freq: Once | INTRAMUSCULAR | Status: AC
  Administered 2019-09-29: 13:00:00 10 mg via INTRAVENOUS

## 2019-09-29 MED ORDER — FAMOTIDINE IN NACL 20-0.9 MG/50ML-% IV SOLN
INTRAVENOUS | Status: AC
  Filled 2019-09-29: qty 50

## 2019-09-29 MED ORDER — SODIUM CHLORIDE 0.9% FLUSH
10.0000 mL | INTRAVENOUS | Status: DC | PRN
  Administered 2019-09-29: 10 mL
  Filled 2019-09-29: qty 10

## 2019-09-29 MED ORDER — DIPHENHYDRAMINE HCL 50 MG/ML IJ SOLN
INTRAMUSCULAR | Status: AC
  Filled 2019-09-29: qty 1

## 2019-09-29 MED ORDER — DIPHENHYDRAMINE HCL 50 MG/ML IJ SOLN
25.0000 mg | Freq: Once | INTRAMUSCULAR | Status: AC
  Administered 2019-09-29: 25 mg via INTRAVENOUS

## 2019-09-29 MED ORDER — DEXAMETHASONE SODIUM PHOSPHATE 10 MG/ML IJ SOLN
INTRAMUSCULAR | Status: AC
  Filled 2019-09-29: qty 1

## 2019-09-29 MED ORDER — SODIUM CHLORIDE 0.9 % IV SOLN
Freq: Once | INTRAVENOUS | Status: AC
  Administered 2019-09-29: 13:00:00 via INTRAVENOUS
  Filled 2019-09-29: qty 250

## 2019-09-29 MED ORDER — FAMOTIDINE IN NACL 20-0.9 MG/50ML-% IV SOLN
20.0000 mg | Freq: Once | INTRAVENOUS | Status: AC
  Administered 2019-09-29: 20 mg via INTRAVENOUS

## 2019-09-29 MED ORDER — SODIUM CHLORIDE 0.9 % IV SOLN
80.0000 mg/m2 | Freq: Once | INTRAVENOUS | Status: AC
  Administered 2019-09-29: 162 mg via INTRAVENOUS
  Filled 2019-09-29: qty 27

## 2019-09-29 NOTE — Patient Instructions (Signed)
[facility] Discharge Instructions for Patients Receiving Chemotherapy  Today you received the following chemotherapy agents:  Taxol.  To help prevent nausea and vomiting after your treatment, we encourage you to take your nausea medication as directed.   If you develop nausea and vomiting that is not controlled by your nausea medication, call the clinic.   BELOW ARE SYMPTOMS THAT SHOULD BE REPORTED IMMEDIATELY:  *FEVER GREATER THAN 100.5 F  *CHILLS WITH OR WITHOUT FEVER  NAUSEA AND VOMITING THAT IS NOT CONTROLLED WITH YOUR NAUSEA MEDICATION  *UNUSUAL SHORTNESS OF BREATH  *UNUSUAL BRUISING OR BLEEDING  TENDERNESS IN MOUTH AND THROAT WITH OR WITHOUT PRESENCE OF ULCERS  *URINARY PROBLEMS  *BOWEL PROBLEMS  UNUSUAL RASH Items with * indicate a potential emergency and should be followed up as soon as possible.  Feel free to call the clinic should you have any questions or concerns. The clinic phone number is [phone].  Please show the CHEMO ALERT CARD at check-in to the Emergency Department and triage nurse.   

## 2019-10-06 ENCOUNTER — Other Ambulatory Visit: Payer: Medicare Other

## 2019-10-06 ENCOUNTER — Ambulatory Visit: Payer: Medicare Other

## 2019-10-13 ENCOUNTER — Inpatient Hospital Stay: Payer: Medicare Other | Attending: Oncology

## 2019-10-13 ENCOUNTER — Other Ambulatory Visit: Payer: Self-pay

## 2019-10-13 ENCOUNTER — Inpatient Hospital Stay (HOSPITAL_BASED_OUTPATIENT_CLINIC_OR_DEPARTMENT_OTHER): Payer: Medicare Other | Admitting: Adult Health

## 2019-10-13 ENCOUNTER — Inpatient Hospital Stay: Payer: Medicare Other

## 2019-10-13 ENCOUNTER — Encounter: Payer: Self-pay | Admitting: Adult Health

## 2019-10-13 VITALS — BP 143/61 | HR 87 | Temp 98.7°F | Resp 18 | Ht 66.0 in | Wt 202.8 lb

## 2019-10-13 DIAGNOSIS — C50811 Malignant neoplasm of overlapping sites of right female breast: Secondary | ICD-10-CM | POA: Diagnosis not present

## 2019-10-13 DIAGNOSIS — I63132 Cerebral infarction due to embolism of left carotid artery: Secondary | ICD-10-CM | POA: Diagnosis not present

## 2019-10-13 DIAGNOSIS — C50911 Malignant neoplasm of unspecified site of right female breast: Secondary | ICD-10-CM

## 2019-10-13 DIAGNOSIS — Z170 Estrogen receptor positive status [ER+]: Secondary | ICD-10-CM

## 2019-10-13 DIAGNOSIS — Z5111 Encounter for antineoplastic chemotherapy: Secondary | ICD-10-CM | POA: Diagnosis present

## 2019-10-13 DIAGNOSIS — I69359 Hemiplegia and hemiparesis following cerebral infarction affecting unspecified side: Secondary | ICD-10-CM

## 2019-10-13 DIAGNOSIS — Z95828 Presence of other vascular implants and grafts: Secondary | ICD-10-CM

## 2019-10-13 LAB — CBC WITH DIFFERENTIAL/PLATELET
Abs Immature Granulocytes: 0.04 10*3/uL (ref 0.00–0.07)
Basophils Absolute: 0.1 10*3/uL (ref 0.0–0.1)
Basophils Relative: 1 %
Eosinophils Absolute: 0.1 10*3/uL (ref 0.0–0.5)
Eosinophils Relative: 4 %
HCT: 32.7 % — ABNORMAL LOW (ref 36.0–46.0)
Hemoglobin: 10 g/dL — ABNORMAL LOW (ref 12.0–15.0)
Immature Granulocytes: 1 %
Lymphocytes Relative: 32 %
Lymphs Abs: 1.1 10*3/uL (ref 0.7–4.0)
MCH: 27 pg (ref 26.0–34.0)
MCHC: 30.6 g/dL (ref 30.0–36.0)
MCV: 88.1 fL (ref 80.0–100.0)
Monocytes Absolute: 0.7 10*3/uL (ref 0.1–1.0)
Monocytes Relative: 19 %
Neutro Abs: 1.6 10*3/uL — ABNORMAL LOW (ref 1.7–7.7)
Neutrophils Relative %: 43 %
Platelets: 228 10*3/uL (ref 150–400)
RBC: 3.71 MIL/uL — ABNORMAL LOW (ref 3.87–5.11)
RDW: 15.9 % — ABNORMAL HIGH (ref 11.5–15.5)
WBC: 3.6 10*3/uL — ABNORMAL LOW (ref 4.0–10.5)
nRBC: 0 % (ref 0.0–0.2)

## 2019-10-13 LAB — COMPREHENSIVE METABOLIC PANEL
ALT: 25 U/L (ref 0–44)
AST: 16 U/L (ref 15–41)
Albumin: 3.8 g/dL (ref 3.5–5.0)
Alkaline Phosphatase: 123 U/L (ref 38–126)
Anion gap: 9 (ref 5–15)
BUN: 19 mg/dL (ref 8–23)
CO2: 26 mmol/L (ref 22–32)
Calcium: 9 mg/dL (ref 8.9–10.3)
Chloride: 108 mmol/L (ref 98–111)
Creatinine, Ser: 0.89 mg/dL (ref 0.44–1.00)
GFR calc Af Amer: >60 mL/min (ref >60)
GFR calc non Af Amer: >60 mL/min (ref >60)
Glucose, Bld: 112 mg/dL — ABNORMAL HIGH (ref 70–99)
Potassium: 3.6 mmol/L (ref 3.5–5.1)
Sodium: 143 mmol/L (ref 135–145)
Total Bilirubin: 0.3 mg/dL (ref 0.3–1.2)
Total Protein: 7.1 g/dL (ref 6.5–8.1)

## 2019-10-13 MED ORDER — FAMOTIDINE IN NACL 20-0.9 MG/50ML-% IV SOLN
INTRAVENOUS | Status: AC
  Filled 2019-10-13: qty 50

## 2019-10-13 MED ORDER — DIPHENHYDRAMINE HCL 50 MG/ML IJ SOLN
INTRAMUSCULAR | Status: AC
  Filled 2019-10-13: qty 1

## 2019-10-13 MED ORDER — DEXAMETHASONE SODIUM PHOSPHATE 10 MG/ML IJ SOLN
10.0000 mg | Freq: Once | INTRAMUSCULAR | Status: AC
  Administered 2019-10-13: 11:00:00 10 mg via INTRAVENOUS

## 2019-10-13 MED ORDER — FAMOTIDINE IN NACL 20-0.9 MG/50ML-% IV SOLN
20.0000 mg | Freq: Once | INTRAVENOUS | Status: AC
  Administered 2019-10-13: 20 mg via INTRAVENOUS

## 2019-10-13 MED ORDER — HEPARIN SOD (PORK) LOCK FLUSH 100 UNIT/ML IV SOLN
500.0000 [IU] | Freq: Once | INTRAVENOUS | Status: AC | PRN
  Administered 2019-10-13: 13:00:00 500 [IU]
  Filled 2019-10-13: qty 5

## 2019-10-13 MED ORDER — SODIUM CHLORIDE 0.9 % IV SOLN
80.0000 mg/m2 | Freq: Once | INTRAVENOUS | Status: AC
  Administered 2019-10-13: 12:00:00 162 mg via INTRAVENOUS
  Filled 2019-10-13: qty 27

## 2019-10-13 MED ORDER — DIPHENHYDRAMINE HCL 50 MG/ML IJ SOLN
25.0000 mg | Freq: Once | INTRAMUSCULAR | Status: AC
  Administered 2019-10-13: 11:00:00 25 mg via INTRAVENOUS

## 2019-10-13 MED ORDER — DEXAMETHASONE SODIUM PHOSPHATE 10 MG/ML IJ SOLN
INTRAMUSCULAR | Status: AC
  Filled 2019-10-13: qty 1

## 2019-10-13 MED ORDER — SODIUM CHLORIDE 0.9 % IV SOLN
Freq: Once | INTRAVENOUS | Status: AC
  Administered 2019-10-13: 11:00:00 via INTRAVENOUS
  Filled 2019-10-13: qty 250

## 2019-10-13 MED ORDER — SODIUM CHLORIDE 0.9% FLUSH
10.0000 mL | INTRAVENOUS | Status: DC | PRN
  Administered 2019-10-13: 10 mL
  Filled 2019-10-13: qty 10

## 2019-10-13 NOTE — Progress Notes (Signed)
[place]  Telephone:([phone] Fax:([phone]) [phone]    ID: [name] [name] DOB: 09/26/1946  MR#: [id number]  MHD#:[id number]  Patient Care Team: [name], MD as PCP - General (Internal Medicine) [name] Sandy, MD as Consulting Physician (Vascular Surgery) [name] Berthold, NP as Nurse Practitioner (Cardiology) [name] Staggers, MD as Consulting Physician (Physical Medicine and Rehabilitation) [name], [name] Leyden, NP as Nurse Practitioner (Vascular Surgery) [name] Roys, PsyD as Consulting Physician (Psychology) [name], [name] Dad, MD as Consulting Physician (Oncology) [name] Kussmaul, MD as Consulting Physician (General Surgery) [name] Gibson, MD as Attending Physician (Radiation Oncology) OTHER MD:   CHIEF COMPLAINT: Inflammatory breast cancer  CURRENT TREATMENT: Neoadjuvant chemotherapy   INTERVAL HISTORY: [name] [name] returns today for follow-up and treatment of her inflammatory breast cancer.  She is accompanied by her daughter [name] [name] over the phone.  [name] [name] continues on paclitaxel. She initially started the treatment on days 1 and 8 of a 21 day cycle, however, after cycle 2 day 1, she was feeling more fatigued, and after discussion with Dr. Jana [name] went to every other week Paclitaxel.  She is due for her fifth dose of this today.    Since her last visit, she has not undergone any additional studies. Her most recent studies include a bilateral breast MRI on 05/05/2019 and chest CT on 05/10/2019.  She has a repeat CT chest scheduled on 11/03/2019.   REVIEW OF SYSTEMS: [name] [name] is fatigued.  She and her daughter note that she is not able to do things that she used to enjoy such as reading or puzzles.  Her activity level is decreased, and she is taking more naps throughout the day.  She is sleeping all night, and notes she typically wakes up at 5 am in the mornings, which is normal for her.    [name] [name] denies any fever or chills.  She is not having  any neuropathy.  She denies nausea, vomiting, bowel/bladder changes, headaches, shortness of breath, cough, chest pain, or palpitations.  A detailed ROS was otherwise non contributory.     HISTORY OF CURRENT ILLNESS: From the original intake note:  [name] [name] presented with right breast discoloration and redness. The right breast also had an area of hardness and nipple retraction.  She did not bring this to medical attention but her daughter did mention it when the patient had neurologic follow-up on 08/10/2018.  At that time the nurse practitioner, [name] [name], noted right nipple inversion, hard breast, and red non-ulcerated lesions on the breast.  The patient then underwent bilateral diagnostic mammography with tomography and right breast ultrasonography at [facility] on 08/11/2018 (this is her first ever mammogram) showing: breast density category B. There was a round 2.5 cm mass at the 1 o'clock upper inner quadrant of the right breast.  By palpation this measured approximately 10 cm.  Sonographically the irregular hypoechoic mass measured 2.7 cm. There was also a hypoechoic mass in the right breast at 9 o'clock upper outer quadrant measuring 3.8 cm. There was an abnormal right axillary lymph node with cortical thickening.   Accordingly on 08/19/2018 she proceeded to biopsy of the right breast areas in question. The pathology from this procedure showed [id number]): At the 1 o'clock: invasive lobular carcinoma, grade II. Prognostic indicators significant for: estrogen receptor, 60% positive with moderate staining intensity and progesterone receptor, 0% negative. Proliferation marker Ki67 at 20%. HER2 negative with an immunohistochemistry of (1+).  At the 9 o'clock: invasive lobular carcinoma, grade II. Prognostic indicators significant for:  estrogen receptor, 90% positive with strong staining intensity and progesterone receptor, 0% negative. Proliferation marker Ki67 at 30%. HER2 negative with an  immunohistochemistry of (1+).  The patient's subsequent history is as detailed below.   PAST MEDICAL HISTORY: Past Medical History:  Diagnosis Date  . Anxiety   . Cancer [facility])    breast cancer- right  . Carotid stenosis   . Depression    situational  . Dyspnea    with much ambulation  . GERD (gastroesophageal reflux disease)   . Hypertension   . Stroke ([place]) 04/01/2017   a little weakness right side leg and hand, a little memry loss    PAST SURGICAL HISTORY: Past Surgical History:  Procedure Laterality Date  . ABDOMINAL HYSTERECTOMY    . ENDARTERECTOMY Left 04/08/2017   Procedure: ENDARTERECTOMY CAROTID;  Surgeon: [name] Sandy, MD;  Location: [place];  Service: Vascular;  Laterality: Left;  . PATCH ANGIOPLASTY Left 04/08/2017   Procedure: PATCH ANGIOPLASTY Left Carotid;  Surgeon: [name] Sandy, MD;  Location: [place];  Service: Vascular;  Laterality: Left;  . PORTACATH PLACEMENT Left 09/07/2018   Procedure: INSERTION PORT-A-CATH LEFT INTERNAL JUGULAR;  Surgeon: [name] Kussmaul, MD;  Location: [place];  Service: General;  Laterality: Left;  total hysterectomy with bilateral salpingo-oophorectomy in her 43's no hormone replacement   FAMILY HISTORY: Family History  Problem Relation Age of Onset  . Cancer Mother        ovarian   The patient's father is alive at age 6. The patient's mother died at age 54 due to ovarian cancer. The patient had 3 brothers and 3 sisters. She denies a history of breast, pancreatic, colon, or other cancers in the family.    GYNECOLOGIC HISTORY:  No LMP recorded. Patient has had a hysterectomy. Menarche: Age at first live birth: 73 years old She is GX P1.  She is status post total hysterectomy with BSO in her early 59's. She did not use HRT.    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. She is divorced. At home is just herself, but her two grandsons regularly come to visit and stay with her.  The patient's daughter, [name] [name] works in administration for [employer]. The patient's 53 y.o. grandson is a Freight forwarder for [employer]. The patient's 70 y.o. grandson works in [employer].    ADVANCED DIRECTIVES: Not in place.  At the 08/26/2018 visit the patient was given the appropriate documents to complete and notarized at her discretion.   HEALTH MAINTENANCE: Social History   Tobacco Use  . Smoking status: Never Smoker  . Smokeless tobacco: Never Used  Substance Use Topics  . Alcohol use: No  . Drug use: No     Colonoscopy: Never  PAP: Status post hysterectomy  Bone density: Never   No Known Allergies  Current Outpatient Medications  Medication Sig Dispense Refill  . amLODipine (NORVASC) 10 MG tablet Take 1 tablet (10 mg total) by mouth daily. PATIENT MUST HAVE OFFICE VISIT AND LABS PRIOR TO ANY FURTHER REFILLS 15 tablet 0  . atorvastatin (LIPITOR) 80 MG tablet Take 1 tablet (80 mg total) by mouth daily at 6 PM. 30 tablet 0  . clopidogrel (PLAVIX) 75 MG tablet TAKE 1 TABLET BY MOUTH EVERY DAY 30 tablet 1  . lidocaine-prilocaine (EMLA) cream Apply to affected area once 30 g 3  . lisinopril-hydrochlorothiazide (PRINZIDE,ZESTORETIC) 20-12.5 MG tablet Take 1 tablet by mouth daily.  11  . loratadine (CLARITIN)  10 MG tablet Take 10 mg by mouth daily as needed for allergies.    . potassium chloride SA (K-DUR,KLOR-CON) 20 MEQ tablet Take 1 tablet (20 mEq total) by mouth daily. 30 tablet 0  . prochlorperazine (COMPAZINE) 10 MG tablet Take 1 tablet (10 mg total) by mouth 3 (three) times daily before meals. 30 tablet 1   No current facility-administered medications for this visit.    Facility-Administered Medications Ordered in Other Visits  Medication Dose Route Frequency Provider Last Rate Last Dose  . heparin lock flush 100 unit/mL  500 Units Intracatheter Once PRN [name], [name] Dad, MD      . sodium chloride flush (NS) 0.9 % injection 10 mL  10 mL Intracatheter PRN [name],  [name] Dad, MD        OBJECTIVE:   Vitals:   10/13/19 0934  BP: (!) 143/61  Pulse: 87  Resp: 18  Temp: 98.7 F (37.1 C)  SpO2: 100%   Wt Readings from Last 3 Encounters:  10/13/19 202 lb 12.8 oz (92 kg)  09/15/19 200 lb 14.4 oz (91.1 kg)  09/08/19 200 lb 3.2 oz (90.8 kg)   Body mass index is 32.73 kg/m.    ECOG FS:2 - Symptomatic, <50% confined to bed GENERAL: Patient is a chronically ill appearing woman examined in wheelchair HEENT:  Sclerae anicteric.  Oropharynx clear and moist. No ulcerations or evidence of oropharyngeal candidiasis. Neck is supple.  NODES:  No cervical, supraclavicular, or axillary lymphadenopathy palpated.  BREAST EXAM:  See below LUNGS:  Clear to auscultation bilaterally.  No wheezes or rhonchi. HEART:  Regular rate and rhythm. No murmur appreciated. ABDOMEN:  Soft, nontender.  Positive, normoactive bowel sounds. No organomegaly palpated. MSK:  No focal spinal tenderness to palpation. Full range of motion bilaterally in the upper extremities. EXTREMITIES:  No peripheral edema.   SKIN:  Clear with no obvious rashes or skin changes. No nail dyscrasia. NEURO:  Nonfocal. Well oriented.  Appropriate affect.  Right breast 10/13/2019    Right breast 08/12/2019    Right Breast 04/07/2019     Right Breast 01/08/2019    Right Breast 08/26/2018    LAB RESULTS:  CMP     Component Value Date/Time   NA 143 10/13/2019 0926   K 3.6 10/13/2019 0926   CL 108 10/13/2019 0926   CO2 26 10/13/2019 0926   GLUCOSE 112 (H) 10/13/2019 0926   BUN 19 10/13/2019 0926   CREATININE 0.89 10/13/2019 0926   CREATININE 1.11 (H) 11/03/2018 1017   CALCIUM 9.0 10/13/2019 0926   PROT 7.1 10/13/2019 0926   ALBUMIN 3.8 10/13/2019 0926   AST 16 10/13/2019 0926   AST 32 11/03/2018 1017   ALT 25 10/13/2019 0926   ALT 48 (H) 11/03/2018 1017   ALKPHOS 123 10/13/2019 0926   BILITOT 0.3 10/13/2019 0926   BILITOT 0.5 11/03/2018 1017   GFRNONAA >60 10/13/2019 0926    GFRNONAA 50 (L) 11/03/2018 1017   GFRAA >60 10/13/2019 0926   GFRAA 58 (L) 11/03/2018 1017    No results found for: TOTALPROTELP, ALBUMINELP, A1GS, A2GS, BETS, BETA2SER, GAMS, MSPIKE, SPEI  No results found for: KPAFRELGTCHN, LAMBDASER, KAPLAMBRATIO  Lab Results  Component Value Date   WBC 3.6 (L) 10/13/2019   NEUTROABS 1.6 (L) 10/13/2019   HGB 10.0 (L) 10/13/2019   HCT 32.7 (L) 10/13/2019   MCV 88.1 10/13/2019   PLT 228 10/13/2019    _0 @  No results found for: LABCA2  No components found for: [id number]  No results for input(s): INR in the last 168 hours.  No results found for: LABCA2  No results found for: CAN199  No results found for: EXN170  No results found for: YFV494  Lab Results  Component Value Date   CA2729 78.6 (H) 08/18/2019    No components found for: HGQUANT  No results found for: CEA1 / No results found for: CEA1   No results found for: AFPTUMOR  No results found for: [place]  No results found for: PSA1  Appointment on 10/13/2019  Component Date Value Ref Range Status  . Sodium 10/13/2019 143  135 - 145 mmol/L Final  . Potassium 10/13/2019 3.6  3.5 - 5.1 mmol/L Final  . Chloride 10/13/2019 108  98 - 111 mmol/L Final  . CO2 10/13/2019 26  22 - 32 mmol/L Final  . Glucose, Bld 10/13/2019 112* 70 - 99 mg/dL Final  . BUN 10/13/2019 19  8 - 23 mg/dL Final  . Creatinine, Ser 10/13/2019 0.89  0.44 - 1.00 mg/dL Final  . Calcium 10/13/2019 9.0  8.9 - 10.3 mg/dL Final  . Total Protein 10/13/2019 7.1  6.5 - 8.1 g/dL Final  . Albumin 10/13/2019 3.8  3.5 - 5.0 g/dL Final  . AST 10/13/2019 16  15 - 41 U/L Final  . ALT 10/13/2019 25  0 - 44 U/L Final  . Alkaline Phosphatase 10/13/2019 123  38 - 126 U/L Final  . Total Bilirubin 10/13/2019 0.3  0.3 - 1.2 mg/dL Final  . GFR calc non Af Amer 10/13/2019 >60  >60 mL/min Final  . GFR calc Af Amer 10/13/2019 >60  >60 mL/min Final  . Anion gap 10/13/2019 9  5 - 15 Final   Performed at [facility], [place] [address], [place], [place] [address]  . WBC 10/13/2019 3.6* 4.0 - 10.5 K/uL Final  . RBC 10/13/2019 3.71* 3.87 - 5.11 MIL/uL Final  . Hemoglobin 10/13/2019 10.0* 12.0 - 15.0 g/dL Final  . HCT 10/13/2019 32.7* 36.0 - 46.0 % Final  . MCV 10/13/2019 88.1  80.0 - 100.0 fL Final  . MCH 10/13/2019 27.0  26.0 - 34.0 pg Final  . MCHC 10/13/2019 30.6  30.0 - 36.0 g/dL Final  . RDW 10/13/2019 15.9* 11.5 - 15.5 % Final  . Platelets 10/13/2019 228  150 - 400 K/uL Final  . nRBC 10/13/2019 0.0  0.0 - 0.2 % Final  . Neutrophils Relative % 10/13/2019 43  % Final  . Neutro Abs 10/13/2019 1.6* 1.7 - 7.7 K/uL Final  . Lymphocytes Relative 10/13/2019 32  % Final  . Lymphs Abs 10/13/2019 1.1  0.7 - 4.0 K/uL Final  . Monocytes Relative 10/13/2019 19  % Final  . Monocytes Absolute 10/13/2019 0.7  0.1 - 1.0 K/uL Final  . Eosinophils Relative 10/13/2019 4  % Final  . Eosinophils Absolute 10/13/2019 0.1  0.0 - 0.5 K/uL Final  . Basophils Relative 10/13/2019 1  % Final  . Basophils Absolute 10/13/2019 0.1  0.0 - 0.1 K/uL Final  . Immature Granulocytes 10/13/2019 1  % Final  . Abs Immature Granulocytes 10/13/2019 0.04  0.00 - 0.07 K/uL Final   Performed at [facility], [place] [address], [place], [place] [address]    (this displays the last labs from the last 3 days)  No results found for: TOTALPROTELP, ALBUMINELP, A1GS, A2GS, BETS, BETA2SER, GAMS, MSPIKE, SPEI (this displays SPEP labs)  No results found for: KPAFRELGTCHN, LAMBDASER, KAPLAMBRATIO (kappa/lambda light chains)  No  results found for: HGBA, HGBA2QUANT, HGBFQUANT, HGBSQUAN (Hemoglobinopathy evaluation)   No results found for: LDH  No results found for: IRON, TIBC, IRONPCTSAT (Iron and TIBC)  No results found for: FERRITIN  Urinalysis    Component Value Date/Time   COLORURINE AMBER (A) 04/01/2017 0920   APPEARANCEUR CLOUDY (A) 04/01/2017 0920   LABSPEC 1.021 04/01/2017 0920    PHURINE 5.0 04/01/2017 0920   GLUCOSEU NEGATIVE 04/01/2017 0920   HGBUR MODERATE (A) 04/01/2017 0920   BILIRUBINUR NEGATIVE 04/01/2017 0920   KETONESUR 80 (A) 04/01/2017 0920   PROTEINUR NEGATIVE 04/01/2017 0920   NITRITE NEGATIVE 04/01/2017 0920   LEUKOCYTESUR LARGE (A) 04/01/2017 0920     STUDIES: No results found.  ELIGIBLE FOR AVAILABLE RESEARCH PROTOCOL: no   ASSESSMENT: 73 y.o. [name] [name], [place] woman status post right breast biopsy of overlapping sites on 08/19/2018 for a clinical T4 N1, stage IIIB invasive lobular carcinoma, estrogen receptor positive, progesterone receptor negative, HER-2 not amplified, with an MIB-1 of 20%.  (a) CT chest/abd/pelvis 09/02/2018 and bone scan 09/10/2018 show no evidence of stage IV disease  (b) breast MRI 09/14/2018 confirms a multicentric 12.6 cm right breast mass infiltrating pectpralis but w/o associated adenopathy  (1) genetics testing to be scheduled  (2) neoadjuvant chemotherapy consisting of cyclophosphamide, methotrexate and fluorouracil every 21 days x 8, started 09/16/2018, last dose 12/18/2018  (a) CMF chemotherapy stopped after 4 cycles, with no evidence of response by MRI NOV 2019  (5) anastrozole started February 2019  (a) bone density to be obtained  (b) palbociclib added 01/19/2019 at 125 mg daily, 21/7  (c) palbociclib dose decreased to 100 mg daily 21 on 7 off, starting with second cycle, April 2020  (d) breast MRI 05/05/2019 shows slight improvement, no evidence of progression, however the tumor is not yet resectable.  Chest CT scan same day shows no evidence of metastatic disease  (e) anastrozole and palbociclib discontinued 08/12/2019 with no significant response  (6) foundation 1/PD-L1 testing:  (a) PD-L1 requested 12/18/2018--insufficient tissue  (b) foundation one testing shows the microsatellite status to be stable, and the mutational burden to be 16/Mb  (c) there are potentially actionable mutations in AKT3, TSC1,  KRAS  MA PK1 and RB1  (7) to start paclitaxel 08/18/2019, to be given days 1 and 8 of each 21-28-day cycle  (a) changed every 14 days beginning with second cycle per patient preference  (8) definitive surgery pending  (9) adjuvant radiation to follow   PLAN: [name] [name] is doing moderately well. Her labs are stable and she has no signs of neuropathy.  With the exception of fatigue, she is tolerating treatment well and will continue this.  Her tumor based on the pictures above appears stable to slightly improved from October, and I reviewed that with her.  She reviewed the pictures with me and agrees.  Jaslyne does have a decreased quality of life, because her fatigue from the treatment is interfering with the activities she enjoys.  She, her daughter [name] [name], and I discussed whether or not she is motivated to continue Paclitaxel therapy based on her perceived decreased QOL.  She notes that for the time being she is motivated and wants to continue.  The working plan is that she will continue on therapy every 2 weeks until she has CT chest in late December.  At that point, she will review those results with Dr. Jana [name], and then have more in depth discussion about quality of life, treatment goals, and direction of therapy.  She and  her daughter are appreciative of this approach, and on board.    We will continue to monitor her breast, and if her fatigue worsens, she will let us know.  We will see [name] [name] back in 2 weeks for labs, f/u, and her next treatment.  She was recommended to continue with the appropriate pandemic precautions. She knows to call for any questions that may arise between now and her next appointment.  We are happy to see her sooner if needed.  A total of (30) minutes of face-to-face time was spent with this patient with greater than 50% of that time in counseling and care-coordination.    Wilber Bihari, NP  10/13/19 10:21 AM Medical Oncology and Hematology [facility] [place], [place] [address] Tel. [phone]    Fax. [phone]

## 2019-10-13 NOTE — Patient Instructions (Signed)
[facility] Discharge Instructions for Patients Receiving Chemotherapy  Today you received the following chemotherapy agents:  Taxol.  To help prevent nausea and vomiting after your treatment, we encourage you to take your nausea medication as directed.   If you develop nausea and vomiting that is not controlled by your nausea medication, call the clinic.   BELOW ARE SYMPTOMS THAT SHOULD BE REPORTED IMMEDIATELY:  *FEVER GREATER THAN 100.5 F  *CHILLS WITH OR WITHOUT FEVER  NAUSEA AND VOMITING THAT IS NOT CONTROLLED WITH YOUR NAUSEA MEDICATION  *UNUSUAL SHORTNESS OF BREATH  *UNUSUAL BRUISING OR BLEEDING  TENDERNESS IN MOUTH AND THROAT WITH OR WITHOUT PRESENCE OF ULCERS  *URINARY PROBLEMS  *BOWEL PROBLEMS  UNUSUAL RASH Items with * indicate a potential emergency and should be followed up as soon as possible.  Feel free to call the clinic should you have any questions or concerns. The clinic phone number is [phone].  Please show the CHEMO ALERT CARD at check-in to the Emergency Department and triage nurse.   

## 2019-10-13 NOTE — Progress Notes (Signed)
Right breast [id number]

## 2019-10-16 MED FILL — AMLODIPINE BESYLATE 10 MG T: 10 | 90 days supply | Qty: 90 | Fill #2

## 2019-10-20 ENCOUNTER — Other Ambulatory Visit: Payer: Medicare Other

## 2019-10-20 ENCOUNTER — Ambulatory Visit: Payer: Medicare Other

## 2019-10-20 ENCOUNTER — Ambulatory Visit: Payer: Medicare Other | Admitting: Adult Health

## 2019-10-22 MED FILL — ATORVASTATIN 80 MG TABLET: 80 | 90 days supply | Qty: 90 | Fill #0

## 2019-10-26 NOTE — Progress Notes (Signed)
[place]  [phone]) [phone] Fax:(336) [phone]    ID: [name] [name] DOB: September 01, 1946  MR#: [id number]  JQB#:[id number]  Patient Care Team: [name] Carol, MD as PCP - General (Internal Medicine) [name] Sandy, MD as Consulting Physician (Vascular Surgery) [name] Berthold, NP as Nurse Practitioner (Cardiology) [name] Staggers, MD as Consulting Physician (Physical Medicine and Rehabilitation) [name], [name], NP as Nurse Practitioner (Vascular Surgery) [name] Roys, PsyD as Consulting Physician (Psychology) [name] [name], [name] Dad, MD as Consulting Physician (Oncology) [name] Kussmaul, MD as Consulting Physician (General Surgery) [name] Gibson, MD as Attending Physician (Radiation Oncology) OTHER MD:   CHIEF COMPLAINT: Inflammatory breast cancer  CURRENT TREATMENT: Neoadjuvant chemotherapy   INTERVAL HISTORY: [name] [name] returns today for follow-up and treatment of her inflammatory breast cancer.  She is accompanied by her daughter [name] [name].  [name] continues on paclitaxel every other week.  She generally tolerates this well.  She denies problems with nausea, vomiting, mouth sores, or any issues relating to her port.  Since her last visit, she has not undergone any additional studies. Her most recent studies include a bilateral breast MRI on 05/05/2019 and chest CT on 05/10/2019.  She has a repeat CT chest scheduled on 11/03/2019.   REVIEW OF SYSTEMS: [name] [name] tells me she gets up, has breakfast, next thing she really does is have lunch, which is brought by a senior citizens group and is rather low in salt so she does not really like it.  She watches some Christmas movies, takes a nap, does not get to bed until about 1 in the morning and frequently is up around 4 in the morning.  Then she sits in a recliner until morning.  She denies pain.  She has had no change in bowel or bladder habits.  There have been no unusual headaches.  She still has visual  issues, mostly blurred vision she says.  She has absolutely no symptoms related to peripheral neuropathy.  Overall a detailed review of systems is entirely unchanged from baseline.   HISTORY OF CURRENT ILLNESS: From the original intake note:  [name] [name] presented with right breast discoloration and redness. The right breast also had an area of hardness and nipple retraction.  She did not bring this to medical attention but her daughter did mention it when the patient had neurologic follow-up on 08/10/2018.  At that time the nurse practitioner, [name] [name], noted right nipple inversion, hard breast, and red non-ulcerated lesions on the breast.  The patient then underwent bilateral diagnostic mammography with tomography and right breast ultrasonography at [facility] on 08/11/2018 (this is her first ever mammogram) showing: breast density category B. There was a round 2.5 cm mass at the 1 o'clock upper inner quadrant of the right breast.  By palpation this measured approximately 10 cm.  Sonographically the irregular hypoechoic mass measured 2.7 cm. There was also a hypoechoic mass in the right breast at 9 o'clock upper outer quadrant measuring 3.8 cm. There was an abnormal right axillary lymph node with cortical thickening.   Accordingly on 08/19/2018 she proceeded to biopsy of the right breast areas in question. The pathology from this procedure showed [phone]): At the 1 o'clock: invasive lobular carcinoma, grade II. Prognostic indicators significant for: estrogen receptor, 60% positive with moderate staining intensity and progesterone receptor, 0% negative. Proliferation marker Ki67 at 20%. HER2 negative with an immunohistochemistry of (1+).  At the 9 o'clock: invasive lobular carcinoma, grade II. Prognostic indicators significant for: estrogen receptor, 90% positive with strong  staining intensity and progesterone receptor, 0% negative. Proliferation marker Ki67 at 30%. HER2 negative with an  immunohistochemistry of (1+).  The patient's subsequent history is as detailed below.   PAST MEDICAL HISTORY: Past Medical History:  Diagnosis Date  . Anxiety   . Cancer [facility])    breast cancer- right  . Carotid stenosis   . Depression    situational  . Dyspnea    with much ambulation  . GERD (gastroesophageal reflux disease)   . Hypertension   . Stroke ([place]) 04/01/2017   a little weakness right side leg and hand, a little memry loss    PAST SURGICAL HISTORY: Past Surgical History:  Procedure Laterality Date  . ABDOMINAL HYSTERECTOMY    . ENDARTERECTOMY Left 04/08/2017   Procedure: ENDARTERECTOMY CAROTID;  Surgeon: [name] Sandy, MD;  Location: [place];  Service: Vascular;  Laterality: Left;  . PATCH ANGIOPLASTY Left 04/08/2017   Procedure: PATCH ANGIOPLASTY Left Carotid;  Surgeon: [name] Sandy, MD;  Location: [place];  Service: Vascular;  Laterality: Left;  . PORTACATH PLACEMENT Left 09/07/2018   Procedure: INSERTION PORT-A-CATH LEFT INTERNAL JUGULAR;  Surgeon: [name] Kussmaul, MD;  Location: [place];  Service: General;  Laterality: Left;  total hysterectomy with bilateral salpingo-oophorectomy in her 41's no hormone replacement   FAMILY HISTORY: Family History  Problem Relation Age of Onset  . Cancer Mother        ovarian   The patient's father is alive at age 29. The patient's mother died at age 65 due to ovarian cancer. The patient had 3 brothers and 3 sisters. She denies a history of breast, pancreatic, colon, or other cancers in the family.    GYNECOLOGIC HISTORY:  No LMP recorded. Patient has had a hysterectomy. Menarche: Age at first live birth: 73 years old She is GX P1.  She is status post total hysterectomy with BSO in her early 89's. She did not use HRT.    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. She is divorced. At home is just herself, but her two grandsons regularly come to visit and stay with her.  The patient's daughter, [name] [name] works in administration for [employer]. The patient's 57 y.o. grandson is a Freight forwarder for [employer]. The patient's 28 y.o. grandson works in [employer].    ADVANCED DIRECTIVES: Not in place.  At the 08/26/2018 visit the patient was given the appropriate documents to complete and notarized at her discretion.   HEALTH MAINTENANCE: Social History   Tobacco Use  . Smoking status: Never Smoker  . Smokeless tobacco: Never Used  Substance Use Topics  . Alcohol use: No  . Drug use: No     Colonoscopy: Never  PAP: Status post hysterectomy  Bone density: Never   No Known Allergies  Current Outpatient Medications  Medication Sig Dispense Refill  . amLODipine (NORVASC) 10 MG tablet Take 1 tablet (10 mg total) by mouth daily. PATIENT MUST HAVE OFFICE VISIT AND LABS PRIOR TO ANY FURTHER REFILLS 15 tablet 0  . atorvastatin (LIPITOR) 80 MG tablet Take 1 tablet (80 mg total) by mouth daily at 6 PM. 30 tablet 0  . clopidogrel (PLAVIX) 75 MG tablet TAKE 1 TABLET BY MOUTH EVERY DAY 30 tablet 1  . fluorouracil (EFUDEX) 5 % cream Apply topically daily. 40 g 0  . lidocaine-prilocaine (EMLA) cream Apply to affected area once 30 g 3  . lisinopril-hydrochlorothiazide (PRINZIDE,ZESTORETIC) 20-12.5 MG tablet Take 1 tablet by mouth  daily.  11  . loratadine (CLARITIN) 10 MG tablet Take 10 mg by mouth daily as needed for allergies.    . potassium chloride SA (K-DUR,KLOR-CON) 20 MEQ tablet Take 1 tablet (20 mEq total) by mouth daily. 30 tablet 0  . prochlorperazine (COMPAZINE) 10 MG tablet Take 1 tablet (10 mg total) by mouth 3 (three) times daily before meals. 30 tablet 1   No current facility-administered medications for this visit.   Facility-Administered Medications Ordered in Other Visits  Medication Dose Route Frequency Provider Last Rate Last Admin  . heparin lock flush 100 unit/mL  500 Units Intracatheter Once PRN [name] [name], [name] Dad, MD      . sodium chloride flush  (NS) 0.9 % injection 10 mL  10 mL Intracatheter PRN [name] [name], [name] Dad, MD        OBJECTIVE: Older African-American woman who appears stated age  Vitals:   10/27/19 1320  BP: 138/62  Pulse: (!) 102  Resp: 18  Temp: 98.2 F (36.8 C)  SpO2: 99%   Wt Readings from Last 3 Encounters:  10/27/19 201 lb 6.4 oz (91.4 kg)  10/13/19 202 lb 12.8 oz (92 kg)  09/15/19 200 lb 14.4 oz (91.1 kg)   Body mass index is 32.51 kg/m.    ECOG FS:2 - Symptomatic, <50% confined to bed  Sclerae unicteric, EOMs intact Wearing a mask No cervical or supraclavicular adenopathy Lungs no rales or rhonchi Heart regular rate and rhythm Abd soft, nontender, positive bowel sounds MSK no focal spinal tenderness, no upper extremity lymphedema Neuro: nonfocal, well oriented, appropriate affect Breasts: The right breast is imaged below.  Compared to the last time I saw it, there has been clearly no progression and it appears less inflamed than prior.  The left breast is benign.   Right breast 10/27/2019         Right breast 08/12/2019    Right Breast 04/07/2019   LAB RESULTS:  CMP     Component Value Date/Time   NA 144 10/27/2019 1239   K 3.5 10/27/2019 1239   CL 107 10/27/2019 1239   CO2 27 10/27/2019 1239   GLUCOSE 117 (H) 10/27/2019 1239   BUN 13 10/27/2019 1239   CREATININE 0.89 10/27/2019 1239   CREATININE 1.11 (H) 11/03/2018 1017   CALCIUM 9.0 10/27/2019 1239   PROT 7.3 10/27/2019 1239   ALBUMIN 3.8 10/27/2019 1239   AST 15 10/27/2019 1239   AST 32 11/03/2018 1017   ALT 17 10/27/2019 1239   ALT 48 (H) 11/03/2018 1017   ALKPHOS 107 10/27/2019 1239   BILITOT 0.4 10/27/2019 1239   BILITOT 0.5 11/03/2018 1017   GFRNONAA >60 10/27/2019 1239   GFRNONAA 50 (L) 11/03/2018 1017   GFRAA >60 10/27/2019 1239   GFRAA 58 (L) 11/03/2018 1017    No results found for: TOTALPROTELP, ALBUMINELP, A1GS, A2GS, BETS, BETA2SER, GAMS, MSPIKE, SPEI  No results found for: KPAFRELGTCHN, LAMBDASER,  KAPLAMBRATIO  Lab Results  Component Value Date   WBC 4.2 10/27/2019   NEUTROABS 1.8 10/27/2019   HGB 10.3 (L) 10/27/2019   HCT 33.9 (L) 10/27/2019   MCV 86.7 10/27/2019   PLT 202 10/27/2019    '@LASTCHEMISTRY'$ @  No results found for: LABCA2  No components found for: LNLGXQ119  No results for input(s): INR in the last 168 hours.  No results found for: LABCA2  No results found for: ERD408  No results found for: XKG818  No results found for: HUD149  Lab Results  Component Value Date  CA2729 78.6 (H) 08/18/2019    No components found for: HGQUANT  No results found for: CEA1 / No results found for: CEA1   No results found for: AFPTUMOR  No results found for: CHROMOGRNA  No results found for: PSA1  Appointment on 10/27/2019  Component Date Value Ref Range Status  . Sodium 10/27/2019 144  135 - 145 mmol/L Final  . Potassium 10/27/2019 3.5  3.5 - 5.1 mmol/L Final  . Chloride 10/27/2019 107  98 - 111 mmol/L Final  . CO2 10/27/2019 27  22 - 32 mmol/L Final  . Glucose, Bld 10/27/2019 117* 70 - 99 mg/dL Final  . BUN 10/27/2019 13  8 - 23 mg/dL Final  . Creatinine, Ser 10/27/2019 0.89  0.44 - 1.00 mg/dL Final  . Calcium 10/27/2019 9.0  8.9 - 10.3 mg/dL Final  . Total Protein 10/27/2019 7.3  6.5 - 8.1 g/dL Final  . Albumin 10/27/2019 3.8  3.5 - 5.0 g/dL Final  . AST 10/27/2019 15  15 - 41 U/L Final  . ALT 10/27/2019 17  0 - 44 U/L Final  . Alkaline Phosphatase 10/27/2019 107  38 - 126 U/L Final  . Total Bilirubin 10/27/2019 0.4  0.3 - 1.2 mg/dL Final  . GFR calc non Af Amer 10/27/2019 >60  >60 mL/min Final  . GFR calc Af Amer 10/27/2019 >60  >60 mL/min Final  . Anion gap 10/27/2019 10  5 - 15 Final   Performed at [facility], [place] [address], [place], [place] [address]  . WBC 10/27/2019 4.2  4.0 - 10.5 K/uL Final  . RBC 10/27/2019 3.91  3.87 - 5.11 MIL/uL Final  . Hemoglobin 10/27/2019 10.3* 12.0 - 15.0 g/dL Final  . HCT 10/27/2019 33.9*  36.0 - 46.0 % Final  . MCV 10/27/2019 86.7  80.0 - 100.0 fL Final  . MCH 10/27/2019 26.3  26.0 - 34.0 pg Final  . MCHC 10/27/2019 30.4  30.0 - 36.0 g/dL Final  . RDW 10/27/2019 16.5* 11.5 - 15.5 % Final  . Platelets 10/27/2019 202  150 - 400 K/uL Final  . nRBC 10/27/2019 0.0  0.0 - 0.2 % Final  . Neutrophils Relative % 10/27/2019 44  % Final  . Neutro Abs 10/27/2019 1.8  1.7 - 7.7 K/uL Final  . Lymphocytes Relative 10/27/2019 35  % Final  . Lymphs Abs 10/27/2019 1.5  0.7 - 4.0 K/uL Final  . Monocytes Relative 10/27/2019 17  % Final  . Monocytes Absolute 10/27/2019 0.7  0.1 - 1.0 K/uL Final  . Eosinophils Relative 10/27/2019 2  % Final  . Eosinophils Absolute 10/27/2019 0.1  0.0 - 0.5 K/uL Final  . Basophils Relative 10/27/2019 1  % Final  . Basophils Absolute 10/27/2019 0.1  0.0 - 0.1 K/uL Final  . Immature Granulocytes 10/27/2019 1  % Final  . Abs Immature Granulocytes 10/27/2019 0.03  0.00 - 0.07 K/uL Final   Performed at [facility], [place] [address], [place],  [address]    (this displays the last labs from the last 3 days)  No results found for: TOTALPROTELP, ALBUMINELP, A1GS, A2GS, BETS, BETA2SER, GAMS, MSPIKE, SPEI (this displays SPEP labs)  No results found for: KPAFRELGTCHN, LAMBDASER, KAPLAMBRATIO (kappa/lambda light chains)  No results found for: HGBA, HGBA2QUANT, HGBFQUANT, HGBSQUAN (Hemoglobinopathy evaluation)   No results found for: LDH  No results found for: IRON, TIBC, IRONPCTSAT (Iron and TIBC)  No results found for: FERRITIN  Urinalysis    Component Value  Date/Time   COLORURINE AMBER (A) 04/01/2017 0920   APPEARANCEUR CLOUDY (A) 04/01/2017 0920   LABSPEC 1.021 04/01/2017 0920   PHURINE 5.0 04/01/2017 0920   GLUCOSEU NEGATIVE 04/01/2017 0920   HGBUR MODERATE (A) 04/01/2017 0920   BILIRUBINUR NEGATIVE 04/01/2017 0920   KETONESUR 80 (A) 04/01/2017 0920   PROTEINUR NEGATIVE 04/01/2017 0920   NITRITE NEGATIVE 04/01/2017  0920   LEUKOCYTESUR LARGE (A) 04/01/2017 0920     STUDIES: No results found.  ELIGIBLE FOR AVAILABLE RESEARCH PROTOCOL: no   ASSESSMENT: 73 y.o. [place], [place] woman status post right breast biopsy of overlapping sites on 08/19/2018 for a clinical T4 N1, stage IIIB invasive lobular carcinoma, estrogen receptor positive, progesterone receptor negative, HER-2 not amplified, with an MIB-1 of 20%.  (a) CT chest/abd/pelvis 09/02/2018 and bone scan 09/10/2018 show no evidence of stage IV disease  (b) breast MRI 09/14/2018 confirms a multicentric 12.6 cm right breast mass infiltrating pectpralis but w/o associated adenopathy  (1) genetics testing to be scheduled  (2) neoadjuvant chemotherapy consisting of cyclophosphamide, methotrexate and fluorouracil every 21 days x 8, started 09/16/2018, last dose 12/18/2018  (a) CMF chemotherapy stopped after 4 cycles, with no evidence of response by MRI NOV 2019  (5) anastrozole started February 2019  (a) bone density to be obtained  (b) palbociclib added 01/19/2019 at 125 mg daily, 21/7  (c) palbociclib dose decreased to 100 mg daily 21 on 7 off, starting with second cycle, April 2020  (d) breast MRI 05/05/2019 shows slight improvement, no evidence of progression, however the tumor is not yet resectable.  Chest CT scan same day shows no evidence of metastatic disease  (e) anastrozole and palbociclib discontinued 08/12/2019 with no significant response  (6) foundation 1/PD-L1 testing:  (a) PD-L1 requested 12/18/2018--insufficient tissue  (b) foundation one testing shows the microsatellite status to be stable, and the mutational burden to be 16/Mb  (c) there are potentially actionable mutations in AKT3, TSC1, KRAS  MA PK1 and RB1  (7) to start paclitaxel 08/18/2019, to be given days 1 and 8 of each 21-28-day cycle  (a) changed every 14 days beginning with second cycle per patient preference  (8) definitive surgery pending  (9) adjuvant radiation to  follow   PLAN: Ronne is tolerating paclitaxel generally well, and has not developed any peripheral neuropathy.  We seem to finally be making a little bit of progress.    There is no reason we could not continue this every 2 weeks indefinitely so long as she could tolerated and were willing to receive it.  However when I asked Samayah what her worst problem is she says it is "coming here".  She is set up for CT scan of the chest 11/03/2019 and to see me in 2 weeks.  At that time we will review that scan and the patient will tell me if she is willing to continue the paclitaxel every 2 weeks or if she wants to stop treatment or change treatment.  I suggested perhaps we could start to use Efudex cream on a very [place] portion of the chest wall tumor, namely the most lateral inch or so of tumor.  I instructed the daughter to use gloves when applying it and to do it only once a day on dry skin.  This means that she will have had 2 weeks of treatment by the time she returns to see me.  If we see a significant response perhaps we can add that to the Taxol or even perhaps do  it instead of the Taxol  They know to call for any other issue that may develop before the next visit.   [name] [name]. Jaymes Hang, MD  10/27/19 2:04 PM Medical Oncology and Hematology [facility] [place], [place] [address] Tel. [phone]    Fax. [phone]   I, Wilburn Mylar, am acting as scribe for Dr. Virgie [name]. [name] [name].  I, Lurline Del MD, have reviewed the above documentation for accuracy and completeness, and I agree with the above.

## 2019-10-27 ENCOUNTER — Inpatient Hospital Stay: Payer: Medicare Other

## 2019-10-27 ENCOUNTER — Other Ambulatory Visit: Payer: Medicare Other

## 2019-10-27 ENCOUNTER — Other Ambulatory Visit: Payer: Self-pay

## 2019-10-27 ENCOUNTER — Inpatient Hospital Stay (HOSPITAL_BASED_OUTPATIENT_CLINIC_OR_DEPARTMENT_OTHER): Payer: Medicare Other | Admitting: Oncology

## 2019-10-27 ENCOUNTER — Ambulatory Visit: Payer: Medicare Other

## 2019-10-27 VITALS — HR 99

## 2019-10-27 VITALS — BP 138/62 | HR 102 | Temp 98.2°F | Resp 18 | Ht 66.0 in | Wt 201.4 lb

## 2019-10-27 DIAGNOSIS — C50911 Malignant neoplasm of unspecified site of right female breast: Secondary | ICD-10-CM

## 2019-10-27 DIAGNOSIS — Z95828 Presence of other vascular implants and grafts: Secondary | ICD-10-CM

## 2019-10-27 DIAGNOSIS — C50811 Malignant neoplasm of overlapping sites of right female breast: Secondary | ICD-10-CM

## 2019-10-27 DIAGNOSIS — I69359 Hemiplegia and hemiparesis following cerebral infarction affecting unspecified side: Secondary | ICD-10-CM

## 2019-10-27 DIAGNOSIS — Z170 Estrogen receptor positive status [ER+]: Secondary | ICD-10-CM

## 2019-10-27 DIAGNOSIS — Z5111 Encounter for antineoplastic chemotherapy: Secondary | ICD-10-CM | POA: Diagnosis not present

## 2019-10-27 DIAGNOSIS — I63132 Cerebral infarction due to embolism of left carotid artery: Secondary | ICD-10-CM

## 2019-10-27 LAB — CBC WITH DIFFERENTIAL/PLATELET
Abs Immature Granulocytes: 0.03 10*3/uL (ref 0.00–0.07)
Basophils Absolute: 0.1 10*3/uL (ref 0.0–0.1)
Basophils Relative: 1 %
Eosinophils Absolute: 0.1 10*3/uL (ref 0.0–0.5)
Eosinophils Relative: 2 %
HCT: 33.9 % — ABNORMAL LOW (ref 36.0–46.0)
Hemoglobin: 10.3 g/dL — ABNORMAL LOW (ref 12.0–15.0)
Immature Granulocytes: 1 %
Lymphocytes Relative: 35 %
Lymphs Abs: 1.5 10*3/uL (ref 0.7–4.0)
MCH: 26.3 pg (ref 26.0–34.0)
MCHC: 30.4 g/dL (ref 30.0–36.0)
MCV: 86.7 fL (ref 80.0–100.0)
Monocytes Absolute: 0.7 10*3/uL (ref 0.1–1.0)
Monocytes Relative: 17 %
Neutro Abs: 1.8 10*3/uL (ref 1.7–7.7)
Neutrophils Relative %: 44 %
Platelets: 202 10*3/uL (ref 150–400)
RBC: 3.91 MIL/uL (ref 3.87–5.11)
RDW: 16.5 % — ABNORMAL HIGH (ref 11.5–15.5)
WBC: 4.2 10*3/uL (ref 4.0–10.5)
nRBC: 0 % (ref 0.0–0.2)

## 2019-10-27 LAB — COMPREHENSIVE METABOLIC PANEL
ALT: 17 U/L (ref 0–44)
AST: 15 U/L (ref 15–41)
Albumin: 3.8 g/dL (ref 3.5–5.0)
Alkaline Phosphatase: 107 U/L (ref 38–126)
Anion gap: 10 (ref 5–15)
BUN: 13 mg/dL (ref 8–23)
CO2: 27 mmol/L (ref 22–32)
Calcium: 9 mg/dL (ref 8.9–10.3)
Chloride: 107 mmol/L (ref 98–111)
Creatinine, Ser: 0.89 mg/dL (ref 0.44–1.00)
GFR calc Af Amer: >60 mL/min (ref >60)
GFR calc non Af Amer: >60 mL/min (ref >60)
Glucose, Bld: 117 mg/dL — ABNORMAL HIGH (ref 70–99)
Potassium: 3.5 mmol/L (ref 3.5–5.1)
Sodium: 144 mmol/L (ref 135–145)
Total Bilirubin: 0.4 mg/dL (ref 0.3–1.2)
Total Protein: 7.3 g/dL (ref 6.5–8.1)

## 2019-10-27 MED ORDER — FAMOTIDINE IN NACL 20-0.9 MG/50ML-% IV SOLN
INTRAVENOUS | Status: AC
  Filled 2019-10-27: qty 50

## 2019-10-27 MED ORDER — SODIUM CHLORIDE 0.9% FLUSH
10.0000 mL | INTRAVENOUS | Status: DC | PRN
  Administered 2019-10-27: 10 mL
  Filled 2019-10-27: qty 10

## 2019-10-27 MED ORDER — DIPHENHYDRAMINE HCL 50 MG/ML IJ SOLN
25.0000 mg | Freq: Once | INTRAMUSCULAR | Status: AC
  Administered 2019-10-27: 14:00:00 25 mg via INTRAVENOUS

## 2019-10-27 MED ORDER — SODIUM CHLORIDE 0.9% FLUSH
10.0000 mL | INTRAVENOUS | Status: DC | PRN
  Administered 2019-10-27: 13:00:00 10 mL
  Filled 2019-10-27: qty 10

## 2019-10-27 MED ORDER — HEPARIN SOD (PORK) LOCK FLUSH 100 UNIT/ML IV SOLN
500.0000 [IU] | Freq: Once | INTRAVENOUS | Status: AC | PRN
  Administered 2019-10-27: 16:00:00 500 [IU]
  Filled 2019-10-27: qty 5

## 2019-10-27 MED ORDER — DEXAMETHASONE SODIUM PHOSPHATE 10 MG/ML IJ SOLN
INTRAMUSCULAR | Status: AC
  Filled 2019-10-27: qty 1

## 2019-10-27 MED ORDER — SODIUM CHLORIDE 0.9 % IV SOLN
Freq: Once | INTRAVENOUS | Status: AC
  Filled 2019-10-27: qty 250

## 2019-10-27 MED ORDER — DEXAMETHASONE SODIUM PHOSPHATE 10 MG/ML IJ SOLN
10.0000 mg | Freq: Once | INTRAMUSCULAR | Status: AC
  Administered 2019-10-27: 10 mg via INTRAVENOUS

## 2019-10-27 MED ORDER — DIPHENHYDRAMINE HCL 50 MG/ML IJ SOLN
INTRAMUSCULAR | Status: AC
  Filled 2019-10-27: qty 1

## 2019-10-27 MED ORDER — SODIUM CHLORIDE 0.9 % IV SOLN
80.0000 mg/m2 | Freq: Once | INTRAVENOUS | Status: AC
  Administered 2019-10-27: 15:00:00 162 mg via INTRAVENOUS
  Filled 2019-10-27: qty 27

## 2019-10-27 MED ORDER — FAMOTIDINE IN NACL 20-0.9 MG/50ML-% IV SOLN
20.0000 mg | Freq: Once | INTRAVENOUS | Status: AC
  Administered 2019-10-27: 14:00:00 20 mg via INTRAVENOUS

## 2019-10-27 MED ORDER — FLUOROURACIL 5 % EX CREA: TOPICAL_CREAM | Freq: Every day | CUTANEOUS | 0 refills | Status: DC

## 2019-10-27 MED FILL — FLUOROURACIL 5 % CREA: 5 | 20 days supply | Qty: 40 | Fill #0

## 2019-10-27 NOTE — Patient Instructions (Signed)
[facility] Discharge Instructions for Patients Receiving Chemotherapy  Today you received the following chemotherapy agents taxol  To help prevent nausea and vomiting after your treatment, we encourage you to take your nausea medication as directed   If you develop nausea and vomiting that is not controlled by your nausea medication, call the clinic.   BELOW ARE SYMPTOMS THAT SHOULD BE REPORTED IMMEDIATELY:  *FEVER GREATER THAN 100.5 F  *CHILLS WITH OR WITHOUT FEVER  NAUSEA AND VOMITING THAT IS NOT CONTROLLED WITH YOUR NAUSEA MEDICATION  *UNUSUAL SHORTNESS OF BREATH  *UNUSUAL BRUISING OR BLEEDING  TENDERNESS IN MOUTH AND THROAT WITH OR WITHOUT PRESENCE OF ULCERS  *URINARY PROBLEMS  *BOWEL PROBLEMS  UNUSUAL RASH Items with * indicate a potential emergency and should be followed up as soon as possible.  Feel free to call the clinic should you have any questions or concerns. The clinic phone number is [phone].  Please show the CHEMO ALERT CARD at check-in to the Emergency Department and triage nurse.   

## 2019-11-03 ENCOUNTER — Encounter (HOSPITAL_COMMUNITY): Payer: Self-pay

## 2019-11-03 ENCOUNTER — Other Ambulatory Visit: Payer: Self-pay

## 2019-11-03 ENCOUNTER — Ambulatory Visit (HOSPITAL_COMMUNITY)
Admission: RE | Admit: 2019-11-03 | Discharge: 2019-11-03 | Disposition: A | Discharge status: Home / Self Care | Payer: Medicare Other | Source: Ambulatory Visit | Attending: Oncology | Admitting: Oncology

## 2019-11-03 DIAGNOSIS — C50911 Malignant neoplasm of unspecified site of right female breast: Secondary | ICD-10-CM | POA: Diagnosis present

## 2019-11-03 DIAGNOSIS — C50811 Malignant neoplasm of overlapping sites of right female breast: Secondary | ICD-10-CM | POA: Insufficient documentation

## 2019-11-03 DIAGNOSIS — Z170 Estrogen receptor positive status [ER+]: Secondary | ICD-10-CM | POA: Insufficient documentation

## 2019-11-03 MED ORDER — IOHEXOL 300 MG/ML  SOLN
75.0000 mL | Freq: Once | INTRAMUSCULAR | Status: AC | PRN
  Administered 2019-11-03: 09:00:00 75 mL via INTRAVENOUS

## 2019-11-03 MED ORDER — HEPARIN SOD (PORK) LOCK FLUSH 100 UNIT/ML IV SOLN
INTRAVENOUS | Status: AC
  Administered 2019-11-03: 09:00:00 500 [IU] via INTRAVENOUS
  Filled 2019-11-03: qty 5

## 2019-11-03 MED ORDER — SODIUM CHLORIDE (PF) 0.9 % IJ SOLN
INTRAMUSCULAR | Status: AC
  Filled 2019-11-03: qty 50

## 2019-11-03 MED ORDER — HEPARIN SOD (PORK) LOCK FLUSH 100 UNIT/ML IV SOLN: 500.0000 [IU] | Freq: Once | INTRAVENOUS | Status: AC

## 2019-11-09 MED FILL — CLOPIDOGREL 75 MG TABLET: 75 | 90 days supply | Qty: 90 | Fill #0

## 2019-11-09 NOTE — Progress Notes (Signed)
[facility]  Telephone:([phone]) [phone] Fax:(336) [phone]    ID: [name] [name] DOB: 1945-11-28  MR#: [id number]  WVP#:[id number]  Patient Care Team: [name], MD as PCP - General (Internal Medicine) Waynetta Sandy, MD as Consulting Physician (Vascular Surgery) Patsey Berthold, NP as Nurse Practitioner (Cardiology) [name], MD as Consulting Physician (Physical Medicine and Rehabilitation) [name], [name], NP as Nurse Practitioner (Vascular Surgery) Edgardo Roys, PsyD as Consulting Physician (Psychology) Azeez Dunker, [name], MD as Consulting Physician (Oncology) [name], MD as Consulting Physician (General Surgery) Eppie Gibson, MD as Attending Physician (Radiation Oncology) OTHER MD:   CHIEF COMPLAINT: Inflammatory breast cancer  CURRENT TREATMENT: Neoadjuvant chemotherapy   INTERVAL HISTORY: [name] [name] returns today for follow-up and treatment of her inflammatory breast cancer.  She is accompanied by her daughter [name] [name].  [name] [name] has been receiving paclitaxel every other week.  Generally she appears to tolerate this well but again when asked what her worst problem is she says coming here to receive treatment is the worst problem that she has.  She is actively considering no further treatment.  Since her last visit, she underwent repeat CT chest on 11/03/2019. This was stable with no evidence for metastatic disease and similar appearance of infiltrating tumor in skin thickening in the right breast.  REVIEW OF SYSTEMS: [name] [name] had a quiet Christmas at home.  She mostly stays in the house.  She walks around the house but not for exercise, just as needed.  She denies any unusual headaches visual changes nausea vomiting, gait imbalance, falls, cough, phlegm production, pleurisy, or change in bowel or bladder habits.  She feels her right breast is unchanged.   HISTORY OF CURRENT ILLNESS: From the original intake note:  [name] [name]  presented with right breast discoloration and redness. The right breast also had an area of hardness and nipple retraction.  She did not bring this to medical attention but her daughter did mention it when the patient had neurologic follow-up on 08/10/2018.  At that time the nurse practitioner, [name] [name], noted right nipple inversion, hard breast, and red non-ulcerated lesions on the breast.  The patient then underwent bilateral diagnostic mammography with tomography and right breast ultrasonography at [facility] on 08/11/2018 (this is her first ever mammogram) showing: breast density category B. There was a round 2.5 cm mass at the 1 o'clock upper inner quadrant of the right breast.  By palpation this measured approximately 10 cm.  Sonographically the irregular hypoechoic mass measured 2.7 cm. There was also a hypoechoic mass in the right breast at 9 o'clock upper outer quadrant measuring 3.8 cm. There was an abnormal right axillary lymph node with cortical thickening.   Accordingly on 08/19/2018 she proceeded to biopsy of the right breast areas in question. The pathology from this procedure showed [id number]): At the 1 o'clock: invasive lobular carcinoma, grade II. Prognostic indicators significant for: estrogen receptor, 60% positive with moderate staining intensity and progesterone receptor, 0% negative. Proliferation marker Ki67 at 20%. HER2 negative with an immunohistochemistry of (1+).  At the 9 o'clock: invasive lobular carcinoma, grade II. Prognostic indicators significant for: estrogen receptor, 90% positive with strong staining intensity and progesterone receptor, 0% negative. Proliferation marker Ki67 at 30%. HER2 negative with an immunohistochemistry of (1+).  The patient's subsequent history is as detailed below.   PAST MEDICAL HISTORY: Past Medical History:  Diagnosis Date  . Anxiety   . Cancer [facility])    breast cancer- right  . Carotid stenosis   .  Depression    situational  . Dyspnea     with much ambulation  . GERD (gastroesophageal reflux disease)   . Hypertension   . Stroke ([place]) 04/01/2017   a little weakness right side leg and hand, a little memry loss    PAST SURGICAL HISTORY: Past Surgical History:  Procedure Laterality Date  . ABDOMINAL HYSTERECTOMY    . ENDARTERECTOMY Left 04/08/2017   Procedure: ENDARTERECTOMY CAROTID;  Surgeon: Waynetta Sandy, MD;  Location: [facility];  Service: Vascular;  Laterality: Left;  . PATCH ANGIOPLASTY Left 04/08/2017   Procedure: PATCH ANGIOPLASTY Left Carotid;  Surgeon: Waynetta Sandy, MD;  Location: [facility];  Service: Vascular;  Laterality: Left;  . PORTACATH PLACEMENT Left 09/07/2018   Procedure: INSERTION PORT-A-CATH LEFT INTERNAL JUGULAR;  Surgeon: [name], MD;  Location: [facility];  Service: General;  Laterality: Left;  total hysterectomy with bilateral salpingo-oophorectomy in her 88's no hormone replacement   FAMILY HISTORY: Family History  Problem Relation Age of Onset  . Cancer Mother        ovarian   The patient's father is alive at age 19. The patient's mother died at age 25 due to ovarian cancer. The patient had 3 brothers and 3 sisters. She denies a history of breast, pancreatic, colon, or other cancers in the family.    GYNECOLOGIC HISTORY:  No LMP recorded. Patient has had a hysterectomy. Menarche: Age at first live birth: 73 years old She is GX P1.  She is status post total hysterectomy with BSO in her early 70's. She did not use HRT.    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. She is divorced. At home is just herself, but her two grandsons regularly come to visit and stay with her. The patient's daughter, [name] [name] works in administration for [employer]. The patient's 18 y.o. grandson is a Freight forwarder for [employer]. The patient's 13 y.o. grandson works in [employer].    ADVANCED DIRECTIVES: Not in place.  At the 08/26/2018 visit the patient was given  the appropriate documents to complete and notarized at her discretion.   HEALTH MAINTENANCE: Social History   Tobacco Use  . Smoking status: Never Smoker  . Smokeless tobacco: Never Used  Substance Use Topics  . Alcohol use: No  . Drug use: No     Colonoscopy: Never  PAP: Status post hysterectomy  Bone density: Never   No Known Allergies  Current Outpatient Medications  Medication Sig Dispense Refill  . amLODipine (NORVASC) 10 MG tablet Take 1 tablet (10 mg total) by mouth daily. PATIENT MUST HAVE OFFICE VISIT AND LABS PRIOR TO ANY FURTHER REFILLS 15 tablet 0  . atorvastatin (LIPITOR) 80 MG tablet Take 1 tablet (80 mg total) by mouth daily at 6 PM. 30 tablet 0  . clopidogrel (PLAVIX) 75 MG tablet TAKE 1 TABLET BY MOUTH EVERY DAY 30 tablet 1  . fluorouracil (EFUDEX) 5 % cream Apply topically daily. 40 g 0  . lisinopril-hydrochlorothiazide (PRINZIDE,ZESTORETIC) 20-12.5 MG tablet Take 1 tablet by mouth daily.  11  . loratadine (CLARITIN) 10 MG tablet Take 10 mg by mouth daily as needed for allergies.    . potassium chloride SA (K-DUR,KLOR-CON) 20 MEQ tablet Take 1 tablet (20 mEq total) by mouth daily. 30 tablet 0   No current facility-administered medications for this visit.   Facility-Administered Medications Ordered in Other Visits  Medication Dose Route Frequency Provider Last Rate Last Admin  . heparin  lock flush 100 unit/mL  500 Units Intracatheter Once PRN Latanja Lehenbauer, [name], MD      . sodium chloride flush (NS) 0.9 % injection 10 mL  10 mL Intracatheter PRN Adit Riddles, [name], MD        OBJECTIVE: Older African-American woman in no acute distress  Vitals:   11/10/19 1020  BP: 124/65  Pulse: 67  Resp: 18  Temp: 98.4 F (36.9 C)  SpO2: 100%   Wt Readings from Last 3 Encounters:  11/10/19 206 lb (93.4 kg)  10/27/19 201 lb 6.4 oz (91.4 kg)  10/13/19 202 lb 12.8 oz (92 kg)   Body mass index is 33.25 kg/m.    ECOG FS:2 - Symptomatic, <50% confined to  bed  Sclerae unicteric, EOMs intact Wearing a mask No cervical or supraclavicular adenopathy Lungs no rales or rhonchi Heart regular rate and rhythm Abd soft, nontender, positive bowel sounds MSK no focal spinal tenderness, no upper extremity lymphedema Neuro: nonfocal, well oriented, appropriate affect Breasts: The right breast is generally stable although the round pink lesion in the more medial superior aspect of the lesions appears a little bit more prominent.  This is imaged below.  Right breast 11/10/2019     Right breast 08/12/2019    Right Breast 04/07/2019   LAB RESULTS:  CMP     Component Value Date/Time   NA 144 11/10/2019 1005   K 3.6 11/10/2019 1005   CL 107 11/10/2019 1005   CO2 25 11/10/2019 1005   GLUCOSE 111 (H) 11/10/2019 1005   BUN 15 11/10/2019 1005   CREATININE 0.84 11/10/2019 1005   CREATININE 1.11 (H) 11/03/2018 1017   CALCIUM 8.7 (L) 11/10/2019 1005   PROT 7.0 11/10/2019 1005   ALBUMIN 3.6 11/10/2019 1005   AST 15 11/10/2019 1005   AST 32 11/03/2018 1017   ALT 22 11/10/2019 1005   ALT 48 (H) 11/03/2018 1017   ALKPHOS 107 11/10/2019 1005   BILITOT 0.4 11/10/2019 1005   BILITOT 0.5 11/03/2018 1017   GFRNONAA >60 11/10/2019 1005   GFRNONAA 50 (L) 11/03/2018 1017   GFRAA >60 11/10/2019 1005   GFRAA 58 (L) 11/03/2018 1017    No results found for: TOTALPROTELP, ALBUMINELP, A1GS, A2GS, BETS, BETA2SER, GAMS, MSPIKE, SPEI  No results found for: KPAFRELGTCHN, LAMBDASER, KAPLAMBRATIO  Lab Results  Component Value Date   WBC 4.4 11/10/2019   NEUTROABS 2.4 11/10/2019   HGB 9.8 (L) 11/10/2019   HCT 32.5 (L) 11/10/2019   MCV 85.3 11/10/2019   PLT 233 11/10/2019    No results found for: LABCA2  No components found for: WGYKZL935  No results for input(s): INR in the last 168 hours.  No results found for: LABCA2  No results found for: TSV779  No results found for: TJQ300  No results found for: PQZ300  Lab Results  Component Value  Date   CA2729 78.6 (H) 08/18/2019    No components found for: HGQUANT  No results found for: CEA1 / No results found for: CEA1   No results found for: AFPTUMOR  No results found for: CHROMOGRNA  No results found for: HGBA, HGBA2QUANT, HGBFQUANT, HGBSQUAN (Hemoglobinopathy evaluation)   No results found for: LDH  No results found for: IRON, TIBC, IRONPCTSAT (Iron and TIBC)  No results found for: FERRITIN  Urinalysis    Component Value Date/Time   COLORURINE AMBER (A) 04/01/2017 0920   APPEARANCEUR CLOUDY (A) 04/01/2017 0920   LABSPEC 1.021 04/01/2017 0920   PHURINE 5.0 04/01/2017 0920  GLUCOSEU NEGATIVE 04/01/2017 0920   HGBUR MODERATE (A) 04/01/2017 0920   BILIRUBINUR NEGATIVE 04/01/2017 0920   KETONESUR 80 (A) 04/01/2017 0920   PROTEINUR NEGATIVE 04/01/2017 0920   NITRITE NEGATIVE 04/01/2017 0920   LEUKOCYTESUR LARGE (A) 04/01/2017 0920     STUDIES: CT Chest W Contrast  Result Date: 11/03/2019 CLINICAL DATA:  Breast cancer.  Restaging. EXAM: CT CHEST WITH CONTRAST TECHNIQUE: Multidetector CT imaging of the chest was performed during intravenous contrast administration. CONTRAST:  18m OMNIPAQUE IOHEXOL 300 MG/ML  SOLN COMPARISON:  05/10/2019. FINDINGS: Cardiovascular: The heart size is normal. No substantial pericardial effusion. Coronary artery calcification is evident. Mitral valve calcification noted. Atherosclerotic calcification is noted in the wall of the thoracic aorta. Left Port-A-Cath tip is positioned in the distal SVC. Mediastinum/Nodes: No mediastinal lymphadenopathy. There is no hilar lymphadenopathy. The esophagus has normal imaging features. There is no axillary lymphadenopathy. Lungs/Pleura: No suspicious pulmonary nodule or mass. Tiny left upper lobe nodules described previously are visible on images 37 and 60 of series 7 today and are unchanged. No focal airspace consolidation. No pleural effusion. Upper Abdomen: Prominent gastric fundal diverticulum.  Musculoskeletal: No worrisome lytic or sclerotic osseous abnormality. Skin thickening with asymmetric soft tissue attenuation in the right breast is similar to prior. IMPRESSION: 1. Stable exam.  No evidence for metastatic disease. 2. Similar appearance of infiltrating tumor in skin thickening in the right breast. 3.  Aortic Atherosclerois (ICD10-170.0) Electronically Signed   By: EMisty StanleyM.D.   On: 11/03/2019 11:53    ELIGIBLE FOR AVAILABLE RESEARCH PROTOCOL: no   ASSESSMENT: 73y.o. [place] status post right breast biopsy of overlapping sites on 08/19/2018 for a clinical T4 N1, stage IIIB invasive lobular carcinoma, estrogen receptor positive, progesterone receptor negative, HER-2 not amplified, with an MIB-1 of 20%.  (a) CT chest/abd/pelvis 09/02/2018 and bone scan 09/10/2018 show no evidence of stage IV disease  (b) breast MRI 09/14/2018 confirms a multicentric 12.6 cm right breast mass infiltrating pectpralis but w/o associated adenopathy  (1) genetics testing to be scheduled  (2) neoadjuvant chemotherapy consisting of cyclophosphamide, methotrexate and fluorouracil every 21 days x 8, started 09/16/2018, last dose 12/18/2018  (a) CMF chemotherapy stopped after 4 cycles, with no evidence of response by MRI NOV 2019  (5) anastrozole started February 2019  (a) bone density to be obtained  (b) palbociclib added 01/19/2019 at 125 mg daily, 21/7  (c) palbociclib dose decreased to 100 mg daily 21 on 7 off, starting with second cycle, April 2020  (d) breast MRI 05/05/2019 shows slight improvement, no evidence of progression, however the tumor is not yet resectable.  Chest CT scan same day shows no evidence of metastatic disease  (e) anastrozole and palbociclib discontinued 08/12/2019 with no significant response  (6) foundation 1/PD-L1 testing:  (a) PD-L1 requested 12/18/2018--insufficient tissue  (b) foundation one testing shows the microsatellite status to be stable, and the  mutational burden to be 16/Mb  (c) there are potentially actionable mutations in AKT3, TSC1, KRAS  MAPK1 and RB1  (7) started paclitaxel 08/18/2019, given days 1 and 8 of each 21-28-day cycle  (a) changed to every 14 days beginning with second cycle per patient preference  (b) discontinued after 10/27/2019 dose with no evidence of response (minimal evidence of progression)  (8) exemestane/ everolimus started 11/11/2018  (9) definitive surgery pending  (10) adjuvant radiation to follow   PLAN: CEverlinaremains remarkably stable and there is only minimal change in the right breast area.  It  is favorable that the CT scan of the chest just obtained does not show evidence of metastatic disease.  Unfortunately we cannot proceed to right modified radical mastectomy.  She has been evaluated for this previously by surgery and given the apparent involvement of the pectoralis muscle the feeling was that we should treat neoadjuvantly and try to shrink this tumor before proceeding to surgery.  .  At this point we are going to switch back to antiestrogens.  We discussed the possibility of fulvestrant versus exemestane + everolimus.  They opted for fulvestrant and she will receive her first dose today.  She will receive her second dose in 2 weeks and then her third dose of 4 weeks from today after which it will be every 28 days.  They have a good understanding of the possible toxicities side effects and complications of this medication which however is generally quite well-tolerated.  They are going to see me 4 weeks from now with a dose #3.  I am hopeful that we will see at least disease stability but hopefully some response after 4 months on treatment  I spent approximately 30 minutes face to face with Jasira with more than 50% of that time spent in counseling and coordination of care.  They know to call for any other issue that may develop before the next visit.  [name]. Joselin Crandell, MD  11/11/19 10:43  AM Medical Oncology and Hematology [facility] [place], [place] [address] Tel. [phone]    Fax. [phone]   I, Wilburn Mylar, am acting as scribe for Dr. [name]. Ahmari Duerson.  I, Lurline Del MD, have reviewed the above documentation for accuracy and completeness, and I agree with the above.

## 2019-11-10 ENCOUNTER — Inpatient Hospital Stay: Payer: Medicare Other

## 2019-11-10 ENCOUNTER — Inpatient Hospital Stay (HOSPITAL_BASED_OUTPATIENT_CLINIC_OR_DEPARTMENT_OTHER): Payer: Medicare Other | Admitting: Oncology

## 2019-11-10 ENCOUNTER — Telehealth: Payer: Self-pay | Admitting: Oncology

## 2019-11-10 ENCOUNTER — Other Ambulatory Visit: Payer: Self-pay

## 2019-11-10 VITALS — BP 124/65 | HR 67 | Temp 98.4°F | Resp 18 | Ht 66.0 in | Wt 206.0 lb

## 2019-11-10 DIAGNOSIS — I63132 Cerebral infarction due to embolism of left carotid artery: Secondary | ICD-10-CM

## 2019-11-10 DIAGNOSIS — C50911 Malignant neoplasm of unspecified site of right female breast: Secondary | ICD-10-CM

## 2019-11-10 DIAGNOSIS — C50811 Malignant neoplasm of overlapping sites of right female breast: Secondary | ICD-10-CM

## 2019-11-10 DIAGNOSIS — Z170 Estrogen receptor positive status [ER+]: Secondary | ICD-10-CM

## 2019-11-10 DIAGNOSIS — Z95828 Presence of other vascular implants and grafts: Secondary | ICD-10-CM

## 2019-11-10 DIAGNOSIS — Z5111 Encounter for antineoplastic chemotherapy: Secondary | ICD-10-CM | POA: Diagnosis not present

## 2019-11-10 DIAGNOSIS — I69359 Hemiplegia and hemiparesis following cerebral infarction affecting unspecified side: Secondary | ICD-10-CM

## 2019-11-10 LAB — CBC WITH DIFFERENTIAL/PLATELET
Abs Immature Granulocytes: 0.03 10*3/uL (ref 0.00–0.07)
Basophils Absolute: 0.1 10*3/uL (ref 0.0–0.1)
Basophils Relative: 1 %
Eosinophils Absolute: 0.1 10*3/uL (ref 0.0–0.5)
Eosinophils Relative: 2 %
HCT: 32.5 % — ABNORMAL LOW (ref 36.0–46.0)
Hemoglobin: 9.8 g/dL — ABNORMAL LOW (ref 12.0–15.0)
Immature Granulocytes: 1 %
Lymphocytes Relative: 24 %
Lymphs Abs: 1.1 10*3/uL (ref 0.7–4.0)
MCH: 25.7 pg — ABNORMAL LOW (ref 26.0–34.0)
MCHC: 30.2 g/dL (ref 30.0–36.0)
MCV: 85.3 fL (ref 80.0–100.0)
Monocytes Absolute: 0.8 10*3/uL (ref 0.1–1.0)
Monocytes Relative: 17 %
Neutro Abs: 2.4 10*3/uL (ref 1.7–7.7)
Neutrophils Relative %: 55 %
Platelets: 233 10*3/uL (ref 150–400)
RBC: 3.81 MIL/uL — ABNORMAL LOW (ref 3.87–5.11)
RDW: 17.1 % — ABNORMAL HIGH (ref 11.5–15.5)
WBC: 4.4 10*3/uL (ref 4.0–10.5)
nRBC: 0 % (ref 0.0–0.2)

## 2019-11-10 LAB — COMPREHENSIVE METABOLIC PANEL
ALT: 22 U/L (ref 0–44)
AST: 15 U/L (ref 15–41)
Albumin: 3.6 g/dL (ref 3.5–5.0)
Alkaline Phosphatase: 107 U/L (ref 38–126)
Anion gap: 12 (ref 5–15)
BUN: 15 mg/dL (ref 8–23)
CO2: 25 mmol/L (ref 22–32)
Calcium: 8.7 mg/dL — ABNORMAL LOW (ref 8.9–10.3)
Chloride: 107 mmol/L (ref 98–111)
Creatinine, Ser: 0.84 mg/dL (ref 0.44–1.00)
GFR calc Af Amer: >60 mL/min (ref >60)
GFR calc non Af Amer: >60 mL/min (ref >60)
Glucose, Bld: 111 mg/dL — ABNORMAL HIGH (ref 70–99)
Potassium: 3.6 mmol/L (ref 3.5–5.1)
Sodium: 144 mmol/L (ref 135–145)
Total Bilirubin: 0.4 mg/dL (ref 0.3–1.2)
Total Protein: 7 g/dL (ref 6.5–8.1)

## 2019-11-10 MED ORDER — FULVESTRANT 250 MG/5ML IM SOLN
INTRAMUSCULAR | Status: AC
  Filled 2019-11-10: qty 10

## 2019-11-10 MED ORDER — FULVESTRANT 250 MG/5ML IM SOLN
500.0000 mg | Freq: Once | INTRAMUSCULAR | Status: AC
  Administered 2019-11-10: 500 mg via INTRAMUSCULAR

## 2019-11-10 MED ORDER — SODIUM CHLORIDE 0.9% FLUSH
10.0000 mL | INTRAVENOUS | Status: DC | PRN
  Administered 2019-11-10: 10 mL
  Filled 2019-11-10: qty 10

## 2019-11-10 NOTE — Patient Instructions (Signed)
Fulvestrant injection What is this medicine? FULVESTRANT (ful VES trant) blocks the effects of estrogen. It is used to treat breast cancer. This medicine may be used for other purposes; ask your health care provider or pharmacist if you have questions. COMMON BRAND NAME(S): FASLODEX What should I tell my health care provider before I take this medicine? They need to know if you have any of these conditions:  bleeding disorders  liver disease  low blood counts, like low white cell, platelet, or red cell counts  an unusual or allergic reaction to fulvestrant, other medicines, foods, dyes, or preservatives  pregnant or trying to get pregnant  breast-feeding How should I use this medicine? This medicine is for injection into a muscle. It is usually given by a health care professional in a hospital or clinic setting. Talk to your pediatrician regarding the use of this medicine in children. Special care may be needed. Overdosage: If you think you have taken too much of this medicine contact a poison control center or emergency room at once. NOTE: This medicine is only for you. Do not share this medicine with others. What if I miss a dose? It is important not to miss your dose. Call your doctor or health care professional if you are unable to keep an appointment. What may interact with this medicine?  medicines that treat or prevent blood clots like warfarin, enoxaparin, dalteparin, apixaban, dabigatran, and rivaroxaban This list may not describe all possible interactions. Give your health care provider a list of all the medicines, herbs, non-prescription drugs, or dietary supplements you use. Also tell them if you smoke, drink alcohol, or use illegal drugs. Some items may interact with your medicine. What should I watch for while using this medicine? Your condition will be monitored carefully while you are receiving this medicine. You will need important blood work done while you are taking  this medicine. Do not become pregnant while taking this medicine or for at least 1 year after stopping it. Women of child-bearing potential will need to have a negative pregnancy test before starting this medicine. Women should inform their doctor if they wish to become pregnant or think they might be pregnant. There is a potential for serious side effects to an unborn child. Men should inform their doctors if they wish to father a child. This medicine may lower sperm counts. Talk to your health care professional or pharmacist for more information. Do not breast-feed an infant while taking this medicine or for 1 year after the last dose. What side effects may I notice from receiving this medicine? Side effects that you should report to your doctor or health care professional as soon as possible:  allergic reactions like skin rash, itching or hives, swelling of the face, lips, or tongue  feeling faint or lightheaded, falls  pain, tingling, numbness, or weakness in the legs  signs and symptoms of infection like fever or chills; cough; flu-like symptoms; sore throat  vaginal bleeding Side effects that usually do not require medical attention (report to your doctor or health care professional if they continue or are bothersome):  aches, pains  constipation  diarrhea  headache  hot flashes  nausea, vomiting  pain at site where injected  stomach pain This list may not describe all possible side effects. Call your doctor for medical advice about side effects. You may report side effects to FDA at 1-800-FDA-1088. Where should I keep my medicine? This drug is given in a hospital or clinic and will   not be stored at home. NOTE: This sheet is a summary. It may not cover all possible information. If you have questions about this medicine, talk to your doctor, pharmacist, or health care provider.  2020 Elsevier/Gold Standard (2018-02-05 11:34:41)  

## 2019-11-10 NOTE — Telephone Encounter (Signed)
Scheduled per los. Gave avs and calendar  

## 2019-11-10 NOTE — Patient Instructions (Signed)

## 2019-11-17 ENCOUNTER — Ambulatory Visit: Payer: Medicare Other | Admitting: Oncology

## 2019-11-17 ENCOUNTER — Other Ambulatory Visit: Payer: Medicare Other

## 2019-11-17 ENCOUNTER — Ambulatory Visit: Payer: Medicare Other

## 2019-11-24 ENCOUNTER — Inpatient Hospital Stay: Payer: Medicare Other | Attending: Oncology

## 2019-11-24 ENCOUNTER — Other Ambulatory Visit: Payer: Self-pay

## 2019-11-24 VITALS — BP 128/62 | HR 62 | Temp 98.2°F | Resp 18

## 2019-11-24 DIAGNOSIS — Z95828 Presence of other vascular implants and grafts: Secondary | ICD-10-CM

## 2019-11-24 DIAGNOSIS — C50811 Malignant neoplasm of overlapping sites of right female breast: Secondary | ICD-10-CM | POA: Insufficient documentation

## 2019-11-24 DIAGNOSIS — Z5111 Encounter for antineoplastic chemotherapy: Secondary | ICD-10-CM | POA: Insufficient documentation

## 2019-11-24 MED ORDER — FULVESTRANT 250 MG/5ML IM SOLN
500.0000 mg | Freq: Once | INTRAMUSCULAR | Status: AC
  Administered 2019-11-24: 09:00:00 500 mg via INTRAMUSCULAR

## 2019-11-24 MED ORDER — FULVESTRANT 250 MG/5ML IM SOLN
INTRAMUSCULAR | Status: AC
  Filled 2019-11-24: qty 10

## 2019-11-24 NOTE — Patient Instructions (Signed)
Fulvestrant injection What is this medicine? FULVESTRANT (ful VES trant) blocks the effects of estrogen. It is used to treat breast cancer. This medicine may be used for other purposes; ask your health care provider or pharmacist if you have questions. COMMON BRAND NAME(S): FASLODEX What should I tell my health care provider before I take this medicine? They need to know if you have any of these conditions:  bleeding disorders  liver disease  low blood counts, like low white cell, platelet, or red cell counts  an unusual or allergic reaction to fulvestrant, other medicines, foods, dyes, or preservatives  pregnant or trying to get pregnant  breast-feeding How should I use this medicine? This medicine is for injection into a muscle. It is usually given by a health care professional in a hospital or clinic setting. Talk to your pediatrician regarding the use of this medicine in children. Special care may be needed. Overdosage: If you think you have taken too much of this medicine contact a poison control center or emergency room at once. NOTE: This medicine is only for you. Do not share this medicine with others. What if I miss a dose? It is important not to miss your dose. Call your doctor or health care professional if you are unable to keep an appointment. What may interact with this medicine?  medicines that treat or prevent blood clots like warfarin, enoxaparin, dalteparin, apixaban, dabigatran, and rivaroxaban This list may not describe all possible interactions. Give your health care provider a list of all the medicines, herbs, non-prescription drugs, or dietary supplements you use. Also tell them if you smoke, drink alcohol, or use illegal drugs. Some items may interact with your medicine. What should I watch for while using this medicine? Your condition will be monitored carefully while you are receiving this medicine. You will need important blood work done while you are taking  this medicine. Do not become pregnant while taking this medicine or for at least 1 year after stopping it. Women of child-bearing potential will need to have a negative pregnancy test before starting this medicine. Women should inform their doctor if they wish to become pregnant or think they might be pregnant. There is a potential for serious side effects to an unborn child. Men should inform their doctors if they wish to father a child. This medicine may lower sperm counts. Talk to your health care professional or pharmacist for more information. Do not breast-feed an infant while taking this medicine or for 1 year after the last dose. What side effects may I notice from receiving this medicine? Side effects that you should report to your doctor or health care professional as soon as possible:  allergic reactions like skin rash, itching or hives, swelling of the face, lips, or tongue  feeling faint or lightheaded, falls  pain, tingling, numbness, or weakness in the legs  signs and symptoms of infection like fever or chills; cough; flu-like symptoms; sore throat  vaginal bleeding Side effects that usually do not require medical attention (report to your doctor or health care professional if they continue or are bothersome):  aches, pains  constipation  diarrhea  headache  hot flashes  nausea, vomiting  pain at site where injected  stomach pain This list may not describe all possible side effects. Call your doctor for medical advice about side effects. You may report side effects to FDA at 1-800-FDA-1088. Where should I keep my medicine? This drug is given in a hospital or clinic and will   not be stored at home. NOTE: This sheet is a summary. It may not cover all possible information. If you have questions about this medicine, talk to your doctor, pharmacist, or health care provider.  2020 Elsevier/Gold Standard (2018-02-05 11:34:41)  

## 2019-12-01 MED FILL — LISINOPRIL-HCTZ 20-12.5 MG: 20-12.5 | 90 days supply | Qty: 90 | Fill #1

## 2019-12-01 NOTE — Telephone Encounter (Signed)
No entry 

## 2019-12-06 NOTE — Progress Notes (Signed)
[name] [name]  [phone] [phone]    ID: [name] [name] DOB: November 22, 1945  MR#: [id number]  PQD#:[id number]  Patient Care Team: [name], MD as PCP - General (Internal Medicine) Waynetta Sandy, MD as Consulting Physician (Vascular Surgery) Patsey Berthold, NP as Nurse Practitioner (Cardiology) [name], MD as Consulting Physician (Physical Medicine and Rehabilitation) [name], [name], NP as Nurse Practitioner (Vascular Surgery) Edgardo Roys, PsyD as Consulting Physician (Psychology) Ashlen Kiger, [name], MD as Consulting Physician (Oncology) [name], MD as Consulting Physician (General Surgery) Eppie Gibson, MD as Attending Physician (Radiation Oncology) OTHER MD:   CHIEF COMPLAINT: Inflammatory breast cancer  CURRENT TREATMENT: Neoadjuvant chemotherapy   INTERVAL HISTORY: [name] [name] returns today for follow-up and treatment of her inflammatory breast cancer.  She is accompanied by her daughter [name].  She was changed to fulvestrant on 11/10/2019, with her third dose due today.  So far she is tolerating treatment well.  She has a little stinging with these shot but has had no other side effects that she is aware of  REVIEW OF SYSTEMS: [name] [name] feels weak.  The weakness is not focal.  She just does not have a lot of energy.  She gets out of bed in the morning has breakfast and then pretty much lies in a recliner.  She sometimes dozes and sometimes not.  She is not doing any kind of exercise.  Prior to the Covid pandemic she had been walking to the mailbox or to the corner of the block with her daughter.  She is now pretty much stuck in the house.  She has not yet been scheduled for a vaccine and is not sure that she wanted vaccine if it becomes available.  She did go to a Christmas gathering with family and enjoyed that.  Aside from these issues she has a little bit of shaking in her right hand at times.  A detailed  review of systems was otherwise stable   HISTORY OF CURRENT ILLNESS: From the original intake note:  [name] [name] presented with right breast discoloration and redness. The right breast also had an area of hardness and nipple retraction.  She did not bring this to medical attention but her daughter did mention it when the patient had neurologic follow-up on 08/10/2018.  At that time the nurse practitioner, [name], noted right nipple inversion, hard breast, and red non-ulcerated lesions on the breast.  The patient then underwent bilateral diagnostic mammography with tomography and right breast ultrasonography at [facility] on 08/11/2018 (this is her first ever mammogram) showing: breast density category B. There was a round 2.5 cm mass at the 1 o'clock upper inner quadrant of the right breast.  By palpation this measured approximately 10 cm.  Sonographically the irregular hypoechoic mass measured 2.7 cm. There was also a hypoechoic mass in the right breast at 9 o'clock upper outer quadrant measuring 3.8 cm. There was an abnormal right axillary lymph node with cortical thickening.   Accordingly on 08/19/2018 she proceeded to biopsy of the right breast areas in question. The pathology from this procedure showed [id number]): At the 1 o'clock: invasive lobular carcinoma, grade II. Prognostic indicators significant for: estrogen receptor, 60% positive with moderate staining intensity and progesterone receptor, 0% negative. Proliferation marker Ki67 at 20%. HER2 negative with an immunohistochemistry of (1+).  At the 9 o'clock: invasive lobular carcinoma, grade II. Prognostic indicators significant for: estrogen receptor, 90% positive with strong staining intensity and progesterone receptor, 0% negative.  Proliferation marker Ki67 at 30%. HER2 negative with an immunohistochemistry of (1+).  The patient's subsequent history is as detailed below.   PAST MEDICAL HISTORY: Past Medical History:  Diagnosis  Date  . Anxiety   . Cancer [facility])    breast cancer- right  . Carotid stenosis   . Depression    situational  . Dyspnea    with much ambulation  . GERD (gastroesophageal reflux disease)   . Hypertension   . Stroke ([place]) 04/01/2017   a little weakness right side leg and hand, a little memry loss    PAST SURGICAL HISTORY: Past Surgical History:  Procedure Laterality Date  . ABDOMINAL HYSTERECTOMY    . ENDARTERECTOMY Left 04/08/2017   Procedure: ENDARTERECTOMY CAROTID;  Surgeon: Waynetta Sandy, MD;  Location: [facility];  Service: Vascular;  Laterality: Left;  . PATCH ANGIOPLASTY Left 04/08/2017   Procedure: PATCH ANGIOPLASTY Left Carotid;  Surgeon: Waynetta Sandy, MD;  Location: [place];  Service: Vascular;  Laterality: Left;  . PORTACATH PLACEMENT Left 09/07/2018   Procedure: INSERTION PORT-A-CATH LEFT INTERNAL JUGULAR;  Surgeon: [name], MD;  Location: [place];  Service: General;  Laterality: Left;  total hysterectomy with bilateral salpingo-oophorectomy in her 79's no hormone replacement   FAMILY HISTORY: Family History  Problem Relation Age of Onset  . Cancer Mother        ovarian   The patient's father is alive at age 40. The patient's mother died at age 27 due to ovarian cancer. The patient had 3 brothers and 3 sisters. She denies a history of breast, pancreatic, colon, or other cancers in the family.    GYNECOLOGIC HISTORY:  No LMP recorded. Patient has had a hysterectomy. Menarche: Age at first live birth: 74 years old She is GX P1.  She is status post total hysterectomy with BSO in her early 34's. She did not use HRT.    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. She is divorced. At home is just herself, but her two grandsons regularly come to visit and stay with her. The patient's daughter, [name] works in administration for [employer]. The patient's 70 y.o. grandson is a Freight forwarder for [employer]. The patient's  19 y.o. grandson works in [employer].    ADVANCED DIRECTIVES: Not in place.  At the 08/26/2018 visit the patient was given the appropriate documents to complete and notarized at her discretion.   HEALTH MAINTENANCE: Social History   Tobacco Use  . Smoking status: Never Smoker  . Smokeless tobacco: Never Used  Substance Use Topics  . Alcohol use: No  . Drug use: No     Colonoscopy: Never  PAP: Status post hysterectomy  Bone density: Never   No Known Allergies  Current Outpatient Medications  Medication Sig Dispense Refill  . amLODipine (NORVASC) 10 MG tablet Take 1 tablet (10 mg total) by mouth daily. PATIENT MUST HAVE OFFICE VISIT AND LABS PRIOR TO ANY FURTHER REFILLS 15 tablet 0  . atorvastatin (LIPITOR) 80 MG tablet Take 1 tablet (80 mg total) by mouth daily at 6 PM. 30 tablet 0  . clopidogrel (PLAVIX) 75 MG tablet TAKE 1 TABLET BY MOUTH EVERY DAY 30 tablet 1  . fluorouracil (EFUDEX) 5 % cream Apply topically daily. 40 g 0  . lisinopril-hydrochlorothiazide (PRINZIDE,ZESTORETIC) 20-12.5 MG tablet Take 1 tablet by mouth daily.  11  . loratadine (CLARITIN) 10 MG tablet Take 10 mg by mouth daily as needed for allergies.    Marland Kitchen  potassium chloride SA (K-DUR,KLOR-CON) 20 MEQ tablet Take 1 tablet (20 mEq total) by mouth daily. 30 tablet 0   No current facility-administered medications for this visit.   Facility-Administered Medications Ordered in Other Visits  Medication Dose Route Frequency Provider Last Rate Last Admin  . heparin lock flush 100 unit/mL  500 Units Intracatheter Once PRN Kendalynn Wideman, [name], MD      . sodium chloride flush (NS) 0.9 % injection 10 mL  10 mL Intracatheter PRN Kashlynn Kundert, [name], MD        OBJECTIVE: Older African-American [name] who appears stated age  Vitals:   12/07/19 0904  BP: (!) 139/56  Pulse: 83  Resp: 18  Temp: 99.4 F (37.4 C)  SpO2: 100%   Wt Readings from Last 3 Encounters:  12/07/19 205 lb 1.6 oz (93 kg)  11/10/19 206 lb (93.4 kg)    10/27/19 201 lb 6.4 oz (91.4 kg)   Body mass index is 33.1 kg/m.    ECOG FS:2 - Symptomatic, <50% confined to bed  Sclerae unicteric, EOMs intact Wearing a mask No cervical or supraclavicular adenopathy Lungs no rales or rhonchi Heart regular rate and rhythm Abd soft, nontender, positive bowel sounds MSK no focal spinal tenderness, no upper extremity lymphedema Neuro: nonfocal, well oriented, appropriate affect Breasts: The right breast looks pretty much like it did at the end of December except that the round nodule in the upper inner aspect of the tumor area seems slightly larger.   Right breast 11/10/2019     Right breast 08/12/2019    Right Breast 04/07/2019   LAB RESULTS:  CMP     Component Value Date/Time   NA 144 11/10/2019 1005   K 3.6 11/10/2019 1005   CL 107 11/10/2019 1005   CO2 25 11/10/2019 1005   GLUCOSE 111 (H) 11/10/2019 1005   BUN 15 11/10/2019 1005   CREATININE 0.84 11/10/2019 1005   CREATININE 1.11 (H) 11/03/2018 1017   CALCIUM 8.7 (L) 11/10/2019 1005   PROT 7.0 11/10/2019 1005   ALBUMIN 3.6 11/10/2019 1005   AST 15 11/10/2019 1005   AST 32 11/03/2018 1017   ALT 22 11/10/2019 1005   ALT 48 (H) 11/03/2018 1017   ALKPHOS 107 11/10/2019 1005   BILITOT 0.4 11/10/2019 1005   BILITOT 0.5 11/03/2018 1017   GFRNONAA >60 11/10/2019 1005   GFRNONAA 50 (L) 11/03/2018 1017   GFRAA >60 11/10/2019 1005   GFRAA 58 (L) 11/03/2018 1017    No results found for: TOTALPROTELP, ALBUMINELP, A1GS, A2GS, BETS, BETA2SER, GAMS, MSPIKE, SPEI  No results found for: KPAFRELGTCHN, LAMBDASER, KAPLAMBRATIO  Lab Results  Component Value Date   WBC 5.8 12/07/2019   NEUTROABS 3.1 12/07/2019   HGB 10.1 (L) 12/07/2019   HCT 33.2 (L) 12/07/2019   MCV 81.0 12/07/2019   PLT 200 12/07/2019    No results found for: LABCA2  No components found for: [id number]  No results for input(s): INR in the last 168 hours.  No results found for: LABCA2  No results found  for: KGS811  No results found for: SRP594  No results found for: VOP929  Lab Results  Component Value Date   CA2729 78.6 (H) 08/18/2019    No components found for: HGQUANT  No results found for: CEA1 / No results found for: CEA1   No results found for: AFPTUMOR  No results found for: [place]  No results found for: HGBA, HGBA2QUANT, HGBFQUANT, HGBSQUAN (Hemoglobinopathy evaluation)   No results found for: LDH  No results found for: IRON, TIBC, IRONPCTSAT (Iron and TIBC)  No results found for: FERRITIN  Urinalysis    Component Value Date/Time   COLORURINE AMBER (A) 04/01/2017 0920   APPEARANCEUR CLOUDY (A) 04/01/2017 0920   LABSPEC 1.021 04/01/2017 0920   PHURINE 5.0 04/01/2017 0920   GLUCOSEU NEGATIVE 04/01/2017 0920   HGBUR MODERATE (A) 04/01/2017 0920   BILIRUBINUR NEGATIVE 04/01/2017 0920   KETONESUR 80 (A) 04/01/2017 0920   PROTEINUR NEGATIVE 04/01/2017 0920   NITRITE NEGATIVE 04/01/2017 0920   LEUKOCYTESUR LARGE (A) 04/01/2017 0920     STUDIES: No results found.  ELIGIBLE FOR AVAILABLE RESEARCH PROTOCOL: no   ASSESSMENT: 74 y.o. [name] [name], [name] [name] status post right breast biopsy of overlapping sites on 08/19/2018 for a clinical T4 N1, stage IIIB invasive lobular carcinoma, estrogen receptor positive, progesterone receptor negative, HER-2 not amplified, with an MIB-1 of 20%.  (a) CT chest/abd/pelvis 09/02/2018 and bone scan 09/10/2018 show no evidence of stage IV disease  (b) breast MRI 09/14/2018 confirms a multicentric 12.6 cm right breast mass infiltrating pectpralis but w/o associated adenopathy  (1) genetics testing to be scheduled  (2) neoadjuvant chemotherapy consisting of cyclophosphamide, methotrexate and fluorouracil every 21 days x 8, started 09/16/2018, last dose 12/18/2018  (a) CMF chemotherapy stopped after 4 cycles, with no evidence of response by MRI NOV 2019  (5) anastrozole started February 2019  (a) bone density to be  obtained  (b) palbociclib added 01/19/2019 at 125 mg daily, 21/7  (c) palbociclib dose decreased to 100 mg daily 21 on 7 off, starting with second cycle, April 2020  (d) breast MRI 05/05/2019 shows slight improvement, no evidence of progression, however the tumor is not yet resectable.  Chest CT scan same day shows no evidence of metastatic disease  (e) anastrozole and palbociclib discontinued 08/12/2019 with no significant response  (6) foundation 1/PD-L1 testing:  (a) PD-L1 requested 12/18/2018--insufficient tissue  (b) foundation one testing shows the microsatellite status to be stable, and the mutational burden to be 16/Mb  (c) there are potentially actionable mutations in AKT3, TSC1, KRAS  MAPK1 and RB1  (7) started paclitaxel 08/18/2019, given days 1 and 8 of each 21-28-day cycle  (a) changed to every 14 days beginning with second cycle per patient preference  (b) discontinued after 10/27/2019 dose with no evidence of response (minimal evidence of progression)  (8) fulvestrant started 11/10/2019.  (9) definitive surgery pending  (10) adjuvant radiation to follow   PLAN: [name] [name] is now a little over a year out from her initial diagnosis of breast cancer.  We have not been able to find a treatment that she can easily tolerated and is effective enough to shrink her cancer sufficiently to allow her to proceed to surgery.  She is currently receiving fulvestrant and from today's dose as she will move to monthly.  She is tolerating this well and likes the fact that she does not have to come here so often.  I do not think we can get her to be any stronger unless she starts some kind of exercise program.  The very simplest thing would be for her to take several little walks in her apartment during the day.  I asked her to do that 4 times and to write the times when she does that so she can show that to her daughter.  I encouraged [name] to go ahead and enroll [name] [name] on a vaccine schedule so  she can get it done.  Right now [name] [name] is very  hesitant to receive the vaccine but if we do not get her enrolled certainly nothing is going to happen in that regard.  If she had 2 shots of the vaccine I think she might be more amenable to go for example to a senior citizen center once or twice a week and and become a little bit more active.  She will return in 4 weeks for her fourth dose and then in 8 weeks for her fifth dose and I will see her with the fifth dose.  If we have not had any improvement we will consider exemestane/everolimus.  They know to call for any other issue that may develop before the next visit.  Total encounter time 25 minutes.Sarajane Jews C. Cleveland Yarbro, MD  12/07/19 9:19 AM Medical Oncology and Hematology [facility] [place], [place] [address] Tel. [phone]    Fax. [phone]   I, Wilburn Mylar, am acting as scribe for Dr. [name]. Daisi Kentner.  I, Lurline Del MD, have reviewed the above documentation for accuracy and completeness, and I agree with the above.   *Total Encounter Time as defined by the Centers for Medicare and Medicaid Services includes, in addition to the face-to-face time of a patient visit (documented in the note above) non-face-to-face time: obtaining and reviewing outside history, ordering and reviewing medications, tests or procedures, care coordination (communications with other health care professionals or caregivers) and documentation in the medical record.

## 2019-12-07 ENCOUNTER — Inpatient Hospital Stay: Payer: Medicare Other

## 2019-12-07 ENCOUNTER — Other Ambulatory Visit: Payer: Self-pay

## 2019-12-07 ENCOUNTER — Inpatient Hospital Stay (HOSPITAL_BASED_OUTPATIENT_CLINIC_OR_DEPARTMENT_OTHER): Payer: Medicare Other | Admitting: Oncology

## 2019-12-07 VITALS — BP 139/56 | HR 83 | Temp 99.4°F | Resp 18 | Ht 66.0 in | Wt 205.1 lb

## 2019-12-07 DIAGNOSIS — Z9889 Other specified postprocedural states: Secondary | ICD-10-CM

## 2019-12-07 DIAGNOSIS — Z170 Estrogen receptor positive status [ER+]: Secondary | ICD-10-CM

## 2019-12-07 DIAGNOSIS — I63132 Cerebral infarction due to embolism of left carotid artery: Secondary | ICD-10-CM

## 2019-12-07 DIAGNOSIS — I6389 Other cerebral infarction: Secondary | ICD-10-CM | POA: Diagnosis not present

## 2019-12-07 DIAGNOSIS — C50911 Malignant neoplasm of unspecified site of right female breast: Secondary | ICD-10-CM

## 2019-12-07 DIAGNOSIS — C50811 Malignant neoplasm of overlapping sites of right female breast: Secondary | ICD-10-CM

## 2019-12-07 DIAGNOSIS — R918 Other nonspecific abnormal finding of lung field: Secondary | ICD-10-CM

## 2019-12-07 DIAGNOSIS — I69359 Hemiplegia and hemiparesis following cerebral infarction affecting unspecified side: Secondary | ICD-10-CM

## 2019-12-07 DIAGNOSIS — Z95828 Presence of other vascular implants and grafts: Secondary | ICD-10-CM

## 2019-12-07 DIAGNOSIS — Z5111 Encounter for antineoplastic chemotherapy: Secondary | ICD-10-CM | POA: Diagnosis not present

## 2019-12-07 LAB — COMPREHENSIVE METABOLIC PANEL
ALT: 16 U/L (ref 0–44)
AST: 14 U/L — ABNORMAL LOW (ref 15–41)
Albumin: 3.7 g/dL (ref 3.5–5.0)
Alkaline Phosphatase: 107 U/L (ref 38–126)
Anion gap: 9 (ref 5–15)
BUN: 16 mg/dL (ref 8–23)
CO2: 26 mmol/L (ref 22–32)
Calcium: 8.6 mg/dL — ABNORMAL LOW (ref 8.9–10.3)
Chloride: 108 mmol/L (ref 98–111)
Creatinine, Ser: 0.84 mg/dL (ref 0.44–1.00)
GFR calc Af Amer: >60 mL/min (ref >60)
GFR calc non Af Amer: >60 mL/min (ref >60)
Glucose, Bld: 114 mg/dL — ABNORMAL HIGH (ref 70–99)
Potassium: 3.3 mmol/L — ABNORMAL LOW (ref 3.5–5.1)
Sodium: 143 mmol/L (ref 135–145)
Total Bilirubin: 0.4 mg/dL (ref 0.3–1.2)
Total Protein: 6.9 g/dL (ref 6.5–8.1)

## 2019-12-07 LAB — CBC WITH DIFFERENTIAL/PLATELET
Abs Immature Granulocytes: 0.02 10*3/uL (ref 0.00–0.07)
Basophils Absolute: 0 10*3/uL (ref 0.0–0.1)
Basophils Relative: 1 %
Eosinophils Absolute: 0.2 10*3/uL (ref 0.0–0.5)
Eosinophils Relative: 4 %
HCT: 33.2 % — ABNORMAL LOW (ref 36.0–46.0)
Hemoglobin: 10.1 g/dL — ABNORMAL LOW (ref 12.0–15.0)
Immature Granulocytes: 0 %
Lymphocytes Relative: 28 %
Lymphs Abs: 1.6 10*3/uL (ref 0.7–4.0)
MCH: 24.6 pg — ABNORMAL LOW (ref 26.0–34.0)
MCHC: 30.4 g/dL (ref 30.0–36.0)
MCV: 81 fL (ref 80.0–100.0)
Monocytes Absolute: 0.8 10*3/uL (ref 0.1–1.0)
Monocytes Relative: 13 %
Neutro Abs: 3.1 10*3/uL (ref 1.7–7.7)
Neutrophils Relative %: 54 %
Platelets: 200 10*3/uL (ref 150–400)
RBC: 4.1 MIL/uL (ref 3.87–5.11)
RDW: 17.2 % — ABNORMAL HIGH (ref 11.5–15.5)
WBC: 5.8 10*3/uL (ref 4.0–10.5)
nRBC: 0 % (ref 0.0–0.2)

## 2019-12-07 MED ORDER — SODIUM CHLORIDE 0.9% FLUSH
10.0000 mL | INTRAVENOUS | Status: DC | PRN
  Administered 2019-12-07: 10 mL via INTRAVENOUS
  Filled 2019-12-07: qty 10

## 2019-12-07 MED ORDER — FULVESTRANT 250 MG/5ML IM SOLN
INTRAMUSCULAR | Status: AC
  Filled 2019-12-07: qty 10

## 2019-12-07 MED ORDER — FULVESTRANT 250 MG/5ML IM SOLN
500.0000 mg | Freq: Once | INTRAMUSCULAR | Status: AC
  Administered 2019-12-07: 10:00:00 500 mg via INTRAMUSCULAR

## 2019-12-07 MED ORDER — HEPARIN SOD (PORK) LOCK FLUSH 100 UNIT/ML IV SOLN
500.0000 [IU] | Freq: Once | INTRAVENOUS | Status: AC
  Administered 2019-12-07: 500 [IU] via INTRAVENOUS
  Filled 2019-12-07: qty 5

## 2019-12-07 NOTE — Patient Instructions (Signed)
Fulvestrant injection What is this medicine? FULVESTRANT (ful VES trant) blocks the effects of estrogen. It is used to treat breast cancer. This medicine may be used for other purposes; ask your health care provider or pharmacist if you have questions. COMMON BRAND NAME(S): FASLODEX What should I tell my health care provider before I take this medicine? They need to know if you have any of these conditions:  bleeding disorders  liver disease  low blood counts, like low white cell, platelet, or red cell counts  an unusual or allergic reaction to fulvestrant, other medicines, foods, dyes, or preservatives  pregnant or trying to get pregnant  breast-feeding How should I use this medicine? This medicine is for injection into a muscle. It is usually given by a health care professional in a hospital or clinic setting. Talk to your pediatrician regarding the use of this medicine in children. Special care may be needed. Overdosage: If you think you have taken too much of this medicine contact a poison control center or emergency room at once. NOTE: This medicine is only for you. Do not share this medicine with others. What if I miss a dose? It is important not to miss your dose. Call your doctor or health care professional if you are unable to keep an appointment. What may interact with this medicine?  medicines that treat or prevent blood clots like warfarin, enoxaparin, dalteparin, apixaban, dabigatran, and rivaroxaban This list may not describe all possible interactions. Give your health care provider a list of all the medicines, herbs, non-prescription drugs, or dietary supplements you use. Also tell them if you smoke, drink alcohol, or use illegal drugs. Some items may interact with your medicine. What should I watch for while using this medicine? Your condition will be monitored carefully while you are receiving this medicine. You will need important blood work done while you are taking  this medicine. Do not become pregnant while taking this medicine or for at least 1 year after stopping it. Women of child-bearing potential will need to have a negative pregnancy test before starting this medicine. Women should inform their doctor if they wish to become pregnant or think they might be pregnant. There is a potential for serious side effects to an unborn child. Men should inform their doctors if they wish to father a child. This medicine may lower sperm counts. Talk to your health care professional or pharmacist for more information. Do not breast-feed an infant while taking this medicine or for 1 year after the last dose. What side effects may I notice from receiving this medicine? Side effects that you should report to your doctor or health care professional as soon as possible:  allergic reactions like skin rash, itching or hives, swelling of the face, lips, or tongue  feeling faint or lightheaded, falls  pain, tingling, numbness, or weakness in the legs  signs and symptoms of infection like fever or chills; cough; flu-like symptoms; sore throat  vaginal bleeding Side effects that usually do not require medical attention (report to your doctor or health care professional if they continue or are bothersome):  aches, pains  constipation  diarrhea  headache  hot flashes  nausea, vomiting  pain at site where injected  stomach pain This list may not describe all possible side effects. Call your doctor for medical advice about side effects. You may report side effects to FDA at 1-800-FDA-1088. Where should I keep my medicine? This drug is given in a hospital or clinic and will   not be stored at home. NOTE: This sheet is a summary. It may not cover all possible information. If you have questions about this medicine, talk to your doctor, pharmacist, or health care provider.  2020 Elsevier/Gold Standard (2018-02-05 11:34:41)  

## 2019-12-08 ENCOUNTER — Telehealth: Payer: Self-pay | Admitting: Oncology

## 2019-12-08 NOTE — Telephone Encounter (Signed)
I talk with patient regarding schedule  

## 2019-12-13 ENCOUNTER — Telehealth: Payer: Self-pay | Admitting: Oncology

## 2019-12-13 NOTE — Telephone Encounter (Signed)
R/s appt per 1/27 sch message - unable to reach pt . Left message with appt date and time

## 2020-01-04 ENCOUNTER — Other Ambulatory Visit: Payer: Medicare Other

## 2020-01-04 ENCOUNTER — Ambulatory Visit: Payer: Medicare Other

## 2020-01-05 ENCOUNTER — Inpatient Hospital Stay: Payer: Medicare Other

## 2020-01-05 ENCOUNTER — Other Ambulatory Visit: Payer: Self-pay

## 2020-01-05 ENCOUNTER — Inpatient Hospital Stay: Payer: Medicare Other | Attending: Oncology

## 2020-01-05 VITALS — BP 132/62

## 2020-01-05 DIAGNOSIS — I69359 Hemiplegia and hemiparesis following cerebral infarction affecting unspecified side: Secondary | ICD-10-CM

## 2020-01-05 DIAGNOSIS — I63132 Cerebral infarction due to embolism of left carotid artery: Secondary | ICD-10-CM

## 2020-01-05 DIAGNOSIS — C50911 Malignant neoplasm of unspecified site of right female breast: Secondary | ICD-10-CM

## 2020-01-05 DIAGNOSIS — Z95828 Presence of other vascular implants and grafts: Secondary | ICD-10-CM

## 2020-01-05 DIAGNOSIS — C50811 Malignant neoplasm of overlapping sites of right female breast: Secondary | ICD-10-CM | POA: Diagnosis not present

## 2020-01-05 DIAGNOSIS — Z5111 Encounter for antineoplastic chemotherapy: Secondary | ICD-10-CM | POA: Diagnosis not present

## 2020-01-05 LAB — COMPREHENSIVE METABOLIC PANEL
ALT: 23 U/L (ref 0–44)
AST: 16 U/L (ref 15–41)
Albumin: 3.6 g/dL (ref 3.5–5.0)
Alkaline Phosphatase: 119 U/L (ref 38–126)
Anion gap: 9 (ref 5–15)
BUN: 14 mg/dL (ref 8–23)
CO2: 26 mmol/L (ref 22–32)
Calcium: 8.7 mg/dL — ABNORMAL LOW (ref 8.9–10.3)
Chloride: 107 mmol/L (ref 98–111)
Creatinine, Ser: 0.86 mg/dL (ref 0.44–1.00)
GFR calc Af Amer: >60 mL/min (ref >60)
GFR calc non Af Amer: >60 mL/min (ref >60)
Glucose, Bld: 115 mg/dL — ABNORMAL HIGH (ref 70–99)
Potassium: 3.6 mmol/L (ref 3.5–5.1)
Sodium: 142 mmol/L (ref 135–145)
Total Bilirubin: 0.4 mg/dL (ref 0.3–1.2)
Total Protein: 7.2 g/dL (ref 6.5–8.1)

## 2020-01-05 LAB — CBC WITH DIFFERENTIAL/PLATELET
Abs Immature Granulocytes: 0.02 10*3/uL (ref 0.00–0.07)
Basophils Absolute: 0 10*3/uL (ref 0.0–0.1)
Basophils Relative: 1 %
Eosinophils Absolute: 0.2 10*3/uL (ref 0.0–0.5)
Eosinophils Relative: 3 %
HCT: 34.6 % — ABNORMAL LOW (ref 36.0–46.0)
Hemoglobin: 10.3 g/dL — ABNORMAL LOW (ref 12.0–15.0)
Immature Granulocytes: 0 %
Lymphocytes Relative: 31 %
Lymphs Abs: 1.8 10*3/uL (ref 0.7–4.0)
MCH: 23.9 pg — ABNORMAL LOW (ref 26.0–34.0)
MCHC: 29.8 g/dL — ABNORMAL LOW (ref 30.0–36.0)
MCV: 80.3 fL (ref 80.0–100.0)
Monocytes Absolute: 0.7 10*3/uL (ref 0.1–1.0)
Monocytes Relative: 12 %
Neutro Abs: 3.1 10*3/uL (ref 1.7–7.7)
Neutrophils Relative %: 53 %
Platelets: 198 10*3/uL (ref 150–400)
RBC: 4.31 MIL/uL (ref 3.87–5.11)
RDW: 17.2 % — ABNORMAL HIGH (ref 11.5–15.5)
WBC: 5.8 10*3/uL (ref 4.0–10.5)
nRBC: 0 % (ref 0.0–0.2)

## 2020-01-05 MED ORDER — HEPARIN SOD (PORK) LOCK FLUSH 100 UNIT/ML IV SOLN
500.0000 [IU] | Freq: Once | INTRAVENOUS | Status: AC
  Administered 2020-01-05: 500 [IU] via INTRAVENOUS
  Filled 2020-01-05: qty 5

## 2020-01-05 MED ORDER — SODIUM CHLORIDE 0.9% FLUSH
10.0000 mL | INTRAVENOUS | Status: DC | PRN
  Administered 2020-01-05: 09:00:00 10 mL via INTRAVENOUS
  Filled 2020-01-05: qty 10

## 2020-01-05 MED ORDER — FULVESTRANT 250 MG/5ML IM SOLN
INTRAMUSCULAR | Status: AC
  Filled 2020-01-05: qty 5

## 2020-01-05 MED ORDER — FULVESTRANT 250 MG/5ML IM SOLN
500.0000 mg | Freq: Once | INTRAMUSCULAR | Status: DC
  Administered 2020-01-05: 500 mg via INTRAMUSCULAR

## 2020-01-05 NOTE — Patient Instructions (Signed)
Fulvestrant injection What is this medicine? FULVESTRANT (ful VES trant) blocks the effects of estrogen. It is used to treat breast cancer. This medicine may be used for other purposes; ask your health care provider or pharmacist if you have questions. COMMON BRAND NAME(S): FASLODEX What should I tell my health care provider before I take this medicine? They need to know if you have any of these conditions:  bleeding disorders  liver disease  low blood counts, like low white cell, platelet, or red cell counts  an unusual or allergic reaction to fulvestrant, other medicines, foods, dyes, or preservatives  pregnant or trying to get pregnant  breast-feeding How should I use this medicine? This medicine is for injection into a muscle. It is usually given by a health care professional in a hospital or clinic setting. Talk to your pediatrician regarding the use of this medicine in children. Special care may be needed. Overdosage: If you think you have taken too much of this medicine contact a poison control center or emergency room at once. NOTE: This medicine is only for you. Do not share this medicine with others. What if I miss a dose? It is important not to miss your dose. Call your doctor or health care professional if you are unable to keep an appointment. What may interact with this medicine?  medicines that treat or prevent blood clots like warfarin, enoxaparin, dalteparin, apixaban, dabigatran, and rivaroxaban This list may not describe all possible interactions. Give your health care provider a list of all the medicines, herbs, non-prescription drugs, or dietary supplements you use. Also tell them if you smoke, drink alcohol, or use illegal drugs. Some items may interact with your medicine. What should I watch for while using this medicine? Your condition will be monitored carefully while you are receiving this medicine. You will need important blood work done while you are taking  this medicine. Do not become pregnant while taking this medicine or for at least 1 year after stopping it. Women of child-bearing potential will need to have a negative pregnancy test before starting this medicine. Women should inform their doctor if they wish to become pregnant or think they might be pregnant. There is a potential for serious side effects to an unborn child. Men should inform their doctors if they wish to father a child. This medicine may lower sperm counts. Talk to your health care professional or pharmacist for more information. Do not breast-feed an infant while taking this medicine or for 1 year after the last dose. What side effects may I notice from receiving this medicine? Side effects that you should report to your doctor or health care professional as soon as possible:  allergic reactions like skin rash, itching or hives, swelling of the face, lips, or tongue  feeling faint or lightheaded, falls  pain, tingling, numbness, or weakness in the legs  signs and symptoms of infection like fever or chills; cough; flu-like symptoms; sore throat  vaginal bleeding Side effects that usually do not require medical attention (report to your doctor or health care professional if they continue or are bothersome):  aches, pains  constipation  diarrhea  headache  hot flashes  nausea, vomiting  pain at site where injected  stomach pain This list may not describe all possible side effects. Call your doctor for medical advice about side effects. You may report side effects to FDA at 1-800-FDA-1088. Where should I keep my medicine? This drug is given in a hospital or clinic and will   not be stored at home. NOTE: This sheet is a summary. It may not cover all possible information. If you have questions about this medicine, talk to your doctor, pharmacist, or health care provider.  2020 Elsevier/Gold Standard (2018-02-05 11:34:41)  

## 2020-01-17 MED FILL — AMLODIPINE BESYLATE 10 MG T: 10 | 90 days supply | Qty: 90 | Fill #3

## 2020-02-01 ENCOUNTER — Other Ambulatory Visit: Payer: Medicare Other

## 2020-02-01 ENCOUNTER — Ambulatory Visit: Payer: Medicare Other

## 2020-02-01 ENCOUNTER — Ambulatory Visit: Payer: Medicare Other | Admitting: Adult Health

## 2020-02-02 ENCOUNTER — Inpatient Hospital Stay: Payer: Medicare Other

## 2020-02-02 ENCOUNTER — Other Ambulatory Visit: Payer: Self-pay

## 2020-02-02 ENCOUNTER — Inpatient Hospital Stay: Payer: Medicare Other | Attending: Oncology

## 2020-02-02 ENCOUNTER — Encounter: Payer: Self-pay | Admitting: Adult Health

## 2020-02-02 ENCOUNTER — Inpatient Hospital Stay (HOSPITAL_BASED_OUTPATIENT_CLINIC_OR_DEPARTMENT_OTHER): Payer: Medicare Other | Admitting: Adult Health

## 2020-02-02 VITALS — BP 144/50 | HR 101 | Temp 99.4°F | Resp 18 | Ht 66.0 in | Wt 212.6 lb

## 2020-02-02 DIAGNOSIS — C50811 Malignant neoplasm of overlapping sites of right female breast: Secondary | ICD-10-CM | POA: Insufficient documentation

## 2020-02-02 DIAGNOSIS — Z5111 Encounter for antineoplastic chemotherapy: Secondary | ICD-10-CM | POA: Diagnosis not present

## 2020-02-02 DIAGNOSIS — I69359 Hemiplegia and hemiparesis following cerebral infarction affecting unspecified side: Secondary | ICD-10-CM

## 2020-02-02 DIAGNOSIS — Z170 Estrogen receptor positive status [ER+]: Secondary | ICD-10-CM | POA: Diagnosis not present

## 2020-02-02 DIAGNOSIS — I6389 Other cerebral infarction: Secondary | ICD-10-CM | POA: Diagnosis not present

## 2020-02-02 DIAGNOSIS — C50911 Malignant neoplasm of unspecified site of right female breast: Secondary | ICD-10-CM

## 2020-02-02 DIAGNOSIS — I63132 Cerebral infarction due to embolism of left carotid artery: Secondary | ICD-10-CM

## 2020-02-02 DIAGNOSIS — Z95828 Presence of other vascular implants and grafts: Secondary | ICD-10-CM

## 2020-02-02 LAB — CBC WITH DIFFERENTIAL/PLATELET
Abs Immature Granulocytes: 0.02 10*3/uL (ref 0.00–0.07)
Basophils Absolute: 0.1 10*3/uL (ref 0.0–0.1)
Basophils Relative: 1 %
Eosinophils Absolute: 0.2 10*3/uL (ref 0.0–0.5)
Eosinophils Relative: 3 %
HCT: 34.2 % — ABNORMAL LOW (ref 36.0–46.0)
Hemoglobin: 10.2 g/dL — ABNORMAL LOW (ref 12.0–15.0)
Immature Granulocytes: 0 %
Lymphocytes Relative: 26 %
Lymphs Abs: 1.6 10*3/uL (ref 0.7–4.0)
MCH: 23.7 pg — ABNORMAL LOW (ref 26.0–34.0)
MCHC: 29.8 g/dL — ABNORMAL LOW (ref 30.0–36.0)
MCV: 79.4 fL — ABNORMAL LOW (ref 80.0–100.0)
Monocytes Absolute: 0.8 10*3/uL (ref 0.1–1.0)
Monocytes Relative: 13 %
Neutro Abs: 3.4 10*3/uL (ref 1.7–7.7)
Neutrophils Relative %: 57 %
Platelets: 207 10*3/uL (ref 150–400)
RBC: 4.31 MIL/uL (ref 3.87–5.11)
RDW: 17.2 % — ABNORMAL HIGH (ref 11.5–15.5)
WBC: 6 10*3/uL (ref 4.0–10.5)
nRBC: 0 % (ref 0.0–0.2)

## 2020-02-02 LAB — COMPREHENSIVE METABOLIC PANEL
ALT: 16 U/L (ref 0–44)
AST: 14 U/L — ABNORMAL LOW (ref 15–41)
Albumin: 3.5 g/dL (ref 3.5–5.0)
Alkaline Phosphatase: 109 U/L (ref 38–126)
Anion gap: 6 (ref 5–15)
BUN: 12 mg/dL (ref 8–23)
CO2: 27 mmol/L (ref 22–32)
Calcium: 8.9 mg/dL (ref 8.9–10.3)
Chloride: 107 mmol/L (ref 98–111)
Creatinine, Ser: 0.85 mg/dL (ref 0.44–1.00)
GFR calc Af Amer: >60 mL/min (ref >60)
GFR calc non Af Amer: >60 mL/min (ref >60)
Glucose, Bld: 155 mg/dL — ABNORMAL HIGH (ref 70–99)
Potassium: 3.2 mmol/L — ABNORMAL LOW (ref 3.5–5.1)
Sodium: 140 mmol/L (ref 135–145)
Total Bilirubin: 0.3 mg/dL (ref 0.3–1.2)
Total Protein: 6.9 g/dL (ref 6.5–8.1)

## 2020-02-02 MED ORDER — FULVESTRANT 250 MG/5ML IM SOLN
INTRAMUSCULAR | Status: AC
  Filled 2020-02-02: qty 10

## 2020-02-02 MED ORDER — HEPARIN SOD (PORK) LOCK FLUSH 100 UNIT/ML IV SOLN
500.0000 [IU] | Freq: Once | INTRAVENOUS | Status: AC
  Administered 2020-02-02: 500 [IU] via INTRAVENOUS
  Filled 2020-02-02: qty 5

## 2020-02-02 MED ORDER — MIRTAZAPINE 7.5 MG PO TABS: 7.5000 mg | ORAL_TABLET | Freq: Every day | ORAL | 4 refills | Status: DC

## 2020-02-02 MED ORDER — FULVESTRANT 250 MG/5ML IM SOLN
500.0000 mg | Freq: Once | INTRAMUSCULAR | Status: AC
  Administered 2020-02-02: 11:00:00 500 mg via INTRAMUSCULAR

## 2020-02-02 MED FILL — MIRTAZAPINE 7.5 MG TABLET: 7.5 | 90 days supply | Qty: 90 | Fill #0

## 2020-02-02 NOTE — Progress Notes (Signed)
Right breast [id number]

## 2020-02-02 NOTE — Addendum Note (Signed)
Addended by: Lenox Ponds E on: 02/02/2020 10:24 AM   Modules accepted: Orders, SmartSet

## 2020-02-02 NOTE — Progress Notes (Addendum)
[facility]  [phone] [phone]    ID: Laci Frenkel DOB: [birth year]  MR#: [id number]  ZHY#:[id number]  Patient Care Team: [name], MD as PCP - General (Internal Medicine) Waynetta Sandy, MD as Consulting Physician (Vascular Surgery) Patsey Berthold, NP as Nurse Practitioner (Cardiology) [name], MD as Consulting Physician (Physical Medicine and Rehabilitation) [name], [name], NP as Nurse Practitioner (Vascular Surgery) Edgardo Roys, PsyD as Consulting Physician (Psychology) Magrinat, [name], MD as Consulting Physician (Oncology) [name], MD as Consulting Physician (General Surgery) Eppie Gibson, MD as Attending Physician (Radiation Oncology) OTHER MD:   CHIEF COMPLAINT: Inflammatory breast cancer  CURRENT TREATMENT: Neoadjuvant treatment   INTERVAL HISTORY: [name] returns today for follow-up and treatment of her inflammatory breast cancer.  She is accompanied by her daughter [name].  She is receiving fulvestrant every 4 weeks.  She tolerates this well. Her daughter notes she does not believe it is producing the results we would like for her receiving this injection.   REVIEW OF SYSTEMS: [name] has increased her activity level to walking around a few times per day.  She is having increased difficulty sleeping.  She was previously on Mirtazapine at 7.'5mg'$  and this helped.  She denies any new issues like pain, fever, chills, headaches, nausea, vomiting, bowel/bladder changes, vision issues, cough, shortness of breath, palpitations, appetite decrease, or any other concerns.    HISTORY OF CURRENT ILLNESS: From the original intake note:  [name] [name] presented with right breast discoloration and redness. The right breast also had an area of hardness and nipple retraction.  She did not bring this to medical attention but her daughter did mention it when the patient had neurologic follow-up on  08/10/2018.  At that time the nurse practitioner, [name], noted right nipple inversion, hard breast, and red non-ulcerated lesions on the breast.  The patient then underwent bilateral diagnostic mammography with tomography and right breast ultrasonography at [facility] on 08/11/2018 (this is her first ever mammogram) showing: breast density category B. There was a round 2.5 cm mass at the 1 o'clock upper inner quadrant of the right breast.  By palpation this measured approximately 10 cm.  Sonographically the irregular hypoechoic mass measured 2.7 cm. There was also a hypoechoic mass in the right breast at 9 o'clock upper outer quadrant measuring 3.8 cm. There was an abnormal right axillary lymph node with cortical thickening.   Accordingly on 08/19/2018 she proceeded to biopsy of the right breast areas in question. The pathology from this procedure showed [id number]): At the 1 o'clock: invasive lobular carcinoma, grade II. Prognostic indicators significant for: estrogen receptor, 60% positive with moderate staining intensity and progesterone receptor, 0% negative. Proliferation marker Ki67 at 20%. HER2 negative with an immunohistochemistry of (1+).  At the 9 o'clock: invasive lobular carcinoma, grade II. Prognostic indicators significant for: estrogen receptor, 90% positive with strong staining intensity and progesterone receptor, 0% negative. Proliferation marker Ki67 at 30%. HER2 negative with an immunohistochemistry of (1+).  The patient's subsequent history is as detailed below.   PAST MEDICAL HISTORY: Past Medical History:  Diagnosis Date  . Anxiety   . Cancer [facility])    breast cancer- right  . Carotid stenosis   . Depression    situational  . Dyspnea    with much ambulation  . GERD (gastroesophageal reflux disease)   . Hypertension   . Stroke ([place]) 04/01/2017   a little weakness right side leg and hand, a little memry loss  PAST SURGICAL HISTORY: Past Surgical History:  Procedure  Laterality Date  . ABDOMINAL HYSTERECTOMY    . ENDARTERECTOMY Left 04/08/2017   Procedure: ENDARTERECTOMY CAROTID;  Surgeon: Waynetta Sandy, MD;  Location: [place];  Service: Vascular;  Laterality: Left;  . PATCH ANGIOPLASTY Left 04/08/2017   Procedure: PATCH ANGIOPLASTY Left Carotid;  Surgeon: Waynetta Sandy, MD;  Location: [facility];  Service: Vascular;  Laterality: Left;  . PORTACATH PLACEMENT Left 09/07/2018   Procedure: INSERTION PORT-A-CATH LEFT INTERNAL JUGULAR;  Surgeon: [name], MD;  Location: [place];  Service: General;  Laterality: Left;  total hysterectomy with bilateral salpingo-oophorectomy in her 90's no hormone replacement   FAMILY HISTORY: Family History  Problem Relation Age of Onset  . Cancer Mother        ovarian   The patient's father is alive at age 84. The patient's mother died at age 53 due to ovarian cancer. The patient had 3 brothers and 3 sisters. She denies a history of breast, pancreatic, colon, or other cancers in the family.    GYNECOLOGIC HISTORY:  No LMP recorded. Patient has had a hysterectomy. Menarche: Age at first live birth: 74 years old She is GX P1.  She is status post total hysterectomy with BSO in her early 20's. She did not use HRT.    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. She is divorced. At home is just herself, but her two grandsons regularly come to visit and stay with her. The patient's daughter, [name] works in administration for [employer]. The patient's 56 y.o. grandson is a Freight forwarder for [employer]. The patient's 80 y.o. grandson works in [employer].    ADVANCED DIRECTIVES: Not in place.  At the 08/26/2018 visit the patient was given the appropriate documents to complete and notarized at her discretion.   HEALTH MAINTENANCE: Social History   Tobacco Use  . Smoking status: Never Smoker  . Smokeless tobacco: Never Used  Substance Use Topics  . Alcohol use: No  . Drug  use: No     Colonoscopy: Never  PAP: Status post hysterectomy  Bone density: Never   No Known Allergies  Current Outpatient Medications  Medication Sig Dispense Refill  . amLODipine (NORVASC) 10 MG tablet Take 1 tablet (10 mg total) by mouth daily. PATIENT MUST HAVE OFFICE VISIT AND LABS PRIOR TO ANY FURTHER REFILLS 15 tablet 0  . atorvastatin (LIPITOR) 80 MG tablet Take 1 tablet (80 mg total) by mouth daily at 6 PM. 30 tablet 0  . clopidogrel (PLAVIX) 75 MG tablet TAKE 1 TABLET BY MOUTH EVERY DAY 30 tablet 1  . lisinopril-hydrochlorothiazide (PRINZIDE,ZESTORETIC) 20-12.5 MG tablet Take 1 tablet by mouth daily.  11  . loratadine (CLARITIN) 10 MG tablet Take 10 mg by mouth daily as needed for allergies.    . potassium chloride SA (K-DUR,KLOR-CON) 20 MEQ tablet Take 1 tablet (20 mEq total) by mouth daily. 30 tablet 0   No current facility-administered medications for this visit.    OBJECTIVE:  Vitals:   02/02/20 0943  BP: (!) 144/50  Pulse: (!) 101  Resp: 18  Temp: 99.4 F (37.4 C)  SpO2: 100%   Wt Readings from Last 3 Encounters:  02/02/20 212 lb 9.6 oz (96.4 kg)  12/07/19 205 lb 1.6 oz (93 kg)  11/10/19 206 lb (93.4 kg)   Body mass index is 34.31 kg/m.    ECOG FS:2 - Symptomatic, <50% confined to bed GENERAL: Patient  is a well appearing female in no acute distress; examined in chair HEENT:  Sclerae anicteric. Mask in place. Neck is supple.  NODES:  No cervical, supraclavicular, or axillary lymphadenopathy palpated.  BREAST EXAM:  See picture below LUNGS:  Clear to auscultation bilaterally.  No wheezes or rhonchi. HEART:  Regular rate and rhythm. No murmur appreciated. ABDOMEN:  Soft, nontender.  Positive, normoactive bowel sounds.  MSK:  No focal spinal tenderness to palpation. EXTREMITIES:  No peripheral edema.   SKIN:  Clear with no obvious rashes or skin changes. No nail dyscrasia. NEURO:  Nonfocal. Well oriented.  Appropriate affect.  Right breast  02/02/2020    Right breast 11/10/2019     Right breast 08/12/2019    Right Breast 04/07/2019   LAB RESULTS:  CMP     Component Value Date/Time   NA 140 02/02/2020 0925   K 3.2 (L) 02/02/2020 0925   CL 107 02/02/2020 0925   CO2 27 02/02/2020 0925   GLUCOSE 155 (H) 02/02/2020 0925   BUN 12 02/02/2020 0925   CREATININE 0.85 02/02/2020 0925   CREATININE 1.11 (H) 11/03/2018 1017   CALCIUM 8.9 02/02/2020 0925   PROT 6.9 02/02/2020 0925   ALBUMIN 3.5 02/02/2020 0925   AST 14 (L) 02/02/2020 0925   AST 32 11/03/2018 1017   ALT 16 02/02/2020 0925   ALT 48 (H) 11/03/2018 1017   ALKPHOS 109 02/02/2020 0925   BILITOT 0.3 02/02/2020 0925   BILITOT 0.5 11/03/2018 1017   GFRNONAA >60 02/02/2020 0925   GFRNONAA 50 (L) 11/03/2018 1017   GFRAA >60 02/02/2020 0925   GFRAA 58 (L) 11/03/2018 1017    No results found for: TOTALPROTELP, ALBUMINELP, A1GS, A2GS, BETS, BETA2SER, GAMS, MSPIKE, SPEI  No results found for: KPAFRELGTCHN, LAMBDASER, KAPLAMBRATIO  Lab Results  Component Value Date   WBC 6.0 02/02/2020   NEUTROABS 3.4 02/02/2020   HGB 10.2 (L) 02/02/2020   HCT 34.2 (L) 02/02/2020   MCV 79.4 (L) 02/02/2020   PLT 207 02/02/2020    No results found for: LABCA2  No components found for: PHXTAV697  No results for input(s): INR in the last 168 hours.  No results found for: LABCA2  No results found for: XYI016  No results found for: PVV748  No results found for: OLM786  Lab Results  Component Value Date   CA2729 78.6 (H) 08/18/2019    No components found for: HGQUANT  No results found for: CEA1 / No results found for: CEA1   No results found for: AFPTUMOR  No results found for: CHROMOGRNA  No results found for: HGBA, HGBA2QUANT, HGBFQUANT, HGBSQUAN (Hemoglobinopathy evaluation)   No results found for: LDH  No results found for: IRON, TIBC, IRONPCTSAT (Iron and TIBC)  No results found for: FERRITIN  Urinalysis    Component Value Date/Time    COLORURINE AMBER (A) 04/01/2017 0920   APPEARANCEUR CLOUDY (A) 04/01/2017 0920   LABSPEC 1.021 04/01/2017 0920   PHURINE 5.0 04/01/2017 0920   GLUCOSEU NEGATIVE 04/01/2017 0920   HGBUR MODERATE (A) 04/01/2017 0920   BILIRUBINUR NEGATIVE 04/01/2017 0920   KETONESUR 80 (A) 04/01/2017 0920   PROTEINUR NEGATIVE 04/01/2017 0920   NITRITE NEGATIVE 04/01/2017 0920   LEUKOCYTESUR LARGE (A) 04/01/2017 0920     STUDIES: No results found.  ELIGIBLE FOR AVAILABLE RESEARCH PROTOCOL: no   ASSESSMENT: 74 y.o. [place], [place] woman status post right breast biopsy of overlapping sites on 08/19/2018 for a clinical T4 N1, stage IIIB invasive lobular carcinoma, estrogen receptor  positive, progesterone receptor negative, HER-2 not amplified, with an MIB-1 of 20%.  (a) CT chest/abd/pelvis 09/02/2018 and bone scan 09/10/2018 show no evidence of stage IV disease  (b) breast MRI 09/14/2018 confirms a multicentric 12.6 cm right breast mass infiltrating pectpralis but w/o associated adenopathy  (1) genetics testing to be scheduled  (2) neoadjuvant chemotherapy consisting of cyclophosphamide, methotrexate and fluorouracil every 21 days x 8, started 09/16/2018, last dose 12/18/2018  (a) CMF chemotherapy stopped after 4 cycles, with no evidence of response by MRI NOV 2019  (5) anastrozole started February 2019  (a) bone density to be obtained  (b) palbociclib added 01/19/2019 at 125 mg daily, 21/7  (c) palbociclib dose decreased to 100 mg daily 21 on 7 off, starting with second cycle, April 2020  (d) breast MRI 05/05/2019 shows slight improvement, no evidence of progression, however the tumor is not yet resectable.  Chest CT scan same day shows no evidence of metastatic disease  (e) anastrozole and palbociclib discontinued 08/12/2019 with no significant response  (6) foundation 1/PD-L1 testing:  (a) PD-L1 requested 12/18/2018--insufficient tissue  (b) foundation one testing shows the microsatellite status to  be stable, and the mutational burden to be 16/Mb  (c) there are potentially actionable mutations in AKT3, TSC1, KRAS  MAPK1 and RB1  (7) started paclitaxel 08/18/2019, given days 1 and 8 of each 21-28-day cycle  (a) changed to every 14 days beginning with second cycle per patient preference  (b) discontinued after 10/27/2019 dose with no evidence of response (minimal evidence of progression)  (8) fulvestrant started 11/10/2019.  (a) discontinued after 02/02/2020 dose as no evidence of response  (9) definitive surgery pending  (10) adjuvant radiation to follow   PLAN: Larcenia and her daughter [name] met with Dr. Jana Hakim today as well, due to the fact that she has no evidence of response to the Fulvestrant.  He sat down with them to review her options.    The first option is Exemestane and Everolimus.  This is an oral anti estrogen therapy (exemestane) with an oral antineoplastic (Everolimus).  These pills are taken daily.  The side effects that may happen with these medications include hormonal from the exemestane such as hot flashes, arthralgias, vaginal dryness, or related to the antineoplastic such as pneumonitis, mucositis, or cytopenias.    The second option is radiation therapy to decrease the lesion, dry it up, and prevent it from eroding the skin and draining, which is quite unpleasant.    The third option is to do nothing. At this point, she does not want to do anything.  She is going to think about this further and will see Dr. Jana Hakim in 4 weeks for a my chart video visit.  She did have her port flush and she did receive the fulvestrant today.    She will restart Mirtazapine and I sent this in to her pharmacy today.  We also discussed palliative care with [facility].  We placed that referral today.    She was recommended to continue with the appropriate pandemic precautions. She knows to call for any questions that may arise between now and her next appointment.  We are happy to see  her sooner if needed.  Total encounter time 20 minutes.Wilber Bihari, NP 02/02/20 12:37 PM Medical Oncology and Hematology [facility] [place], [place] [address] Tel. [phone]    Fax. [phone]   ADDENDUM: Jessicaann has been on fulvestrant for 3 months.  She was very hesitant  to receive that treatment and really her preference has always been just "be left alone" but she did agree to it and she was compliant with it.  Unfortunately it has not resulted in any response in fact there is evidence of slight progression.  We could go to exemestane and everolimus, we could try a different chemotherapy agent, perhaps capecitabine at metronomic doses and the third option is radiation, palliatively, just to "dry up" the lesions on the breast.    What they are interested in is observation only with consideration of hospice as the patient's condition declines.  They understand that I do not have clear evidence that her risk of dying in the next 6 months is at least 50%.  Her cancer has been very slow-growing, localized to the breast, and I do not have evidence of metastatic spread at this point  What Lillis wants to do is just basically stay home.  I am going to speak with her/and her daughter in about a month just to make sure they are comfortable with that decision and if so then we will see if they want to see me in person 3 months later or if they would just like to continue video visits until her condition declines  It would be important to make sure we clarify advanced directives at that time.   I personally saw this patient and performed a substantive portion of this encounter with the listed APP documented above.   Total encounter time 30 minutes  Chauncey Cruel, MD Medical Oncology and Hematology [facility] [address] [place], [place] [address] Tel. [phone]    Fax. [phone]   *Total Encounter Time as defined by  the Centers for Medicare and Medicaid Services includes, in addition to the face-to-face time of a patient visit (documented in the note above) non-face-to-face time: obtaining and reviewing outside history, ordering and reviewing medications, tests or procedures, care coordination (communications with other health care professionals or caregivers) and documentation in the medical record.

## 2020-02-02 NOTE — Patient Instructions (Signed)
Fulvestrant injection What is this medicine? FULVESTRANT (ful VES trant) blocks the effects of estrogen. It is used to treat breast cancer. This medicine may be used for other purposes; ask your health care provider or pharmacist if you have questions. COMMON BRAND NAME(S): FASLODEX What should I tell my health care provider before I take this medicine? They need to know if you have any of these conditions:  bleeding disorders  liver disease  low blood counts, like low white cell, platelet, or red cell counts  an unusual or allergic reaction to fulvestrant, other medicines, foods, dyes, or preservatives  pregnant or trying to get pregnant  breast-feeding How should I use this medicine? This medicine is for injection into a muscle. It is usually given by a health care professional in a hospital or clinic setting. Talk to your pediatrician regarding the use of this medicine in children. Special care may be needed. Overdosage: If you think you have taken too much of this medicine contact a poison control center or emergency room at once. NOTE: This medicine is only for you. Do not share this medicine with others. What if I miss a dose? It is important not to miss your dose. Call your doctor or health care professional if you are unable to keep an appointment. What may interact with this medicine?  medicines that treat or prevent blood clots like warfarin, enoxaparin, dalteparin, apixaban, dabigatran, and rivaroxaban This list may not describe all possible interactions. Give your health care provider a list of all the medicines, herbs, non-prescription drugs, or dietary supplements you use. Also tell them if you smoke, drink alcohol, or use illegal drugs. Some items may interact with your medicine. What should I watch for while using this medicine? Your condition will be monitored carefully while you are receiving this medicine. You will need important blood work done while you are taking  this medicine. Do not become pregnant while taking this medicine or for at least 1 year after stopping it. Women of child-bearing potential will need to have a negative pregnancy test before starting this medicine. Women should inform their doctor if they wish to become pregnant or think they might be pregnant. There is a potential for serious side effects to an unborn child. Men should inform their doctors if they wish to father a child. This medicine may lower sperm counts. Talk to your health care professional or pharmacist for more information. Do not breast-feed an infant while taking this medicine or for 1 year after the last dose. What side effects may I notice from receiving this medicine? Side effects that you should report to your doctor or health care professional as soon as possible:  allergic reactions like skin rash, itching or hives, swelling of the face, lips, or tongue  feeling faint or lightheaded, falls  pain, tingling, numbness, or weakness in the legs  signs and symptoms of infection like fever or chills; cough; flu-like symptoms; sore throat  vaginal bleeding Side effects that usually do not require medical attention (report to your doctor or health care professional if they continue or are bothersome):  aches, pains  constipation  diarrhea  headache  hot flashes  nausea, vomiting  pain at site where injected  stomach pain This list may not describe all possible side effects. Call your doctor for medical advice about side effects. You may report side effects to FDA at 1-800-FDA-1088. Where should I keep my medicine? This drug is given in a hospital or clinic and will   not be stored at home. NOTE: This sheet is a summary. It may not cover all possible information. If you have questions about this medicine, talk to your doctor, pharmacist, or health care provider.  2020 Elsevier/Gold Standard (2018-02-05 11:34:41)  

## 2020-02-03 ENCOUNTER — Telehealth: Payer: Self-pay | Admitting: Adult Health

## 2020-02-03 NOTE — Telephone Encounter (Signed)
Scheduled appts per 3/24 los. [name] confirmed appt date and time.

## 2020-02-16 MED FILL — ATORVASTATIN 80 MG TABLET: 80 | 14 days supply | Qty: 14 | Fill #0

## 2020-02-16 MED FILL — CLOPIDOGREL 75 MG TABLET: 75 | 14 days supply | Qty: 14 | Fill #0

## 2020-02-23 ENCOUNTER — Telehealth: Payer: Self-pay

## 2020-02-23 DIAGNOSIS — C50811 Malignant neoplasm of overlapping sites of right female breast: Secondary | ICD-10-CM

## 2020-02-23 NOTE — Telephone Encounter (Signed)
[name] called to inquire about radiation recommendations, and how long patient would be expected to have radiation.    RN reviewed with MD - MD recommendations for patient/daughter to be set up for radiation consult prior to follow up apt with him on 4/21.  [name] notified and voiced agreement.  Consult/referral placed.

## 2020-02-29 ENCOUNTER — Other Ambulatory Visit: Payer: Medicare Other

## 2020-02-29 ENCOUNTER — Ambulatory Visit: Payer: Medicare Other

## 2020-02-29 NOTE — Progress Notes (Signed)
Location of Breast Cancer: Malignant neoplasm of overlapping sites of RIGHT breast, estrogen receptor positive   Histology per Pathology Report: 08/19/2018 Diagnosis 1. Breast, right, needle core biopsy, 1 o'clock, 7cmn - INVASIVE MAMMARY CARCINOMA. 2. Breast, right, needle core biopsy, 9 o'clock, 2cmn - INVASIVE MAMMARY CARCINOMA. Microscopic Comment 1. The carcinoma appears grade 2.  Receptor Status: ER(60%), PR (0%), Her2-neu (negative), Ki-67(20%)  Did patient present with symptoms (if so, please note symptoms) or was this found on screening mammography?:  Patient presented with right breast discoloration and redness. The right breast also had an area of hardness and nipple retraction.  She did not bring this to medical attention but her daughter did mention it when the patient had neurologic follow-up on 08/10/2018.  At that time the nurse practitioner, [name], noted right nipple inversion, hard breast, and red non-ulcerated lesions on the breast.  The patient then underwent bilateral diagnostic mammography with tomography and right breast ultrasonography at [facility] on 08/11/2018 (this is her first ever mammogram) showing: breast density category B.   Past/Anticipated interventions by surgeon, if any: None  Past/Anticipated interventions by medical oncology, if any:  Under care of Dr. Sarajane Jews Magrinat: (1) neoadjuvant chemotherapy consisting of cyclophosphamide, methotrexate and fluorouracil every 21 days x 8, started 09/16/2018, last dose 12/18/2018             (a) CMF chemotherapy stopped after 4 cycles, with no evidence of response by MRI NOV 2019 (2) anastrozole started February 2019             (a) bone density to be obtained             (b) palbociclib added 01/19/2019 at 125 mg daily, 21/7             (c) palbociclib dose decreased to 100 mg daily 21 on 7 off, starting with second cycle, April 2020             (d) breast MRI 05/05/2019 shows slight improvement, no  evidence of progression, however the tumor is not yet resectable.  Chest CT scan same day shows no evidence of metastatic disease             (e) anastrozole and palbociclib discontinued 08/12/2019 with no significant response (3) foundation 1/PD-L1 testing:             (a) PD-L1 requested 12/18/2018--insufficient tissue             (b) foundation one testing shows the microsatellite status to be stable, and the mutational burden to be 16/Mb             (c) there are potentially actionable mutations in AKT3, TSC1, KRAS  MAPK1 and RB1 (4) started paclitaxel 08/18/2019, given days 1 and 8 of each 21-28-day cycle             (a) changed to every 14 days beginning with second cycle per patient preference             (b) discontinued after 10/27/2019 dose with no evidence of response (minimal evidence of progression) (5) fulvestrant started 11/10/2019.             (a) discontinued after 02/02/2020 dose as no evidence of response (6) definitive surgery pending (7) adjuvant radiation to follow  Lymphedema issues, if any:  Patient states right breast is significantly more swollen than left.    Pain issues, if any:  None   SAFETY ISSUES:  Prior radiation? No  Pacemaker/ICD?  No  Possible current pregnancy? No  Is the patient on methotrexate? No  Current Complaints / other details:   Per Dr. Jana Hakim: Patient's preference has always been just "be left alone". What they are interested in is observation only with consideration of hospice as the patient's condition declines.     Zola Button, RN 02/29/2020,3:27 PM

## 2020-03-01 ENCOUNTER — Other Ambulatory Visit: Payer: Medicare Other

## 2020-03-01 ENCOUNTER — Inpatient Hospital Stay: Payer: Medicare Other | Admitting: Oncology

## 2020-03-01 ENCOUNTER — Ambulatory Visit: Payer: Medicare Other

## 2020-03-02 NOTE — Progress Notes (Signed)
Radiation Oncology         [phone]) [phone] ________________________________  Name: [name] [name] MRN: [id number]  Date: 03/03/2020  DOB: May 08, 1946  Follow-Up Visit Note - The patient opted for telemedicine to maximize safety during the pandemic.  MyChart video was used   Outpatient  CC: [name], MD  Magrinat, [name], MD  Diagnosis:      ICD-10-CM   1. Malignant neoplasm of overlapping sites of right breast in female, estrogen receptor positive ([place])  C50.811    Z17.0      Cancer Staging Malignant neoplasm of overlapping sites of right breast in female, estrogen receptor positive ([place]) Staging form: Breast, AJCC 8th Edition - Clinical stage from 08/26/2018: Stage IIIB (cT4b, cN1, cM0, G2, ER+, PR-, HER2-) - Unsigned   CHIEF COMPLAINT: Here to discuss management of right breast cancer  Narrative:  The patient returns today for follow-up to discuss radiation treatment options. She was seen in the multidisciplinary breast clinic on 08/26/2018.    Breast MRI performed on 09/14/2018 showed: multicentric 12.6 cm right breast mass infiltrating pectoralis muscle; no associated adenopathy.   She was treated with several different kinds of systemic therapy by Dr. Jana Hakim, all of which were stopped due to minimal response: 4 cycles of CMF from 09/16/2018 through 12/18/2018; anastrozole from 12/2018 and palbociclib from 01/19/2019 until 08/12/2019; paclitaxel from 08/18/2019 through 10/27/2019; fulvestrant from 11/10/2019 through 02/02/2020.  CT scan of her chest on November 03, 2019 showed stable exam with no evidence of metastatic disease.  There is a similar appearance of the infiltrating tumor with skin thickening in the right breast.  No axillary adenopathy.  I have personally reviewed her images.  Symptomatically, the patient reports: That her right breast is more swollen than the left.  She is trying to strike a balance between avoiding side effects that affect her quality of life but  also trying to control her cancer.  She is trying to figure out how aggressive she would like to be with her treatment.  I have reviewed the medical oncology notes.  Photograph of her right breast from 02/02/2020 demonstrates diffuse edema with skin changes including erythema and blistering as well as partial nipple inversion        ALLERGIES:  has No Known Allergies.  Meds: Current Outpatient Medications  Medication Sig Dispense Refill  . amLODipine (NORVASC) 10 MG tablet Take 1 tablet (10 mg total) by mouth daily. PATIENT MUST HAVE OFFICE VISIT AND LABS PRIOR TO ANY FURTHER REFILLS 15 tablet 0  . atorvastatin (LIPITOR) 80 MG tablet Take 1 tablet (80 mg total) by mouth daily at 6 PM. 30 tablet 0  . clopidogrel (PLAVIX) 75 MG tablet TAKE 1 TABLET BY MOUTH EVERY DAY 30 tablet 1  . lisinopril-hydrochlorothiazide (PRINZIDE,ZESTORETIC) 20-12.5 MG tablet Take 1 tablet by mouth daily.  11  . loratadine (CLARITIN) 10 MG tablet Take 10 mg by mouth daily as needed for allergies.    . mirtazapine (REMERON) 7.5 MG tablet Take 1 tablet (7.5 mg total) by mouth at bedtime. (Patient not taking: Reported on 03/03/2020) 90 tablet 4  . potassium chloride SA (K-DUR,KLOR-CON) 20 MEQ tablet Take 1 tablet (20 mEq total) by mouth daily. (Patient not taking: Reported on 03/03/2020) 30 tablet 0   No current facility-administered medications for this encounter.    Physical Findings:  vitals were not taken for this visit. .     General: Alert and oriented, in no acute distress See notes above regarding breast photograph reviewed  from medical oncology    Lab Findings: Lab Results  Component Value Date   WBC 6.0 02/02/2020   HGB 10.2 (L) 02/02/2020   HCT 34.2 (L) 02/02/2020   MCV 79.4 (L) 02/02/2020   PLT 207 02/02/2020    Radiographic Findings: As above  Impression/Plan: Right Breast Cancer  Unfortunately the patient has locally advanced disease in the right breast that has been refractory to neoadjuvant  chemotherapy/systemic therapy.  Previously she was not felt to be a surgical candidate.  The systemic therapy has not rendered any significant results to facilitate surgery.  Today I spoke with the patient and her daughter about potential palliative courses of radiotherapy to the right breast.  Palliative radiotherapy could be given over 1 to 6 weeks with hypofractionated versus standard fractionated methods.  She and her daughter understand that spreading the treatment over several weeks may increase the efficacy in terms of local regional control of her cancer.  She understands that her cancer is not likely to render a complete response to radiotherapy ; more realistically, the radiotherapy will halt the growth of the cancer for a certain period of time or shrink the cancer for certain period of time.  I recommended that they consider at least 3 weeks of radiotherapy as I believe 3 weeks of hypofractionated treatment would be significantly more likely to procure durable control than a 1 week regimen.    We spoke about the risks benefits and side effects of palliative radiotherapy.  They understand that side effects may include but not necessarily be limited to skin irritation, skin desquamation, fatigue, tenderness and swelling in the breast, cosmetic changes to the breast.  Special effort would be taken to minimize exposure to her internal organs.   They understand that I would like to speak with the rest of her oncologic team before decision is made.  I want to make sure that surgery is not recommended at this time.   In the meantime the patient and her daughter will think about their options.  They understand that another option is best supportive care.  I will get back to them once I hear back from the rest of her team.    This encounter was provided by telemedicine platform MyChart video The patient has given verbal consent for this type of encounter and has been advised to only accept a meeting of  this type in a secure network environment. On date of service, in total, I spent 50 minutes on this encounter. The attendants for this meeting include Eppie Gibson  and Webb Laws.  During the encounter, Eppie Gibson was located at [facility] Radiation Oncology Department.  Aleecia Tapia was located at home.   _____________________________________   Eppie Gibson, MD   This document serves as a record of services personally performed by Eppie Gibson, MD. It was created on her behalf by Wilburn Mylar, a trained medical scribe. The creation of this record is based on the scribe's personal observations and the provider's statements to them. This document has been checked and approved by the attending provider.

## 2020-03-03 ENCOUNTER — Other Ambulatory Visit: Payer: Self-pay

## 2020-03-03 ENCOUNTER — Encounter: Payer: Self-pay | Admitting: Radiation Oncology

## 2020-03-03 ENCOUNTER — Ambulatory Visit
Admission: RE | Admit: 2020-03-03 | Discharge: 2020-03-03 | Disposition: A | Discharge status: Home / Self Care | Payer: Medicare Other | Source: Ambulatory Visit | Attending: Radiation Oncology | Admitting: Radiation Oncology

## 2020-03-03 DIAGNOSIS — C50811 Malignant neoplasm of overlapping sites of right female breast: Secondary | ICD-10-CM

## 2020-03-03 DIAGNOSIS — Z170 Estrogen receptor positive status [ER+]: Secondary | ICD-10-CM

## 2020-03-06 ENCOUNTER — Telehealth: Payer: Self-pay | Admitting: Oncology

## 2020-03-06 ENCOUNTER — Other Ambulatory Visit: Payer: Self-pay | Admitting: Radiation Therapy

## 2020-03-06 ENCOUNTER — Encounter: Payer: Self-pay | Admitting: Radiation Oncology

## 2020-03-06 NOTE — Telephone Encounter (Signed)
Called pt per 4/26 sch message -unable to reach pt . Left message with new date and time

## 2020-03-07 ENCOUNTER — Inpatient Hospital Stay: Payer: Medicare Other | Attending: Oncology

## 2020-03-07 MED FILL — ATORVASTATIN 80 MG TABLET: 80 | 90 days supply | Qty: 90 | Fill #0

## 2020-03-07 MED FILL — LISINOPRIL-HCTZ 20-12.5 MG: 20-12.5 | 90 days supply | Qty: 90 | Fill #0

## 2020-03-07 MED FILL — CLOPIDOGREL 75 MG TABLET: 75 | 90 days supply | Qty: 90 | Fill #0

## 2020-03-07 MED FILL — AMLODIPINE BESYLATE 10 MG T: 10 | 90 days supply | Qty: 90 | Fill #0

## 2020-03-08 DIAGNOSIS — H25813 Combined forms of age-related cataract, bilateral: Secondary | ICD-10-CM | POA: Diagnosis not present

## 2020-03-08 DIAGNOSIS — H401131 Primary open-angle glaucoma, bilateral, mild stage: Secondary | ICD-10-CM | POA: Diagnosis not present

## 2020-03-08 MED FILL — LATANOPROST 0.005% OPTH SOL: 0.005 | 25 days supply | Qty: 3 | Fill #0

## 2020-03-09 ENCOUNTER — Inpatient Hospital Stay: Payer: Medicare Other | Admitting: Oncology

## 2020-03-09 ENCOUNTER — Other Ambulatory Visit (HOSPITAL_COMMUNITY): Payer: Self-pay | Admitting: Internal Medicine

## 2020-03-09 DIAGNOSIS — I639 Cerebral infarction, unspecified: Secondary | ICD-10-CM | POA: Diagnosis not present

## 2020-03-09 DIAGNOSIS — I10 Essential (primary) hypertension: Secondary | ICD-10-CM | POA: Diagnosis not present

## 2020-03-09 DIAGNOSIS — I6522 Occlusion and stenosis of left carotid artery: Secondary | ICD-10-CM | POA: Diagnosis not present

## 2020-03-09 DIAGNOSIS — C50919 Malignant neoplasm of unspecified site of unspecified female breast: Secondary | ICD-10-CM | POA: Diagnosis not present

## 2020-03-09 DIAGNOSIS — E7800 Pure hypercholesterolemia, unspecified: Secondary | ICD-10-CM | POA: Diagnosis not present

## 2020-03-10 ENCOUNTER — Ambulatory Visit: Payer: Medicare Other | Admitting: Podiatry

## 2020-03-14 ENCOUNTER — Telehealth: Payer: Self-pay

## 2020-03-14 NOTE — Telephone Encounter (Signed)
Returned call to patient's daughter [name] [name] to convey responses from Dr. Isidore Moos. [name] [name] verbalized understanding and agreement, but still wanted to know: "Why only 3 weeks of radiation? Does that limit the projected growth of the tumor and mean mother won't need to have any more chemo? Basically I want to make sure going through radiation is worth the potential side effects, and will help mother. If it's not going to provide any benefit for mother then I don't think we'll do it." Responded that unfortunately I could not answer those concerns directly, but that I would pass along her questions to both Dr. Isidore Moos and Dr. Jana Hakim (since he would need to weigh-in in regards to the systemic treatment recommendations). [name] [name] stated she would wait to hear from either provider and then make the final decision about whether or not her mother would be pursuing radiation. Provided my direct number for [name] [name] to call should she have any other questions/concerns. No other needs identified at this time.

## 2020-03-14 NOTE — Telephone Encounter (Signed)
-----   Message from Eppie Gibson, MD sent at 03/13/2020  3:00 PM EDT ----- Regarding: RE: Agreeable to Start Tx Thanks! I'll schedule simulation. Can you give this info to her?  1) Any further treatments are optional after RT; RT does not mandate that she endure anything further.  2) She will lie down on a table for 5-10 min a day but the beam will be on for a min or so per day - most of the time is for set-up. She'll get treated on weekdays, not weekends, for no more than a three week period.  No more RT is mandated in the future.  3) RT does not cause the cancer to spread or metastasize.  It is possible that as the tumor responds, her skin will  weep where the tumor has disrupted the integrity of her skin. But this does not cause progression of the cancer.    If she has more questions, ask her to write them down and we can discuss before we sign consent at sim. ----- Message ----- From: Zola Button, RN Sent: 03/13/2020   9:45 AM EDT To: Eppie Gibson, MD Subject: Agreeable to Start Tx                          Patient's daughter [name] called to say patient is agreeable to 3 weeks of radiation only. She did have a few more questions if you have time to call [name] [phone] (or you can tell me your responses and I can call her)--I did my best to summarize them  1) If patient does radiation, will there be any other treatments she has to endure? Will she have to continue with monthly follow-ups with medical oncology? Will she be on any medications after radiation? 2) How long will the radiation last? Once she's completed the 3 weeks does that mean she won't have to come back for another 3-5 years for another form of radiation? 3) Will radiation aggravate the tumor? The tumor is currently contained, will radiation cause it to show up somewhere else?  Thanks,  [employer]

## 2020-03-17 ENCOUNTER — Telehealth: Payer: Self-pay | Admitting: *Deleted

## 2020-03-17 ENCOUNTER — Telehealth: Payer: Self-pay

## 2020-03-17 NOTE — Telephone Encounter (Signed)
Received VM from patient's daughter [name] that patient has decided she does not wish to pursue radiation. [name] did state that patient was still interested in getting signed up for [employer] COVID-19 vaccine and would like either Mon, Tue, or Wed of next week.  Called [name] back to see if there was a time of day that the patient would prefer, but didn't not get an answer. Left a VM saying I would sign patient up for 9 am on Tues 03/21/20 but if she wanted a later time or a different date to call me back and I would adjust accordingly.   Will route message to Dr. Isidore Moos to make her aware of patient's desire not to undergo radiation.

## 2020-03-17 NOTE — Telephone Encounter (Signed)
VM left by the patient's daughter - [name] - stating her mother has decided not do radiation but would like to proceed with " the estrogen therapy ".  This RN called [name] and discussed above - they are scheduled for a virtual visit on 03/27/2020-   Informed her above message would be given to MD.

## 2020-03-20 NOTE — Telephone Encounter (Signed)
Thank you for seeing her  [name] [name]

## 2020-03-21 ENCOUNTER — Ambulatory Visit: Payer: Medicare Other | Attending: Internal Medicine

## 2020-03-21 DIAGNOSIS — Z23 Encounter for immunization: Secondary | ICD-10-CM

## 2020-03-21 NOTE — Progress Notes (Signed)
   Covid-19 Vaccination Clinic  Name:  [name] [name]    MRN: [id number] DOB: Jun 29, 1946  03/21/2020  [name] was observed post Covid-19 immunization for 15 minutes without incident. She was provided with Vaccine Information Sheet and instruction to access the V-Safe system.   [name] was instructed to call 911 with any severe reactions post vaccine: Marland Kitchen Difficulty breathing  . Swelling of face and throat  . A fast heartbeat  . A bad rash all over body  . Dizziness and weakness   Immunizations Administered    Name Date Dose VIS Date Route   Pfizer COVID-19 Vaccine 03/21/2020  9:06 AM 0.3 mL 01/05/2019 Intramuscular   Manufacturer: [employer], [employer]   Lot: [id number]   [place]: [id number]

## 2020-03-22 ENCOUNTER — Telehealth: Payer: Self-pay

## 2020-03-22 NOTE — Telephone Encounter (Signed)
Received VM from patient's daughter [name] inquiring about a bill for $101. She was worried it was for patient's first COVID-19 vaccine received yesterday, and wanted to see if I could see the bill in patient's chart.   Returned [name]'s call to inform her that I wasn't able to see the bill in patient's chart, but if she wanted to call the billing department directly they should be able to answer the matter for her. Provided number [phone]) and encouraged [name] or patient to call back should any other needs/concerns arise. [name] verbalized understanding and appreciation for call.

## 2020-03-26 NOTE — Progress Notes (Signed)
[place]  [phone] [phone]    ID: [name] [name] DOB: 06/29/1946  MR#: [id number]  RSW#:[id number]  Patient Care Team: [name] Carol, MD as PCP - General (Internal Medicine) [name] Sandy, MD as Consulting Physician (Vascular Surgery) [name] Berthold, NP as Nurse Practitioner (Cardiology) [name] Staggers, MD as Consulting Physician (Physical Medicine and Rehabilitation) [name], [name] Leyden, NP as Nurse Practitioner (Vascular Surgery) [name] Roys, PsyD as Consulting Physician (Psychology) [name] [name], [name] Dad, MD as Consulting Physician (Oncology) [name] Kussmaul, MD as Consulting Physician (General Surgery) [name] Gibson, MD as Attending Physician (Radiation Oncology) OTHER MD:  I connected with [name] [name] on 03/27/20 at  9:30 AM EDT by telephone visit and verified that I am speaking with the correct person using two identifiers.   I discussed the limitations, risks, security and privacy concerns of performing an evaluation and management service by telemedicine and the availability of in-person appointments. I also discussed with the patient that there may be a patient responsible charge related to this service. The patient expressed understanding and agreed to proceed.   Other persons participating in the visit and their role in the encounter: Patient's daughter [name] [name]  Patient's location: home  Provider's location: [place]    CHIEF COMPLAINT: Inflammatory breast cancer  CURRENT TREATMENT: To start exemestane/everolimus   INTERVAL HISTORY: [name] [name] was contacted today for follow-up of her inflammatory breast cancer.  Since her last visit, she met with Dr. Isidore [name] to discuss radiation therapy. Ultimately, the patient and her daughter decided not to pursue radiation at this time.  The patient tells me after considering it she felt she might not be able to tolerate it very well  Her daughter [name] [name]  contacted our office on 03/17/2020 to let us know they would like to proceed with antiestrogens.   REVIEW OF SYSTEMS: [name] [name] tells me she is doing well.  She has had her first of the 2 Pfizer coronavirus vaccine doses and she tolerated it well.  A detailed review of systems today was otherwise stable    HISTORY OF CURRENT ILLNESS: From the original intake note:  [name] [name] presented with right breast discoloration and redness. The right breast also had an area of hardness and nipple retraction.  She did not bring this to medical attention but her daughter did mention it when the patient had neurologic follow-up on 08/10/2018.  At that time the nurse practitioner, [name] [name], noted right nipple inversion, hard breast, and red non-ulcerated lesions on the breast.  The patient then underwent bilateral diagnostic mammography with tomography and right breast ultrasonography at [facility] on 08/11/2018 (this is her first ever mammogram) showing: breast density category B. There was a round 2.5 cm mass at the 1 o'clock upper inner quadrant of the right breast.  By palpation this measured approximately 10 cm.  Sonographically the irregular hypoechoic mass measured 2.7 cm. There was also a hypoechoic mass in the right breast at 9 o'clock upper outer quadrant measuring 3.8 cm. There was an abnormal right axillary lymph node with cortical thickening.   Accordingly on 08/19/2018 she proceeded to biopsy of the right breast areas in question. The pathology from this procedure showed [phone]): At the 1 o'clock: invasive lobular carcinoma, grade II. Prognostic indicators significant for: estrogen receptor, 60% positive with moderate staining intensity and progesterone receptor, 0% negative. Proliferation marker Ki67 at 20%. HER2 negative with an immunohistochemistry of (1+).  At the 9 o'clock: invasive lobular carcinoma, grade II. Prognostic indicators  significant for: estrogen receptor, 90% positive with strong  staining intensity and progesterone receptor, 0% negative. Proliferation marker Ki67 at 30%. HER2 negative with an immunohistochemistry of (1+).  The patient's subsequent history is as detailed below.   PAST MEDICAL HISTORY: Past Medical History:  Diagnosis Date  . Anxiety   . Cancer [facility])    breast cancer- right  . Carotid stenosis   . Depression    situational  . Dyspnea    with much ambulation  . GERD (gastroesophageal reflux disease)   . Hypertension   . Stroke ([place]) 04/01/2017   a little weakness right side leg and hand, a little memry loss    PAST SURGICAL HISTORY: Past Surgical History:  Procedure Laterality Date  . ABDOMINAL HYSTERECTOMY    . ENDARTERECTOMY Left 04/08/2017   Procedure: ENDARTERECTOMY CAROTID;  Surgeon: [name] Sandy, MD;  Location: [place];  Service: Vascular;  Laterality: Left;  . PATCH ANGIOPLASTY Left 04/08/2017   Procedure: PATCH ANGIOPLASTY Left Carotid;  Surgeon: [name] Sandy, MD;  Location: [place];  Service: Vascular;  Laterality: Left;  . PORTACATH PLACEMENT Left 09/07/2018   Procedure: INSERTION PORT-A-CATH LEFT INTERNAL JUGULAR;  Surgeon: [name] Kussmaul, MD;  Location: [place];  Service: General;  Laterality: Left;  total hysterectomy with bilateral salpingo-oophorectomy in her 51's no hormone replacement   FAMILY HISTORY: Family History  Problem Relation Age of Onset  . Cancer Mother        ovarian   The patient's father is alive at age 43. The patient's mother died at age 19 due to ovarian cancer. The patient had 3 brothers and 3 sisters. She denies a history of breast, pancreatic, colon, or other cancers in the family.    GYNECOLOGIC HISTORY:  No LMP recorded. Patient has had a hysterectomy. Menarche: Age at first live birth: 74 years old She is GX P1.  She is status post total hysterectomy with BSO in her early 49's. She did not use HRT.    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 She is divorced. At home is just herself, but her two grandsons regularly come to visit and stay with her. The patient's daughter, [name] [name] works in administration for [employer]. The patient's 71 y.o. grandson is a Freight forwarder for [employer]. The patient's 41 y.o. grandson works in [employer].    ADVANCED DIRECTIVES: Not in place.  At the 08/26/2018 visit the patient was given the appropriate documents to complete and notarized at her discretion.   HEALTH MAINTENANCE: Social History   Tobacco Use  . Smoking status: Never Smoker  . Smokeless tobacco: Never Used  Substance Use Topics  . Alcohol use: No  . Drug use: No     Colonoscopy: Never  PAP: Status post hysterectomy  Bone density: Never   No Known Allergies  Current Outpatient Medications  Medication Sig Dispense Refill  . amLODipine (NORVASC) 10 MG tablet Take 1 tablet (10 mg total) by mouth daily. PATIENT MUST HAVE OFFICE VISIT AND LABS PRIOR TO ANY FURTHER REFILLS 15 tablet 0  . atorvastatin (LIPITOR) 80 MG tablet Take 1 tablet (80 mg total) by mouth daily at 6 PM. 30 tablet 0  . clopidogrel (PLAVIX) 75 MG tablet TAKE 1 TABLET BY MOUTH EVERY DAY 30 tablet 1  . lisinopril-hydrochlorothiazide (PRINZIDE,ZESTORETIC) 20-12.5 MG tablet Take 1 tablet by mouth daily.  11  . loratadine (CLARITIN) 10 MG tablet Take 10 mg by mouth daily as needed  for allergies.    . mirtazapine (REMERON) 7.5 MG tablet Take 1 tablet (7.5 mg total) by mouth at bedtime. (Patient not taking: Reported on 03/03/2020) 90 tablet 4  . potassium chloride SA (K-DUR,KLOR-CON) 20 MEQ tablet Take 1 tablet (20 mEq total) by mouth daily. (Patient not taking: Reported on 03/03/2020) 30 tablet 0   No current facility-administered medications for this visit.    OBJECTIVE:  There were no vitals filed for this visit. Wt Readings from Last 3 Encounters:  02/02/20 212 lb 9.6 oz (96.4 kg)  12/07/19 205 lb 1.6 oz (93 kg)  11/10/19 206 lb (93.4 kg)   There is no  height or weight on file to calculate BMI.    ECOG FS:2 - Symptomatic, <50% confined to bed  Telemedicine visit  Right breast 02/02/2020    Right breast 11/10/2019     Right breast 08/12/2019    Right Breast 04/07/2019   LAB RESULTS:  CMP     Component Value Date/Time   NA 140 02/02/2020 0925   K 3.2 (L) 02/02/2020 0925   CL 107 02/02/2020 0925   CO2 27 02/02/2020 0925   GLUCOSE 155 (H) 02/02/2020 0925   BUN 12 02/02/2020 0925   CREATININE 0.85 02/02/2020 0925   CREATININE 1.11 (H) 11/03/2018 1017   CALCIUM 8.9 02/02/2020 0925   PROT 6.9 02/02/2020 0925   ALBUMIN 3.5 02/02/2020 0925   AST 14 (L) 02/02/2020 0925   AST 32 11/03/2018 1017   ALT 16 02/02/2020 0925   ALT 48 (H) 11/03/2018 1017   ALKPHOS 109 02/02/2020 0925   BILITOT 0.3 02/02/2020 0925   BILITOT 0.5 11/03/2018 1017   GFRNONAA >60 02/02/2020 0925   GFRNONAA 50 (L) 11/03/2018 1017   GFRAA >60 02/02/2020 0925   GFRAA 58 (L) 11/03/2018 1017    No results found for: TOTALPROTELP, ALBUMINELP, A1GS, A2GS, BETS, BETA2SER, GAMS, MSPIKE, SPEI  No results found for: KPAFRELGTCHN, LAMBDASER, KAPLAMBRATIO  Lab Results  Component Value Date   WBC 6.0 02/02/2020   NEUTROABS 3.4 02/02/2020   HGB 10.2 (L) 02/02/2020   HCT 34.2 (L) 02/02/2020   MCV 79.4 (L) 02/02/2020   PLT 207 02/02/2020    No results found for: LABCA2  No components found for: GYBWLS937  No results for input(s): INR in the last 168 hours.  No results found for: LABCA2  No results found for: DSK876  No results found for: OTL572  No results found for: IOM355  Lab Results  Component Value Date   CA2729 78.6 (H) 08/18/2019    No components found for: HGQUANT  No results found for: CEA1 / No results found for: CEA1   No results found for: AFPTUMOR  No results found for: CHROMOGRNA  No results found for: HGBA, HGBA2QUANT, HGBFQUANT, HGBSQUAN (Hemoglobinopathy evaluation)   No results found for: LDH  No results found  for: IRON, TIBC, IRONPCTSAT (Iron and TIBC)  No results found for: FERRITIN  Urinalysis    Component Value Date/Time   COLORURINE AMBER (A) 04/01/2017 0920   APPEARANCEUR CLOUDY (A) 04/01/2017 0920   LABSPEC 1.021 04/01/2017 0920   PHURINE 5.0 04/01/2017 0920   GLUCOSEU NEGATIVE 04/01/2017 0920   HGBUR MODERATE (A) 04/01/2017 0920   BILIRUBINUR NEGATIVE 04/01/2017 0920   KETONESUR 80 (A) 04/01/2017 0920   PROTEINUR NEGATIVE 04/01/2017 0920   NITRITE NEGATIVE 04/01/2017 0920   LEUKOCYTESUR LARGE (A) 04/01/2017 0920     STUDIES: No results found.  ELIGIBLE FOR AVAILABLE RESEARCH PROTOCOL: no  ASSESSMENT: 74 y.o. [place], [place] woman status post right breast biopsy of overlapping sites on 08/19/2018 for a clinical T4 N1, stage IIIB invasive lobular carcinoma, estrogen receptor positive, progesterone receptor negative, HER-2 not amplified, with an MIB-1 of 20%.  (a) CT chest/abd/pelvis 09/02/2018 and bone scan 09/10/2018 show no evidence of stage IV disease  (b) breast MRI 09/14/2018 confirms a multicentric 12.6 cm right breast mass infiltrating pectpralis but w/o associated adenopathy  (1) genetics testing to be scheduled  (2) neoadjuvant chemotherapy consisting of cyclophosphamide, methotrexate and fluorouracil every 21 days x 8, started 09/16/2018, last dose 12/18/2018  (a) CMF chemotherapy stopped after 4 cycles, with no evidence of response by MRI NOV 2019  (5) anastrozole started February 2019  (a) bone density to be obtained  (b) palbociclib added 01/19/2019 at 125 mg daily, 21/7  (c) palbociclib dose decreased to 100 mg daily 21 on 7 off, starting with second cycle, April 2020  (d) breast MRI 05/05/2019 shows slight improvement, no evidence of progression, however the tumor is not yet resectable.  Chest CT scan same day shows no evidence of metastatic disease  (e) anastrozole and palbociclib discontinued 08/12/2019 with no significant response  (6) foundation 1/PD-L1  testing:  (a) PD-L1 requested 12/18/2018--insufficient tissue  (b) foundation one testing shows the microsatellite status to be stable, and the mutational burden to be 16/Mb  (c) there are potentially actionable mutations in AKT3, TSC1, KRAS  MAPK1 and RB1  (7) started paclitaxel 08/18/2019, given days 1 and 8 of each 21-28-day cycle  (a) changed to every 14 days beginning with second cycle per patient preference  (b) discontinued after 10/27/2019 dose with no evidence of response (minimal evidence of progression)  (8) fulvestrant started 11/10/2019.  (a) discontinued after 02/02/2020 dose as no evidence of response  (9) definitive surgery considered, felt not to be possible given local extent of disease  (10) adjuvant radiation considered May 2021, patient opted against  (11) to start exemestane and everolimus 04/03/2020   PLAN: Caleb opted against palliative radiation.  She is interested in trying the exemestane and everolimus combination.  Today we discussed the possible toxicities side effects and complications of these agents.  She understands that cost may be an issue with exemestane but it is otherwise generally well-tolerated.  With everolimus, even though it is actually much more expensive, cost is not an issue since we usually can obtain it through the company.  The problem there however is the possible severe side effects and these can include mouth sores and pneumonitis.  I asked [name] [name] specifically to become aware if she starts having a little dry cough all day and not to ignore that symptom but to call us if it develops.  Otherwise I will see her again on 12:30 PM June 9 and we will check labs and tolerance at that time.  We will also review the status of her breast.  They know to call for any other issue that may develop before the next visit    Sarajane Jews C. Angelos Wasco, MD 03/27/20 8:29 AM Medical Oncology and Hematology [facility] [place], [place] [address] Tel. [phone]    Fax. [phone]   I, Wilburn Mylar, am acting as scribe for Dr. Virgie [name]. Allyse Fregeau.  I, Lurline Del MD, have reviewed the above documentation for accuracy and completeness, and I agree with the above.    *Total Encounter Time as defined by the Centers for Medicare and Medicaid Services includes, in addition  to the face-to-face time of a patient visit (documented in the note above) non-face-to-face time: obtaining and reviewing outside history, ordering and reviewing medications, tests or procedures, care coordination (communications with other health care professionals or caregivers) and documentation in the medical record.

## 2020-03-27 ENCOUNTER — Telehealth: Payer: Self-pay | Admitting: Pharmacist

## 2020-03-27 ENCOUNTER — Inpatient Hospital Stay: Payer: Medicare Other | Attending: Oncology | Admitting: Oncology

## 2020-03-27 DIAGNOSIS — Z170 Estrogen receptor positive status [ER+]: Secondary | ICD-10-CM

## 2020-03-27 DIAGNOSIS — C50811 Malignant neoplasm of overlapping sites of right female breast: Secondary | ICD-10-CM

## 2020-03-27 DIAGNOSIS — I6389 Other cerebral infarction: Secondary | ICD-10-CM

## 2020-03-27 DIAGNOSIS — C50911 Malignant neoplasm of unspecified site of right female breast: Secondary | ICD-10-CM

## 2020-03-27 MED ORDER — EVEROLIMUS 2.5 MG PO TABS
2.5000 mg | ORAL_TABLET | Freq: Every day | ORAL | 6 refills | Status: DC
Start: 2020-03-27 — End: 2020-05-24

## 2020-03-27 MED ORDER — EXEMESTANE 25 MG PO TABS
25.0000 mg | ORAL_TABLET | Freq: Every day | ORAL | 4 refills | Status: DC
Start: 2020-03-27 — End: 2020-05-24

## 2020-03-27 MED FILL — EXEMESTANE 25 MG TABLET: 25 | 90 days supply | Qty: 90 | Fill #0

## 2020-03-27 NOTE — Telephone Encounter (Signed)
Oral Oncology Pharmacist Encounter  Received new prescription for Afinitor (everolimus) for the treatment of breast cancer in conjunction with exemestane, planned duration until disease progression or unacceptable drug toxicity.  CMP from 02/02/20 assessed, no relevant lab abnormalities. Prescription dose and frequency assessed.   Current medication list in Epic reviewed, no DDIs with everolimus identified: -Everolimus may increase the adverse effects of ACE inhibitors like lisinopril. Monitor patient for s/sx of angioedema. No baseline dose adjustment needed.  Prescription has been e-scribed to the [facility] for benefits analysis and approval.  Oral Oncology Clinic will continue to follow for insurance authorization, copayment issues, initial counseling and start date.  Darl Pikes, PharmD, BCPS, BCOP, CPP Hematology/Oncology Clinical Pharmacist Practitioner [facility] [place] [facility] [phone]  03/27/2020 1:12 PM

## 2020-03-28 ENCOUNTER — Other Ambulatory Visit: Payer: Self-pay

## 2020-03-29 NOTE — Telephone Encounter (Signed)
Oral Chemotherapy Pharmacist Encounter   Patient uninsured and will need to proceed with manufacturer assistance for Afinitor medication access. Manufacture assistance application process has been initiated. This could take 1-2 weeks and likely 2 weeks before patient has Afinitor in hand.  Darl Pikes, PharmD, BCPS, BCOP, CPP Hematology/Oncology Clinical Pharmacist [facility] [place] [facility] [phone]  03/29/2020 3:40 PM

## 2020-03-30 ENCOUNTER — Telehealth: Payer: Self-pay

## 2020-03-30 NOTE — Telephone Encounter (Signed)
Oral Oncology Patient Advocate Encounter  Contacted daughter [name] [name] for permission to complete an application for [employer] Patient [place] (NPAF) in an effort to reduce the patient's out of pocket expense for Afinitor to $0.    Application completed online.   [employer]PA[employer] phone number for follow up is [phone].   This encounter will be updated until final determination.   [name] [name] Patient [place] Phone [phone] Fax [phone] 03/30/2020 12:03 PM

## 2020-03-30 NOTE — Telephone Encounter (Signed)
Patient is approved for Afinitor at no charge from [employer] PAF 03/30/20-03/30/21.  [employer] PAF phone [phone]  [place] Patient [place] Phone [phone] Fax [phone] 03/30/2020 3:41 PM

## 2020-03-31 MED ORDER — DEXAMETHASONE 0.5 MG/5ML PO SOLN: 1.0000 mg | Freq: Four times a day (QID) | ORAL | 4 refills | Status: DC

## 2020-03-31 MED FILL — DEXAMETHASONE 0.5 MG/5 ML L: 0.5 | 12 days supply | Qty: 500 | Fill #0

## 2020-04-03 ENCOUNTER — Telehealth: Payer: Self-pay | Admitting: Pharmacist

## 2020-04-03 DIAGNOSIS — H401131 Primary open-angle glaucoma, bilateral, mild stage: Secondary | ICD-10-CM | POA: Diagnosis not present

## 2020-04-03 MED FILL — EXEMESTANE 25 MG TABLET: 25 | 30 days supply | Qty: 30 | Fill #0

## 2020-04-03 MED FILL — LATANOPROST 0.005% OPTH SOL: 0.005 | 25 days supply | Qty: 3 | Fill #1

## 2020-04-03 NOTE — Telephone Encounter (Signed)
Oral Chemotherapy Pharmacist Encounter  Spoke with patient's daughter [name] today. She knows at the Aromasin and dexamethasone solution are at [facility] awaiting medication pick up. Patient was approved for manufacturer assistance for Afinitor so they are awaiting program delivery of Afinitor. They know to start on both Aromasin and Afinitor when they are in hand. Instructed to start the dexamethasone solution along with the Afinitor.  Patient Education I spoke with [name] for overview of new oral chemotherapy medication: Afinitor (everolimus) for the treatment of breast cancer in conjunction with exemestane, planned duration until disease progression or unacceptable drug toxicity.   Counseled [name] on administration, dosing, side effects, monitoring, drug-food interactions, safe handling, storage, and disposal. Patient will take: Afinitor: Take 1 tablet (2.5 mg total) by mouth daily.  Aromasin: Take 1 tablet (25 mg total) by mouth daily after breakfast. Dexamethasone solution: Take 10 mLs (1 mg total) by mouth 4 (four) times daily. Swish for 2 mins then spit. To be used along with Afinitor.   Afintor side effects include but not limited to: rash/itchy skin, mouth sore, decreased wbc, fatigue, edema, N/V.    Reviewed with [name] importance of keeping a medication schedule and plan for any missed doses.  [name] voiced understanding and appreciation. All questions answered.  Provided [name] with Oral Chemotherapy [facility] phone number. [name] knows to call the office with questions or concerns. Oral Chemotherapy Navigation Clinic will continue to follow.  Darl Pikes, PharmD, BCPS, BCOP, CPP Hematology/Oncology Clinical Pharmacist Practitioner [facility] [place] [facility] [phone]  04/03/2020 11:20 AM

## 2020-04-07 ENCOUNTER — Ambulatory Visit (INDEPENDENT_AMBULATORY_CARE_PROVIDER_SITE_OTHER): Payer: Medicare Other | Admitting: Podiatry

## 2020-04-07 ENCOUNTER — Encounter: Payer: Self-pay | Admitting: Podiatry

## 2020-04-07 ENCOUNTER — Other Ambulatory Visit: Payer: Self-pay

## 2020-04-07 VITALS — Temp 98.4°F

## 2020-04-07 DIAGNOSIS — M79675 Pain in left toe(s): Secondary | ICD-10-CM

## 2020-04-07 DIAGNOSIS — B351 Tinea unguium: Secondary | ICD-10-CM | POA: Diagnosis not present

## 2020-04-07 DIAGNOSIS — D689 Coagulation defect, unspecified: Secondary | ICD-10-CM

## 2020-04-07 DIAGNOSIS — M79674 Pain in right toe(s): Secondary | ICD-10-CM | POA: Diagnosis not present

## 2020-04-07 NOTE — Progress Notes (Signed)
This patient returns to my office for at risk foot care.  This patient requires this care by a professional since this patient will be at risk due to having  Coagulation defect due to plavix.  This patient is unable to cut nails herself since the patient cannot reach her nails.These nails are painful walking and wearing shoes.  This patient presents for at risk foot care today.  General Appearance  Alert, conversant and in no acute stress.  Vascular  Dorsalis pedis and posterior tibial  pulses are palpable  bilaterally.  Capillary return is within normal limits  bilaterally. Temperature is within normal limits  bilaterally.  Neurologic  Senn-Weinstein monofilament wire test within normal limits  bilaterally. Muscle power within normal limits bilaterally.  Nails Thick disfigured discolored nails with subungual debris  from hallux to fifth toes bilaterally. No evidence of bacterial infection or drainage bilaterally.  Orthopedic  No limitations of motion  feet .  No crepitus or effusions noted.  No bony pathology or digital deformities noted.  Skin  normotropic skin with no porokeratosis noted bilaterally.  No signs of infections or ulcers noted.    Onychomycosis  Pain in right toes  Pain in left toes  Consent was obtained for treatment procedures.   Mechanical debridement of nails 1-5  bilaterally performed with a nail nipper.  Filed with dremel without incident.    Return office visit   3 months                   Told patient to return for periodic foot care and evaluation due to potential at risk complications.   Enslee Bibbins DPM  

## 2020-04-11 ENCOUNTER — Ambulatory Visit: Payer: Medicare Other | Admitting: Podiatry

## 2020-04-11 ENCOUNTER — Ambulatory Visit: Payer: Medicare Other | Attending: Internal Medicine

## 2020-04-11 DIAGNOSIS — Z23 Encounter for immunization: Secondary | ICD-10-CM

## 2020-04-11 NOTE — Progress Notes (Signed)
   Covid-19 Vaccination Clinic  Name:  Rosarie Matuska    MRN: [id number] DOB: 1946-07-26  04/11/2020  Ms. Balster was observed post Covid-19 immunization for 15 minutes without incident. She was provided with Vaccine Information Sheet and instruction to access the V-Safe system.   Ms. Nerney was instructed to call 911 with any severe reactions post vaccine: Marland Kitchen Difficulty breathing  . Swelling of face and throat  . A fast heartbeat  . A bad rash all over body  . Dizziness and weakness   Immunizations Administered    Name Date Dose VIS Date Route   Pfizer COVID-19 Vaccine 04/11/2020  9:13 AM 0.3 mL 01/05/2019 Intramuscular   Manufacturer: [address] [place]   Lot: [id number]   [place]: [id number]

## 2020-04-19 ENCOUNTER — Other Ambulatory Visit: Payer: Self-pay

## 2020-04-19 ENCOUNTER — Inpatient Hospital Stay: Payer: Medicare Other | Attending: Oncology | Admitting: Oncology

## 2020-04-19 ENCOUNTER — Inpatient Hospital Stay: Payer: Medicare Other

## 2020-04-19 ENCOUNTER — Telehealth: Payer: Self-pay | Admitting: *Deleted

## 2020-04-19 VITALS — BP 130/52 | HR 99 | Temp 98.7°F | Resp 18 | Ht 66.0 in | Wt 216.3 lb

## 2020-04-19 DIAGNOSIS — N644 Mastodynia: Secondary | ICD-10-CM | POA: Diagnosis not present

## 2020-04-19 DIAGNOSIS — Z170 Estrogen receptor positive status [ER+]: Secondary | ICD-10-CM | POA: Diagnosis not present

## 2020-04-19 DIAGNOSIS — I6389 Other cerebral infarction: Secondary | ICD-10-CM

## 2020-04-19 DIAGNOSIS — I63132 Cerebral infarction due to embolism of left carotid artery: Secondary | ICD-10-CM

## 2020-04-19 DIAGNOSIS — C50811 Malignant neoplasm of overlapping sites of right female breast: Secondary | ICD-10-CM | POA: Diagnosis not present

## 2020-04-19 DIAGNOSIS — I69359 Hemiplegia and hemiparesis following cerebral infarction affecting unspecified side: Secondary | ICD-10-CM

## 2020-04-19 DIAGNOSIS — C50911 Malignant neoplasm of unspecified site of right female breast: Secondary | ICD-10-CM | POA: Diagnosis not present

## 2020-04-19 DIAGNOSIS — R531 Weakness: Secondary | ICD-10-CM | POA: Insufficient documentation

## 2020-04-19 LAB — COMPREHENSIVE METABOLIC PANEL
ALT: 16 U/L (ref 0–44)
AST: 14 U/L — ABNORMAL LOW (ref 15–41)
Albumin: 3.6 g/dL (ref 3.5–5.0)
Alkaline Phosphatase: 102 U/L (ref 38–126)
Anion gap: 12 (ref 5–15)
BUN: 16 mg/dL (ref 8–23)
CO2: 26 mmol/L (ref 22–32)
Calcium: 9.5 mg/dL (ref 8.9–10.3)
Chloride: 106 mmol/L (ref 98–111)
Creatinine, Ser: 0.93 mg/dL (ref 0.44–1.00)
GFR calc Af Amer: >60 mL/min (ref >60)
GFR calc non Af Amer: >60 mL/min (ref >60)
Glucose, Bld: 119 mg/dL — ABNORMAL HIGH (ref 70–99)
Potassium: 3.5 mmol/L (ref 3.5–5.1)
Sodium: 144 mmol/L (ref 135–145)
Total Bilirubin: 0.3 mg/dL (ref 0.3–1.2)
Total Protein: 7.5 g/dL (ref 6.5–8.1)

## 2020-04-19 LAB — CBC WITH DIFFERENTIAL/PLATELET
Abs Immature Granulocytes: 0.02 10*3/uL (ref 0.00–0.07)
Basophils Absolute: 0 10*3/uL (ref 0.0–0.1)
Basophils Relative: 1 %
Eosinophils Absolute: 0.2 10*3/uL (ref 0.0–0.5)
Eosinophils Relative: 3 %
HCT: 36.2 % (ref 36.0–46.0)
Hemoglobin: 10.9 g/dL — ABNORMAL LOW (ref 12.0–15.0)
Immature Granulocytes: 0 %
Lymphocytes Relative: 27 %
Lymphs Abs: 1.9 10*3/uL (ref 0.7–4.0)
MCH: 23 pg — ABNORMAL LOW (ref 26.0–34.0)
MCHC: 30.1 g/dL (ref 30.0–36.0)
MCV: 76.5 fL — ABNORMAL LOW (ref 80.0–100.0)
Monocytes Absolute: 0.7 10*3/uL (ref 0.1–1.0)
Monocytes Relative: 10 %
Neutro Abs: 4.4 10*3/uL (ref 1.7–7.7)
Neutrophils Relative %: 59 %
Platelets: 229 10*3/uL (ref 150–400)
RBC: 4.73 MIL/uL (ref 3.87–5.11)
RDW: 18 % — ABNORMAL HIGH (ref 11.5–15.5)
WBC: 7.3 10*3/uL (ref 4.0–10.5)
nRBC: 0 % (ref 0.0–0.2)

## 2020-04-19 NOTE — Telephone Encounter (Signed)
Referral called to [facility] for Palliative Care 

## 2020-04-19 NOTE — Progress Notes (Signed)
[place]  [phone] [phone]    ID: [name] [name] DOB: 1946-08-31  MR#: [id number]  WCB#:[id number]  Patient Care Team: [name], MD as PCP - General (Internal Medicine) Waynetta Sandy, MD as Consulting Physician (Vascular Surgery) Patsey Berthold, NP as Nurse Practitioner (Cardiology) [name], MD as Consulting Physician (Physical Medicine and Rehabilitation) [name], [name], NP as Nurse Practitioner (Vascular Surgery) Edgardo Roys, PsyD as Consulting Physician (Psychology) Dorraine Ellender, [name], MD as Consulting Physician (Oncology) [name], MD as Consulting Physician (General Surgery) Eppie Gibson, MD as Attending Physician (Radiation Oncology) OTHER MD:   CHIEF COMPLAINT: Inflammatory breast cancer  CURRENT TREATMENT: exemestane/ everolimus   INTERVAL HISTORY: [name] [name] returns today for follow-up of her inflammatory breast cancer.  Since her last visit, she started on evemestane.  She has been on it approximately 3 weeks.  She has absolutely no side effects from this medication.  They had more difficulty getting on the everolimus.  There were some phone calls that were complicated with dropped calls and other issues but they finally got it straight and she started her everolimus 04/17/2020.  So far she is having no side effects   REVIEW OF SYSTEMS: [name] had her Pfizer vaccine and tolerated it well.  She has a good appetite and normal sense of taste.  She does not have problems with diarrhea or constipation at present.  Her days very limited.  She has breakfast which her daughter fixes, watches TV most of the day.  The only walking she does is from one room to the other.  She has Meals on Wheels bringing her lunch and then she has a little supper that her daughter prepares at home.  Currently she is staying at her daughters.  Recently they tried to go to [place]'s house and there are 3 steps and it  was very difficult for her to get her self over that [name] hurdle.  There have been no falls but the daughter has noted increased difficulty getting out of chairs and increased weakness overall.  Perry does complain of intermittent pain in the right breast but she says it is very brief.  She is not taking any medicine for this at present    HISTORY OF CURRENT ILLNESS: From the original intake note:  [name] [name] presented with right breast discoloration and redness. The right breast also had an area of hardness and nipple retraction.  She did not bring this to medical attention but her daughter did mention it when the patient had neurologic follow-up on 08/10/2018.  At that time the nurse practitioner, [name], noted right nipple inversion, hard breast, and red non-ulcerated lesions on the breast.  The patient then underwent bilateral diagnostic mammography with tomography and right breast ultrasonography at [facility] on 08/11/2018 (this is her first ever mammogram) showing: breast density category B. There was a round 2.5 cm mass at the 1 o'clock upper inner quadrant of the right breast.  By palpation this measured approximately 10 cm.  Sonographically the irregular hypoechoic mass measured 2.7 cm. There was also a hypoechoic mass in the right breast at 9 o'clock upper outer quadrant measuring 3.8 cm. There was an abnormal right axillary lymph node with cortical thickening.   Accordingly on 08/19/2018 she proceeded to biopsy of the right breast areas in question. The pathology from this procedure showed [id number]): At the 1 o'clock: invasive lobular carcinoma, grade II. Prognostic indicators significant for: estrogen receptor, 60% positive with moderate  staining intensity and progesterone receptor, 0% negative. Proliferation marker Ki67 at 20%. HER2 negative with an immunohistochemistry of (1+).  At the 9 o'clock: invasive lobular carcinoma, grade II. Prognostic indicators significant for: estrogen  receptor, 90% positive with strong staining intensity and progesterone receptor, 0% negative. Proliferation marker Ki67 at 30%. HER2 negative with an immunohistochemistry of (1+).  The patient's subsequent history is as detailed below.   PAST MEDICAL HISTORY: Past Medical History:  Diagnosis Date  . Anxiety   . Cancer [facility])    breast cancer- right  . Carotid stenosis   . Depression    situational  . Dyspnea    with much ambulation  . GERD (gastroesophageal reflux disease)   . Hypertension   . Stroke ([place]) 04/01/2017   a little weakness right side leg and hand, a little memry loss    PAST SURGICAL HISTORY: Past Surgical History:  Procedure Laterality Date  . ABDOMINAL HYSTERECTOMY    . ENDARTERECTOMY Left 04/08/2017   Procedure: ENDARTERECTOMY CAROTID;  Surgeon: Waynetta Sandy, MD;  Location: [facility];  Service: Vascular;  Laterality: Left;  . PATCH ANGIOPLASTY Left 04/08/2017   Procedure: PATCH ANGIOPLASTY Left Carotid;  Surgeon: Waynetta Sandy, MD;  Location: [facility] [place];  Service: Vascular;  Laterality: Left;  . PORTACATH PLACEMENT Left 09/07/2018   Procedure: INSERTION PORT-A-CATH LEFT INTERNAL JUGULAR;  Surgeon: [name], MD;  Location: [place];  Service: General;  Laterality: Left;  total hysterectomy with bilateral salpingo-oophorectomy in her 4's no hormone replacement   FAMILY HISTORY: Family History  Problem Relation Age of Onset  . Cancer Mother        ovarian   The patient's father is alive at age 79. The patient's mother died at age 43 due to ovarian cancer. The patient had 3 brothers and 3 sisters. She denies a history of breast, pancreatic, colon, or other cancers in the family.    GYNECOLOGIC HISTORY:  No LMP recorded. Patient has had a hysterectomy. Menarche: Age at first live birth: 74 years old She is GX P1.  She is status post total hysterectomy with BSO in her early 94's. She did not use HRT.    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. She is divorced. At home is just herself, but her two grandsons regularly come to visit and stay with her. The patient's daughter, [name] works in administration for [employer]. The patient's 33 y.o. grandson is a Freight forwarder for [employer]. The patient's 67 y.o. grandson works in [employer].    ADVANCED DIRECTIVES: Not in place.  At the 08/26/2018 visit the patient was given the appropriate documents to complete and notarized at her discretion.   HEALTH MAINTENANCE: Social History   Tobacco Use  . Smoking status: Never Smoker  . Smokeless tobacco: Never Used  Substance Use Topics  . Alcohol use: No  . Drug use: No     Colonoscopy: Never  PAP: Status post hysterectomy  Bone density: Never   No Known Allergies  Current Outpatient Medications  Medication Sig Dispense Refill  . amLODipine (NORVASC) 10 MG tablet     . atorvastatin (LIPITOR) 80 MG tablet TAKE 1 TABLET BY MOUTH ONCE DAILY FOR 14 DAYS    . clopidogrel (PLAVIX) 75 MG tablet TAKE 1 TABLET BY MOUTH ONCE DAILY FOR 14 DAYS    . dexamethasone (DECADRON) 0.5 MG/5ML solution Take 10 mLs (1 mg total) by mouth 4 (four) times daily. Swish for  2 mins then spit. To be used along with Afinitor. 500 mL 4  . everolimus (AFINITOR) 2.5 MG tablet Take 1 tablet (2.5 mg total) by mouth daily. Caution:chemotherapy. 30 tablet 6  . exemestane (AROMASIN) 25 MG tablet Take 1 tablet (25 mg total) by mouth daily after breakfast. 90 tablet 4  . latanoprost (XALATAN) 0.005 % ophthalmic solution Place 1 drop into both eyes daily.    Marland Kitchen lisinopril-hydrochlorothiazide (ZESTORETIC) 20-12.5 MG tablet     . loratadine (CLARITIN) 10 MG tablet Take 10 mg by mouth daily as needed for allergies.    . mirtazapine (REMERON) 7.5 MG tablet Take by mouth.    . potassium chloride SA (K-DUR,KLOR-CON) 20 MEQ tablet Take 1 tablet (20 mEq total) by mouth daily. 30 tablet 0   No current facility-administered medications for  this visit.    OBJECTIVE: Older African-American woman Vitals:   04/19/20 1230  BP: (!) 130/52  Pulse: 99  Resp: 18  Temp: 98.7 F (37.1 C)  SpO2: 97%   Wt Readings from Last 3 Encounters:  04/19/20 216 lb 4.8 oz (98.1 kg)  02/02/20 212 lb 9.6 oz (96.4 kg)  12/07/19 205 lb 1.6 oz (93 kg)   Body mass index is 34.91 kg/m.    ECOG FS:2 - Symptomatic, <50% confined to bed  Sclerae unicteric, EOMs intact Wearing a mask Lungs no rales or rhonchi Heart regular rate and rhythm Abd soft, obese, nontender, positive bowel sounds Neuro: nonfocal,  appropriate affect Breasts: The right breast is imaged below.  The left breast is unremarkable. Right breast 04/19/2020      Right breast 02/02/2020     Right breast 08/12/2019    Right Breast 04/07/2019   LAB RESULTS:  CMP     Component Value Date/Time   NA 144 04/19/2020 1215   K 3.5 04/19/2020 1215   CL 106 04/19/2020 1215   CO2 26 04/19/2020 1215   GLUCOSE 119 (H) 04/19/2020 1215   BUN 16 04/19/2020 1215   CREATININE 0.93 04/19/2020 1215   CREATININE 1.11 (H) 11/03/2018 1017   CALCIUM 9.5 04/19/2020 1215   PROT 7.5 04/19/2020 1215   ALBUMIN 3.6 04/19/2020 1215   AST 14 (L) 04/19/2020 1215   AST 32 11/03/2018 1017   ALT 16 04/19/2020 1215   ALT 48 (H) 11/03/2018 1017   ALKPHOS 102 04/19/2020 1215   BILITOT 0.3 04/19/2020 1215   BILITOT 0.5 11/03/2018 1017   GFRNONAA >60 04/19/2020 1215   GFRNONAA 50 (L) 11/03/2018 1017   GFRAA >60 04/19/2020 1215   GFRAA 58 (L) 11/03/2018 1017    No results found for: TOTALPROTELP, ALBUMINELP, A1GS, A2GS, BETS, BETA2SER, GAMS, MSPIKE, SPEI  No results found for: KPAFRELGTCHN, LAMBDASER, KAPLAMBRATIO  Lab Results  Component Value Date   WBC 7.3 04/19/2020   NEUTROABS 4.4 04/19/2020   HGB 10.9 (L) 04/19/2020   HCT 36.2 04/19/2020   MCV 76.5 (L) 04/19/2020   PLT 229 04/19/2020    No results found for: LABCA2  No components found for: HMCNOB096  No results for  input(s): INR in the last 168 hours.  No results found for: LABCA2  No results found for: GEZ662  No results found for: HUT654  No results found for: YTK354  Lab Results  Component Value Date   CA2729 78.6 (H) 08/18/2019    No components found for: HGQUANT  No results found for: CEA1 / No results found for: CEA1   No results found for: AFPTUMOR  No results found  for: CHROMOGRNA  No results found for: HGBA, HGBA2QUANT, HGBFQUANT, HGBSQUAN (Hemoglobinopathy evaluation)   No results found for: LDH  No results found for: IRON, TIBC, IRONPCTSAT (Iron and TIBC)  No results found for: FERRITIN  Urinalysis    Component Value Date/Time   COLORURINE AMBER (A) 04/01/2017 0920   APPEARANCEUR CLOUDY (A) 04/01/2017 0920   LABSPEC 1.021 04/01/2017 0920   PHURINE 5.0 04/01/2017 0920   GLUCOSEU NEGATIVE 04/01/2017 0920   HGBUR MODERATE (A) 04/01/2017 0920   BILIRUBINUR NEGATIVE 04/01/2017 0920   KETONESUR 80 (A) 04/01/2017 0920   PROTEINUR NEGATIVE 04/01/2017 0920   NITRITE NEGATIVE 04/01/2017 0920   LEUKOCYTESUR LARGE (A) 04/01/2017 0920    STUDIES: No results found.   ELIGIBLE FOR AVAILABLE RESEARCH PROTOCOL: no   ASSESSMENT: 74 y.o. Eugenio Saenz, [place] woman status post right breast biopsy of overlapping sites on 08/19/2018 for a clinical T4 N1, stage IIIB invasive lobular carcinoma, estrogen receptor positive, progesterone receptor negative, HER-2 not amplified, with an MIB-1 of 20%.  (a) CT chest/abd/pelvis 09/02/2018 and bone scan 09/10/2018 show no evidence of stage IV disease  (b) breast MRI 09/14/2018 confirms a multicentric 12.6 cm right breast mass infiltrating pectpralis but w/o associated adenopathy  (1) genetics testing to be scheduled  (2) neoadjuvant chemotherapy consisting of cyclophosphamide, methotrexate and fluorouracil every 21 days x 8, started 09/16/2018, last dose 12/18/2018  (a) CMF chemotherapy stopped after 4 cycles, with no evidence of response by  MRI NOV 2019  (5) anastrozole started February 2019  (a) bone density to be obtained  (b) palbociclib added 01/19/2019 at 125 mg daily, 21/7  (c) palbociclib dose decreased to 100 mg daily 21 on 7 off, starting with second cycle, April 2020  (d) breast MRI 05/05/2019 shows slight improvement, no evidence of progression, however the tumor is not yet resectable.  Chest CT scan same day shows no evidence of metastatic disease  (e) anastrozole and palbociclib discontinued 08/12/2019 with no significant response  (6) foundation 1/PD-L1 testing:  (a) PD-L1 requested 12/18/2018--insufficient tissue  (b) foundation one testing shows the microsatellite status to be stable, and the mutational burden to be 16/Mb  (c) there are potentially actionable mutations in AKT3, TSC1, KRAS  MAPK1 and RB1  (7) started paclitaxel 08/18/2019, given days 1 and 8 of each 21-28-day cycle  (a) changed to every 14 days beginning with second cycle per patient preference  (b) discontinued after 10/27/2019 dose with no evidence of response (minimal evidence of progression)  (8) fulvestrant started 11/10/2019.  (a) discontinued after 02/02/2020 dose as no evidence of response  (9) definitive surgery considered, felt not to be possible given local extent of disease  (10) adjuvant radiation considered May 2021, patient opted against  (11) started exemestane mid May 2021   (a) started everolimus 04/17/2020   PLAN: Aryaa has been tolerating the exemestane with no problems and cost has not been an issue.  She only started the everolimus 3 days ago because there were some issues obtaining it.  Now they have it on hand and she is taking it daily.  So far she has had no side effects.  We again reviewed the fact that she is at risk for mouth sores and also for pneumonitis and if she develops a dry cough that persist she needs to let us know immediately.  There has been obvious progression in the right breast area as imaged  above.  That is our starting point.  I am going to see her again in  about 4 or 5weeks and assess for initial response and tolerance.  Today I wrote her a prescription for a Rollator.  I also placed after discussing it with them a palliative care referral through [facility].  They know to call for any other issue that may develop before the next visit.  Total encounter time 30 minutes.Sarajane Jews C. Mera Gunkel, MD 04/19/20 1:08 PM Medical Oncology and Hematology [facility] [place], [place] [address] Tel. [phone]    Fax. [phone]   I, Wilburn Mylar, am acting as scribe for Dr. [name]. Lindzey Zent.  I, Lurline Del MD, have reviewed the above documentation for accuracy and completeness, and I agree with the above.   *Total Encounter Time as defined by the Centers for Medicare and Medicaid Services includes, in addition to the face-to-face time of a patient visit (documented in the note above) non-face-to-face time: obtaining and reviewing outside history, ordering and reviewing medications, tests or procedures, care coordination (communications with other health care professionals or caregivers) and documentation in the medical record.

## 2020-04-20 ENCOUNTER — Telehealth: Payer: Self-pay | Admitting: Oncology

## 2020-04-20 NOTE — Telephone Encounter (Signed)
Scheduled appts per 6/9 los. [name] confirmed appt date and time.

## 2020-04-29 MED FILL — DEXAMETHASONE 0.5 MG/5 ML L: 0.5 | 12 days supply | Qty: 500 | Fill #1

## 2020-05-03 ENCOUNTER — Telehealth: Payer: Self-pay | Admitting: *Deleted

## 2020-05-03 NOTE — Telephone Encounter (Signed)
This RN spoke with the patient's daughter,[name], per her call stating Annabell has noted changes in bowels.  She is having 2 very loose " watery" stools about every day.  She had cramping at first and thought it was something she ate- she also had 1 episode of vomiting last week with onset.  Presently she is not nauseated and cramps have subsided.  She is able to hydrate.  She does not note any mouth sores or mouth irritation.  She had not taken anything for the loose stools.  Per MD review - recommended for the pt to hold the afinitor - if diarrhea not improved by Friday to call - otherwise if diarrhea subsides- she is to restart the Afinitor on Sunday 6/27.  This RN called above to Graham with verbalized understanding.

## 2020-05-04 MED FILL — EXEMESTANE 25 MG TABLET: 25 | 30 days supply | Qty: 30 | Fill #1

## 2020-05-16 ENCOUNTER — Telehealth: Payer: Self-pay

## 2020-05-16 NOTE — Telephone Encounter (Signed)
RN called and left message for patients daughter [name] requesting cal back to schedule visit time.

## 2020-05-16 NOTE — Telephone Encounter (Signed)
Telephone call to patients daughter [name]. RN LM requesting call back to schedule palliative care visit.

## 2020-05-18 ENCOUNTER — Telehealth: Payer: Self-pay

## 2020-05-18 NOTE — Telephone Encounter (Signed)
Telephone call to schedule palliative care visit.  RN left message for patients daughter [name] requesting call back to schedule time for palliative care visit.

## 2020-05-23 ENCOUNTER — Telehealth: Payer: Self-pay | Admitting: *Deleted

## 2020-05-23 ENCOUNTER — Telehealth: Payer: Self-pay

## 2020-05-23 NOTE — Telephone Encounter (Signed)
Ms Shew restarted Afinitor ~ 2 weeks ago. Has been having diarrhea again. Dr Jana Hakim wants her to stop and he will discuss on Friday

## 2020-05-23 NOTE — Telephone Encounter (Signed)
Palliative care SW LVM for patient/daughter to schedule initial visit. Awaiting return call.

## 2020-05-24 ENCOUNTER — Inpatient Hospital Stay: Payer: Medicare Other

## 2020-05-24 ENCOUNTER — Inpatient Hospital Stay: Payer: Medicare Other | Attending: Oncology | Admitting: Oncology

## 2020-05-24 ENCOUNTER — Telehealth: Payer: Self-pay

## 2020-05-24 ENCOUNTER — Other Ambulatory Visit: Payer: Self-pay

## 2020-05-24 VITALS — BP 140/59 | HR 103 | Temp 98.7°F | Resp 17 | Ht 66.0 in | Wt 207.8 lb

## 2020-05-24 DIAGNOSIS — Z9221 Personal history of antineoplastic chemotherapy: Secondary | ICD-10-CM | POA: Insufficient documentation

## 2020-05-24 DIAGNOSIS — C50811 Malignant neoplasm of overlapping sites of right female breast: Secondary | ICD-10-CM | POA: Insufficient documentation

## 2020-05-24 DIAGNOSIS — Z170 Estrogen receptor positive status [ER+]: Secondary | ICD-10-CM

## 2020-05-24 DIAGNOSIS — C50911 Malignant neoplasm of unspecified site of right female breast: Secondary | ICD-10-CM | POA: Diagnosis not present

## 2020-05-24 DIAGNOSIS — Z95828 Presence of other vascular implants and grafts: Secondary | ICD-10-CM

## 2020-05-24 DIAGNOSIS — I6389 Other cerebral infarction: Secondary | ICD-10-CM | POA: Diagnosis not present

## 2020-05-24 DIAGNOSIS — I69359 Hemiplegia and hemiparesis following cerebral infarction affecting unspecified side: Secondary | ICD-10-CM

## 2020-05-24 DIAGNOSIS — I63132 Cerebral infarction due to embolism of left carotid artery: Secondary | ICD-10-CM

## 2020-05-24 DIAGNOSIS — N63 Unspecified lump in unspecified breast: Secondary | ICD-10-CM | POA: Diagnosis not present

## 2020-05-24 LAB — COMPREHENSIVE METABOLIC PANEL
ALT: 13 U/L (ref 0–44)
AST: 14 U/L — ABNORMAL LOW (ref 15–41)
Albumin: 3.4 g/dL — ABNORMAL LOW (ref 3.5–5.0)
Alkaline Phosphatase: 160 U/L — ABNORMAL HIGH (ref 38–126)
Anion gap: 10 (ref 5–15)
BUN: 16 mg/dL (ref 8–23)
CO2: 22 mmol/L (ref 22–32)
Calcium: 9.1 mg/dL (ref 8.9–10.3)
Chloride: 108 mmol/L (ref 98–111)
Creatinine, Ser: 1.05 mg/dL — ABNORMAL HIGH (ref 0.44–1.00)
GFR calc Af Amer: >60 mL/min (ref >60)
GFR calc non Af Amer: 53 mL/min — ABNORMAL LOW (ref >60)
Glucose, Bld: 109 mg/dL — ABNORMAL HIGH (ref 70–99)
Potassium: 3.5 mmol/L (ref 3.5–5.1)
Sodium: 140 mmol/L (ref 135–145)
Total Bilirubin: 0.3 mg/dL (ref 0.3–1.2)
Total Protein: 7.5 g/dL (ref 6.5–8.1)

## 2020-05-24 LAB — CBC WITH DIFFERENTIAL/PLATELET
Abs Immature Granulocytes: 0.04 10*3/uL (ref 0.00–0.07)
Basophils Absolute: 0 10*3/uL (ref 0.0–0.1)
Basophils Relative: 1 %
Eosinophils Absolute: 0.5 10*3/uL (ref 0.0–0.5)
Eosinophils Relative: 7 %
HCT: 35 % — ABNORMAL LOW (ref 36.0–46.0)
Hemoglobin: 10.5 g/dL — ABNORMAL LOW (ref 12.0–15.0)
Immature Granulocytes: 1 %
Lymphocytes Relative: 35 %
Lymphs Abs: 2.7 10*3/uL (ref 0.7–4.0)
MCH: 22.5 pg — ABNORMAL LOW (ref 26.0–34.0)
MCHC: 30 g/dL (ref 30.0–36.0)
MCV: 75.1 fL — ABNORMAL LOW (ref 80.0–100.0)
Monocytes Absolute: 1 10*3/uL (ref 0.1–1.0)
Monocytes Relative: 13 %
Neutro Abs: 3.5 10*3/uL (ref 1.7–7.7)
Neutrophils Relative %: 43 %
Platelets: 212 10*3/uL (ref 150–400)
RBC: 4.66 MIL/uL (ref 3.87–5.11)
RDW: 17.5 % — ABNORMAL HIGH (ref 11.5–15.5)
WBC: 7.8 10*3/uL (ref 4.0–10.5)
nRBC: 0 % (ref 0.0–0.2)

## 2020-05-24 MED ORDER — SODIUM CHLORIDE 0.9% FLUSH
10.0000 mL | Freq: Once | INTRAVENOUS | Status: AC
  Administered 2020-05-24: 10 mL
  Filled 2020-05-24: qty 10

## 2020-05-24 MED ORDER — HEPARIN SOD (PORK) LOCK FLUSH 100 UNIT/ML IV SOLN
500.0000 [IU] | Freq: Once | INTRAVENOUS | Status: AC
  Administered 2020-05-24: 500 [IU]
  Filled 2020-05-24: qty 5

## 2020-05-24 NOTE — Telephone Encounter (Signed)
RN left message for MD stating palliative care team has attempted to schedule visit multipe times.  Message have been left for daughter [name] at [phone] with no call backs.  RN requested another number to contact patient or if family does not want services referral would be rejected.  RN requested call back.

## 2020-05-24 NOTE — Telephone Encounter (Signed)
Telephone call to schedule palliative care visit.  RN LM requesting call back to schedule palliative care visit.  RN also stated referral would be rejected if call back is not received as multiple messages have been left to schedule visit.

## 2020-05-24 NOTE — Telephone Encounter (Signed)
Telephone call from MD's office [facility]) reporting they have the same number for patient.  Patient has MD appointment with MD on Friday and MD will discuss if patient and family want palliative care services.

## 2020-05-24 NOTE — Progress Notes (Signed)
[place]  [phone] Fax:([phone]) [phone]    ID: Vernecia Umble DOB: 07/13/1946  MR#: [id number]  LTJ#:[id number]  Patient Care Team: [name], MD as PCP - General (Internal Medicine) Waynetta Sandy, MD as Consulting Physician (Vascular Surgery) Patsey Berthold, NP as Nurse Practitioner (Cardiology) [name], MD as Consulting Physician (Physical Medicine and Rehabilitation) [name], [name], NP (Inactive) as Nurse Practitioner (Vascular Surgery) Edgardo Roys, PsyD as Consulting Physician (Psychology) Kaelie Henigan, [name], MD as Consulting Physician (Oncology) [name], MD as Consulting Physician (General Surgery) Eppie Gibson, MD as Attending Physician (Radiation Oncology) OTHER MD:   CHIEF COMPLAINT: Inflammatory breast cancer  CURRENT TREATMENT: Considering palliative radiation   INTERVAL HISTORY: Naoma returns today for follow-up and treatment of her inflammatory breast cancer accompanied by her daughter. She was last seen here on 04/19/2020.   She was started on Afinitor early June but called on 05/23/2020 to tell us that she was having severe diarrhea.  This had happened previously and we held the medication until the diarrhea resolved but the diarrhea resumed after the medication was restarted.  Since her last visit here, she has not undergone any additional studies.     REVIEW OF SYSTEMS: Salsabeel is clinically very stable.  She does not complain of pain.  She has had the diarrhea problems and she has mental status that she can.  She has not had mouth sores or significant problems breathing or any symptoms suggestive of pneumonitis on the Afinitor.  She has not had a rash from the medication.  She just had a birthday, she had a birthday party and she ate a lot she says.  A detailed review of systems was otherwise stable  HISTORY OF CURRENT ILLNESS: From the original intake note:  Gwendalyn Mcgonagle presented  with right breast discoloration and redness. The right breast also had an area of hardness and nipple retraction.  She did not bring this to medical attention but her daughter did mention it when the patient had neurologic follow-up on 08/10/2018.  At that time the nurse practitioner, [name], noted right nipple inversion, hard breast, and red non-ulcerated lesions on the breast.  The patient then underwent bilateral diagnostic mammography with tomography and right breast ultrasonography at [facility] on 08/11/2018 (this is her first ever mammogram) showing: breast density category B. There was a round 2.5 cm mass at the 1 o'clock upper inner quadrant of the right breast.  By palpation this measured approximately 10 cm.  Sonographically the irregular hypoechoic mass measured 2.7 cm. There was also a hypoechoic mass in the right breast at 9 o'clock upper outer quadrant measuring 3.8 cm. There was an abnormal right axillary lymph node with cortical thickening.   Accordingly on 08/19/2018 she proceeded to biopsy of the right breast areas in question. The pathology from this procedure showed [phone]): At the 1 o'clock: invasive lobular carcinoma, grade II. Prognostic indicators significant for: estrogen receptor, 60% positive with moderate staining intensity and progesterone receptor, 0% negative. Proliferation marker Ki67 at 20%. HER2 negative with an immunohistochemistry of (1+).  At the 9 o'clock: invasive lobular carcinoma, grade II. Prognostic indicators significant for: estrogen receptor, 90% positive with strong staining intensity and progesterone receptor, 0% negative. Proliferation marker Ki67 at 30%. HER2 negative with an immunohistochemistry of (1+).  The patient's subsequent history is as detailed below.   PAST MEDICAL HISTORY: Past Medical History:  Diagnosis Date  . Anxiety   . Cancer ([place])  breast cancer- right  . Carotid stenosis   . Depression    situational  . Dyspnea    with  much ambulation  . GERD (gastroesophageal reflux disease)   . Hypertension   . Stroke ([place]) 04/01/2017   a little weakness right side leg and hand, a little memry loss    PAST SURGICAL HISTORY: Past Surgical History:  Procedure Laterality Date  . ABDOMINAL HYSTERECTOMY    . ENDARTERECTOMY Left 04/08/2017   Procedure: ENDARTERECTOMY CAROTID;  Surgeon: Waynetta Sandy, MD;  Location: [place];  Service: Vascular;  Laterality: Left;  . PATCH ANGIOPLASTY Left 04/08/2017   Procedure: PATCH ANGIOPLASTY Left Carotid;  Surgeon: Waynetta Sandy, MD;  Location: [place];  Service: Vascular;  Laterality: Left;  . PORTACATH PLACEMENT Left 09/07/2018   Procedure: INSERTION PORT-A-CATH LEFT INTERNAL JUGULAR;  Surgeon: [name], MD;  Location: [place];  Service: General;  Laterality: Left;  total hysterectomy with bilateral salpingo-oophorectomy in her 74's no hormone replacement   FAMILY HISTORY: Family History  Problem Relation Age of Onset  . Cancer Mother        ovarian   The patient's father is alive at age 72. The patient's mother died at age 22 due to ovarian cancer. The patient had 3 brothers and 3 sisters. She denies a history of breast, pancreatic, colon, or other cancers in the family.    GYNECOLOGIC HISTORY:  No LMP recorded. Patient has had a hysterectomy. Menarche: Age at first live birth: 74 years old She is GX P1.  She is status post total hysterectomy with BSO in her early 30's. She did not use HRT.    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. She is divorced. At home is just herself, but her two grandsons regularly come to visit and stay with her. The patient's daughter, [name] works in administration for [employer]. The patient's 95 y.o. grandson is a Freight forwarder for [employer]. The patient's 56 y.o. grandson works in [employer].    ADVANCED DIRECTIVES: Not in place.  At the 08/26/2018 visit the patient was given the  appropriate documents to complete and notarized at her discretion.   HEALTH MAINTENANCE: Social History   Tobacco Use  . Smoking status: Never Smoker  . Smokeless tobacco: Never Used  Vaping Use  . Vaping Use: Never used  Substance Use Topics  . Alcohol use: No  . Drug use: No     Colonoscopy: Never  PAP: Status post hysterectomy  Bone density: Never   No Known Allergies  Current Outpatient Medications  Medication Sig Dispense Refill  . amLODipine (NORVASC) 10 MG tablet     . atorvastatin (LIPITOR) 80 MG tablet TAKE 1 TABLET BY MOUTH ONCE DAILY FOR 14 DAYS    . clopidogrel (PLAVIX) 75 MG tablet TAKE 1 TABLET BY MOUTH ONCE DAILY FOR 14 DAYS    . latanoprost (XALATAN) 0.005 % ophthalmic solution Place 1 drop into both eyes daily.    Marland Kitchen lisinopril-hydrochlorothiazide (ZESTORETIC) 20-12.5 MG tablet     . loratadine (CLARITIN) 10 MG tablet Take 10 mg by mouth daily as needed for allergies.    . mirtazapine (REMERON) 7.5 MG tablet Take by mouth.    . potassium chloride SA (K-DUR,KLOR-CON) 20 MEQ tablet Take 1 tablet (20 mEq total) by mouth daily. 30 tablet 0   No current facility-administered medications for this visit.    OBJECTIVE: African-American woman who appears older than stated age  Vitals:   05/24/20 1541  BP: (!) 140/59  Pulse: (!) 103  Resp: 17  Temp: 98.7 F (37.1 C)  SpO2: 99%   Wt Readings from Last 3 Encounters:  05/24/20 207 lb 12.8 oz (94.3 kg)  04/19/20 216 lb 4.8 oz (98.1 kg)  02/02/20 212 lb 9.6 oz (96.4 kg)   Body mass index is 33.54 kg/m.    ECOG FS:2 - Symptomatic, <50% confined to bed  Ocular: Sclerae unicteric, pupils round and equal Ear-nose-throat: Wearing a mask Lymphatic: No cervical or supraclavicular adenopathy Lungs no rales or rhonchi Heart regular rate and rhythm Abd soft, obese, nontender, positive bowel sounds MSK no focal spinal tenderness, no joint edema Neuro: non-focal, appropriate affect Breasts: The right breast is  imaged below.  There is evidence of disease progression and 2 new lesions laterally and too difficult to make out lesions which are palpable medially.   Right breast 05/24/2020     Right breast 04/19/2020      Right breast 02/02/2020     Right breast 08/12/2019    Right Breast 04/07/2019   LAB RESULTS:  CMP     Component Value Date/Time   NA 140 05/24/2020 1522   K 3.5 05/24/2020 1522   CL 108 05/24/2020 1522   CO2 22 05/24/2020 1522   GLUCOSE 109 (H) 05/24/2020 1522   BUN 16 05/24/2020 1522   CREATININE 1.05 (H) 05/24/2020 1522   CREATININE 1.11 (H) 11/03/2018 1017   CALCIUM 9.1 05/24/2020 1522   PROT 7.5 05/24/2020 1522   ALBUMIN 3.4 (L) 05/24/2020 1522   AST 14 (L) 05/24/2020 1522   AST 32 11/03/2018 1017   ALT 13 05/24/2020 1522   ALT 48 (H) 11/03/2018 1017   ALKPHOS 160 (H) 05/24/2020 1522   BILITOT 0.3 05/24/2020 1522   BILITOT 0.5 11/03/2018 1017   GFRNONAA 53 (L) 05/24/2020 1522   GFRNONAA 50 (L) 11/03/2018 1017   GFRAA >60 05/24/2020 1522   GFRAA 58 (L) 11/03/2018 1017    No results found for: TOTALPROTELP, ALBUMINELP, A1GS, A2GS, BETS, BETA2SER, GAMS, MSPIKE, SPEI  No results found for: KPAFRELGTCHN, LAMBDASER, KAPLAMBRATIO  Lab Results  Component Value Date   WBC 7.8 05/24/2020   NEUTROABS 3.5 05/24/2020   HGB 10.5 (L) 05/24/2020   HCT 35.0 (L) 05/24/2020   MCV 75.1 (L) 05/24/2020   PLT 212 05/24/2020    No results found for: LABCA2  No components found for: WNIOEV035  No results for input(s): INR in the last 168 hours.  No results found for: LABCA2  No results found for: KKX381  No results found for: WEX937  No results found for: JIR678  Lab Results  Component Value Date   CA2729 78.6 (H) 08/18/2019    No components found for: HGQUANT  No results found for: CEA1 / No results found for: CEA1   No results found for: AFPTUMOR  No results found for: Gantt  No results found for: HGBA, HGBA2QUANT, HGBFQUANT,  HGBSQUAN (Hemoglobinopathy evaluation)   No results found for: LDH  No results found for: IRON, TIBC, IRONPCTSAT (Iron and TIBC)  No results found for: FERRITIN  Urinalysis    Component Value Date/Time   COLORURINE AMBER (A) 04/01/2017 0920   APPEARANCEUR CLOUDY (A) 04/01/2017 0920   LABSPEC 1.021 04/01/2017 0920   PHURINE 5.0 04/01/2017 0920   GLUCOSEU NEGATIVE 04/01/2017 0920   HGBUR MODERATE (A) 04/01/2017 0920   BILIRUBINUR NEGATIVE 04/01/2017 0920   KETONESUR 80 (A) 04/01/2017 0920   PROTEINUR  NEGATIVE 04/01/2017 0920   NITRITE NEGATIVE 04/01/2017 0920   LEUKOCYTESUR LARGE (A) 04/01/2017 0920    STUDIES: No results found.   ELIGIBLE FOR AVAILABLE RESEARCH PROTOCOL: no   ASSESSMENT: 74 y.o. [place], [place] woman status post right breast biopsy of overlapping sites on 08/19/2018 for a clinical T4 N1, stage IIIB invasive lobular carcinoma, estrogen receptor positive, progesterone receptor negative, HER-2 not amplified, with an MIB-1 of 20%.  (a) CT chest/abd/pelvis 09/02/2018 and bone scan 09/10/2018 show no evidence of stage IV disease  (b) breast MRI 09/14/2018 confirms a multicentric 12.6 cm right breast mass infiltrating pectpralis but w/o associated adenopathy  (1) genetics testing to be scheduled  (2) neoadjuvant chemotherapy consisting of cyclophosphamide, methotrexate and fluorouracil every 21 days x 8, started 09/16/2018, last dose 12/18/2018  (a) CMF chemotherapy stopped after 4 cycles, with no evidence of response by MRI NOV 2019  (5) anastrozole started February 2019  (a) bone density to be obtained  (b) palbociclib added 01/19/2019 at 125 mg daily, 21/7  (c) palbociclib dose decreased to 100 mg daily 21 on 7 off, starting with second cycle, April 2020  (d) breast MRI 05/05/2019 shows slight improvement, no evidence of progression, however the tumor is not yet resectable.  Chest CT scan same day shows no evidence of metastatic disease  (e) anastrozole and  palbociclib discontinued 08/12/2019 with no significant response  (6) foundation 1/PD-L1 testing:  (a) PD-L1 requested 12/18/2018--insufficient tissue  (b) foundation one testing shows the microsatellite status to be stable, and the mutational burden to be 16/Mb  (c) there are potentially actionable mutations in AKT3, TSC1, KRAS  MAPK1 and RB1  (7) started paclitaxel 08/18/2019, given days 1 and 8 of each 21-28-day cycle  (a) changed to every 14 days beginning with second cycle per patient preference  (b) discontinued after 10/27/2019 dose with no evidence of response (minimal evidence of progression)  (8) fulvestrant started 11/10/2019.  (a) discontinued after 02/02/2020 dose as no evidence of response  (9) definitive surgery considered, felt not to be possible given local extent of disease  (10) adjuvant radiation considered May 2021, patient opted against  (11) started exemestane mid May 2021   (a) started everolimus 04/17/2020--discontinued after repeated diarrhea episodes  (b) exemestane and everolimus discontinued 05/24/2020 with evidence of disease progression   PLAN: Darryl did her best to take the everolimus as prescribed.  She did develop diarrhea with the medication, which resolved when the medication was stopped and resumed when the medication was restarted.  I do not think she will be able to tolerate it.  In any case there is evidence of disease progression with new nodules both laterally and medially around the right breast.  At this point I do feel pretty much out of ideas.  [place]eca really does not want to have any treatment that is going to compromise her already compromised quality of life.  I can certainly understand that.  She did try CMF chemotherapy, some Taxol, and capecitabine, and they did not work.  I have run through all the estrogen antagonist combinations I know.  Accordingly at this point my suggestion is that she reconsider the possibility of radiation.   She is not sure she wants to do that.  Another option I gave her a second opinion possibly at [facility].  She will consider that.  A third possibility is to simply decide that she is going to let this cancer be and she is going to concentrate on comfort care under the  care of hospice.  We have already placed a palliative care referral and that is in process.  They could not quite make a decision today.  They are going to call me back with a decision they tell me later this week or early next week.  In any case I am making her a virtual visit with me in a few weeks so we can catch up at that time.  At this point we are continuing on observation alone until we get further direction from [place] and her daughter.  Total encounter time 35 minutes.Sarajane Jews C. Torrez Renfroe, MD 05/24/20 4:18 PM Medical Oncology and Hematology [facility] [place], [place] [address] Tel. [phone]    Fax. [phone]   I, Jacqualyn Posey am acting as a Education administrator for Chauncey Cruel, MD.   I, Lurline Del MD, have reviewed the above documentation for accuracy and completeness, and I agree with the above.     *Total Encounter Time as defined by the Centers for Medicare and Medicaid Services includes, in addition to the face-to-face time of a patient visit (documented in the note above) non-face-to-face time: obtaining and reviewing outside history, ordering and reviewing medications, tests or procedures, care coordination (communications with other health care professionals or caregivers) and documentation in the medical record.

## 2020-05-24 NOTE — Patient Instructions (Signed)

## 2020-05-25 ENCOUNTER — Telehealth: Payer: Self-pay

## 2020-05-25 NOTE — Telephone Encounter (Signed)
Patients daughter [name] returned RN's call to schedule palliative care visit.  Daughter in agreement with palliative care team making home visit. 06/07/20 at 11:00 AM.

## 2020-06-07 ENCOUNTER — Other Ambulatory Visit: Payer: Medicare Other

## 2020-06-07 ENCOUNTER — Other Ambulatory Visit: Payer: Self-pay

## 2020-06-07 DIAGNOSIS — Z515 Encounter for palliative care: Secondary | ICD-10-CM

## 2020-06-07 NOTE — Progress Notes (Signed)
COMMUNITY PALLIATIVE CARE SW NOTE  PATIENT NAME: [name] [name] DOB: 12-03-1945 MRN: [id number]  PRIMARY CARE PROVIDER: [name], MD  RESPONSIBLE PARTY:  Acct ID - Guarantor Home Phone Work Phone Relationship Acct Type  [id number] [facility]* [phone]  Self P/F     [address], [place], [place] [address]     PLAN OF CARE and INTERVENTIONS:             1. GOALS OF CARE/ ADVANCE CARE PLANNING: Patient is a DNR, form is not in the home, daughter to outreach PCP for original. Patient daughter, [name], is [place]. Patient as living will. Patient's goal to be able to read her books, rid her system of remaining oral chemo and avoid hospitalizations. 2. SOCIAL/EMOTIONAL/SPIRITUAL ASSESSMENT/ INTERVENTIONS:  SW and RN met with patient and patient's daughter, [name], in daughter home of which patient resides. Patient recently moved into daughters 2 story home. Patient has bed downstairs. Daughter and patient shared that patient will no longer receive chemo/radiation treatments. RN discussed hospice services and benefits. Patient shared that her appetite is not good, but it is getting better now that she is no longer taking the oral chemo pills. Patient shared that the chemo pills gave her bad diarrhea but she had not had an episode in the last day or so. Patient sleeps well and is not in any pain. SW provided education on palliative care, discussed goals, reviewed care plan, provided emotional support, used active and reflective listening. Patient and daughter in agreement with palliative care services at this time for symptom management.  3. PATIENT/CAREGIVER EDUCATION/ COPING: Patient was A&O x3 and engaged during visit. Patient denied any feelings of anxiety. Patient is not a "talker" but did expresses feelings when asked directly. Patient shared that she enjoyed reading when she could see. 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 Patients daughter is the only child,  patient has 2 adult grandchildren that live out of state.  4. PERSONAL EMERGENCY PLAN:  Patient/daughter to call 911 for emergencies. 5. COMMUNITY RESOURCES COORDINATION/ HEALTH CARE NAVIGATION:  Daughter assist patient with managing Care. 6. FINANCIAL/LEGAL CONCERNS/INTERVENTIONS:  None.     SOCIAL HX:  Social History   Tobacco Use  . Smoking status: Never Smoker  . Smokeless tobacco: Never Used  Substance Use Topics  . Alcohol use: No    CODE STATUS: DNR ADVANCED DIRECTIVES: Y MOST FORM COMPLETE:  N HOSPICE EDUCATION PROVIDED: Y  PPS: Patient is independent with all ADL's  And does not use AD. Patient reports feeling weak at times due to chemo. Daughter does meal prep.   Time Spent; 40 min.       Doreene Eland, [place]

## 2020-06-07 NOTE — Progress Notes (Signed)
PATIENT NAME: [name] [name] DOB: June 02, 1946 MRN: [id number]  PRIMARY CARE PROVIDER: [name], MD  RESPONSIBLE PARTY:  Acct ID - Guarantor Home Phone Work Phone Relationship Acct Type  [id number] - Mensinger,CYNTH* [phone]  Self P/F     [address], [place], [place] [address]    PLAN OF CARE and INTERVENTIONS:               1.  GOALS OF CARE/ ADVANCE CARE PLANNING: Patient would like to feel stronger, be able to read and not receive anymore chemo.               2.  PATIENT/CAREGIVER EDUCATION:  Education on palliative care verses hospice care, education on disease progression, reviewed meds, support               3.  DISEASE STATUS: SW  Henrene Pastor and RN made scheduled palliative care home visit. Palliative care team met with patient and patients daughter [name] [name]. Patient sitting at dining room table alert and oriented. 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 Daughter reports patient received intravenous chemo and then was placed on oral chemo. Patient made decision to stop chemotherapy due to side effects and having diarrhea and rash. MD discussed patient having radiation for 4 to 6 weeks 5 days per week. Patient made decision she did not want to receive radiation as MD could not guarantee in shrinkage in tumor.  Palliative care team discussed palliative care vs. Hospice care. Patient daughter wish for patient to receive palliative care services at the present time. Patient denies pain at the present time. Patients current weight 207 pounds and patient is 5'6 in height. Patient denies suffering  any recent falls.  Daughter reports patient has lost 9 to 10 lbs the past month due to loose stools from chemo. Dr Vinie Sill informed patient and daughter there was nothing else that could be done for patient. Patient reports her appetite is not good however daughter reports patients appetite is slowly coming back. Patient continues to have diarrhea every other day. Patient reports she sleeps fair at night but does wake up. Patient denies having any shortness of breath or cough. Patient admits to feeling weak and tired. Patient states she would like to be able to read and work puzzles however chemo affected her vision so she can no longer read or work puzzles.  Patients breath sounds are diminished throughout. Patient has tight non pitting edema in her lower extremities. Education to keep lower extremities elevated as much as possible. Daughter reports patient was diagnosed with right breast cancer in 2019. Patient wishes to be a DNR and daughter states she will request one from Dr Kirstie Mirza office. Patient has a living will and [place] of attorney. Nurse reviewed patient's medications with daughter. Patient and daughter in agreement with palliative care services. Patient and daughter encouraged to contact palliative care with questions or concerns.     HISTORY OF PRESENT ILLNESS: Patient is a 74 year old female who resides in home with daughter [name] [name].  Patient and daughter in agreement with palliative care and will be seen monthly and PRN.   CODE STATUS: No Code  ADVANCED DIRECTIVES: Y Living will and  HCPOA MOST FORM: No PPS: 60%   PHYSICAL EXAM:   VITALS: Today's Vitals   06/07/20 1115  Weight: (!) 207 lb (93.9 kg)  Height: _0  (1.676 m)  PainSc: 0-No pain    LUNGS: decreased breath sounds CARDIAC: Cor RRR  EXTREMITIES: tight pitting edema in lower extremities SKIN: no visible open areas of skin breakdown  NEURO: alert and oriented/ weakness      Nilda Simmer, RN

## 2020-06-15 MED FILL — LISINOPRIL-HCTZ 20-12.5 MG: 20-12.5 | 90 days supply | Qty: 90 | Fill #0

## 2020-06-15 MED FILL — CLOPIDOGREL 75 MG TABLET: 75 | 90 days supply | Qty: 90 | Fill #0

## 2020-06-16 ENCOUNTER — Telehealth: Payer: Self-pay

## 2020-06-16 NOTE — Telephone Encounter (Signed)
RN call to daughter to schedule follow up palliative encounter via phone or virtual. Daughter will call back this afternoon to schedule for next week

## 2020-06-28 ENCOUNTER — Other Ambulatory Visit: Payer: Medicare Other

## 2020-06-28 DIAGNOSIS — Z170 Estrogen receptor positive status [ER+]: Secondary | ICD-10-CM

## 2020-06-28 DIAGNOSIS — C50811 Malignant neoplasm of overlapping sites of right female breast: Secondary | ICD-10-CM

## 2020-06-28 NOTE — Progress Notes (Signed)
  Subjective:"doing ok, no changes"     Patient ID: [name] [name], female   DOB: 03-Jun-1946, 74 y.o.   MRN: [id number]  HPI  Recently stopped chemo without plans to restart treatment.    Review of Systems     Objective:   Physical Exam N/a due to telephonic encounter    Assessment:   RN spoke with daughter first. Daughter only has concerns with patient's appetite that has been decreased since starting last round of chemo.  Reports patient doesn't have taste for food. Denies any known weight loss. Discussed monitoring patient's weight and appetite and if they start to decline or not improve possibly talking with PCP about appetite stimulant to assist. RN spoke with patient who reports similar concern of appetite. Discussed same options as with daughter.  Patient denies complaints of shortness of breath, nausea/vomiting or falls.  Strength and energy remain the same. Pt has upcoming MD appt to follow up. Patient denies any medication changes or questions.    Plan:    Follow up with patient in 1 month. Encouraged patient or daughter to call back if needed for changes or concerns

## 2020-06-29 ENCOUNTER — Other Ambulatory Visit: Payer: Self-pay

## 2020-07-12 ENCOUNTER — Inpatient Hospital Stay: Payer: Medicare Other | Attending: Oncology | Admitting: Oncology

## 2020-07-12 DIAGNOSIS — C50911 Malignant neoplasm of unspecified site of right female breast: Secondary | ICD-10-CM | POA: Diagnosis not present

## 2020-07-12 DIAGNOSIS — I6389 Other cerebral infarction: Secondary | ICD-10-CM | POA: Diagnosis not present

## 2020-07-12 DIAGNOSIS — C50811 Malignant neoplasm of overlapping sites of right female breast: Secondary | ICD-10-CM | POA: Diagnosis not present

## 2020-07-12 DIAGNOSIS — Z170 Estrogen receptor positive status [ER+]: Secondary | ICD-10-CM

## 2020-07-12 NOTE — Progress Notes (Signed)
[place]  [phone] [phone]    ID: [name] [name] DOB: [birth year]  MR#: [id number]  FXT#:[id number]  Patient Care Team: [name], MD as PCP - General (Internal Medicine) Waynetta Sandy, MD as Consulting Physician (Vascular Surgery) Patsey Berthold, NP as Nurse Practitioner (Cardiology) [name], MD as Consulting Physician (Physical Medicine and Rehabilitation) [name], [name], NP (Inactive) as Nurse Practitioner (Vascular Surgery) Edgardo Roys, PsyD as Consulting Physician (Psychology) Mashanda Ishibashi, [name], MD as Consulting Physician (Oncology) [name], MD as Consulting Physician (General Surgery) Eppie Gibson, MD as Attending Physician (Radiation Oncology) OTHER MD:  I connected with Webb Laws on 07/12/20 at  3:00 PM EDT by video enabled telemedicine visit and verified that I am speaking with the correct person using two identifiers.   I discussed the limitations, risks, security and privacy concerns of performing an evaluation and management service by telemedicine and the availability of in-person appointments. I also discussed with the patient that there may be a patient responsible charge related to this service. The patient expressed understanding and agreed to proceed.   Other persons participating in the visit and their role in the encounter: Patient's daughter [name]  Patient's location: Home Provider's location: [place]    CHIEF COMPLAINT: Inflammatory breast cancer  CURRENT TREATMENT: observation/palliative care; considering palliative radiation   INTERVAL HISTORY: [name] [name] was contacted today for follow-up of her inflammatory breast cancer.  Her daughter also participated in this visit which was carried through [place] as the video connection did not work.  Since her last visit here, [name] [name] has not undergone any additional studies.    She tells me there is now a  red blistering mass in her breast different from the ones before.   REVIEW OF SYSTEMS: [name] [name] is doing "pretty good".  She has no pain, and says her appetite is variable and her weight is stable.  She denies any cough phlegm production or pleurisy.  She has received the [place] vaccine x2 with no complications.  She says her bowel movements were a little loose but now are better.  Urine is fine.  She denies any headaches or vision changes there have been no recent falls.  A detailed review of systems was otherwise noncontributory   HISTORY OF CURRENT ILLNESS: From the original intake note:  [name] [name] presented with right breast discoloration and redness. The right breast also had an area of hardness and nipple retraction.  She did not bring this to medical attention but her daughter did mention it when the patient had neurologic follow-up on 08/10/2018.  At that time the nurse practitioner, [name], noted right nipple inversion, hard breast, and red non-ulcerated lesions on the breast.  The patient then underwent bilateral diagnostic mammography with tomography and right breast ultrasonography at [facility] on 08/11/2018 (this is her first ever mammogram) showing: breast density category B. There was a round 2.5 cm mass at the 1 o'clock upper inner quadrant of the right breast.  By palpation this measured approximately 10 cm.  Sonographically the irregular hypoechoic mass measured 2.7 cm. There was also a hypoechoic mass in the right breast at 9 o'clock upper outer quadrant measuring 3.8 cm. There was an abnormal right axillary lymph node with cortical thickening.   Accordingly on 08/19/2018 she proceeded to biopsy of the right breast areas in question. The pathology from this procedure showed [phone]): At the 1 o'clock: invasive lobular carcinoma, grade II. Prognostic indicators significant for: estrogen  receptor, 60% positive with moderate staining intensity and progesterone receptor, 0%  negative. Proliferation marker Ki67 at 20%. HER2 negative with an immunohistochemistry of (1+).  At the 9 o'clock: invasive lobular carcinoma, grade II. Prognostic indicators significant for: estrogen receptor, 90% positive with strong staining intensity and progesterone receptor, 0% negative. Proliferation marker Ki67 at 30%. HER2 negative with an immunohistochemistry of (1+).  The patient's subsequent history is as detailed below.   PAST MEDICAL HISTORY: Past Medical History:  Diagnosis Date  . Anxiety   . Cancer [facility])    breast cancer- right  . Carotid stenosis   . Depression    situational  . Dyspnea    with much ambulation  . GERD (gastroesophageal reflux disease)   . Hypertension   . Stroke ([place]) 04/01/2017   a little weakness right side leg and hand, a little memry loss    PAST SURGICAL HISTORY: Past Surgical History:  Procedure Laterality Date  . ABDOMINAL HYSTERECTOMY    . ENDARTERECTOMY Left 04/08/2017   Procedure: ENDARTERECTOMY CAROTID;  Surgeon: Waynetta Sandy, MD;  Location: [place];  Service: Vascular;  Laterality: Left;  . PATCH ANGIOPLASTY Left 04/08/2017   Procedure: PATCH ANGIOPLASTY Left Carotid;  Surgeon: Waynetta Sandy, MD;  Location: [place];  Service: Vascular;  Laterality: Left;  . PORTACATH PLACEMENT Left 09/07/2018   Procedure: INSERTION PORT-A-CATH LEFT INTERNAL JUGULAR;  Surgeon: [name], MD;  Location: [place];  Service: General;  Laterality: Left;  total hysterectomy with bilateral salpingo-oophorectomy in her 40's no hormone replacement   FAMILY HISTORY: Family History  Problem Relation Age of Onset  . Cancer Mother        ovarian   The patient's father is alive at age 72. The patient's mother died at age 72 due to ovarian cancer. The patient had 3 brothers and 3 sisters. She denies a history of breast, pancreatic, colon, or other cancers in the family.    GYNECOLOGIC HISTORY:  No LMP recorded. Patient has had a  hysterectomy. Menarche: Age at first live birth: 74 years old She is GX P1.  She is status post total hysterectomy with BSO in her early 34's. She did not use HRT.    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. She is divorced. At home is just herself, but her two grandsons regularly come to visit and stay with her. The patient's daughter, [name] works in administration for [employer]. The patient's 64 y.o. grandson is a Freight forwarder for [employer]. The patient's 56 y.o. grandson works in [employer].    ADVANCED DIRECTIVES: Not in place.  At the 08/26/2018 visit the patient was given the appropriate documents to complete and notarized at her discretion.   HEALTH MAINTENANCE: Social History   Tobacco Use  . Smoking status: Never Smoker  . Smokeless tobacco: Never Used  Vaping Use  . Vaping Use: Never used  Substance Use Topics  . Alcohol use: No  . Drug use: No     Colonoscopy: Never  PAP: Status post hysterectomy  Bone density: Never   No Known Allergies  Current Outpatient Medications  Medication Sig Dispense Refill  . amLODipine (NORVASC) 10 MG tablet     . atorvastatin (LIPITOR) 80 MG tablet TAKE 1 TABLET BY MOUTH ONCE DAILY FOR 14 DAYS    . clopidogrel (PLAVIX) 75 MG tablet TAKE 1 TABLET BY MOUTH ONCE DAILY FOR 14 DAYS    . latanoprost (XALATAN) 0.005 % ophthalmic  solution Place 1 drop into both eyes daily.    Marland Kitchen lisinopril-hydrochlorothiazide (ZESTORETIC) 20-12.5 MG tablet     . loratadine (CLARITIN) 10 MG tablet Take 10 mg by mouth daily as needed for allergies.    . mirtazapine (REMERON) 7.5 MG tablet Take by mouth. (Patient not taking: Reported on 06/07/2020)    . potassium chloride SA (K-DUR,KLOR-CON) 20 MEQ tablet Take 1 tablet (20 mEq total) by mouth daily. 30 tablet 0   No current facility-administered medications for this visit.    OBJECTIVE: African-American woman who appears older than stated age  There were no vitals filed  for this visit. Wt Readings from Last 3 Encounters:  06/07/20 (!) 207 lb (93.9 kg)  05/24/20 207 lb 12.8 oz (94.3 kg)  04/19/20 216 lb 4.8 oz (98.1 kg)   There is no height or weight on file to calculate BMI.    ECOG FS:2 - Symptomatic, <50% confined to bed  Telemedicine visit 07/12/2020 By video visit today in addition to the dark palpable nodules previously noted in the right breast there are now what appears to be 2 contiguous erythematous fungating lesions with ulceration   Right breast 05/24/2020     Right breast 04/19/2020      Right breast 02/02/2020     Right breast 08/12/2019    Right Breast 04/07/2019   LAB RESULTS:  CMP     Component Value Date/Time   NA 140 05/24/2020 1522   K 3.5 05/24/2020 1522   CL 108 05/24/2020 1522   CO2 22 05/24/2020 1522   GLUCOSE 109 (H) 05/24/2020 1522   BUN 16 05/24/2020 1522   CREATININE 1.05 (H) 05/24/2020 1522   CREATININE 1.11 (H) 11/03/2018 1017   CALCIUM 9.1 05/24/2020 1522   PROT 7.5 05/24/2020 1522   ALBUMIN 3.4 (L) 05/24/2020 1522   AST 14 (L) 05/24/2020 1522   AST 32 11/03/2018 1017   ALT 13 05/24/2020 1522   ALT 48 (H) 11/03/2018 1017   ALKPHOS 160 (H) 05/24/2020 1522   BILITOT 0.3 05/24/2020 1522   BILITOT 0.5 11/03/2018 1017   GFRNONAA 53 (L) 05/24/2020 1522   GFRNONAA 50 (L) 11/03/2018 1017   GFRAA >60 05/24/2020 1522   GFRAA 58 (L) 11/03/2018 1017    No results found for: TOTALPROTELP, ALBUMINELP, A1GS, A2GS, BETS, BETA2SER, GAMS, MSPIKE, SPEI  No results found for: KPAFRELGTCHN, LAMBDASER, KAPLAMBRATIO  Lab Results  Component Value Date   WBC 7.8 05/24/2020   NEUTROABS 3.5 05/24/2020   HGB 10.5 (L) 05/24/2020   HCT 35.0 (L) 05/24/2020   MCV 75.1 (L) 05/24/2020   PLT 212 05/24/2020    No results found for: LABCA2  No components found for: NGEXBM841  No results for input(s): INR in the last 168 hours.  No results found for: LABCA2  No results found for: LKG401  No results found  for: UUV253  No results found for: GUY403  Lab Results  Component Value Date   CA2729 78.6 (H) 08/18/2019    No components found for: HGQUANT  No results found for: CEA1 / No results found for: CEA1   No results found for: AFPTUMOR  No results found for: CHROMOGRNA  No results found for: HGBA, HGBA2QUANT, HGBFQUANT, HGBSQUAN (Hemoglobinopathy evaluation)   No results found for: LDH  No results found for: IRON, TIBC, IRONPCTSAT (Iron and TIBC)  No results found for: FERRITIN  Urinalysis    Component Value Date/Time   COLORURINE AMBER (A) 04/01/2017 0920   APPEARANCEUR CLOUDY (A)  04/01/2017 0920   LABSPEC 1.021 04/01/2017 0920   PHURINE 5.0 04/01/2017 0920   GLUCOSEU NEGATIVE 04/01/2017 0920   HGBUR MODERATE (A) 04/01/2017 0920   BILIRUBINUR NEGATIVE 04/01/2017 0920   KETONESUR 80 (A) 04/01/2017 0920   PROTEINUR NEGATIVE 04/01/2017 0920   NITRITE NEGATIVE 04/01/2017 0920   LEUKOCYTESUR LARGE (A) 04/01/2017 0920    STUDIES: No results found.   ELIGIBLE FOR AVAILABLE RESEARCH PROTOCOL: no   ASSESSMENT: 74 y.o. [place], [place] woman status post right breast biopsy of overlapping sites on 08/19/2018 for a clinical T4 N1, stage IIIB invasive lobular carcinoma, estrogen receptor positive, progesterone receptor negative, HER-2 not amplified, with an MIB-1 of 20%.  (a) CT chest/abd/pelvis 09/02/2018 and bone scan 09/10/2018 show no evidence of stage IV disease  (b) breast MRI 09/14/2018 confirms a multicentric 12.6 cm right breast mass infiltrating pectpralis but w/o associated adenopathy  (1) genetics testing to be scheduled  (2) neoadjuvant chemotherapy consisting of cyclophosphamide, methotrexate and fluorouracil every 21 days x 8, started 09/16/2018, last dose 12/18/2018  (a) CMF chemotherapy stopped after 4 cycles, with no evidence of response by MRI NOV 2019  (5) anastrozole started February 2019  (a) bone density to be obtained  (b) palbociclib added  01/19/2019 at 125 mg daily, 21/7  (c) palbociclib dose decreased to 100 mg daily 21 on 7 off, starting with second cycle, April 2020  (d) breast MRI 05/05/2019 shows slight improvement, no evidence of progression, however the tumor is not yet resectable.  Chest CT scan same day shows no evidence of metastatic disease  (e) anastrozole and palbociclib discontinued 08/12/2019 with no significant response  (6) foundation 1/PD-L1 testing:  (a) PD-L1 requested 12/18/2018--insufficient tissue  (b) foundation one testing shows the microsatellite status to be stable, and the mutational burden to be 16/Mb  (c) there are potentially actionable mutations in AKT3, TSC1, KRAS  MAPK1 and RB1  (7) started paclitaxel 08/18/2019, given days 1 and 8 of each 21-28-day cycle  (a) changed to every 14 days beginning with second cycle per patient preference  (b) discontinued after 10/27/2019 dose with no evidence of response (minimal evidence of progression)  (8) fulvestrant started 11/10/2019.  (a) discontinued after 02/02/2020 dose as no evidence of response  (9) definitive surgery considered, felt not to be possible given local extent of disease  (10) adjuvant radiation considered May 2021, patient opted against  (11) started exemestane mid May 2021   (a) started everolimus 04/17/2020--discontinued after repeated diarrhea episodes  (b) exemestane and everolimus discontinued 05/24/2020 with evidence of disease progression   PLAN: Sabriel continues to have slowly progressive disease in the right breast.  The disease is now more symptomatic to her and accordingly she is more agreeable to treatment.  Specifically I suggested  radiation.  She has been very much opposed to this in the past but now she tells me she is agreeable.  I have sent Dr. Isidore Moos her radiation physician in note asking her to consider bringing the patient in for palliative treatment  Otherwise I will check with Janiel in about 6weeks.  We  will again do that virtually.  They do have palliative care following at least on a monthly basis and that is helpful.    [name]. , MD 07/12/20 6:25 PM Medical Oncology and Hematology [facility] [place], [place] [address] Tel. [phone]    Fax. [phone]   I, Wilburn Mylar, am acting as scribe for Dr. [name]. .  I,  Lurline Del MD, have reviewed the above documentation for accuracy and completeness, and I agree with the above.    *Total Encounter Time as defined by the Centers for Medicare and Medicaid Services includes, in addition to the face-to-face time of a patient visit (documented in the note above) non-face-to-face time: obtaining and reviewing outside history, ordering and reviewing medications, tests or procedures, care coordination (communications with other health care professionals or caregivers) and documentation in the medical record.

## 2020-07-18 MED FILL — LATANOPROST 0.005% OPTH SOL: 0.005 | 25 days supply | Qty: 3 | Fill #2

## 2020-08-02 ENCOUNTER — Telehealth: Payer: Self-pay

## 2020-08-02 NOTE — Telephone Encounter (Signed)
[name] returned call from previous note. Daughter, [name] understands she should consult with PCP regarding (L) food pain. Daughter states pain did not start until after a trip to [place]. Advised daughter that pt may need xray or doppler before putting in a referral for PT and the pt's PCP can assist with this. [name] verbalizes thanks and understanding, and states she will call PCP.

## 2020-08-02 NOTE — Progress Notes (Signed)
Radiation Oncology         [phone]) [phone] ________________________________  Name: [name] [name] MRN: [id number]  Date: 08/04/2020  DOB: May 30, 1946  Follow-Up Visit Note  Outpatient  CC: [name], MD  [name], MD  Diagnosis:      ICD-10-CM   1. Malignant neoplasm of overlapping sites of right breast in female, estrogen receptor positive ([place])  C50.811    Z17.0      Cancer Staging Malignant neoplasm of overlapping sites of right breast in female, estrogen receptor positive ([place]) Staging form: Breast, AJCC 8th Edition - Clinical stage from 08/26/2018: Stage IIIB (cT4b, cN1, cM0, G2, ER+, PR-, HER2-) - Unsigned    CHIEF COMPLAINT: Here to discuss management of right breast cancer  Narrative:  The patient returns today for follow-up. She was seen in the multidisciplinary breast clinic on 08/26/2018 and was then seen in follow-up on 03/03/2020. At that time, her locally advanced disease in the right breast had been refractory to neoadjuvant chemotherapy/systemic therapy. Previously, she was not felt to be a surgical candidate and the systemic therapy had not rendered any significant results to facilitate surgery. I spoke with her and her daughter about potential palliative courses of radiotherapy to the right breast given over 1 to 6 weeks with hypofractionated versus standard fractionated methods. The patient had agreed to undergo three weeks worth of radiation. However, on 03/17/2020, the patient's daughter called and stated that the she no longer wanted to proceed with radiation. The patient was started on Exemestane on 04/03/2020 and Everolimus on 04/17/2020 under the care of Dr. Jana Hakim. Of note, they were both discontinued on 05/24/2020 secondary to disease progression (new nodules both laterally and medially around the right breast). At that time, they discussed radiation vs a second opinion at [facility] vs comfort care with hospice. The patient was unsure of which route she wanted  to take. However, on 07/12/2020, given the symptomatic progression of her disease, she agreed to consider radiation therapy.  Symptomatically, the patient reports: right breast is significantly more swollen than left.     ALLERGIES:  has No Known Allergies.  Meds: Current Outpatient Medications  Medication Sig Dispense Refill  . amLODipine (NORVASC) 10 MG tablet     . atorvastatin (LIPITOR) 80 MG tablet TAKE 1 TABLET BY MOUTH ONCE DAILY FOR 14 DAYS    . clopidogrel (PLAVIX) 75 MG tablet TAKE 1 TABLET BY MOUTH ONCE DAILY FOR 14 DAYS    . latanoprost (XALATAN) 0.005 % ophthalmic solution Place 1 drop into both eyes daily.    Marland Kitchen lisinopril-hydrochlorothiazide (ZESTORETIC) 20-12.5 MG tablet     . loratadine (CLARITIN) 10 MG tablet Take 10 mg by mouth daily as needed for allergies.    . potassium chloride SA (K-DUR,KLOR-CON) 20 MEQ tablet Take 1 tablet (20 mEq total) by mouth daily. 30 tablet 0  . mirtazapine (REMERON) 7.5 MG tablet Take by mouth.  (Patient not taking: Reported on 08/04/2020)     No current facility-administered medications for this encounter.    Physical Findings:  height is _0  (1.676 m). Her oral temperature is 98.4 F (36.9 C). Her blood pressure is 152/67 (abnormal) and her pulse is 99. Her respiration is 18 and oxygen saturation is 100%. .     General: Alert and oriented, in no acute distress Psychiatric: Judgment and insight are intact. Affect is appropriate. Breast exam reveals fungating tumor protruding from right upper breast, it is oozing.  Photographs will be taken during treatment planning today.  The patient has diffuse firmness throughout the breast indicative of diffuse tumor, with patches of erythema throughout the skin.    ECOG 3  Lab Findings: Lab Results  Component Value Date   WBC 7.8 05/24/2020   HGB 10.5 (L) 05/24/2020   HCT 35.0 (L) 05/24/2020   MCV 75.1 (L) 05/24/2020   PLT 212 05/24/2020    _0 @  Radiographic Findings: No  results found.  Impression/Plan: Locally advanced right breast cancer, not a surgical candidate.  Her disease has progressed on systemic therapy.  She previously declined radiation therapy but represents to reconsider.  We discussed the role of palliative radiotherapy.  I offered a 1 week course of hypofractionated palliative radiation therapy which I think will be a realistic balance for the patient to sustain.  The patient likes the idea of coming in for minimal treatments while getting a higher dose per treatment with the goal of palliation.  The patient and her daughter understand that the goal is to shrink down her disease, but that sometimes radiation merely stabilizes the disease.  No guarantees of treatment were given.  We will proceed with CT simulation today and start treatment in about 1 week.  Side effects may include but not necessarily be limited to tissue injury or tenderness of the breast, fatigue, skin irritation and desquamation.  We discussed techniques that I will take to minimize exposure to her lungs.  We discussed measures to reduce the risk of infection during the COVID-19 pandemic.  She has received 2 vaccines thus far and would like to wait a little while until her booster.  I encouraged  her to let us know when she would be willing to pursue the booster so we can give it to her in the [facility].   On date of service, in total, I spent 40 minutes on this encounter. Patient was seen in person.  _____________________________________   Eppie Gibson, MD  This document serves as a record of services personally performed by Eppie Gibson, MD. It was created on his behalf by Clerance Lav, a trained medical scribe. The creation of this record is based on the scribe's personal observations and the provider's statements to them. This document has been checked and approved by the attending provider.

## 2020-08-02 NOTE — Telephone Encounter (Signed)
Received call from pt's daughter, [name] [name] stating her mom, the pt is experience "(L) foot aching" and asks if she can have another referral tp PT as it has been so long since she has been, and PT is asking for new referral. [name] also asks if she needs to see her PCP regarding this matter. This nurse attempted to return [name]'s call and LVM to return call.

## 2020-08-03 MED FILL — AMLODIPINE BESYLATE 10 MG T: 10 | 90 days supply | Qty: 90 | Fill #0

## 2020-08-03 NOTE — Progress Notes (Signed)
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 Breast, right, needle core biopsy, 1 o'clock, 7cmn - INVASIVE MAMMARY CARCINOMA. 2. Breast, right, needle core biopsy, 9 o'clock, 2cmn - INVASIVE MAMMARY CARCINOMA. Microscopic Comment 1. The carcinoma appears grade 2.  Receptor Status: ER(60%), PR (0%), Her2-neu (negative), Ki-67(20%)  Did patient present with symptoms (if so, please note symptoms) or was this found on screening mammography?:  Patient presented with right breast discoloration and redness. The right breast also had an area of hardness and nipple retraction.She did not bring this to medical attention but her daughter did mention it when the patient had neurologic follow-up on 08/10/2018. At that time the nurse practitioner, [name], noted right nipple inversion, hard breast, and red non-ulcerated lesions on the breast.The patient then underwent bilateral diagnostic mammography with tomography and right breast ultrasonography at [facility] on 08/11/2018 (this is her first ever mammogram) showing: breast density category B.   Past/Anticipated interventions by surgeon, if any: None--Previously, she was not felt to be a surgical candidate and the systemic therapy had not rendered any significant results to facilitate surgery  Past/Anticipated interventions by medical oncology, if any:  Under care of Dr. Sarajane Jews Magrinat: 07/12/2020 (1) neoadjuvant chemotherapy consisting of cyclophosphamide, methotrexate and fluorouracil every 21 days x 8, started 09/16/2018, last dose 12/18/2018 (a) CMF chemotherapy stoppedafter 4 cycles, with no evidence of response by MRI NOV 2019 (2) anastrozolestarted February 2019 (a) bone density to be obtained (b) palbociclibadded 01/19/2019 at 125 mg daily, 21/7 (c) palbociclib dose decreased to 100 mg  daily 21 on 7 off, starting with second cycle, April 2020 (d) breast MRI 05/05/2019 shows slight improvement, no evidence of progression, however the tumor is not yet resectable. Chest CT scan same day shows no evidence of metastatic disease (e) anastrozole and palbociclib discontinued10/11/2018 with no significant response (3) foundation 1/PD-L1 testing: (a) PD-L1 requested 12/18/2018--insufficient tissue (b) foundation one testing shows the microsatellite status to be stable, and the mutational burden to be 16/Mb (c) there are potentially actionable mutations in AKT3, TSC1, KRAS MAPK1 and RB1 (4) started paclitaxel10/05/2019, given days 1 and 8 of each 21-28-day cycle (a) changed to every 14 days beginning with second cycle per patient preference (b) discontinuedafter 10/27/2019 dose with no evidence of response (minimal evidence of progression) (5) fulvestrant started 11/10/2019. (a) discontinued after 02/02/2020 dose as no evidence of response (6) definitive surgery considered, felt not to be possible given local extent of disease (7) adjuvant radiation to follow (8) genetics testing to be scheduled (9) started exemestane mid May 2021              (a) started everolimus 04/17/2020--discontinued after repeated diarrhea episodes             (b) exemestane and everolimus discontinued 05/24/2020 with evidence of disease progression --I will check with Sakeena in about 6weeks.  We will again do that virtually.  Lymphedema issues, if any:  Patient states right breast is significantly more swollen than left.    Pain issues, if any:  Patient denies   SAFETY ISSUES:  Prior radiation? No  Pacemaker/ICD? No  Possible current pregnancy? No--abdominal hysterectomy  Is the patient on methotrexate? No  Current Complaints / other details: Since her last visit, the patient had agreed to undergo  three weeks worth of radiation. However, on 03/17/2020, the patient's daughter called and stated that the she no loner wanted to proceed with radiation but  only antiestrogen therapy. The patient was started on Exemestane on 04/03/2020 and Everolimus on 04/17/2020 under the care of Dr. Jana Hakim. Of note, they were both discontinued on 05/24/2020 secondary to disease progression (new nodules both laterally and medially around the right breast). At that time, they discussed radiation vs a second opinion at [facility] vs comfort care with hospice. The patient was unsure of which route she wanted to take. However, on 07/12/2020, given the symptomatic progression of her disease, she agreed to consider radiation therapy

## 2020-08-04 ENCOUNTER — Encounter: Payer: Self-pay | Admitting: Radiation Oncology

## 2020-08-04 ENCOUNTER — Ambulatory Visit
Admission: RE | Admit: 2020-08-04 | Discharge: 2020-08-04 | Disposition: A | Discharge status: Home / Self Care | Payer: Medicare Other | Source: Ambulatory Visit | Attending: Radiation Oncology | Admitting: Radiation Oncology

## 2020-08-04 ENCOUNTER — Other Ambulatory Visit: Payer: Self-pay

## 2020-08-04 VITALS — BP 152/67 | HR 99 | Temp 98.4°F | Resp 18 | Ht 66.0 in

## 2020-08-04 DIAGNOSIS — Z170 Estrogen receptor positive status [ER+]: Secondary | ICD-10-CM

## 2020-08-04 DIAGNOSIS — Z79899 Other long term (current) drug therapy: Secondary | ICD-10-CM | POA: Diagnosis not present

## 2020-08-04 DIAGNOSIS — C50811 Malignant neoplasm of overlapping sites of right female breast: Secondary | ICD-10-CM | POA: Insufficient documentation

## 2020-08-04 DIAGNOSIS — Z79811 Long term (current) use of aromatase inhibitors: Secondary | ICD-10-CM | POA: Diagnosis not present

## 2020-08-04 DIAGNOSIS — N6310 Unspecified lump in the right breast, unspecified quadrant: Secondary | ICD-10-CM | POA: Diagnosis not present

## 2020-08-09 DIAGNOSIS — C50811 Malignant neoplasm of overlapping sites of right female breast: Secondary | ICD-10-CM | POA: Diagnosis not present

## 2020-08-09 DIAGNOSIS — Z170 Estrogen receptor positive status [ER+]: Secondary | ICD-10-CM | POA: Diagnosis not present

## 2020-08-14 ENCOUNTER — Ambulatory Visit
Admission: RE | Admit: 2020-08-14 | Discharge: 2020-08-14 | Disposition: A | Discharge status: Home / Self Care | Payer: Medicare Other | Source: Ambulatory Visit | Attending: Radiation Oncology | Admitting: Radiation Oncology

## 2020-08-14 DIAGNOSIS — C50811 Malignant neoplasm of overlapping sites of right female breast: Secondary | ICD-10-CM | POA: Diagnosis not present

## 2020-08-14 DIAGNOSIS — Z510 Encounter for antineoplastic radiation therapy: Secondary | ICD-10-CM | POA: Insufficient documentation

## 2020-08-14 DIAGNOSIS — Z170 Estrogen receptor positive status [ER+]: Secondary | ICD-10-CM | POA: Diagnosis not present

## 2020-08-14 MED ORDER — ALRA NON-METALLIC DEODORANT (RAD-ONC)
1.0000 "application " | Freq: Once | TOPICAL | Status: AC
  Administered 2020-08-14: 1 via TOPICAL

## 2020-08-14 MED ORDER — RADIAPLEXRX EX GEL: Freq: Once | CUTANEOUS | Status: AC

## 2020-08-15 ENCOUNTER — Ambulatory Visit
Admission: RE | Admit: 2020-08-15 | Discharge: 2020-08-15 | Disposition: A | Discharge status: Home / Self Care | Payer: Medicare Other | Source: Ambulatory Visit | Attending: Radiation Oncology | Admitting: Radiation Oncology

## 2020-08-15 DIAGNOSIS — Z510 Encounter for antineoplastic radiation therapy: Secondary | ICD-10-CM | POA: Diagnosis not present

## 2020-08-15 DIAGNOSIS — Z170 Estrogen receptor positive status [ER+]: Secondary | ICD-10-CM | POA: Diagnosis not present

## 2020-08-15 DIAGNOSIS — C50811 Malignant neoplasm of overlapping sites of right female breast: Secondary | ICD-10-CM | POA: Diagnosis not present

## 2020-08-16 ENCOUNTER — Ambulatory Visit
Admission: RE | Admit: 2020-08-16 | Discharge: 2020-08-16 | Disposition: A | Discharge status: Home / Self Care | Payer: Medicare Other | Source: Ambulatory Visit | Attending: Radiation Oncology | Admitting: Radiation Oncology

## 2020-08-16 DIAGNOSIS — Z510 Encounter for antineoplastic radiation therapy: Secondary | ICD-10-CM | POA: Diagnosis not present

## 2020-08-16 DIAGNOSIS — C50811 Malignant neoplasm of overlapping sites of right female breast: Secondary | ICD-10-CM | POA: Diagnosis not present

## 2020-08-16 DIAGNOSIS — Z170 Estrogen receptor positive status [ER+]: Secondary | ICD-10-CM | POA: Diagnosis not present

## 2020-08-17 ENCOUNTER — Ambulatory Visit
Admission: RE | Admit: 2020-08-17 | Discharge: 2020-08-17 | Disposition: A | Discharge status: Home / Self Care | Payer: Medicare Other | Source: Ambulatory Visit | Attending: Radiation Oncology | Admitting: Radiation Oncology

## 2020-08-17 DIAGNOSIS — Z510 Encounter for antineoplastic radiation therapy: Secondary | ICD-10-CM | POA: Diagnosis not present

## 2020-08-17 DIAGNOSIS — Z170 Estrogen receptor positive status [ER+]: Secondary | ICD-10-CM | POA: Diagnosis not present

## 2020-08-17 DIAGNOSIS — C50811 Malignant neoplasm of overlapping sites of right female breast: Secondary | ICD-10-CM | POA: Diagnosis not present

## 2020-08-18 ENCOUNTER — Encounter: Payer: Self-pay | Admitting: Radiation Oncology

## 2020-08-18 ENCOUNTER — Ambulatory Visit
Admission: RE | Admit: 2020-08-18 | Discharge: 2020-08-18 | Disposition: A | Discharge status: Home / Self Care | Payer: Medicare Other | Source: Ambulatory Visit | Attending: Radiation Oncology | Admitting: Radiation Oncology

## 2020-08-18 ENCOUNTER — Other Ambulatory Visit: Payer: Self-pay

## 2020-08-18 DIAGNOSIS — C50811 Malignant neoplasm of overlapping sites of right female breast: Secondary | ICD-10-CM | POA: Diagnosis not present

## 2020-08-18 DIAGNOSIS — Z510 Encounter for antineoplastic radiation therapy: Secondary | ICD-10-CM | POA: Diagnosis not present

## 2020-08-18 DIAGNOSIS — Z170 Estrogen receptor positive status [ER+]: Secondary | ICD-10-CM | POA: Diagnosis not present

## 2020-08-31 ENCOUNTER — Telehealth: Payer: Self-pay

## 2020-08-31 NOTE — Telephone Encounter (Signed)
Patient's daughter [name] left VM asking how much longer she should be applying Radiaplex to radiation site, and to see where she could purchase more non-adherent dressings to cover tumor site.   Returned [name]'s call and inquired how skin was looking in treatment field. She reported that site still appeared dry and hyperpigmented. Advised her to continue to apply Radiaplex daily until follow-up with Dr. Isidore Moos in few weeks. Also informed her that she could begin applying a lotion or oil with vitamin E to area once patient was about a month out from completing radiation. Provided options of different drug stores and medical supplies stores where non-adherent pads could be purchased. [name] verbalized understanding and denied any other needs at this time. She knows to call back should something arise before patient's follow-up appointment

## 2020-09-07 ENCOUNTER — Telehealth: Payer: Self-pay

## 2020-09-07 NOTE — Telephone Encounter (Signed)
4:09PM: Palliative care SW outreached patient/patients daughter [name] to complete monthly telephonic visit.  Left HIPPA compliant VM. Awaiting return call.

## 2020-09-11 ENCOUNTER — Telehealth: Payer: Self-pay

## 2020-09-11 NOTE — Telephone Encounter (Signed)
1115AM: Palliative care SW returned patients daughter, [name], phone call.   [name] inquired about respite days for patient. SW informed daughter that unfortunately respite stays are not covered under Palliative care. Hospice does have a respite stay benefit. Daughter inquired about Hospice service, SW provided information. Daughter to outreach SW at a later date for a palliative care in home visit to discuss hospice and respite stay options further.

## 2020-09-21 NOTE — Progress Notes (Signed)
Ms. Lanting presents today for follow-up after completing radiation to her right breast on 08/18/2020  Skin: patient's daughter reports redness has improved, and weeping has lessned. The skin affected by the tumor remains very dry Pain: Patient denies Fatigue: Still present and patient feels it impacts her ability to do ADLs ROM: No changes. Had limited ROM prior to XRt, but it has not worsened Lymphedema: Patient denies  Other issues of note: Daughter reports patient's appetite seems to have decresed and everything is either "too sweet" or "too salty". Patient reports she is having difficulty staying asleep during the night. Otherwise no new complaints

## 2020-09-22 ENCOUNTER — Other Ambulatory Visit: Payer: Self-pay

## 2020-09-22 ENCOUNTER — Ambulatory Visit
Admission: RE | Admit: 2020-09-22 | Discharge: 2020-09-22 | Disposition: A | Discharge status: Home / Self Care | Payer: Medicare Other | Source: Ambulatory Visit | Attending: Radiation Oncology | Admitting: Radiation Oncology

## 2020-09-22 DIAGNOSIS — C50811 Malignant neoplasm of overlapping sites of right female breast: Secondary | ICD-10-CM | POA: Diagnosis not present

## 2020-09-22 DIAGNOSIS — Z170 Estrogen receptor positive status [ER+]: Secondary | ICD-10-CM | POA: Insufficient documentation

## 2020-09-22 DIAGNOSIS — Z923 Personal history of irradiation: Secondary | ICD-10-CM | POA: Diagnosis not present

## 2020-09-22 DIAGNOSIS — Z79899 Other long term (current) drug therapy: Secondary | ICD-10-CM | POA: Diagnosis not present

## 2020-09-23 MED FILL — CLOPIDOGREL 75 MG TABLET: 75 | 90 days supply | Qty: 90 | Fill #1

## 2020-09-23 MED FILL — LISINOPRIL-HCTZ 20-12.5 MG: 20-12.5 | 90 days supply | Qty: 90 | Fill #1

## 2020-09-25 ENCOUNTER — Encounter: Payer: Self-pay | Admitting: Radiation Oncology

## 2020-09-25 NOTE — Progress Notes (Signed)
Radiation Oncology         [phone] ________________________________  Name: Tieara Flitton MRN: [id number]  Date: 09/22/2020  DOB: 06/30/1946  Follow-Up Visit Note  Outpatient  CC: [name], MD  Magrinat, [name], MD  Diagnosis and Prior Radiotherapy:    ICD-10-CM   1. Malignant neoplasm of overlapping sites of right breast in female, estrogen receptor positive ([place])  C50.811    Z17.0     CHIEF COMPLAINT: Here for follow-up and surveillance of right breast cancer  Narrative:  The patient returns today for routine follow-up.  Ms. Cormier presents today for follow-up after completing radiation to her right breast on 08/18/2020  Skin: patient's daughter reports redness has improved, and weeping has lessned. The skin affected by the tumor remains very dry Pain: Patient denies Fatigue: Still present and patient feels it impacts her ability to do ADLs ROM: No changes. Had limited ROM prior to XRT, but it has not worsened Lymphedema: Patient denies  Other issues of note: Daughter reports patient's appetite seems to have decresed and everything is either "too sweet" or "too salty". Patient reports she is having difficulty staying asleep during the night. Otherwise no new complaints                              ALLERGIES:  has No Known Allergies.  Meds: Current Outpatient Medications  Medication Sig Dispense Refill  . amLODipine (NORVASC) 10 MG tablet     . atorvastatin (LIPITOR) 80 MG tablet TAKE 1 TABLET BY MOUTH ONCE DAILY FOR 14 DAYS    . clopidogrel (PLAVIX) 75 MG tablet TAKE 1 TABLET BY MOUTH ONCE DAILY FOR 14 DAYS    . latanoprost (XALATAN) 0.005 % ophthalmic solution Place 1 drop into both eyes daily.    Marland Kitchen lisinopril-hydrochlorothiazide (ZESTORETIC) 20-12.5 MG tablet     . loratadine (CLARITIN) 10 MG tablet Take 10 mg by mouth daily as needed for allergies.    . mirtazapine (REMERON) 7.5 MG tablet Take by mouth.  (Patient not taking: Reported on 08/04/2020)    .  potassium chloride SA (K-DUR,KLOR-CON) 20 MEQ tablet Take 1 tablet (20 mEq total) by mouth daily. 30 tablet 0   No current facility-administered medications for this encounter.    Physical Findings: The patient is in no acute distress. Patient is alert and oriented.  vitals were not taken for this visit. .    Right breast continues to be hard with diffuse dryness and heterogeneity of pigmentation.  She continues to have an exophytic tumor from the upper breast but this is decreased in size with significant reduction in weeping.   Lab Findings: Lab Results  Component Value Date   WBC 7.8 05/24/2020   HGB 10.5 (L) 05/24/2020   HCT 35.0 (L) 05/24/2020   MCV 75.1 (L) 05/24/2020   PLT 212 05/24/2020    Radiographic Findings: No results found.  Impression/Plan: Healing well from short course radiotherapy to the breast tissue with good palliative effect.  Continue skin care with topical Vitamin E Oil and / or moisturizing lotion of choice for at least 2 more for further healing.  She has not been using any moisturizing lotions recently but seems open to doing so moving forward.  I have contacted medical oncology so that she has follow-up plans in the future with either Dr. Jana Hakim or Wilber Bihari.  I will see her back as needed.   On date of service, in  total, I spent 10 minutes on this encounter. Patient was seen in person.  _____________________________________   Eppie Gibson, MD

## 2020-09-26 ENCOUNTER — Telehealth: Payer: Self-pay | Admitting: Oncology

## 2020-09-26 NOTE — Telephone Encounter (Signed)
Called pt daughter per 11/15 sch msg. Left message for patient with apt date and time

## 2020-10-04 NOTE — Progress Notes (Signed)
  Patient Name: [name] [name] MRN: [id number] DOB: Apr 26, 1946 Referring Physician: Lurline Del (Profile Not Attached) Date of Service: 08/18/2020 [facility], [place]                                                        End Of Treatment Note  Diagnoses: C50.811-Malignant neoplasm of overlapping sites of right female breast  Cancer Staging: Cancer Staging Malignant neoplasm of overlapping sites of right breast in female, estrogen receptor positive ([place]) Staging form: Breast, AJCC 8th Edition - Clinical stage from 08/26/2018: Stage IIIB (cT4b, cN1, cM0, G2, ER+, PR-, HER2-) - Unsigned  Intent: Palliative  Radiation Treatment Dates: 08/14/2020 through 08/18/2020 Site Technique Total Dose (Gy) Dose per Fx (Gy) Completed Fx Beam Energies  Breast, Right: Breast_Rt 3D 26/26 5.2 5/5 10X, 15X   Narrative: The patient tolerated radiation therapy relatively well.   Plan: The patient will follow-up with radiation oncology in 79mo. -----------------------------------  SEppie Gibson MD

## 2020-10-25 NOTE — Progress Notes (Signed)
[place]  [phone]) [phone] Fax:(336) [phone]    ID: [name] [name] DOB: 1946-01-30  MR#: [id number]  FGH#:[id number]  Patient Care Team: [name] Carol, [name] as PCP - General (Internal Medicine) [name] Sandy, [name] as Consulting Physician (Vascular Surgery) [name] Berthold, NP as Nurse Practitioner (Cardiology) [name] Staggers, [name] as Consulting Physician (Physical Medicine and Rehabilitation) [name], [name], NP (Inactive) as Nurse Practitioner (Vascular Surgery) [name] Roys, PsyD as Consulting Physician (Psychology) [name] [name], [name] [name] as Consulting Physician (Oncology) [name] Kussmaul, [name] as Consulting Physician (General Surgery) [name] Gibson, [name] as Attending Physician (Radiation Oncology) OTHER [name]:  I connected with [name] [name] on 10/26/20 at 10:45 AM EST by video enabled telemedicine visit and verified that I am speaking with the correct person using two identifiers.   I discussed the limitations, risks, security and privacy concerns of performing an evaluation and management service by telemedicine and the availability of in-person appointments. I also discussed with the patient that there may be a patient responsible charge related to this service. The patient expressed understanding and agreed to proceed.   Other persons participating in the visit and their role in the encounter: Patient's daughter [name] [name]  Patient's location: Home Provider's location: [place]: Inflammatory breast cancer  CURRENT TREATMENT: observation/palliative care   INTERVAL HISTORY: The patient's daughter [name] [name] was contacted today for follow-up of [name] inflammatory breast cancer.   Since her last visit here, the patient was referred back to Dr. Isidore [name] on 08/04/2020 to discuss adjuvant radiation therapy. She subsequently received hypofractionated treatment from 08/14/2020 through 08/18/2020.   REVIEW OF  SYSTEMS: [name] [name] tells me her mother did well with her radiation.  She did feel a bit tired for 2 or 3 weeks but her energy is now back to her baseline.  She says the area of the breast is now dark and continues to have crusting but it is not oozing or ulcerated.  Since he has no pain.  She is a little unsteady sometimes but there have been no falls.  Overall a detailed review of systems today was very stable.   COVID 19 VACCINATION STATUS: fully vaccinated Therapist, music)   HISTORY OF CURRENT ILLNESS: From the original intake note:  [name] [name] presented with right breast discoloration and redness. The right breast also had an area of hardness and nipple retraction.  She did not bring this to medical attention but her daughter did mention it when the patient had neurologic follow-up on 08/10/2018.  At that time the nurse practitioner, [name] [name], noted right nipple inversion, hard breast, and red non-ulcerated lesions on the breast.  The patient then underwent bilateral diagnostic mammography with tomography and right breast ultrasonography at [facility] on 08/11/2018 (this is her first ever mammogram) showing: breast density category B. There was a round 2.5 cm mass at the 1 o'clock upper inner quadrant of the right breast.  By palpation this measured approximately 10 cm.  Sonographically the irregular hypoechoic mass measured 2.7 cm. There was also a hypoechoic mass in the right breast at 9 o'clock upper outer quadrant measuring 3.8 cm. There was an abnormal right axillary lymph node with cortical thickening.   Accordingly on 08/19/2018 she proceeded to biopsy of the right breast areas in question. The pathology from this procedure showed [id number]): At the 1 o'clock: invasive lobular carcinoma, grade II. Prognostic indicators significant for: estrogen receptor, 60% positive with moderate staining intensity and progesterone receptor, 0% negative. Proliferation  marker Ki67 at 20%. HER2 negative with an  immunohistochemistry of (1+).  At the 9 o'clock: invasive lobular carcinoma, grade II. Prognostic indicators significant for: estrogen receptor, 90% positive with strong staining intensity and progesterone receptor, 0% negative. Proliferation marker Ki67 at 30%. HER2 negative with an immunohistochemistry of (1+).  The patient's subsequent history is as detailed below.   PAST MEDICAL HISTORY: Past Medical History:  Diagnosis Date  . Anxiety   . Cancer [facility])    breast cancer- right  . Carotid stenosis   . Depression    situational  . Dyspnea    with much ambulation  . GERD (gastroesophageal reflux disease)   . Hypertension   . Stroke ([place]) 04/01/2017   a little weakness right side leg and hand, a little memry loss    PAST SURGICAL HISTORY: Past Surgical History:  Procedure Laterality Date  . ABDOMINAL HYSTERECTOMY    . ENDARTERECTOMY Left 04/08/2017   Procedure: ENDARTERECTOMY CAROTID;  Surgeon: [name] Sandy, [name];  Location: [place];  Service: Vascular;  Laterality: Left;  . PATCH ANGIOPLASTY Left 04/08/2017   Procedure: PATCH ANGIOPLASTY Left Carotid;  Surgeon: [name] Sandy, [name];  Location: [place];  Service: Vascular;  Laterality: Left;  . PORTACATH PLACEMENT Left 09/07/2018   Procedure: INSERTION PORT-A-CATH LEFT INTERNAL JUGULAR;  Surgeon: [name] Kussmaul, [name];  Location: [place];  Service: General;  Laterality: Left;  total hysterectomy with bilateral salpingo-oophorectomy in her 10's no hormone replacement   FAMILY HISTORY: Family History  Problem Relation Age of Onset  . Cancer Mother        ovarian   The patient's father is alive at age 65. The patient's mother died at age 56 due to ovarian cancer. The patient had 3 brothers and 3 sisters. She denies a history of breast, pancreatic, colon, or other cancers in the family.    GYNECOLOGIC HISTORY:  No LMP recorded. Patient has had a hysterectomy. Menarche: Age at first live birth: 74 years old She is  GX P1.  She is status post total hysterectomy with BSO in her early 85's. She did not use HRT.    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. She is divorced. At home is just herself, but her two grandsons regularly come to visit and stay with her. The patient's daughter, [name] [name] works in administration for [employer]. The patient's 39 y.o. grandson is a Freight forwarder for [employer]. The patient's 35 y.o. grandson works in [employer].    ADVANCED DIRECTIVES: Not in place.  At the 08/26/2018 visit the patient was given the appropriate documents to complete and notarized at her discretion.   HEALTH MAINTENANCE: Social History   Tobacco Use  . Smoking status: Never Smoker  . Smokeless tobacco: Never Used  Vaping Use  . Vaping Use: Never used  Substance Use Topics  . Alcohol use: No  . Drug use: No     Colonoscopy: Never  PAP: Status post hysterectomy  Bone density: Never   No Known Allergies  Current Outpatient Medications  Medication Sig Dispense Refill  . amLODipine (NORVASC) 10 MG tablet     . atorvastatin (LIPITOR) 80 MG tablet TAKE 1 TABLET BY MOUTH ONCE DAILY FOR 14 DAYS    . clopidogrel (PLAVIX) 75 MG tablet TAKE 1 TABLET BY MOUTH ONCE DAILY FOR 14 DAYS    . latanoprost (XALATAN) 0.005 % ophthalmic solution Place 1 drop into both eyes daily.    Marland Kitchen lisinopril-hydrochlorothiazide (  ZESTORETIC) 20-12.5 MG tablet     . loratadine (CLARITIN) 10 MG tablet Take 10 mg by mouth daily as needed for allergies.    . mirtazapine (REMERON) 7.5 MG tablet Take by mouth.  (Patient not taking: Reported on 08/04/2020)    . potassium chloride SA (K-DUR,KLOR-CON) 20 MEQ tablet Take 1 tablet (20 mEq total) by mouth daily. 30 tablet 0   No current facility-administered medications for this visit.    OBJECTIVE:  There were no vitals filed for this visit. Wt Readings from Last 3 Encounters:  06/07/20 (!) 207 lb (93.9 kg)  05/24/20 207 lb 12.8 oz (94.3 kg)   04/19/20 216 lb 4.8 oz (98.1 kg)   There is no height or weight on file to calculate BMI.    ECOG FS:2 - Symptomatic, <50% confined to bed  Telemedicine visit 10/26/2020    Right breast 05/24/2020     Right breast 04/19/2020      Right breast 02/02/2020     Right breast 08/12/2019    Right Breast 04/07/2019   LAB RESULTS:  CMP     Component Value Date/Time   NA 140 05/24/2020 1522   K 3.5 05/24/2020 1522   CL 108 05/24/2020 1522   CO2 22 05/24/2020 1522   GLUCOSE 109 (H) 05/24/2020 1522   BUN 16 05/24/2020 1522   CREATININE 1.05 (H) 05/24/2020 1522   CREATININE 1.11 (H) 11/03/2018 1017   CALCIUM 9.1 05/24/2020 1522   PROT 7.5 05/24/2020 1522   ALBUMIN 3.4 (L) 05/24/2020 1522   AST 14 (L) 05/24/2020 1522   AST 32 11/03/2018 1017   ALT 13 05/24/2020 1522   ALT 48 (H) 11/03/2018 1017   ALKPHOS 160 (H) 05/24/2020 1522   BILITOT 0.3 05/24/2020 1522   BILITOT 0.5 11/03/2018 1017   GFRNONAA 53 (L) 05/24/2020 1522   GFRNONAA 50 (L) 11/03/2018 1017   GFRAA >60 05/24/2020 1522   GFRAA 58 (L) 11/03/2018 1017    No results found for: TOTALPROTELP, ALBUMINELP, A1GS, A2GS, BETS, BETA2SER, GAMS, MSPIKE, SPEI  No results found for: KPAFRELGTCHN, LAMBDASER, KAPLAMBRATIO  Lab Results  Component Value Date   WBC 7.8 05/24/2020   NEUTROABS 3.5 05/24/2020   HGB 10.5 (L) 05/24/2020   HCT 35.0 (L) 05/24/2020   MCV 75.1 (L) 05/24/2020   PLT 212 05/24/2020    No results found for: LABCA2  No components found for: ZOXWRU045  No results for input(s): INR in the last 168 hours.  No results found for: LABCA2  No results found for: WUJ811  No results found for: BJY782  No results found for: NFA213  Lab Results  Component Value Date   CA2729 78.6 (H) 08/18/2019    No components found for: HGQUANT  No results found for: CEA1 / No results found for: CEA1   No results found for: AFPTUMOR  No results found for: CHROMOGRNA  No results found for: HGBA,  HGBA2QUANT, HGBFQUANT, HGBSQUAN (Hemoglobinopathy evaluation)   No results found for: LDH  No results found for: IRON, TIBC, IRONPCTSAT (Iron and TIBC)  No results found for: FERRITIN  Urinalysis    Component Value Date/Time   COLORURINE AMBER (A) 04/01/2017 0920   APPEARANCEUR CLOUDY (A) 04/01/2017 0920   LABSPEC 1.021 04/01/2017 0920   PHURINE 5.0 04/01/2017 0920   GLUCOSEU NEGATIVE 04/01/2017 0920   HGBUR MODERATE (A) 04/01/2017 0920   BILIRUBINUR NEGATIVE 04/01/2017 0920   KETONESUR 80 (A) 04/01/2017 0920   PROTEINUR NEGATIVE 04/01/2017 0920   NITRITE NEGATIVE  04/01/2017 0920   LEUKOCYTESUR LARGE (A) 04/01/2017 0920    STUDIES: No results found.   ELIGIBLE FOR AVAILABLE RESEARCH PROTOCOL: no   ASSESSMENT: 74 y.o. [place], [place] woman status post right breast biopsy of overlapping sites on 08/19/2018 for a clinical T4 N1, stage IIIB invasive lobular carcinoma, estrogen receptor positive, progesterone receptor negative, HER-2 not amplified, with an MIB-1 of 20%.  (a) CT chest/abd/pelvis 09/02/2018 and bone scan 09/10/2018 show no evidence of stage IV disease  (b) breast MRI 09/14/2018 confirms a multicentric 12.6 cm right breast mass infiltrating pectpralis but w/o associated adenopathy  (1) genetics testing to be scheduled  (2) neoadjuvant chemotherapy consisting of cyclophosphamide, methotrexate and fluorouracil every 21 days x 8, started 09/16/2018, last dose 12/18/2018  (a) CMF chemotherapy stopped after 4 cycles, with no evidence of response by MRI NOV 2019  (5) anastrozole started February 2019  (a) bone density to be obtained  (b) palbociclib added 01/19/2019 at 125 mg daily, 21/7  (c) palbociclib dose decreased to 100 mg daily 21 on 7 off, starting with second cycle, April 2020  (d) breast MRI 05/05/2019 shows slight improvement, no evidence of progression, however the tumor is not yet resectable.  Chest CT scan same day shows no evidence of metastatic  disease  (e) anastrozole and palbociclib discontinued 08/12/2019 with no significant response  (6) foundation 1/PD-L1 testing:  (a) PD-L1 requested 12/18/2018--insufficient tissue  (b) foundation one testing shows the microsatellite status to be stable, and the mutational burden to be 16/Mb  (c) there are potentially actionable mutations in AKT3, TSC1, KRAS  MAPK1 and RB1  (7) started paclitaxel 08/18/2019, given days 1 and 8 of each 21-28-day cycle  (a) changed to every 14 days beginning with second cycle per patient preference  (b) discontinued after 10/27/2019 dose with no evidence of response (minimal evidence of progression)  (8) fulvestrant started 11/10/2019.  (a) discontinued after 02/02/2020 dose as no evidence of response  (9) definitive surgery considered, felt not to be possible given local extent of disease  (10) adjuvant radiation considered May 2021, patient opted against 08/14/2020 through 08/18/2020 Site Technique Total Dose (Gy) Dose per Fx (Gy) Completed Fx Beam Energies  Breast, Right: Breast_Rt 3D 26/26 5.2 5/5 10X, 15X   (11) started exemestane mid May 2021   (a) started everolimus 04/17/2020--discontinued after repeated diarrhea episodes  (b) exemestane and everolimus discontinued 05/24/2020 with evidence of disease progression  (12) followed by palliative care/[place] care   PLAN: [name] [name] appears to have tolerated her radiation treatments well and seems to have had a good response.  The treatment of course was palliative.  Currently she is not having pain oozing or a bad smell relating to her right breast lesions.  She seems to have a normal functional status for her.  I offered them an in person visit if they wish.  Otherwise we will do a virtual visit in February but of course I will be glad to see them at any point if there is any more urgent need.   [name] Dad. Auguste Tebbetts, [name] 10/26/20 10:28 AM Medical Oncology and Hematology [facility] [place], [place] [address] Tel. [phone]    Fax. [phone]   I, Wilburn Mylar, am acting as scribe for Dr. [name]. Merl Guardino.  I, Lurline Del [name], have reviewed the above documentation for accuracy and completeness, and I agree with the above.   *Total Encounter Time as defined by the Centers for Medicare and Medicaid Services  includes, in addition to the face-to-face time of a patient visit (documented in the note above) non-face-to-face time: obtaining and reviewing outside history, ordering and reviewing medications, tests or procedures, care coordination (communications with other health care professionals or caregivers) and documentation in the medical record.

## 2020-10-26 ENCOUNTER — Inpatient Hospital Stay: Payer: Medicare Other | Attending: Oncology | Admitting: Oncology

## 2020-10-26 DIAGNOSIS — C50811 Malignant neoplasm of overlapping sites of right female breast: Secondary | ICD-10-CM

## 2020-10-26 DIAGNOSIS — Z170 Estrogen receptor positive status [ER+]: Secondary | ICD-10-CM | POA: Diagnosis not present

## 2020-10-26 DIAGNOSIS — I6389 Other cerebral infarction: Secondary | ICD-10-CM | POA: Diagnosis not present

## 2020-10-26 DIAGNOSIS — R918 Other nonspecific abnormal finding of lung field: Secondary | ICD-10-CM | POA: Diagnosis not present

## 2020-10-26 DIAGNOSIS — C50911 Malignant neoplasm of unspecified site of right female breast: Secondary | ICD-10-CM | POA: Diagnosis not present

## 2020-10-30 ENCOUNTER — Telehealth: Payer: Self-pay | Admitting: Oncology

## 2020-10-30 NOTE — Telephone Encounter (Signed)
Scheduled appts per 12/16 los. [name] confirmed appt date and time.

## 2020-11-07 ENCOUNTER — Telehealth: Payer: Self-pay

## 2020-11-07 NOTE — Telephone Encounter (Signed)
10:10AM: Palliative care SW outreached patient/family for monthly telephonic visit.  SW left HIPPA complaint VM. Awaiting return call.  Will continue to offer palliative care support. 

## 2020-11-13 ENCOUNTER — Telehealth: Payer: Self-pay

## 2020-11-13 NOTE — Telephone Encounter (Signed)
1143 am.  Telephonic visit completed with both patient and daughter Liborio Nixon.  Patient reports doing well overall.  She is without pain.   Her appetite remains good with no weight loss noted.  Patient continues to have weakness to her right side related to a CVA.  She is not currently using any assistive devices and no falls have been reported.  Patient denies shortness of breath.  She is sleeping without issues.  Denies any shortness of breath.  Anxiety/Depression are well managed at this time.  Patient is taking remeron on a prn basis per daughter but has not required this in some time.  All other medications are being taken as ordered.  Patient denies any needs or concerns at this time.  Advised PC would reach out to patient again next month.

## 2020-11-16 MED FILL — AMLODIPINE BESYLATE 10 MG T: 10 | 90 days supply | Qty: 90 | Fill #1

## 2020-12-04 DIAGNOSIS — H401131 Primary open-angle glaucoma, bilateral, mild stage: Secondary | ICD-10-CM | POA: Diagnosis not present

## 2020-12-04 DIAGNOSIS — H2513 Age-related nuclear cataract, bilateral: Secondary | ICD-10-CM | POA: Diagnosis not present

## 2020-12-04 DIAGNOSIS — H25012 Cortical age-related cataract, left eye: Secondary | ICD-10-CM | POA: Diagnosis not present

## 2020-12-11 MED FILL — ATORVASTATIN 80 MG TABLET: 80 | 90 days supply | Qty: 90 | Fill #0

## 2020-12-13 ENCOUNTER — Other Ambulatory Visit (HOSPITAL_COMMUNITY): Payer: Self-pay | Admitting: Internal Medicine

## 2020-12-25 ENCOUNTER — Telehealth: Payer: Self-pay

## 2020-12-25 NOTE — Telephone Encounter (Signed)
Daughter called in on behalf of her mother, wanted to know if pt could get a leg massage. Pt has history of leg edema, no prior hx of DVT and not on any blood thinners. Will review with Dr. Jana Hakim.  Called daughter and left message ok per Dr Jana Hakim to have leg massage.

## 2020-12-27 NOTE — Progress Notes (Signed)
[place]  [phone] [phone]    ID: [name] [name] DOB: Nov 08, 1946  MR#: [id number]  RXV#:[id number]  Patient Care Team: [name], MD as PCP - General (Internal Medicine) Waynetta Sandy, MD as Consulting Physician (Vascular Surgery) Patsey Berthold, NP as Nurse Practitioner (Cardiology) [name], MD as Consulting Physician (Physical Medicine and Rehabilitation) [name], [name], NP (Inactive) as Nurse Practitioner (Vascular Surgery) Edgardo Roys, PsyD as Consulting Physician (Psychology) [name] [name], [name] Dad, MD as Consulting Physician (Oncology) [name], MD as Consulting Physician (General Surgery) Eppie Gibson, MD as Attending Physician (Radiation Oncology) OTHER MD:  I tried to connect with [name] [name] on 12/27/20 but there was no answer to the phone and I left a voicemail.   CHIEF COMPLAINT: Inflammatory breast cancer  CURRENT TREATMENT: observation/palliative care   INTERVAL HISTORY:    REVIEW OF SYSTEMS: [name] [name]    COVID 19 VACCINATION STATUS: [place] x2, most recently 04/2020   HISTORY OF CURRENT ILLNESS: From the original intake note:  [name] [name] presented with right breast discoloration and redness. The right breast also had an area of hardness and nipple retraction.  She did not bring this to medical attention but her daughter did mention it when the patient had neurologic follow-up on 08/10/2018.  At that time the nurse practitioner, [name], noted right nipple inversion, hard breast, and red non-ulcerated lesions on the breast.  The patient then underwent bilateral diagnostic mammography with tomography and right breast ultrasonography at [facility] on 08/11/2018 (this is her first ever mammogram) showing: breast density category B. There was a round 2.5 cm mass at the 1 o'clock upper inner quadrant of the right breast.  By palpation this measured approximately 10 cm.  Sonographically  the irregular hypoechoic mass measured 2.7 cm. There was also a hypoechoic mass in the right breast at 9 o'clock upper outer quadrant measuring 3.8 cm. There was an abnormal right axillary lymph node with cortical thickening.   Accordingly on 08/19/2018 she proceeded to biopsy of the right breast areas in question. The pathology from this procedure showed [id number]): At the 1 o'clock: invasive lobular carcinoma, grade II. Prognostic indicators significant for: estrogen receptor, 60% positive with moderate staining intensity and progesterone receptor, 0% negative. Proliferation marker Ki67 at 20%. HER2 negative with an immunohistochemistry of (1+).  At the 9 o'clock: invasive lobular carcinoma, grade II. Prognostic indicators significant for: estrogen receptor, 90% positive with strong staining intensity and progesterone receptor, 0% negative. Proliferation marker Ki67 at 30%. HER2 negative with an immunohistochemistry of (1+).  The patient's subsequent history is as detailed below.   PAST MEDICAL HISTORY: Past Medical History:  Diagnosis Date  . Anxiety   . Cancer [facility])    breast cancer- right  . Carotid stenosis   . Depression    situational  . Dyspnea    with much ambulation  . GERD (gastroesophageal reflux disease)   . Hypertension   . Stroke ([place]) 04/01/2017   a little weakness right side leg and hand, a little memry loss    PAST SURGICAL HISTORY: Past Surgical History:  Procedure Laterality Date  . ABDOMINAL HYSTERECTOMY    . ENDARTERECTOMY Left 04/08/2017   Procedure: ENDARTERECTOMY CAROTID;  Surgeon: Waynetta Sandy, MD;  Location: [place] [facility];  Service: Vascular;  Laterality: Left;  . PATCH ANGIOPLASTY Left 04/08/2017   Procedure: PATCH ANGIOPLASTY Left Carotid;  Surgeon: Waynetta Sandy, MD;  Location: [facility];  Service: Vascular;  Laterality: Left;  .  PORTACATH PLACEMENT Left 09/07/2018   Procedure: INSERTION PORT-A-CATH LEFT INTERNAL JUGULAR;  Surgeon: Toth,  Paul III, MD;  Location: [facility] OR;  Service: General;  Laterality: Left;  total hysterectomy with bilateral salpingo-oophorectomy in her 30's no hormone replacement   FAMILY HISTORY: Family History  Problem Relation Age of Onset  . Cancer Mother        ovarian  The patient's father is alive at age 89. The patient's mother died at age 56 due to ovarian cancer. The patient had 3 brothers and 3 sisters. She denies a history of breast, pancreatic, colon, or other cancers in the family.    GYNECOLOGIC HISTORY:  No LMP recorded. Patient has had a hysterectomy. Menarche: Age at first live birth: 75 years old She is GX P1.  She is status post total hysterectomy with BSO in her early 30's. She did not use HRT.    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 She is divorced. At home is just herself, but her two grandsons regularly come to visit and stay with her. The patient's daughter, [name] [name] works in administration for [employer]. The patient's 25 y.o. grandson is a manager for [employer]. The patient's 20 y.o. grandson works in fast food.    ADVANCED DIRECTIVES: Not in place.  At the 08/26/2018 visit the patient was given the appropriate documents to complete and notarized at her discretion.   HEALTH MAINTENANCE: Social History   Tobacco Use  . Smoking status: Never Smoker  . Smokeless tobacco: Never Used  Vaping Use  . Vaping Use: Never used  Substance Use Topics  . Alcohol use: No  . Drug use: No     Colonoscopy: Never  PAP: Status post hysterectomy  Bone density: Never   No Known Allergies  Current Outpatient Medications  Medication Sig Dispense Refill  . amLODipine (NORVASC) 10 MG tablet     . atorvastatin (LIPITOR) 80 MG tablet TAKE 1 TABLET BY MOUTH ONCE DAILY FOR 14 DAYS    . clopidogrel (PLAVIX) 75 MG tablet TAKE 1 TABLET BY MOUTH ONCE DAILY FOR 14 DAYS    . latanoprost (XALATAN) 0.005 % ophthalmic solution Place 1 drop into both  eyes daily.    . lisinopril-hydrochlorothiazide (ZESTORETIC) 20-12.5 MG tablet     . loratadine (CLARITIN) 10 MG tablet Take 10 mg by mouth daily as needed for allergies.    . mirtazapine (REMERON) 7.5 MG tablet Take by mouth.  (Patient not taking: Reported on 08/04/2020)    . potassium chloride SA (K-DUR,KLOR-CON) 20 MEQ tablet Take 1 tablet (20 mEq total) by mouth daily. 30 tablet 0   No current facility-administered medications for this visit.    OBJECTIVE:  There were no vitals filed for this visit. Wt Readings from Last 3 Encounters:  06/07/20 (!) 207 lb (93.9 kg)  05/24/20 207 lb 12.8 oz (94.3 kg)  04/19/20 216 lb 4.8 oz (98.1 kg)   There is no height or weight on file to calculate BMI.    ECOG FS:2 - Symptomatic, <50% confined to bed  Telemedicine visit 12/28/2020   Right breast 05/24/2020     Right breast 04/19/2020      Right breast 02/02/2020     Right breast 08/12/2019    Right Breast 04/07/2019   LAB RESULTS:  CMP     Component Value Date/Time   NA 140 05/24/2020 1522   K 3.5 05/24/2020 1522   CL 108 05/24/2020 1522   CO2   22 05/24/2020 1522   GLUCOSE 109 (H) 05/24/2020 1522   BUN 16 05/24/2020 1522   CREATININE 1.05 (H) 05/24/2020 1522   CREATININE 1.11 (H) 11/03/2018 1017   CALCIUM 9.1 05/24/2020 1522   PROT 7.5 05/24/2020 1522   ALBUMIN 3.4 (L) 05/24/2020 1522   AST 14 (L) 05/24/2020 1522   AST 32 11/03/2018 1017   ALT 13 05/24/2020 1522   ALT 48 (H) 11/03/2018 1017   ALKPHOS 160 (H) 05/24/2020 1522   BILITOT 0.3 05/24/2020 1522   BILITOT 0.5 11/03/2018 1017   GFRNONAA 53 (L) 05/24/2020 1522   GFRNONAA 50 (L) 11/03/2018 1017   GFRAA >60 05/24/2020 1522   GFRAA 58 (L) 11/03/2018 1017    No results found for: TOTALPROTELP, ALBUMINELP, A1GS, A2GS, BETS, BETA2SER, GAMS, MSPIKE, SPEI  No results found for: KPAFRELGTCHN, LAMBDASER, KAPLAMBRATIO  Lab Results  Component Value Date   WBC 7.8 05/24/2020   NEUTROABS 3.5 05/24/2020    HGB 10.5 (L) 05/24/2020   HCT 35.0 (L) 05/24/2020   MCV 75.1 (L) 05/24/2020   PLT 212 05/24/2020    No results found for: LABCA2  No components found for: LABCAN125  No results for input(s): INR in the last 168 hours.  No results found for: LABCA2  No results found for: CAN199  No results found for: CAN125  No results found for: CAN153  Lab Results  Component Value Date   CA2729 78.6 (H) 08/18/2019    No components found for: HGQUANT  No results found for: CEA1 / No results found for: CEA1   No results found for: AFPTUMOR  No results found for: CHROMOGRNA  No results found for: HGBA, HGBA2QUANT, HGBFQUANT, HGBSQUAN (Hemoglobinopathy evaluation)   No results found for: LDH  No results found for: IRON, TIBC, IRONPCTSAT (Iron and TIBC)  No results found for: FERRITIN  Urinalysis    Component Value Date/Time   COLORURINE AMBER (A) 04/01/2017 0920   APPEARANCEUR CLOUDY (A) 04/01/2017 0920   LABSPEC 1.021 04/01/2017 0920   PHURINE 5.0 04/01/2017 0920   GLUCOSEU NEGATIVE 04/01/2017 0920   HGBUR MODERATE (A) 04/01/2017 0920   BILIRUBINUR NEGATIVE 04/01/2017 0920   KETONESUR 80 (A) 04/01/2017 0920   PROTEINUR NEGATIVE 04/01/2017 0920   NITRITE NEGATIVE 04/01/2017 0920   LEUKOCYTESUR LARGE (A) 04/01/2017 0920    STUDIES: No results found.   ELIGIBLE FOR AVAILABLE RESEARCH PROTOCOL: no   ASSESSMENT: 74 y.o. [place], [place] woman status post right breast biopsy of overlapping sites on 08/19/2018 for a clinical T4 N1, stage IIIB invasive lobular carcinoma, estrogen receptor positive, progesterone receptor negative, HER-2 not amplified, with an MIB-1 of 20%.  (a) CT chest/abd/pelvis 09/02/2018 and bone scan 09/10/2018 show no evidence of stage IV disease  (b) breast MRI 09/14/2018 confirms a multicentric 12.6 cm right breast mass infiltrating pectpralis but w/o associated adenopathy  (1) genetics testing to be scheduled  (2) neoadjuvant chemotherapy consisting  of cyclophosphamide, methotrexate and fluorouracil every 21 days x 8, started 09/16/2018, last dose 12/18/2018  (a) CMF chemotherapy stopped after 4 cycles, with no evidence of response by MRI NOV 2019  (5) anastrozole started February 2019  (a) bone density to be obtained  (b) palbociclib added 01/19/2019 at 125 mg daily, 21/7  (c) palbociclib dose decreased to 100 mg daily 21 on 7 off, starting with second cycle, April 2020  (d) breast MRI 05/05/2019 shows slight improvement, no evidence of progression, however the tumor is not yet resectable.  Chest CT scan same day shows   no evidence of metastatic disease  (e) anastrozole and palbociclib discontinued 08/12/2019 with no significant response  (6) foundation 1/PD-L1 testing:  (a) PD-L1 requested 12/18/2018--insufficient tissue  (b) foundation one testing shows the microsatellite status to be stable, and the mutational burden to be 16/Mb  (c) there are potentially actionable mutations in AKT3, TSC1, KRAS  MAPK1 and RB1  (7) started paclitaxel 08/18/2019, given days 1 and 8 of each 21-28-day cycle  (a) changed to every 14 days beginning with second cycle per patient preference  (b) discontinued after 10/27/2019 dose with no evidence of response (minimal evidence of progression)  (8) fulvestrant started 11/10/2019.  (a) discontinued after 02/02/2020 dose as no evidence of response  (9) definitive surgery considered, felt not to be possible given local extent of disease  (10) adjuvant radiation considered May 2021, patient opted against 08/14/2020 through 08/18/2020 Site Technique Total Dose (Gy) Dose per Fx (Gy) Completed Fx Beam Energies  Breast, Right: Breast_Rt 3D 26/26 5.2 5/5 10X, 15X   (11) started exemestane mid May 2021   (a) started everolimus 04/17/2020--discontinued after repeated diarrhea episodes  (b) exemestane and everolimus discontinued 05/24/2020 with evidence of disease progression  (12) followed by palliative  care/[place] care   PLAN: I was unable to connect with [name] [name] the patient's daughter today (that is whose phone number we have).  I was late calling and it is possible that the waited initially but then were not able to wait longer.  I am rescheduling a virtual visit for 01/11/2021.  [name] [name]. Magrinat, MD 12/27/20 10:39 PM Medical Oncology and Hematology [facility] [place], [place] [address] Tel. [phone]    Fax. [phone]   I, Wilburn Mylar, am acting as scribe for Dr. Virgie [name]. [name] [name].  I, Lurline Del MD, have reviewed the above documentation for accuracy and completeness, and I agree with the above.   *Total Encounter Time as defined by the Centers for Medicare and Medicaid Services includes, in addition to the face-to-face time of a patient visit (documented in the note above) non-face-to-face time: obtaining and reviewing outside history, ordering and reviewing medications, tests or procedures, care coordination (communications with other health care professionals or caregivers) and documentation in the medical record.

## 2020-12-28 ENCOUNTER — Telehealth (HOSPITAL_BASED_OUTPATIENT_CLINIC_OR_DEPARTMENT_OTHER): Payer: Medicare Other | Admitting: Oncology

## 2020-12-28 DIAGNOSIS — C50811 Malignant neoplasm of overlapping sites of right female breast: Secondary | ICD-10-CM

## 2020-12-28 DIAGNOSIS — Z170 Estrogen receptor positive status [ER+]: Secondary | ICD-10-CM

## 2021-01-02 ENCOUNTER — Telehealth: Payer: Self-pay

## 2021-01-02 DIAGNOSIS — Z515 Encounter for palliative care: Secondary | ICD-10-CM

## 2021-01-02 NOTE — Telephone Encounter (Signed)
10:30AM: Palliative care SW outreached patient to complete telephonic visit.   Palliative care SW outreached patient to complete telephonic visit. Patient's daughter provided update on medical condition and/or changes. Daughter shared that patient has been doing well. No changes from last month. No recent falls. Patients appetite is the same with weight changes. No medication changes. Patient is scheduled to follow up with oncology next month. Patient denies financial, food insecurities or issues with transportation. No other psychosocial needs. Daughter appreciative of telephonic check in and would like to continue monthly telephonic calls unless otherwise needed.   Palliative care will continue to monitor and assist with long term care planning as needed.

## 2021-01-04 ENCOUNTER — Telehealth: Payer: Self-pay | Admitting: Oncology

## 2021-01-04 NOTE — Telephone Encounter (Signed)
Rescheduled appt per 2/24 sch msg - pt daughter is aware of appt date and time

## 2021-01-10 ENCOUNTER — Telehealth: Payer: Self-pay

## 2021-01-10 NOTE — Telephone Encounter (Signed)
Daughter called in to report pt has some areas on legs and feet where the skin has gotten darker. Pt has regular foot and nail care, no open areas , no bruising , no pain or blistering. Pt has foot and leg massage scheduled for this Friday. Encouraged daughter to make PCP aware at appointment next week regarding foot and leg concern.

## 2021-01-12 MED FILL — LISINOPRIL-HCTZ 20-12.5 MG: 20-12.5 | 90 days supply | Qty: 90 | Fill #2

## 2021-01-12 MED FILL — CLOPIDOGREL 75 MG TABLET: 75 | 90 days supply | Qty: 90 | Fill #2

## 2021-01-15 ENCOUNTER — Other Ambulatory Visit (HOSPITAL_COMMUNITY): Payer: Self-pay | Admitting: Optometry

## 2021-01-15 DIAGNOSIS — H401131 Primary open-angle glaucoma, bilateral, mild stage: Secondary | ICD-10-CM | POA: Diagnosis not present

## 2021-01-15 MED FILL — LATANOPROST 0.005% OPTH SOL: 0.005 | 25 days supply | Qty: 3 | Fill #0

## 2021-01-19 ENCOUNTER — Encounter: Payer: Self-pay | Admitting: Podiatry

## 2021-01-19 ENCOUNTER — Other Ambulatory Visit: Payer: Self-pay

## 2021-01-19 ENCOUNTER — Ambulatory Visit (INDEPENDENT_AMBULATORY_CARE_PROVIDER_SITE_OTHER): Payer: Medicare Other | Admitting: Podiatry

## 2021-01-19 DIAGNOSIS — B351 Tinea unguium: Secondary | ICD-10-CM

## 2021-01-19 DIAGNOSIS — D689 Coagulation defect, unspecified: Secondary | ICD-10-CM | POA: Diagnosis not present

## 2021-01-19 DIAGNOSIS — M79674 Pain in right toe(s): Secondary | ICD-10-CM

## 2021-01-19 DIAGNOSIS — M79675 Pain in left toe(s): Secondary | ICD-10-CM | POA: Diagnosis not present

## 2021-01-19 NOTE — Progress Notes (Signed)
This patient returns to my office for at risk foot care.  This patient requires this care by a professional since this patient will be at risk due to having  Coagulation defect due to plavix.  This patient is unable to cut nails herself since the patient cannot reach her nails.These nails are painful walking and wearing shoes.  This patient presents for at risk foot care today.  General Appearance  Alert, conversant and in no acute stress.  Vascular  Dorsalis pedis and posterior tibial  pulses are palpable  bilaterally.  Capillary return is within normal limits  bilaterally. Temperature is within normal limits  bilaterally.  Neurologic  Senn-Weinstein monofilament wire test within normal limits  bilaterally. Muscle power within normal limits bilaterally.  Nails Thick disfigured discolored nails with subungual debris  from hallux to fifth toes bilaterally. No evidence of bacterial infection or drainage bilaterally.  Orthopedic  No limitations of motion  feet .  No crepitus or effusions noted.  No bony pathology or digital deformities noted.  Skin  normotropic skin with no porokeratosis noted bilaterally.  No signs of infections or ulcers noted.  Patient has heel desquamation and brown discoloration spots on foot possibly from chemo.   Onychomycosis  Pain in right toes  Pain in left toes  Consent was obtained for treatment procedures.   Mechanical debridement of nails 1-5  bilaterally performed with a nail nipper.  Filed with dremel without incident.    Return office visit   3 months                   Told patient to return for periodic foot care and evaluation due to potential at risk complications.   Gardiner Barefoot DPM

## 2021-01-26 ENCOUNTER — Emergency Department (HOSPITAL_COMMUNITY)
Admission: EM | Admit: 2021-01-26 | Discharge: 2021-01-26 | Disposition: A | Discharge status: Home / Self Care | Payer: Medicare Other | Attending: Emergency Medicine | Admitting: Emergency Medicine

## 2021-01-26 ENCOUNTER — Encounter (HOSPITAL_COMMUNITY): Payer: Self-pay | Admitting: *Deleted

## 2021-01-26 ENCOUNTER — Emergency Department (HOSPITAL_COMMUNITY): Payer: Medicare Other

## 2021-01-26 DIAGNOSIS — M79672 Pain in left foot: Secondary | ICD-10-CM | POA: Diagnosis not present

## 2021-01-26 DIAGNOSIS — R600 Localized edema: Secondary | ICD-10-CM | POA: Insufficient documentation

## 2021-01-26 DIAGNOSIS — M19072 Primary osteoarthritis, left ankle and foot: Secondary | ICD-10-CM | POA: Diagnosis not present

## 2021-01-26 DIAGNOSIS — Z5321 Procedure and treatment not carried out due to patient leaving prior to being seen by health care provider: Secondary | ICD-10-CM | POA: Diagnosis not present

## 2021-01-26 DIAGNOSIS — M7989 Other specified soft tissue disorders: Secondary | ICD-10-CM | POA: Diagnosis not present

## 2021-01-26 NOTE — ED Notes (Signed)
Pt LWBS. Pts daughter wanted to take pt home.

## 2021-01-26 NOTE — ED Triage Notes (Signed)
Pt who is undergoing breastcancer treatments is here for left foot pain and swelling.  No trauma.

## 2021-01-27 NOTE — Progress Notes (Signed)
[place]  [phone] [phone]    ID: [name] [name] DOB: December 07, 1945  MR#: [id number]  GYJ#:[id number]  Patient Care Team: [name] Carol, MD as PCP - General (Internal Medicine) [name] Sandy, MD as Consulting Physician (Vascular Surgery) [name] Berthold, NP as Nurse Practitioner (Cardiology) [name] Staggers, MD as Consulting Physician (Physical Medicine and Rehabilitation) [name], [name], NP (Inactive) as Nurse Practitioner (Vascular Surgery) [name] Roys, PsyD as Consulting Physician (Psychology) Magrinat, [name], MD as Consulting Physician (Oncology) [name] Kussmaul, MD as Consulting Physician (General Surgery) [name] Gibson, MD as Attending Physician (Radiation Oncology) OTHER MD:  I was unable to connect with [name] [name] on 01/29/21 at 12:00 PM EDT by telephone visit and verified that I am speaking with the correct person using two identifiers.   I discussed the limitations, risks, security and privacy concerns of performing an evaluation and management service by telemedicine and the availability of in-person appointments. I also discussed with the patient that there may be a patient responsible charge related to this service. The patient expressed understanding and agreed to proceed.   Other persons participating in the visit and their role in the encounter:    Patient's location:   Provider's location: [place]  Total time spent:  min   CHIEF COMPLAINT: Inflammatory breast cancer  CURRENT TREATMENT: observation/palliative care   INTERVAL HISTORY: [name] [name] was contacted today for follow up of her inflammatory breast cancer. She continues under observation.  Since her last visit, she has not undergone any additional studies.   REVIEW OF SYSTEMS: [name] [name]    COVID 19 VACCINATION STATUS: [place] x2, most recently 04/2020   HISTORY OF CURRENT ILLNESS: From the original intake  note:  [name] [name] presented with right breast discoloration and redness. The right breast also had an area of hardness and nipple retraction.  She did not bring this to medical attention but her daughter did mention it when the patient had neurologic follow-up on 08/10/2018.  At that time the nurse practitioner, [name] [name], noted right nipple inversion, hard breast, and red non-ulcerated lesions on the breast.  The patient then underwent bilateral diagnostic mammography with tomography and right breast ultrasonography at [facility] on 08/11/2018 (this is her first ever mammogram) showing: breast density category B. There was a round 2.5 cm mass at the 1 o'clock upper inner quadrant of the right breast.  By palpation this measured approximately 10 cm.  Sonographically the irregular hypoechoic mass measured 2.7 cm. There was also a hypoechoic mass in the right breast at 9 o'clock upper outer quadrant measuring 3.8 cm. There was an abnormal right axillary lymph node with cortical thickening.   Accordingly on 08/19/2018 she proceeded to biopsy of the right breast areas in question. The pathology from this procedure showed [id number]): At the 1 o'clock: invasive lobular carcinoma, grade II. Prognostic indicators significant for: estrogen receptor, 60% positive with moderate staining intensity and progesterone receptor, 0% negative. Proliferation marker Ki67 at 20%. HER2 negative with an immunohistochemistry of (1+).  At the 9 o'clock: invasive lobular carcinoma, grade II. Prognostic indicators significant for: estrogen receptor, 90% positive with strong staining intensity and progesterone receptor, 0% negative. Proliferation marker Ki67 at 30%. HER2 negative with an immunohistochemistry of (1+).  The patient's subsequent history is as detailed below.   PAST MEDICAL HISTORY: Past Medical History:  Diagnosis Date  . Anxiety   . Cancer [facility])    breast cancer- right  . Carotid stenosis   .  Depression     situational  . Dyspnea    with much ambulation  . GERD (gastroesophageal reflux disease)   . Hypertension   . Stroke ([place]) 04/01/2017   a little weakness right side leg and hand, a little memry loss    PAST SURGICAL HISTORY: Past Surgical History:  Procedure Laterality Date  . ABDOMINAL HYSTERECTOMY    . ENDARTERECTOMY Left 04/08/2017   Procedure: ENDARTERECTOMY CAROTID;  Surgeon: [name] Sandy, MD;  Location: [place];  Service: Vascular;  Laterality: Left;  . PATCH ANGIOPLASTY Left 04/08/2017   Procedure: PATCH ANGIOPLASTY Left Carotid;  Surgeon: [name] Sandy, MD;  Location: [place];  Service: Vascular;  Laterality: Left;  . PORTACATH PLACEMENT Left 09/07/2018   Procedure: INSERTION PORT-A-CATH LEFT INTERNAL JUGULAR;  Surgeon: [name] Kussmaul, MD;  Location: [place];  Service: General;  Laterality: Left;  total hysterectomy with bilateral salpingo-oophorectomy in her 58's no hormone replacement   FAMILY HISTORY: Family History  Problem Relation Age of Onset  . Cancer Mother        ovarian  The patient's father is alive at age 37. The patient's mother died at age 67 due to ovarian cancer. The patient had 3 brothers and 3 sisters. She denies a history of breast, pancreatic, colon, or other cancers in the family.    GYNECOLOGIC HISTORY:  No LMP recorded. Patient has had a hysterectomy. Menarche: Age at first live birth: 75 years old She is GX P1.  She is status post total hysterectomy with BSO in her early 66's. She did not use HRT.    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. She is divorced. At home is just herself, but her two grandsons regularly come to visit and stay with her. The patient's daughter, [name] [name] works in administration for [employer]. The patient's 63 y.o. grandson is a Freight forwarder for [employer]. The patient's 36 y.o. grandson works in [employer].    ADVANCED DIRECTIVES: Not in place.  At the 08/26/2018 visit  the patient was given the appropriate documents to complete and notarized at her discretion.   HEALTH MAINTENANCE: Social History   Tobacco Use  . Smoking status: Never Smoker  . Smokeless tobacco: Never Used  Vaping Use  . Vaping Use: Never used  Substance Use Topics  . Alcohol use: No  . Drug use: No     Colonoscopy: Never  PAP: Status post hysterectomy  Bone density: Never   No Known Allergies  Current Outpatient Medications  Medication Sig Dispense Refill  . amLODipine (NORVASC) 10 MG tablet     . atorvastatin (LIPITOR) 80 MG tablet TAKE 1 TABLET BY MOUTH ONCE DAILY FOR 14 DAYS    . clopidogrel (PLAVIX) 75 MG tablet TAKE 1 TABLET BY MOUTH ONCE DAILY FOR 14 DAYS    . latanoprost (XALATAN) 0.005 % ophthalmic solution Place 1 drop into both eyes daily.    Marland Kitchen lisinopril-hydrochlorothiazide (ZESTORETIC) 20-12.5 MG tablet     . loratadine (CLARITIN) 10 MG tablet Take 10 mg by mouth daily as needed for allergies.    . mirtazapine (REMERON) 7.5 MG tablet Take by mouth.  (Patient not taking: Reported on 08/04/2020)    . potassium chloride SA (K-DUR,KLOR-CON) 20 MEQ tablet Take 1 tablet (20 mEq total) by mouth daily. 30 tablet 0   No current facility-administered medications for this visit.    OBJECTIVE:  There were no vitals filed for this visit. Wt Readings from Last  3 Encounters:  06/07/20 (!) 207 lb (93.9 kg)  05/24/20 207 lb 12.8 oz (94.3 kg)  04/19/20 216 lb 4.8 oz (98.1 kg)   There is no height or weight on file to calculate BMI.    ECOG FS:2 - Symptomatic, <50% confined to bed  Telemedicine visit 01/29/2021   Right breast 05/24/2020     Right breast 04/19/2020      Right breast 02/02/2020     Right breast 08/12/2019    Right Breast 04/07/2019   LAB RESULTS:  CMP     Component Value Date/Time   NA 140 05/24/2020 1522   K 3.5 05/24/2020 1522   CL 108 05/24/2020 1522   CO2 22 05/24/2020 1522   GLUCOSE 109 (H) 05/24/2020 1522   BUN 16  05/24/2020 1522   CREATININE 1.05 (H) 05/24/2020 1522   CREATININE 1.11 (H) 11/03/2018 1017   CALCIUM 9.1 05/24/2020 1522   PROT 7.5 05/24/2020 1522   ALBUMIN 3.4 (L) 05/24/2020 1522   AST 14 (L) 05/24/2020 1522   AST 32 11/03/2018 1017   ALT 13 05/24/2020 1522   ALT 48 (H) 11/03/2018 1017   ALKPHOS 160 (H) 05/24/2020 1522   BILITOT 0.3 05/24/2020 1522   BILITOT 0.5 11/03/2018 1017   GFRNONAA 53 (L) 05/24/2020 1522   GFRNONAA 50 (L) 11/03/2018 1017   GFRAA >60 05/24/2020 1522   GFRAA 58 (L) 11/03/2018 1017    No results found for: TOTALPROTELP, ALBUMINELP, A1GS, A2GS, BETS, BETA2SER, GAMS, MSPIKE, SPEI  No results found for: KPAFRELGTCHN, LAMBDASER, KAPLAMBRATIO  Lab Results  Component Value Date   WBC 7.8 05/24/2020   NEUTROABS 3.5 05/24/2020   HGB 10.5 (L) 05/24/2020   HCT 35.0 (L) 05/24/2020   MCV 75.1 (L) 05/24/2020   PLT 212 05/24/2020    No results found for: LABCA2  No components found for: QIONGE952  No results for input(s): INR in the last 168 hours.  No results found for: LABCA2  No results found for: WUX324  No results found for: MWN027  No results found for: OZD664  Lab Results  Component Value Date   CA2729 78.6 (H) 08/18/2019    No components found for: HGQUANT  No results found for: CEA1 / No results found for: CEA1   No results found for: AFPTUMOR  No results found for: CHROMOGRNA  No results found for: HGBA, HGBA2QUANT, HGBFQUANT, HGBSQUAN (Hemoglobinopathy evaluation)   No results found for: LDH  No results found for: IRON, TIBC, IRONPCTSAT (Iron and TIBC)  No results found for: FERRITIN  Urinalysis    Component Value Date/Time   COLORURINE AMBER (A) 04/01/2017 0920   APPEARANCEUR CLOUDY (A) 04/01/2017 0920   LABSPEC 1.021 04/01/2017 0920   PHURINE 5.0 04/01/2017 0920   GLUCOSEU NEGATIVE 04/01/2017 0920   HGBUR MODERATE (A) 04/01/2017 0920   BILIRUBINUR NEGATIVE 04/01/2017 0920   KETONESUR 80 (A) 04/01/2017 0920    PROTEINUR NEGATIVE 04/01/2017 0920   NITRITE NEGATIVE 04/01/2017 0920   LEUKOCYTESUR LARGE (A) 04/01/2017 0920    STUDIES: DG Foot Complete Left  Result Date: 01/26/2021 CLINICAL DATA:  Foot pain, swelling EXAM: LEFT FOOT - COMPLETE 3+ VIEW COMPARISON:  None. FINDINGS: Advanced degenerative changes in the 1st MTP joint with joint space loss and spurring. No acute bony abnormality. Specifically, no fracture, subluxation, or dislocation. Marked soft tissue swelling along the dorsum of the foot. IMPRESSION: Dorsal soft tissue swelling. No acute bony abnormality. Advanced degenerative changes at the 1st MTP joint. Electronically Signed  By: Rolm Baptise M.D.   On: 01/26/2021 16:24     ELIGIBLE FOR AVAILABLE RESEARCH PROTOCOL: no   ASSESSMENT: 75 y.o. [place], [place] woman status post right breast biopsy of overlapping sites on 08/19/2018 for a clinical T4 N1, stage IIIB invasive lobular carcinoma, estrogen receptor positive, progesterone receptor negative, HER-2 not amplified, with an MIB-1 of 20%.  (a) CT chest/abd/pelvis 09/02/2018 and bone scan 09/10/2018 show no evidence of stage IV disease  (b) breast MRI 09/14/2018 confirms a multicentric 12.6 cm right breast mass infiltrating pectpralis but w/o associated adenopathy  (1) genetics testing to be scheduled  (2) neoadjuvant chemotherapy consisting of cyclophosphamide, methotrexate and fluorouracil every 21 days x 8, started 09/16/2018, last dose 12/18/2018  (a) CMF chemotherapy stopped after 4 cycles, with no evidence of response by MRI NOV 2019  (5) anastrozole started February 2019  (a) bone density to be obtained  (b) palbociclib added 01/19/2019 at 125 mg daily, 21/7  (c) palbociclib dose decreased to 100 mg daily 21 on 7 off, starting with second cycle, April 2020  (d) breast MRI 05/05/2019 shows slight improvement, no evidence of progression, however the tumor is not yet resectable.  Chest CT scan same day shows no evidence of  metastatic disease  (e) anastrozole and palbociclib discontinued 08/12/2019 with no significant response  (6) foundation 1/PD-L1 testing:  (a) PD-L1 requested 12/18/2018--insufficient tissue  (b) foundation one testing shows the microsatellite status to be stable, and the mutational burden to be 16/Mb  (c) there are potentially actionable mutations in AKT3, TSC1, KRAS  MAPK1 and RB1  (7) started paclitaxel 08/18/2019, given days 1 and 8 of each 21-28-day cycle  (a) changed to every 14 days beginning with second cycle per patient preference  (b) discontinued after 10/27/2019 dose with no evidence of response (minimal evidence of progression)  (8) fulvestrant started 11/10/2019.  (a) discontinued after 02/02/2020 dose as no evidence of response  (9) definitive surgery considered, felt not to be possible given local extent of disease  (10) adjuvant radiation considered May 2021, patient opted against 08/14/2020 through 08/18/2020 Site Technique Total Dose (Gy) Dose per Fx (Gy) Completed Fx Beam Energies  Breast, Right: Breast_Rt 3D 26/26 5.2 5/5 10X, 15X   (11) started exemestane mid May 2021   (a) started everolimus 04/17/2020--discontinued after repeated diarrhea episodes  (b) exemestane and everolimus discontinued 05/24/2020 with evidence of disease progression  (12) followed by palliative care/[place] care   PLAN: I was unable to connect with the patient's daughter today.  I did leave her a voicemail with my beeper number to call back.  I also wrote a letter at the daughter's address giving her information on how to contact us directly   [place] C. Magrinat, MD 01/29/21 1:18 PM Medical Oncology and Hematology [facility] [place], [place] [address] Tel. [phone]    Fax. [phone]   I, Wilburn Mylar, am acting as scribe for Dr. Virgie [name]. Magrinat.  I, Lurline Del MD, have reviewed the above documentation for accuracy and completeness,  and I agree with the above.   *Total Encounter Time as defined by the Centers for Medicare and Medicaid Services includes, in addition to the face-to-face time of a patient visit (documented in the note above) non-face-to-face time: obtaining and reviewing outside history, ordering and reviewing medications, tests or procedures, care coordination (communications with other health care professionals or caregivers) and documentation in the medical record.

## 2021-01-29 ENCOUNTER — Encounter: Payer: Self-pay | Admitting: Oncology

## 2021-01-29 ENCOUNTER — Telehealth: Payer: Self-pay | Admitting: Medical

## 2021-01-29 ENCOUNTER — Inpatient Hospital Stay: Payer: Medicare Other | Attending: Oncology | Admitting: Oncology

## 2021-01-29 DIAGNOSIS — C50811 Malignant neoplasm of overlapping sites of right female breast: Secondary | ICD-10-CM

## 2021-01-29 DIAGNOSIS — Z170 Estrogen receptor positive status [ER+]: Secondary | ICD-10-CM | POA: Insufficient documentation

## 2021-01-29 DIAGNOSIS — L539 Erythematous condition, unspecified: Secondary | ICD-10-CM | POA: Insufficient documentation

## 2021-01-29 DIAGNOSIS — M79672 Pain in left foot: Secondary | ICD-10-CM | POA: Insufficient documentation

## 2021-01-29 DIAGNOSIS — C50911 Malignant neoplasm of unspecified site of right female breast: Secondary | ICD-10-CM | POA: Insufficient documentation

## 2021-01-29 NOTE — Telephone Encounter (Signed)
Scheduled appts per sch msg. Patient already aware

## 2021-01-30 ENCOUNTER — Other Ambulatory Visit: Payer: Self-pay

## 2021-01-30 ENCOUNTER — Other Ambulatory Visit: Payer: Self-pay | Admitting: Medical

## 2021-01-30 ENCOUNTER — Inpatient Hospital Stay (HOSPITAL_BASED_OUTPATIENT_CLINIC_OR_DEPARTMENT_OTHER): Payer: Medicare Other | Admitting: Medical

## 2021-01-30 ENCOUNTER — Inpatient Hospital Stay: Payer: Medicare Other

## 2021-01-30 VITALS — BP 125/53 | HR 93 | Temp 97.7°F | Resp 16 | Ht 66.0 in | Wt 208.3 lb

## 2021-01-30 DIAGNOSIS — I63132 Cerebral infarction due to embolism of left carotid artery: Secondary | ICD-10-CM | POA: Diagnosis not present

## 2021-01-30 DIAGNOSIS — M79672 Pain in left foot: Secondary | ICD-10-CM | POA: Diagnosis not present

## 2021-01-30 DIAGNOSIS — C50911 Malignant neoplasm of unspecified site of right female breast: Secondary | ICD-10-CM

## 2021-01-30 DIAGNOSIS — C50811 Malignant neoplasm of overlapping sites of right female breast: Secondary | ICD-10-CM

## 2021-01-30 DIAGNOSIS — L539 Erythematous condition, unspecified: Secondary | ICD-10-CM | POA: Diagnosis not present

## 2021-01-30 DIAGNOSIS — Z170 Estrogen receptor positive status [ER+]: Secondary | ICD-10-CM

## 2021-01-30 DIAGNOSIS — I69359 Hemiplegia and hemiparesis following cerebral infarction affecting unspecified side: Secondary | ICD-10-CM

## 2021-01-30 LAB — CBC WITH DIFFERENTIAL/PLATELET
Abs Immature Granulocytes: 0.03 10*3/uL (ref 0.00–0.07)
Basophils Absolute: 0 10*3/uL (ref 0.0–0.1)
Basophils Relative: 1 %
Eosinophils Absolute: 0.2 10*3/uL (ref 0.0–0.5)
Eosinophils Relative: 3 %
HCT: 36.9 % (ref 36.0–46.0)
Hemoglobin: 11.3 g/dL — ABNORMAL LOW (ref 12.0–15.0)
Immature Granulocytes: 1 %
Lymphocytes Relative: 32 %
Lymphs Abs: 1.9 10*3/uL (ref 0.7–4.0)
MCH: 23.7 pg — ABNORMAL LOW (ref 26.0–34.0)
MCHC: 30.6 g/dL (ref 30.0–36.0)
MCV: 77.4 fL — ABNORMAL LOW (ref 80.0–100.0)
Monocytes Absolute: 0.7 10*3/uL (ref 0.1–1.0)
Monocytes Relative: 11 %
Neutro Abs: 3.2 10*3/uL (ref 1.7–7.7)
Neutrophils Relative %: 52 %
Platelets: 194 10*3/uL (ref 150–400)
RBC: 4.77 MIL/uL (ref 3.87–5.11)
RDW: 17.4 % — ABNORMAL HIGH (ref 11.5–15.5)
WBC: 6 10*3/uL (ref 4.0–10.5)
nRBC: 0 % (ref 0.0–0.2)

## 2021-01-30 LAB — COMPREHENSIVE METABOLIC PANEL
ALT: 16 U/L (ref 0–44)
AST: 14 U/L — ABNORMAL LOW (ref 15–41)
Albumin: 3.8 g/dL (ref 3.5–5.0)
Alkaline Phosphatase: 121 U/L (ref 38–126)
Anion gap: 10 (ref 5–15)
BUN: 18 mg/dL (ref 8–23)
CO2: 26 mmol/L (ref 22–32)
Calcium: 9.5 mg/dL (ref 8.9–10.3)
Chloride: 106 mmol/L (ref 98–111)
Creatinine, Ser: 1 mg/dL (ref 0.44–1.00)
GFR, Estimated: 60 mL/min — ABNORMAL LOW (ref >60)
Glucose, Bld: 102 mg/dL — ABNORMAL HIGH (ref 70–99)
Potassium: 4 mmol/L (ref 3.5–5.1)
Sodium: 142 mmol/L (ref 135–145)
Total Bilirubin: 0.4 mg/dL (ref 0.3–1.2)
Total Protein: 7.8 g/dL (ref 6.5–8.1)

## 2021-01-30 MED ORDER — AMOXICILLIN-POT CLAVULANATE 875-125 MG PO TABS: 1.0000 | ORAL_TABLET | Freq: Two times a day (BID) | ORAL | 0 refills | Status: DC

## 2021-01-30 MED FILL — AMOX TR-K CLV 875-125 MG TA: 875-125 | 7 days supply | Qty: 14 | Fill #0

## 2021-01-30 NOTE — Progress Notes (Signed)
Sympt[facility] Management Clinic Progress Note   [name] [name] [id number] 05/07/1946 75 y.o.  [name] [name] is managed by Dr. Jana Hakim  Actively treated with chemotherapy/immunotherapy/hormonal therapy: Observation and palliative care  Next scheduled appointment with provider: Virtual visit in 3 months  Assessment: Plan:    Inflammatory breast cancer, right ([place])  Pain of left heel   Inflammatory right breast cancer: [name] [name] continues to be followed conservatively by Dr. Jana Hakim and is currently managed with observation and palliative care.  He will see her via a virtual visit in 3 months.  Pain, erythema, peeling of the skin and increased warmth of the left heel.  The patient was placed on Augmentin 875-125 p.o. twice daily x7 days.  She was told to either begin a probiotic or eat yogurt while she is taking this antibiotic.  Please see After Visit Summary for patient specific instructions.  Future Appointments  Date Time Provider [place]  04/20/2021  9:00 AM Gardiner Barefoot, DPM [facility]    No orders of the defined types were placed in this encounter.      Subjective:   Patient ID:  [name] [name] is a 75 y.o. (DOB 04/19/1946) female.  Chief Complaint: No chief complaint on file.   HPI [name] [name]  is a 75 y.o. female with a diagnosis of an inflammatory right breast cancer. [name] continues to be followed conservatively by Dr. Jana Hakim and is currently managed with observation and palliative care.  She presents to the clinic today with her daughter.  She reports that the patient has had left heel tenderness, peeling of her skin, erythema and increased warmth.  She was seen in the emergency room at [facility] recently but left after waiting 2 hours.  An x-ray of her left foot was completed with results returning as noted below.  FINDINGS: Advanced degenerative changes in the 1st MTP joint with joint space loss and spurring. No acute bony  abnormality. Specifically, no fracture, subluxation, or dislocation. Marked soft tissue swelling along the dorsum of the foot.  IMPRESSION: Dorsal soft tissue swelling.  No acute bony abnormality.  Advanced degenerative changes at the 1st MTP joint.  Regarding her inflammatory right breast cancer, the patient's daughter believes that she may have a few new areas of fleshy nodularity and her right lateral breast.  Medications:   Allergies: No Known Allergies  Past Medical History:  Diagnosis Date  . Anxiety   . Cancer [facility])    breast cancer- right  . Carotid stenosis   . Depression    situational  . Dyspnea    with much ambulation  . GERD (gastroesophageal reflux disease)   . Hypertension   . Stroke ([place]) 04/01/2017   a little weakness right side leg and hand, a little memry loss    Past Surgical History:  Procedure Laterality Date  . ABDOMINAL HYSTERECTOMY    . ENDARTERECTOMY Left 04/08/2017   Procedure: ENDARTERECTOMY CAROTID;  Surgeon: Waynetta Sandy, MD;  Location: [place];  Service: Vascular;  Laterality: Left;  . PATCH ANGIOPLASTY Left 04/08/2017   Procedure: PATCH ANGIOPLASTY Left Carotid;  Surgeon: Waynetta Sandy, MD;  Location: [place];  Service: Vascular;  Laterality: Left;  . PORTACATH PLACEMENT Left 09/07/2018   Procedure: INSERTION PORT-A-CATH LEFT INTERNAL JUGULAR;  Surgeon: [name], MD;  Location: [facility] OR;  Service: General;  Laterality: Left;    Family History  Problem Relation Age of Onset  . Cancer Mother  ovarian    Social History   Socioeconomic History  . Marital status: Divorced    Spouse name: Not on file  . Number of children: Not on file  . Years of education: Not on file  . Highest education level: Not on file  Occupational History  . Not on file  Tobacco Use  . Smoking status: Never Smoker  . Smokeless tobacco: Never Used  Vaping Use  . Vaping Use: Never used  Substance and Sexual Activity  .  Alcohol use: No  . Drug use: No  . Sexual activity: Not Currently  Other Topics Concern  . Not on file  Social History Narrative  . Not on file   Social Determinants of Health   Financial Resource Strain: Not on file  Food Insecurity: Not on file  Transportation Needs: Not on file  Physical Activity: Not on file  Stress: Not on file  Social Connections: Not on file  Intimate Partner Violence: Not on file    Past Medical History, Surgical history, Social history, and Family history were reviewed and updated as appropriate.   Please see review of systems for further details on the patient's review from today.   Review of Systems:  Review of Systems  Constitutional: Negative for chills, diaphoresis and fever.  HENT: Negative for trouble swallowing and voice change.   Respiratory: Negative for cough, chest tightness, shortness of breath and wheezing.   Cardiovascular: Negative for chest pain and palpitations.  Gastrointestinal: Negative for abdominal pain, constipation, diarrhea, nausea and vomiting.  Musculoskeletal: Negative for back pain and myalgias.       Erythema, tenderness, peeling of the skin and increased warmth of the left heel.  Hyperpigmentation, scaling, and nodularity of the right breast.  Skin: Positive for color change.  Neurological: Negative for dizziness, light-headedness and headaches.    Objective:   Physical Exam:  BP (!) 125/53 (BP Location: Left Arm, Patient Position: Sitting) Comment: nurse notified  Pulse 93   Temp 97.7 F (36.5 C) (Tympanic)   Resp 16   Ht 5\' 6"  (1.676 m)   Wt 208 lb 4.8 oz (94.5 kg)   SpO2 100%   BMI 33.62 kg/m  ECOG: 1  Physical Exam Constitutional:      General: She is not in acute distress.    Appearance: She is not ill-appearing, toxic-appearing or diaphoretic.  HENT:     Head: Normocephalic and atraumatic.  Eyes:     General: No scleral icterus.       Right eye: No discharge.        Left eye: No discharge.      Conjunctiva/sclera: Conjunctivae normal.  Cardiovascular:     Rate and Rhythm: Normal rate and regular rhythm.     Heart sounds: No murmur heard. No friction rub. No gallop.   Pulmonary:     Effort: Pulmonary effort is normal. No respiratory distress.     Breath sounds: Normal breath sounds. No wheezing, rhonchi or rales.  Musculoskeletal:     Right lower leg: Edema (2+) present.     Left lower leg: Edema (2+) present.  Skin:    General: Skin is warm and dry.     Findings: Erythema present.     Comments: There is scaling of the skin over the dorsal surface of the left foot with a large area of peeling, erythema, and increased warmth over the left heel.  Neurological:     Coordination: Coordination normal.     Gait: Gait abnormal (  The patient is ambulating with the use of a wheelchair.).  Psychiatric:        Mood and Affect: Mood normal.        Behavior: Behavior normal.        Thought Content: Thought content normal.        Judgment: Judgment normal.    Breasts:  The right breast is hyperpigmented consistent with a radiation field with scaling noted.  There is a large dominant fleshy nodule in the left mid breast with several other scattered smaller indurated nodules noted.  The nipple is retracted with significant scaling and hyperpigmentation.  No discharge is appreciated.         Lab Review:     Component Value Date/Time   NA 142 01/30/2021 1300   K 4.0 01/30/2021 1300   CL 106 01/30/2021 1300   CO2 26 01/30/2021 1300   GLUCOSE 102 (H) 01/30/2021 1300   BUN 18 01/30/2021 1300   CREATININE 1.00 01/30/2021 1300   CREATININE 1.11 (H) 11/03/2018 1017   CALCIUM 9.5 01/30/2021 1300   PROT 7.8 01/30/2021 1300   ALBUMIN 3.8 01/30/2021 1300   AST 14 (L) 01/30/2021 1300   AST 32 11/03/2018 1017   ALT 16 01/30/2021 1300   ALT 48 (H) 11/03/2018 1017   ALKPHOS 121 01/30/2021 1300   BILITOT 0.4 01/30/2021 1300   BILITOT 0.5 11/03/2018 1017   GFRNONAA 60 (L) 01/30/2021  1300   GFRNONAA 50 (L) 11/03/2018 1017   GFRAA >60 05/24/2020 1522   GFRAA 58 (L) 11/03/2018 1017       Component Value Date/Time   WBC 6.0 01/30/2021 1300   RBC 4.77 01/30/2021 1300   HGB 11.3 (L) 01/30/2021 1300   HGB 10.8 (L) 11/03/2018 1017   HCT 36.9 01/30/2021 1300   PLT 194 01/30/2021 1300   PLT 150 11/03/2018 1017   MCV 77.4 (L) 01/30/2021 1300   MCH 23.7 (L) 01/30/2021 1300   MCHC 30.6 01/30/2021 1300   RDW 17.4 (H) 01/30/2021 1300   LYMPHSABS 1.9 01/30/2021 1300   MONOABS 0.7 01/30/2021 1300   EOSABS 0.2 01/30/2021 1300   BASOSABS 0.0 01/30/2021 1300   -------------------------------  Imaging from last 24 hours (if applicable):  Radiology interpretation: DG Foot Complete Left  Result Date: 01/26/2021 CLINICAL DATA:  Foot pain, swelling EXAM: LEFT FOOT - COMPLETE 3+ VIEW COMPARISON:  None. FINDINGS: Advanced degenerative changes in the 1st MTP joint with joint space loss and spurring. No acute bony abnormality. Specifically, no fracture, subluxation, or dislocation. Marked soft tissue swelling along the dorsum of the foot. IMPRESSION: Dorsal soft tissue swelling. No acute bony abnormality. Advanced degenerative changes at the 1st MTP joint. Electronically Signed   By: Rolm Baptise M.D.   On: 01/26/2021 16:24        This patient was seen with Dr. Jana Hakim with my treatment plan reviewed with him. He expressed agreement with my medical management of this patient.   ADDENDUM: Lauraann actually looks improved compared to the last time I saw her.  She has gained some weight.  She continues to be her whimsical self.  She has currently no pain or other symptoms related to her cancer.  The radiation does appear to have set the cancer back some.  As noted in the picture there are some areas that still appear viable.  We will have to follow those and I will schedule her for a virtual visit in approximately 3 months.  I personally saw this patient  and performed a substantive  portion of this encounter with the listed APP documented above.   Chauncey Cruel, MD Medical Oncology and Hematology [facility] [address] [place], [place] [address] Tel. [phone]    Fax. [phone]

## 2021-01-31 ENCOUNTER — Telehealth: Payer: Self-pay | Admitting: Oncology

## 2021-01-31 NOTE — Telephone Encounter (Signed)
Scheduled per  sch msg. Called and spoke with patients daughter. Confirmed appt  

## 2021-02-26 DIAGNOSIS — H401131 Primary open-angle glaucoma, bilateral, mild stage: Secondary | ICD-10-CM | POA: Diagnosis not present

## 2021-02-27 ENCOUNTER — Telehealth: Payer: Self-pay

## 2021-02-27 DIAGNOSIS — I10 Essential (primary) hypertension: Secondary | ICD-10-CM | POA: Diagnosis not present

## 2021-02-27 DIAGNOSIS — Z8673 Personal history of transient ischemic attack (TIA), and cerebral infarction without residual deficits: Secondary | ICD-10-CM | POA: Diagnosis not present

## 2021-02-27 DIAGNOSIS — R7989 Other specified abnormal findings of blood chemistry: Secondary | ICD-10-CM | POA: Diagnosis not present

## 2021-02-27 DIAGNOSIS — R7309 Other abnormal glucose: Secondary | ICD-10-CM | POA: Diagnosis not present

## 2021-02-27 DIAGNOSIS — Z79899 Other long term (current) drug therapy: Secondary | ICD-10-CM | POA: Diagnosis not present

## 2021-02-27 DIAGNOSIS — Z853 Personal history of malignant neoplasm of breast: Secondary | ICD-10-CM | POA: Diagnosis not present

## 2021-02-27 DIAGNOSIS — Z8679 Personal history of other diseases of the circulatory system: Secondary | ICD-10-CM | POA: Diagnosis not present

## 2021-02-27 NOTE — Telephone Encounter (Signed)
11:50AM: Palliative care SW outreached patient/family for monthly telephonic visit.  Call unsuccessful. SW left HIPPA compliant VM.  Awaiting return call. Will outreach again at later date and continue to offer palliative care support.

## 2021-03-07 ENCOUNTER — Other Ambulatory Visit (HOSPITAL_COMMUNITY): Payer: Self-pay

## 2021-03-07 MED FILL — Amlodipine Besylate Tab 10 MG (Base Equivalent): ORAL | 90 days supply | Qty: 90 | Fill #0 | Status: AC

## 2021-03-26 ENCOUNTER — Other Ambulatory Visit (HOSPITAL_COMMUNITY): Payer: Self-pay

## 2021-03-26 ENCOUNTER — Other Ambulatory Visit (HOSPITAL_BASED_OUTPATIENT_CLINIC_OR_DEPARTMENT_OTHER): Payer: Self-pay

## 2021-03-26 MED FILL — Atorvastatin Calcium Tab 80 MG (Base Equivalent): ORAL | 90 days supply | Qty: 90 | Fill #0 | Status: AC

## 2021-03-28 ENCOUNTER — Other Ambulatory Visit: Payer: Medicare Other

## 2021-03-28 ENCOUNTER — Other Ambulatory Visit: Payer: Self-pay

## 2021-03-28 DIAGNOSIS — Z515 Encounter for palliative care: Secondary | ICD-10-CM

## 2021-03-28 NOTE — Progress Notes (Signed)
PATIENT NAME: [name] [name] DOB: [birth year] MRN: [id number]  PRIMARY CARE PROVIDER: [name], MD  RESPONSIBLE PARTY:  Acct ID - Guarantor Home Phone Work Phone Relationship Acct Type  [id number] - Adderley,CYNTH* [phone]  Self P/F     [place], [place], [place] [address]   TELEHEALTH VISIT STATEMENT Due to the COVID-19 crisis, this visit was done via telemedicine from my office and it was initiated and consent by this patient and or family.  PLAN OF CARE and INTERVENTIONS:               1.  GOALS OF CARE/ ADVANCE CARE PLANNING:  Possible transition to hospice care after oncology appointment in June.                2.  PATIENT/CAREGIVER EDUCATION:  Hospice Services.               4. PERSONAL EMERGENCY PLAN:  Activate 911 for emergencies.               5.  DISEASE STATUS:  Phone call made to daughter [name] to follow up on patient's overall condition.  [name] states patient has been "doing pretty good".   Patient does not have any issues with pain at present.  Daughter notes an ED visit in March due to foot pain where skin is peeling from the bottom of foot. Daughter stated this was a result of previous chemo treatments.  Patient was diagnosed with arthritis on that visit.  She completed a follow up visit with her PCP and was started on antibiotics to address erythema, warmth and swelling to her foot.  No further issues are noted at this time by daughter.    Daughter reports patient's baseline is ongoing tiredness and slowness.  She notes sadness/depression with patient and attempted to start counseling however patient declined services and medication treatment.  Sadness was noted after CVA and CA diagnosis occurred.  Remeron is currently on patient's medication profile but patient is not currently taking this.  Daughter notes patient's normal sleep pattern is 4-6 1/2 hours per night.    Patient is ambulating without assistive devices.  It has been recommended that patient utilize a walker and  patient has one in the home.  No falls are reported.  Daughter is asking if Palliative Care offers a home aide to assist patient with dressing and bathing.  I advised aide services are not provided under Palliative Care but Hospice.  Discussed criteria for hospice admission and services provided under this program.  Daughter states patient will be following up with oncology next month and she may likely need hospice services after that visit.  Advised if further assistance is needed with a hospice referral prior to this appointment to contact Palliative Care.  Daughter is agreeable for a phone call next month after oncology appointment and will provide an update on services needed.     HISTORY OF PRESENT ILLNESS:  75 year old female with Malignant Neoplasm of the breast.  Patient is being followed by Palliative Care monthly and PRN.  CODE STATUS: Full ADVANCED DIRECTIVES: No MOST FORM: No      Lorenza [name], RN

## 2021-04-03 ENCOUNTER — Other Ambulatory Visit (HOSPITAL_COMMUNITY): Payer: Self-pay

## 2021-04-20 ENCOUNTER — Other Ambulatory Visit (HOSPITAL_BASED_OUTPATIENT_CLINIC_OR_DEPARTMENT_OTHER): Payer: Self-pay

## 2021-04-20 ENCOUNTER — Ambulatory Visit (INDEPENDENT_AMBULATORY_CARE_PROVIDER_SITE_OTHER): Payer: Medicare Other | Admitting: Podiatry

## 2021-04-20 ENCOUNTER — Telehealth: Payer: Self-pay

## 2021-04-20 ENCOUNTER — Encounter: Payer: Self-pay | Admitting: Oncology

## 2021-04-20 ENCOUNTER — Encounter: Payer: Self-pay | Admitting: Podiatry

## 2021-04-20 ENCOUNTER — Other Ambulatory Visit: Payer: Self-pay

## 2021-04-20 ENCOUNTER — Telehealth: Payer: Self-pay | Admitting: Adult Health

## 2021-04-20 ENCOUNTER — Other Ambulatory Visit (HOSPITAL_COMMUNITY): Payer: Self-pay

## 2021-04-20 DIAGNOSIS — B351 Tinea unguium: Secondary | ICD-10-CM

## 2021-04-20 DIAGNOSIS — Z170 Estrogen receptor positive status [ER+]: Secondary | ICD-10-CM

## 2021-04-20 DIAGNOSIS — M79674 Pain in right toe(s): Secondary | ICD-10-CM | POA: Diagnosis not present

## 2021-04-20 DIAGNOSIS — M79675 Pain in left toe(s): Secondary | ICD-10-CM

## 2021-04-20 DIAGNOSIS — D689 Coagulation defect, unspecified: Secondary | ICD-10-CM

## 2021-04-20 MED ORDER — LISINOPRIL-HYDROCHLOROTHIAZIDE 20-12.5 MG PO TABS
ORAL_TABLET | ORAL | 3 refills | Status: DC
  Filled 2021-04-20 (×2): qty 90, 90d supply, fill #0

## 2021-04-20 MED ORDER — PREGABALIN 25 MG PO CAPS
ORAL_CAPSULE | ORAL | 0 refills | Status: DC
  Filled 2021-04-20 – 2021-04-23 (×2): qty 60, 30d supply, fill #0

## 2021-04-20 NOTE — Progress Notes (Signed)
This patient returns to my office for at risk foot care.  This patient requires this care by a professional since this patient will be at risk due to having  Coagulation defect due to plavix.  This patient is unable to cut nails herself since the patient cannot reach her nails.These nails are painful walking and wearing shoes.  This patient presents for at risk foot care today.  General Appearance  Alert, conversant and in no acute stress.  Vascular  Dorsalis pedis and posterior tibial  pulses are palpable  bilaterally.  Capillary return is within normal limits  bilaterally. Temperature is within normal limits  bilaterally.  Neurologic  Senn-Weinstein monofilament wire test within normal limits  bilaterally. Muscle power within normal limits bilaterally.  Nails Thick disfigured discolored nails with subungual debris  from hallux to fifth toes bilaterally. No evidence of bacterial infection or drainage bilaterally.  Orthopedic  No limitations of motion  feet .  No crepitus or effusions noted.  No bony pathology or digital deformities noted.  Skin  normotropic skin with no porokeratosis noted bilaterally.  No signs of infections or ulcers noted.    Onychomycosis  Pain in right toes  Pain in left toes  Consent was obtained for treatment procedures.   Mechanical debridement of nails 1-5  bilaterally performed with a nail nipper.  Filed with dremel without incident.    Return office visit   3 months                   Told patient to return for periodic foot care and evaluation due to potential at risk complications.   Gardiner Barefoot DPM

## 2021-04-20 NOTE — Telephone Encounter (Signed)
Sch per 6/10 sch msg, pt aware

## 2021-04-20 NOTE — Telephone Encounter (Signed)
[name] called. Pt had inflammatory BRCA and had a place on her R breast that "fell off" after radiation. Daughter called stating it is back and seeping. Consulted with Wilber Bihari, NP who agreed to see pt Thursday 6/16. Message sent to scheduling for pt to have labs and NP visit. [name] Patsy Lager thanks and understanding.

## 2021-04-23 ENCOUNTER — Other Ambulatory Visit (HOSPITAL_COMMUNITY): Payer: Self-pay

## 2021-04-24 ENCOUNTER — Other Ambulatory Visit (HOSPITAL_COMMUNITY): Payer: Self-pay

## 2021-04-24 ENCOUNTER — Encounter: Payer: Self-pay | Admitting: Oncology

## 2021-04-24 MED ORDER — LISINOPRIL-HYDROCHLOROTHIAZIDE 20-12.5 MG PO TABS: ORAL_TABLET | ORAL | 3 refills | Status: DC

## 2021-04-24 MED ORDER — PREGABALIN 25 MG PO CAPS
25.0000 mg | ORAL_CAPSULE | Freq: Two times a day (BID) | ORAL | 3 refills | Status: DC | PRN
  Filled 2021-04-24: qty 60, 30d supply, fill #0

## 2021-04-24 MED ORDER — LISINOPRIL-HYDROCHLOROTHIAZIDE 20-12.5 MG PO TABS
ORAL_TABLET | ORAL | 3 refills | Status: DC
  Filled 2021-04-24: qty 90, 90d supply, fill #0

## 2021-04-24 MED ORDER — CLOPIDOGREL BISULFATE 75 MG PO TABS
1.0000 | ORAL_TABLET | Freq: Every day | ORAL | 3 refills | Status: DC
  Filled 2021-04-24: qty 90, 90d supply, fill #0

## 2021-04-24 MED ORDER — CLOPIDOGREL BISULFATE 75 MG PO TABS
75.0000 mg | ORAL_TABLET | Freq: Every day | ORAL | 3 refills | Status: DC
  Filled 2021-09-10: qty 90, 90d supply, fill #0

## 2021-04-26 ENCOUNTER — Other Ambulatory Visit: Payer: Self-pay

## 2021-04-26 ENCOUNTER — Other Ambulatory Visit (HOSPITAL_COMMUNITY): Payer: Self-pay

## 2021-04-26 ENCOUNTER — Other Ambulatory Visit: Payer: Self-pay | Admitting: Oncology

## 2021-04-26 ENCOUNTER — Inpatient Hospital Stay: Payer: Medicare Other | Attending: Oncology

## 2021-04-26 ENCOUNTER — Inpatient Hospital Stay (HOSPITAL_BASED_OUTPATIENT_CLINIC_OR_DEPARTMENT_OTHER): Payer: Medicare Other | Admitting: Adult Health

## 2021-04-26 VITALS — BP 146/49 | HR 99 | Temp 99.1°F | Resp 18 | Ht 66.0 in | Wt 228.2 lb

## 2021-04-26 DIAGNOSIS — C50911 Malignant neoplasm of unspecified site of right female breast: Secondary | ICD-10-CM

## 2021-04-26 DIAGNOSIS — L539 Erythematous condition, unspecified: Secondary | ICD-10-CM | POA: Diagnosis not present

## 2021-04-26 DIAGNOSIS — C50811 Malignant neoplasm of overlapping sites of right female breast: Secondary | ICD-10-CM | POA: Diagnosis not present

## 2021-04-26 LAB — CBC WITH DIFFERENTIAL (CANCER CENTER ONLY)
Abs Immature Granulocytes: 0.03 10*3/uL (ref 0.00–0.07)
Basophils Absolute: 0 10*3/uL (ref 0.0–0.1)
Basophils Relative: 1 %
Eosinophils Absolute: 0.1 10*3/uL (ref 0.0–0.5)
Eosinophils Relative: 2 %
HCT: 34 % — ABNORMAL LOW (ref 36.0–46.0)
Hemoglobin: 10.4 g/dL — ABNORMAL LOW (ref 12.0–15.0)
Immature Granulocytes: 1 %
Lymphocytes Relative: 19 %
Lymphs Abs: 1.2 10*3/uL (ref 0.7–4.0)
MCH: 23.8 pg — ABNORMAL LOW (ref 26.0–34.0)
MCHC: 30.6 g/dL (ref 30.0–36.0)
MCV: 77.8 fL — ABNORMAL LOW (ref 80.0–100.0)
Monocytes Absolute: 0.9 10*3/uL (ref 0.1–1.0)
Monocytes Relative: 14 %
Neutro Abs: 4.3 10*3/uL (ref 1.7–7.7)
Neutrophils Relative %: 63 %
Platelet Count: 213 10*3/uL (ref 150–400)
RBC: 4.37 MIL/uL (ref 3.87–5.11)
RDW: 17.7 % — ABNORMAL HIGH (ref 11.5–15.5)
WBC Count: 6.7 10*3/uL (ref 4.0–10.5)
nRBC: 0 % (ref 0.0–0.2)

## 2021-04-26 LAB — CMP (CANCER CENTER ONLY)
ALT: 19 U/L (ref 0–44)
AST: 18 U/L (ref 15–41)
Albumin: 3.5 g/dL (ref 3.5–5.0)
Alkaline Phosphatase: 109 U/L (ref 38–126)
Anion gap: 5 (ref 5–15)
BUN: 16 mg/dL (ref 8–23)
CO2: 27 mmol/L (ref 22–32)
Calcium: 8.9 mg/dL (ref 8.9–10.3)
Chloride: 107 mmol/L (ref 98–111)
Creatinine: 1.19 mg/dL — ABNORMAL HIGH (ref 0.44–1.00)
GFR, Estimated: 48 mL/min — ABNORMAL LOW (ref >60)
Glucose, Bld: 148 mg/dL — ABNORMAL HIGH (ref 70–99)
Potassium: 3.9 mmol/L (ref 3.5–5.1)
Sodium: 139 mmol/L (ref 135–145)
Total Bilirubin: 0.3 mg/dL (ref 0.3–1.2)
Total Protein: 7.2 g/dL (ref 6.5–8.1)

## 2021-04-26 MED ORDER — CAPECITABINE 500 MG PO TABS
1000.0000 mg | ORAL_TABLET | Freq: Two times a day (BID) | ORAL | 6 refills | Status: DC
  Filled 2021-04-26: qty 30, 8d supply, fill #0

## 2021-04-26 NOTE — Progress Notes (Signed)
Right chest wall tumor 6.16.2022

## 2021-04-27 ENCOUNTER — Other Ambulatory Visit (HOSPITAL_COMMUNITY): Payer: Self-pay

## 2021-04-28 ENCOUNTER — Encounter: Payer: Self-pay | Admitting: Adult Health

## 2021-04-28 NOTE — Progress Notes (Addendum)
[name] [name]  Telephone:([phone]) [phone] Fax:(336) [phone]    ID: [name] [name] DOB: Nov 08, 1946  MR#: [id number]  TMH#:[id number]  Patient Care Team: [name] Carol, MD as PCP - General (Internal Medicine) [name] Sandy, MD as Consulting Physician (Vascular Surgery) [name] Berthold, NP as Nurse Practitioner (Cardiology) [name] Staggers, MD as Consulting Physician (Physical Medicine and Rehabilitation) [name], [name], NP (Inactive) as Nurse Practitioner (Vascular Surgery) [name] Roys, PsyD as Consulting Physician (Psychology) [name], [name] Dad, MD as Consulting Physician (Oncology) [name] Kussmaul, MD as Consulting Physician (General Surgery) [name] Gibson, MD as Attending Physician (Radiation Oncology) OTHER MD:   CHIEF COMPLAINT: Inflammatory breast cancer  CURRENT TREATMENT: To start palliative radiation therapy   INTERVAL HISTORY: [name] [name] returns today for follow-up and treatment of her inflammatory breast cancer accompanied by her daughter.   She had received palliative radiation to thright breast, 26 Gy in 5 factions from 08/14/2020 through 08/18/2020.  Since that time she has continued on observation alone.    She is here today for right bresat tumor growth and weeping.  She wants to know what to do.  Also. She has some redness, and pain over her right heel.  She says it is worse when she has her foot down on the ground, and keeps her feet on the ground throughout the day without a lot of movement.  She denies any other pain, cough, shortness  of breath or further concerns today.     REVIEW OF SYSTEMS: Review of Systems  Constitutional:  Negative for appetite change, chills, fatigue, fever and unexpected weight change.  HENT:   Negative for hearing loss, lump/mass and trouble swallowing.   Eyes:  Negative for eye problems and icterus.  Respiratory:  Negative for chest tightness, cough and shortness of breath.   Cardiovascular:  Negative for  chest pain, leg swelling and palpitations.  Gastrointestinal:  Negative for abdominal distention, abdominal pain, constipation, diarrhea, nausea and vomiting.  Endocrine: Negative for hot flashes.  Genitourinary:  Negative for difficulty urinating.   Musculoskeletal:  Negative for arthralgias.  Skin:  Negative for itching and rash.  Neurological:  Negative for dizziness, extremity weakness, headaches and numbness.  Hematological:  Negative for adenopathy. Does not bruise/bleed easily.  Psychiatric/Behavioral:  Negative for depression. The patient is not nervous/anxious.    HISTORY OF CURRENT ILLNESS: From the original intake note:  [name] [name] presented with right breast discoloration and redness. The right breast also had an area of hardness and nipple retraction.  She did not bring this to medical attention but her daughter did mention it when the patient had neurologic follow-up on 08/10/2018.  At that time the nurse practitioner, [name] [name], noted right nipple inversion, hard breast, and red non-ulcerated lesions on the breast.  The patient then underwent bilateral diagnostic mammography with tomography and right breast ultrasonography at [name] on 08/11/2018 (this is her first ever mammogram) showing: breast density category B. There was a round 2.5 cm mass at the 1 o'clock upper inner quadrant of the right breast.  By palpation this measured approximately 10 cm.  Sonographically the irregular hypoechoic mass measured 2.7 cm. There was also a hypoechoic mass in the right breast at 9 o'clock upper outer quadrant measuring 3.8 cm. There was an abnormal right axillary lymph node with cortical thickening.   Accordingly on 08/19/2018 she proceeded to biopsy of the right breast areas in question. The pathology from this procedure showed [id number]): At the 1 o'clock: invasive lobular carcinoma, grade  II. Prognostic indicators significant for: estrogen receptor, 60% positive with moderate staining  intensity and progesterone receptor, 0% negative. Proliferation marker Ki67 at 20%. HER2 negative with an immunohistochemistry of (1+).  At the 9 o'clock: invasive lobular carcinoma, grade II. Prognostic indicators significant for: estrogen receptor, 90% positive with strong staining intensity and progesterone receptor, 0% negative. Proliferation marker Ki67 at 30%. HER2 negative with an immunohistochemistry of (1+).  The patient's subsequent history is as detailed below.   PAST MEDICAL HISTORY: Past Medical History:  Diagnosis Date   Anxiety    Cancer [name] [name])    breast cancer- right   Carotid stenosis    Depression    situational   Dyspnea    with much ambulation   GERD (gastroesophageal reflux disease)    Hypertension    Stroke ([name] [name]) 04/01/2017   a little weakness right side leg and hand, a little memry loss    PAST SURGICAL HISTORY: Past Surgical History:  Procedure Laterality Date   ABDOMINAL HYSTERECTOMY     ENDARTERECTOMY Left 04/08/2017   Procedure: ENDARTERECTOMY CAROTID;  Surgeon: [name] Sandy, MD;  Location: [facility];  Service: Vascular;  Laterality: Left;   PATCH ANGIOPLASTY Left 04/08/2017   Procedure: PATCH ANGIOPLASTY Left Carotid;  Surgeon: [name] Sandy, MD;  Location: [facility];  Service: Vascular;  Laterality: Left;   PORTACATH PLACEMENT Left 09/07/2018   Procedure: INSERTION PORT-A-CATH LEFT INTERNAL JUGULAR;  Surgeon: [name] Kussmaul, MD;  Location: [facility];  Service: General;  Laterality: Left;  total hysterectomy with bilateral salpingo-oophorectomy in her 16's no hormone replacement   FAMILY HISTORY: Family History  Problem Relation Age of Onset   Cancer Mother        ovarian   The patient's father is alive at age 37. The patient's mother died at age 37 due to ovarian cancer. The patient had 3 brothers and 3 sisters. She denies a history of breast, pancreatic, colon, or other cancers in the family.    GYNECOLOGIC HISTORY:  No LMP  recorded. Patient has had a hysterectomy. Menarche: Age at first live birth: 75 years old She is GX P1.  She is status post total hysterectomy with BSO in her early 31's. She did not use HRT.    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. She is divorced. At home is just herself, but her two grandsons regularly come to visit and stay with her. The patient's daughter, [name] works in administration for [employer]. The patient's 14 y.o. grandson is a Freight forwarder for [employer]. The patient's 39 y.o. grandson works in [employer].    ADVANCED DIRECTIVES: Not in place.  At the 08/26/2018 visit the patient was given the appropriate documents to complete and notarized at her discretion.   HEALTH MAINTENANCE: Social History   Tobacco Use   Smoking status: Never   Smokeless tobacco: Never  Vaping Use   Vaping Use: Never used  Substance Use Topics   Alcohol use: No   Drug use: No     Colonoscopy: Never  PAP: Status post hysterectomy  Bone density: Never   No Known Allergies  Current Outpatient Medications  Medication Sig Dispense Refill   capecitabine (XELODA) 500 MG tablet Take 2 tablets (1,000 mg total) by mouth 2 (two) times daily after a meal. 30 tablet 6   amLODipine (NORVASC) 10 MG tablet      amLODipine (NORVASC) 10 MG tablet TAKE 1 TABLET BY MOUTH ONCE DAILY 90 tablet  3   amoxicillin-clavulanate (AUGMENTIN) 875-125 MG tablet TAKE 1 TABLET BY MOUTH TWICE DAILY 14 tablet 0   atorvastatin (LIPITOR) 80 MG tablet TAKE 1 TABLET BY MOUTH ONCE DAILY FOR 14 DAYS     atorvastatin (LIPITOR) 80 MG tablet TAKE 1 TABLET BY MOUTH ONCE DAILY 90 tablet 0   atorvastatin (LIPITOR) 80 MG tablet TAKE 1 TABLET BY MOUTH ONCE DAILY 90 tablet 3   clopidogrel (PLAVIX) 75 MG tablet TAKE 1 TABLET BY MOUTH ONCE DAILY FOR 14 DAYS     clopidogrel (PLAVIX) 75 MG tablet TAKE 1 TABLET BY MOUTH ONCE DAILY 90 tablet 3   clopidogrel (PLAVIX) 75 MG tablet Take 1 tablet (75 mg  total) by mouth daily. 90 tablet 3   latanoprost (XALATAN) 0.005 % ophthalmic solution Place 1 drop into both eyes daily.     latanoprost (XALATAN) 0.005 % ophthalmic solution PLACE 1 DROP IN BOTH EYES EVERY EVENING 7.5 mL 3   lisinopril-hydrochlorothiazide (ZESTORETIC) 20-12.5 MG tablet      lisinopril-hydrochlorothiazide (ZESTORETIC) 20-12.5 MG tablet TAKE 1 TABLET BY MOUTH ONCE DAILY 90 tablet 3   lisinopril-hydrochlorothiazide (ZESTORETIC) 20-12.5 MG tablet Take 1 tablet by mout once daily 90 tablet 3   lisinopril-hydrochlorothiazide (ZESTORETIC) 20-12.5 MG tablet Take 1 tablet by mouth once a day 90 tablet 3   lisinopril-hydrochlorothiazide (ZESTORETIC) 20-12.5 MG tablet Take 1 tablet by mouth daily 90 tablet 3   loratadine (CLARITIN) 10 MG tablet Take 10 mg by mouth daily as needed for allergies.     mirtazapine (REMERON) 7.5 MG tablet Take by mouth.  (Patient not taking: Reported on 08/04/2020)     potassium chloride SA (K-DUR,KLOR-CON) 20 MEQ tablet Take 1 tablet (20 mEq total) by mouth daily. 30 tablet 0   pregabalin (LYRICA) 25 MG capsule Take 1 capsule twice daily for pain 60 capsule 0   pregabalin (LYRICA) 25 MG capsule Take 1 capsule (25 mg total) by mouth 2 (two) times daily as needed for pain 60 capsule 3   No current facility-administered medications for this visit.    OBJECTIVE: African-American woman who appears older than stated age  65:   04/26/21 1425  BP: (!) 146/49  Pulse: 99  Resp: 18  Temp: 99.1 F (37.3 C)  SpO2: 100%   Wt Readings from Last 3 Encounters:  04/26/21 228 lb 3.2 oz (103.5 kg)  01/30/21 208 lb 4.8 oz (94.5 kg)  06/07/20 (!) 207 lb (93.9 kg)   Body mass index is 36.83 kg/m.    ECOG FS:2 - Symptomatic, <50% confined to bed GENERAL: Patient is an older woman, examined in wheelchair in no apparent distress. HEENT:  Sclerae anicteric.  Oropharynx clear and moist. No ulcerations or evidence of oropharyngeal candidiasis. Neck is supple.   NODES:  No cervical, supraclavicular, or axillary lymphadenopathy palpated.  BREAST EXAM:  see picture below LUNGS:  Clear to auscultation bilaterally.  No wheezes or rhonchi. HEART:  Regular rate and rhythm. No murmur appreciated. ABDOMEN:  Soft, nontender.  Positive, normoactive bowel sounds. No organomegaly palpated. MSK:  No focal spinal tenderness to palpation. Full range of motion bilaterally in the upper extremities. EXTREMITIES:  No peripheral edema.   SKIN:  Clear with no obvious rashes or skin changes. No nail dyscrasia. Right heel with non blanchable erythema over the posterior portion.  No warmth or swelling noted.  No drainage or ulceration/skin breakdown noted. NEURO:  Nonfocal. Well oriented.  Appropriate affect.  Right breast 04/26/2021  Right breast 05/24/2020  Right breast 04/19/2020      Right breast 02/02/2020     Right breast 08/12/2019    Right Breast 04/07/2019   LAB RESULTS:  CMP     Component Value Date/Time   NA 139 04/26/2021 1347   K 3.9 04/26/2021 1347   CL 107 04/26/2021 1347   CO2 27 04/26/2021 1347   GLUCOSE 148 (H) 04/26/2021 1347   BUN 16 04/26/2021 1347   CREATININE 1.19 (H) 04/26/2021 1347   CALCIUM 8.9 04/26/2021 1347   PROT 7.2 04/26/2021 1347   ALBUMIN 3.5 04/26/2021 1347   AST 18 04/26/2021 1347   ALT 19 04/26/2021 1347   ALKPHOS 109 04/26/2021 1347   BILITOT 0.3 04/26/2021 1347   GFRNONAA 48 (L) 04/26/2021 1347   GFRAA >60 05/24/2020 1522   GFRAA 58 (L) 11/03/2018 1017    No results found for: TOTALPROTELP, ALBUMINELP, A1GS, A2GS, BETS, BETA2SER, GAMS, MSPIKE, SPEI  No results found for: KPAFRELGTCHN, LAMBDASER, KAPLAMBRATIO  Lab Results  Component Value Date   WBC 6.7 04/26/2021   NEUTROABS 4.3 04/26/2021   HGB 10.4 (L) 04/26/2021   HCT 34.0 (L) 04/26/2021   MCV 77.8 (L) 04/26/2021   PLT 213 04/26/2021    No results found for: LABCA2  No components found for: DHWYSH683  No results for input(s): INR  in the last 168 hours.  No results found for: LABCA2  No results found for: FGB021  No results found for: JDB520  No results found for: EYE233  Lab Results  Component Value Date   CA2729 78.6 (H) 08/18/2019    No components found for: HGQUANT  No results found for: CEA1 / No results found for: CEA1   No results found for: AFPTUMOR  No results found for: CHROMOGRNA  No results found for: HGBA, HGBA2QUANT, HGBFQUANT, HGBSQUAN (Hemoglobinopathy evaluation)   No results found for: LDH  No results found for: IRON, TIBC, IRONPCTSAT (Iron and TIBC)  No results found for: FERRITIN  Urinalysis    Component Value Date/Time   COLORURINE AMBER (A) 04/01/2017 0920   APPEARANCEUR CLOUDY (A) 04/01/2017 0920   LABSPEC 1.021 04/01/2017 0920   PHURINE 5.0 04/01/2017 0920   GLUCOSEU NEGATIVE 04/01/2017 0920   HGBUR MODERATE (A) 04/01/2017 0920   BILIRUBINUR NEGATIVE 04/01/2017 0920   KETONESUR 80 (A) 04/01/2017 0920   PROTEINUR NEGATIVE 04/01/2017 0920   NITRITE NEGATIVE 04/01/2017 0920   LEUKOCYTESUR LARGE (A) 04/01/2017 0920    STUDIES: No results found.   ELIGIBLE FOR AVAILABLE RESEARCH PROTOCOL: no   ASSESSMENT: 75 y.o. [place], [place] woman status post right breast biopsy of overlapping sites on 08/19/2018 for a clinical T4 N1, stage IIIB invasive lobular carcinoma, estrogen receptor positive, progesterone receptor negative, HER-2 not amplified, with an MIB-1 of 20%.  (a) CT chest/abd/pelvis 09/02/2018 and bone scan 09/10/2018 show no evidence of stage IV disease  (b) breast MRI 09/14/2018 confirms a multicentric 12.6 cm right breast mass infiltrating pectpralis but w/o associated adenopathy  (1) genetics testing to be scheduled  (2) neoadjuvant chemotherapy consisting of cyclophosphamide, methotrexate and fluorouracil every 21 days x 8, started 09/16/2018, last dose 12/18/2018  (a) CMF chemotherapy stopped after 4 cycles, with no evidence of response by MRI NOV  2019  (5) anastrozole started February 2019  (a) bone density to be obtained  (b) palbociclib added 01/19/2019 at 125 mg daily, 21/7  (c) palbociclib dose decreased to 100 mg daily 21 on 7 off, starting with second cycle, April 2020  (d) breast MRI  05/05/2019 shows slight improvement, no evidence of progression, however the tumor is not yet resectable.  Chest CT scan same day shows no evidence of metastatic disease  (e) anastrozole and palbociclib discontinued 08/12/2019 with no significant response  (6) foundation 1/PD-L1 testing:  (a) PD-L1 requested 12/18/2018--insufficient tissue  (b) foundation one testing shows the microsatellite status to be stable, and the mutational burden to be 16/Mb  (c) there are potentially actionable mutations in AKT3, TSC1, KRAS  MAPK1 and RB1  (7) started paclitaxel 08/18/2019, given days 1 and 8 of each 21-28-day cycle  (a) changed to every 14 days beginning with second cycle per patient preference  (b) discontinued after 10/27/2019 dose with no evidence of response (minimal evidence of progression)  (8) fulvestrant started 11/10/2019.  (a) discontinued after 02/02/2020 dose as no evidence of response  (9) definitive surgery considered, felt not to be possible given local extent of disease  (10) adjuvant radiation considered May 2021, patient opted against  (11) started exemestane mid May 2021   (a) started everolimus 04/17/2020--discontinued after repeated diarrhea episodes  (b) exemestane and everolimus discontinued 05/24/2020 with evidence of disease progression  (12) Palliative radiation to the right chest wall tumor 08/14/2020-08/18/2020  (13) Capecitabine 1064m BID to start 04/29/2021   PLAN:  CLilliemaeis here today for follow up of her inflammatory breast cancer.  Her tumor has progressed through the skin.  She is struggling with managing this and it is weeping.  She and I discussed managing the wound and the possibility of home health coming  in to help dress it.    She also met with Dr. MJana Hakimtoday who discussed Capecitabine versus continued palliative care/hospice.  Please see his addendum to this note.  Her heel erythema appears to be pressure related.  I recommended that she elevate her feet when sitting and we will see how it does.    CAtheniawill return on 7/26 for labs, f/u with Dr. MJana Hakim  She knows to call for any questions that may arise between now and her next appointment.  We are happy to see her sooner if needed.  Total encounter time 40 minutes.*Wilber Bihari NP 04/28/21 12:15 PM Medical Oncology and Hematology [name] [id number]. [phone]   Fax. [phone]  ADDENDUM: Ms. SVidriocancer is again active and now has spread to the opposite breast. She is willing to consider some treatment. We discussed capecitabine at standard and metronomic doses. The patient and her daughter have a good understanding of the possible side effects, toxicities and complications of this agent. CPaisleyopted for the metronomic dose (which is consistent with her prior decisions re treatment) and we will try to obtain that for her at little or no cost.  I set her up for a virtual visit a short time after she starts the medicaiton to assess tolerance. She will see uKoreagain in 8-12 weeks to assess response.  I personally saw this patient and performed a substantive portion of this encounter with the listed APP documented above.   GChauncey Cruel MD Medical Oncology and Hematology CT[facility]AB[place] [address]Tel. [phone]   Fax. [phone]    *Total Encounter Time as defined by the Centers for Medicare and Medicaid Services includes, in addition to the face-to-face time of a patient visit (documented in the note above) non-face-to-face time: obtaining and reviewing outside history, ordering and  reviewing  medications, tests or procedures, care coordination (communications with other health care professionals or caregivers) and documentation in the medical record.

## 2021-04-29 ENCOUNTER — Encounter: Payer: Self-pay | Admitting: Oncology

## 2021-04-30 ENCOUNTER — Telehealth: Payer: Self-pay

## 2021-04-30 ENCOUNTER — Telehealth: Payer: Self-pay | Admitting: Pharmacist

## 2021-04-30 ENCOUNTER — Encounter: Payer: Self-pay | Admitting: Oncology

## 2021-04-30 ENCOUNTER — Other Ambulatory Visit (HOSPITAL_COMMUNITY): Payer: Self-pay

## 2021-04-30 DIAGNOSIS — C50911 Malignant neoplasm of unspecified site of right female breast: Secondary | ICD-10-CM

## 2021-04-30 MED ORDER — CAPECITABINE 500 MG PO TABS
1000.0000 mg | ORAL_TABLET | Freq: Every day | ORAL | 6 refills | Status: DC
  Filled 2021-04-30 – 2021-05-01 (×2): qty 60, 30d supply, fill #0

## 2021-04-30 NOTE — Telephone Encounter (Signed)
Oral Chemotherapy Pharmacist Encounter   Attempted to reach patient to provide update and offer for initial counseling on oral medication: Xeloda (capecitabine).   No answer. Left voicemail for patient to call back to discuss details of medication acquisition and initial counseling session.  Leron Croak, PharmD, BCPS Hematology/Oncology Clinical Pharmacist [facility] [phone] 04/30/2021 3:06 PM

## 2021-04-30 NOTE — Telephone Encounter (Addendum)
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  Prescription dose and frequency assessed for appropriateness. Patient will do once daily metronomic dosing per Dr. Jana Hakim. Confirmed with Dr. Jana Hakim that plan is for patient to take 2 tablets (1000 mg total) by mouth daily after breakfast.   CMP and CBC w/ Diff from 04/26/21 assessed, noted Scr 1.19 mg/dL - given metronomic dosing, no further dose adjustments required at this time.   Current medication list in Epic reviewed, no relevant/significant DDIs with Xeloda identified.  Evaluated chart and no patient barriers to medication adherence noted.   Patient agreement for treatment documented in MD note on 04/26/21.  Prescription has been e-scribed to the [facility] for benefits analysis and approval.  Oral Oncology Clinic will continue to follow for insurance authorization, copayment issues, initial counseling and start date.  Leron Croak, PharmD, BCPS Hematology/Oncology Clinical Pharmacist [facility] [phone] 04/30/2021 8:45 AM

## 2021-04-30 NOTE — Telephone Encounter (Signed)
[name] called and requested pain medication for mother's tumor on her (R) breast. [name] states this weekend pt began to complain it was painful. Request given to Dr Jana Hakim. Daughter aware and verbalized thanks.

## 2021-04-30 NOTE — Telephone Encounter (Signed)
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  Counseled on administration, dosing, side effects, monitoring, drug-food interactions, safe handling, storage, and disposal.  Patient will take Xeloda $RemoveBef'500mg'CstRFsBWtB$  tablets, 2 tablets (1000 mg total) by mouth daily, within 30 minutes of finishing breakfast.  Xeloda start date: 05/02/21  Adverse effects include but are not limited to: fatigue, decreased blood counts, GI upset, diarrhea, mouth sores, and hand-foot syndrome.  Patient already has anti diarrheal agent at home and alert the office of 4 or more loose stools above baseline.  Reviewed importance of keeping a medication schedule and plan for any missed doses. No barriers to medication adherence identified.  Medication reconciliation performed and medication/allergy list updated.  This will ship from the [facility] on 05/01/21 to deliver to patient's home on 05/02/21.  Patient informed the pharmacy will reach out 5-7 days prior to needing next fill of Xeloda to coordinate continued medication acquisition to prevent break in therapy.  All questions answered.  Patient's daughter voiced understanding and appreciation.   Medication education handout placed in mail for patient. Patient and family know to call the office with questions or concerns. Oral Chemotherapy Clinic phone number provided.  Leron Croak, PharmD, BCPS Hematology/Oncology Clinical Pharmacist [facility] [phone] 04/30/2021 3:55 PM

## 2021-04-30 NOTE — Telephone Encounter (Signed)
Oral Oncology Patient Advocate Encounter  After completing a benefits investigation, prior authorization for Xeloda is not required at this time through Medicare B.  Patient's copay is $14.36.     [place] [place] [facility] [place] Phone [phone] Fax [phone] 04/30/2021 8:24 AM

## 2021-05-01 ENCOUNTER — Other Ambulatory Visit (HOSPITAL_COMMUNITY): Payer: Self-pay

## 2021-05-01 ENCOUNTER — Other Ambulatory Visit: Payer: Self-pay | Admitting: Oncology

## 2021-05-01 ENCOUNTER — Telehealth: Payer: Self-pay | Admitting: *Deleted

## 2021-05-01 ENCOUNTER — Encounter: Payer: Self-pay | Admitting: Oncology

## 2021-05-01 MED ORDER — TRAMADOL HCL 50 MG PO TABS
50.0000 mg | ORAL_TABLET | Freq: Four times a day (QID) | ORAL | 0 refills | Status: DC | PRN
  Filled 2021-05-01: qty 60, 8d supply, fill #0

## 2021-05-01 NOTE — Telephone Encounter (Signed)
This RN called and informed the patient's daughter and caregiver that MD sent tramadol to the [facility] pharmacy for pain issues.  Reviewed benefits and side effects with recommendation for pt to start a stool softener due to possible constipation.  [name] verbalized understanding.

## 2021-05-01 NOTE — Progress Notes (Signed)
[facility]  Telephone:([phone]) [phone] Fax:(336) [phone]    ID: [name] [name] DOB: September 13, 1946  MR#: [id number]  EKC#:[id number]  Patient Care Team: [name] Carol, MD as PCP - General (Internal Medicine) [name] Sandy, MD as Consulting Physician (Vascular Surgery) [name] Berthold, NP as Nurse Practitioner (Cardiology) [name] Staggers, MD as Consulting Physician (Physical Medicine and Rehabilitation) [name] [name], [name] Leyden, NP (Inactive) as Nurse Practitioner (Vascular Surgery) [name] Roys, PsyD as Consulting Physician (Psychology) [name] [name], [name] Dad, MD as Consulting Physician (Oncology) [name] Kussmaul, MD as Consulting Physician (General Surgery) [name] Gibson, MD as Attending Physician (Radiation Oncology) OTHER MD:  I connected with [name] [name] on 05/01/21 at  3:30 PM EDT by telephone visit and verified that I am speaking with the correct person using two identifiers.   I discussed the limitations, risks, security and privacy concerns of performing an evaluation and management service by telemedicine and the availability of in-person appointments. I also discussed with the patient that there may be a patient responsible charge related to this service. The patient expressed understanding and agreed to proceed.   Other persons participating in the visit and their role in the encounter: daughter   Patient's location: Home Provider's location: [place]  Total time spent: 10 min   CHIEF COMPLAINT: Inflammatory breast cancer  CURRENT TREATMENT: capecitabine   INTERVAL HISTORY: I attempted to contact [name] [name] today for follow-up of her inflammatory breast cancer.  However she did not answer her phone and there was no voicemail option  I called her daughter [name] [name], who was at work.  She was not aware that there was a virtual visit today.  She tells me that as far she knows her mother will be receiving the capecitabine today and will be  starting it today or tomorrow.  They had no questions or concerns regarding that  She is scheduled to begin capecitabine today.   REVIEW OF SYSTEMS: Review of systems today could not be obtained   COVID 19 VACCINATION STATUS: [name] [name], most recently 04/2020   HISTORY OF CURRENT ILLNESS: From the original intake note:  [name] [name] presented with right breast discoloration and redness. The right breast also had an area of hardness and nipple retraction.  She did not bring this to medical attention but her daughter did mention it when the patient had neurologic follow-up on 08/10/2018.  At that time the nurse practitioner, [name] [name], noted right nipple inversion, hard breast, and red non-ulcerated lesions on the breast.  The patient then underwent bilateral diagnostic mammography with tomography and right breast ultrasonography at [name] on 08/11/2018 (this is her first ever mammogram) showing: breast density category B. There was a round 2.5 cm mass at the 1 o'clock upper inner quadrant of the right breast.  By palpation this measured approximately 10 cm.  Sonographically the irregular hypoechoic mass measured 2.7 cm. There was also a hypoechoic mass in the right breast at 9 o'clock upper outer quadrant measuring 3.8 cm. There was an abnormal right axillary lymph node with cortical thickening.   Accordingly on 08/19/2018 she proceeded to biopsy of the right breast areas in question. The pathology from this procedure showed [id number]): At the 1 o'clock: invasive lobular carcinoma, grade II. Prognostic indicators significant for: estrogen receptor, 60% positive with moderate staining intensity and progesterone receptor, 0% negative. Proliferation marker Ki67 at 20%. HER2 negative with an immunohistochemistry of (1+).  At the 9 o'clock: invasive lobular carcinoma, grade II. Prognostic indicators significant for: estrogen  receptor, 90% positive with strong staining intensity and progesterone  receptor, 0% negative. Proliferation marker Ki67 at 30%. HER2 negative with an immunohistochemistry of (1+).  The patient's subsequent history is as detailed below.   PAST MEDICAL HISTORY: Past Medical History:  Diagnosis Date   Anxiety    Cancer [name] [name])    breast cancer- right   Carotid stenosis    Depression    situational   Dyspnea    with much ambulation   GERD (gastroesophageal reflux disease)    Hypertension    Stroke ([place]) 04/01/2017   a little weakness right side leg and hand, a little memry loss    PAST SURGICAL HISTORY: Past Surgical History:  Procedure Laterality Date   ABDOMINAL HYSTERECTOMY     ENDARTERECTOMY Left 04/08/2017   Procedure: ENDARTERECTOMY CAROTID;  Surgeon: [name] Sandy, MD;  Location: [place];  Service: Vascular;  Laterality: Left;   PATCH ANGIOPLASTY Left 04/08/2017   Procedure: PATCH ANGIOPLASTY Left Carotid;  Surgeon: [name] Sandy, MD;  Location: [facility];  Service: Vascular;  Laterality: Left;   PORTACATH PLACEMENT Left 09/07/2018   Procedure: INSERTION PORT-A-CATH LEFT INTERNAL JUGULAR;  Surgeon: [name] Kussmaul, MD;  Location: [place];  Service: General;  Laterality: Left;  total hysterectomy with bilateral salpingo-oophorectomy in her 26's no hormone replacement   FAMILY HISTORY: Family History  Problem Relation Age of Onset   Cancer Mother        ovarian  The patient's father is alive at age 83. The patient's mother died at age 66 due to ovarian cancer. The patient had 3 brothers and 3 sisters. She denies a history of breast, pancreatic, colon, or other cancers in the family.    GYNECOLOGIC HISTORY:  No LMP recorded. Patient has had a hysterectomy. Menarche: Age at first live birth: 75 years old She is GX P1.  She is status post total hysterectomy with BSO in her early 75's. She did not use HRT.    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. She is divorced. At home is just herself,  but her two grandsons regularly come to visit and stay with her. The patient's daughter, [name] [name] works in administration for [name]. The patient's 28 y.o. grandson is a Freight forwarder for [employer]. The patient's 22 y.o. grandson works in [employer].    ADVANCED DIRECTIVES: Not in place.  At the 08/26/2018 visit the patient was given the appropriate documents to complete and notarized at her discretion.   HEALTH MAINTENANCE: Social History   Tobacco Use   Smoking status: Never   Smokeless tobacco: Never  Vaping Use   Vaping Use: Never used  Substance Use Topics   Alcohol use: No   Drug use: No     Colonoscopy: Never  PAP: Status post hysterectomy  Bone density: Never   No Known Allergies  Current Outpatient Medications  Medication Sig Dispense Refill   amLODipine (NORVASC) 10 MG tablet      amLODipine (NORVASC) 10 MG tablet TAKE 1 TABLET BY MOUTH ONCE DAILY 90 tablet 3   amoxicillin-clavulanate (AUGMENTIN) 875-125 MG tablet TAKE 1 TABLET BY MOUTH TWICE DAILY 14 tablet 0   atorvastatin (LIPITOR) 80 MG tablet TAKE 1 TABLET BY MOUTH ONCE DAILY FOR 14 DAYS     atorvastatin (LIPITOR) 80 MG tablet TAKE 1 TABLET BY MOUTH ONCE DAILY 90 tablet 0   atorvastatin (LIPITOR) 80 MG tablet TAKE 1 TABLET BY MOUTH ONCE DAILY 90 tablet 3  capecitabine (XELODA) 500 MG tablet Take 2 tablets (1,000 mg total) by mouth daily. Take within 30 minutes after breakfast. 60 tablet 6   clopidogrel (PLAVIX) 75 MG tablet TAKE 1 TABLET BY MOUTH ONCE DAILY FOR 14 DAYS     clopidogrel (PLAVIX) 75 MG tablet TAKE 1 TABLET BY MOUTH ONCE DAILY 90 tablet 3   clopidogrel (PLAVIX) 75 MG tablet Take 1 tablet (75 mg total) by mouth daily. 90 tablet 3   latanoprost (XALATAN) 0.005 % ophthalmic solution Place 1 drop into both eyes daily.     latanoprost (XALATAN) 0.005 % ophthalmic solution PLACE 1 DROP IN BOTH EYES EVERY EVENING 7.5 mL 3   lisinopril-hydrochlorothiazide (ZESTORETIC) 20-12.5 MG tablet       lisinopril-hydrochlorothiazide (ZESTORETIC) 20-12.5 MG tablet TAKE 1 TABLET BY MOUTH ONCE DAILY 90 tablet 3   lisinopril-hydrochlorothiazide (ZESTORETIC) 20-12.5 MG tablet Take 1 tablet by mout once daily 90 tablet 3   lisinopril-hydrochlorothiazide (ZESTORETIC) 20-12.5 MG tablet Take 1 tablet by mouth once a day 90 tablet 3   lisinopril-hydrochlorothiazide (ZESTORETIC) 20-12.5 MG tablet Take 1 tablet by mouth daily 90 tablet 3   loratadine (CLARITIN) 10 MG tablet Take 10 mg by mouth daily as needed for allergies.     mirtazapine (REMERON) 7.5 MG tablet Take by mouth.  (Patient not taking: Reported on 08/04/2020)     potassium chloride SA (K-DUR,KLOR-CON) 20 MEQ tablet Take 1 tablet (20 mEq total) by mouth daily. 30 tablet 0   pregabalin (LYRICA) 25 MG capsule Take 1 capsule twice daily for pain 60 capsule 0   pregabalin (LYRICA) 25 MG capsule Take 1 capsule (25 mg total) by mouth 2 (two) times daily as needed for pain 60 capsule 3   traMADol (ULTRAM) 50 MG tablet Take 1-2 tablets (50-100 mg total) by mouth every 6 (six) hours as needed. 60 tablet 0   No current facility-administered medications for this visit.    OBJECTIVE:   There were no vitals filed for this visit.  Wt Readings from Last 3 Encounters:  04/26/21 228 lb 3.2 oz (103.5 kg)  01/30/21 208 lb 4.8 oz (94.5 kg)  06/07/20 (!) 207 lb (93.9 kg)   There is no height or weight on file to calculate BMI.    ECOG FS:2 - Symptomatic, <50% confined to bed  Telemedicine visit 05/02/2021   Right breast 04/26/2021  Right breast 05/24/2020     Right breast 04/19/2020      Right breast 02/02/2020     Right breast 08/12/2019    Right Breast 04/07/2019   LAB RESULTS:  CMP     Component Value Date/Time   NA 139 04/26/2021 1347   K 3.9 04/26/2021 1347   CL 107 04/26/2021 1347   CO2 27 04/26/2021 1347   GLUCOSE 148 (H) 04/26/2021 1347   BUN 16 04/26/2021 1347   CREATININE 1.19 (H) 04/26/2021 1347   CALCIUM 8.9  04/26/2021 1347   PROT 7.2 04/26/2021 1347   ALBUMIN 3.5 04/26/2021 1347   AST 18 04/26/2021 1347   ALT 19 04/26/2021 1347   ALKPHOS 109 04/26/2021 1347   BILITOT 0.3 04/26/2021 1347   GFRNONAA 48 (L) 04/26/2021 1347   GFRAA >60 05/24/2020 1522   GFRAA 58 (L) 11/03/2018 1017    No results found for: TOTALPROTELP, ALBUMINELP, A1GS, A2GS, BETS, BETA2SER, GAMS, MSPIKE, SPEI  No results found for: KPAFRELGTCHN, LAMBDASER, KAPLAMBRATIO  Lab Results  Component Value Date   WBC 6.7 04/26/2021   NEUTROABS 4.3 04/26/2021  HGB 10.4 (L) 04/26/2021   HCT 34.0 (L) 04/26/2021   MCV 77.8 (L) 04/26/2021   PLT 213 04/26/2021    No results found for: LABCA2  No components found for: OIBBCW888  No results for input(s): INR in the last 168 hours.  No results found for: LABCA2  No results found for: BVQ945  No results found for: WTU882  No results found for: CMK349  Lab Results  Component Value Date   CA2729 78.6 (H) 08/18/2019    No components found for: HGQUANT  No results found for: CEA1 / No results found for: CEA1   No results found for: AFPTUMOR  No results found for: CHROMOGRNA  No results found for: HGBA, HGBA2QUANT, HGBFQUANT, HGBSQUAN (Hemoglobinopathy evaluation)   No results found for: LDH  No results found for: IRON, TIBC, IRONPCTSAT (Iron and TIBC)  No results found for: FERRITIN  Urinalysis    Component Value Date/Time   COLORURINE AMBER (A) 04/01/2017 0920   APPEARANCEUR CLOUDY (A) 04/01/2017 0920   LABSPEC 1.021 04/01/2017 0920   PHURINE 5.0 04/01/2017 0920   GLUCOSEU NEGATIVE 04/01/2017 0920   HGBUR MODERATE (A) 04/01/2017 0920   BILIRUBINUR NEGATIVE 04/01/2017 0920   KETONESUR 80 (A) 04/01/2017 0920   PROTEINUR NEGATIVE 04/01/2017 0920   NITRITE NEGATIVE 04/01/2017 0920   LEUKOCYTESUR LARGE (A) 04/01/2017 0920    STUDIES: No results found.   ELIGIBLE FOR AVAILABLE RESEARCH PROTOCOL: no   ASSESSMENT: 75 y.o. [place], [place] woman  status post right breast biopsy of overlapping sites on 08/19/2018 for a clinical T4 N1, stage IIIB invasive lobular carcinoma, estrogen receptor positive, progesterone receptor negative, HER-2 not amplified, with an MIB-1 of 20%.  (a) CT chest/abd/pelvis 09/02/2018 and bone scan 09/10/2018 show no evidence of stage IV disease  (b) breast MRI 09/14/2018 confirms a multicentric 12.6 cm right breast mass infiltrating pectpralis but w/o associated adenopathy  (1) genetics testing to be scheduled  (2) neoadjuvant chemotherapy consisting of cyclophosphamide, methotrexate and fluorouracil every 21 days x 8, started 09/16/2018, last dose 12/18/2018  (a) CMF chemotherapy stopped after 4 cycles, with no evidence of response by MRI NOV 2019  (5) anastrozole started February 2019  (a) bone density to be obtained  (b) palbociclib added 01/19/2019 at 125 mg daily, 21/7  (c) palbociclib dose decreased to 100 mg daily 21 on 7 off, starting with second cycle, April 2020  (d) breast MRI 05/05/2019 shows slight improvement, no evidence of progression, however the tumor is not yet resectable.  Chest CT scan same day shows no evidence of metastatic disease  (e) anastrozole and palbociclib discontinued 08/12/2019 with no significant response  (6) foundation 1/PD-L1 testing:  (a) PD-L1 requested 12/18/2018--insufficient tissue  (b) foundation one testing shows the microsatellite status to be stable, and the mutational burden to be 16/Mb  (c) there are potentially actionable mutations in AKT3, TSC1, KRAS  MAPK1 and RB1  (7) started paclitaxel 08/18/2019, given days 1 and 8 of each 21-28-day cycle  (a) changed to every 14 days beginning with second cycle per patient preference  (b) discontinued after 10/27/2019 dose with no evidence of response (minimal evidence of progression)  (8) fulvestrant started 11/10/2019.  (a) discontinued after 02/02/2020 dose as no evidence of response  (9) definitive surgery  considered, felt not to be possible given local extent of disease  (10) adjuvant radiation considered May 2021, patient opted against  (11) started exemestane mid May 2021   (a) started everolimus 04/17/2020--discontinued after repeated diarrhea episodes  (b)  exemestane and everolimus discontinued 05/24/2020 with evidence of disease progression  (12) Palliative radiation to the right chest wall tumor 08/14/2020-08/18/2020  (13) Capecitabine $RemoveBeforeDEI'1000mg'uhmFBgXexbTCUecp$  BID to start 04/29/2021   PLAN: I was unable to speak with Deundra today but I did speak with her daughter.  She understands we need to start this medication now.  She will be taking 2 tablets once a day which is a very minimal but sometimes effective dose.  They already have a follow-up appointment with me in July and the daughter is aware of the.  She also knows to call us with any questions or concerns that may develop before then.   [name] [name]. Magrinat, MD 05/01/21 7:48 PM Medical Oncology and Hematology [facility] [place], [place] [address] Tel. [phone]    Fax. [phone]   I, Wilburn Mylar, am acting as scribe for Dr. Virgie [name]. [name] [name].  I, Lurline Del MD, have reviewed the above documentation for accuracy and completeness, and I agree with the above.    *Total Encounter Time as defined by the Centers for Medicare and Medicaid Services includes, in addition to the face-to-face time of a patient visit (documented in the note above) non-face-to-face time: obtaining and reviewing outside history, ordering and reviewing medications, tests or procedures, care coordination (communications with other health care professionals or caregivers) and documentation in the medical record.

## 2021-05-02 ENCOUNTER — Inpatient Hospital Stay (HOSPITAL_BASED_OUTPATIENT_CLINIC_OR_DEPARTMENT_OTHER): Payer: Medicare Other | Admitting: Oncology

## 2021-05-02 ENCOUNTER — Other Ambulatory Visit (HOSPITAL_COMMUNITY): Payer: Self-pay

## 2021-05-02 DIAGNOSIS — C50811 Malignant neoplasm of overlapping sites of right female breast: Secondary | ICD-10-CM | POA: Diagnosis not present

## 2021-05-02 DIAGNOSIS — C50911 Malignant neoplasm of unspecified site of right female breast: Secondary | ICD-10-CM

## 2021-05-02 DIAGNOSIS — Z170 Estrogen receptor positive status [ER+]: Secondary | ICD-10-CM | POA: Diagnosis not present

## 2021-05-03 ENCOUNTER — Telehealth: Payer: Self-pay | Admitting: Oncology

## 2021-05-03 ENCOUNTER — Other Ambulatory Visit (HOSPITAL_COMMUNITY): Payer: Self-pay

## 2021-05-03 ENCOUNTER — Telehealth: Payer: Medicare Other | Admitting: Oncology

## 2021-05-03 NOTE — Telephone Encounter (Signed)
Scheduled appointment per 06/22 los. Patient is aware.

## 2021-05-08 ENCOUNTER — Telehealth: Payer: Self-pay

## 2021-05-08 NOTE — Telephone Encounter (Signed)
12:20 PM: Palliative care SW outreached patient/family for monthly telephonic visit and to schedule in person visit.   Call unsuccessful. SW left HIPPA compliant VM.  Awaiting return call. Will outreach again at later date and continue to offer palliative care support.

## 2021-05-09 ENCOUNTER — Other Ambulatory Visit (HOSPITAL_COMMUNITY): Payer: Self-pay

## 2021-05-10 ENCOUNTER — Other Ambulatory Visit: Payer: Self-pay

## 2021-05-10 ENCOUNTER — Telehealth: Payer: Medicare Other

## 2021-05-10 DIAGNOSIS — Z515 Encounter for palliative care: Secondary | ICD-10-CM | POA: Diagnosis not present

## 2021-05-10 NOTE — Progress Notes (Signed)
FOLLOW UP PALLIATIVE CARE CONSULT/SOCIAL WORK VISIT NOTE  PATIENT NAME: [name] [name] DOB: [birth year] MRN: [id number]  REFERRING PROVIDER: [name], MD [address] [employer] [address] [place],  [place] [address]   RESPONSIBLE PARTY:  Acct ID - Guarantor Home Phone Work Phone Relationship Acct Type  [id number] [facility]* [phone]  Self P/F     [address], [place], [place] [address]     Palliative care SW outreached patient to complete telephonic visit.   Palliative care SW outreached patient to complete telephonic visit. Patients daughter, [name],  provided update on medical condition and/or changes. Patient has had a new growth of cancer in both breast. Patient has had a couple of oncology appointments since last PC check in. Patient was started on a new chemotherapy for 30 days. Patient has been having pain in area of breast cancer, patient has tramadol ordered and available to take. No recent falls. Patient is not using any AD. Daughter shares that patient has become much slower in her ambulation.  Patients shares that her appetite has been fair. No noted nausea. Patient has general swelling in legs, daughter has not noticed a change in weight.   Patient has started new chemotherapy Xeloda (capecitabine) for 30 days and will f/u with Dr. Jana Hakim 7/26 for labs and review. Daughter denies any current adverse reactions to chemo at this time. Daughter shares is chemo is effective they may do another round. Patient has tramadol for pain when needed. Patient no longer taking ABT for foot soreness. Patients is not experiencing any issues with feet at this time. Family continues to encourage patient to keep feet elevated to alleviate pressure and swelling.   Patient does not sleep well/long at HS, due to lack of comfort and pain in both sides now due to cancer growth. Daughter shares that patient does nap during the day in recliner.   Daughter shares that other than patient's mobility  being slower, patient is still able to complete ADL's independently, family offers assistance. Daughter denies financial, food insecurities or issues with transportation. No other psychosocial needs. Daughter appreciative of telephonic check in and states that her goal os to make sure patient patient is comfortable.  Palliative care will continue to monitor and assist with long term care planning as needed. Palliative care will outreach again after next appt with oncology.   CODE STATUS: Full Code, will discuss more in upcoming visits. ADVANCED DIRECTIVES: Liberty Handy, LCSW

## 2021-05-10 NOTE — Progress Notes (Deleted)
COMMUNITY PALLIATIVE CARE SW NOTE  PATIENT NAME: [name] [name] DOB: Dec 22, 1945 MRN: [id number]  PRIMARY CARE PROVIDER: [name], MD  RESPONSIBLE PARTY:  Acct ID - Guarantor Home Phone Work Phone Relationship Acct Type  [id number] - [employer]* [phone]  Self P/F     [address], [place], [place] [address]     PLAN OF CARE and INTERVENTIONS:             GOALS OF CARE/ ADVANCE CARE PLANNING:  *** SOCIAL/EMOTIONAL/SPIRITUAL ASSESSMENT/ INTERVENTIONS:  *** PATIENT/CAREGIVER EDUCATION/ COPING:  *** PERSONAL EMERGENCY PLAN:  *** COMMUNITY RESOURCES COORDINATION/ HEALTH CARE NAVIGATION:  *** FINANCIAL/LEGAL CONCERNS/INTERVENTIONS:  ***     SOCIAL HX:  Social History   Tobacco Use   Smoking status: Never   Smokeless tobacco: Never  Substance Use Topics   Alcohol use: No    CODE STATUS:   Code Status: Prior  ADVANCED DIRECTIVES: N MOST FORM COMPLETE:  {Responses; yes/no} HOSPICE EDUCATION PROVIDED: ***  PPS:***      [place] Henrene Pastor, LCSW

## 2021-05-23 ENCOUNTER — Emergency Department (HOSPITAL_COMMUNITY): Payer: Medicare Other

## 2021-05-23 ENCOUNTER — Other Ambulatory Visit: Payer: Self-pay

## 2021-05-23 ENCOUNTER — Inpatient Hospital Stay (HOSPITAL_COMMUNITY)
Admission: EM | Admit: 2021-05-23 | Discharge: 2021-05-30 | DRG: 871 | Disposition: A | Discharge status: Skilled Nursing Facility | Payer: Medicare Other | Attending: Internal Medicine | Admitting: Internal Medicine

## 2021-05-23 DIAGNOSIS — A4189 Other specified sepsis: Secondary | ICD-10-CM | POA: Diagnosis not present

## 2021-05-23 DIAGNOSIS — R000 Tachycardia, unspecified: Secondary | ICD-10-CM | POA: Diagnosis not present

## 2021-05-23 DIAGNOSIS — U071 COVID-19: Secondary | ICD-10-CM | POA: Diagnosis present

## 2021-05-23 DIAGNOSIS — R918 Other nonspecific abnormal finding of lung field: Secondary | ICD-10-CM | POA: Diagnosis present

## 2021-05-23 DIAGNOSIS — R531 Weakness: Secondary | ICD-10-CM | POA: Diagnosis not present

## 2021-05-23 DIAGNOSIS — Z79891 Long term (current) use of opiate analgesic: Secondary | ICD-10-CM

## 2021-05-23 DIAGNOSIS — Z6836 Body mass index (BMI) 36.0-36.9, adult: Secondary | ICD-10-CM

## 2021-05-23 DIAGNOSIS — I69351 Hemiplegia and hemiparesis following cerebral infarction affecting right dominant side: Secondary | ICD-10-CM | POA: Diagnosis not present

## 2021-05-23 DIAGNOSIS — R29818 Other symptoms and signs involving the nervous system: Secondary | ICD-10-CM | POA: Diagnosis not present

## 2021-05-23 DIAGNOSIS — Z7902 Long term (current) use of antithrombotics/antiplatelets: Secondary | ICD-10-CM

## 2021-05-23 DIAGNOSIS — F419 Anxiety disorder, unspecified: Secondary | ICD-10-CM | POA: Diagnosis present

## 2021-05-23 DIAGNOSIS — I6623 Occlusion and stenosis of bilateral posterior cerebral arteries: Secondary | ICD-10-CM | POA: Diagnosis not present

## 2021-05-23 DIAGNOSIS — C50811 Malignant neoplasm of overlapping sites of right female breast: Secondary | ICD-10-CM | POA: Diagnosis present

## 2021-05-23 DIAGNOSIS — I129 Hypertensive chronic kidney disease with stage 1 through stage 4 chronic kidney disease, or unspecified chronic kidney disease: Secondary | ICD-10-CM | POA: Diagnosis not present

## 2021-05-23 DIAGNOSIS — N183 Chronic kidney disease, stage 3 unspecified: Secondary | ICD-10-CM | POA: Diagnosis present

## 2021-05-23 DIAGNOSIS — Z90710 Acquired absence of both cervix and uterus: Secondary | ICD-10-CM

## 2021-05-23 DIAGNOSIS — K219 Gastro-esophageal reflux disease without esophagitis: Secondary | ICD-10-CM | POA: Diagnosis present

## 2021-05-23 DIAGNOSIS — Z170 Estrogen receptor positive status [ER+]: Secondary | ICD-10-CM

## 2021-05-23 DIAGNOSIS — G9341 Metabolic encephalopathy: Secondary | ICD-10-CM | POA: Diagnosis present

## 2021-05-23 DIAGNOSIS — I10 Essential (primary) hypertension: Secondary | ICD-10-CM | POA: Diagnosis not present

## 2021-05-23 DIAGNOSIS — E785 Hyperlipidemia, unspecified: Secondary | ICD-10-CM | POA: Diagnosis present

## 2021-05-23 DIAGNOSIS — Z79899 Other long term (current) drug therapy: Secondary | ICD-10-CM

## 2021-05-23 DIAGNOSIS — Z9221 Personal history of antineoplastic chemotherapy: Secondary | ICD-10-CM

## 2021-05-23 DIAGNOSIS — Z8673 Personal history of transient ischemic attack (TIA), and cerebral infarction without residual deficits: Secondary | ICD-10-CM

## 2021-05-23 DIAGNOSIS — A419 Sepsis, unspecified organism: Secondary | ICD-10-CM | POA: Diagnosis present

## 2021-05-23 DIAGNOSIS — N179 Acute kidney failure, unspecified: Secondary | ICD-10-CM | POA: Diagnosis present

## 2021-05-23 DIAGNOSIS — R0602 Shortness of breath: Secondary | ICD-10-CM

## 2021-05-23 DIAGNOSIS — R2990 Unspecified symptoms and signs involving the nervous system: Secondary | ICD-10-CM

## 2021-05-23 DIAGNOSIS — I6503 Occlusion and stenosis of bilateral vertebral arteries: Secondary | ICD-10-CM | POA: Diagnosis not present

## 2021-05-23 DIAGNOSIS — I639 Cerebral infarction, unspecified: Secondary | ICD-10-CM | POA: Diagnosis not present

## 2021-05-23 DIAGNOSIS — E669 Obesity, unspecified: Secondary | ICD-10-CM | POA: Diagnosis present

## 2021-05-23 LAB — URINALYSIS, ROUTINE W REFLEX MICROSCOPIC
Bacteria, UA: NONE SEEN
Bilirubin Urine: NEGATIVE
Glucose, UA: NEGATIVE mg/dL
Ketones, ur: NEGATIVE mg/dL
Nitrite: NEGATIVE
Protein, ur: NEGATIVE mg/dL
Specific Gravity, Urine: 1.035 — ABNORMAL HIGH (ref 1.005–1.030)
pH: 7 (ref 5.0–8.0)

## 2021-05-23 LAB — COMPREHENSIVE METABOLIC PANEL
ALT: 18 U/L (ref 0–44)
AST: 20 U/L (ref 15–41)
Albumin: 3.6 g/dL (ref 3.5–5.0)
Alkaline Phosphatase: 115 U/L (ref 38–126)
Anion gap: 10 (ref 5–15)
BUN: 16 mg/dL (ref 8–23)
CO2: 23 mmol/L (ref 22–32)
Calcium: 8.9 mg/dL (ref 8.9–10.3)
Chloride: 106 mmol/L (ref 98–111)
Creatinine, Ser: 1.12 mg/dL — ABNORMAL HIGH (ref 0.44–1.00)
GFR, Estimated: 52 mL/min — ABNORMAL LOW (ref >60)
Glucose, Bld: 138 mg/dL — ABNORMAL HIGH (ref 70–99)
Potassium: 3.7 mmol/L (ref 3.5–5.1)
Sodium: 139 mmol/L (ref 135–145)
Total Bilirubin: 0.7 mg/dL (ref 0.3–1.2)
Total Protein: 6.9 g/dL (ref 6.5–8.1)

## 2021-05-23 LAB — I-STAT CHEM 8, ED
BUN: 15 mg/dL (ref 8–23)
Calcium, Ion: 1.06 mmol/L — ABNORMAL LOW (ref 1.15–1.40)
Chloride: 106 mmol/L (ref 98–111)
Creatinine, Ser: 1.1 mg/dL — ABNORMAL HIGH (ref 0.44–1.00)
Glucose, Bld: 134 mg/dL — ABNORMAL HIGH (ref 70–99)
HCT: 31 % — ABNORMAL LOW (ref 36.0–46.0)
Hemoglobin: 10.5 g/dL — ABNORMAL LOW (ref 12.0–15.0)
Potassium: 3.7 mmol/L (ref 3.5–5.1)
Sodium: 139 mmol/L (ref 135–145)
TCO2: 23 mmol/L (ref 22–32)

## 2021-05-23 LAB — DIFFERENTIAL
Abs Immature Granulocytes: 0.06 10*3/uL (ref 0.00–0.07)
Basophils Absolute: 0.1 10*3/uL (ref 0.0–0.1)
Basophils Relative: 1 %
Eosinophils Absolute: 0.1 10*3/uL (ref 0.0–0.5)
Eosinophils Relative: 1 %
Immature Granulocytes: 1 %
Lymphocytes Relative: 14 %
Lymphs Abs: 1.3 10*3/uL (ref 0.7–4.0)
Monocytes Absolute: 1.2 10*3/uL — ABNORMAL HIGH (ref 0.1–1.0)
Monocytes Relative: 14 %
Neutro Abs: 6.1 10*3/uL (ref 1.7–7.7)
Neutrophils Relative %: 69 %

## 2021-05-23 LAB — TROPONIN I (HIGH SENSITIVITY)
Troponin I (High Sensitivity): 22 ng/L — ABNORMAL HIGH (ref <18)
Troponin I (High Sensitivity): 9 ng/L (ref <18)

## 2021-05-23 LAB — PROTIME-INR
INR: 1.1 (ref 0.8–1.2)
Prothrombin Time: 14 seconds (ref 11.4–15.2)

## 2021-05-23 LAB — APTT: aPTT: 30 seconds (ref 24–36)

## 2021-05-23 LAB — CBC
HCT: 35.1 % — ABNORMAL LOW (ref 36.0–46.0)
Hemoglobin: 10.6 g/dL — ABNORMAL LOW (ref 12.0–15.0)
MCH: 24.5 pg — ABNORMAL LOW (ref 26.0–34.0)
MCHC: 30.2 g/dL (ref 30.0–36.0)
MCV: 81.1 fL (ref 80.0–100.0)
Platelets: 185 10*3/uL (ref 150–400)
RBC: 4.33 MIL/uL (ref 3.87–5.11)
RDW: 18.8 % — ABNORMAL HIGH (ref 11.5–15.5)
WBC: 8.7 10*3/uL (ref 4.0–10.5)
nRBC: 0 % (ref 0.0–0.2)

## 2021-05-23 LAB — CBG MONITORING, ED: Glucose-Capillary: 131 mg/dL — ABNORMAL HIGH (ref 70–99)

## 2021-05-23 MED ORDER — SODIUM CHLORIDE 0.9% FLUSH
3.0000 mL | Freq: Once | INTRAVENOUS | Status: DC
Start: 2021-05-23 — End: 2021-05-30

## 2021-05-23 MED ORDER — SODIUM CHLORIDE 0.9 % IV BOLUS
1000.0000 mL | Freq: Once | INTRAVENOUS | Status: AC
  Administered 2021-05-23: 1000 mL via INTRAVENOUS

## 2021-05-23 MED ORDER — HYDRALAZINE HCL 25 MG PO TABS
25.0000 mg | ORAL_TABLET | Freq: Once | ORAL | Status: AC
  Administered 2021-05-23: 25 mg via ORAL
  Filled 2021-05-23: qty 1

## 2021-05-23 MED ORDER — IOHEXOL 350 MG/ML SOLN
100.0000 mL | Freq: Once | INTRAVENOUS | Status: AC | PRN
  Administered 2021-05-23: 100 mL via INTRAVENOUS

## 2021-05-23 NOTE — ED Provider Notes (Signed)
[facility] EMERGENCY DEPARTMENT Provider Note   CSN: [id number] Arrival date & time: 05/23/21  3500  An emergency department physician performed an initial assessment on this suspected stroke patient at 74.  History Chief Complaint  Patient presents with   Code Stroke    Increasing right side weakness onset 1630 with questionable left side facial droop onset 1830.  H/o CVA with right side deficit and breast CA    [name] [name] is a 75 y.o. female.  History of breast cancer, CVA with residual right-sided weakness.  History limited due to acuity.  Presented as a stroke alert with concern for right-sided weakness and left facial droop.  Patient reports that she was last up and walking unwell around 4:30 PM.  Around 6:30 PM she tried to get up to walk but was unable to walk.  Normally able to walk without any difficulty felt more weak on her right side and is normal.  EMS thought they noted a slight left facial droop as well.  Patient denies any confusion, no speech change or vision change.  Walks independently or with walker at home.  Additional history obtained from chart review.  HPI     Past Medical History:  Diagnosis Date   Anxiety    Cancer [facility])    breast cancer- right   Carotid stenosis    Depression    situational   Dyspnea    with much ambulation   GERD (gastroesophageal reflux disease)    Hypertension    Stroke ([place]) 04/01/2017   a little weakness right side leg and hand, a little memry loss    Patient Active Problem List   Diagnosis Date Noted   Goals of care, counseling/discussion 05/12/2019   Pain due to onychomycosis of toenails of both feet 05/07/2019   [place] durum 05/07/2019   Coagulation disorder ([place]) 05/07/2019   Multiple lung nodules 01/11/2019   Port-A-Cath in place 10/09/2018   Inflammatory breast cancer, 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   Malignant neoplasm of overlapping sites of right breast in female, estrogen receptor positive  ([place]) 08/24/2018   Glucose intolerance (impaired glucose tolerance) 05/01/2017   Depression 05/01/2017   Hypertensive crisis    Acute bilat watershed infarction [facility]) 04/09/2017   Intracranial vascular stenosis    Hemiparesis affecting dominant side as late effect of stroke (HCC)    Post-operative pain    S/P carotid endarterectomy    Essential hypertension    Stenosis of left carotid artery    Hyperlipidemia    Gait disturbance, post-stroke 04/02/2017   Right arm weakness    Slowness and poor responsiveness    Hypertensive emergency 04/01/2017   Hypokalemia 04/01/2017   Hyperglycemia 04/01/2017   Stroke-like symptoms 04/01/2017   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 ([place]) 04/01/2017    Past Surgical History:  Procedure Laterality Date   ABDOMINAL HYSTERECTOMY     ENDARTERECTOMY Left 04/08/2017   Procedure: ENDARTERECTOMY CAROTID;  Surgeon: Waynetta Sandy, MD;  Location: [facility];  Service: Vascular;  Laterality: Left;   PATCH ANGIOPLASTY Left 04/08/2017   Procedure: PATCH ANGIOPLASTY Left Carotid;  Surgeon: Waynetta Sandy, MD;  Location: [facility];  Service: Vascular;  Laterality: Left;   PORTACATH PLACEMENT Left 09/07/2018   Procedure: INSERTION PORT-A-CATH LEFT INTERNAL JUGULAR;  Surgeon: [name], MD;  Location: [facility];  Service: General;  Laterality: Left;     OB History   No obstetric history on file.     Family  History  Problem Relation Age of Onset   Cancer Mother        ovarian    Social History   Tobacco Use   Smoking status: Never   Smokeless tobacco: Never  Vaping Use   Vaping Use: Never used  Substance Use Topics   Alcohol use: No   Drug use: No    Home Medications Prior to Admission medications   Medication Sig Start Date End Date Taking? Authorizing Provider  amLODipine (NORVASC) 10 MG tablet TAKE 1 TABLET BY MOUTH ONCE DAILY 03/09/20 06/07/21  [name], MD  atorvastatin (LIPITOR) 80 MG  tablet TAKE 1 TABLET BY MOUTH ONCE DAILY 12/13/20 12/13/21  [name], MD  capecitabine (XELODA) 500 MG tablet Take 2 tablets (1,000 mg total) by mouth daily. Take within 30 minutes after breakfast. 05/02/21   Magrinat, [name], MD  clopidogrel (PLAVIX) 75 MG tablet Take 1 tablet (75 mg total) by mouth daily. 04/24/21     latanoprost (XALATAN) 0.005 % ophthalmic solution PLACE 1 DROP IN BOTH EYES EVERY EVENING 01/15/21 01/15/22  Bettina Gavia, OD  lisinopril-hydrochlorothiazide (ZESTORETIC) 20-12.5 MG tablet Take 1 tablet by mouth daily 04/24/21     loratadine (CLARITIN) 10 MG tablet Take 10 mg by mouth daily as needed for allergies.    [provider]  potassium chloride SA (K-DUR,KLOR-CON) 20 MEQ tablet Take 1 tablet (20 mEq total) by mouth daily. 11/03/18   Tanner, Lyndon Code., PA-C  pregabalin (LYRICA) 25 MG capsule Take 1 capsule (25 mg total) by mouth 2 (two) times daily as needed for pain 04/24/21     traMADol (ULTRAM) 50 MG tablet Take 1-2 tablets (50-100 mg total) by mouth every 6 (six) hours as needed. 05/01/21   Magrinat, [name], MD  prochlorperazine (COMPAZINE) 10 MG tablet Take 1 tablet (10 mg total) by mouth 3 (three) times daily before meals. 08/18/19 11/10/19  Magrinat, [name], MD    Allergies    Patient has no known allergies.  Review of Systems   Review of Systems  Constitutional:  Negative for chills and fever.  HENT:  Negative for ear pain and sore throat.   Eyes:  Negative for pain and visual disturbance.  Respiratory:  Negative for cough and shortness of breath.   Cardiovascular:  Negative for chest pain and palpitations.  Gastrointestinal:  Negative for abdominal pain and vomiting.  Genitourinary:  Negative for dysuria and hematuria.  Musculoskeletal:  Negative for arthralgias and back pain.  Skin:  Negative for color change and rash.  Neurological:  Positive for facial asymmetry and weakness. Negative for seizures and syncope.  All other systems reviewed and are  negative.  Physical Exam Updated Vital Signs BP (!) 158/86   Pulse (!) 110   Temp 98 F (36.7 C)   Resp 19   Wt 102.4 kg   SpO2 100%   BMI 36.44 kg/m   Physical Exam Vitals and nursing note reviewed.  Constitutional:      General: She is not in acute distress.    Appearance: She is well-developed.  HENT:     Head: Normocephalic and atraumatic.  Eyes:     Conjunctiva/sclera: Conjunctivae normal.  Cardiovascular:     Rate and Rhythm: Normal rate and regular rhythm.     Heart sounds: No murmur heard. Pulmonary:     Effort: Pulmonary effort is normal. No respiratory distress.     Breath sounds: Normal breath sounds.  Abdominal:     Palpations: Abdomen is soft.  Tenderness: There is no abdominal tenderness.  Musculoskeletal:     Cervical back: Neck supple.  Skin:    General: Skin is warm and dry.  Neurological:     Mental Status: She is alert.     Comments: AAOx3 CN 2-12 intact, speech clear visual fields intact 5/5 strength in LLE and LUE 5/5 strength in RUE 3-4/5 strength in RLE Sensation to light touch intact in b/l UE and LE Normal FNF Normal gait    ED Results / Procedures / Treatments   Labs (all labs ordered are listed, but only abnormal results are displayed) Labs Reviewed  CBC - Abnormal; Notable for the following components:      Result Value   Hemoglobin 10.6 (*)    HCT 35.1 (*)    MCH 24.5 (*)    RDW 18.8 (*)    All other components within normal limits  DIFFERENTIAL - Abnormal; Notable for the following components:   Monocytes Absolute 1.2 (*)    All other components within normal limits  COMPREHENSIVE METABOLIC PANEL - Abnormal; Notable for the following components:   Glucose, Bld 138 (*)    Creatinine, Ser 1.12 (*)    GFR, Estimated 52 (*)    All other components within normal limits  URINALYSIS, ROUTINE W REFLEX MICROSCOPIC - Abnormal; Notable for the following components:   Color, Urine STRAW (*)    Specific Gravity, Urine 1.035 (*)     Hgb urine dipstick [place] (*)    Leukocytes,Ua TRACE (*)    All other components within normal limits  I-STAT CHEM 8, ED - Abnormal; Notable for the following components:   Creatinine, Ser 1.10 (*)    Glucose, Bld 134 (*)    Calcium, Ion 1.06 (*)    Hemoglobin 10.5 (*)    HCT 31.0 (*)    All other components within normal limits  CBG MONITORING, ED - Abnormal; Notable for the following components:   Glucose-Capillary 131 (*)    All other components within normal limits  TROPONIN I (HIGH SENSITIVITY) - Abnormal; Notable for the following components:   Troponin I (High Sensitivity) 22 (*)    All other components within normal limits  RESP PANEL BY RT-PCR (FLU A&B, COVID) ARPGX2  PROTIME-INR  APTT  TROPONIN I (HIGH SENSITIVITY)    EKG EKG Interpretation  Date/Time:  Wednesday May 23 2021 22:36:43 EDT Ventricular Rate:  111 PR Interval:  156 QRS Duration: 100 QT Interval:  326 QTC Calculation: 443 R Axis:   5 Text Interpretation: Sinus tachycardia Confirmed by Ripley Fraise [phone]) on 05/23/2021 11:13:27 PM  Radiology CT Angio Head W or Wo Contrast  Result Date: 05/23/2021 CLINICAL DATA:  Initial evaluation for acute stroke, possible right-sided weakness. EXAM: CT ANGIOGRAPHY HEAD AND NECK CT PERFUSION BRAIN TECHNIQUE: Multidetector CT imaging of the head and neck was performed using the standard protocol during bolus administration of intravenous contrast. Multiplanar CT image reconstructions and MIPs were obtained to evaluate the vascular anatomy. Carotid stenosis measurements (when applicable) are obtained utilizing NASCET criteria, using the distal internal carotid diameter as the denominator. Multiphase CT imaging of the brain was performed following IV bolus contrast injection. Subsequent parametric perfusion maps were calculated using RAPID software. CONTRAST:  127mL OMNIPAQUE IOHEXOL 350 MG/ML SOLN COMPARISON:  Prior head CT from earlier the same day. FINDINGS: CTA NECK  FINDINGS Aortic arch: Visualized aortic arch normal caliber with normal branch pattern. Atheromatous change about the arch and origin of the great vessels without hemodynamically significant  stenosis. Right carotid system: Right CCA patent from its origin to the bifurcation without stenosis. Scattered mixed plaque about the right carotid bulb/proximal right ICA without significant stenosis. Right ICA patent distally without stenosis, dissection or occlusion. Left carotid system: Left CCA patent from its origin to the bifurcation without stenosis. Scattered mixed plaque about the left carotid bulb/proximal left ICA without stenosis. Left ICA patent distally without stenosis, dissection or occlusion. Vertebral arteries: Both vertebral arteries arise from the subclavian arteries. No proximal subclavian artery stenosis. Atheromatous irregularity with multifocal mild-to-moderate stenoses noted throughout the vertebral arteries bilaterally. Vertebral arteries remain patent within the neck without occlusion or other acute vascular abnormality. Skeleton: No visible acute osseous finding. No discrete or worrisome osseous lesions. Other neck: No other acute soft tissue abnormality within the neck. No discrete mass or adenopathy. Upper chest: Infiltrating tumor involving the left breast and upper right chest wall with overlying skin thickening partially visualized. Visualized upper chest demonstrates no other acute finding. Review of the MIP images confirms the above findings CTA HEAD FINDINGS Anterior circulation: Petrous segments patent bilaterally. Atheromatous change throughout the carotid siphons with associated moderate to severe multifocal stenoses. Left A1 segment patent. Right A1 hypoplastic and/or absent, accounting for the diminutive right ICA is compared to the left. Normal anterior communicating artery complex. Atheromatous change throughout the A2/A3 segments with associated moderate multifocal stenoses, worse on  the right. M1 segments mildly irregular but patent without high-grade stenosis. Normal MCA bifurcations. Distal MCA branches well perfused and symmetric. Diffuse Corman vessel atheromatous irregularity. Posterior circulation: Atheromatous change about the proximal V4 segments with associated moderate stenosis on the right and more severe focal stenosis on the left (series 4, image 138). Basilar diminutive and irregular but patent to its distal aspect without stenosis. Superior cerebral arteries grossly patent at their origins. Both PCAs primarily supplied via the basilar. Atheromatous irregularity throughout the PCAs bilaterally. Short-segment proximal moderate right P2 stenosis (series 5, image 90). PCAs otherwise patent to their distal aspects without high-grade stenosis. Venous sinuses: Grossly patent allowing for timing the contrast bolus. Anatomic variants: Hypoplastic/absent right A1. No visible aneurysm. Review of the MIP images confirms the above findings CT Brain Perfusion Findings: ASPECTS: 10 CBF (<30%) Volume: 72mL Perfusion (Tmax>6.0s) volume: 5mL Mismatch Volume: 19mL Infarction Location:Negative CT perfusion with no evidence for acute core infarct or other perfusion deficit. IMPRESSION: 1. Negative CTA for emergent large vessel occlusion. 2. Negative CT perfusion with no evidence for acute core infarct or other perfusion deficit. 3. Extensive atherosclerotic change throughout the intracranial circulation. Associated moderate to severe stenoses throughout the carotid siphons, with moderate to severe bilateral proximal V4 stenoses. 4. Additional scattered atherosclerotic change elsewhere about the major arterial vasculature of the head and neck as above. No other hemodynamically significant or correctable stenosis. 5. 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 These results were communicated to Dr. Cheral Marker at 8:09 pm on 05/23/2021 by text page via the  [facility] messaging system. Electronically Signed   By: Jeannine Boga M.D.   On: 05/23/2021 20:39   CT Angio Neck W and/or Wo Contrast  Result Date: 05/23/2021 CLINICAL DATA:  Initial evaluation for acute stroke, possible right-sided weakness. EXAM: CT ANGIOGRAPHY HEAD AND NECK CT PERFUSION BRAIN TECHNIQUE: Multidetector CT imaging of the head and neck was performed using the standard protocol during bolus administration of intravenous contrast. Multiplanar CT image reconstructions and MIPs were obtained to evaluate the vascular anatomy. Carotid stenosis measurements (when applicable) are  obtained utilizing NASCET criteria, using the distal internal carotid diameter as the denominator. Multiphase CT imaging of the brain was performed following IV bolus contrast injection. Subsequent parametric perfusion maps were calculated using RAPID software. CONTRAST:  188mL OMNIPAQUE IOHEXOL 350 MG/ML SOLN COMPARISON:  Prior head CT from earlier the same day. FINDINGS: CTA NECK FINDINGS Aortic arch: Visualized aortic arch normal caliber with normal branch pattern. Atheromatous change about the arch and origin of the great vessels without hemodynamically significant stenosis. Right carotid system: Right CCA patent from its origin to the bifurcation without stenosis. Scattered mixed plaque about the right carotid bulb/proximal right ICA without significant stenosis. Right ICA patent distally without stenosis, dissection or occlusion. Left carotid system: Left CCA patent from its origin to the bifurcation without stenosis. Scattered mixed plaque about the left carotid bulb/proximal left ICA without stenosis. Left ICA patent distally without stenosis, dissection or occlusion. Vertebral arteries: Both vertebral arteries arise from the subclavian arteries. No proximal subclavian artery stenosis. Atheromatous irregularity with multifocal mild-to-moderate stenoses noted throughout the vertebral arteries bilaterally. Vertebral  arteries remain patent within the neck without occlusion or other acute vascular abnormality. Skeleton: No visible acute osseous finding. No discrete or worrisome osseous lesions. Other neck: No other acute soft tissue abnormality within the neck. No discrete mass or adenopathy. Upper chest: Infiltrating tumor involving the left breast and upper right chest wall with overlying skin thickening partially visualized. Visualized upper chest demonstrates no other acute finding. Review of the MIP images confirms the above findings CTA HEAD FINDINGS Anterior circulation: Petrous segments patent bilaterally. Atheromatous change throughout the carotid siphons with associated moderate to severe multifocal stenoses. Left A1 segment patent. Right A1 hypoplastic and/or absent, accounting for the diminutive right ICA is compared to the left. Normal anterior communicating artery complex. Atheromatous change throughout the A2/A3 segments with associated moderate multifocal stenoses, worse on the right. M1 segments mildly irregular but patent without high-grade stenosis. Normal MCA bifurcations. Distal MCA branches well perfused and symmetric. Diffuse Mckee vessel atheromatous irregularity. Posterior circulation: Atheromatous change about the proximal V4 segments with associated moderate stenosis on the right and more severe focal stenosis on the left (series 4, image 138). Basilar diminutive and irregular but patent to its distal aspect without stenosis. Superior cerebral arteries grossly patent at their origins. Both PCAs primarily supplied via the basilar. Atheromatous irregularity throughout the PCAs bilaterally. Short-segment proximal moderate right P2 stenosis (series 5, image 90). PCAs otherwise patent to their distal aspects without high-grade stenosis. Venous sinuses: Grossly patent allowing for timing the contrast bolus. Anatomic variants: Hypoplastic/absent right A1. No visible aneurysm. Review of the MIP images confirms  the above findings CT Brain Perfusion Findings: ASPECTS: 10 CBF (<30%) Volume: 85mL Perfusion (Tmax>6.0s) volume: 84mL Mismatch Volume: 63mL Infarction Location:Negative CT perfusion with no evidence for acute core infarct or other perfusion deficit. IMPRESSION: 1. Negative CTA for emergent large vessel occlusion. 2. Negative CT perfusion with no evidence for acute core infarct or other perfusion deficit. 3. Extensive atherosclerotic change throughout the intracranial circulation. Associated moderate to severe stenoses throughout the carotid siphons, with moderate to severe bilateral proximal V4 stenoses. 4. Additional scattered atherosclerotic change elsewhere about the major arterial vasculature of the head and neck as above. No other hemodynamically significant or correctable stenosis. 5. 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 These results were communicated to Dr. Cheral Marker at 8:09 pm on 05/23/2021 by text page via the [facility] messaging system. Electronically Signed   By:  Jeannine Boga M.D.   On: 05/23/2021 20:39   CT CEREBRAL PERFUSION W CONTRAST  Result Date: 05/23/2021 CLINICAL DATA:  Initial evaluation for acute stroke, possible right-sided weakness. EXAM: CT ANGIOGRAPHY HEAD AND NECK CT PERFUSION BRAIN TECHNIQUE: Multidetector CT imaging of the head and neck was performed using the standard protocol during bolus administration of intravenous contrast. Multiplanar CT image reconstructions and MIPs were obtained to evaluate the vascular anatomy. Carotid stenosis measurements (when applicable) are obtained utilizing NASCET criteria, using the distal internal carotid diameter as the denominator. Multiphase CT imaging of the brain was performed following IV bolus contrast injection. Subsequent parametric perfusion maps were calculated using RAPID software. CONTRAST:  162mL OMNIPAQUE IOHEXOL 350 MG/ML SOLN COMPARISON:  Prior head CT from earlier the same  day. FINDINGS: CTA NECK FINDINGS Aortic arch: Visualized aortic arch normal caliber with normal branch pattern. Atheromatous change about the arch and origin of the great vessels without hemodynamically significant stenosis. Right carotid system: Right CCA patent from its origin to the bifurcation without stenosis. Scattered mixed plaque about the right carotid bulb/proximal right ICA without significant stenosis. Right ICA patent distally without stenosis, dissection or occlusion. Left carotid system: Left CCA patent from its origin to the bifurcation without stenosis. Scattered mixed plaque about the left carotid bulb/proximal left ICA without stenosis. Left ICA patent distally without stenosis, dissection or occlusion. Vertebral arteries: Both vertebral arteries arise from the subclavian arteries. No proximal subclavian artery stenosis. Atheromatous irregularity with multifocal mild-to-moderate stenoses noted throughout the vertebral arteries bilaterally. Vertebral arteries remain patent within the neck without occlusion or other acute vascular abnormality. Skeleton: No visible acute osseous finding. No discrete or worrisome osseous lesions. Other neck: No other acute soft tissue abnormality within the neck. No discrete mass or adenopathy. Upper chest: Infiltrating tumor involving the left breast and upper right chest wall with overlying skin thickening partially visualized. Visualized upper chest demonstrates no other acute finding. Review of the MIP images confirms the above findings CTA HEAD FINDINGS Anterior circulation: Petrous segments patent bilaterally. Atheromatous change throughout the carotid siphons with associated moderate to severe multifocal stenoses. Left A1 segment patent. Right A1 hypoplastic and/or absent, accounting for the diminutive right ICA is compared to the left. Normal anterior communicating artery complex. Atheromatous change throughout the A2/A3 segments with associated moderate  multifocal stenoses, worse on the right. M1 segments mildly irregular but patent without high-grade stenosis. Normal MCA bifurcations. Distal MCA branches well perfused and symmetric. Diffuse [place] vessel atheromatous irregularity. Posterior circulation: Atheromatous change about the proximal V4 segments with associated moderate stenosis on the right and more severe focal stenosis on the left (series 4, image 138). Basilar diminutive and irregular but patent to its distal aspect without stenosis. Superior cerebral arteries grossly patent at their origins. Both PCAs primarily supplied via the basilar. Atheromatous irregularity throughout the PCAs bilaterally. Short-segment proximal moderate right P2 stenosis (series 5, image 90). PCAs otherwise patent to their distal aspects without high-grade stenosis. Venous sinuses: Grossly patent allowing for timing the contrast bolus. Anatomic variants: Hypoplastic/absent right A1. No visible aneurysm. Review of the MIP images confirms the above findings CT Brain Perfusion Findings: ASPECTS: 10 CBF (<30%) Volume: 36mL Perfusion (Tmax>6.0s) volume: 5mL Mismatch Volume: 70mL Infarction Location:Negative CT perfusion with no evidence for acute core infarct or other perfusion deficit. IMPRESSION: 1. Negative CTA for emergent large vessel occlusion. 2. Negative CT perfusion with no evidence for acute core infarct or other perfusion deficit. 3. Extensive atherosclerotic change throughout the intracranial circulation. Associated  moderate to severe stenoses throughout the carotid siphons, with moderate to severe bilateral proximal V4 stenoses. 4. Additional scattered atherosclerotic change elsewhere about the major arterial vasculature of the head and neck as above. No other hemodynamically significant or correctable stenosis. 5. 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 These results were communicated to Dr. Cheral Marker at 8:09 pm on  05/23/2021 by text page via the [facility] messaging system. Electronically Signed   By: Jeannine Boga M.D.   On: 05/23/2021 20:39   DG Chest Portable 1 View  Result Date: 05/23/2021 CLINICAL DATA:  75 year old female with weakness. EXAM: PORTABLE CHEST 1 VIEW COMPARISON:  Chest radiograph dated 09/07/2018. FINDINGS: Left-sided Port-A-Cath with tip at the cavoatrial junction. Mild diffuse chronic interstitial coarsening. No focal consolidations crash that there is a 2.8 x 2.0 cm indeterminate lobulated pleural base consolidation along the lateral right upper lobe not seen on the prior radiograph or CT. Further evaluation with chest CT is recommended. There is no pleural effusion pneumothorax. The cardiac silhouette is within limits. Atherosclerotic calcification of the aorta. No acute osseous pathology. IMPRESSION: Lobulated pleural base consolidation/nodule along the lateral right upper lobe. Further evaluation with chest CT is recommended. Electronically Signed   By: Anner Crete M.D.   On: 05/23/2021 21:43   CT HEAD CODE STROKE WO CONTRAST  Result Date: 05/23/2021 CLINICAL DATA:  Code stroke. Stroke. New onset of right-sided weakness. EXAM: CT HEAD WITHOUT CONTRAST TECHNIQUE: Contiguous axial images were obtained from the base of the skull through the vertex without intravenous contrast. COMPARISON:  CT head without contrast 04/04/2017 FINDINGS: Brain: A remote inferior right occipital lobe infarct is new since the prior study, but not acute. A left parietal lobe cortical infarct demonstrates volume loss and also appears remote. Remote white matter infarct of the genu of the right internal capsule again noted. Remote infarct of the posterior left frontal lobe appeared more acute on the prior exam. No acute cortical infarcts are present. No hemorrhage or mass lesion is present. Progressive moderate periventricular white matter disease is present. No acute hemorrhage or mass lesion is present. Remote  lacunar infarcts are present in the left cerebellum. The ventricles are proportionate to the degree of atrophy. No significant extraaxial fluid collection is present. Vascular: Atherosclerotic calcifications are present within the cavernous internal carotid arteries. No hyperdense vessel is present. Skull: Insert normal skull No significant extracranial soft tissue lesion is present. Sinuses/Orbits: The paranasal sinuses and mastoid air cells are clear. The globes and orbits are within normal limits. ASPECTS [facility] Stroke Program Early CT Score) - Ganglionic level infarction (caudate, lentiform nuclei, internal capsule, insula, M1-M3 cortex): 7/7 - Supraganglionic infarction (M4-M6 cortex): 3/3 Total score (0-10 with 10 being normal): 10/10 IMPRESSION: 1. No definite acute intracranial abnormality. 2. Left frontal lobe infarct appears remote with volume loss. There was evidence for acute infarct in this area on the prior study. 3. Multiple remote infarcts as described. 4. Progressive moderate atrophy and white matter disease. This likely reflects the sequela of chronic microvascular ischemia. 5. Aspects 10/10 The above was relayed via text pager to Dr. Cheral Marker on 05/23/2021 at 19:43 . Electronically Signed   By: San Morelle M.D.   On: 05/23/2021 19:44    Procedures Procedures   Medications Ordered in ED Medications  sodium chloride flush (NS) 0.9 % injection 3 mL (3 mLs Intravenous Not Given 05/23/21 2153)  iohexol (OMNIPAQUE) 350 MG/ML injection 100 mL (100 mLs Intravenous Contrast Given 05/23/21  2002)  sodium chloride 0.9 % bolus 1,000 mL (0 mLs Intravenous Stopped 05/23/21 2254)  hydrALAZINE (APRESOLINE) tablet 25 mg (25 mg Oral Given 05/23/21 2254)    ED Course  I have reviewed the triage vital signs and the nursing notes.  Pertinent labs & imaging results that were available during my care of the patient were reviewed by me and considered in my medical decision making (see chart for  details).    MDM Rules/Calculators/A&P                          75 year old lady presented to ER as a stroke alert with report of right-sided weakness and facial droop.  Taken emergently to [place] with neurology.  On her initial exam, she was noted to have mild right lower extremity weakness.  Did not appreciate any facial droop.  No other neurologic findings on exam.  Unclear exactly how much worse this mild right leg weakness is than normal.  Given patient seems to be most likely at her baseline level of weakness from past stroke and does not have any other new focal neurologic deficit or complaint, patient was determined to not be a tPA candidate.  Dr. Cheral Marker did recommend checking CT angio and CT perfusion studies.  This was negative for LVO, negative for acute infarct.  Her basic labs were stable.  UA negative.  Able to ambulate with walker in ER and patient felt like she was back to her baseline.  Discussed case again with Dr. Cheral Marker, he is going to reevaluate patient and provide further recommendations.  Noted mild tachycardia - will provide liter of fluids and further monitor.   While awaiting reassessment and further recommendations from neurology, signed out to Dr. Christy Gentles.  Please refer to his note for final plan and disposition.   Final Clinical Impression(s) / ED Diagnoses Final diagnoses:  Stroke-like symptoms    Rx / DC Orders ED Discharge Orders     None        Lucrezia Starch, MD 05/23/21 2335

## 2021-05-23 NOTE — ED Triage Notes (Signed)
Pt presents with increasing right leg weakness and inability to ambulate since 1630 with reported left side facial droop at 1830 that is not appreciated at this time.  No other deficits reported

## 2021-05-23 NOTE — ED Notes (Signed)
Pt ambulated from bed to the hall with a walker. The patient states that she has a walker at home but does not use it to get around her home. Pt was assisted by two techs and was able to walk with the walker, starting with her strong foot (left) and moving the right foot. Pt did not lose her balance and not much assistance was needed when walking beside the pt. Pt is back in bed,  Elmyra Ricks, RN and Roslynn Amble, MD aware of this progress.

## 2021-05-23 NOTE — Consult Note (Signed)
NEURO HOSPITALIST CONSULT NOTE   Requestig physician: Dr. Roslynn Amble  Reason for Consult: Acute onset of worsened RLE weakness  History obtained from:  Patient, Chart and EMS  HPI:                                                                                                                                          [name] [name] is an 75 y.o. female with a PMHx of anxiety, metastatic breast cancer, carotid stenosis s/p left CEA, dyspnea with ambulatoin, HTN and stroke (2018, with residual right sided weakness)who presents acutely from home with increasing RLE weakness and inability to ambulate since 4:30 PM, in conjunction with new onset of left facial droop which began at 1830. EMS was called and Code Stroke was called in the field. On arrival to the ED, RLE weakness was noted, but no facial droop was seen. The patient complained of acute onset of global/diffuse weakness at the time of perceived onset of the worsened RLE weakness. It was uncertain if the new symptoms were due to an acute stroke or due to worsening of baseline weakness from her prior stroke in the context of new diffuse weakness. At baseline, the patient does not need assistance to ambulate, but does have a walker available in her house to use if needed.   CT head: 1. No definite acute intracranial abnormality. 2. Left frontal lobe infarct appears remote with volume loss. There was evidence for acute infarct in this area on the prior study. 3. Multiple remote infarcts as described. 4. Progressive moderate atrophy and white matter disease. This likely reflects the sequela of chronic microvascular ischemia. 5. Aspects 10/10  CTA of head and neck with CTP: 1. Negative CTA for emergent large vessel occlusion. 2. Negative CT perfusion with no evidence for acute core infarct or other perfusion deficit. 3. Extensive atherosclerotic change throughout the intracranial circulation. Associated moderate to severe  stenoses throughout the carotid siphons, with moderate to severe bilateral proximal V4 stenoses. 4. Additional scattered atherosclerotic change elsewhere about the major arterial vasculature of the head and neck as above. No other hemodynamically significant or correctable stenosis. 5. 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  Past Medical History:  Diagnosis Date   Anxiety    Cancer [facility])    breast cancer- right   Carotid stenosis    Depression    situational   Dyspnea    with much ambulation   GERD (gastroesophageal reflux disease)    Hypertension    Stroke ([place]) 04/01/2017   a little weakness right side leg and hand, a little memry loss    Past Surgical History:  Procedure Laterality Date   ABDOMINAL HYSTERECTOMY     ENDARTERECTOMY Left 04/08/2017   Procedure: ENDARTERECTOMY  CAROTID;  Surgeon: Waynetta Sandy, MD;  Location: [place];  Service: Vascular;  Laterality: Left;   PATCH ANGIOPLASTY Left 04/08/2017   Procedure: PATCH ANGIOPLASTY Left Carotid;  Surgeon: Waynetta Sandy, MD;  Location: [place];  Service: Vascular;  Laterality: Left;   PORTACATH PLACEMENT Left 09/07/2018   Procedure: INSERTION PORT-A-CATH LEFT INTERNAL JUGULAR;  Surgeon: [name], MD;  Location: [facility] OR;  Service: General;  Laterality: Left;    Family History  Problem Relation Age of Onset   Cancer Mother        ovarian            Social History:  reports that she has never smoked. She has never used smokeless tobacco. She reports that she does not drink alcohol and does not use drugs.  No Known Allergies  MEDICATIONS:                                                                                                                     No current facility-administered medications on file prior to encounter.   Current Outpatient Medications on File Prior to Encounter  Medication Sig Dispense Refill   amLODipine (NORVASC) 10  MG tablet TAKE 1 TABLET BY MOUTH ONCE DAILY 90 tablet 3   atorvastatin (LIPITOR) 80 MG tablet TAKE 1 TABLET BY MOUTH ONCE DAILY (Patient taking differently: Take 80 mg by mouth every evening.) 90 tablet 0   capecitabine (XELODA) 500 MG tablet Take 2 tablets (1,000 mg total) by mouth daily. Take within 30 minutes after breakfast. 60 tablet 6   clopidogrel (PLAVIX) 75 MG tablet Take 1 tablet (75 mg total) by mouth daily. 90 tablet 3   latanoprost (XALATAN) 0.005 % ophthalmic solution PLACE 1 DROP IN BOTH EYES EVERY EVENING 7.5 mL 3   lisinopril-hydrochlorothiazide (ZESTORETIC) 20-12.5 MG tablet Take 1 tablet by mouth daily 90 tablet 3   loratadine (CLARITIN) 10 MG tablet Take 10 mg by mouth daily as needed for allergies.     potassium chloride SA (K-DUR,KLOR-CON) 20 MEQ tablet Take 1 tablet (20 mEq total) by mouth daily. (Patient not taking: Reported on 05/24/2021) 30 tablet 0   pregabalin (LYRICA) 25 MG capsule Take 1 capsule (25 mg total) by mouth 2 (two) times daily as needed for pain (Patient not taking: Reported on 05/24/2021) 60 capsule 3   traMADol (ULTRAM) 50 MG tablet Take 1-2 tablets (50-100 mg total) by mouth every 6 (six) hours as needed. (Patient not taking: Reported on 05/24/2021) 60 tablet 0   [DISCONTINUED] prochlorperazine (COMPAZINE) 10 MG tablet Take 1 tablet (10 mg total) by mouth 3 (three) times daily before meals. 30 tablet 1     ROS:  As per HPI. Does not endorse any additional neurological complaints.    There were no vitals taken for this visit.   General Examination:                                                                                                       Physical Exam  HEENT-  [place]/AT    Lungs- Respirations unlabored Extremities- Bilateral pitting edema  Neurological Examination Mental Status: Alert, fully oriented, pleasant  and cooperative. Speech fluent with intact comprehension and naming. Repetition intact.  Cranial Nerves: II: Temporal visual fields intact with no extinction to DSS. PERRL.  III,IV, VI: EOMI. No nystagmus.  V: Temp sensation equal bilaterally  VII: No facial droop.  VIII: Hearing intact to voice IX,X: No hypophonia XI: Head is midline XII: Midline tongue extension Motor: RUE 4/5 RLE 3/5 hip flexion, 4-/5 knee extension, 4/5 ADF/APF LUE 4+/5 LLE 4-/5 hip flexion, 4/5 knee extension, ADF and APF  Elevates RUE less briskly than left when testing drift Sensory: Intact to FT and temp sensation x 4. No extinction to DSS.  Deep Tendon Reflexes: 1+ and symmetric throughout Plantars:  Right: downgoing   Left: downgoing Cerebellar: No ataxia with FNF bilaterally  Gait: Deferred   Lab Results: Basic Metabolic Panel: Recent Labs  Lab 05/23/21 1936  NA 139  K 3.7  CL 106  GLUCOSE 134*  BUN 15  CREATININE 1.10*    CBC: Recent Labs  Lab 05/23/21 1925 05/23/21 1936  WBC 8.7  --   NEUTROABS 6.1  --   HGB 10.6* 10.5*  HCT 35.1* 31.0*  MCV 81.1  --   PLT 185  --     Cardiac Enzymes: No results for input(s): CKTOTAL, CKMB, CKMBINDEX, TROPONINI in the last 168 hours.  Lipid Panel: No results for input(s): CHOL, TRIG, HDL, CHOLHDL, VLDL, LDLCALC in the last 168 hours.  Imaging: CT HEAD CODE STROKE WO CONTRAST  Result Date: 05/23/2021 CLINICAL DATA:  Code stroke. Stroke. New onset of right-sided weakness. EXAM: CT HEAD WITHOUT CONTRAST TECHNIQUE: Contiguous axial images were obtained from the base of the skull through the vertex without intravenous contrast. COMPARISON:  CT head without contrast 04/04/2017 FINDINGS: Brain: A remote inferior right occipital lobe infarct is new since the prior study, but not acute. A left parietal lobe cortical infarct demonstrates volume loss and also appears remote. Remote white matter infarct of the genu of the right internal capsule again noted.  Remote infarct of the posterior left frontal lobe appeared more acute on the prior exam. No acute cortical infarcts are present. No hemorrhage or mass lesion is present. Progressive moderate periventricular white matter disease is present. No acute hemorrhage or mass lesion is present. Remote lacunar infarcts are present in the left cerebellum. The ventricles are proportionate to the degree of atrophy. No significant extraaxial fluid collection is present. Vascular: Atherosclerotic calcifications are present within the cavernous internal carotid arteries. No hyperdense vessel is present. Skull: Insert normal skull No significant extracranial soft tissue lesion is present. Sinuses/Orbits: The paranasal sinuses and mastoid air cells are  clear. The globes and orbits are within normal limits. ASPECTS [facility] Stroke Program Early CT Score) - Ganglionic level infarction (caudate, lentiform nuclei, internal capsule, insula, M1-M3 cortex): 7/7 - Supraganglionic infarction (M4-M6 cortex): 3/3 Total score (0-10 with 10 being normal): 10/10 IMPRESSION: 1. No definite acute intracranial abnormality. 2. Left frontal lobe infarct appears remote with volume loss. There was evidence for acute infarct in this area on the prior study. 3. Multiple remote infarcts as described. 4. Progressive moderate atrophy and white matter disease. This likely reflects the sequela of chronic microvascular ischemia. 5. Aspects 10/10 The above was relayed via text pager to Dr. Cheral Marker on 05/23/2021 at 19:43 . Electronically Signed   By: San Morelle M.D.   On: 05/23/2021 19:44     Assessment: 75 year old female presenting via EMS from home with new onset of generalized weakness with associated symptoms of worsened right sided weakness in the same distribution as her chronic weakness from a prior stroke 1. Exam reveals diffuse weakness, worse on the right and lowers weaker than uppers.  2. CT head: No definite acute intracranial  abnormality. Left frontal lobe infarct appears remote with volume loss. There was evidence for acute infarct in this area on the prior study. Multiple remote infarcts as described. Progressive moderate atrophy and white matter disease. This likely reflects the sequela of chronic microvascular ischemia. Aspects 10/10 3. CTA of head and neck: Negative CTA for emergent large vessel occlusion. Extensive atherosclerotic change throughout the intracranial circulation. Associated moderate to severe stenoses throughout the carotid siphons, with moderate to severe bilateral proximal V4 stenoses. Additional scattered atherosclerotic change elsewhere about the major arterial vasculature of the head and neck as above.  4. CTP: Negative CT perfusion with no evidence for acute core infarct or other perfusion deficit. 5. Suspect that perceived worsening of right sided weakness is secondary the chronic left frontal lobe ischemic infarct seen on CT in conjunction with new onset of diffuse weakness. Diffuse weakness most likely multifactorial with fatigue/deconditioning in the setting of metastatic cancer likely playing a role. However, will need to rule out stroke with MRI.  6. Due to low likelihood of acute stroke, potential benefits of tPA are outweighed by risks.   Recommendations: 1. MRI brain.  2. Continue Plavix and atorvastatin 3. Further recommendations pending MRI.    Electronically signed: Dr. Kerney Elbe 05/23/2021, 7:58 PM

## 2021-05-23 NOTE — ED Notes (Signed)
From CT to exam 26.  Family present and updated per ED provider.

## 2021-05-24 ENCOUNTER — Encounter (HOSPITAL_COMMUNITY): Payer: Self-pay | Admitting: Family Medicine

## 2021-05-24 ENCOUNTER — Observation Stay (HOSPITAL_BASED_OUTPATIENT_CLINIC_OR_DEPARTMENT_OTHER): Payer: Medicare Other

## 2021-05-24 ENCOUNTER — Other Ambulatory Visit (HOSPITAL_COMMUNITY): Payer: Self-pay

## 2021-05-24 ENCOUNTER — Other Ambulatory Visit: Payer: Self-pay | Admitting: Oncology

## 2021-05-24 ENCOUNTER — Observation Stay (HOSPITAL_COMMUNITY): Payer: Medicare Other

## 2021-05-24 DIAGNOSIS — R531 Weakness: Secondary | ICD-10-CM | POA: Diagnosis not present

## 2021-05-24 DIAGNOSIS — I639 Cerebral infarction, unspecified: Secondary | ICD-10-CM | POA: Diagnosis not present

## 2021-05-24 DIAGNOSIS — N289 Disorder of kidney and ureter, unspecified: Secondary | ICD-10-CM | POA: Diagnosis not present

## 2021-05-24 DIAGNOSIS — U071 COVID-19: Secondary | ICD-10-CM | POA: Diagnosis not present

## 2021-05-24 DIAGNOSIS — C50811 Malignant neoplasm of overlapping sites of right female breast: Secondary | ICD-10-CM | POA: Diagnosis not present

## 2021-05-24 DIAGNOSIS — I10 Essential (primary) hypertension: Secondary | ICD-10-CM

## 2021-05-24 DIAGNOSIS — G9341 Metabolic encephalopathy: Secondary | ICD-10-CM | POA: Diagnosis not present

## 2021-05-24 DIAGNOSIS — A419 Sepsis, unspecified organism: Secondary | ICD-10-CM | POA: Diagnosis not present

## 2021-05-24 DIAGNOSIS — Z8673 Personal history of transient ischemic attack (TIA), and cerebral infarction without residual deficits: Secondary | ICD-10-CM | POA: Diagnosis not present

## 2021-05-24 DIAGNOSIS — C50911 Malignant neoplasm of unspecified site of right female breast: Secondary | ICD-10-CM

## 2021-05-24 DIAGNOSIS — Z170 Estrogen receptor positive status [ER+]: Secondary | ICD-10-CM | POA: Diagnosis not present

## 2021-05-24 DIAGNOSIS — N183 Chronic kidney disease, stage 3 unspecified: Secondary | ICD-10-CM | POA: Diagnosis present

## 2021-05-24 DIAGNOSIS — G319 Degenerative disease of nervous system, unspecified: Secondary | ICD-10-CM | POA: Diagnosis not present

## 2021-05-24 LAB — RESP PANEL BY RT-PCR (FLU A&B, COVID) ARPGX2
Influenza A by PCR: NEGATIVE
Influenza B by PCR: NEGATIVE
SARS Coronavirus 2 by RT PCR: POSITIVE — AB

## 2021-05-24 LAB — LIPID PANEL
Cholesterol: 127 mg/dL (ref 0–200)
HDL: 48 mg/dL (ref >40)
LDL Cholesterol: 73 mg/dL (ref 0–99)
Total CHOL/HDL Ratio: 2.6 RATIO
Triglycerides: 31 mg/dL (ref <150)
VLDL: 6 mg/dL (ref 0–40)

## 2021-05-24 LAB — HEMOGLOBIN A1C
Hgb A1c MFr Bld: 6.5 % — ABNORMAL HIGH (ref 4.8–5.6)
Mean Plasma Glucose: 139.85 mg/dL

## 2021-05-24 LAB — ECHOCARDIOGRAM COMPLETE
Area-P 1/2: 3.99 cm2
Weight: 3612.02 oz

## 2021-05-24 MED ORDER — SENNOSIDES-DOCUSATE SODIUM 8.6-50 MG PO TABS: 1.0000 | ORAL_TABLET | Freq: Every evening | ORAL | Status: DC | PRN

## 2021-05-24 MED ORDER — SODIUM CHLORIDE 0.9 % IV SOLN
200.0000 mg | Freq: Once | INTRAVENOUS | Status: AC
  Administered 2021-05-24: 200 mg via INTRAVENOUS
  Filled 2021-05-24: qty 40

## 2021-05-24 MED ORDER — STROKE: EARLY STAGES OF RECOVERY BOOK: Freq: Once | Status: DC

## 2021-05-24 MED ORDER — GADOBUTROL 1 MMOL/ML IV SOLN
10.0000 mL | Freq: Once | INTRAVENOUS | Status: AC | PRN
  Administered 2021-05-24: 10 mL via INTRAVENOUS

## 2021-05-24 MED ORDER — HYDRALAZINE HCL 20 MG/ML IJ SOLN: 5.0000 mg | Freq: Four times a day (QID) | INTRAMUSCULAR | Status: DC | PRN

## 2021-05-24 MED ORDER — SODIUM CHLORIDE 0.9 % IV SOLN
100.0000 mg | Freq: Every day | INTRAVENOUS | Status: DC
  Administered 2021-05-25 – 2021-05-26 (×2): 100 mg via INTRAVENOUS
  Filled 2021-05-24 (×2): qty 20

## 2021-05-24 MED ORDER — ACETAMINOPHEN 325 MG PO TABS
650.0000 mg | ORAL_TABLET | ORAL | Status: DC | PRN
  Administered 2021-05-24 – 2021-05-25 (×2): 650 mg via ORAL
  Filled 2021-05-24 (×3): qty 2

## 2021-05-24 MED ORDER — CHLORHEXIDINE GLUCONATE CLOTH 2 % EX PADS
6.0000 | MEDICATED_PAD | Freq: Every day | CUTANEOUS | Status: DC
  Administered 2021-05-25 – 2021-05-29 (×5): 6 via TOPICAL

## 2021-05-24 MED ORDER — CLOPIDOGREL BISULFATE 75 MG PO TABS
75.0000 mg | ORAL_TABLET | Freq: Every day | ORAL | Status: DC
  Administered 2021-05-24 – 2021-05-29 (×6): 75 mg via ORAL
  Filled 2021-05-24 (×6): qty 1

## 2021-05-24 MED ORDER — CAPECITABINE 500 MG PO TABS
1000.0000 mg | ORAL_TABLET | Freq: Every day | ORAL | Status: DC
  Filled 2021-05-24: qty 2

## 2021-05-24 MED ORDER — CAPECITABINE 500 MG PO TABS
1000.0000 mg | ORAL_TABLET | Freq: Every day | ORAL | Status: DC
  Administered 2021-05-24 – 2021-05-29 (×6): 1000 mg via ORAL
  Filled 2021-05-24 (×7): qty 2

## 2021-05-24 MED ORDER — AMLODIPINE BESYLATE 10 MG PO TABS
10.0000 mg | ORAL_TABLET | Freq: Every day | ORAL | Status: DC
  Administered 2021-05-24 – 2021-05-29 (×6): 10 mg via ORAL
  Filled 2021-05-24: qty 1
  Filled 2021-05-24: qty 2
  Filled 2021-05-24 (×4): qty 1

## 2021-05-24 MED ORDER — ACETAMINOPHEN 650 MG RE SUPP: 650.0000 mg | RECTAL | Status: DC | PRN

## 2021-05-24 MED ORDER — ENOXAPARIN SODIUM 40 MG/0.4ML IJ SOSY
40.0000 mg | PREFILLED_SYRINGE | INTRAMUSCULAR | Status: DC
  Administered 2021-05-24 – 2021-05-25 (×2): 40 mg via SUBCUTANEOUS
  Filled 2021-05-24 (×2): qty 0.4

## 2021-05-24 MED ORDER — ACETAMINOPHEN 160 MG/5ML PO SOLN: 650.0000 mg | ORAL | Status: DC | PRN

## 2021-05-24 MED ORDER — PERFLUTREN LIPID MICROSPHERE
1.0000 mL | INTRAVENOUS | Status: AC | PRN
  Administered 2021-05-24: 2 mL via INTRAVENOUS
  Filled 2021-05-24: qty 10

## 2021-05-24 MED ORDER — ATORVASTATIN CALCIUM 80 MG PO TABS
80.0000 mg | ORAL_TABLET | Freq: Every evening | ORAL | Status: DC
  Administered 2021-05-24 – 2021-05-29 (×6): 80 mg via ORAL
  Filled 2021-05-24: qty 2
  Filled 2021-05-24 (×5): qty 1

## 2021-05-24 NOTE — ED Notes (Signed)
Patient transported to MRI 

## 2021-05-24 NOTE — ED Notes (Signed)
PT at bedside.

## 2021-05-24 NOTE — Progress Notes (Signed)
2200- Patient arrived into 5W23 from ED. Pulled over into bed from stretcher. VSS. Denies any pain/discomfort. Full Skin assessment with second nurse performed. Admission completed.   Bed locked and in lowest position, non skid footwear in place, bed alarm on.

## 2021-05-24 NOTE — Progress Notes (Signed)
STROKE TEAM PROGRESS NOTE   SUBJECTIVE (INTERVAL HISTORY) Her daughter is at the bedside.  Overall her condition is rapidly improving. Pt lying in bed, eating cheese, she and her daughter recognized me as she last saw me in clinic in 01/2018. She smiled to me and stated "good to see you". She is in good spirit. She has been having fever this morning, Tmax 102.4 and now 100.6. she was found to have COVID +. On remdesivir. Still has mild right sided weakness, but at her baseline right residue from previous stroke. MRI pending. Pt is on chemotherapy for breat cancer with met.    OBJECTIVE Temp:  [98 F (36.7 C)-102.4 F (39.1 C)] 100.6 F (38.1 C) (07/14 1356) Pulse Rate:  [102-120] 109 (07/14 1356) Cardiac Rhythm: Sinus tachycardia (07/14 0726) Resp:  [12-29] 17 (07/14 1356) BP: (122-175)/(62-104) 160/76 (07/14 1356) SpO2:  [90 %-100 %] 97 % (07/14 1356) Weight:  [102.4 kg] 102.4 kg (07/13 1923)  Recent Labs  Lab 05/23/21 1926  GLUCAP 131*   Recent Labs  Lab 05/23/21 1925 05/23/21 1936  NA 139 139  K 3.7 3.7  CL 106 106  CO2 23  --   GLUCOSE 138* 134*  BUN 16 15  CREATININE 1.12* 1.10*  CALCIUM 8.9  --    Recent Labs  Lab 05/23/21 1925  AST 20  ALT 18  ALKPHOS 115  BILITOT 0.7  PROT 6.9  ALBUMIN 3.6   Recent Labs  Lab 05/23/21 1925 05/23/21 1936  WBC 8.7  --   NEUTROABS 6.1  --   HGB 10.6* 10.5*  HCT 35.1* 31.0*  MCV 81.1  --   PLT 185  --    No results for input(s): CKTOTAL, CKMB, CKMBINDEX, TROPONINI in the last 168 hours. Recent Labs    05/23/21 1925  LABPROT 14.0  INR 1.1   Recent Labs    05/23/21 2014  COLORURINE STRAW*  LABSPEC 1.035*  PHURINE 7.0  GLUCOSEU NEGATIVE  HGBUR Langsam*  BILIRUBINUR NEGATIVE  KETONESUR NEGATIVE  PROTEINUR NEGATIVE  NITRITE NEGATIVE  LEUKOCYTESUR TRACE*       Component Value Date/Time   CHOL 127 05/24/2021 0321   TRIG 31 05/24/2021 0321   HDL 48 05/24/2021 0321   CHOLHDL 2.6 05/24/2021 0321   VLDL 6  05/24/2021 0321   LDLCALC 73 05/24/2021 0321   Lab Results  Component Value Date   HGBA1C 6.5 (H) 05/24/2021      Component Value Date/Time   LABOPIA NONE DETECTED 04/01/2017 0920   COCAINSCRNUR NONE DETECTED 04/01/2017 0920   LABBENZ NONE DETECTED 04/01/2017 0920   AMPHETMU NONE DETECTED 04/01/2017 0920   THCU NONE DETECTED 04/01/2017 0920   LABBARB NONE DETECTED 04/01/2017 0920    No results for input(s): ETH in the last 168 hours.  I have personally reviewed the radiological images below and agree with the radiology interpretations.  CT Angio Head W or Wo Contrast  Result Date: 05/23/2021 CLINICAL DATA:  Initial evaluation for acute stroke, possible right-sided weakness. EXAM: CT ANGIOGRAPHY HEAD AND NECK CT PERFUSION BRAIN TECHNIQUE: Multidetector CT imaging of the head and neck was performed using the standard protocol during bolus administration of intravenous contrast. Multiplanar CT image reconstructions and MIPs were obtained to evaluate the vascular anatomy. Carotid stenosis measurements (when applicable) are obtained utilizing NASCET criteria, using the distal internal carotid diameter as the denominator. Multiphase CT imaging of the brain was performed following IV bolus contrast injection. Subsequent parametric perfusion maps were calculated using RAPID  software. CONTRAST:  113mL OMNIPAQUE IOHEXOL 350 MG/ML SOLN COMPARISON:  Prior head CT from earlier the same day. FINDINGS: CTA NECK FINDINGS Aortic arch: Visualized aortic arch normal caliber with normal branch pattern. Atheromatous change about the arch and origin of the great vessels without hemodynamically significant stenosis. Right carotid system: Right CCA patent from its origin to the bifurcation without stenosis. Scattered mixed plaque about the right carotid bulb/proximal right ICA without significant stenosis. Right ICA patent distally without stenosis, dissection or occlusion. Left carotid system: Left CCA patent from  its origin to the bifurcation without stenosis. Scattered mixed plaque about the left carotid bulb/proximal left ICA without stenosis. Left ICA patent distally without stenosis, dissection or occlusion. Vertebral arteries: Both vertebral arteries arise from the subclavian arteries. No proximal subclavian artery stenosis. Atheromatous irregularity with multifocal mild-to-moderate stenoses noted throughout the vertebral arteries bilaterally. Vertebral arteries remain patent within the neck without occlusion or other acute vascular abnormality. Skeleton: No visible acute osseous finding. No discrete or worrisome osseous lesions. Other neck: No other acute soft tissue abnormality within the neck. No discrete mass or adenopathy. Upper chest: Infiltrating tumor involving the left breast and upper right chest wall with overlying skin thickening partially visualized. Visualized upper chest demonstrates no other acute finding. Review of the MIP images confirms the above findings CTA HEAD FINDINGS Anterior circulation: Petrous segments patent bilaterally. Atheromatous change throughout the carotid siphons with associated moderate to severe multifocal stenoses. Left A1 segment patent. Right A1 hypoplastic and/or absent, accounting for the diminutive right ICA is compared to the left. Normal anterior communicating artery complex. Atheromatous change throughout the A2/A3 segments with associated moderate multifocal stenoses, worse on the right. M1 segments mildly irregular but patent without high-grade stenosis. Normal MCA bifurcations. Distal MCA branches well perfused and symmetric. Diffuse Sheeler vessel atheromatous irregularity. Posterior circulation: Atheromatous change about the proximal V4 segments with associated moderate stenosis on the right and more severe focal stenosis on the left (series 4, image 138). Basilar diminutive and irregular but patent to its distal aspect without stenosis. Superior cerebral arteries  grossly patent at their origins. Both PCAs primarily supplied via the basilar. Atheromatous irregularity throughout the PCAs bilaterally. Short-segment proximal moderate right P2 stenosis (series 5, image 90). PCAs otherwise patent to their distal aspects without high-grade stenosis. Venous sinuses: Grossly patent allowing for timing the contrast bolus. Anatomic variants: Hypoplastic/absent right A1. No visible aneurysm. Review of the MIP images confirms the above findings CT Brain Perfusion Findings: ASPECTS: 10 CBF (<30%) Volume: 46mL Perfusion (Tmax>6.0s) volume: 26mL Mismatch Volume: 58mL Infarction Location:Negative CT perfusion with no evidence for acute core infarct or other perfusion deficit. IMPRESSION: 1. Negative CTA for emergent large vessel occlusion. 2. Negative CT perfusion with no evidence for acute core infarct or other perfusion deficit. 3. Extensive atherosclerotic change throughout the intracranial circulation. Associated moderate to severe stenoses throughout the carotid siphons, with moderate to severe bilateral proximal V4 stenoses. 4. Additional scattered atherosclerotic change elsewhere about the major arterial vasculature of the head and neck as above. No other hemodynamically significant or correctable stenosis. 5. 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 These results were communicated to Dr. Cheral Marker at 8:09 pm on 05/23/2021 by text page via the [facility] messaging system. Electronically Signed   By: Jeannine Boga M.D.   On: 05/23/2021 20:39   CT Angio Neck W and/or Wo Contrast  Result Date: 05/23/2021 CLINICAL DATA:  Initial evaluation for acute stroke, possible right-sided weakness. EXAM:  CT ANGIOGRAPHY HEAD AND NECK CT PERFUSION BRAIN TECHNIQUE: Multidetector CT imaging of the head and neck was performed using the standard protocol during bolus administration of intravenous contrast. Multiplanar CT image reconstructions and MIPs  were obtained to evaluate the vascular anatomy. Carotid stenosis measurements (when applicable) are obtained utilizing NASCET criteria, using the distal internal carotid diameter as the denominator. Multiphase CT imaging of the brain was performed following IV bolus contrast injection. Subsequent parametric perfusion maps were calculated using RAPID software. CONTRAST:  114mL OMNIPAQUE IOHEXOL 350 MG/ML SOLN COMPARISON:  Prior head CT from earlier the same day. FINDINGS: CTA NECK FINDINGS Aortic arch: Visualized aortic arch normal caliber with normal branch pattern. Atheromatous change about the arch and origin of the great vessels without hemodynamically significant stenosis. Right carotid system: Right CCA patent from its origin to the bifurcation without stenosis. Scattered mixed plaque about the right carotid bulb/proximal right ICA without significant stenosis. Right ICA patent distally without stenosis, dissection or occlusion. Left carotid system: Left CCA patent from its origin to the bifurcation without stenosis. Scattered mixed plaque about the left carotid bulb/proximal left ICA without stenosis. Left ICA patent distally without stenosis, dissection or occlusion. Vertebral arteries: Both vertebral arteries arise from the subclavian arteries. No proximal subclavian artery stenosis. Atheromatous irregularity with multifocal mild-to-moderate stenoses noted throughout the vertebral arteries bilaterally. Vertebral arteries remain patent within the neck without occlusion or other acute vascular abnormality. Skeleton: No visible acute osseous finding. No discrete or worrisome osseous lesions. Other neck: No other acute soft tissue abnormality within the neck. No discrete mass or adenopathy. Upper chest: Infiltrating tumor involving the left breast and upper right chest wall with overlying skin thickening partially visualized. Visualized upper chest demonstrates no other acute finding. Review of the MIP images  confirms the above findings CTA HEAD FINDINGS Anterior circulation: Petrous segments patent bilaterally. Atheromatous change throughout the carotid siphons with associated moderate to severe multifocal stenoses. Left A1 segment patent. Right A1 hypoplastic and/or absent, accounting for the diminutive right ICA is compared to the left. Normal anterior communicating artery complex. Atheromatous change throughout the A2/A3 segments with associated moderate multifocal stenoses, worse on the right. M1 segments mildly irregular but patent without high-grade stenosis. Normal MCA bifurcations. Distal MCA branches well perfused and symmetric. Diffuse Kluge vessel atheromatous irregularity. Posterior circulation: Atheromatous change about the proximal V4 segments with associated moderate stenosis on the right and more severe focal stenosis on the left (series 4, image 138). Basilar diminutive and irregular but patent to its distal aspect without stenosis. Superior cerebral arteries grossly patent at their origins. Both PCAs primarily supplied via the basilar. Atheromatous irregularity throughout the PCAs bilaterally. Short-segment proximal moderate right P2 stenosis (series 5, image 90). PCAs otherwise patent to their distal aspects without high-grade stenosis. Venous sinuses: Grossly patent allowing for timing the contrast bolus. Anatomic variants: Hypoplastic/absent right A1. No visible aneurysm. Review of the MIP images confirms the above findings CT Brain Perfusion Findings: ASPECTS: 10 CBF (<30%) Volume: 61mL Perfusion (Tmax>6.0s) volume: 53mL Mismatch Volume: 83mL Infarction Location:Negative CT perfusion with no evidence for acute core infarct or other perfusion deficit. IMPRESSION: 1. Negative CTA for emergent large vessel occlusion. 2. Negative CT perfusion with no evidence for acute core infarct or other perfusion deficit. 3. Extensive atherosclerotic change throughout the intracranial circulation. Associated moderate  to severe stenoses throughout the carotid siphons, with moderate to severe bilateral proximal V4 stenoses. 4. Additional scattered atherosclerotic change elsewhere about the major arterial vasculature of the head and  neck as above. No other hemodynamically significant or correctable stenosis. 5. 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 These results were communicated to Dr. Cheral Marker at 8:09 pm on 05/23/2021 by text page via the [facility] messaging system. Electronically Signed   By: Jeannine Boga M.D.   On: 05/23/2021 20:39   CT CEREBRAL PERFUSION W CONTRAST  Result Date: 05/23/2021 CLINICAL DATA:  Initial evaluation for acute stroke, possible right-sided weakness. EXAM: CT ANGIOGRAPHY HEAD AND NECK CT PERFUSION BRAIN TECHNIQUE: Multidetector CT imaging of the head and neck was performed using the standard protocol during bolus administration of intravenous contrast. Multiplanar CT image reconstructions and MIPs were obtained to evaluate the vascular anatomy. Carotid stenosis measurements (when applicable) are obtained utilizing NASCET criteria, using the distal internal carotid diameter as the denominator. Multiphase CT imaging of the brain was performed following IV bolus contrast injection. Subsequent parametric perfusion maps were calculated using RAPID software. CONTRAST:  164mL OMNIPAQUE IOHEXOL 350 MG/ML SOLN COMPARISON:  Prior head CT from earlier the same day. FINDINGS: CTA NECK FINDINGS Aortic arch: Visualized aortic arch normal caliber with normal branch pattern. Atheromatous change about the arch and origin of the great vessels without hemodynamically significant stenosis. Right carotid system: Right CCA patent from its origin to the bifurcation without stenosis. Scattered mixed plaque about the right carotid bulb/proximal right ICA without significant stenosis. Right ICA patent distally without stenosis, dissection or occlusion. Left carotid  system: Left CCA patent from its origin to the bifurcation without stenosis. Scattered mixed plaque about the left carotid bulb/proximal left ICA without stenosis. Left ICA patent distally without stenosis, dissection or occlusion. Vertebral arteries: Both vertebral arteries arise from the subclavian arteries. No proximal subclavian artery stenosis. Atheromatous irregularity with multifocal mild-to-moderate stenoses noted throughout the vertebral arteries bilaterally. Vertebral arteries remain patent within the neck without occlusion or other acute vascular abnormality. Skeleton: No visible acute osseous finding. No discrete or worrisome osseous lesions. Other neck: No other acute soft tissue abnormality within the neck. No discrete mass or adenopathy. Upper chest: Infiltrating tumor involving the left breast and upper right chest wall with overlying skin thickening partially visualized. Visualized upper chest demonstrates no other acute finding. Review of the MIP images confirms the above findings CTA HEAD FINDINGS Anterior circulation: Petrous segments patent bilaterally. Atheromatous change throughout the carotid siphons with associated moderate to severe multifocal stenoses. Left A1 segment patent. Right A1 hypoplastic and/or absent, accounting for the diminutive right ICA is compared to the left. Normal anterior communicating artery complex. Atheromatous change throughout the A2/A3 segments with associated moderate multifocal stenoses, worse on the right. M1 segments mildly irregular but patent without high-grade stenosis. Normal MCA bifurcations. Distal MCA branches well perfused and symmetric. Diffuse Radloff vessel atheromatous irregularity. Posterior circulation: Atheromatous change about the proximal V4 segments with associated moderate stenosis on the right and more severe focal stenosis on the left (series 4, image 138). Basilar diminutive and irregular but patent to its distal aspect without stenosis.  Superior cerebral arteries grossly patent at their origins. Both PCAs primarily supplied via the basilar. Atheromatous irregularity throughout the PCAs bilaterally. Short-segment proximal moderate right P2 stenosis (series 5, image 90). PCAs otherwise patent to their distal aspects without high-grade stenosis. Venous sinuses: Grossly patent allowing for timing the contrast bolus. Anatomic variants: Hypoplastic/absent right A1. No visible aneurysm. Review of the MIP images confirms the above findings CT Brain Perfusion Findings: ASPECTS: 10 CBF (<30%) Volume: 1mL Perfusion (Tmax>6.0s) volume: 74mL Mismatch Volume:  29mL Infarction Location:Negative CT perfusion with no evidence for acute core infarct or other perfusion deficit. IMPRESSION: 1. Negative CTA for emergent large vessel occlusion. 2. Negative CT perfusion with no evidence for acute core infarct or other perfusion deficit. 3. Extensive atherosclerotic change throughout the intracranial circulation. Associated moderate to severe stenoses throughout the carotid siphons, with moderate to severe bilateral proximal V4 stenoses. 4. Additional scattered atherosclerotic change elsewhere about the major arterial vasculature of the head and neck as above. No other hemodynamically significant or correctable stenosis. 5. 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 These results were communicated to Dr. Cheral Marker at 8:09 pm on 05/23/2021 by text page via the [facility] messaging system. Electronically Signed   By: Jeannine Boga M.D.   On: 05/23/2021 20:39   DG Chest Portable 1 View  Result Date: 05/23/2021 CLINICAL DATA:  75 year old female with weakness. EXAM: PORTABLE CHEST 1 VIEW COMPARISON:  Chest radiograph dated 09/07/2018. FINDINGS: Left-sided Port-A-Cath with tip at the cavoatrial junction. Mild diffuse chronic interstitial coarsening. No focal consolidations crash that there is a 2.8 x 2.0 cm indeterminate  lobulated pleural base consolidation along the lateral right upper lobe not seen on the prior radiograph or CT. Further evaluation with chest CT is recommended. There is no pleural effusion pneumothorax. The cardiac silhouette is within limits. Atherosclerotic calcification of the aorta. No acute osseous pathology. IMPRESSION: Lobulated pleural base consolidation/nodule along the lateral right upper lobe. Further evaluation with chest CT is recommended. Electronically Signed   By: Anner Crete M.D.   On: 05/23/2021 21:43   CT HEAD CODE STROKE WO CONTRAST  Result Date: 05/23/2021 CLINICAL DATA:  Code stroke. Stroke. New onset of right-sided weakness. EXAM: CT HEAD WITHOUT CONTRAST TECHNIQUE: Contiguous axial images were obtained from the base of the skull through the vertex without intravenous contrast. COMPARISON:  CT head without contrast 04/04/2017 FINDINGS: Brain: A remote inferior right occipital lobe infarct is new since the prior study, but not acute. A left parietal lobe cortical infarct demonstrates volume loss and also appears remote. Remote white matter infarct of the genu of the right internal capsule again noted. Remote infarct of the posterior left frontal lobe appeared more acute on the prior exam. No acute cortical infarcts are present. No hemorrhage or mass lesion is present. Progressive moderate periventricular white matter disease is present. No acute hemorrhage or mass lesion is present. Remote lacunar infarcts are present in the left cerebellum. The ventricles are proportionate to the degree of atrophy. No significant extraaxial fluid collection is present. Vascular: Atherosclerotic calcifications are present within the cavernous internal carotid arteries. No hyperdense vessel is present. Skull: Insert normal skull No significant extracranial soft tissue lesion is present. Sinuses/Orbits: The paranasal sinuses and mastoid air cells are clear. The globes and orbits are within normal  limits. ASPECTS [facility] Stroke Program Early CT Score) - Ganglionic level infarction (caudate, lentiform nuclei, internal capsule, insula, M1-M3 cortex): 7/7 - Supraganglionic infarction (M4-M6 cortex): 3/3 Total score (0-10 with 10 being normal): 10/10 IMPRESSION: 1. No definite acute intracranial abnormality. 2. Left frontal lobe infarct appears remote with volume loss. There was evidence for acute infarct in this area on the prior study. 3. Multiple remote infarcts as described. 4. Progressive moderate atrophy and white matter disease. This likely reflects the sequela of chronic microvascular ischemia. 5. Aspects 10/10 The above was relayed via text pager to Dr. Cheral Marker on 05/23/2021 at 19:43 . Electronically Signed   By: San Morelle  M.D.   On: 05/23/2021 19:44     PHYSICAL EXAM  Temp:  [98 F (36.7 C)-102.4 F (39.1 C)] 100.6 F (38.1 C) (07/14 1356) Pulse Rate:  [102-120] 109 (07/14 1356) Resp:  [12-29] 17 (07/14 1356) BP: (122-175)/(62-104) 160/76 (07/14 1356) SpO2:  [90 %-100 %] 97 % (07/14 1356) Weight:  [102.4 kg] 102.4 kg (07/13 1923)  General - Well nourished, well developed, in no apparent distress.  Ophthalmologic - fundi not visualized due to noncooperation.  Cardiovascular - Regular rhythm and rate.  Mental Status -  Level of arousal and orientation to time, place, and person were intact. Language including expression, naming, repetition, comprehension was assessed and found intact. Fund of Knowledge was assessed and was intact.  Cranial Nerves II - XII - II - Visual field intact OU except ? left upper quadrantanopia. III, IV, VI - Extraocular movements intact. V - Facial sensation intact bilaterally. VII - Facial movement intact bilaterally. VIII - Hearing & vestibular intact bilaterally. X - Palate elevates symmetrically. XI - Chin turning & shoulder shrug intact bilaterally. XII - Tongue protrusion intact.  Motor Strength - The patient's strength was  normal in left UE and LE extremities and RUE and RLE proximal 4/5 and distal 5/5.  Bulk was normal and fasciculations were absent.   Motor Tone - Muscle tone was assessed at the neck and appendages and was normal.  Reflexes - The patient's reflexes were symmetrical in all extremities and she had no pathological reflexes.  Sensory - Light touch, temperature/pinprick were assessed and were symmetrical.    Coordination - The patient had normal movements in the hands and feet with no ataxia or dysmetria.  Tremor was absent.  Gait and Station - deferred.   ASSESSMENT/PLAN Ms. Barbie Croston is a 74 y.o. female with history of stroke in 03/2017, status post left CEA, hypertension, breast cancer with metastasis admitted for generalized weakness, increased right lower extremity weakness, and left facial droop. No tPA given due to outside window.    Metabolic encephalopathy COVID infection Generalized weakness COVID PCR positive Fever with tachycardia On remdesivir  Recrudescence of old stroke 03/2017 left MCA, bilateral ACA patchy acute infarcts.  MRA/CTA head showed severe right tandem and moderate left ICA siphon stenosis, severe bilateral VA stenosis, and severe right M1 and P2 stenosis as well as azygous ACAs. Carotid Doppler and CTA neck showed left ICA high-grade critical stenosis. EF 65-70%. DVT negative. LDL 130, A1C 5.7. Her stroke is considered due to left ICA stenosis. [facility] Dr. Donzetta Matters consulted and had left CEA on 2/77/41 without complication. She was discharged to CIR with DAPT and lipitor 80. Follows with Dr. Donzetta Matters at [facility] and repeat CUS in 10/2017 showed left ICA 40-59% stenosis.  CT no acute finding, old left frontal infarct CTA head and neck severe bilateral siphon stenosis, bilateral V4 stenosis MRI with and without contrast pending 2D Echo EF 65 to 70% LDL 73 HgbA1c 6.5 Lovenox for VTE prophylaxis clopidogrel 75 mg daily prior to admission, now on clopidogrel 75 mg daily.  Patient  counseled to be compliant with her antithrombotic medications Ongoing aggressive stroke risk factor management Therapy recommendations: Pending Disposition: Pending  Hypertension Stable Long term BP goal normotensive  Hyperlipidemia Home meds: Lipitor 80 LDL 73, goal < 70 Now on Lipitor 80 Continue statin at discharge  Other Stroke Risk Factors Advanced age Obesity, Body mass index is 36.44 kg/m.  Hx stroke/TIA Breast cancer with metastasis  Other Active Problems Fever/tachycardia - likely due to  COVID infection AKI, creatinine 1.12-1.10  Hospital day # 0   Rosalin Hawking, MD PhD Stroke Neurology 05/24/2021 2:40 PM    To contact Stroke Continuity provider, please refer to [email or web address] After [email or web address] contact [facility]

## 2021-05-24 NOTE — H&P (Signed)
History and Physical    Suhailah Kwan DXI:[id number] DOB: [birth year] DOA: 05/23/2021  PCP: [name], DO  Patient coming from: Home, daughter at bedside  I have personally briefly reviewed patient's old medical records in [place]  Chief Complaint: Unable to walk  HPI: Hend Mccarrell is a 75 y.o. female with medical history significant for CVA with residual right hemiparesis, metastatic breast cancer on palliative chemotherapy, carotid stenosis s/p left CEA, hypertension who presents with concerns of not being able to walk.  She woke up and was otherwise in her normal state of health but her following lunchtime she was unable to get up.  Daughter at bedside states that she slid out of her chair and needed two person to get her back up.  Chronic tremors on right hand seemed worse. She denies any numbness or tingling.  She has been having some progressive worsening generalized weakness but difficulty with ambulating today is completely new.  She denies any facial asymmetry, slurred speech.  No chest pain, shortness of breath, cough.  No nausea, vomiting or diarrhea.  On arrival to the ED, she was taken emergently to [facility] with neurology.  No significant neurological findings on exam other than mild right lower extremity weakness noted by ED physician.  She was evaluated by neurology Dr. Cheral Marker and CT angio head and neck was obtained which were both negative for large vessel occlusion.  Neurology recommended hospitalist admission for further observation.  Subsequently her COVID PCR later returned positive.  Review of Systems: Constitutional: No Weight Change, No Fever ENT/Mouth: No sore throat, No Rhinorrhea Eyes: No Eye Pain, No Vision Changes Cardiovascular: No Chest Pain, no SOB Respiratory: No Cough, No Sputum, No Wheezing, no Dyspnea  Gastrointestinal: No Nausea, No Vomiting, No Diarrhea, No Constipation, No Pain Genitourinary: no Urinary Incontinence, No Urgency, No  Flank Pain Musculoskeletal: No Arthralgias, No Myalgias Skin: No Skin Lesions, No Pruritus, Neuro: + Weakness, No Numbness Psych: No Anxiety/Panic, No Depression, no decrease appetite Heme/Lymph: No Bruising, No Bleeding  Past Medical History:  Diagnosis Date   Anxiety    Cancer ([place])    breast cancer- right   Carotid stenosis    Depression    situational   Dyspnea    with much ambulation   GERD (gastroesophageal reflux disease)    Hypertension    Stroke ([place]) 04/01/2017   a little weakness right side leg and hand, a little memry loss    Past Surgical History:  Procedure Laterality Date   ABDOMINAL HYSTERECTOMY     ENDARTERECTOMY Left 04/08/2017   Procedure: ENDARTERECTOMY CAROTID;  Surgeon: Waynetta Sandy, MD;  Location: [facility];  Service: Vascular;  Laterality: Left;   PATCH ANGIOPLASTY Left 04/08/2017   Procedure: PATCH ANGIOPLASTY Left Carotid;  Surgeon: Waynetta Sandy, MD;  Location: [facility];  Service: Vascular;  Laterality: Left;   PORTACATH PLACEMENT Left 09/07/2018   Procedure: INSERTION PORT-A-CATH LEFT INTERNAL JUGULAR;  Surgeon: [name], MD;  Location: [facility];  Service: General;  Laterality: Left;     reports that she has never smoked. She has never used smokeless tobacco. She reports that she does not drink alcohol and does not use drugs. Social History  No Known Allergies  Family History  Problem Relation Age of Onset   Cancer Mother        ovarian     Prior to Admission medications   Medication Sig Start Date End Date Taking? Authorizing Provider  amLODipine (NORVASC) 10 MG  tablet TAKE 1 TABLET BY MOUTH ONCE DAILY 03/09/20 06/07/21  [name], MD  atorvastatin (LIPITOR) 80 MG tablet TAKE 1 TABLET BY MOUTH ONCE DAILY 12/13/20 12/13/21  [name], MD  capecitabine (XELODA) 500 MG tablet Take 2 tablets (1,000 mg total) by mouth daily. Take within 30 minutes after breakfast. 05/02/21   Magrinat, [name], MD  clopidogrel (PLAVIX)  75 MG tablet Take 1 tablet (75 mg total) by mouth daily. 04/24/21     latanoprost (XALATAN) 0.005 % ophthalmic solution PLACE 1 DROP IN BOTH EYES EVERY EVENING 01/15/21 01/15/22  Bettina Gavia, OD  lisinopril-hydrochlorothiazide (ZESTORETIC) 20-12.5 MG tablet Take 1 tablet by mouth daily 04/24/21     loratadine (CLARITIN) 10 MG tablet Take 10 mg by mouth daily as needed for allergies.    [provider]  potassium chloride SA (K-DUR,KLOR-CON) 20 MEQ tablet Take 1 tablet (20 mEq total) by mouth daily. 11/03/18   Tanner, Lyndon Code., PA-C  pregabalin (LYRICA) 25 MG capsule Take 1 capsule (25 mg total) by mouth 2 (two) times daily as needed for pain 04/24/21     traMADol (ULTRAM) 50 MG tablet Take 1-2 tablets (50-100 mg total) by mouth every 6 (six) hours as needed. 05/01/21   Magrinat, [name], MD  prochlorperazine (COMPAZINE) 10 MG tablet Take 1 tablet (10 mg total) by mouth 3 (three) times daily before meals. 08/18/19 11/10/19  MagrinatVirgie Dad, MD    Physical Exam: Vitals:   05/23/21 2330 05/23/21 2345 05/24/21 0000 05/24/21 0015  BP: (!) 169/82 (!) 170/81 (!) 156/92 (!) 175/104  Pulse: (!) 109 (!) 108 (!) 120 (!) 111  Resp: 16 (!) 22 (!) 21 14  Temp:      SpO2: 100% 100% 100% 100%  Weight:        Constitutional: NAD, calm, comfortable, elderly female appearing younger than stated age sitting upright in bed Vitals:   05/23/21 2330 05/23/21 2345 05/24/21 0000 05/24/21 0015  BP: (!) 169/82 (!) 170/81 (!) 156/92 (!) 175/104  Pulse: (!) 109 (!) 108 (!) 120 (!) 111  Resp: 16 (!) 22 (!) 21 14  Temp:      SpO2: 100% 100% 100% 100%  Weight:       Eyes: PERRL, lids and conjunctivae normal ENMT: Mucous membranes are moist.  Neck: normal, supple Respiratory: clear to auscultation bilaterally, no wheezing, no crackles. Normal respiratory effort. No accessory muscle use.  Cardiovascular: Regular rate and rhythm, no murmurs / rubs / gallops.  +2 pitting edema to distal pretibial  region Abdomen: no tenderness, no masses palpated.  Bowel sounds positive.  Musculoskeletal: no clubbing / cyanosis. No joint deformity upper and lower extremities. Good ROM, no contractures. Normal muscle tone.  Skin: no rashes, lesions, ulcers. No induration Neurologic: CN 2-12 grossly intact. Sensation intact. Strength 4/5 in right lower extremity compared to 5 out of 5 strength left.  Difficulty with pulling to sit up with upper extremity due to breast cancer. Psychiatric: Normal judgment and insight. Alert and oriented x 3. Normal mood.     Labs on Admission: I have personally reviewed following labs and imaging studies  CBC: Recent Labs  Lab 05/23/21 1925 05/23/21 1936  WBC 8.7  --   NEUTROABS 6.1  --   HGB 10.6* 10.5*  HCT 35.1* 31.0*  MCV 81.1  --   PLT 185  --    Basic Metabolic Panel: Recent Labs  Lab 05/23/21 1925 05/23/21 1936  NA 139 139  K 3.7 3.7  CL 106 106  CO2 23  --   GLUCOSE 138* 134*  BUN 16 15  CREATININE 1.12* 1.10*  CALCIUM 8.9  --    GFR: Estimated Creatinine Clearance: 55 mL/min (A) (by C-G formula based on SCr of 1.1 mg/dL (H)). Liver Function Tests: Recent Labs  Lab 05/23/21 1925  AST 20  ALT 18  ALKPHOS 115  BILITOT 0.7  PROT 6.9  ALBUMIN 3.6   No results for input(s): LIPASE, AMYLASE in the last 168 hours. No results for input(s): AMMONIA in the last 168 hours. Coagulation Profile: Recent Labs  Lab 05/23/21 1925  INR 1.1   Cardiac Enzymes: No results for input(s): CKTOTAL, CKMB, CKMBINDEX, TROPONINI in the last 168 hours. BNP (last 3 results) No results for input(s): PROBNP in the last 8760 hours. HbA1C: No results for input(s): HGBA1C in the last 72 hours. CBG: Recent Labs  Lab 05/23/21 1926  GLUCAP 131*   Lipid Profile: No results for input(s): CHOL, HDL, LDLCALC, TRIG, CHOLHDL, LDLDIRECT in the last 72 hours. Thyroid Function Tests: No results for input(s): TSH, T4TOTAL, FREET4, T3FREE, THYROIDAB in the last 72  hours. Anemia Panel: No results for input(s): VITAMINB12, FOLATE, FERRITIN, TIBC, IRON, RETICCTPCT in the last 72 hours. Urine analysis:    Component Value Date/Time   COLORURINE STRAW (A) 05/23/2021 2014   APPEARANCEUR CLEAR 05/23/2021 2014   LABSPEC 1.035 (H) 05/23/2021 2014   PHURINE 7.0 05/23/2021 2014   GLUCOSEU NEGATIVE 05/23/2021 2014   HGBUR [place] (A) 05/23/2021 2014   [place] NEGATIVE 05/23/2021 2014   [place] NEGATIVE 05/23/2021 2014   PROTEINUR NEGATIVE 05/23/2021 2014   NITRITE NEGATIVE 05/23/2021 2014   LEUKOCYTESUR TRACE (A) 05/23/2021 2014    Radiological Exams on Admission: CT Angio Head W or Wo Contrast  Result Date: 05/23/2021 CLINICAL DATA:  Initial evaluation for acute stroke, possible right-sided weakness. EXAM: CT ANGIOGRAPHY HEAD AND NECK CT PERFUSION BRAIN TECHNIQUE: Multidetector CT imaging of the head and neck was performed using the standard protocol during bolus administration of intravenous contrast. Multiplanar CT image reconstructions and MIPs were obtained to evaluate the vascular anatomy. Carotid stenosis measurements (when applicable) are obtained utilizing NASCET criteria, using the distal internal carotid diameter as the denominator. Multiphase CT imaging of the brain was performed following IV bolus contrast injection. Subsequent parametric perfusion maps were calculated using RAPID software. CONTRAST:  135mL OMNIPAQUE IOHEXOL 350 MG/ML SOLN COMPARISON:  Prior head CT from earlier the same day. FINDINGS: CTA NECK FINDINGS Aortic arch: Visualized aortic arch normal caliber with normal branch pattern. Atheromatous change about the arch and origin of the great vessels without hemodynamically significant stenosis. Right carotid system: Right CCA patent from its origin to the bifurcation without stenosis. Scattered mixed plaque about the right carotid bulb/proximal right ICA without significant stenosis. Right ICA patent distally without stenosis,  dissection or occlusion. Left carotid system: Left CCA patent from its origin to the bifurcation without stenosis. Scattered mixed plaque about the left carotid bulb/proximal left ICA without stenosis. Left ICA patent distally without stenosis, dissection or occlusion. Vertebral arteries: Both vertebral arteries arise from the subclavian arteries. No proximal subclavian artery stenosis. Atheromatous irregularity with multifocal mild-to-moderate stenoses noted throughout the vertebral arteries bilaterally. Vertebral arteries remain patent within the neck without occlusion or other acute vascular abnormality. Skeleton: No visible acute osseous finding. No discrete or worrisome osseous lesions. Other neck: No other acute soft tissue abnormality within the neck. No discrete mass or adenopathy. Upper chest: Infiltrating tumor involving the left  breast and upper right chest wall with overlying skin thickening partially visualized. Visualized upper chest demonstrates no other acute finding. Review of the MIP images confirms the above findings CTA HEAD FINDINGS Anterior circulation: Petrous segments patent bilaterally. Atheromatous change throughout the carotid siphons with associated moderate to severe multifocal stenoses. Left A1 segment patent. Right A1 hypoplastic and/or absent, accounting for the diminutive right ICA is compared to the left. Normal anterior communicating artery complex. Atheromatous change throughout the A2/A3 segments with associated moderate multifocal stenoses, worse on the right. M1 segments mildly irregular but patent without high-grade stenosis. Normal MCA bifurcations. Distal MCA branches well perfused and symmetric. Diffuse Ishikawa vessel atheromatous irregularity. Posterior circulation: Atheromatous change about the proximal V4 segments with associated moderate stenosis on the right and more severe focal stenosis on the left (series 4, image 138). Basilar diminutive and irregular but patent to  its distal aspect without stenosis. Superior cerebral arteries grossly patent at their origins. Both PCAs primarily supplied via the basilar. Atheromatous irregularity throughout the PCAs bilaterally. Short-segment proximal moderate right P2 stenosis (series 5, image 90). PCAs otherwise patent to their distal aspects without high-grade stenosis. Venous sinuses: Grossly patent allowing for timing the contrast bolus. Anatomic variants: Hypoplastic/absent right A1. No visible aneurysm. Review of the MIP images confirms the above findings CT Brain Perfusion Findings: ASPECTS: 10 CBF (<30%) Volume: 67mL Perfusion (Tmax>6.0s) volume: 27mL Mismatch Volume: 17mL Infarction Location:Negative CT perfusion with no evidence for acute core infarct or other perfusion deficit. IMPRESSION: 1. Negative CTA for emergent large vessel occlusion. 2. Negative CT perfusion with no evidence for acute core infarct or other perfusion deficit. 3. Extensive atherosclerotic change throughout the intracranial circulation. Associated moderate to severe stenoses throughout the carotid siphons, with moderate to severe bilateral proximal V4 stenoses. 4. Additional scattered atherosclerotic change elsewhere about the major arterial vasculature of the head and neck as above. No other hemodynamically significant or correctable stenosis. 5. 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 These results were communicated to Dr. Cheral Marker at 8:09 pm on 05/23/2021 by text page via the [facility] messaging system. Electronically Signed   By: Jeannine Boga M.D.   On: 05/23/2021 20:39   CT Angio Neck W and/or Wo Contrast  Result Date: 05/23/2021 CLINICAL DATA:  Initial evaluation for acute stroke, possible right-sided weakness. EXAM: CT ANGIOGRAPHY HEAD AND NECK CT PERFUSION BRAIN TECHNIQUE: Multidetector CT imaging of the head and neck was performed using the standard protocol during bolus administration of  intravenous contrast. Multiplanar CT image reconstructions and MIPs were obtained to evaluate the vascular anatomy. Carotid stenosis measurements (when applicable) are obtained utilizing NASCET criteria, using the distal internal carotid diameter as the denominator. Multiphase CT imaging of the brain was performed following IV bolus contrast injection. Subsequent parametric perfusion maps were calculated using RAPID software. CONTRAST:  148mL OMNIPAQUE IOHEXOL 350 MG/ML SOLN COMPARISON:  Prior head CT from earlier the same day. FINDINGS: CTA NECK FINDINGS Aortic arch: Visualized aortic arch normal caliber with normal branch pattern. Atheromatous change about the arch and origin of the great vessels without hemodynamically significant stenosis. Right carotid system: Right CCA patent from its origin to the bifurcation without stenosis. Scattered mixed plaque about the right carotid bulb/proximal right ICA without significant stenosis. Right ICA patent distally without stenosis, dissection or occlusion. Left carotid system: Left CCA patent from its origin to the bifurcation without stenosis. Scattered mixed plaque about the left carotid bulb/proximal left ICA without stenosis. Left ICA patent  distally without stenosis, dissection or occlusion. Vertebral arteries: Both vertebral arteries arise from the subclavian arteries. No proximal subclavian artery stenosis. Atheromatous irregularity with multifocal mild-to-moderate stenoses noted throughout the vertebral arteries bilaterally. Vertebral arteries remain patent within the neck without occlusion or other acute vascular abnormality. Skeleton: No visible acute osseous finding. No discrete or worrisome osseous lesions. Other neck: No other acute soft tissue abnormality within the neck. No discrete mass or adenopathy. Upper chest: Infiltrating tumor involving the left breast and upper right chest wall with overlying skin thickening partially visualized. Visualized upper  chest demonstrates no other acute finding. Review of the MIP images confirms the above findings CTA HEAD FINDINGS Anterior circulation: Petrous segments patent bilaterally. Atheromatous change throughout the carotid siphons with associated moderate to severe multifocal stenoses. Left A1 segment patent. Right A1 hypoplastic and/or absent, accounting for the diminutive right ICA is compared to the left. Normal anterior communicating artery complex. Atheromatous change throughout the A2/A3 segments with associated moderate multifocal stenoses, worse on the right. M1 segments mildly irregular but patent without high-grade stenosis. Normal MCA bifurcations. Distal MCA branches well perfused and symmetric. Diffuse [place] vessel atheromatous irregularity. Posterior circulation: Atheromatous change about the proximal V4 segments with associated moderate stenosis on the right and more severe focal stenosis on the left (series 4, image 138). Basilar diminutive and irregular but patent to its distal aspect without stenosis. Superior cerebral arteries grossly patent at their origins. Both PCAs primarily supplied via the basilar. Atheromatous irregularity throughout the PCAs bilaterally. Short-segment proximal moderate right P2 stenosis (series 5, image 90). PCAs otherwise patent to their distal aspects without high-grade stenosis. Venous sinuses: Grossly patent allowing for timing the contrast bolus. Anatomic variants: Hypoplastic/absent right A1. No visible aneurysm. Review of the MIP images confirms the above findings CT Brain Perfusion Findings: ASPECTS: 10 CBF (<30%) Volume: 62mL Perfusion (Tmax>6.0s) volume: 73mL Mismatch Volume: 68mL Infarction Location:Negative CT perfusion with no evidence for acute core infarct or other perfusion deficit. IMPRESSION: 1. Negative CTA for emergent large vessel occlusion. 2. Negative CT perfusion with no evidence for acute core infarct or other perfusion deficit. 3. Extensive atherosclerotic  change throughout the intracranial circulation. Associated moderate to severe stenoses throughout the carotid siphons, with moderate to severe bilateral proximal V4 stenoses. 4. Additional scattered atherosclerotic change elsewhere about the major arterial vasculature of the head and neck as above. No other hemodynamically significant or correctable stenosis. 5. 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 These results were communicated to Dr. Cheral Marker at 8:09 pm on 05/23/2021 by text page via the [facility] messaging system. Electronically Signed   By: Jeannine Boga M.D.   On: 05/23/2021 20:39   CT CEREBRAL PERFUSION W CONTRAST  Result Date: 05/23/2021 CLINICAL DATA:  Initial evaluation for acute stroke, possible right-sided weakness. EXAM: CT ANGIOGRAPHY HEAD AND NECK CT PERFUSION BRAIN TECHNIQUE: Multidetector CT imaging of the head and neck was performed using the standard protocol during bolus administration of intravenous contrast. Multiplanar CT image reconstructions and MIPs were obtained to evaluate the vascular anatomy. Carotid stenosis measurements (when applicable) are obtained utilizing NASCET criteria, using the distal internal carotid diameter as the denominator. Multiphase CT imaging of the brain was performed following IV bolus contrast injection. Subsequent parametric perfusion maps were calculated using RAPID software. CONTRAST:  155mL OMNIPAQUE IOHEXOL 350 MG/ML SOLN COMPARISON:  Prior head CT from earlier the same day. FINDINGS: CTA NECK FINDINGS Aortic arch: Visualized aortic arch normal caliber with normal branch pattern.  Atheromatous change about the arch and origin of the great vessels without hemodynamically significant stenosis. Right carotid system: Right CCA patent from its origin to the bifurcation without stenosis. Scattered mixed plaque about the right carotid bulb/proximal right ICA without significant stenosis. Right ICA patent  distally without stenosis, dissection or occlusion. Left carotid system: Left CCA patent from its origin to the bifurcation without stenosis. Scattered mixed plaque about the left carotid bulb/proximal left ICA without stenosis. Left ICA patent distally without stenosis, dissection or occlusion. Vertebral arteries: Both vertebral arteries arise from the subclavian arteries. No proximal subclavian artery stenosis. Atheromatous irregularity with multifocal mild-to-moderate stenoses noted throughout the vertebral arteries bilaterally. Vertebral arteries remain patent within the neck without occlusion or other acute vascular abnormality. Skeleton: No visible acute osseous finding. No discrete or worrisome osseous lesions. Other neck: No other acute soft tissue abnormality within the neck. No discrete mass or adenopathy. Upper chest: Infiltrating tumor involving the left breast and upper right chest wall with overlying skin thickening partially visualized. Visualized upper chest demonstrates no other acute finding. Review of the MIP images confirms the above findings CTA HEAD FINDINGS Anterior circulation: Petrous segments patent bilaterally. Atheromatous change throughout the carotid siphons with associated moderate to severe multifocal stenoses. Left A1 segment patent. Right A1 hypoplastic and/or absent, accounting for the diminutive right ICA is compared to the left. Normal anterior communicating artery complex. Atheromatous change throughout the A2/A3 segments with associated moderate multifocal stenoses, worse on the right. M1 segments mildly irregular but patent without high-grade stenosis. Normal MCA bifurcations. Distal MCA branches well perfused and symmetric. Diffuse [place] vessel atheromatous irregularity. Posterior circulation: Atheromatous change about the proximal V4 segments with associated moderate stenosis on the right and more severe focal stenosis on the left (series 4, image 138). Basilar diminutive  and irregular but patent to its distal aspect without stenosis. Superior cerebral arteries grossly patent at their origins. Both PCAs primarily supplied via the basilar. Atheromatous irregularity throughout the PCAs bilaterally. Short-segment proximal moderate right P2 stenosis (series 5, image 90). PCAs otherwise patent to their distal aspects without high-grade stenosis. Venous sinuses: Grossly patent allowing for timing the contrast bolus. Anatomic variants: Hypoplastic/absent right A1. No visible aneurysm. Review of the MIP images confirms the above findings CT Brain Perfusion Findings: ASPECTS: 10 CBF (<30%) Volume: 55mL Perfusion (Tmax>6.0s) volume: 56mL Mismatch Volume: 72mL Infarction Location:Negative CT perfusion with no evidence for acute core infarct or other perfusion deficit. IMPRESSION: 1. Negative CTA for emergent large vessel occlusion. 2. Negative CT perfusion with no evidence for acute core infarct or other perfusion deficit. 3. Extensive atherosclerotic change throughout the intracranial circulation. Associated moderate to severe stenoses throughout the carotid siphons, with moderate to severe bilateral proximal V4 stenoses. 4. Additional scattered atherosclerotic change elsewhere about the major arterial vasculature of the head and neck as above. No other hemodynamically significant or correctable stenosis. 5. 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 These results were communicated to Dr. Cheral Marker at 8:09 pm on 05/23/2021 by text page via the [facility] messaging system. Electronically Signed   By: Jeannine Boga M.D.   On: 05/23/2021 20:39   DG Chest Portable 1 View  Result Date: 05/23/2021 CLINICAL DATA:  75 year old female with weakness. EXAM: PORTABLE CHEST 1 VIEW COMPARISON:  Chest radiograph dated 09/07/2018. FINDINGS: Left-sided Port-A-Cath with tip at the cavoatrial junction. Mild diffuse chronic interstitial coarsening. No focal  consolidations crash that there is a 2.8 x 2.0 cm indeterminate lobulated pleural  base consolidation along the lateral right upper lobe not seen on the prior radiograph or CT. Further evaluation with chest CT is recommended. There is no pleural effusion pneumothorax. The cardiac silhouette is within limits. Atherosclerotic calcification of the aorta. No acute osseous pathology. IMPRESSION: Lobulated pleural base consolidation/nodule along the lateral right upper lobe. Further evaluation with chest CT is recommended. Electronically Signed   By: Anner Crete M.D.   On: 05/23/2021 21:43   CT HEAD CODE STROKE WO CONTRAST  Result Date: 05/23/2021 CLINICAL DATA:  Code stroke. Stroke. New onset of right-sided weakness. EXAM: CT HEAD WITHOUT CONTRAST TECHNIQUE: Contiguous axial images were obtained from the base of the skull through the vertex without intravenous contrast. COMPARISON:  CT head without contrast 04/04/2017 FINDINGS: Brain: A remote inferior right occipital lobe infarct is new since the prior study, but not acute. A left parietal lobe cortical infarct demonstrates volume loss and also appears remote. Remote white matter infarct of the genu of the right internal capsule again noted. Remote infarct of the posterior left frontal lobe appeared more acute on the prior exam. No acute cortical infarcts are present. No hemorrhage or mass lesion is present. Progressive moderate periventricular white matter disease is present. No acute hemorrhage or mass lesion is present. Remote lacunar infarcts are present in the left cerebellum. The ventricles are proportionate to the degree of atrophy. No significant extraaxial fluid collection is present. Vascular: Atherosclerotic calcifications are present within the cavernous internal carotid arteries. No hyperdense vessel is present. Skull: Insert normal skull No significant extracranial soft tissue lesion is present. Sinuses/Orbits: The paranasal sinuses and mastoid air  cells are clear. The globes and orbits are within normal limits. ASPECTS [facility] Stroke Program Early CT Score) - Ganglionic level infarction (caudate, lentiform nuclei, internal capsule, insula, M1-M3 cortex): 7/7 - Supraganglionic infarction (M4-M6 cortex): 3/3 Total score (0-10 with 10 being normal): 10/10 IMPRESSION: 1. No definite acute intracranial abnormality. 2. Left frontal lobe infarct appears remote with volume loss. There was evidence for acute infarct in this area on the prior study. 3. Multiple remote infarcts as described. 4. Progressive moderate atrophy and white matter disease. This likely reflects the sequela of chronic microvascular ischemia. 5. Aspects 10/10 The above was relayed via text pager to Dr. Cheral Marker on 05/23/2021 at 19:43 . Electronically Signed   By: San Morelle M.D.   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  No other neurological deficits on exam. -Neurology has evaluated bedside and has obtained CTA head and neck which were both negative.Appreciate any further recommendations -COVID test subsequently returned positive which likely could be the cause of her weakness -Continue to observe overnight with frequent neurochecks - PT evaluation in the morning -continue Plavix   Sepsis secondary COVID viral infection -Patient presented with tachycardia and tachypnea -pt does not have any respiratory or GI symptoms at this time.  Although could be the cause of her new bilateral lower extremity weakness -Patient agreeable to starting IV remdesivir -Airborne and contact precaution  Renal insufficiency - Creatinine mildly elevated at 1.10 from prior of 1 - Hold lisinopril-HCTZ overnight  HTN -Continue amlodipine - Hold lisinopril-HCTZ overnight -PRN hydralazine for uncontrolled  hypertension overnight  Metastatic breast cancer - On palliative chemotherapy - Dr. Gwenlyn Perking is primary oncologist - continue Xeloda  DVT  prophylaxis:.Lovenox Code Status: Full Family Communication: Plan discussed with patient and daughter at bedside  disposition Plan: Home with observation Consults called:  Admission status: Observation  Level of care: Progressive  Status is: Observation  The patient remains OBS appropriate and will d/c before 2 midnights.  Dispo: The patient is from: Home              Anticipated d/c is to: Home              Patient currently is not medically stable to d/c.   Difficult to place patient No         Orene Desanctis DO [facility]   If 7PM-7AM, please contact night-coverage [email or web address]   05/24/2021, 1:31 AM

## 2021-05-24 NOTE — ED Provider Notes (Signed)
Discussed with Dr. Cheral Marker with neurology.  He recommends admission and monitoring to the hospitalist service Discussed with Dr. Flossie Buffy with [facility] will admit   Ripley Fraise, MD 05/24/21 [phone]

## 2021-05-24 NOTE — ED Notes (Signed)
PT at bedside. Pt was assisted OOB with PT. Daughter also at bedside. Xeloda home medication administered PO. Pt is able to swallow without difficulty. No cough or aspiration s/s noted. Pharmacist was contacted regarding medication and food. OK to eat per pharmacy. Patient was provided a bag meal with ginger ale, orange juice, cheese, and crackers.

## 2021-05-24 NOTE — Telephone Encounter (Signed)
Refilled on 6/22. Gardiner Rhyme, RN

## 2021-05-24 NOTE — ED Notes (Signed)
Returned form MRI.

## 2021-05-24 NOTE — Evaluation (Signed)
Physical Therapy Evaluation Patient Details Name: [name] [name] MRN: [id number] DOB: 11-09-1946 Today's Date: 05/24/2021   History of Present Illness  [name] [name] is a 75 y.o. female who presents with concerns of not being able to walk on 05/23/21.  COVID was +.  Pt admitted with sepsis secondary to [place].  CT Head negative for acute infarct.  MRI is pending.  PHMX: CVA with residual right hemiparesis, metastatic breast cancer on palliative chemotherapy, carotid stenosis s/p left CEA, hypertension.  Clinical Impression  Pt admitted with above diagnosis. At baseline , pt is independent.  Today, pt presenting with generalized weakness, decreased balance, coordination, slow movements/slow to respond, and decreased mobility - she is well below her baseline.  She required assist of 2 for transfers and to ambulate a few feet in room.  Requiring cues for sequence and weight shift.  Pt will benefit from acute PT services and post-acute rehab prior to returning home.  Pt currently with functional limitations due to the deficits listed below (see PT Problem List). Pt will benefit from skilled PT to increase their independence and safety with mobility to allow discharge to the venue listed below.       Follow Up Recommendations SNF    Equipment Recommendations  Rolling walker with 5" wheels;Wheelchair cushion (measurements PT);3in1 (PT);Wheelchair (measurements PT) (will need further assessment post acute or with progress)    Recommendations for Other Services       Precautions / Restrictions Precautions Precautions: Fall Restrictions Weight Bearing Restrictions: No      Mobility  Bed Mobility Overal bed mobility: Needs Assistance Bed Mobility: Supine to Sit;Sit to Supine     Supine to sit: Mod assist;HOB elevated Sit to supine: Max assist;+2 for physical assistance   General bed mobility comments: Requring increased time, assist for legs and trunk with transfers    Transfers Overall  transfer level: Needs assistance Equipment used: 2 person hand held assist Transfers: Sit to/from Stand Sit to Stand: Mod assist;+2 physical assistance Stand pivot transfers: Mod assist (A for weight shifts to advance legs)          Ambulation/Gait Ambulation/Gait assistance: Mod assist;+2 physical assistance Gait Distance (Feet): 6 Feet Assistive device: 2 person hand held assist Gait Pattern/deviations: Step-to pattern;Decreased stride length;Shuffle Gait velocity: decreased   General Gait Details: Pt with very short step length and shuffle gait.  Pt with difficulty advancing bil LE requiring assist to weight shift and verbal cues for sequencing.  Stairs            Wheelchair Mobility    Modified Rankin (Stroke Patients Only)       Balance Overall balance assessment: Needs assistance Sitting-balance support: Bilateral upper extremity supported;Feet supported Sitting balance-Leahy Scale: Poor Sitting balance - Comments: requiring UE support Postural control: Posterior lean (able to correct with cues) Standing balance support: Bilateral upper extremity supported Standing balance-Leahy Scale: Poor Standing balance comment: requiring bil UE support from therapist                             Pertinent Vitals/Pain Pain Assessment: No/denies pain    Home Living Family/patient expects to be discharged to:: Skilled nursing facility Living Arrangements: Children Available Help at Discharge: Family;Available PRN/intermittently Type of Home: House Home Access: Stairs to enter Entrance Stairs-Rails: None Entrance Stairs-Number of Steps: 4 Home Layout: One level Home Equipment: Walker - standard Additional Comments: dtr that she lives with works days    Prior  Function Level of Independence: Independent         Comments: Does not use AD     Hand Dominance   Dominant Hand: Right    Extremity/Trunk Assessment   Upper Extremity Assessment Upper  Extremity Assessment: Defer to OT evaluation RUE Deficits / Details: h/o of CVA pta, movements are sluggish, AROM at shoulder is ~30 degrees (PROM beyond this pt reports as painful) RUE Coordination: decreased fine motor;decreased gross motor    Lower Extremity Assessment Lower Extremity Assessment: LLE deficits/detail;RLE deficits/detail RLE Deficits / Details: ROM WFL; MMT 4/5 throughout RLE Coordination: decreased gross motor LLE Deficits / Details: ROM WFL; MMT 4/5 throughout LLE Coordination: decreased gross motor    Cervical / Trunk Assessment Cervical / Trunk Assessment: Normal  Communication   Communication:  (slow to respond at times)  Cognition Arousal/Alertness: Awake/alert Behavior During Therapy: Flat affect Overall Cognitive Status: Impaired/Different from baseline                                 General Comments: Slow to answer questions at times and slow to move, was A&O x4      General Comments General comments (skin integrity, edema, etc.): VSS on RA    Exercises     Assessment/Plan    PT Assessment Patient needs continued PT services  PT Problem List Decreased strength;Decreased mobility;Decreased safety awareness;Decreased coordination;Decreased activity tolerance;Decreased balance;Decreased knowledge of use of DME       PT Treatment Interventions DME instruction;Therapeutic activities;Gait training;Therapeutic exercise;Patient/family education;Stair training;Balance training;Wheelchair mobility training;Functional mobility training    PT Goals (Current goals can be found in the Care Plan section)  Acute Rehab PT Goals Patient Stated Goal: to get stronger again so I can walk by myself again PT Goal Formulation: With patient/family Time For Goal Achievement: 06/07/21 Potential to Achieve Goals: Good    Frequency Min 3X/week   Barriers to discharge Decreased caregiver support      Co-evaluation               AM-PAC PT "6  Clicks" Mobility  Outcome Measure Help needed turning from your back to your side while in a flat bed without using bedrails?: A Lot Help needed moving from lying on your back to sitting on the side of a flat bed without using bedrails?: Total Help needed moving to and from a bed to a chair (including a wheelchair)?: Total Help needed standing up from a chair using your arms (e.g., wheelchair or bedside chair)?: Total Help needed to walk in hospital room?: Total Help needed climbing 3-5 steps with a railing? : Total 6 Click Score: 7    End of Session Equipment Utilized During Treatment: Gait belt Activity Tolerance: Patient tolerated treatment well Patient left: in bed;with call bell/phone within reach;with family/visitor present Nurse Communication: Mobility status PT Visit Diagnosis: Unsteadiness on feet (R26.81);Other abnormalities of gait and mobility (R26.89);Muscle weakness (generalized) (M62.81)    Time: 7209-4709 PT Time Calculation (min) (ACUTE ONLY): 22 min   Charges:   PT Evaluation $PT Eval Moderate Complexity: 1 Melina Schools, PT Acute Rehab Services Pager [phone] [facility] [phone]   Karlton Lemon 05/24/2021, 12:12 PM

## 2021-05-24 NOTE — ED Notes (Addendum)
Daughter called and was updated on patient current status.

## 2021-05-24 NOTE — ED Notes (Signed)
MRI notified patient is ready.

## 2021-05-24 NOTE — Evaluation (Signed)
Occupational Therapy Evaluation Patient Details Name: [name] [name] MRN: [id number] DOB: 1946/01/07 Today's Date: 05/24/2021    History of Present Illness [name] [name] is a 75 y.o. female who presents with concerns of not being able to walk, CT was negative. Covid +PHMX: CVA with residual right hemiparesis, metastatic breast cancer on palliative chemotherapy, carotid stenosis s/p left CEA, hypertension.   Clinical Impression   This 75 yo female admitted with above presents to acute OT with PLOF per her report of being independent with bed mobility and ambulation without AD as well as being able to do her own basic ADLs. Currently she is Mod-MAx A in and OOB and Mod A for transfers as well as Mod A-total A for basic ADLs. She will continue to benefit from acute OT with follow up at SNF as the current recommendation.    Follow Up Recommendations  SNF;Supervision/Assistance - 24 hour    Equipment Recommendations  Other (comment) (TBD next venue)       Precautions / Restrictions Precautions Precautions: Fall Restrictions Weight Bearing Restrictions: No      Mobility Bed Mobility Overal bed mobility: Needs Assistance Bed Mobility: Supine to Sit;Sit to Supine     Supine to sit: Max assist;HOB elevated (A for legs and trunk and to scoot around to edge of stretcher) Sit to supine: Mod assist   General bed mobility comments: A for legs and to get straight in bed    Transfers Overall transfer level: Needs assistance Equipment used: 1 person hand held assist (therapist stading in front of her with her having hands on therapists arms) Transfers: Sit to/from [employer] Sit to Stand: Mod assist Stand pivot transfers: Mod assist (A for weight shifts to advance legs)            Balance Overall balance assessment: Needs assistance Sitting-balance support: Bilateral upper extremity supported;Feet supported Sitting balance-Leahy Scale: Poor   Postural control:  Posterior lean (VCs to stay forward) Standing balance support: Bilateral upper extremity supported Standing balance-Leahy Scale: Poor                             ADL either performed or assessed with clinical judgement   ADL Overall ADL's : Needs assistance/impaired Eating/Feeding: NPO   Grooming: Moderate assistance;Sitting Grooming Details (indicate cue type and reason): in chair Upper Body Bathing: Maximal assistance;Sitting Upper Body Bathing Details (indicate cue type and reason): in chair Lower Body Bathing: Total assistance Lower Body Bathing Details (indicate cue type and reason): Mod A sit<>stand Upper Body Dressing : Maximal assistance;Sitting Upper Body Dressing Details (indicate cue type and reason): in chair Lower Body Dressing: Total assistance Lower Body Dressing Details (indicate cue type and reason): Mod A sit<>stand Toilet Transfer: Moderate assistance;Stand-pivot Toilet Transfer Details (indicate cue type and reason): Stretcher<>chair Toileting- Clothing Manipulation and Hygiene: Total assistance Toileting - Clothing Manipulation Details (indicate cue type and reason): Mod A sit<>stand             Vision Patient Visual Report: No change from baseline                  Pertinent Vitals/Pain Pain Assessment: No/denies pain     Hand Dominance Right   Extremity/Trunk Assessment Upper Extremity Assessment Upper Extremity Assessment: RUE deficits/detail RUE Deficits / Details: h/o of CVA pta, movements are sluggish, AROM at shoulder is ~30 degrees (PROM beyond this pt reports as painful) RUE Coordination: decreased fine motor;decreased gross motor  Communication Communication Communication:  (slow to respond at times)   Cognition Arousal/Alertness: Awake/alert Behavior During Therapy: Flat affect Overall Cognitive Status: No family/caregiver present to determine baseline cognitive functioning                                  General Comments: Slow to answer questions at times and slow to move, was A&O x4              Home Living Family/patient expects to be discharged to:: Skilled nursing facility Living Arrangements: Children Available Help at Discharge: Family;Available PRN/intermittently Type of Home: House Home Access: Stairs to enter [employer] of Steps: 4 Entrance Stairs-Rails: None Home Layout: One level     Bathroom Shower/Tub: Teacher, early years/pre: Standard     Home Equipment: Environmental consultant - standard   Additional Comments: dtr that she lives with works days      Prior Functioning/Environment Level of Independence: Independent                 OT Problem List: Decreased strength;Decreased range of motion;Impaired balance (sitting and/or standing);Decreased coordination;Decreased safety awareness;Impaired UE functional use;Obesity      OT Treatment/Interventions: Self-care/ADL training;DME and/or AE instruction;Patient/family education;Balance training    OT Goals(Current goals can be found in the care plan section) Acute Rehab OT Goals Patient Stated Goal: to get stronger again so I can walk by myself again OT Goal Formulation: With patient Time For Goal Achievement: 06/07/21 Potential to Achieve Goals: Good  OT Frequency: Min 2X/week   Barriers to D/C: Decreased caregiver support       AM-PAC OT "6 Clicks" Daily Activity     Outcome Measure Help from another person eating meals?: Total Help from another person taking care of personal grooming?: A Lot Help from another person toileting, which includes using toliet, bedpan, or urinal?: Total Help from another person bathing (including washing, rinsing, drying)?: A Lot Help from another person to put on and taking off regular upper body clothing?: A Lot Help from another person to put on and taking off regular lower body clothing?: Total 6 Click Score: 9   End of Session Equipment  Utilized During Treatment: Gait belt  Activity Tolerance: Patient tolerated treatment well Patient left: in bed;with call bell/phone within reach (both rails on stretcher up)  OT Visit Diagnosis: Unsteadiness on feet (R26.81);Other abnormalities of gait and mobility (R26.89);Muscle weakness (generalized) (M62.81);Other symptoms and signs involving cognitive function;Hemiplegia and hemiparesis Hemiplegia - Right/Left: Right Hemiplegia - dominant/non-dominant: Dominant Hemiplegia - caused by: Cerebral infarction                Time: 2563-8937 OT Time Calculation (min): 36 min Charges:  OT General Charges $OT Visit: 1 Visit OT Evaluation $OT Eval Moderate Complexity: 1 Mod OT Treatments $Self Care/Home Management : 8-22 mins  [place] [address], OTR/L Acute [employer] Pager [phone] Office [phone]    Almon Register 05/24/2021, 10:16 AM

## 2021-05-24 NOTE — ED Notes (Signed)
Patient's home medication Xeloda was given to ED pharmacy Tech for packaging by hospital pharmacy.

## 2021-05-24 NOTE — ED Notes (Signed)
Covid + per micro  Provider notified

## 2021-05-25 ENCOUNTER — Other Ambulatory Visit (HOSPITAL_COMMUNITY): Payer: Self-pay

## 2021-05-25 DIAGNOSIS — R531 Weakness: Secondary | ICD-10-CM | POA: Diagnosis not present

## 2021-05-25 DIAGNOSIS — Z170 Estrogen receptor positive status [ER+]: Secondary | ICD-10-CM | POA: Diagnosis not present

## 2021-05-25 DIAGNOSIS — Z90710 Acquired absence of both cervix and uterus: Secondary | ICD-10-CM | POA: Diagnosis not present

## 2021-05-25 DIAGNOSIS — I69351 Hemiplegia and hemiparesis following cerebral infarction affecting right dominant side: Secondary | ICD-10-CM | POA: Diagnosis not present

## 2021-05-25 DIAGNOSIS — N183 Chronic kidney disease, stage 3 unspecified: Secondary | ICD-10-CM | POA: Diagnosis present

## 2021-05-25 DIAGNOSIS — U071 COVID-19: Secondary | ICD-10-CM | POA: Diagnosis present

## 2021-05-25 DIAGNOSIS — C50811 Malignant neoplasm of overlapping sites of right female breast: Secondary | ICD-10-CM | POA: Diagnosis present

## 2021-05-25 DIAGNOSIS — R2689 Other abnormalities of gait and mobility: Secondary | ICD-10-CM | POA: Diagnosis not present

## 2021-05-25 DIAGNOSIS — Z9221 Personal history of antineoplastic chemotherapy: Secondary | ICD-10-CM | POA: Diagnosis not present

## 2021-05-25 DIAGNOSIS — F419 Anxiety disorder, unspecified: Secondary | ICD-10-CM | POA: Diagnosis present

## 2021-05-25 DIAGNOSIS — K219 Gastro-esophageal reflux disease without esophagitis: Secondary | ICD-10-CM | POA: Diagnosis present

## 2021-05-25 DIAGNOSIS — I10 Essential (primary) hypertension: Secondary | ICD-10-CM | POA: Diagnosis not present

## 2021-05-25 DIAGNOSIS — Z7401 Bed confinement status: Secondary | ICD-10-CM | POA: Diagnosis not present

## 2021-05-25 DIAGNOSIS — M6259 Muscle wasting and atrophy, not elsewhere classified, multiple sites: Secondary | ICD-10-CM | POA: Diagnosis not present

## 2021-05-25 DIAGNOSIS — I129 Hypertensive chronic kidney disease with stage 1 through stage 4 chronic kidney disease, or unspecified chronic kidney disease: Secondary | ICD-10-CM | POA: Diagnosis present

## 2021-05-25 DIAGNOSIS — N1831 Chronic kidney disease, stage 3a: Secondary | ICD-10-CM

## 2021-05-25 DIAGNOSIS — Z6836 Body mass index (BMI) 36.0-36.9, adult: Secondary | ICD-10-CM | POA: Diagnosis not present

## 2021-05-25 DIAGNOSIS — E669 Obesity, unspecified: Secondary | ICD-10-CM

## 2021-05-25 DIAGNOSIS — R918 Other nonspecific abnormal finding of lung field: Secondary | ICD-10-CM | POA: Diagnosis present

## 2021-05-25 DIAGNOSIS — N179 Acute kidney failure, unspecified: Secondary | ICD-10-CM | POA: Diagnosis present

## 2021-05-25 DIAGNOSIS — Z79891 Long term (current) use of opiate analgesic: Secondary | ICD-10-CM | POA: Diagnosis not present

## 2021-05-25 DIAGNOSIS — Z7902 Long term (current) use of antithrombotics/antiplatelets: Secondary | ICD-10-CM | POA: Diagnosis not present

## 2021-05-25 DIAGNOSIS — M6281 Muscle weakness (generalized): Secondary | ICD-10-CM | POA: Diagnosis not present

## 2021-05-25 DIAGNOSIS — R2681 Unsteadiness on feet: Secondary | ICD-10-CM | POA: Diagnosis not present

## 2021-05-25 DIAGNOSIS — R41841 Cognitive communication deficit: Secondary | ICD-10-CM | POA: Diagnosis not present

## 2021-05-25 DIAGNOSIS — Z79899 Other long term (current) drug therapy: Secondary | ICD-10-CM | POA: Diagnosis not present

## 2021-05-25 DIAGNOSIS — R1312 Dysphagia, oropharyngeal phase: Secondary | ICD-10-CM | POA: Diagnosis not present

## 2021-05-25 DIAGNOSIS — R7989 Other specified abnormal findings of blood chemistry: Secondary | ICD-10-CM | POA: Diagnosis not present

## 2021-05-25 DIAGNOSIS — G9341 Metabolic encephalopathy: Secondary | ICD-10-CM | POA: Diagnosis present

## 2021-05-25 DIAGNOSIS — A419 Sepsis, unspecified organism: Secondary | ICD-10-CM | POA: Diagnosis not present

## 2021-05-25 DIAGNOSIS — A4189 Other specified sepsis: Secondary | ICD-10-CM | POA: Diagnosis present

## 2021-05-25 DIAGNOSIS — E785 Hyperlipidemia, unspecified: Secondary | ICD-10-CM | POA: Diagnosis present

## 2021-05-25 MED ORDER — ENOXAPARIN SODIUM 60 MG/0.6ML IJ SOSY
50.0000 mg | PREFILLED_SYRINGE | INTRAMUSCULAR | Status: DC
  Administered 2021-05-26 – 2021-05-29 (×4): 50 mg via SUBCUTANEOUS
  Filled 2021-05-25 (×4): qty 0.6

## 2021-05-25 MED ORDER — LIP MEDEX EX OINT
TOPICAL_OINTMENT | CUTANEOUS | Status: DC | PRN
  Filled 2021-05-25: qty 7

## 2021-05-25 NOTE — Progress Notes (Signed)
Ok to increase Lovenox dose to 50mg  SQ qday per Dr. [place] Pink.  Onnie Boer, PharmD, BCIDP, AAHIVP, CPP Infectious Disease Pharmacist 05/25/2021 10:39 AM

## 2021-05-25 NOTE — NC FL2 (Signed)
[place] LEVEL OF CARE SCREENING TOOL     IDENTIFICATION  Patient Name: [name] [name] Birthdate: Nov 21, 1945 Sex: female Admission Date (Current Location): 05/23/2021  [facility] and [place] Number:  Herbalist and Address:  [facility], [place] [address], [place], [place] [address]      Provider Number: [id number]  Attending Physician Name and Address:  Annita Brod, MD  Relative Name and Phone Number:  [name], daughter, [phone]    Current Level of Care: Hospital Recommended Level of Care: [place] Prior Approval Number:    Date Approved/Denied:   PASRR Number: [id number]  Discharge Plan: SNF    Current Diagnoses: Patient Active Problem List   Diagnosis Date Noted   Obesity (BMI 30-39.9) 05/25/2021   Weakness 05/24/2021   History of CVA (cerebrovascular accident) 05/24/2021   CKD (chronic kidney disease) stage 3, GFR 30-59 ml/min ([place]) 05/24/2021   Sepsis ([place]) 05/24/2021   COVID-19 virus infection 05/24/2021   Goals of care, counseling/discussion 05/12/2019   Pain due to onychomycosis of toenails of both feet 05/07/2019   [place] durum 05/07/2019   Coagulation disorder ([place]) 05/07/2019   Multiple lung nodules 01/11/2019   Port-A-Cath in place 10/09/2018   Inflammatory breast cancer, right ([place]) 08/26/2018   Malignant neoplasm of overlapping sites of right breast in female, estrogen receptor positive ([place]) 08/24/2018   Glucose intolerance (impaired glucose tolerance) 05/01/2017   Depression 05/01/2017   Hypertensive crisis    Acute bilat watershed infarction [facility]) 04/09/2017   Intracranial vascular stenosis    Hemiparesis affecting dominant side as late effect of stroke (HCC)    Post-operative pain    S/P carotid endarterectomy    Essential hypertension    Stenosis of left carotid artery    Hyperlipidemia    Gait disturbance, post-stroke 04/02/2017   Right arm weakness    Slowness and poor  responsiveness    Hypertensive emergency 04/01/2017   Hypokalemia 04/01/2017   Hyperglycemia 04/01/2017   Stroke-like symptoms 04/01/2017   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 ([place]) 04/01/2017    Orientation RESPIRATION BLADDER Height & Weight     Self, Time, Situation, Place  Normal Continent, External catheter Weight: 226 lb 13.7 oz (102.9 kg) Height:  5\' 9"  (175.3 cm)  BEHAVIORAL SYMPTOMS/MOOD NEUROLOGICAL BOWEL NUTRITION STATUS      Continent Diet (Please see DC Summary)  AMBULATORY STATUS COMMUNICATION OF NEEDS Skin   Extensive Assist Verbally Other (Comment) (Malignant neoplasm of right breast wound w/foam dressing)                       Personal Care Assistance Level of Assistance  Bathing, Feeding, Dressing Bathing Assistance: Maximum assistance Feeding assistance: Limited assistance Dressing Assistance: Maximum assistance     Functional Limitations Info             SPECIAL CARE FACTORS FREQUENCY  PT (By licensed PT), OT (By licensed OT)     PT Frequency: 5x/week OT Frequency: 5x/week            Contractures Contractures Info: Not present    Additional Factors Info  Code Status, Allergies, Isolation Precautions Code Status Info: Full Allergies Info: NKA     Isolation Precautions Info: COVID+ 05/23/21     Current Medications (05/25/2021):  This is the current hospital active medication list Current Facility-Administered Medications  Medication Dose Route Frequency Provider Last Rate Last Admin    stroke:  mapping our early stages of recovery book   Does not apply Once Tu, Ching T, DO       acetaminophen (TYLENOL) tablet 650 mg  650 mg Oral Q4H PRN Tu, Ching T, DO   650 mg at 05/25/21 0020   Or   acetaminophen (TYLENOL) 160 MG/5ML solution 650 mg  650 mg Per Tube Q4H PRN Tu, Ching T, DO       Or   acetaminophen (TYLENOL) suppository 650 mg  650 mg Rectal Q4H PRN Tu, Ching T, DO       amLODipine (NORVASC) tablet  10 mg  10 mg Oral Daily Tu, Ching T, DO   10 mg at 05/25/21 1007   atorvastatin (LIPITOR) tablet 80 mg  80 mg Oral QPM Tu, Ching T, DO   80 mg at 05/24/21 1816   capecitabine (XELODA) tablet 1,000 mg  1,000 mg Oral Daily Tu, Ching T, DO   1,000 mg at 05/25/21 1439   Chlorhexidine Gluconate Cloth 2 % PADS 6 each  6 each Topical Daily Tu, Ching T, DO   6 each at 05/25/21 1008   clopidogrel (PLAVIX) tablet 75 mg  75 mg Oral Daily Tu, Ching T, DO   75 mg at 05/25/21 1007   [START ON 05/26/2021] enoxaparin (LOVENOX) injection 50 mg  50 mg Subcutaneous Q24H Pham, Minh Q, RPH-CPP       hydrALAZINE (APRESOLINE) injection 5 mg  5 mg Intravenous Q6H PRN Tu, Ching T, DO       lip balm (CARMEX) ointment   Topical PRN Annita Brod, MD       remdesivir 100 mg in sodium chloride 0.9 % 100 mL IVPB  100 mg Intravenous Daily Laren Everts, RPH 200 mL/hr at 05/25/21 1013 100 mg at 05/25/21 1013   senna-docusate (Senokot-S) tablet 1 tablet  1 tablet Oral QHS PRN Tu, Ching T, DO       sodium chloride flush (NS) 0.9 % injection 3 mL  3 mL Intravenous Once Lucrezia Starch, MD         Discharge Medications: Please see discharge summary for a list of discharge medications.  Relevant Imaging Results:  Relevant Lab Results:   Additional Information SSN: [id number]. Pfizer COVID-19 Vaccine 04/11/2020 , 03/21/2020. Patient is taking Xeloda (a chemo drug) by mouth from home supply.  Benard Halsted, LCSW

## 2021-05-25 NOTE — Progress Notes (Signed)
Occupational Therapy Treatment Patient Details Name: [name] [name] MRN: [id number] DOB: 1946/02/11 Today's Date: 05/25/2021    History of present illness [name] [name] is a 75 y.o. female who presents with concerns of not being able to walk on 05/23/21.  COVID was +.  Pt admitted with sepsis secondary to [place].  CT Head negative for acute infarct.  MRI is pending.  PHMX: CVA with residual right hemiparesis, metastatic breast cancer on palliative chemotherapy, carotid stenosis s/p left CEA, hypertension.   OT comments  Pt progressing toward established goals. Pt currently requires maxA+2 for bed mobility and minguard for sitting EOB while completing grooming. Pt required modA+2 to stand from EOB and minA+2 for lateral side stepping at RW level in preparation for further mobility. Pt will continue to benefit from skilled OT services to maximize safety and independence with ADL/IADL and functional mobility. Will continue to follow acutely and progress as tolerated.       Follow Up Recommendations  SNF;Supervision/Assistance - 24 hour    Equipment Recommendations  Other (comment) (TBD next venue)    Recommendations for Other Services      Precautions / Restrictions Precautions Precautions: Fall Restrictions Weight Bearing Restrictions: No       Mobility Bed Mobility Overal bed mobility: Needs Assistance Bed Mobility: Supine to Sit;Sit to Supine     Supine to sit: HOB elevated;Max assist;+2 for physical assistance;+2 for safety/equipment Sit to supine: Max assist;+2 for physical assistance   General bed mobility comments: maxA for BLE and for trunk progression, cues for forward flexion at th hips    Transfers Overall transfer level: Needs assistance Equipment used: Rolling walker (2 wheeled) Transfers: Sit to/from Stand Sit to Stand: Mod assist;+2 physical assistance;+2 safety/equipment         General transfer comment: modA+2 to powerup into standing from EOB. pt then  required minA+2 for side stepping along EOB for preparation of further mobility    Balance Overall balance assessment: Needs assistance Sitting-balance support: Bilateral upper extremity supported;Feet supported Sitting balance-Leahy Scale: Poor Sitting balance - Comments: requiring UE support Postural control: Posterior lean (able to correct with tactile cues) Standing balance support: Bilateral upper extremity supported Standing balance-Leahy Scale: Poor Standing balance comment: requiring bil UE support and support from therapist                           ADL either performed or assessed with clinical judgement   ADL Overall ADL's : Needs assistance/impaired     Grooming: Min guard;Sitting;Oral care;Wash/dry face Grooming Details (indicate cue type and reason): sitting EOB, minguard for safety pt with posterior bias             Lower Body Dressing: Maximal assistance;Sit to/from stand Lower Body Dressing Details (indicate cue type and reason): maxA sit<>stand this date Toilet Transfer: Moderate assistance;+2 for safety/equipment;+2 for physical assistance;RW Toilet Transfer Details (indicate cue type and reason): side stepping along EOb         Functional mobility during ADLs: Moderate assistance;Rolling walker General ADL Comments: Pt limited by decreased activity tolerance, generalized weakness, instability     Vision       Perception     Praxis      Cognition Arousal/Alertness: Awake/alert Behavior During Therapy: Flat affect Overall Cognitive Status: Impaired/Different from baseline Area of Impairment: Memory;Following commands;Safety/judgement;Problem solving                     Memory: Decreased short-term  memory Following Commands: Follows one step commands with increased time;Follows one step commands inconsistently Safety/Judgement: Decreased awareness of safety;Decreased awareness of deficits   Problem Solving: Slow  processing;Difficulty sequencing General Comments: slow to answer at time, pt oriented but demonstrated limitations with problem solving bed mobilityrequiring multimodal cues, increased cues for initiating. unsure pt's baseline        Exercises     Shoulder Instructions       General Comments HR up to 121bpm with standing EOB    Pertinent Vitals/ Pain       Pain Assessment: No/denies pain  Home Living                                          Prior Functioning/Environment              Frequency  Min 2X/week        Progress Toward Goals  OT Goals(current goals can now be found in the care plan section)  Progress towards OT goals: Progressing toward goals  Acute Rehab OT Goals Patient Stated Goal: to get stronger again so I can walk by myself again OT Goal Formulation: With patient Time For Goal Achievement: 06/07/21 Potential to Achieve Goals: Good ADL Goals Pt Will Perform Grooming: with min guard assist;standing Pt Will Perform Upper Body Bathing: with set-up;with supervision;sitting Pt Will Perform Lower Body Bathing: with min guard assist;sit to/from stand Pt Will Perform Upper Body Dressing: with supervision;with set-up;sitting Pt Will Perform Lower Body Dressing: with min guard assist;sit to/from stand Pt Will Transfer to Toilet: with min guard assist;ambulating;bedside commode (over toielt) Pt Will Perform Toileting - Clothing Manipulation and hygiene: with min guard assist;sit to/from stand Pt/caregiver will Perform Home Exercise Program: Increased ROM;Right Upper extremity;With written HEP provided;With Supervision (A/AAROM) Additional ADL Goal #1: Pt will be S in and OOB for basic ADLs  Plan Discharge plan remains appropriate    Co-evaluation                 AM-PAC OT "6 Clicks" Daily Activity     Outcome Measure   Help from another person eating meals?: Total Help from another person taking care of personal grooming?: A  Little Help from another person toileting, which includes using toliet, bedpan, or urinal?: A Lot Help from another person bathing (including washing, rinsing, drying)?: A Lot Help from another person to put on and taking off regular upper body clothing?: A Lot Help from another person to put on and taking off regular lower body clothing?: A Lot 6 Click Score: 12    End of Session Equipment Utilized During Treatment: Gait belt  OT Visit Diagnosis: Unsteadiness on feet (R26.81);Other abnormalities of gait and mobility (R26.89);Muscle weakness (generalized) (M62.81);Other symptoms and signs involving cognitive function;Hemiplegia and hemiparesis Hemiplegia - Right/Left: Right Hemiplegia - dominant/non-dominant: Dominant Hemiplegia - caused by: Cerebral infarction   Activity Tolerance Patient tolerated treatment well   Patient Left in bed;with call bell/phone within reach (both rails on stretcher up)   Nurse Communication Mobility status        Time: 1430-1503 OT Time Calculation (min): 33 min  Charges: OT General Charges $OT Visit: 1 Visit OT Treatments $Self Care/Home Management : 23-37 mins  Helene Kelp OTR/L Acute Rehabilitation Services Office: [place] 05/25/2021, 3:37 PM

## 2021-05-25 NOTE — Consult Note (Signed)
WOC Nurse Consult Note: Patient receiving care in [facility] [phone] (Covid+) Reason for Consult: Right breast wound Wound type: Malignant neoplasm of right breast, being followed by oncology.  Pressure Injury POA: NA Measurement: 3 cm x 4 cm raised  Wound bed: Raised, pink, red with yellow drainage and surrounding hyperpigmentation and scattered papule appearing areas surrounding this wound and 2 on the left breast. Dressing procedure/placement/frequency: Conservative treatment will include cleaning this area with NS, then apply a Mikus piece a Aquacel Advantage Kellie Simmering # [phone]) over this area, cover with a 4 x 4 and foam dressing. Change the Aquacel daily. This will not heal this wound but will help with the drainage.    Monitor the wound area(s) for worsening of condition such as: Signs/symptoms of infection, increase in size, development of or worsening of odor, development of pain, or increased pain at the affected locations.   Notify the medical team if any of these develop.  Thank you for the consult. [place] nurse will not follow at this time.   Please re-consult the [place] team if needed.  Cathlean Marseilles Tamala Julian, MSN, RN, [place], [place]le Pearl, [facility] Wound Treatment Associate Pager [phone]

## 2021-05-25 NOTE — TOC Initial Note (Signed)
Transition of Care [facility]) - Initial/Assessment Note    Patient Details  Name: [name] [name] MRN: [id number] Date of Birth: 11/30/1945  Transition of [facility]) CM/SW Contact:    Benard Halsted, LCSW Phone Number: 05/25/2021, 2:37 PM  Clinical Narrative:                 CSW received consult regarding SNF placement. CSW attempted to speak with patient via phone but OT was in the room to work with her so patient's daughter spoke with CSW. Patient's daughter reported that she is currently unable to care for patient at their home given patient's current physical needs and fall risk. Patient expressed understanding of PT recommendation and is agreeable to SNF placement at time of discharge. CSW discussed insurance authorization process and provided Medicare SNF ratings list. CSW explained that the only accepting COVID facilities [facility] and possibly [place]) are unable to accept patient before Monday. She stated understanding and will tour the facilities. Patient has received two COVID vaccines. CSW inquiring if SNFs will accept patient if she brings the Xeloda chemo medication from home due to cost.   Skilled Nursing Rehab Facilities-   [email or web address] *Ratings updated quarterly   Ratings out of 5 possible   Name Address  Phone # [place] Inspection Overall  [facility] [address], [place] 5  4 4   [facility]  [address] [place], [place] [phone] 4 2 4 4   [facility] [place], [place] 2 3 1 1   [place], [place] 3 3 4 4   [facility] [address], [place] 3  2 2   [place] 2 2 4 4   [facility] [address], [place] 5 2 2 3   [facility] [address], [place] 4 2 2 2   [place] [facility] [place] 5 2 2 3   [facility] [phone], [place] [phone] 5 1 2 2   [facility] [address], [facility] [phone] 5 2 2 3   [place] [phone] 3 1 1 1      Expected Discharge Plan: Skilled Nursing Facility Barriers to Discharge: SNF Covid   Patient Goals and CMS Choice Patient states their goals for this hospitalization and ongoing recovery are:: Rehab CMS Medicare.gov Compare Post Acute Care list provided to:: Patient Choice offered to / list presented to : Patient, Adult Children  Expected Discharge Plan and Services Expected Discharge Plan: [place] In-house Referral: Clinical Social Work     Living arrangements for the past 2 months: [place]                                      Prior Living Arrangements/Services Living arrangements for the past 2 months: Single Family Home Lives with:: Adult Children Patient language and need for interpreter reviewed:: Yes Do you feel safe going back to the place where you live?: Yes      Need for Family Participation in Patient Care: Yes (Comment) Care giver support system in place?: Yes (comment)   Criminal Activity/Legal Involvement Pertinent to Current Situation/Hospitalization: No - Comment as needed  Activities of Daily Living Home Assistive Devices/Equipment: None ADL Screening (condition at time of admission) Patient's cognitive  ability adequate to safely complete daily activities?: Yes Is the patient deaf or have difficulty hearing?: No Does the patient have difficulty seeing, even when wearing glasses/contacts?: No Does the patient have difficulty concentrating, remembering, or making decisions?: No Patient able to express need for assistance with ADLs?: No Does the patient have difficulty dressing or bathing?: Yes Independently performs ADLs?: Yes (appropriate for  developmental age) Does the patient have difficulty walking or climbing stairs?: Yes Weakness of Legs: Both Weakness of Arms/Hands: Both  Permission Sought/Granted Permission sought to share information with : Facility Sport and exercise psychologist, Family Supports Permission granted to share information with : Yes, Verbal Permission Granted  Share Information with NAME: [name]  Permission granted to share info w AGENCY: SNFs  Permission granted to share info w Relationship: Daughter  Permission granted to share info w Contact Information: [phone]  Emotional Assessment   Attitude/Demeanor/Rapport: Engaged Affect (typically observed): Accepting, Appropriate Orientation: : Oriented to Self, Oriented to Place, Oriented to  Time, Oriented to Situation Alcohol / Substance Use: Not Applicable Psych Involvement: No (comment)  Admission diagnosis:  Weakness [R53.1] Stroke-like symptoms [R29.90] COVID-19 virus infection [U07.1] Patient Active Problem List   Diagnosis Date Noted   Obesity (BMI 30-39.9) 05/25/2021   Weakness 05/24/2021   History of CVA (cerebrovascular accident) 05/24/2021   CKD (chronic kidney disease) stage 3, GFR 30-59 ml/min (HCC) 05/24/2021   Sepsis ([place]) 05/24/2021   COVID-19 virus infection 05/24/2021   Goals of care, counseling/discussion 05/12/2019   Pain due to onychomycosis of toenails of both feet 05/07/2019   [place] durum 05/07/2019   Coagulation disorder ([place]) 05/07/2019   Multiple lung nodules 01/11/2019   Port-A-Cath in place 10/09/2018   Inflammatory breast cancer, right ([place]) 08/26/2018   Malignant neoplasm of overlapping sites of right breast in female, estrogen receptor positive ([place]) 08/24/2018   Glucose intolerance (impaired glucose tolerance) 05/01/2017   Depression 05/01/2017   Hypertensive crisis    Acute bilat watershed infarction [facility]) 04/09/2017   Intracranial vascular stenosis    Hemiparesis affecting dominant side as late effect of stroke  (HCC)    Post-operative pain    S/P carotid endarterectomy    Essential hypertension    Stenosis of left carotid artery    Hyperlipidemia    Gait disturbance, post-stroke 04/02/2017   Right arm weakness    Slowness and poor responsiveness    Hypertensive emergency 04/01/2017   Hypokalemia 04/01/2017   Hyperglycemia 04/01/2017   Stroke-like symptoms 04/01/2017   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 ([place]) 04/01/2017   PCP:  [name], DO Pharmacy:   [place] [address] Phone: [phone] Fax: [phone]  [facility] [place], [place] - [address] [place] [address] Phone: [phone] Fax: [phone]     Social Determinants of Health ([place]) Interventions    Readmission Risk Interventions No flowsheet data found.

## 2021-05-25 NOTE — Progress Notes (Signed)
PROGRESS NOTE  [name] [name] QIW:[id number] DOB: Mar 18, 1946 DOA: 05/23/2021 PCP: [name], DO  HPI/Recap of past 56 hours: 75 year old female with past medical history of CVA with residual right hemiparesis, metastatic breast cancer on palliative chemotherapy, carotid stenosis status post left CEA and hypertension who presented to the emergency department on 7/13 with weakness.  Patient had been in her usual state of health but around lunchtime on day of presentation, unable to get up and required 2 people to assist her back into a chair.  In the emergency room, CT scan of head was unremarkable and findings were noted for mild right lower extremity weakness, which she previously had from previous CVA.  Neurology consulted.  COVID PCR positive which was felt to be likely patient's cause of weakness.  MRI negative for acute CVA.  Patient admitted to the hospitalist service and started on IV Remdisivir.  Seen by PT and OT recommending skilled nursing  Patient fatigued although overall feeling a little bit better today.  Assessment/Plan: Principal Problem:   Sepsis ([place]) secondary to COVID-19 viral infection: Patient met criteria for sepsis given tachypnea and tachycardia from COVID-19 infection.  Fortunately not hypoxic.  Getting IV fluids as well as IV Remdisivir. Active Problems:   Essential hypertension: Continue home medications.    Malignant neoplasm of overlapping sites of right breast in female, estrogen receptor positive [facility]): Appreciate wound care help.  Receiving palliative chemotherapy.    Weakness with previous history of CVA and residual right-sided weakness: Felt to be more from [place].  No evidence of new acute CVA.  Appreciate neurology following.  CTA of head and neck did note severe bilateral siphon stenosis    CKD (chronic kidney disease) stage 3, GFR 30-59 ml/min ([place]): Looks to be at baseline    Obesity (BMI 30-39.9): Meets criteria BMI greater than 30   Code  Status: Full code  Family Communication: Daughter at the bedside  Disposition Plan: PT and OT recommending skilled nursing.   Consultants: Neurology  Procedures: None  Antimicrobials: IV Remdisivir 7/14-present  DVT prophylaxis: Lovenox  Level of care: Progressive   Objective: Vitals:   05/25/21 0427 05/25/21 0801  BP: (!) 132/56 (!) 147/66  Pulse: 91 99  Resp: 16 18  Temp: 99.8 F (37.7 C) 99.4 F (37.4 C)  SpO2: 91% 98%    Intake/Output Summary (Last 24 hours) at 05/25/2021 0929 Last data filed at 05/25/2021 0433 Gross per 24 hour  Intake 0 ml  Output 400 ml  Net -400 ml   Filed Weights   05/23/21 1923 05/24/21 2200  Weight: 102.4 kg 102.9 kg   Body mass index is 33.5 kg/m.  Exam:  General: Alert and oriented x3, no acute distress Cardiovascular: Regular rate and rhythm, S1-S2 Respiratory: Clear to auscultation bilaterally Abdomen: Soft, nontender, nondistended, positive bowel sounds Musculoskeletal: No clubbing or cyanosis or edema Skin: Patient has a malignant neoplasm raised wound on her right breast Psychiatry: Appropriate, no evidence of psychoses Neurology: No focal deficits   Data Reviewed: CBC: Recent Labs  Lab 05/23/21 1925 05/23/21 1936  WBC 8.7  --   NEUTROABS 6.1  --   HGB 10.6* 10.5*  HCT 35.1* 31.0*  MCV 81.1  --   PLT 185  --    Basic Metabolic Panel: Recent Labs  Lab 05/23/21 1925 05/23/21 1936  NA 139 139  K 3.7 3.7  CL 106 106  CO2 23  --   GLUCOSE 138* 134*  BUN 16 15  CREATININE 1.12*  1.10*  CALCIUM 8.9  --    GFR: Estimated Creatinine Clearance: 58.2 mL/min (A) (by C-G formula based on SCr of 1.1 mg/dL (H)). Liver Function Tests: Recent Labs  Lab 05/23/21 1925  AST 20  ALT 18  ALKPHOS 115  BILITOT 0.7  PROT 6.9  ALBUMIN 3.6   No results for input(s): LIPASE, AMYLASE in the last 168 hours. No results for input(s): AMMONIA in the last 168 hours. Coagulation Profile: Recent Labs  Lab  05/23/21 1925  INR 1.1   Cardiac Enzymes: No results for input(s): CKTOTAL, CKMB, CKMBINDEX, TROPONINI in the last 168 hours. BNP (last 3 results) No results for input(s): PROBNP in the last 8760 hours. HbA1C: Recent Labs    05/24/21 0321  HGBA1C 6.5*   CBG: Recent Labs  Lab 05/23/21 1926  GLUCAP 131*   Lipid Profile: Recent Labs    05/24/21 0321  CHOL 127  HDL 48  LDLCALC 73  TRIG 31  CHOLHDL 2.6   Thyroid Function Tests: No results for input(s): TSH, T4TOTAL, FREET4, T3FREE, THYROIDAB in the last 72 hours. Anemia Panel: No results for input(s): VITAMINB12, FOLATE, FERRITIN, TIBC, IRON, RETICCTPCT in the last 72 hours. Urine analysis:    Component Value Date/Time   COLORURINE STRAW (A) 05/23/2021 2014   APPEARANCEUR CLEAR 05/23/2021 2014   LABSPEC 1.035 (H) 05/23/2021 2014   PHURINE 7.0 05/23/2021 2014   GLUCOSEU NEGATIVE 05/23/2021 2014   HGBUR [place] (A) 05/23/2021 2014   [place] NEGATIVE 05/23/2021 2014   [place] NEGATIVE 05/23/2021 2014   PROTEINUR NEGATIVE 05/23/2021 2014   NITRITE NEGATIVE 05/23/2021 2014   LEUKOCYTESUR TRACE (A) 05/23/2021 2014   Sepsis Labs: $[email or web address]$ (procalcitonin:4,lacticidven:4)  ) Recent Results (from the past 240 hour(s))  Resp Panel by RT-PCR (Flu A&B, Covid) Urine, Clean Catch     Status: Abnormal   Collection Time: 05/23/21  9:49 PM   Specimen: Urine, Clean Catch; Nasopharyngeal(NP) swabs in vial transport medium  Result Value Ref Range Status   SARS Coronavirus 2 by RT PCR POSITIVE (A) NEGATIVE Final    Comment: RESULT CALLED TO, READ BACK BY AND VERIFIED WITH: Letta Pate RN, AT [id number] 05/24/21 D. VANHOOK (NOTE) SARS-CoV-2 target nucleic acids are DETECTED.  The SARS-CoV-2 RNA is generally detectable in upper respiratory specimens during the acute phase of infection. Positive results are indicative of the presence of the identified virus, but do not rule out bacterial infection or co-infection with other  pathogens not detected by the test. Clinical correlation with patient history and other diagnostic information is necessary to determine patient infection status. The expected result is Negative.  Fact Sheet for Patients: EntrepreneurPulse.com.au  Fact Sheet for Healthcare Providers: IncredibleEmployment.be  This test is not yet approved or cleared by the [place] FDA and  has been authorized for detection and/or diagnosis of SARS-CoV-2 by FDA under an Emergency Use Authorization (EUA).  This EUA will remain in effect (meaning this tes t can be used) for the duration of  the COVID-19 declaration under Section 564(b)(1) of the Act, 21 U.S.C. section 360bbb-3(b)(1), unless the authorization is terminated or revoked sooner.     Influenza A by PCR NEGATIVE NEGATIVE Final   Influenza B by PCR NEGATIVE NEGATIVE Final    Comment: (NOTE) The Xpert Xpress SARS-CoV-2/FLU/RSV plus assay is intended as an aid in the diagnosis of influenza from Nasopharyngeal swab specimens and should not be used as a sole basis for treatment. Nasal washings and aspirates are unacceptable for Xpert Xpress SARS-CoV-2/FLU/RSV testing.  Fact Sheet for Patients: EntrepreneurPulse.com.au  Fact Sheet for Healthcare Providers: IncredibleEmployment.be  This test is not yet approved or cleared by the [place] FDA and has been authorized for detection and/or diagnosis of SARS-CoV-2 by FDA under an Emergency Use Authorization (EUA). This EUA will remain in effect (meaning this test can be used) for the duration of the COVID-19 declaration under Section 564(b)(1) of the Act, 21 U.S.C. section 360bbb-3(b)(1), unless the authorization is terminated or revoked.  Performed at [facility], [place] [address], [place], [place] [address]       Studies: MR BRAIN W WO CONTRAST  Result Date: 05/24/2021 CLINICAL DATA:  Stroke  follow-up. EXAM: MRI HEAD WITHOUT AND WITH CONTRAST TECHNIQUE: Multiplanar, multiecho pulse sequences of the brain and surrounding structures were obtained without and with intravenous contrast. CONTRAST:  11mL GADAVIST GADOBUTROL 1 MMOL/ML IV SOLN COMPARISON:  CT exams from yesterday. 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 Remote cortical infarcts in the left frontal and left parietal cortex. Remote lacunar infarcts in bilateral corona radiata and basal ganglia, right thalamus, bilateral cerebellar hemispheres, and likely the pons. Additional moderate to advanced patchy white matter T2 hyperintensity, compatible with chronic microvascular ischemic disease. Moderate supratentorial and marked cerebellar atrophy with ex vacuo ventricular dilation. Jury foci of susceptibility artifact in the right thalamus, left frontal cortex, and right temporal lobe, compatible with the sequela of prior microhemorrhage. Prominent pituitary gland with upwardly convex margin, similar when compared to the cervical spine from 2018 and homogeneously enhancing. No evidence of abnormal enhancement on the motion limited postcontrast imaging. No midline shift. No extra-axial fluid collections. No hydrocephalus. Vascular: Major arterial flow voids are maintained at the skull base. Skull and upper cervical spine: Normal marrow signal. Sinuses/Orbits: Sinuses are largely clear.  Unremarkable orbits. Other: No sizable mastoid effusions. IMPRESSION: 1. No evidence of acute intracranial abnormality. Specifically, no acute infarct. 2. Multiple remote infratentorial and supratentorial infarcts (as detailed above) with moderate to severe chronic microvascular ischemic disease. 3. Moderate supratentorial and marked cerebellar atrophy with ex vacuo ventricular dilation. 4. Scattered prior microhemorrhages, detailed above and potentially related to hypertension or amyloid angiopathy. 5. Prominent pituitary  gland with upwardly convex margin, which appears similar to the 2018 mri of the cervical spine. Electronically Signed   By: Margaretha Sheffield MD   On: 05/24/2021 16:57   ECHOCARDIOGRAM COMPLETE  Result Date: 05/24/2021    ECHOCARDIOGRAM REPORT   Patient Name:   [name] [name] Date of Exam: 05/24/2021 Medical Rec #:  [id number]     Height:       66.0 in Accession #:    [id number]    Weight:       225.7 lb Date of Birth:  [birth year]     BSA:          2.105 m Patient Age:    74 years      BP:           160/79 mmHg Patient Gender: F             HR:           100 bpm. Exam Location:  Inpatient Procedure: 2D Echo, Cardiac Doppler, Color Doppler and Intracardiac            Opacification Agent Indications:    Stroke I63.9  History:        Patient has prior history of Echocardiogram examinations, most  recent 04/03/2017. Carotid Disease and Stroke; Risk                 Factors:Hypertension. Metastatic breast cancer.  Sonographer:    [name] RDCS Referring Phys: [id number] [facility]  Sonographer Comments: Technically difficult study due to poor echo windows, suboptimal parasternal window, suboptimal apical window and suboptimal subcostal window. IMPRESSIONS  1. Left ventricular ejection fraction, by estimation, is 65 to 70%. The left ventricle has normal function. Left ventricular endocardial border not optimally defined to evaluate regional wall motion. Left ventricular diastolic parameters are consistent with Grade I diastolic dysfunction (impaired relaxation).  2. Right ventricular systolic function was not well visualized. The right ventricular size is not well visualized. Tricuspid regurgitation signal is inadequate for assessing PA pressure.  3. The mitral valve is normal in structure. No evidence of mitral valve regurgitation. No evidence of mitral stenosis. Moderate mitral annular calcification.  4. The aortic valve is tricuspid. Aortic valve regurgitation is not visualized. Mild to moderate aortic  valve sclerosis/calcification is present, without any evidence of aortic stenosis.  5. The inferior vena cava is normal in size with greater than 50% respiratory variability, suggesting right atrial pressure of 3 mmHg.  6. Technically difficult study with very poor acoustic windows. FINDINGS  Left Ventricle: Left ventricular ejection fraction, by estimation, is 65 to 70%. The left ventricle has normal function. Left ventricular endocardial border not optimally defined to evaluate regional wall motion. Definity contrast agent was given IV to delineate the left ventricular endocardial borders. The left ventricular internal cavity size was normal in size. There is no left ventricular hypertrophy. Left ventricular diastolic parameters are consistent with Grade I diastolic dysfunction (impaired relaxation). Right Ventricle: The right ventricular size is not well visualized. Right vetricular wall thickness was not well visualized. Right ventricular systolic function was not well visualized. Tricuspid regurgitation signal is inadequate for assessing PA pressure. Left Atrium: Left atrial size was not well visualized. Right Atrium: Right atrial size was not well visualized. Pericardium: There is no evidence of pericardial effusion. Mitral Valve: The mitral valve is normal in structure. Moderate mitral annular calcification. No evidence of mitral valve regurgitation. No evidence of mitral valve stenosis. Tricuspid Valve: The tricuspid valve is not well visualized. Tricuspid valve regurgitation is not demonstrated. Aortic Valve: The aortic valve is tricuspid. Aortic valve regurgitation is not visualized. Mild to moderate aortic valve sclerosis/calcification is present, without any evidence of aortic stenosis. Pulmonic Valve: The pulmonic valve was not well visualized. Pulmonic valve regurgitation is not visualized. Aorta: The aortic root is normal in size and structure. Venous: The inferior vena cava is normal in size with  greater than 50% respiratory variability, suggesting right atrial pressure of 3 mmHg. IAS/Shunts: The interatrial septum was not well visualized.  LEFT VENTRICLE PLAX 2D LVOT diam:     1.90 cm  Diastology LV SV:         30       LV e' medial:    9.28 cm/s LV SV Index:   14       LV E/e' medial:  8.8 LVOT Area:     2.84 cm LV e' lateral:   6.81 cm/s                         LV E/e' lateral: 12.0  AORTIC VALVE LVOT Vmax:   75.00 cm/s LVOT Vmean:  51.700 cm/s LVOT VTI:    0.105 m  AORTA Ao Root  diam: 3.05 cm MITRAL VALVE MV Area (PHT): 3.99 cm    SHUNTS MV Decel Time: 190 msec    Systemic VTI:  0.10 m MV E velocity: 82.00 cm/s  Systemic Diam: 1.90 cm MV A velocity: 93.20 cm/s MV E/A ratio:  0.88 Loralie Champagne MD Electronically signed by Loralie Champagne MD Signature Date/Time: 05/24/2021/4:24:14 PM    Final     Scheduled Meds:   stroke: mapping our early stages of recovery book   Does not apply Once   amLODipine  10 mg Oral Daily   atorvastatin  80 mg Oral QPM   capecitabine  1,000 mg Oral Daily   Chlorhexidine Gluconate Cloth  6 each Topical Daily   clopidogrel  75 mg Oral Daily   enoxaparin (LOVENOX) injection  40 mg Subcutaneous Q24H   sodium chloride flush  3 mL Intravenous Once    Continuous Infusions:  remdesivir 100 mg in NS 100 mL       LOS: 0 days     Annita Brod, MD [facility]   05/25/2021, 9:29 AM

## 2021-05-25 NOTE — Progress Notes (Signed)
STROKE TEAM PROGRESS NOTE   SUBJECTIVE (INTERVAL HISTORY) No family at the bedside.  Patient lying in bed, awake alert, in good spirit, no fever this morning.  Coughing improved per patient.  MRI no acute infarct.  Patient felt generalized weakness improved also.   OBJECTIVE Temp:  [98.1 F (36.7 C)-100.7 F (38.2 C)] 98.2 F (36.8 C) (07/15 1523) Pulse Rate:  [91-116] 96 (07/15 1523) Cardiac Rhythm: Normal sinus rhythm (07/15 0700) Resp:  [14-24] 18 (07/15 1523) BP: (118-147)/(56-66) 118/66 (07/15 1523) SpO2:  [91 %-98 %] 92 % (07/15 1523) Weight:  [102.9 kg] 102.9 kg (07/14 2200)  Recent Labs  Lab 05/23/21 1926  GLUCAP 131*   Recent Labs  Lab 05/23/21 1925 05/23/21 1936  NA 139 139  K 3.7 3.7  CL 106 106  CO2 23  --   GLUCOSE 138* 134*  BUN 16 15  CREATININE 1.12* 1.10*  CALCIUM 8.9  --    Recent Labs  Lab 05/23/21 1925  AST 20  ALT 18  ALKPHOS 115  BILITOT 0.7  PROT 6.9  ALBUMIN 3.6   Recent Labs  Lab 05/23/21 1925 05/23/21 1936  WBC 8.7  --   NEUTROABS 6.1  --   HGB 10.6* 10.5*  HCT 35.1* 31.0*  MCV 81.1  --   PLT 185  --    No results for input(s): CKTOTAL, CKMB, CKMBINDEX, TROPONINI in the last 168 hours. Recent Labs    05/23/21 1925  LABPROT 14.0  INR 1.1   Recent Labs    05/23/21 2014  COLORURINE STRAW*  LABSPEC 1.035*  PHURINE 7.0  GLUCOSEU NEGATIVE  HGBUR [place]*  BILIRUBINUR NEGATIVE  KETONESUR NEGATIVE  PROTEINUR NEGATIVE  NITRITE NEGATIVE  LEUKOCYTESUR TRACE*       Component Value Date/Time   CHOL 127 05/24/2021 0321   TRIG 31 05/24/2021 0321   HDL 48 05/24/2021 0321   CHOLHDL 2.6 05/24/2021 0321   VLDL 6 05/24/2021 0321   LDLCALC 73 05/24/2021 0321   Lab Results  Component Value Date   HGBA1C 6.5 (H) 05/24/2021      Component Value Date/Time   LABOPIA NONE DETECTED 04/01/2017 0920   COCAINSCRNUR NONE DETECTED 04/01/2017 0920   LABBENZ NONE DETECTED 04/01/2017 0920   AMPHETMU NONE DETECTED 04/01/2017 0920    THCU NONE DETECTED 04/01/2017 0920   LABBARB NONE DETECTED 04/01/2017 0920    No results for input(s): ETH in the last 168 hours.  I have personally reviewed the radiological images below and agree with the radiology interpretations.  CT Angio Head W or Wo Contrast  Result Date: 05/23/2021 CLINICAL DATA:  Initial evaluation for acute stroke, possible right-sided weakness. EXAM: CT ANGIOGRAPHY HEAD AND NECK CT PERFUSION BRAIN TECHNIQUE: Multidetector CT imaging of the head and neck was performed using the standard protocol during bolus administration of intravenous contrast. Multiplanar CT image reconstructions and MIPs were obtained to evaluate the vascular anatomy. Carotid stenosis measurements (when applicable) are obtained utilizing NASCET criteria, using the distal internal carotid diameter as the denominator. Multiphase CT imaging of the brain was performed following IV bolus contrast injection. Subsequent parametric perfusion maps were calculated using RAPID software. CONTRAST:  171mL OMNIPAQUE IOHEXOL 350 MG/ML SOLN COMPARISON:  Prior head CT from earlier the same day. FINDINGS: CTA NECK FINDINGS Aortic arch: Visualized aortic arch normal caliber with normal branch pattern. Atheromatous change about the arch and origin of the great vessels without hemodynamically significant stenosis. Right carotid system: Right CCA patent from its origin to the  bifurcation without stenosis. Scattered mixed plaque about the right carotid bulb/proximal right ICA without significant stenosis. Right ICA patent distally without stenosis, dissection or occlusion. Left carotid system: Left CCA patent from its origin to the bifurcation without stenosis. Scattered mixed plaque about the left carotid bulb/proximal left ICA without stenosis. Left ICA patent distally without stenosis, dissection or occlusion. Vertebral arteries: Both vertebral arteries arise from the subclavian arteries. No proximal subclavian artery stenosis.  Atheromatous irregularity with multifocal mild-to-moderate stenoses noted throughout the vertebral arteries bilaterally. Vertebral arteries remain patent within the neck without occlusion or other acute vascular abnormality. Skeleton: No visible acute osseous finding. No discrete or worrisome osseous lesions. Other neck: No other acute soft tissue abnormality within the neck. No discrete mass or adenopathy. Upper chest: Infiltrating tumor involving the left breast and upper right chest wall with overlying skin thickening partially visualized. Visualized upper chest demonstrates no other acute finding. Review of the MIP images confirms the above findings CTA HEAD FINDINGS Anterior circulation: Petrous segments patent bilaterally. Atheromatous change throughout the carotid siphons with associated moderate to severe multifocal stenoses. Left A1 segment patent. Right A1 hypoplastic and/or absent, accounting for the diminutive right ICA is compared to the left. Normal anterior communicating artery complex. Atheromatous change throughout the A2/A3 segments with associated moderate multifocal stenoses, worse on the right. M1 segments mildly irregular but patent without high-grade stenosis. Normal MCA bifurcations. Distal MCA branches well perfused and symmetric. Diffuse Balis vessel atheromatous irregularity. Posterior circulation: Atheromatous change about the proximal V4 segments with associated moderate stenosis on the right and more severe focal stenosis on the left (series 4, image 138). Basilar diminutive and irregular but patent to its distal aspect without stenosis. Superior cerebral arteries grossly patent at their origins. Both PCAs primarily supplied via the basilar. Atheromatous irregularity throughout the PCAs bilaterally. Short-segment proximal moderate right P2 stenosis (series 5, image 90). PCAs otherwise patent to their distal aspects without high-grade stenosis. Venous sinuses: Grossly patent allowing for  timing the contrast bolus. Anatomic variants: Hypoplastic/absent right A1. No visible aneurysm. Review of the MIP images confirms the above findings CT Brain Perfusion Findings: ASPECTS: 10 CBF (<30%) Volume: 55mL Perfusion (Tmax>6.0s) volume: 28mL Mismatch Volume: 64mL Infarction Location:Negative CT perfusion with no evidence for acute core infarct or other perfusion deficit. IMPRESSION: 1. Negative CTA for emergent large vessel occlusion. 2. Negative CT perfusion with no evidence for acute core infarct or other perfusion deficit. 3. Extensive atherosclerotic change throughout the intracranial circulation. Associated moderate to severe stenoses throughout the carotid siphons, with moderate to severe bilateral proximal V4 stenoses. 4. Additional scattered atherosclerotic change elsewhere about the major arterial vasculature of the head and neck as above. No other hemodynamically significant or correctable stenosis. 5. 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 These results were communicated to Dr. Cheral Marker at 8:09 pm on 05/23/2021 by text page via the [facility] messaging system. Electronically Signed   By: Jeannine Boga M.D.   On: 05/23/2021 20:39   CT Angio Neck W and/or Wo Contrast  Result Date: 05/23/2021 CLINICAL DATA:  Initial evaluation for acute stroke, possible right-sided weakness. EXAM: CT ANGIOGRAPHY HEAD AND NECK CT PERFUSION BRAIN TECHNIQUE: Multidetector CT imaging of the head and neck was performed using the standard protocol during bolus administration of intravenous contrast. Multiplanar CT image reconstructions and MIPs were obtained to evaluate the vascular anatomy. Carotid stenosis measurements (when applicable) are obtained utilizing NASCET criteria, using the distal internal carotid diameter as the  denominator. Multiphase CT imaging of the brain was performed following IV bolus contrast injection. Subsequent parametric perfusion maps were  calculated using RAPID software. CONTRAST:  157mL OMNIPAQUE IOHEXOL 350 MG/ML SOLN COMPARISON:  Prior head CT from earlier the same day. FINDINGS: CTA NECK FINDINGS Aortic arch: Visualized aortic arch normal caliber with normal branch pattern. Atheromatous change about the arch and origin of the great vessels without hemodynamically significant stenosis. Right carotid system: Right CCA patent from its origin to the bifurcation without stenosis. Scattered mixed plaque about the right carotid bulb/proximal right ICA without significant stenosis. Right ICA patent distally without stenosis, dissection or occlusion. Left carotid system: Left CCA patent from its origin to the bifurcation without stenosis. Scattered mixed plaque about the left carotid bulb/proximal left ICA without stenosis. Left ICA patent distally without stenosis, dissection or occlusion. Vertebral arteries: Both vertebral arteries arise from the subclavian arteries. No proximal subclavian artery stenosis. Atheromatous irregularity with multifocal mild-to-moderate stenoses noted throughout the vertebral arteries bilaterally. Vertebral arteries remain patent within the neck without occlusion or other acute vascular abnormality. Skeleton: No visible acute osseous finding. No discrete or worrisome osseous lesions. Other neck: No other acute soft tissue abnormality within the neck. No discrete mass or adenopathy. Upper chest: Infiltrating tumor involving the left breast and upper right chest wall with overlying skin thickening partially visualized. Visualized upper chest demonstrates no other acute finding. Review of the MIP images confirms the above findings CTA HEAD FINDINGS Anterior circulation: Petrous segments patent bilaterally. Atheromatous change throughout the carotid siphons with associated moderate to severe multifocal stenoses. Left A1 segment patent. Right A1 hypoplastic and/or absent, accounting for the diminutive right ICA is compared to the  left. Normal anterior communicating artery complex. Atheromatous change throughout the A2/A3 segments with associated moderate multifocal stenoses, worse on the right. M1 segments mildly irregular but patent without high-grade stenosis. Normal MCA bifurcations. Distal MCA branches well perfused and symmetric. Diffuse [place] vessel atheromatous irregularity. Posterior circulation: Atheromatous change about the proximal V4 segments with associated moderate stenosis on the right and more severe focal stenosis on the left (series 4, image 138). Basilar diminutive and irregular but patent to its distal aspect without stenosis. Superior cerebral arteries grossly patent at their origins. Both PCAs primarily supplied via the basilar. Atheromatous irregularity throughout the PCAs bilaterally. Short-segment proximal moderate right P2 stenosis (series 5, image 90). PCAs otherwise patent to their distal aspects without high-grade stenosis. Venous sinuses: Grossly patent allowing for timing the contrast bolus. Anatomic variants: Hypoplastic/absent right A1. No visible aneurysm. Review of the MIP images confirms the above findings CT Brain Perfusion Findings: ASPECTS: 10 CBF (<30%) Volume: 71mL Perfusion (Tmax>6.0s) volume: 62mL Mismatch Volume: 36mL Infarction Location:Negative CT perfusion with no evidence for acute core infarct or other perfusion deficit. IMPRESSION: 1. Negative CTA for emergent large vessel occlusion. 2. Negative CT perfusion with no evidence for acute core infarct or other perfusion deficit. 3. Extensive atherosclerotic change throughout the intracranial circulation. Associated moderate to severe stenoses throughout the carotid siphons, with moderate to severe bilateral proximal V4 stenoses. 4. Additional scattered atherosclerotic change elsewhere about the major arterial vasculature of the head and neck as above. No other hemodynamically significant or correctable stenosis. 5. Infiltrating tumor involving the  right breast and upper left chest wall, partially visualized. These results were communicated to Dr. Cheral Marker at 8:09 pm on 05/23/2021 by text page via the [facility] messaging system. Electronically Signed   By: Jeannine Boga M.D.   On: 05/23/2021 20:39  MR BRAIN W WO CONTRAST  Result Date: 05/24/2021 CLINICAL DATA:  Stroke follow-up. EXAM: MRI HEAD WITHOUT AND WITH CONTRAST TECHNIQUE: Multiplanar, multiecho pulse sequences of the brain and surrounding structures were obtained without and with intravenous contrast. CONTRAST:  43mL GADAVIST GADOBUTROL 1 MMOL/ML IV SOLN COMPARISON:  CT exams from yesterday. 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 Remote cortical infarcts in the left frontal and left parietal cortex. Remote lacunar infarcts in bilateral corona radiata and basal ganglia, right thalamus, bilateral cerebellar hemispheres, and likely the pons. Additional moderate to advanced patchy white matter T2 hyperintensity, compatible with chronic microvascular ischemic disease. Moderate supratentorial and marked cerebellar atrophy with ex vacuo ventricular dilation. Fox foci of susceptibility artifact in the right thalamus, left frontal cortex, and right temporal lobe, compatible with the sequela of prior microhemorrhage. Prominent pituitary gland with upwardly convex margin, similar when compared to the cervical spine from 2018 and homogeneously enhancing. No evidence of abnormal enhancement on the motion limited postcontrast imaging. No midline shift. No extra-axial fluid collections. No hydrocephalus. Vascular: Major arterial flow voids are maintained at the skull base. Skull and upper cervical spine: Normal marrow signal. Sinuses/Orbits: Sinuses are largely clear.  Unremarkable orbits. Other: No sizable mastoid effusions. IMPRESSION: 1. No evidence of acute intracranial abnormality. Specifically, no acute infarct. 2. Multiple remote infratentorial and  supratentorial infarcts (as detailed above) with moderate to severe chronic microvascular ischemic disease. 3. Moderate supratentorial and marked cerebellar atrophy with ex vacuo ventricular dilation. 4. Scattered prior microhemorrhages, detailed above and potentially related to hypertension or amyloid angiopathy. 5. Prominent pituitary gland with upwardly convex margin, which appears similar to the 2018 mri of the cervical spine. Electronically Signed   By: Margaretha Sheffield MD   On: 05/24/2021 16:57   CT CEREBRAL PERFUSION W CONTRAST  Result Date: 05/23/2021 CLINICAL DATA:  Initial evaluation for acute stroke, possible right-sided weakness. EXAM: CT ANGIOGRAPHY HEAD AND NECK CT PERFUSION BRAIN TECHNIQUE: Multidetector CT imaging of the head and neck was performed using the standard protocol during bolus administration of intravenous contrast. Multiplanar CT image reconstructions and MIPs were obtained to evaluate the vascular anatomy. Carotid stenosis measurements (when applicable) are obtained utilizing NASCET criteria, using the distal internal carotid diameter as the denominator. Multiphase CT imaging of the brain was performed following IV bolus contrast injection. Subsequent parametric perfusion maps were calculated using RAPID software. CONTRAST:  149mL OMNIPAQUE IOHEXOL 350 MG/ML SOLN COMPARISON:  Prior head CT from earlier the same day. FINDINGS: CTA NECK FINDINGS Aortic arch: Visualized aortic arch normal caliber with normal branch pattern. Atheromatous change about the arch and origin of the great vessels without hemodynamically significant stenosis. Right carotid system: Right CCA patent from its origin to the bifurcation without stenosis. Scattered mixed plaque about the right carotid bulb/proximal right ICA without significant stenosis. Right ICA patent distally without stenosis, dissection or occlusion. Left carotid system: Left CCA patent from its origin to the bifurcation without stenosis.  Scattered mixed plaque about the left carotid bulb/proximal left ICA without stenosis. Left ICA patent distally without stenosis, dissection or occlusion. Vertebral arteries: Both vertebral arteries arise from the subclavian arteries. No proximal subclavian artery stenosis. Atheromatous irregularity with multifocal mild-to-moderate stenoses noted throughout the vertebral arteries bilaterally. Vertebral arteries remain patent within the neck without occlusion or other acute vascular abnormality. Skeleton: No visible acute osseous finding. No discrete or worrisome osseous lesions. Other neck: No other acute soft tissue abnormality within the neck. No discrete mass or adenopathy.  Upper chest: Infiltrating tumor involving the left breast and upper right chest wall with overlying skin thickening partially visualized. Visualized upper chest demonstrates no other acute finding. Review of the MIP images confirms the above findings CTA HEAD FINDINGS Anterior circulation: Petrous segments patent bilaterally. Atheromatous change throughout the carotid siphons with associated moderate to severe multifocal stenoses. Left A1 segment patent. Right A1 hypoplastic and/or absent, accounting for the diminutive right ICA is compared to the left. Normal anterior communicating artery complex. Atheromatous change throughout the A2/A3 segments with associated moderate multifocal stenoses, worse on the right. M1 segments mildly irregular but patent without high-grade stenosis. Normal MCA bifurcations. Distal MCA branches well perfused and symmetric. Diffuse [place] vessel atheromatous irregularity. Posterior circulation: Atheromatous change about the proximal V4 segments with associated moderate stenosis on the right and more severe focal stenosis on the left (series 4, image 138). Basilar diminutive and irregular but patent to its distal aspect without stenosis. Superior cerebral arteries grossly patent at their origins. Both PCAs primarily  supplied via the basilar. Atheromatous irregularity throughout the PCAs bilaterally. Short-segment proximal moderate right P2 stenosis (series 5, image 90). PCAs otherwise patent to their distal aspects without high-grade stenosis. Venous sinuses: Grossly patent allowing for timing the contrast bolus. Anatomic variants: Hypoplastic/absent right A1. No visible aneurysm. Review of the MIP images confirms the above findings CT Brain Perfusion Findings: ASPECTS: 10 CBF (<30%) Volume: 42mL Perfusion (Tmax>6.0s) volume: 16mL Mismatch Volume: 19mL Infarction Location:Negative CT perfusion with no evidence for acute core infarct or other perfusion deficit. IMPRESSION: 1. Negative CTA for emergent large vessel occlusion. 2. Negative CT perfusion with no evidence for acute core infarct or other perfusion deficit. 3. Extensive atherosclerotic change throughout the intracranial circulation. Associated moderate to severe stenoses throughout the carotid siphons, with moderate to severe bilateral proximal V4 stenoses. 4. Additional scattered atherosclerotic change elsewhere about the major arterial vasculature of the head and neck as above. No other hemodynamically significant or correctable stenosis. 5. 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 These results were communicated to Dr. Cheral Marker at 8:09 pm on 05/23/2021 by text page via the [facility] messaging system. Electronically Signed   By: Jeannine Boga M.D.   On: 05/23/2021 20:39   DG Chest Portable 1 View  Result Date: 05/23/2021 CLINICAL DATA:  75 year old female with weakness. EXAM: PORTABLE CHEST 1 VIEW COMPARISON:  Chest radiograph dated 09/07/2018. FINDINGS: Left-sided Port-A-Cath with tip at the cavoatrial junction. Mild diffuse chronic interstitial coarsening. No focal consolidations crash that there is a 2.8 x 2.0 cm indeterminate lobulated pleural base consolidation along the lateral right upper lobe not seen on  the prior radiograph or CT. Further evaluation with chest CT is recommended. There is no pleural effusion pneumothorax. The cardiac silhouette is within limits. Atherosclerotic calcification of the aorta. No acute osseous pathology. IMPRESSION: Lobulated pleural base consolidation/nodule along the lateral right upper lobe. Further evaluation with chest CT is recommended. Electronically Signed   By: Anner Crete M.D.   On: 05/23/2021 21:43   ECHOCARDIOGRAM COMPLETE  Result Date: 05/24/2021    ECHOCARDIOGRAM REPORT   Patient Name:   [facility] Date of Exam: 05/24/2021 Medical Rec #:  [id number]     Height:       66.0 in Accession #:    [id number]    Weight:       225.7 lb Date of Birth:  Dec 17, 1945     BSA:  2.105 m Patient Age:    89 years      BP:           160/79 mmHg Patient Gender: F             HR:           100 bpm. Exam Location:  Inpatient Procedure: 2D Echo, Cardiac Doppler, Color Doppler and Intracardiac            Opacification Agent Indications:    Stroke I63.9  History:        Patient has prior history of Echocardiogram examinations, most                 recent 04/03/2017. Carotid Disease and Stroke; Risk                 Factors:Hypertension. Metastatic breast cancer.  Sonographer:    [name] RDCS Referring Phys: [id number] [facility]  Sonographer Comments: Technically difficult study due to poor echo windows, suboptimal parasternal window, suboptimal apical window and suboptimal subcostal window. IMPRESSIONS  1. Left ventricular ejection fraction, by estimation, is 65 to 70%. The left ventricle has normal function. Left ventricular endocardial border not optimally defined to evaluate regional wall motion. Left ventricular diastolic parameters are consistent with Grade I diastolic dysfunction (impaired relaxation).  2. Right ventricular systolic function was not well visualized. The right ventricular size is not well visualized. Tricuspid regurgitation signal is inadequate for  assessing PA pressure.  3. The mitral valve is normal in structure. No evidence of mitral valve regurgitation. No evidence of mitral stenosis. Moderate mitral annular calcification.  4. The aortic valve is tricuspid. Aortic valve regurgitation is not visualized. Mild to moderate aortic valve sclerosis/calcification is present, without any evidence of aortic stenosis.  5. The inferior vena cava is normal in size with greater than 50% respiratory variability, suggesting right atrial pressure of 3 mmHg.  6. Technically difficult study with very poor acoustic windows. FINDINGS  Left Ventricle: Left ventricular ejection fraction, by estimation, is 65 to 70%. The left ventricle has normal function. Left ventricular endocardial border not optimally defined to evaluate regional wall motion. Definity contrast agent was given IV to delineate the left ventricular endocardial borders. The left ventricular internal cavity size was normal in size. There is no left ventricular hypertrophy. Left ventricular diastolic parameters are consistent with Grade I diastolic dysfunction (impaired relaxation). Right Ventricle: The right ventricular size is not well visualized. Right vetricular wall thickness was not well visualized. Right ventricular systolic function was not well visualized. Tricuspid regurgitation signal is inadequate for assessing PA pressure. Left Atrium: Left atrial size was not well visualized. Right Atrium: Right atrial size was not well visualized. Pericardium: There is no evidence of pericardial effusion. Mitral Valve: The mitral valve is normal in structure. Moderate mitral annular calcification. No evidence of mitral valve regurgitation. No evidence of mitral valve stenosis. Tricuspid Valve: The tricuspid valve is not well visualized. Tricuspid valve regurgitation is not demonstrated. Aortic Valve: The aortic valve is tricuspid. Aortic valve regurgitation is not visualized. Mild to moderate aortic valve  sclerosis/calcification is present, without any evidence of aortic stenosis. Pulmonic Valve: The pulmonic valve was not well visualized. Pulmonic valve regurgitation is not visualized. Aorta: The aortic root is normal in size and structure. Venous: The inferior vena cava is normal in size with greater than 50% respiratory variability, suggesting right atrial pressure of 3 mmHg. IAS/Shunts: The interatrial septum was not well visualized.  LEFT VENTRICLE PLAX 2D  LVOT diam:     1.90 cm  Diastology LV SV:         30       LV e' medial:    9.28 cm/s LV SV Index:   14       LV E/e' medial:  8.8 LVOT Area:     2.84 cm LV e' lateral:   6.81 cm/s                         LV E/e' lateral: 12.0  AORTIC VALVE LVOT Vmax:   75.00 cm/s LVOT Vmean:  51.700 cm/s LVOT VTI:    0.105 m  AORTA Ao Root diam: 3.05 cm MITRAL VALVE MV Area (PHT): 3.99 cm    SHUNTS MV Decel Time: 190 msec    Systemic VTI:  0.10 m MV E velocity: 82.00 cm/s  Systemic Diam: 1.90 cm MV A velocity: 93.20 cm/s MV E/A ratio:  0.88 Loralie Champagne MD Electronically signed by Loralie Champagne MD Signature Date/Time: 05/24/2021/4:24:14 PM    Final    CT HEAD CODE STROKE WO CONTRAST  Result Date: 05/23/2021 CLINICAL DATA:  Code stroke. Stroke. New onset of right-sided weakness. EXAM: CT HEAD WITHOUT CONTRAST TECHNIQUE: Contiguous axial images were obtained from the base of the skull through the vertex without intravenous contrast. COMPARISON:  CT head without contrast 04/04/2017 FINDINGS: Brain: A remote inferior right occipital lobe infarct is new since the prior study, but not acute. A left parietal lobe cortical infarct demonstrates volume loss and also appears remote. Remote white matter infarct of the genu of the right internal capsule again noted. Remote infarct of the posterior left frontal lobe appeared more acute on the prior exam. No acute cortical infarcts are present. No hemorrhage or mass lesion is present. Progressive moderate periventricular white matter  disease is present. No acute hemorrhage or mass lesion is present. Remote lacunar infarcts are present in the left cerebellum. The ventricles are proportionate to the degree of atrophy. No significant extraaxial fluid collection is present. Vascular: Atherosclerotic calcifications are present within the cavernous internal carotid arteries. No hyperdense vessel is present. Skull: Insert normal skull No significant extracranial soft tissue lesion is present. Sinuses/Orbits: The paranasal sinuses and mastoid air cells are clear. The globes and orbits are within normal limits. ASPECTS [facility] Stroke Program Early CT Score) - Ganglionic level infarction (caudate, lentiform nuclei, internal capsule, insula, M1-M3 cortex): 7/7 - Supraganglionic infarction (M4-M6 cortex): 3/3 Total score (0-10 with 10 being normal): 10/10 IMPRESSION: 1. No definite acute intracranial abnormality. 2. Left frontal lobe infarct appears remote with volume loss. There was evidence for acute infarct in this area on the prior study. 3. Multiple remote infarcts as described. 4. Progressive moderate atrophy and white matter disease. This likely reflects the sequela of chronic microvascular ischemia. 5. Aspects 10/10 The above was relayed via text pager to Dr. Cheral Marker on 05/23/2021 at 19:43 . Electronically Signed   By: San Morelle M.D.   On: 05/23/2021 19:44     PHYSICAL EXAM  Temp:  [98.1 F (36.7 C)-100.7 F (38.2 C)] 98.2 F (36.8 C) (07/15 1523) Pulse Rate:  [91-116] 96 (07/15 1523) Resp:  [14-24] 18 (07/15 1523) BP: (118-147)/(56-66) 118/66 (07/15 1523) SpO2:  [91 %-98 %] 92 % (07/15 1523) Weight:  [102.9 kg] 102.9 kg (07/14 2200)  General - Well nourished, well developed, in no apparent distress.  Ophthalmologic - fundi not visualized due to noncooperation.  Cardiovascular - Regular rhythm  and rate.  Mental Status -  Level of arousal and orientation to time, place, and person were intact. Language including  expression, naming, repetition, comprehension was assessed and found intact. Fund of Knowledge was assessed and was intact.  Cranial Nerves II - XII - II - Visual field intact OU. III, IV, VI - Extraocular movements intact. V - Facial sensation intact bilaterally. VII - Facial movement intact bilaterally. VIII - Hearing & vestibular intact bilaterally. X - Palate elevates symmetrically. XI - Chin turning & shoulder shrug intact bilaterally. XII - Tongue protrusion intact.  Motor Strength - The patient's strength was normal in left UE and LE extremities and RUE and RLE proximal 4/5 and distal 5/5.  Bulk was normal and fasciculations were absent.   Motor Tone - Muscle tone was assessed at the neck and appendages and was normal.  Reflexes - The patient's reflexes were symmetrical in all extremities and she had no pathological reflexes.  Sensory - Light touch, temperature/pinprick were assessed and were symmetrical.    Coordination - The patient had normal movements in the hands and feet with no ataxia or dysmetria.  Tremor was absent.  Gait and Station - deferred.   ASSESSMENT/PLAN [name] is a 74 y.o. female with history of stroke in 03/2017, status post left CEA, hypertension, breast cancer with metastasis admitted for generalized weakness, increased right lower extremity weakness, and left facial droop. No tPA given due to outside window.    Metabolic encephalopathy COVID infection Generalized weakness COVID PCR positive Fever with tachycardia -> improved On remdesivir  Recrudescence of old stroke 03/2017 left MCA, bilateral ACA patchy acute infarcts.  MRA/CTA head showed severe right tandem and moderate left ICA siphon stenosis, severe bilateral VA stenosis, and severe right M1 and P2 stenosis as well as azygous ACAs. Carotid Doppler and CTA neck showed left ICA high-grade critical stenosis. EF 65-70%. DVT negative. LDL 130, A1C 5.7. Her stroke is considered due to left  ICA stenosis. [facility] Dr. Donzetta Matters consulted and had left CEA on 7/98/92 without complication. She was discharged to CIR with DAPT and lipitor 80. Follows with Dr. Donzetta Matters at [facility] and repeat CUS in 10/2017 showed left ICA 40-59% stenosis.  CT no acute finding, old left frontal infarct CTA head and neck severe bilateral siphon stenosis, bilateral V4 stenosis MRI with and without contrast no acute infarct, no metastasis 2D Echo EF 65 to 70% LDL 73 HgbA1c 6.5 Lovenox for VTE prophylaxis clopidogrel 75 mg daily prior to admission, now on clopidogrel 75 mg daily.  Continue on discharge Patient counseled to be compliant with her antithrombotic medications Ongoing aggressive stroke risk factor management Therapy recommendations: Pending Disposition: Pending  Hypertension Stable Long term BP goal normotensive  Hyperlipidemia Home meds: Lipitor 80 LDL 73, goal < 70 Now on Lipitor 80 Continue statin at discharge  Other Stroke Risk Factors Advanced age Obesity, Body mass index is 33.5 kg/m.  Hx stroke/TIA Breast cancer with metastasis - on chemotherapy, continue to follow-up with oncology  Other Active Problems Fever/tachycardia - likely due to COVID infection AKI, creatinine 1.12-1.10  Hospital day # 0  Neurology will sign off. Please call with questions.  No neurology follow-up needed at this time. Thanks for the consult.   Rosalin Hawking, MD PhD Stroke Neurology 05/25/2021 7:04 PM    To contact Stroke Continuity provider, please refer to [email or web address] After hours, contact General Neurology

## 2021-05-26 ENCOUNTER — Inpatient Hospital Stay (HOSPITAL_COMMUNITY): Payer: Medicare Other

## 2021-05-26 DIAGNOSIS — R7989 Other specified abnormal findings of blood chemistry: Secondary | ICD-10-CM | POA: Diagnosis not present

## 2021-05-26 DIAGNOSIS — U071 COVID-19: Secondary | ICD-10-CM | POA: Diagnosis not present

## 2021-05-26 DIAGNOSIS — I10 Essential (primary) hypertension: Secondary | ICD-10-CM

## 2021-05-26 LAB — BASIC METABOLIC PANEL
Anion gap: 9 (ref 5–15)
BUN: 17 mg/dL (ref 8–23)
CO2: 25 mmol/L (ref 22–32)
Calcium: 8.2 mg/dL — ABNORMAL LOW (ref 8.9–10.3)
Chloride: 102 mmol/L (ref 98–111)
Creatinine, Ser: 0.99 mg/dL (ref 0.44–1.00)
GFR, Estimated: >60 mL/min (ref >60)
Glucose, Bld: 121 mg/dL — ABNORMAL HIGH (ref 70–99)
Potassium: 3.9 mmol/L (ref 3.5–5.1)
Sodium: 136 mmol/L (ref 135–145)

## 2021-05-26 LAB — D-DIMER, QUANTITATIVE: D-Dimer, Quant: 1.22 ug/mL-FEU — ABNORMAL HIGH (ref 0.00–0.50)

## 2021-05-26 LAB — CBC
HCT: 34.5 % — ABNORMAL LOW (ref 36.0–46.0)
Hemoglobin: 10.8 g/dL — ABNORMAL LOW (ref 12.0–15.0)
MCH: 24.4 pg — ABNORMAL LOW (ref 26.0–34.0)
MCHC: 31.3 g/dL (ref 30.0–36.0)
MCV: 77.9 fL — ABNORMAL LOW (ref 80.0–100.0)
Platelets: 156 10*3/uL (ref 150–400)
RBC: 4.43 MIL/uL (ref 3.87–5.11)
RDW: 19.4 % — ABNORMAL HIGH (ref 11.5–15.5)
WBC: 4 10*3/uL (ref 4.0–10.5)
nRBC: 0 % (ref 0.0–0.2)

## 2021-05-26 LAB — BRAIN NATRIURETIC PEPTIDE: B Natriuretic Peptide: 4.4 pg/mL (ref 0.0–100.0)

## 2021-05-26 LAB — C-REACTIVE PROTEIN: CRP: 2.1 mg/dL — ABNORMAL HIGH (ref <1.0)

## 2021-05-26 LAB — MAGNESIUM: Magnesium: 2.2 mg/dL (ref 1.7–2.4)

## 2021-05-26 NOTE — TOC Progression Note (Signed)
Transition of Care [facility]) - Progression Note    Patient Details  Name: Nilza Eaker MRN: [id number] Date of Birth: [birth year]  Transition of Care [facility]) CM/SW [place], LCSW Phone Number: 05/26/2021, 1:00 PM  Clinical Narrative:    CSW received call from patient's daughter asking about CIR. CSW explained that CIR has not been recommended but is also unable to accept COVID + patients. CSW sent referral to [facility] with [facility] [phone]) for review since they do accept COVID + patients.    Expected Discharge Plan: Skilled Nursing Facility Barriers to Discharge: SNF Covid  Expected Discharge Plan and Services Expected Discharge Plan: [place] In-house Referral: Clinical Social Work     Living arrangements for the past 2 months: Single Family Home                                       Social Determinants of Health (SDOH) Interventions    Readmission Risk Interventions No flowsheet data found.

## 2021-05-26 NOTE — Progress Notes (Signed)
Lower extremity venous bilateral study completed.   Please see CV Proc for preliminary results.   Evonte Prestage, RDMS, RVT  

## 2021-05-26 NOTE — Progress Notes (Signed)
PROGRESS NOTE  [name] [name] HER:[id number] DOB: 02-08-1946 DOA: 05/23/2021 PCP: [name], DO  HPI/Recap of past 76 hours:  75 year old female with past medical history of CVA with residual right hemiparesis, metastatic breast cancer on palliative chemotherapy, carotid stenosis status post left CEA and hypertension who presented to the emergency department on 7/13 with worsening right-sided weakness.  In the hospital she was diagnosed with COVID-19 pneumonia and worsening of her previous hemiparesis due to COVID-19 stress, neurology saw the patient and she was admitted for further treatment.   Assessment/Plan:  Worsening of residual right-sided weakness from previous stroke caused by worsening of previous hemiparesis due to stress of COVID-19: Seen by neurology, stroke work-up done, on Plavix and statin, A1c stable LDL near goal.  Will qualify for SNF and agreeable for it.  Appreciate stroke input.  Sepsis ([place]) secondary to COVID-19 viral infection: Patient met criteria for sepsis given tachypnea and tachycardia from COVID-19 infection.  Fortunately not hypoxic.  Inflammatory markers are stable and she will get 3 days of IV remdesivir.  Sepsis pathophysiology has completely resolved.    Essential hypertension: Continue home medications.  Malignant neoplasm of overlapping sites of right breast in female, estrogen receptor positive [facility]): Appreciate wound care help.  Receiving palliative chemotherapy.  Follows with Dr. Jana Hakim.  Post discharge continue follow-up with him.  CKD (chronic kidney disease) stage 3, GFR 30-59 ml/min ([place]): Looks to be at baseline  Obesity (BMI 30-39.9): Meets criteria BMI greater than 30, follow with PCP for weight loss.     Code Status: Full code  Family Communication: Daughter at the bedside by previous MD  Disposition Plan: PT and OT recommending skilled nursing.   Consultants: Neurology  Procedures: None  Antimicrobials: IV Remdisivir  7/14 x 3 doses  DVT prophylaxis: Lovenox  Level of care: Med-Surg   Objective: Vitals:   05/26/21 0346 05/26/21 0814  BP: (!) 144/63 (!) 157/68  Pulse: 96 91  Resp: 20 17  Temp: 98.8 F (37.1 C) 98.2 F (36.8 C)  SpO2: 93% 91%    Intake/Output Summary (Last 24 hours) at 05/26/2021 1020 Last data filed at 05/26/2021 0814 Gross per 24 hour  Intake --  Output 1100 ml  Net -1100 ml   Filed Weights   05/23/21 1923 05/24/21 2200 05/26/21 0346  Weight: 102.4 kg 102.9 kg 102.4 kg   Body mass index is 33.34 kg/m.  Exam:  Awake Alert, No new F.N deficits, R side weaker than the left [place].AT,PERRAL Supple Neck,No JVD, No cervical lymphadenopathy appriciated.  Symmetrical Chest wall movement, Good air movement bilaterally, CTAB RRR,No Gallops, Rubs or new Murmurs, No Parasternal Heave +ve B.Sounds, Abd Soft, No tenderness, No organomegaly appriciated, No rebound - guarding or rigidity. No Cyanosis, Clubbing or edema, No new Rash or bruise    Data Reviewed:  Recent Labs  Lab 05/23/21 1925 05/23/21 1936 05/26/21 0807  WBC 8.7  --  4.0  HGB 10.6* 10.5* 10.8*  HCT 35.1* 31.0* 34.5*  PLT 185  --  156  MCV 81.1  --  77.9*  MCH 24.5*  --  24.4*  MCHC 30.2  --  31.3  RDW 18.8*  --  19.4*  LYMPHSABS 1.3  --   --   MONOABS 1.2*  --   --   EOSABS 0.1  --   --   BASOSABS 0.1  --   --     Recent Labs  Lab 05/23/21 1925 05/23/21 1936 05/24/21 0321 05/26/21 0807  NA 139  139  --  136  K 3.7 3.7  --  3.9  CL 106 106  --  102  CO2 23  --   --  25  GLUCOSE 138* 134*  --  121*  BUN 16 15  --  17  CREATININE 1.12* 1.10*  --  0.99  CALCIUM 8.9  --   --  8.2*  AST 20  --   --   --   ALT 18  --   --   --   ALKPHOS 115  --   --   --   BILITOT 0.7  --   --   --   ALBUMIN 3.6  --   --   --   MG  --   --   --  2.2  CRP  --   --   --  2.1*  DDIMER  --   --   --  1.22*  INR 1.1  --   --   --   HGBA1C  --   --  6.5*  --   BNP  --   --   --  4.4    Lab Results  Component  Value Date   SARSCOV2NAA POSITIVE (A) 05/23/2021    Lab Results  Component Value Date   HGBA1C 6.5 (H) 05/24/2021   Lab Results  Component Value Date   CHOL 127 05/24/2021   HDL 48 05/24/2021   LDLCALC 73 05/24/2021   TRIG 31 05/24/2021   CHOLHDL 2.6 05/24/2021     Studies: DG Chest Port 1 View  Result Date: 05/26/2021 CLINICAL DATA:  75 year old female with history of shortness of breath. COVID positive patient. EXAM: PORTABLE CHEST 1 VIEW COMPARISON:  Chest x-ray 05/23/2021. FINDINGS: Lung volumes are normal. Irregular opacities in the left lung base which may reflect areas of atelectasis and/or consolidation. Right lung appears clear. Specifically, the peripheral lobulated opacity in the right upper lobe seen on the prior study is no longer clearly identifiable. No definite pleural effusions. No pneumothorax. No evidence of pulmonary edema. Heart size is borderline enlarged. Upper mediastinal contours are within normal limits. Left-sided internal jugular single-lumen porta cath with tip terminating at the superior cavoatrial junction. IMPRESSION: 1. Atelectasis and/or consolidation in the left lower lobe. 2. Previously noted nodular density projecting over the lateral aspect of the right upper lobe is no longer readily apparent on today's examination. 3. Aortic atherosclerosis. Electronically Signed   By: Vinnie Langton M.D.   On: 05/26/2021 09:16    Scheduled Meds:   stroke: mapping our early stages of recovery book   Does not apply Once   amLODipine  10 mg Oral Daily   atorvastatin  80 mg Oral QPM   capecitabine  1,000 mg Oral Daily   Chlorhexidine Gluconate Cloth  6 each Topical Daily   clopidogrel  75 mg Oral Daily   enoxaparin (LOVENOX) injection  50 mg Subcutaneous Q24H   sodium chloride flush  3 mL Intravenous Once    Continuous Infusions:  remdesivir 100 mg in NS 100 mL 100 mg (05/26/21 0951)     LOS: 1 day   Signature  Lala Lund M.D on 05/26/2021 at 10:20  AM   -  To page go to [email or web address]

## 2021-05-27 LAB — MAGNESIUM: Magnesium: 2.2 mg/dL (ref 1.7–2.4)

## 2021-05-27 LAB — C-REACTIVE PROTEIN: CRP: 1.4 mg/dL — ABNORMAL HIGH (ref <1.0)

## 2021-05-27 LAB — COMPREHENSIVE METABOLIC PANEL
ALT: 19 U/L (ref 0–44)
AST: 29 U/L (ref 15–41)
Albumin: 2.9 g/dL — ABNORMAL LOW (ref 3.5–5.0)
Alkaline Phosphatase: 85 U/L (ref 38–126)
Anion gap: 7 (ref 5–15)
BUN: 19 mg/dL (ref 8–23)
CO2: 26 mmol/L (ref 22–32)
Calcium: 8.2 mg/dL — ABNORMAL LOW (ref 8.9–10.3)
Chloride: 102 mmol/L (ref 98–111)
Creatinine, Ser: 1.04 mg/dL — ABNORMAL HIGH (ref 0.44–1.00)
GFR, Estimated: 57 mL/min — ABNORMAL LOW (ref >60)
Glucose, Bld: 116 mg/dL — ABNORMAL HIGH (ref 70–99)
Potassium: 4.2 mmol/L (ref 3.5–5.1)
Sodium: 135 mmol/L (ref 135–145)
Total Bilirubin: 0.5 mg/dL (ref 0.3–1.2)
Total Protein: 6.2 g/dL — ABNORMAL LOW (ref 6.5–8.1)

## 2021-05-27 LAB — CBC WITH DIFFERENTIAL/PLATELET
Abs Immature Granulocytes: 0.04 10*3/uL (ref 0.00–0.07)
Basophils Absolute: 0 10*3/uL (ref 0.0–0.1)
Basophils Relative: 0 %
Eosinophils Absolute: 0.1 10*3/uL (ref 0.0–0.5)
Eosinophils Relative: 3 %
HCT: 37.5 % (ref 36.0–46.0)
Hemoglobin: 11.4 g/dL — ABNORMAL LOW (ref 12.0–15.0)
Immature Granulocytes: 1 %
Lymphocytes Relative: 33 %
Lymphs Abs: 1.5 10*3/uL (ref 0.7–4.0)
MCH: 24.1 pg — ABNORMAL LOW (ref 26.0–34.0)
MCHC: 30.4 g/dL (ref 30.0–36.0)
MCV: 79.1 fL — ABNORMAL LOW (ref 80.0–100.0)
Monocytes Absolute: 0.6 10*3/uL (ref 0.1–1.0)
Monocytes Relative: 12 %
Neutro Abs: 2.4 10*3/uL (ref 1.7–7.7)
Neutrophils Relative %: 51 %
Platelets: UNDETERMINED 10*3/uL (ref 150–400)
RBC: 4.74 MIL/uL (ref 3.87–5.11)
RDW: 19.9 % — ABNORMAL HIGH (ref 11.5–15.5)
WBC: 4.6 10*3/uL (ref 4.0–10.5)
nRBC: 0 % (ref 0.0–0.2)

## 2021-05-27 LAB — BRAIN NATRIURETIC PEPTIDE: B Natriuretic Peptide: 45.4 pg/mL (ref 0.0–100.0)

## 2021-05-27 LAB — D-DIMER, QUANTITATIVE: D-Dimer, Quant: 0.98 ug/mL-FEU — ABNORMAL HIGH (ref 0.00–0.50)

## 2021-05-27 MED ORDER — CARVEDILOL 3.125 MG PO TABS
3.1250 mg | ORAL_TABLET | Freq: Two times a day (BID) | ORAL | Status: DC
  Administered 2021-05-27 – 2021-05-29 (×5): 3.125 mg via ORAL
  Filled 2021-05-27 (×5): qty 1

## 2021-05-27 NOTE — Progress Notes (Signed)
Physical Therapy Treatment Patient Details Name: [name] [name] MRN: [id number] DOB: 07/27/1946 Today's Date: 05/27/2021    History of Present Illness [name] [name] is a 75 y.o. female who presents with concerns of not being able to walk on 05/23/21.  COVID was +.  Pt admitted with sepsis secondary to [place].  CT Head negative for acute infarct.  MRI is pending.  PHMX: CVA with residual right hemiparesis, metastatic breast cancer on palliative chemotherapy, carotid stenosis s/p left CEA, hypertension.    PT Comments    Patient received in bed, very pleasant and motivated to participate today. Still requires heavy +2 physical assist for all aspects of mobility today especially for gaining balance with positional transitions- but able to transfer over into the recliner well with 2 person HHA. Deferred gait today due to being more fatigued this morning than in previous session- tells me that transfer took most of her energy. Left up in recliner with all needs met, RN aware of patient status. Continue to recommend SNF at DC.    Follow Up Recommendations  SNF     Equipment Recommendations  Rolling walker with 5" wheels;Wheelchair cushion (measurements PT);3in1 (PT);Wheelchair (measurements PT)    Recommendations for Other Services       Precautions / Restrictions Precautions Precautions: Fall Precaution Comments: residual R weakness Restrictions Weight Bearing Restrictions: No    Mobility  Bed Mobility Overal bed mobility: Needs Assistance Bed Mobility: Supine to Sit     Supine to sit: +2 for physical assistance;Max assist;HOB elevated     General bed mobility comments: maxA for BLE and for trunk progression, cues for forward flexion at th hips and shifting weight right to counter initial left lean    Transfers Overall transfer level: Needs assistance Equipment used: Rolling walker (2 wheeled) Transfers: Sit to/from Stand Sit to Stand: Max assist;+2 physical assistance Stand  pivot transfers: +2 physical assistance;Min assist       General transfer comment: needed as much as MaxAx2 to power all the way up in to standing and gain balance, however once up able to transfer to recliner with MinAx2 but extremely fatigued afterwards  Ambulation/Gait             General Gait Details: deferred- more fatigued today   Stairs             Wheelchair Mobility    Modified Rankin (Stroke Patients Only)       Balance Overall balance assessment: Needs assistance Sitting-balance support: Bilateral upper extremity supported;Feet supported Sitting balance-Leahy Scale: Poor Sitting balance - Comments: requiring UE support Postural control: Posterior lean;Left lateral lean Standing balance support: Bilateral upper extremity supported Standing balance-Leahy Scale: Poor Standing balance comment: requiring bil UE support and support from therapist                            Cognition Arousal/Alertness: Awake/alert Behavior During Therapy: Flat affect Overall Cognitive Status: Impaired/Different from baseline Area of Impairment: Memory;Following commands;Safety/judgement;Problem solving                     Memory: Decreased short-term memory Following Commands: Follows one step commands with increased time Safety/Judgement: Decreased awareness of safety;Decreased awareness of deficits   Problem Solving: Slow processing;Difficulty sequencing General Comments: still a bit slow to answer, oriented but does still have limitations with problem solving and cues for initation. Reports her R weakness seems back to baseline.      Exercises  General Comments        Pertinent Vitals/Pain Pain Assessment: No/denies pain    Home Living                      Prior Function            PT Goals (current goals can now be found in the care plan section) Acute Rehab PT Goals Patient Stated Goal: to get stronger again so I can  walk by myself again PT Goal Formulation: With patient/family Time For Goal Achievement: 06/07/21 Potential to Achieve Goals: Good Progress towards PT goals: Progressing toward goals    Frequency    Min 3X/week      PT Plan Current plan remains appropriate    Co-evaluation              AM-PAC PT "6 Clicks" Mobility   Outcome Measure  Help needed turning from your back to your side while in a flat bed without using bedrails?: A Lot Help needed moving from lying on your back to sitting on the side of a flat bed without using bedrails?: Total Help needed moving to and from a bed to a chair (including a wheelchair)?: Total Help needed standing up from a chair using your arms (e.g., wheelchair or bedside chair)?: Total Help needed to walk in hospital room?: Total Help needed climbing 3-5 steps with a railing? : Total 6 Click Score: 7    End of Session   Activity Tolerance: Patient tolerated treatment well Patient left: in chair;with call bell/phone within reach Nurse Communication: Mobility status;Need for lift equipment PT Visit Diagnosis: Unsteadiness on feet (R26.81);Other abnormalities of gait and mobility (R26.89);Muscle weakness (generalized) (M62.81)     Time: 0950-1009 PT Time Calculation (min) (ACUTE ONLY): 19 min  Charges:  $Therapeutic Activity: 8-22 mins                    Windell Norfolk, DPT, PN1   Supplemental Physical Therapist [place]    Pager [phone] Acute Rehab Office [phone]

## 2021-05-27 NOTE — Progress Notes (Addendum)
PROGRESS NOTE  [name] [name] GUR:[id number] DOB: 11/06/1946 DOA: 05/23/2021 PCP: [name], DO  HPI/Recap of past 45 hours:  75 year old female with past medical history of CVA with residual right hemiparesis, metastatic breast cancer on palliative chemotherapy, carotid stenosis status post left CEA and hypertension who presented to the emergency department on 7/13 with worsening right-sided weakness.  In the hospital she was diagnosed with COVID-19 pneumonia and worsening of her previous hemiparesis due to COVID-19 stress, neurology saw the patient and she was admitted for further treatment.   Subjective - Patient in bed, appears comfortable, denies any headache, no fever, no chest pain or pressure, no shortness of breath , no abdominal pain. No new focal weakness.   Assessment/Plan:  Worsening of residual right-sided weakness from previous stroke caused by worsening of previous hemiparesis due to stress of COVID-19: Seen by neurology, stroke work-up done, on Plavix and statin, A1c stable LDL near goal.  Will qualify for SNF and agreeable for it.  Appreciate stroke input.  Sepsis ([place]) secondary to COVID-19 viral infection: Patient met criteria for sepsis given tachypnea and tachycardia from COVID-19 infection.  Fortunately not hypoxic.  Inflammatory markers are stable and she will get 3 days of IV remdesivir.  Sepsis pathophysiology has completely resolved.    Essential hypertension: On Norvasc, have added Coreg for better control.  Malignant neoplasm of overlapping sites of right breast in female, estrogen receptor positive [facility]): Appreciate wound care help.  Receiving palliative chemotherapy.  Follows with Dr. Jana Hakim.  Post discharge continue follow-up with him.  CKD (chronic kidney disease) stage 3, GFR 30-59 ml/min ([place]): Looks to be at baseline  Obesity (BMI 30-39.9): Meets criteria BMI greater than 30, follow with PCP for weight loss.  Mildly elevated D-dimer due to  COVID-19 inflammation.  Stable on Lovenox.  Lower extremity venous duplex negative.     Code Status: Full code  Family Communication: Daughter on 05/27/21  Disposition Plan: PT and OT recommending skilled nursing.   Consultants: Neurology  Procedures: None  Antimicrobials: IV Remdisivir 7/14 x 3 doses  DVT prophylaxis: Lovenox  Level of care: Med-Surg   Objective: Vitals:   05/27/21 0506 05/27/21 0800  BP: 131/60 (!) 133/95  Pulse: 94   Resp: 17   Temp: 98.5 F (36.9 C) 98.4 F (36.9 C)  SpO2: 93%     Intake/Output Summary (Last 24 hours) at 05/27/2021 1057 Last data filed at 05/27/2021 0507 Gross per 24 hour  Intake 300 ml  Output 1300 ml  Net -1000 ml   Filed Weights   05/23/21 1923 05/24/21 2200 05/26/21 0346  Weight: 102.4 kg 102.9 kg 102.4 kg   Body mass index is 33.34 kg/m.  Exam:  Awake Alert, No new F.N deficits, R side weaker than the left [place].AT,PERRAL Supple Neck,No JVD, No cervical lymphadenopathy appriciated.  Symmetrical Chest wall movement, Good air movement bilaterally, CTAB RRR,No Gallops, Rubs or new Murmurs, No Parasternal Heave +ve B.Sounds, Abd Soft, No tenderness, No organomegaly appriciated, No rebound - guarding or rigidity. No Cyanosis, Clubbing or edema, No new Rash or bruise    Data Reviewed:  Recent Labs  Lab 05/23/21 1925 05/23/21 1936 05/26/21 0807 05/27/21 0228  WBC 8.7  --  4.0 4.6  HGB 10.6* 10.5* 10.8* 11.4*  HCT 35.1* 31.0* 34.5* 37.5  PLT 185  --  156 PLATELET CLUMPS NOTED ON SMEAR, UNABLE TO ESTIMATE  MCV 81.1  --  77.9* 79.1*  MCH 24.5*  --  24.4* 24.1*  MCHC 30.2  --  31.3 30.4  RDW 18.8*  --  19.4* 19.9*  LYMPHSABS 1.3  --   --  1.5  MONOABS 1.2*  --   --  0.6  EOSABS 0.1  --   --  0.1  BASOSABS 0.1  --   --  0.0    Recent Labs  Lab 05/23/21 1925 05/23/21 1936 05/24/21 0321 05/26/21 0807 05/27/21 0228 05/27/21 0230  NA 139 139  --  136 135  --   K 3.7 3.7  --  3.9 4.2  --   CL 106 106   --  102 102  --   CO2 23  --   --  25 26  --   GLUCOSE 138* 134*  --  121* 116*  --   BUN 16 15  --  17 19  --   CREATININE 1.12* 1.10*  --  0.99 1.04*  --   CALCIUM 8.9  --   --  8.2* 8.2*  --   AST 20  --   --   --  29  --   ALT 18  --   --   --  19  --   ALKPHOS 115  --   --   --  85  --   BILITOT 0.7  --   --   --  0.5  --   ALBUMIN 3.6  --   --   --  2.9*  --   MG  --   --   --  2.2 2.2  --   CRP  --   --   --  2.1* 1.4*  --   DDIMER  --   --   --  1.22* 0.98*  --   INR 1.1  --   --   --   --   --   HGBA1C  --   --  6.5*  --   --   --   BNP  --   --   --  4.4  --  45.4    Lab Results  Component Value Date   SARSCOV2NAA POSITIVE (A) 05/23/2021    Lab Results  Component Value Date   HGBA1C 6.5 (H) 05/24/2021   Lab Results  Component Value Date   CHOL 127 05/24/2021   HDL 48 05/24/2021   LDLCALC 73 05/24/2021   TRIG 31 05/24/2021   CHOLHDL 2.6 05/24/2021     Studies: VAS [place] LOWER EXTREMITY VENOUS (DVT)  Result Date: 05/26/2021  Lower Venous DVT Study Patient Name:  [name] [name]  Date of Exam:   05/26/2021 Medical Rec #: [id number]      Accession #:    [id number] Date of Birth: [birth year]      Patient Gender: F Patient Age:   49Y Exam Location:  [facility] Procedure:      VAS [place] LOWER EXTREMITY VENOUS (DVT) Referring Phys: [facility] --------------------------------------------------------------------------------  Indications: D-dimer, Covid-19.  Comparison Study: 04-03-2017 Prior bilateral lower extremity venous was negative                   for DVT. Performing Technologist: Darlin Coco RDMS,RVT  Examination Guidelines: A complete evaluation includes B-mode imaging, spectral Doppler, color Doppler, and power Doppler as needed of all accessible portions of each vessel. Bilateral testing is considered an integral part of a complete examination. Limited examinations for reoccurring indications may be performed as noted. The reflux portion of the exam is  performed with the patient  in reverse Trendelenburg.  +---------+---------------+---------+-----------+----------+--------------+ RIGHT    CompressibilityPhasicitySpontaneityPropertiesThrombus Aging +---------+---------------+---------+-----------+----------+--------------+ CFV      Full           Yes      Yes                                 +---------+---------------+---------+-----------+----------+--------------+ SFJ      Full                                                        +---------+---------------+---------+-----------+----------+--------------+ FV Prox  Full                                                        +---------+---------------+---------+-----------+----------+--------------+ FV Mid   Full                                                        +---------+---------------+---------+-----------+----------+--------------+ FV DistalFull                                                        +---------+---------------+---------+-----------+----------+--------------+ PFV      Full                                                        +---------+---------------+---------+-----------+----------+--------------+ POP      Full           Yes      Yes                                 +---------+---------------+---------+-----------+----------+--------------+ PTV      Full                                                        +---------+---------------+---------+-----------+----------+--------------+ PERO     Full                                                        +---------+---------------+---------+-----------+----------+--------------+   +---------+---------------+---------+-----------+----------+--------------+ LEFT     CompressibilityPhasicitySpontaneityPropertiesThrombus Aging +---------+---------------+---------+-----------+----------+--------------+ CFV      Full           Yes      Yes                                  +---------+---------------+---------+-----------+----------+--------------+  SFJ      Full                                                        +---------+---------------+---------+-----------+----------+--------------+ FV Prox  Full                                                        +---------+---------------+---------+-----------+----------+--------------+ FV Mid   Full                                                        +---------+---------------+---------+-----------+----------+--------------+ FV DistalFull                                                        +---------+---------------+---------+-----------+----------+--------------+ PFV      Full                                                        +---------+---------------+---------+-----------+----------+--------------+ POP      Full           Yes      Yes                                 +---------+---------------+---------+-----------+----------+--------------+ PTV      Full                                                        +---------+---------------+---------+-----------+----------+--------------+ PERO     Full                                                        +---------+---------------+---------+-----------+----------+--------------+     Summary: RIGHT: - There is no evidence of deep vein thrombosis in the lower extremity.  - No cystic structure found in the popliteal fossa.  LEFT: - There is no evidence of deep vein thrombosis in the lower extremity.  - No cystic structure found in the popliteal fossa.  *See table(s) above for measurements and observations. Electronically signed by Monica Martinez MD on 05/26/2021 at 4:31:50 PM.    Final     Scheduled Meds:   stroke: mapping our early stages of recovery book   Does not apply Once   amLODipine  10 mg Oral Daily   atorvastatin  80 mg  Oral QPM   capecitabine  1,000 mg Oral Daily   Chlorhexidine Gluconate Cloth  6  each Topical Daily   clopidogrel  75 mg Oral Daily   enoxaparin (LOVENOX) injection  50 mg Subcutaneous Q24H   sodium chloride flush  3 mL Intravenous Once    Continuous Infusions:     LOS: 2 days   Signature  Lala Lund M.D on 05/27/2021 at 10:57 AM   -  To page go to [email or web address]

## 2021-05-27 NOTE — Evaluation (Signed)
Speech Language Pathology Evaluation Patient Details Name: [name] [name] MRN: [id number] DOB: 1946/02/15 Today's Date: 05/27/2021 Time: 3151-7616 SLP Time Calculation (min) (ACUTE ONLY): 22 min  Problem List:  Patient Active Problem List   Diagnosis Date Noted   Obesity (BMI 30-39.9) 05/25/2021   Weakness 05/24/2021   History of CVA (cerebrovascular accident) 05/24/2021   CKD (chronic kidney disease) stage 3, GFR 30-59 ml/min ([place]) 05/24/2021   Sepsis ([place]) 05/24/2021   COVID-19 virus infection 05/24/2021   Goals of care, counseling/discussion 05/12/2019   Pain due to onychomycosis of toenails of both feet 05/07/2019   [place] durum 05/07/2019   Coagulation disorder ([place]) 05/07/2019   Multiple lung nodules 01/11/2019   Port-A-Cath in place 10/09/2018   Inflammatory breast cancer, right ([place]) 08/26/2018   Malignant neoplasm of overlapping sites of right breast in female, estrogen receptor positive ([place]) 08/24/2018   Glucose intolerance (impaired glucose tolerance) 05/01/2017   Depression 05/01/2017   Hypertensive crisis    Acute bilat watershed infarction [facility]) 04/09/2017   Intracranial vascular stenosis    Hemiparesis affecting dominant side as late effect of stroke (HCC)    Post-operative pain    S/P carotid endarterectomy    Essential hypertension    Stenosis of left carotid artery    Hyperlipidemia    Gait disturbance, post-stroke 04/02/2017   Right arm weakness    Slowness and poor responsiveness    Hypertensive emergency 04/01/2017   Hypokalemia 04/01/2017   Hyperglycemia 04/01/2017   Stroke-like symptoms 04/01/2017   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 ([place]) 04/01/2017   Past Medical History:  Past Medical History:  Diagnosis Date   Anxiety    Cancer ([place])    breast cancer- right   Carotid stenosis    Depression    situational   Dyspnea    with much ambulation   GERD (gastroesophageal reflux disease)    Hypertension     Stroke ([place]) 04/01/2017   a little weakness right side leg and hand, a little memry loss   Past Surgical History:  Past Surgical History:  Procedure Laterality Date   ABDOMINAL HYSTERECTOMY     ENDARTERECTOMY Left 04/08/2017   Procedure: ENDARTERECTOMY CAROTID;  Surgeon: Waynetta Sandy, MD;  Location: [place];  Service: Vascular;  Laterality: Left;   PATCH ANGIOPLASTY Left 04/08/2017   Procedure: PATCH ANGIOPLASTY Left Carotid;  Surgeon: Waynetta Sandy, MD;  Location: [facility];  Service: Vascular;  Laterality: Left;   PORTACATH PLACEMENT Left 09/07/2018   Procedure: INSERTION PORT-A-CATH LEFT INTERNAL JUGULAR;  Surgeon: [name], MD;  Location: [facility] OR;  Service: General;  Laterality: Left;   HPI:  Pt is a 75 y.o. female who presented with concerns of not being able to walk. MRI brain 7/14 was negative for acute changes, but revealed multiple remote infratentorial and supratentorial infarcts with moderate to severe chronic microvascular ischemic disease. Pt found to be COVID+. PMH: CVA with residual right hemiparesis, metastatic breast cancer on palliative chemotherapy, carotid stenosis s/p left CEA, hypertension.   Assessment / Plan / Recommendation Clinical Impression  Pt participated in speech/language evaluation. Pt initially denied any baseline deficits in speech, language, or cognition, but subsequently stated that she has had difficulty with memory since a prior CVA. Pt stated that she resides with her daughter, and that her daughter currently manages her finances and assists with medication management. The [facility] Mental Status Examination was completed to evaluate the pt's cognitive-linguistic skills. Pt's score was adjusted  since she was unable to complete the clock drawing task due to RUE weakness. She achieved an adjusted score of 15/26. She exhibited difficulty in the areas of memory, attention, awareness, complex problem solving, and executive  function. Pt indicated that she believes her performance today was representative of her baseline and review of outpatient SLP notes from 2018, revealed that her current performance appears similar to that noted then. Her speech and language skills are currently [facility]. Further acute skilled SLP services are not clinically indicated at this time. Pt and nursing were educated regarding results and recommendations; both parties verbalized understanding as well as agreement with plan of care.    SLP Assessment  SLP Recommendation/Assessment: Patient does not need any further Speech Lanaguage Pathology Services SLP Visit Diagnosis: Cognitive communication deficit (R41.841)    Follow Up Recommendations  None    Frequency and Duration           SLP Evaluation Cognition  Overall Cognitive Status: History of cognitive impairments - at baseline Arousal/Alertness: Awake/alert Orientation Level: Oriented X4 Attention: Focused;Sustained Focused Attention: Appears intact Sustained Attention: Impaired Sustained Attention Impairment: Verbal complex Memory: Impaired Memory Impairment: Retrieval deficit;Decreased recall of new information (Immediate: 5/5; delayed: 2/5; with cues: 2/3; paragraph 6/8) Awareness: Impaired Awareness Impairment: Emergent impairment Executive Function: Sequencing;Organizing Organizing: Impaired Organizing Impairment: Verbal complex (backward digit span: 1/2)       Comprehension  Auditory Comprehension Overall Auditory Comprehension: Appears within functional limits for tasks assessed Yes/No Questions: Within Functional Limits Commands: Within Functional Limits Conversation: Complex    Expression Verbal Expression Overall Verbal Expression: Appears within functional limits for tasks assessed Initiation: No impairment Level of Generative/Spontaneous Verbalization: Conversation Repetition: No impairment Naming: No impairment   Oral / Motor  Oral Motor/Sensory  Function Overall Oral Motor/Sensory Function: Within functional limits Motor Speech Overall Motor Speech: Appears within functional limits for tasks assessed Respiration: Within functional limits Phonation: Normal Resonance: Within functional limits Articulation: Within functional limitis Intelligibility: Intelligible Motor Planning: Witnin functional limits Motor Speech Errors: Not applicable   Bartt Gonzaga I. Hardin Negus, [place], [place] Office number [phone] Pager [place] 05/27/2021, 5:28 PM

## 2021-05-28 ENCOUNTER — Other Ambulatory Visit: Payer: Self-pay | Admitting: Oncology

## 2021-05-28 ENCOUNTER — Other Ambulatory Visit (HOSPITAL_COMMUNITY): Payer: Self-pay

## 2021-05-28 DIAGNOSIS — C50911 Malignant neoplasm of unspecified site of right female breast: Secondary | ICD-10-CM

## 2021-05-28 LAB — CBC WITH DIFFERENTIAL/PLATELET
Abs Immature Granulocytes: 0.02 10*3/uL (ref 0.00–0.07)
Basophils Absolute: 0 10*3/uL (ref 0.0–0.1)
Basophils Relative: 1 %
Eosinophils Absolute: 0.2 10*3/uL (ref 0.0–0.5)
Eosinophils Relative: 5 %
HCT: 33.1 % — ABNORMAL LOW (ref 36.0–46.0)
Hemoglobin: 10.3 g/dL — ABNORMAL LOW (ref 12.0–15.0)
Immature Granulocytes: 1 %
Lymphocytes Relative: 27 %
Lymphs Abs: 1 10*3/uL (ref 0.7–4.0)
MCH: 24.2 pg — ABNORMAL LOW (ref 26.0–34.0)
MCHC: 31.1 g/dL (ref 30.0–36.0)
MCV: 77.7 fL — ABNORMAL LOW (ref 80.0–100.0)
Monocytes Absolute: 0.5 10*3/uL (ref 0.1–1.0)
Monocytes Relative: 14 %
Neutro Abs: 2 10*3/uL (ref 1.7–7.7)
Neutrophils Relative %: 52 %
Platelets: 172 10*3/uL (ref 150–400)
RBC: 4.26 MIL/uL (ref 3.87–5.11)
RDW: 19.2 % — ABNORMAL HIGH (ref 11.5–15.5)
WBC: 3.8 10*3/uL — ABNORMAL LOW (ref 4.0–10.5)
nRBC: 0 % (ref 0.0–0.2)

## 2021-05-28 LAB — COMPREHENSIVE METABOLIC PANEL
ALT: 18 U/L (ref 0–44)
AST: 24 U/L (ref 15–41)
Albumin: 2.8 g/dL — ABNORMAL LOW (ref 3.5–5.0)
Alkaline Phosphatase: 88 U/L (ref 38–126)
Anion gap: 8 (ref 5–15)
BUN: 18 mg/dL (ref 8–23)
CO2: 24 mmol/L (ref 22–32)
Calcium: 8.1 mg/dL — ABNORMAL LOW (ref 8.9–10.3)
Chloride: 104 mmol/L (ref 98–111)
Creatinine, Ser: 0.93 mg/dL (ref 0.44–1.00)
GFR, Estimated: >60 mL/min (ref >60)
Glucose, Bld: 122 mg/dL — ABNORMAL HIGH (ref 70–99)
Potassium: 4.1 mmol/L (ref 3.5–5.1)
Sodium: 136 mmol/L (ref 135–145)
Total Bilirubin: 0.8 mg/dL (ref 0.3–1.2)
Total Protein: 6.2 g/dL — ABNORMAL LOW (ref 6.5–8.1)

## 2021-05-28 LAB — C-REACTIVE PROTEIN: CRP: 1.1 mg/dL — ABNORMAL HIGH (ref <1.0)

## 2021-05-28 LAB — D-DIMER, QUANTITATIVE: D-Dimer, Quant: 1.05 ug/mL-FEU — ABNORMAL HIGH (ref 0.00–0.50)

## 2021-05-28 LAB — BRAIN NATRIURETIC PEPTIDE: B Natriuretic Peptide: 4.1 pg/mL (ref 0.0–100.0)

## 2021-05-28 LAB — MAGNESIUM: Magnesium: 2.3 mg/dL (ref 1.7–2.4)

## 2021-05-28 NOTE — TOC Progression Note (Signed)
Transition of Care [facility]) - Progression Note    Patient Details  Name: [name] [name] MRN: [id number] Date of Birth: 12-Sep-1946  Transition of Care [facility]) CM/SW [place], LCSW Phone Number: 05/28/2021, 12:53 PM  Clinical Narrative:    CSW spoke with [facility] inpatient [facility]. They are able to accept patient pending bed availability tomorrow. CSW updated patient's daughter. She is accepting of SNF if [facility] does not have a bed tomorrow. CSW checking with SNF on bed availability and will get update from [facility] in the morning. CSW provided daughter with info on how to apply for Medicaid through [place].    Expected Discharge Plan: Skilled Nursing Facility Barriers to Discharge: SNF Covid  Expected Discharge Plan and Services Expected Discharge Plan: [place] In-house Referral: Clinical Social Work     Living arrangements for the past 2 months: Single Family Home                                       Social Determinants of Health (SDOH) Interventions    Readmission Risk Interventions No flowsheet data found.

## 2021-05-28 NOTE — Progress Notes (Signed)
Occupational Therapy Treatment Patient Details Name: [name] [name] MRN: [id number] DOB: Mar 19, 1946 Today's Date: 05/28/2021    History of present illness [name] [name] is a 75 y.o. female who presents with concerns of not being able to walk on 05/23/21.  COVID was +.  Pt admitted with sepsis secondary to [place].  CT Head negative for acute infarct.  MRI is pending.  PHMX: CVA with residual right hemiparesis, metastatic breast cancer on palliative chemotherapy, carotid stenosis s/p left CEA, hypertension.   OT comments  Pt had gotten up to [facility] and chair with stedy and nursing staff earlier today. Pt declined OOB or EOB with OT, but requesting assistance with pericare and linen change. Max assist needed to roll, total assist for bed level pericare.  Continues to be appropriate for SNF level rehab.   Follow Up Recommendations  SNF;Supervision/Assistance - 24 hour    Equipment Recommendations  Other (comment) (defer to next venue)    Recommendations for Other Services      Precautions / Restrictions Precautions Precautions: Fall Precaution Comments: residual R weakness       Mobility Bed Mobility Overal bed mobility: Needs Assistance Bed Mobility: Rolling Rolling: Max assist         General bed mobility comments: cues to self assist, use of rail, increased time    Transfers                      Balance                                           ADL either performed or assessed with clinical judgement   ADL Overall ADL's : Needs assistance/impaired Eating/Feeding: Set up;Bed level                           Toileting- Clothing Manipulation and Hygiene: Total assistance;Bed level         General ADL Comments: Pt reporting wet linens, despite purewick. Assisted with pericare, linen change and replacing purewick.     Vision       Perception     Praxis      Cognition Arousal/Alertness: Awake/alert Behavior During Therapy:  Flat affect Overall Cognitive Status: History of cognitive impairments - at baseline                       Memory: Decreased short-term memory Following Commands: Follows one step commands with increased time     Problem Solving: Slow processing;Difficulty sequencing General Comments: step by step cues for rolling        Exercises     Shoulder Instructions       General Comments      Pertinent Vitals/ Pain       Pain Assessment: No/denies pain  Home Living                                          Prior Functioning/Environment              Frequency  Min 2X/week        Progress Toward Goals  OT Goals(current goals can now be found in the care plan section)  Progress towards OT goals: Progressing toward goals  Acute  Rehab OT Goals Patient Stated Goal: to get stronger again so I can walk by myself again OT Goal Formulation: With patient Time For Goal Achievement: 06/07/21 Potential to Achieve Goals: Good  Plan Discharge plan remains appropriate    Co-evaluation                 AM-PAC OT "6 Clicks" Daily Activity     Outcome Measure   Help from another person eating meals?: A Little Help from another person taking care of personal grooming?: A Little Help from another person toileting, which includes using toliet, bedpan, or urinal?: Total Help from another person bathing (including washing, rinsing, drying)?: A Lot Help from another person to put on and taking off regular upper body clothing?: A Lot Help from another person to put on and taking off regular lower body clothing?: Total 6 Click Score: 12    End of Session    OT Visit Diagnosis: Unsteadiness on feet (R26.81);Other abnormalities of gait and mobility (R26.89);Muscle weakness (generalized) (M62.81);Other symptoms and signs involving cognitive function;Hemiplegia and hemiparesis Hemiplegia - Right/Left: Right Hemiplegia - dominant/non-dominant:  Dominant Hemiplegia - caused by: Cerebral infarction   Activity Tolerance     Patient Left in bed;with call bell/phone within reach;with bed alarm set   Nurse Communication          Time: 1155-2080 OT Time Calculation (min): 14 min  Charges: OT General Charges $OT Visit: 1 Visit OT Treatments $Self Care/Home Management : 8-22 mins  Nestor Lewandowsky, OTR/L Acute Rehabilitation Services Pager: [phone] Office: [phone]  [place]alka So 05/28/2021, 3:11 PM

## 2021-05-28 NOTE — Plan of Care (Signed)

## 2021-05-28 NOTE — Progress Notes (Signed)
 PROGRESS NOTE  [name] [name] MRN:[id number] DOB: 01/23/1946 DOA: 05/23/2021 PCP: Collins, Dana, DO  HPI/Recap of past 24 hours:  75-year-old female with past medical history of CVA with residual right hemiparesis, metastatic breast cancer on palliative chemotherapy, carotid stenosis status post left CEA and hypertension who presented to the emergency department on 7/13 with worsening right-sided weakness.  In the hospital she was diagnosed with COVID-19 pneumonia and worsening of her previous hemiparesis due to COVID-19 stress, neurology saw the patient and she was admitted for further treatment.   Subjective - Patient in bed, appears comfortable, denies any headache, no fever, no chest pain or pressure, no shortness of breath , no abdominal pain. No new focal weakness.   Assessment/Plan:  Worsening of residual right-sided weakness from previous stroke caused by worsening of previous hemiparesis due to stress of COVID-19: Seen by neurology, stroke work-up done, on Plavix and statin, A1c stable LDL near goal.  Will qualify for SNF and agreeable for it.  Appreciate stroke input.  Sepsis (HCC) secondary to COVID-19 viral infection: Patient met criteria for sepsis given tachypnea and tachycardia from COVID-19 infection.  Fortunately not hypoxic.  Inflammatory markers are stable and she will get 3 days of IV remdesivir.  Sepsis pathophysiology has completely resolved.    Essential hypertension: On Norvasc, have added Coreg for better control.  Malignant neoplasm of overlapping sites of right breast in female, estrogen receptor positive (HCC): Appreciate wound care help.  Receiving palliative chemotherapy.  Follows with Dr. Magrinat.  Post discharge continue follow-up with him.  CKD (chronic kidney disease) stage 3, GFR 30-59 ml/min (HCC): Looks to be at baseline  Obesity (BMI 30-39.9): Meets criteria BMI greater than 30, follow with PCP for weight loss.  Mildly elevated D-dimer due to  COVID-19 inflammation.  Stable on Lovenox.  Lower extremity venous duplex negative.     Code Status: Full code  Family Communication: Daughter on 05/27/21  Disposition Plan: PT and OT recommending skilled nursing/CIR.   Consultants: Neurology  Procedures: None  Antimicrobials: IV Remdisivir 7/14 x 3 doses  DVT prophylaxis: Lovenox  Level of care: Med-Surg   Objective: Vitals:   05/28/21 0000 05/28/21 0751  BP: (!) 127/57 133/67  Pulse: 87 87  Resp: 18 19  Temp: 99 F (37.2 C) 98.1 F (36.7 C)  SpO2: 94% 94%    Intake/Output Summary (Last 24 hours) at 05/28/2021 1012 Last data filed at 05/28/2021 0854 Gross per 24 hour  Intake --  Output 1450 ml  Net -1450 ml   Filed Weights   05/23/21 1923 05/24/21 2200 05/26/21 0346  Weight: 102.4 kg 102.9 kg 102.4 kg   Body mass index is 33.34 kg/m.  Exam:  Awake Alert, No new F.N deficits, R side weaker than the left [place].AT,PERRAL Supple Neck,No JVD, No cervical lymphadenopathy appriciated.  Symmetrical Chest wall movement, Good air movement bilaterally, CTAB RRR,No Gallops, Rubs or new Murmurs, No Parasternal Heave +ve B.Sounds, Abd Soft, No tenderness, No organomegaly appriciated, No rebound - guarding or rigidity. No Cyanosis, Clubbing or edema, No new Rash or bruise    Data Reviewed:  Recent Labs  Lab 05/23/21 1925 05/23/21 1936 05/26/21 0807 05/27/21 0228 05/28/21 0102  WBC 8.7  --  4.0 4.6 3.8*  HGB 10.6* 10.5* 10.8* 11.4* 10.3*  HCT 35.1* 31.0* 34.5* 37.5 33.1*  PLT 185  --  156 PLATELET CLUMPS NOTED ON SMEAR, UNABLE TO ESTIMATE 172  MCV 81.1  --  77.9* 79.1* 77.7*  MCH 24.5*  --    24.4* 24.1* 24.2*  MCHC 30.2  --  31.3 30.4 31.1  RDW 18.8*  --  19.4* 19.9* 19.2*  LYMPHSABS 1.3  --   --  1.5 1.0  MONOABS 1.2*  --   --  0.6 0.5  EOSABS 0.1  --   --  0.1 0.2  BASOSABS 0.1  --   --  0.0 0.0    Recent Labs  Lab 05/23/21 1925 05/23/21 1936 05/24/21 0321 05/26/21 0807 05/27/21 0228  05/27/21 0230 05/28/21 0102  NA 139 139  --  136 135  --  136  K 3.7 3.7  --  3.9 4.2  --  4.1  CL 106 106  --  102 102  --  104  CO2 23  --   --  25 26  --  24  GLUCOSE 138* 134*  --  121* 116*  --  122*  BUN 16 15  --  17 19  --  18  CREATININE 1.12* 1.10*  --  0.99 1.04*  --  0.93  CALCIUM 8.9  --   --  8.2* 8.2*  --  8.1*  AST 20  --   --   --  29  --  24  ALT 18  --   --   --  19  --  18  ALKPHOS 115  --   --   --  85  --  88  BILITOT 0.7  --   --   --  0.5  --  0.8  ALBUMIN 3.6  --   --   --  2.9*  --  2.8*  MG  --   --   --  2.2 2.2  --  2.3  CRP  --   --   --  2.1* 1.4*  --  1.1*  DDIMER  --   --   --  1.22* 0.98*  --  1.05*  INR 1.1  --   --   --   --   --   --   HGBA1C  --   --  6.5*  --   --   --   --   BNP  --   --   --  4.4  --  45.4 4.1    Lab Results  Component Value Date   SARSCOV2NAA POSITIVE (A) 05/23/2021    Lab Results  Component Value Date   HGBA1C 6.5 (H) 05/24/2021   Lab Results  Component Value Date   CHOL 127 05/24/2021   HDL 48 05/24/2021   LDLCALC 73 05/24/2021   TRIG 31 05/24/2021   CHOLHDL 2.6 05/24/2021     Studies: No results found.  Scheduled Meds:   stroke: mapping our early stages of recovery book   Does not apply Once   amLODipine  10 mg Oral Daily   atorvastatin  80 mg Oral QPM   capecitabine  1,000 mg Oral Daily   carvedilol  3.125 mg Oral BID WC   Chlorhexidine Gluconate Cloth  6 each Topical Daily   clopidogrel  75 mg Oral Daily   enoxaparin (LOVENOX) injection  50 mg Subcutaneous Q24H   sodium chloride flush  3 mL Intravenous Once    Continuous Infusions:     LOS: 3 days   Signature  Lala Lund M.D on 05/28/2021 at 10:12 AM   -  To page go to [email or web address]

## 2021-05-29 MED ORDER — CARVEDILOL 3.125 MG PO TABS: 3.1250 mg | ORAL_TABLET | Freq: Two times a day (BID) | ORAL | Status: DC

## 2021-05-29 NOTE — Discharge Instructions (Addendum)
Follow with Primary MD [name], DO in 7 days   Get CBC, CMP, 2 view Chest X ray -  checked next visit within 1 week by Primary MD or SNF MD   Activity: As tolerated with Full fall precautions use walker/cane & assistance as needed  Disposition SNF  Diet: Heart Healthy  Low Carb    Special Instructions: If you have smoked or chewed Tobacco  in the last 2 yrs please stop smoking, stop any regular Alcohol  and or any Recreational drug use.  On your next visit with your primary care physician please Get Medicines reviewed and adjusted.  Please request your Prim.MD to go over all Hospital Tests and Procedure/Radiological results at the follow up, please get all Hospital records sent to your Prim MD by signing hospital release before you go home.  If you experience worsening of your admission symptoms, develop shortness of breath, life threatening emergency, suicidal or homicidal thoughts you must seek medical attention immediately by calling 911 or calling your MD immediately  if symptoms less severe.  You Must read complete instructions/literature along with all the possible adverse reactions/side effects for all the Medicines you take and that have been prescribed to you. Take any new Medicines after you have completely understood and accpet all the possible adverse reactions/side effects.

## 2021-05-29 NOTE — Discharge Summary (Signed)
Aljean Horiuchi ZOX:[id number] DOB: [birth year] DOA: 05/23/2021  PCP: [name], DO  Admit date: 05/23/2021  Discharge date: 05/29/2021  Admitted From: Home  Disposition:  SNF   Recommendations for Outpatient Follow-up:   Follow up with PCP in 1-2 weeks  PCP Please obtain BMP/CBC, 2 view CXR in 1week,  (see Discharge instructions)   PCP Please follow up on the following pending results:    Home Health: None   Equipment/Devices: None  Consultations: None  Discharge Condition: Stable    CODE STATUS: Full   Diet Recommendation: Heart Healthy - Low Carb  Diet Order             Diet - low sodium heart healthy           Diet heart healthy/carb modified Room service appropriate? Yes; Fluid consistency: Thin  Diet effective now                    Chief Complaint  Patient presents with   Code Stroke    Increasing right side weakness onset 1630 with questionable left side facial droop onset 1830.  H/o CVA with right side deficit and breast CA     Brief history of present illness from the day of admission and additional interim summary    75 year old female with past medical history of CVA with residual right hemiparesis, metastatic breast cancer on palliative chemotherapy, carotid stenosis status post left CEA and hypertension who presented to the emergency department on 7/13 with worsening right-sided weakness.  In the hospital she was diagnosed with COVID-19 pneumonia and worsening of her previous hemiparesis due to COVID-19 stress, neurology saw the patient and she was admitted for further treatment.                                                                 Hospital Course    Worsening of residual right-sided weakness from previous stroke caused by worsening of previous hemiparesis due to stress  of COVID-19: Seen by neurology, stroke work-up done, on Plavix and statin, A1c stable LDL near goal.  Will qualify for SNF and agreeable for it, DC to SNF today.  Appreciate stroke input.   Sepsis ([place]) secondary to COVID-19 viral infection: Patient met criteria for sepsis given tachypnea and tachycardia from COVID-19 infection.  Fortunately not hypoxic.  Inflammatory markers are stable and she will get 3 days of IV remdesivir.  Sepsis pathophysiology has completely resolved.     Essential hypertension: On Norvasc, have added Coreg for better control.   Malignant neoplasm of overlapping sites of right breast in female, estrogen receptor positive [facility]): Appreciate wound care help.  Receiving palliative chemotherapy.  Follows with Dr. Jana Hakim.  Post discharge continue follow-up with him in 1 to 2 weeks.   CKD (chronic kidney disease)  stage 3, GFR 30-59 ml/min ([place]): Looks to be at baseline   Obesity (BMI 30-39.9): Meets criteria BMI greater than 30, follow with PCP for weight loss.   Mildly elevated D-dimer due to COVID-19 inflammation.  Stable, was on prophylactic Lovenox.  Lower extremity venous duplex negative.   Discharge diagnosis     Principal Problem:   Sepsis ([place]) Active Problems:   Essential hypertension   Malignant neoplasm of overlapping sites of right breast in female, estrogen receptor positive ([place])   Weakness   History of CVA (cerebrovascular accident)   CKD (chronic kidney disease) stage 3, GFR 30-59 ml/min ([facility])   COVID-19 virus infection   Obesity (BMI 30-39.9)    Discharge instructions    Discharge Instructions     Diet - low sodium heart healthy   Complete by: As directed    Discharge instructions   Complete by: As directed    Follow with Primary MD [name], DO in 7 days   Get CBC, CMP, 2 view Chest X ray -  checked next visit within 1 week by Primary MD or SNF MD   Activity: As tolerated with Full fall precautions use walker/cane & assistance as  needed  Disposition SNF  Diet: Heart Healthy  Low Carb   Special Instructions: If you have smoked or chewed Tobacco  in the last 2 yrs please stop smoking, stop any regular Alcohol  and or any Recreational drug use.  On your next visit with your primary care physician please Get Medicines reviewed and adjusted.  Please request your Prim.MD to go over all Hospital Tests and Procedure/Radiological results at the follow up, please get all Hospital records sent to your Prim MD by signing hospital release before you go home.  If you experience worsening of your admission symptoms, develop shortness of breath, life threatening emergency, suicidal or homicidal thoughts you must seek medical attention immediately by calling 911 or calling your MD immediately  if symptoms less severe.  You Must read complete instructions/literature along with all the possible adverse reactions/side effects for all the Medicines you take and that have been prescribed to you. Take any new Medicines after you have completely understood and accpet all the possible adverse reactions/side effects.   Discharge wound care:   Complete by: As directed    Gently clean the area on the upper right breast with NS, then apply a [place] piece of Aquacel Advantage Kellie Simmering # [phone]) over this area, cover with a 4 x 4 and foam dressing. Change the Aquacel daily.       Discharge Medications   Allergies as of 05/29/2021   No Known Allergies      Medication List     STOP taking these medications    potassium chloride SA 20 MEQ tablet Commonly known as: KLOR-CON   pregabalin 25 MG capsule Commonly known as: LYRICA   traMADol 50 MG tablet Commonly known as: ULTRAM       TAKE these medications    amLODipine 10 MG tablet Commonly known as: NORVASC TAKE 1 TABLET BY MOUTH ONCE DAILY   atorvastatin 80 MG tablet Commonly known as: LIPITOR TAKE 1 TABLET BY MOUTH ONCE DAILY What changed:  how much to take when to take  this   capecitabine 500 MG tablet Commonly known as: XELODA Take 2 tablets (1,000 mg total) by mouth daily. Take within 30 minutes after breakfast.   carvedilol 3.125 MG tablet Commonly known as: COREG Take 1 tablet (3.125 mg total) by  mouth 2 (two) times daily with a meal.   clopidogrel 75 MG tablet Commonly known as: PLAVIX Take 1 tablet (75 mg total) by mouth daily.   latanoprost 0.005 % ophthalmic solution Commonly known as: XALATAN PLACE 1 DROP IN BOTH EYES EVERY EVENING   lisinopril-hydrochlorothiazide 20-12.5 MG tablet Commonly known as: ZESTORETIC Take 1 tablet by mouth daily   loratadine 10 MG tablet Commonly known as: CLARITIN Take 10 mg by mouth daily as needed for allergies.               Discharge Care Instructions  (From admission, onward)           Start     Ordered   05/29/21 0000  Discharge wound care:       Comments: Gently clean the area on the upper right breast with NS, then apply a [place] piece of Aquacel Advantage Kellie Simmering # [phone]) over this area, cover with a 4 x 4 and foam dressing. Change the Aquacel daily.   05/29/21 1341             Follow-up Information     [name], DO. Schedule an appointment as soon as possible for a visit in 1 week(s).   Specialty: Family Medicine Contact information: [address] [place] [address] [phone]         [place]. Schedule an appointment as soon as possible for a visit in 1 month(s).   Contact information: [address]     [place] [id number] [phone]                Major procedures and Radiology Reports - PLEASE review detailed and final reports thoroughly  -       CT Angio Head W or Wo Contrast  Result Date: 05/23/2021 CLINICAL DATA:  Initial evaluation for acute stroke, possible right-sided weakness. EXAM: CT ANGIOGRAPHY HEAD AND NECK CT PERFUSION BRAIN TECHNIQUE: Multidetector CT  imaging of the head and neck was performed using the standard protocol during bolus administration of intravenous contrast. Multiplanar CT image reconstructions and MIPs were obtained to evaluate the vascular anatomy. Carotid stenosis measurements (when applicable) are obtained utilizing NASCET criteria, using the distal internal carotid diameter as the denominator. Multiphase CT imaging of the brain was performed following IV bolus contrast injection. Subsequent parametric perfusion maps were calculated using RAPID software. CONTRAST:  187m OMNIPAQUE IOHEXOL 350 MG/ML SOLN COMPARISON:  Prior head CT from earlier the same day. FINDINGS: CTA NECK FINDINGS Aortic arch: Visualized aortic arch normal caliber with normal branch pattern. Atheromatous change about the arch and origin of the great vessels without hemodynamically significant stenosis. Right carotid system: Right CCA patent from its origin to the bifurcation without stenosis. Scattered mixed plaque about the right carotid bulb/proximal right ICA without significant stenosis. Right ICA patent distally without stenosis, dissection or occlusion. Left carotid system: Left CCA patent from its origin to the bifurcation without stenosis. Scattered mixed plaque about the left carotid bulb/proximal left ICA without stenosis. Left ICA patent distally without stenosis, dissection or occlusion. Vertebral arteries: Both vertebral arteries arise from the subclavian arteries. No proximal subclavian artery stenosis. Atheromatous irregularity with multifocal mild-to-moderate stenoses noted throughout the vertebral arteries bilaterally. Vertebral arteries remain patent within the neck without occlusion or other acute vascular abnormality. Skeleton: No visible acute osseous finding. No discrete or worrisome osseous lesions. Other neck: No other acute soft tissue abnormality within the neck. No discrete mass or adenopathy. Upper  chest: Infiltrating tumor involving the left  breast and upper right chest wall with overlying skin thickening partially visualized. Visualized upper chest demonstrates no other acute finding. Review of the MIP images confirms the above findings CTA HEAD FINDINGS Anterior circulation: Petrous segments patent bilaterally. Atheromatous change throughout the carotid siphons with associated moderate to severe multifocal stenoses. Left A1 segment patent. Right A1 hypoplastic and/or absent, accounting for the diminutive right ICA is compared to the left. Normal anterior communicating artery complex. Atheromatous change throughout the A2/A3 segments with associated moderate multifocal stenoses, worse on the right. M1 segments mildly irregular but patent without high-grade stenosis. Normal MCA bifurcations. Distal MCA branches well perfused and symmetric. Diffuse Baise vessel atheromatous irregularity. Posterior circulation: Atheromatous change about the proximal V4 segments with associated moderate stenosis on the right and more severe focal stenosis on the left (series 4, image 138). Basilar diminutive and irregular but patent to its distal aspect without stenosis. Superior cerebral arteries grossly patent at their origins. Both PCAs primarily supplied via the basilar. Atheromatous irregularity throughout the PCAs bilaterally. Short-segment proximal moderate right P2 stenosis (series 5, image 90). PCAs otherwise patent to their distal aspects without high-grade stenosis. Venous sinuses: Grossly patent allowing for timing the contrast bolus. Anatomic variants: Hypoplastic/absent right A1. No visible aneurysm. Review of the MIP images confirms the above findings CT Brain Perfusion Findings: ASPECTS: 10 CBF (<30%) Volume: 23m Perfusion (Tmax>6.0s) volume: 051mMismatch Volume: 31m58mnfarction Location:Negative CT perfusion with no evidence for acute core infarct or other perfusion deficit. IMPRESSION: 1. Negative CTA for emergent large vessel occlusion. 2. Negative CT  perfusion with no evidence for acute core infarct or other perfusion deficit. 3. Extensive atherosclerotic change throughout the intracranial circulation. Associated moderate to severe stenoses throughout the carotid siphons, with moderate to severe bilateral proximal V4 stenoses. 4. Additional scattered atherosclerotic change elsewhere about the major arterial vasculature of the head and neck as above. No other hemodynamically significant or correctable stenosis. 5. 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 These results were communicated to Dr. LinCheral Marker 8:09 pm on 05/23/2021 by text page via the [facility] system. Electronically Signed   By: BenJeannine BogaD.   On: 05/23/2021 20:39   CT Angio Neck W and/or Wo Contrast  Result Date: 05/23/2021 CLINICAL DATA:  Initial evaluation for acute stroke, possible right-sided weakness. EXAM: CT ANGIOGRAPHY HEAD AND NECK CT PERFUSION BRAIN TECHNIQUE: Multidetector CT imaging of the head and neck was performed using the standard protocol during bolus administration of intravenous contrast. Multiplanar CT image reconstructions and MIPs were obtained to evaluate the vascular anatomy. Carotid stenosis measurements (when applicable) are obtained utilizing NASCET criteria, using the distal internal carotid diameter as the denominator. Multiphase CT imaging of the brain was performed following IV bolus contrast injection. Subsequent parametric perfusion maps were calculated using RAPID software. CONTRAST:  1031m5mNIPAQUE IOHEXOL 350 MG/ML SOLN COMPARISON:  Prior head CT from earlier the same day. FINDINGS: CTA NECK FINDINGS Aortic arch: Visualized aortic arch normal caliber with normal branch pattern. Atheromatous change about the arch and origin of the great vessels without hemodynamically significant stenosis. Right carotid system: Right CCA patent from its origin to the bifurcation without stenosis. Scattered  mixed plaque about the right carotid bulb/proximal right ICA without significant stenosis. Right ICA patent distally without stenosis, dissection or occlusion. Left carotid system: Left CCA patent from its origin to the bifurcation without stenosis. Scattered mixed plaque about the left carotid bulb/proximal  left ICA without stenosis. Left ICA patent distally without stenosis, dissection or occlusion. Vertebral arteries: Both vertebral arteries arise from the subclavian arteries. No proximal subclavian artery stenosis. Atheromatous irregularity with multifocal mild-to-moderate stenoses noted throughout the vertebral arteries bilaterally. Vertebral arteries remain patent within the neck without occlusion or other acute vascular abnormality. Skeleton: No visible acute osseous finding. No discrete or worrisome osseous lesions. Other neck: No other acute soft tissue abnormality within the neck. No discrete mass or adenopathy. Upper chest: Infiltrating tumor involving the left breast and upper right chest wall with overlying skin thickening partially visualized. Visualized upper chest demonstrates no other acute finding. Review of the MIP images confirms the above findings CTA HEAD FINDINGS Anterior circulation: Petrous segments patent bilaterally. Atheromatous change throughout the carotid siphons with associated moderate to severe multifocal stenoses. Left A1 segment patent. Right A1 hypoplastic and/or absent, accounting for the diminutive right ICA is compared to the left. Normal anterior communicating artery complex. Atheromatous change throughout the A2/A3 segments with associated moderate multifocal stenoses, worse on the right. M1 segments mildly irregular but patent without high-grade stenosis. Normal MCA bifurcations. Distal MCA branches well perfused and symmetric. Diffuse [place] vessel atheromatous irregularity. Posterior circulation: Atheromatous change about the proximal V4 segments with associated moderate  stenosis on the right and more severe focal stenosis on the left (series 4, image 138). Basilar diminutive and irregular but patent to its distal aspect without stenosis. Superior cerebral arteries grossly patent at their origins. Both PCAs primarily supplied via the basilar. Atheromatous irregularity throughout the PCAs bilaterally. Short-segment proximal moderate right P2 stenosis (series 5, image 90). PCAs otherwise patent to their distal aspects without high-grade stenosis. Venous sinuses: Grossly patent allowing for timing the contrast bolus. Anatomic variants: Hypoplastic/absent right A1. No visible aneurysm. Review of the MIP images confirms the above findings CT Brain Perfusion Findings: ASPECTS: 10 CBF (<30%) Volume: 86m Perfusion (Tmax>6.0s) volume: 074mMismatch Volume: 34m8mnfarction Location:Negative CT perfusion with no evidence for acute core infarct or other perfusion deficit. IMPRESSION: 1. Negative CTA for emergent large vessel occlusion. 2. Negative CT perfusion with no evidence for acute core infarct or other perfusion deficit. 3. Extensive atherosclerotic change throughout the intracranial circulation. Associated moderate to severe stenoses throughout the carotid siphons, with moderate to severe bilateral proximal V4 stenoses. 4. Additional scattered atherosclerotic change elsewhere about the major arterial vasculature of the head and neck as above. No other hemodynamically significant or correctable stenosis. 5. 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 These results were communicated to Dr. LinCheral Marker 8:09 pm on 05/23/2021 by text page via the [facility] system. Electronically Signed   By: BenJeannine BogaD.   On: 05/23/2021 20:39   MR BRAIN W WO CONTRAST  Result Date: 05/24/2021 CLINICAL DATA:  Stroke follow-up. EXAM: MRI HEAD WITHOUT AND WITH CONTRAST TECHNIQUE: Multiplanar, multiecho pulse sequences of the brain and  surrounding structures were obtained without and with intravenous contrast. CONTRAST:  134m74mDAVIST GADOBUTROL 1 MMOL/ML IV SOLN COMPARISON:  CT exams from yesterday. 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 Remote cortical infarcts in the left frontal and left parietal cortex. Remote lacunar infarcts in bilateral corona radiata and basal ganglia, right thalamus, bilateral cerebellar hemispheres, and likely the pons. Additional moderate to advanced patchy white matter T2 hyperintensity, compatible with chronic microvascular ischemic disease. Moderate supratentorial and marked cerebellar atrophy with ex vacuo ventricular dilation. Lose foci of susceptibility artifact in the right thalamus,  left frontal cortex, and right temporal lobe, compatible with the sequela of prior microhemorrhage. Prominent pituitary gland with upwardly convex margin, similar when compared to the cervical spine from 2018 and homogeneously enhancing. No evidence of abnormal enhancement on the motion limited postcontrast imaging. No midline shift. No extra-axial fluid collections. No hydrocephalus. Vascular: Major arterial flow voids are maintained at the skull base. Skull and upper cervical spine: Normal marrow signal. Sinuses/Orbits: Sinuses are largely clear.  Unremarkable orbits. Other: No sizable mastoid effusions. IMPRESSION: 1. No evidence of acute intracranial abnormality. Specifically, no acute infarct. 2. Multiple remote infratentorial and supratentorial infarcts (as detailed above) with moderate to severe chronic microvascular ischemic disease. 3. Moderate supratentorial and marked cerebellar atrophy with ex vacuo ventricular dilation. 4. Scattered prior microhemorrhages, detailed above and potentially related to hypertension or amyloid angiopathy. 5. Prominent pituitary gland with upwardly convex margin, which appears similar to the 2018 mri of the cervical spine. Electronically Signed    By: Margaretha Sheffield MD   On: 05/24/2021 16:57   CT CEREBRAL PERFUSION W CONTRAST  Result Date: 05/23/2021 CLINICAL DATA:  Initial evaluation for acute stroke, possible right-sided weakness. EXAM: CT ANGIOGRAPHY HEAD AND NECK CT PERFUSION BRAIN TECHNIQUE: Multidetector CT imaging of the head and neck was performed using the standard protocol during bolus administration of intravenous contrast. Multiplanar CT image reconstructions and MIPs were obtained to evaluate the vascular anatomy. Carotid stenosis measurements (when applicable) are obtained utilizing NASCET criteria, using the distal internal carotid diameter as the denominator. Multiphase CT imaging of the brain was performed following IV bolus contrast injection. Subsequent parametric perfusion maps were calculated using RAPID software. CONTRAST:  134m OMNIPAQUE IOHEXOL 350 MG/ML SOLN COMPARISON:  Prior head CT from earlier the same day. FINDINGS: CTA NECK FINDINGS Aortic arch: Visualized aortic arch normal caliber with normal branch pattern. Atheromatous change about the arch and origin of the great vessels without hemodynamically significant stenosis. Right carotid system: Right CCA patent from its origin to the bifurcation without stenosis. Scattered mixed plaque about the right carotid bulb/proximal right ICA without significant stenosis. Right ICA patent distally without stenosis, dissection or occlusion. Left carotid system: Left CCA patent from its origin to the bifurcation without stenosis. Scattered mixed plaque about the left carotid bulb/proximal left ICA without stenosis. Left ICA patent distally without stenosis, dissection or occlusion. Vertebral arteries: Both vertebral arteries arise from the subclavian arteries. No proximal subclavian artery stenosis. Atheromatous irregularity with multifocal mild-to-moderate stenoses noted throughout the vertebral arteries bilaterally. Vertebral arteries remain patent within the neck without occlusion  or other acute vascular abnormality. Skeleton: No visible acute osseous finding. No discrete or worrisome osseous lesions. Other neck: No other acute soft tissue abnormality within the neck. No discrete mass or adenopathy. Upper chest: Infiltrating tumor involving the left breast and upper right chest wall with overlying skin thickening partially visualized. Visualized upper chest demonstrates no other acute finding. Review of the MIP images confirms the above findings CTA HEAD FINDINGS Anterior circulation: Petrous segments patent bilaterally. Atheromatous change throughout the carotid siphons with associated moderate to severe multifocal stenoses. Left A1 segment patent. Right A1 hypoplastic and/or absent, accounting for the diminutive right ICA is compared to the left. Normal anterior communicating artery complex. Atheromatous change throughout the A2/A3 segments with associated moderate multifocal stenoses, worse on the right. M1 segments mildly irregular but patent without high-grade stenosis. Normal MCA bifurcations. Distal MCA branches well perfused and symmetric. Diffuse [place] vessel atheromatous irregularity. Posterior circulation: Atheromatous change about the proximal V4  segments with associated moderate stenosis on the right and more severe focal stenosis on the left (series 4, image 138). Basilar diminutive and irregular but patent to its distal aspect without stenosis. Superior cerebral arteries grossly patent at their origins. Both PCAs primarily supplied via the basilar. Atheromatous irregularity throughout the PCAs bilaterally. Short-segment proximal moderate right P2 stenosis (series 5, image 90). PCAs otherwise patent to their distal aspects without high-grade stenosis. Venous sinuses: Grossly patent allowing for timing the contrast bolus. Anatomic variants: Hypoplastic/absent right A1. No visible aneurysm. Review of the MIP images confirms the above findings CT Brain Perfusion Findings: ASPECTS:  10 CBF (<30%) Volume: 623m Perfusion (Tmax>6.0s) volume: 012mMismatch Volume: 23m46mnfarction Location:Negative CT perfusion with no evidence for acute core infarct or other perfusion deficit. IMPRESSION: 1. Negative CTA for emergent large vessel occlusion. 2. Negative CT perfusion with no evidence for acute core infarct or other perfusion deficit. 3. Extensive atherosclerotic change throughout the intracranial circulation. Associated moderate to severe stenoses throughout the carotid siphons, with moderate to severe bilateral proximal V4 stenoses. 4. Additional scattered atherosclerotic change elsewhere about the major arterial vasculature of the head and neck as above. No other hemodynamically significant or correctable stenosis. 5. 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 These results were communicated to Dr. LinCheral Marker 8:09 pm on 05/23/2021 by text page via the [facility] system. Electronically Signed   By: BenJeannine BogaD.   On: 05/23/2021 20:39   DG Chest Port 1 View  Result Date: 05/26/2021 CLINICAL DATA:  [age]ar old female with history of shortness of breath. COVID positive patient. EXAM: PORTABLE CHEST 1 VIEW COMPARISON:  Chest x-ray 05/23/2021. FINDINGS: Lung volumes are normal. Irregular opacities in the left lung base which may reflect areas of atelectasis and/or consolidation. Right lung appears clear. Specifically, the peripheral lobulated opacity in the right upper lobe seen on the prior study is no longer clearly identifiable. No definite pleural effusions. No pneumothorax. No evidence of pulmonary edema. Heart size is borderline enlarged. Upper mediastinal contours are within normal limits. Left-sided internal jugular single-lumen porta cath with tip terminating at the superior cavoatrial junction. IMPRESSION: 1. Atelectasis and/or consolidation in the left lower lobe. 2. Previously noted nodular density projecting over the  lateral aspect of the right upper lobe is no longer readily apparent on today's examination. 3. Aortic atherosclerosis. Electronically Signed   By: DanVinnie LangtonD.   On: 05/26/2021 09:16   DG Chest Portable 1 View  Result Date: 05/23/2021 CLINICAL DATA:  [age]ar old female with weakness. EXAM: PORTABLE CHEST 1 VIEW COMPARISON:  Chest radiograph dated 09/07/2018. FINDINGS: Left-sided Port-A-Cath with tip at the cavoatrial junction. Mild diffuse chronic interstitial coarsening. No focal consolidations crash that there is a 2.8 x 2.0 cm indeterminate lobulated pleural base consolidation along the lateral right upper lobe not seen on the prior radiograph or CT. Further evaluation with chest CT is recommended. There is no pleural effusion pneumothorax. The cardiac silhouette is within limits. Atherosclerotic calcification of the aorta. No acute osseous pathology. IMPRESSION: Lobulated pleural base consolidation/nodule along the lateral right upper lobe. Further evaluation with chest CT is recommended. Electronically Signed   By: AraAnner CreteD.   On: 05/23/2021 21:43   ECHOCARDIOGRAM COMPLETE  Result Date: 05/24/2021    ECHOCARDIOGRAM REPORT   [facility]am: 05/24/2021 Medical Rec #:  [id number]  Height:       66.0 in Accession #:  [id number]    Weight:       225.7 lb Date of Birth:  [birth year]     BSA:          2.105 m Patient Age:    3 years      BP:           160/79 mmHg Patient Gender: F             HR:           100 bpm. Exam Location:  Inpatient Procedure: 2D Echo, Cardiac Doppler, Color Doppler and Intracardiac            Opacification Agent Indications:    Stroke I63.9  History:        Patient has prior history of Echocardiogram examinations, most                 recent 04/03/2017. Carotid Disease and Stroke; Risk                 Factors:Hypertension. Metastatic breast cancer.  Sonographer:    [name] RDCS Referring Phys: [id number] [facility]  Sonographer  Comments: Technically difficult study due to poor echo windows, suboptimal parasternal window, suboptimal apical window and suboptimal subcostal window. IMPRESSIONS  1. Left ventricular ejection fraction, by estimation, is 65 to 70%. The left ventricle has normal function. Left ventricular endocardial border not optimally defined to evaluate regional wall motion. Left ventricular diastolic parameters are consistent with Grade I diastolic dysfunction (impaired relaxation).  2. Right ventricular systolic function was not well visualized. The right ventricular size is not well visualized. Tricuspid regurgitation signal is inadequate for assessing PA pressure.  3. The mitral valve is normal in structure. No evidence of mitral valve regurgitation. No evidence of mitral stenosis. Moderate mitral annular calcification.  4. The aortic valve is tricuspid. Aortic valve regurgitation is not visualized. Mild to moderate aortic valve sclerosis/calcification is present, without any evidence of aortic stenosis.  5. The inferior vena cava is normal in size with greater than 50% respiratory variability, suggesting right atrial pressure of 3 mmHg.  6. Technically difficult study with very poor acoustic windows. FINDINGS  Left Ventricle: Left ventricular ejection fraction, by estimation, is 65 to 70%. The left ventricle has normal function. Left ventricular endocardial border not optimally defined to evaluate regional wall motion. Definity contrast agent was given IV to delineate the left ventricular endocardial borders. The left ventricular internal cavity size was normal in size. There is no left ventricular hypertrophy. Left ventricular diastolic parameters are consistent with Grade I diastolic dysfunction (impaired relaxation). Right Ventricle: The right ventricular size is not well visualized. Right vetricular wall thickness was not well visualized. Right ventricular systolic function was not well visualized. Tricuspid  regurgitation signal is inadequate for assessing PA pressure. Left Atrium: Left atrial size was not well visualized. Right Atrium: Right atrial size was not well visualized. Pericardium: There is no evidence of pericardial effusion. Mitral Valve: The mitral valve is normal in structure. Moderate mitral annular calcification. No evidence of mitral valve regurgitation. No evidence of mitral valve stenosis. Tricuspid Valve: The tricuspid valve is not well visualized. Tricuspid valve regurgitation is not demonstrated. Aortic Valve: The aortic valve is tricuspid. Aortic valve regurgitation is not visualized. Mild to moderate aortic valve sclerosis/calcification is present, without any evidence of aortic stenosis. Pulmonic Valve: The pulmonic valve was not well visualized. Pulmonic valve regurgitation is not visualized. Aorta: The aortic root is normal in size and structure. Venous: The inferior  vena cava is normal in size with greater than 50% respiratory variability, suggesting right atrial pressure of 3 mmHg. IAS/Shunts: The interatrial septum was not well visualized.  LEFT VENTRICLE PLAX 2D LVOT diam:     1.90 cm  Diastology LV SV:         30       LV e' medial:    9.28 cm/s LV SV Index:   14       LV E/e' medial:  8.8 LVOT Area:     2.84 cm LV e' lateral:   6.81 cm/s                         LV E/e' lateral: 12.0  AORTIC VALVE LVOT Vmax:   75.00 cm/s LVOT Vmean:  51.700 cm/s LVOT VTI:    0.105 m  AORTA Ao Root diam: 3.05 cm MITRAL VALVE MV Area (PHT): 3.99 cm    SHUNTS MV Decel Time: 190 msec    Systemic VTI:  0.10 m MV E velocity: 82.00 cm/s  Systemic Diam: 1.90 cm MV A velocity: 93.20 cm/s MV E/A ratio:  0.88 Loralie Champagne MD Electronically signed by Loralie Champagne MD Signature Date/Time: 05/24/2021/4:24:14 PM    Final    CT HEAD CODE STROKE WO CONTRAST  Result Date: 05/23/2021 CLINICAL DATA:  Code stroke. Stroke. New onset of right-sided weakness. EXAM: CT HEAD WITHOUT CONTRAST TECHNIQUE: Contiguous axial  images were obtained from the base of the skull through the vertex without intravenous contrast. COMPARISON:  CT head without contrast 04/04/2017 FINDINGS: Brain: A remote inferior right occipital lobe infarct is new since the prior study, but not acute. A left parietal lobe cortical infarct demonstrates volume loss and also appears remote. Remote white matter infarct of the genu of the right internal capsule again noted. Remote infarct of the posterior left frontal lobe appeared more acute on the prior exam. No acute cortical infarcts are present. No hemorrhage or mass lesion is present. Progressive moderate periventricular white matter disease is present. No acute hemorrhage or mass lesion is present. Remote lacunar infarcts are present in the left cerebellum. The ventricles are proportionate to the degree of atrophy. No significant extraaxial fluid collection is present. Vascular: Atherosclerotic calcifications are present within the cavernous internal carotid arteries. No hyperdense vessel is present. Skull: Insert normal skull No significant extracranial soft tissue lesion is present. Sinuses/Orbits: The paranasal sinuses and mastoid air cells are clear. The globes and orbits are within normal limits. ASPECTS [facility] Stroke Program Early CT Score) - Ganglionic level infarction (caudate, lentiform nuclei, internal capsule, insula, M1-M3 cortex): 7/7 - Supraganglionic infarction (M4-M6 cortex): 3/3 Total score (0-10 with 10 being normal): 10/10 IMPRESSION: 1. No definite acute intracranial abnormality. 2. Left frontal lobe infarct appears remote with volume loss. There was evidence for acute infarct in this area on the prior study. 3. Multiple remote infarcts as described. 4. Progressive moderate atrophy and white matter disease. This likely reflects the sequela of chronic microvascular ischemia. 5. Aspects 10/10 The above was relayed via text pager to Dr. Cheral Marker on 05/23/2021 at 19:43 . Electronically Signed    By: San Morelle M.D.   On: 05/23/2021 19:44   VAS [place] LOWER EXTREMITY VENOUS (DVT)  Result Date: 05/26/2021  Lower Venous DVT Study Patient [facility]:  [facility]  Date of Exam:   05/26/2021 Medical Rec #: [id number]      Accession #:    [id number] Date of Birth: 27-Sep-1946  Patient Gender: F Patient Age:   65Y Exam Location:  [facility] Procedure:      VAS [place] LOWER EXTREMITY VENOUS (DVT) Referring Phys: [id number] Margaree Mackintosh [facility] --------------------------------------------------------------------------------  Indications: D-dimer, Covid-19.  Comparison Study: 04-03-2017 Prior bilateral lower extremity venous was negative                   for DVT. Performing Technologist: Darlin Coco RDMS,RVT  Examination Guidelines: A complete evaluation includes B-mode imaging, spectral Doppler, color Doppler, and power Doppler as needed of all accessible portions of each vessel. Bilateral testing is considered an integral part of a complete examination. Limited examinations for reoccurring indications may be performed as noted. The reflux portion of the exam is performed with the patient in reverse Trendelenburg.  +---------+---------------+---------+-----------+----------+--------------+ RIGHT    CompressibilityPhasicitySpontaneityPropertiesThrombus Aging +---------+---------------+---------+-----------+----------+--------------+ CFV      Full           Yes      Yes                                 +---------+---------------+---------+-----------+----------+--------------+ SFJ      Full                                                        +---------+---------------+---------+-----------+----------+--------------+ FV Prox  Full                                                        +---------+---------------+---------+-----------+----------+--------------+ FV Mid   Full                                                         +---------+---------------+---------+-----------+----------+--------------+ FV DistalFull                                                        +---------+---------------+---------+-----------+----------+--------------+ PFV      Full                                                        +---------+---------------+---------+-----------+----------+--------------+ POP      Full           Yes      Yes                                 +---------+---------------+---------+-----------+----------+--------------+ PTV      Full                                                        +---------+---------------+---------+-----------+----------+--------------+  PERO     Full                                                        +---------+---------------+---------+-----------+----------+--------------+   +---------+---------------+---------+-----------+----------+--------------+ LEFT     CompressibilityPhasicitySpontaneityPropertiesThrombus Aging +---------+---------------+---------+-----------+----------+--------------+ CFV      Full           Yes      Yes                                 +---------+---------------+---------+-----------+----------+--------------+ SFJ      Full                                                        +---------+---------------+---------+-----------+----------+--------------+ FV Prox  Full                                                        +---------+---------------+---------+-----------+----------+--------------+ FV Mid   Full                                                        +---------+---------------+---------+-----------+----------+--------------+ FV DistalFull                                                        +---------+---------------+---------+-----------+----------+--------------+ PFV      Full                                                         +---------+---------------+---------+-----------+----------+--------------+ POP      Full           Yes      Yes                                 +---------+---------------+---------+-----------+----------+--------------+ PTV      Full                                                        +---------+---------------+---------+-----------+----------+--------------+ PERO     Full                                                        +---------+---------------+---------+-----------+----------+--------------+  Summary: RIGHT: - There is no evidence of deep vein thrombosis in the lower extremity.  - No cystic structure found in the popliteal fossa.  LEFT: - There is no evidence of deep vein thrombosis in the lower extremity.  - No cystic structure found in the popliteal fossa.  *See table(s) above for measurements and observations. Electronically signed by Monica Martinez MD on 05/26/2021 at 4:31:50 PM.    Final     Micro Results     Recent Results (from the past 240 hour(s))  Resp Panel by RT-PCR (Flu A&B, Covid) Urine, Clean Catch     Status: Abnormal   Collection Time: 05/23/21  9:49 PM   Specimen: Urine, Clean Catch; Nasopharyngeal(NP) swabs in vial transport medium  Result Value Ref Range Status   SARS Coronavirus 2 by RT PCR POSITIVE (A) NEGATIVE Final    Comment: RESULT CALLED TO, READ BACK BY AND VERIFIED WITH: Letta Pate RN, AT 9528 05/24/21 D. VANHOOK (NOTE) SARS-CoV-2 target nucleic acids are DETECTED.  The SARS-CoV-2 RNA is generally detectable in upper respiratory specimens during the acute phase of infection. Positive results are indicative of the presence of the identified virus, but do not rule out bacterial infection or co-infection with other pathogens not detected by the test. Clinical correlation with patient history and other diagnostic information is necessary to determine patient infection status. The expected result is Negative.  Fact Sheet for  Patients: EntrepreneurPulse.com.au  Fact Sheet for Healthcare Providers: IncredibleEmployment.be  This test is not yet approved or cleared by the [place] FDA and  has been authorized for detection and/or diagnosis of SARS-CoV-2 by FDA under an Emergency Use Authorization (EUA).  This EUA will remain in effect (meaning this tes t can be used) for the duration of  the COVID-19 declaration under Section 564(b)(1) of the Act, 21 U.S.C. section 360bbb-3(b)(1), unless the authorization is terminated or revoked sooner.     Influenza A by PCR NEGATIVE NEGATIVE Final   Influenza B by PCR NEGATIVE NEGATIVE Final    Comment: (NOTE) The Xpert Xpress SARS-CoV-2/FLU/RSV plus assay is intended as an aid in the diagnosis of influenza from Nasopharyngeal swab specimens and should not be used as a sole basis for treatment. Nasal washings and aspirates are unacceptable for Xpert Xpress SARS-CoV-2/FLU/RSV testing.  Fact Sheet for Patients: EntrepreneurPulse.com.au  Fact Sheet for Healthcare Providers: IncredibleEmployment.be  This test is not yet approved or cleared by the [place] FDA and has been authorized for detection and/or diagnosis of SARS-CoV-2 by FDA under an Emergency Use Authorization (EUA). This EUA will remain in effect (meaning this test can be used) for the duration of the COVID-19 declaration under Section 564(b)(1) of the Act, 21 U.S.C. section 360bbb-3(b)(1), unless the authorization is terminated or revoked.  Performed at [facility], [place] [address], [place], [place] [address]     Today   Subjective    Nihal Doan today has no headache,no chest abdominal pain,no new weakness tingling or numbness, feels much better.   Objective   Blood pressure 136/62, pulse 93, temperature 98 F (36.7 C), temperature source Oral, resp. rate 18, height 5' 9" (1.753 m), weight 102.4 kg, SpO2 94  %.  No intake or output data in the 24 hours ending 05/29/21 1341  Exam  Awake Alert, No new F.N deficits, mild R. Sided weakness  [place].AT,PERRAL Supple Neck,No JVD, No cervical lymphadenopathy appriciated.  Symmetrical Chest wall movement, Good air movement bilaterally, CTAB RRR,No Gallops,Rubs or new Murmurs, No  Parasternal Heave +ve B.Sounds, Abd Soft, Non tender, No organomegaly appriciated, No rebound -guarding or rigidity. No Cyanosis, Clubbing or edema, No new Rash or bruise, R breast wound under bandage   Data Review   CBC w Diff:  Lab Results  Component Value Date   WBC 3.8 (L) 05/28/2021   HGB 10.3 (L) 05/28/2021   HGB 10.4 (L) 04/26/2021   HCT 33.1 (L) 05/28/2021   PLT 172 05/28/2021   PLT 213 04/26/2021   LYMPHOPCT 27 05/28/2021   BANDSPCT 8 11/03/2018   MONOPCT 14 05/28/2021   EOSPCT 5 05/28/2021   BASOPCT 1 05/28/2021    CMP:  Lab Results  Component Value Date   NA 136 05/28/2021   K 4.1 05/28/2021   CL 104 05/28/2021   CO2 24 05/28/2021   BUN 18 05/28/2021   CREATININE 0.93 05/28/2021   CREATININE 1.19 (H) 04/26/2021   PROT 6.2 (L) 05/28/2021   ALBUMIN 2.8 (L) 05/28/2021   BILITOT 0.8 05/28/2021   BILITOT 0.3 04/26/2021   ALKPHOS 88 05/28/2021   AST 24 05/28/2021   AST 18 04/26/2021   ALT 18 05/28/2021   ALT 19 04/26/2021  .   Total Time in preparing paper work, data evaluation and todays exam - 67 minutes  Lala Lund M.D on 05/29/2021 at 1:41 PM  [facility]

## 2021-05-29 NOTE — TOC Progression Note (Addendum)
Transition of Care [facility]) - Progression Note    Patient Details  Name: [name] [name] MRN: [id number] Date of Birth: 07/26/1946  Transition of Care [facility]) CM/SW [place], LCSW Phone Number: 05/29/2021, 9:24 AM  Clinical Narrative:    9:24am-Planning for patient to discharge today to either [facility] or [facility]. Awaiting confirmation of bed availability from [facility].   9:41am-Per [facility], they will not have a bed today. CSW notified patient's daughter and she reported understanding. CSW notified [facility] and requested they contact daughter to answer questions.    Expected Discharge Plan: Skilled Nursing Facility Barriers to Discharge: SNF Covid  Expected Discharge Plan and Services Expected Discharge Plan: [facility] In-house Referral: Clinical Social Work     Living arrangements for the past 2 months: Single Family Home Expected Discharge Date: 05/29/21                                     Social Determinants of Health (SDOH) Interventions    Readmission Risk Interventions No flowsheet data found.

## 2021-05-29 NOTE — Plan of Care (Signed)

## 2021-05-29 NOTE — Care Management Important Message (Signed)
Important Message  Patient Details  Name: [name] [name] MRN: [id number] Date of Birth: [birth year]   Medicare Important Message Given:  Yes - Important Message mailed due to current National Emergency   Verbal consent obtained due to current National Emergency  Relationship to patient: Self Contact Name: Kelci Call Date: 05/29/21  Time: 1353 Phone: [phone] Outcome: No Answer/Busy Important Message mailed to: Patient address on file    Delorse Lek 05/29/2021, 1:53 PM

## 2021-05-29 NOTE — TOC Transition Note (Signed)
Transition of Care [facility]) - CM/SW Discharge Note   Patient Details  Name: [name] [name] MRN: [id number] Date of Birth: [birth year]  Transition of Care [facility]) CM/SW Contact:  Benard Halsted, LCSW Phone Number: 05/29/2021, 2:09 PM   Clinical Narrative:    Patient will DC to: [facility] Anticipated DC date: 05/29/21 Family notified: Daughter, Research officer, political party by: Corey Harold  CSW requested RN make sure patient's home [place] be packed up with her in transport to SNF.   Per MD patient ready for DC to [facility]. RN to call report prior to discharge [phone] Room 808P). RN, patient, patient's family, and facility notified of DC. Discharge Summary and FL2 sent to facility. DC packet on chart. Ambulance transport requested for patient.   CSW will sign off for now as social work intervention is no longer needed. Please consult [place] again if new needs arise.     Final next level of care: Skilled Nursing Facility Barriers to Discharge: Barriers Resolved   Patient Goals and CMS Choice Patient states their goals for this hospitalization and ongoing recovery are:: Rehab CMS Medicare.gov Compare Post Acute Care list provided to:: Patient Choice offered to / list presented to : Patient, Adult Children  Discharge Placement   Existing PASRR number confirmed : 05/29/21          Patient [facility] bed at: [facility] Patient to be transferred to facility by: [place] Name of family member notified: Daughter Patient and family notified of of transfer: 05/29/21  Discharge Plan and Services In-house Referral: Clinical Social Work                                   Social Determinants of Health ([place]) Interventions     Readmission Risk Interventions No flowsheet data found.

## 2021-05-29 NOTE — Progress Notes (Signed)
Patient dressed with gown. IV removed without complications. All belongings placed in persona bag. AVS printed and placed in patient envelope for patient transport to rehab. No complaints or concerns stated at this time.

## 2021-05-29 NOTE — Progress Notes (Signed)
Physical Therapy Treatment Patient Details Name: [name] [name] MRN: [id number] DOB: 1946-09-28 Today's Date: 05/29/2021    History of Present Illness [name] [name] is a 75 y.o. female who presents with concerns of not being able to walk on 05/23/21.  COVID was +.  Pt admitted with sepsis secondary to [place].  CT Head negative for acute infarct.  MRI is pending.  PHMX: CVA with residual right hemiparesis, metastatic breast cancer on palliative chemotherapy, carotid stenosis s/p left CEA, hypertension.    PT Comments    Patient received in recliner, very pleasant and cooperative with therapy. Still requires heavy physical assist to come to full standing, however once up she was able to tolerate progression of gait training with one seated rest break today! Continues to demonstrate gait deviations as below, but VSS on RA and very motivated to work and improve. Does have slow initiation and response time, also still needs repeated simple cues and hand over hand placement for safe transfers today. Left up in recliner with all needs met. Progression well!    Follow Up Recommendations  SNF     Equipment Recommendations  Rolling walker with 5" wheels;Wheelchair cushion (measurements PT);3in1 (PT);Wheelchair (measurements PT)    Recommendations for Other Services       Precautions / Restrictions Precautions Precautions: Fall Precaution Comments: residual R weakness Restrictions Weight Bearing Restrictions: No    Mobility  Bed Mobility               General bed mobility comments: OOB in recliner    Transfers Overall transfer level: Needs assistance Equipment used: Rolling walker (2 wheeled) Transfers: Sit to/from Stand Sit to Stand: Mod assist;+2 safety/equipment         General transfer comment: able to power up to full standing with ModAx1, second person min guard level for safety today, cues for rocking and nose over toes mechanics as well as hand  placement  Ambulation/Gait Ambulation/Gait assistance: Min assist;+2 safety/equipment Gait Distance (Feet): 14 Feet (68fx2) Assistive device: Rolling walker (2 wheeled) Gait Pattern/deviations: Step-to pattern;Decreased stride length;Shuffle;Trunk flexed;Wide base of support Gait velocity: decreased   General Gait Details: slow and steady with RW, tends to push it too far ahead of her and does need MinA to control device as well as cues for safe proximity; easily fatigued, but tolerated gait progression well on RA   Stairs             Wheelchair Mobility    Modified Rankin (Stroke Patients Only)       Balance Overall balance assessment: Needs assistance Sitting-balance support: Bilateral upper extremity supported;Feet supported Sitting balance-Leahy Scale: Fair     Standing balance support: Bilateral upper extremity supported Standing balance-Leahy Scale: Poor Standing balance comment: reliant on BUE support and external assist                            Cognition Arousal/Alertness: Awake/alert Behavior During Therapy: Flat affect Overall Cognitive Status: History of cognitive impairments - at baseline Area of Impairment: Memory;Following commands;Safety/judgement;Problem solving                     Memory: Decreased short-term memory Following Commands: Follows one step commands with increased time Safety/Judgement: Decreased awareness of safety;Decreased awareness of deficits   Problem Solving: Slow processing;Difficulty sequencing;Requires verbal cues;Requires tactile cues General Comments: does need repeated step by step cues as well as hand over hand guidance for placement with transfers; definite  STM deficits but she is aware of this and states "I know I saw you sunday but I don't remember you"      Exercises      General Comments        Pertinent Vitals/Pain Pain Assessment: No/denies pain    Home Living                       Prior Function            PT Goals (current goals can now be found in the care plan section) Acute Rehab PT Goals Patient Stated Goal: to get stronger again so I can walk by myself again PT Goal Formulation: With patient/family Time For Goal Achievement: 06/07/21 Potential to Achieve Goals: Good Progress towards PT goals: Progressing toward goals    Frequency    Min 3X/week      PT Plan Current plan remains appropriate    Co-evaluation              AM-PAC PT "6 Clicks" Mobility   Outcome Measure  Help needed turning from your back to your side while in a flat bed without using bedrails?: A Lot Help needed moving from lying on your back to sitting on the side of a flat bed without using bedrails?: Total Help needed moving to and from a bed to a chair (including a wheelchair)?: A Lot Help needed standing up from a chair using your arms (e.g., wheelchair or bedside chair)?: A Lot Help needed to walk in hospital room?: A Lot Help needed climbing 3-5 steps with a railing? : Total 6 Click Score: 10    End of Session   Activity Tolerance: Patient tolerated treatment well Patient left: in chair;with call bell/phone within reach Nurse Communication: Mobility status PT Visit Diagnosis: Unsteadiness on feet (R26.81);Other abnormalities of gait and mobility (R26.89);Muscle weakness (generalized) (M62.81)     Time: 7209-4709 PT Time Calculation (min) (ACUTE ONLY): 17 min  Charges:  $Gait Training: 8-22 mins                    Windell Norfolk, DPT, PN1   Supplemental Physical Therapist [place]    Pager [phone] Acute Rehab Office [phone]

## 2021-05-30 ENCOUNTER — Other Ambulatory Visit: Payer: Self-pay | Admitting: *Deleted

## 2021-05-30 ENCOUNTER — Other Ambulatory Visit: Payer: Self-pay

## 2021-05-30 DIAGNOSIS — Z7401 Bed confinement status: Secondary | ICD-10-CM | POA: Diagnosis not present

## 2021-05-30 DIAGNOSIS — I10 Essential (primary) hypertension: Secondary | ICD-10-CM | POA: Diagnosis not present

## 2021-05-30 DIAGNOSIS — R2681 Unsteadiness on feet: Secondary | ICD-10-CM | POA: Diagnosis not present

## 2021-05-30 DIAGNOSIS — N183 Chronic kidney disease, stage 3 unspecified: Secondary | ICD-10-CM | POA: Diagnosis not present

## 2021-05-30 DIAGNOSIS — F418 Other specified anxiety disorders: Secondary | ICD-10-CM | POA: Diagnosis not present

## 2021-05-30 DIAGNOSIS — R6520 Severe sepsis without septic shock: Secondary | ICD-10-CM | POA: Diagnosis not present

## 2021-05-30 DIAGNOSIS — E669 Obesity, unspecified: Secondary | ICD-10-CM | POA: Diagnosis not present

## 2021-05-30 DIAGNOSIS — M6281 Muscle weakness (generalized): Secondary | ICD-10-CM | POA: Diagnosis not present

## 2021-05-30 DIAGNOSIS — I6789 Other cerebrovascular disease: Secondary | ICD-10-CM | POA: Diagnosis not present

## 2021-05-30 DIAGNOSIS — U071 COVID-19: Secondary | ICD-10-CM | POA: Diagnosis not present

## 2021-05-30 DIAGNOSIS — A419 Sepsis, unspecified organism: Secondary | ICD-10-CM | POA: Diagnosis not present

## 2021-05-30 DIAGNOSIS — I779 Disorder of arteries and arterioles, unspecified: Secondary | ICD-10-CM | POA: Diagnosis not present

## 2021-05-30 DIAGNOSIS — R2990 Unspecified symptoms and signs involving the nervous system: Secondary | ICD-10-CM | POA: Diagnosis not present

## 2021-05-30 DIAGNOSIS — F5102 Adjustment insomnia: Secondary | ICD-10-CM | POA: Diagnosis not present

## 2021-05-30 DIAGNOSIS — D649 Anemia, unspecified: Secondary | ICD-10-CM | POA: Diagnosis not present

## 2021-05-30 DIAGNOSIS — I618 Other nontraumatic intracerebral hemorrhage: Secondary | ICD-10-CM | POA: Diagnosis not present

## 2021-05-30 DIAGNOSIS — R41841 Cognitive communication deficit: Secondary | ICD-10-CM | POA: Diagnosis not present

## 2021-05-30 DIAGNOSIS — A4189 Other specified sepsis: Secondary | ICD-10-CM | POA: Diagnosis not present

## 2021-05-30 DIAGNOSIS — Z1331 Encounter for screening for depression: Secondary | ICD-10-CM | POA: Diagnosis not present

## 2021-05-30 DIAGNOSIS — J1282 Pneumonia due to coronavirus disease 2019: Secondary | ICD-10-CM | POA: Diagnosis not present

## 2021-05-30 DIAGNOSIS — R531 Weakness: Secondary | ICD-10-CM | POA: Diagnosis not present

## 2021-05-30 DIAGNOSIS — E782 Mixed hyperlipidemia: Secondary | ICD-10-CM | POA: Diagnosis not present

## 2021-05-30 DIAGNOSIS — I679 Cerebrovascular disease, unspecified: Secondary | ICD-10-CM | POA: Diagnosis not present

## 2021-05-30 DIAGNOSIS — C50911 Malignant neoplasm of unspecified site of right female breast: Secondary | ICD-10-CM | POA: Diagnosis not present

## 2021-05-30 DIAGNOSIS — C50919 Malignant neoplasm of unspecified site of unspecified female breast: Secondary | ICD-10-CM | POA: Diagnosis not present

## 2021-05-30 DIAGNOSIS — I69351 Hemiplegia and hemiparesis following cerebral infarction affecting right dominant side: Secondary | ICD-10-CM | POA: Diagnosis not present

## 2021-05-30 DIAGNOSIS — M6259 Muscle wasting and atrophy, not elsewhere classified, multiple sites: Secondary | ICD-10-CM | POA: Diagnosis not present

## 2021-05-30 DIAGNOSIS — I69359 Hemiplegia and hemiparesis following cerebral infarction affecting unspecified side: Secondary | ICD-10-CM | POA: Diagnosis not present

## 2021-05-30 DIAGNOSIS — R2689 Other abnormalities of gait and mobility: Secondary | ICD-10-CM | POA: Diagnosis not present

## 2021-05-30 DIAGNOSIS — F329 Major depressive disorder, single episode, unspecified: Secondary | ICD-10-CM | POA: Diagnosis not present

## 2021-05-30 DIAGNOSIS — F4321 Adjustment disorder with depressed mood: Secondary | ICD-10-CM | POA: Diagnosis not present

## 2021-05-30 DIAGNOSIS — R1312 Dysphagia, oropharyngeal phase: Secondary | ICD-10-CM | POA: Diagnosis not present

## 2021-05-30 NOTE — Plan of Care (Signed)
  Problem: Education: Goal: Knowledge of General Education information will improve Description: Including pain rating scale, medication(s)/side effects and non-pharmacologic comfort measures Outcome: Progressing   Problem: Health Behavior/Discharge Planning: Goal: Ability to manage health-related needs will improve Outcome: Progressing   Problem: Clinical Measurements: Goal: Respiratory complications will improve Outcome: Progressing   Problem: Clinical Measurements: Goal: Cardiovascular complication will be avoided Outcome: Progressing   Problem: Safety: Goal: Ability to remain free from injury will improve Outcome: Progressing   Problem: Skin Integrity: Goal: Risk for impaired skin integrity will decrease Outcome: Progressing   Problem: Pain Managment: Goal: General experience of comfort will improve Outcome: Progressing

## 2021-05-30 NOTE — Patient Outreach (Signed)
Member screened for potential [facility] Care Management needs. Made aware by [facility] Liaison that member recently admitted to [facility]. Member was active with [facility] prior to admission.  Communication sent to [place] to make aware that member is now at [facility]. Writer made aware that [place] will follow up with [facility] regarding palliative services for member.    Marthenia Rolling, MSN, RN,BSN [place] Acute Care Coordinator [phone] [facility]) [phone]  (Toll free office)

## 2021-05-30 NOTE — Patient Outreach (Signed)
[facility]) Care Management  05/30/2021  [name] [name] 10-07-1946 [id number]  [place] [ACO] Patient: Medicare CMS DCE  Primary Care Provider:  [name], DO   Requested that patient to be followed by [facility] RN with traditional Medicare for any known or needs for transitional care needs for returning to post facility care or complex disease management.  For questions or referrals, please contact:   Natividad Brood, RN BSN [facility] Liaison  [phone] business mobile phone Toll free office [phone]  Fax number: [phone] [email or web address]

## 2021-05-30 NOTE — Progress Notes (Addendum)
0245H, patient left the facility via stretcher with the transporter going to [facility] of choice. Not in distress. Belongings and discharge instructions with the patient.

## 2021-05-31 DIAGNOSIS — I69359 Hemiplegia and hemiparesis following cerebral infarction affecting unspecified side: Secondary | ICD-10-CM | POA: Diagnosis not present

## 2021-05-31 DIAGNOSIS — E782 Mixed hyperlipidemia: Secondary | ICD-10-CM | POA: Diagnosis not present

## 2021-05-31 DIAGNOSIS — N183 Chronic kidney disease, stage 3 unspecified: Secondary | ICD-10-CM | POA: Diagnosis not present

## 2021-05-31 DIAGNOSIS — F329 Major depressive disorder, single episode, unspecified: Secondary | ICD-10-CM | POA: Diagnosis not present

## 2021-05-31 DIAGNOSIS — R2681 Unsteadiness on feet: Secondary | ICD-10-CM | POA: Diagnosis not present

## 2021-05-31 DIAGNOSIS — C50911 Malignant neoplasm of unspecified site of right female breast: Secondary | ICD-10-CM | POA: Diagnosis not present

## 2021-05-31 DIAGNOSIS — I10 Essential (primary) hypertension: Secondary | ICD-10-CM | POA: Diagnosis not present

## 2021-05-31 DIAGNOSIS — U071 COVID-19: Secondary | ICD-10-CM | POA: Diagnosis not present

## 2021-05-31 DIAGNOSIS — M6281 Muscle weakness (generalized): Secondary | ICD-10-CM | POA: Diagnosis not present

## 2021-05-31 DIAGNOSIS — E669 Obesity, unspecified: Secondary | ICD-10-CM | POA: Diagnosis not present

## 2021-06-01 DIAGNOSIS — I69351 Hemiplegia and hemiparesis following cerebral infarction affecting right dominant side: Secondary | ICD-10-CM | POA: Diagnosis not present

## 2021-06-01 DIAGNOSIS — U071 COVID-19: Secondary | ICD-10-CM | POA: Diagnosis not present

## 2021-06-01 DIAGNOSIS — I679 Cerebrovascular disease, unspecified: Secondary | ICD-10-CM | POA: Diagnosis not present

## 2021-06-01 DIAGNOSIS — I618 Other nontraumatic intracerebral hemorrhage: Secondary | ICD-10-CM | POA: Diagnosis not present

## 2021-06-01 DIAGNOSIS — I779 Disorder of arteries and arterioles, unspecified: Secondary | ICD-10-CM | POA: Diagnosis not present

## 2021-06-01 DIAGNOSIS — I10 Essential (primary) hypertension: Secondary | ICD-10-CM | POA: Diagnosis not present

## 2021-06-01 DIAGNOSIS — C50919 Malignant neoplasm of unspecified site of unspecified female breast: Secondary | ICD-10-CM | POA: Diagnosis not present

## 2021-06-01 DIAGNOSIS — N183 Chronic kidney disease, stage 3 unspecified: Secondary | ICD-10-CM | POA: Diagnosis not present

## 2021-06-01 DIAGNOSIS — D649 Anemia, unspecified: Secondary | ICD-10-CM | POA: Diagnosis not present

## 2021-06-01 DIAGNOSIS — R6520 Severe sepsis without septic shock: Secondary | ICD-10-CM | POA: Diagnosis not present

## 2021-06-01 DIAGNOSIS — A4189 Other specified sepsis: Secondary | ICD-10-CM | POA: Diagnosis not present

## 2021-06-01 DIAGNOSIS — J1282 Pneumonia due to coronavirus disease 2019: Secondary | ICD-10-CM | POA: Diagnosis not present

## 2021-06-04 DIAGNOSIS — I10 Essential (primary) hypertension: Secondary | ICD-10-CM | POA: Diagnosis not present

## 2021-06-04 DIAGNOSIS — C50911 Malignant neoplasm of unspecified site of right female breast: Secondary | ICD-10-CM | POA: Diagnosis not present

## 2021-06-04 DIAGNOSIS — M6281 Muscle weakness (generalized): Secondary | ICD-10-CM | POA: Diagnosis not present

## 2021-06-04 DIAGNOSIS — R2681 Unsteadiness on feet: Secondary | ICD-10-CM | POA: Diagnosis not present

## 2021-06-04 DIAGNOSIS — U071 COVID-19: Secondary | ICD-10-CM | POA: Diagnosis not present

## 2021-06-04 DIAGNOSIS — N183 Chronic kidney disease, stage 3 unspecified: Secondary | ICD-10-CM | POA: Diagnosis not present

## 2021-06-04 DIAGNOSIS — F329 Major depressive disorder, single episode, unspecified: Secondary | ICD-10-CM | POA: Diagnosis not present

## 2021-06-04 DIAGNOSIS — I69359 Hemiplegia and hemiparesis following cerebral infarction affecting unspecified side: Secondary | ICD-10-CM | POA: Diagnosis not present

## 2021-06-04 DIAGNOSIS — E782 Mixed hyperlipidemia: Secondary | ICD-10-CM | POA: Diagnosis not present

## 2021-06-04 DIAGNOSIS — E669 Obesity, unspecified: Secondary | ICD-10-CM | POA: Diagnosis not present

## 2021-06-05 ENCOUNTER — Telehealth: Payer: Self-pay | Admitting: *Deleted

## 2021-06-05 ENCOUNTER — Inpatient Hospital Stay: Payer: Medicare Other

## 2021-06-05 ENCOUNTER — Inpatient Hospital Stay: Payer: Medicare Other | Admitting: Oncology

## 2021-06-05 ENCOUNTER — Other Ambulatory Visit (HOSPITAL_COMMUNITY): Payer: Self-pay

## 2021-06-05 DIAGNOSIS — I6789 Other cerebrovascular disease: Secondary | ICD-10-CM | POA: Diagnosis not present

## 2021-06-05 DIAGNOSIS — I69351 Hemiplegia and hemiparesis following cerebral infarction affecting right dominant side: Secondary | ICD-10-CM | POA: Diagnosis not present

## 2021-06-05 DIAGNOSIS — Z1331 Encounter for screening for depression: Secondary | ICD-10-CM | POA: Diagnosis not present

## 2021-06-05 DIAGNOSIS — J1282 Pneumonia due to coronavirus disease 2019: Secondary | ICD-10-CM | POA: Diagnosis not present

## 2021-06-05 DIAGNOSIS — C50919 Malignant neoplasm of unspecified site of unspecified female breast: Secondary | ICD-10-CM | POA: Diagnosis not present

## 2021-06-05 DIAGNOSIS — U071 COVID-19: Secondary | ICD-10-CM | POA: Diagnosis not present

## 2021-06-05 MED ORDER — CAPECITABINE 500 MG PO TABS
ORAL_TABLET | ORAL | 1 refills | Status: DC
  Filled 2021-06-05: qty 60, 30d supply, fill #0
  Filled 2021-06-28: qty 60, 30d supply, fill #1

## 2021-06-05 NOTE — Telephone Encounter (Signed)
This RN spoke with [name] [name] - pt's daughter- per her call stating she is trying to obtain refill on the capecitabine "but they said it was discontinued with I called the [facility] outpt speciality pharmacy "  Per [name] [name] pt is currently at [facility] post admission 7/13 for possible stroke but was noted a Covid positive with covid stress induced symptom of remote stoke.  Pt was discharged 7/22 to SNF.  Presently per [name] [name] pt is able now to ambulate short distances ( with walker as before admission) - denies any diarrhea- or GI upset. Intake is good and no complaints with discomfort in palms of hands or bottom of feet.  Note pt was due to come in today- being rescheduled due to Covid issue as well as current SNF residency.  Appointment is in the process of being rescheduled to August.  Per MD review - refill is appropriate.  This RN contacted the speciality dept at the [facility] and gave refills.  Informed daughter of above - including in stock and should be available for pick up in about an hour.

## 2021-06-06 DIAGNOSIS — I10 Essential (primary) hypertension: Secondary | ICD-10-CM | POA: Diagnosis not present

## 2021-06-07 DIAGNOSIS — R2681 Unsteadiness on feet: Secondary | ICD-10-CM | POA: Diagnosis not present

## 2021-06-07 DIAGNOSIS — I10 Essential (primary) hypertension: Secondary | ICD-10-CM | POA: Diagnosis not present

## 2021-06-07 DIAGNOSIS — E669 Obesity, unspecified: Secondary | ICD-10-CM | POA: Diagnosis not present

## 2021-06-07 DIAGNOSIS — C50911 Malignant neoplasm of unspecified site of right female breast: Secondary | ICD-10-CM | POA: Diagnosis not present

## 2021-06-07 DIAGNOSIS — E782 Mixed hyperlipidemia: Secondary | ICD-10-CM | POA: Diagnosis not present

## 2021-06-07 DIAGNOSIS — F329 Major depressive disorder, single episode, unspecified: Secondary | ICD-10-CM | POA: Diagnosis not present

## 2021-06-07 DIAGNOSIS — I69359 Hemiplegia and hemiparesis following cerebral infarction affecting unspecified side: Secondary | ICD-10-CM | POA: Diagnosis not present

## 2021-06-07 DIAGNOSIS — U071 COVID-19: Secondary | ICD-10-CM | POA: Diagnosis not present

## 2021-06-07 DIAGNOSIS — M6281 Muscle weakness (generalized): Secondary | ICD-10-CM | POA: Diagnosis not present

## 2021-06-07 DIAGNOSIS — N183 Chronic kidney disease, stage 3 unspecified: Secondary | ICD-10-CM | POA: Diagnosis not present

## 2021-06-08 DIAGNOSIS — U071 COVID-19: Secondary | ICD-10-CM | POA: Diagnosis not present

## 2021-06-08 DIAGNOSIS — C50911 Malignant neoplasm of unspecified site of right female breast: Secondary | ICD-10-CM | POA: Diagnosis not present

## 2021-06-08 DIAGNOSIS — R2990 Unspecified symptoms and signs involving the nervous system: Secondary | ICD-10-CM | POA: Diagnosis not present

## 2021-06-11 DIAGNOSIS — F5102 Adjustment insomnia: Secondary | ICD-10-CM | POA: Diagnosis not present

## 2021-06-11 DIAGNOSIS — F4321 Adjustment disorder with depressed mood: Secondary | ICD-10-CM | POA: Diagnosis not present

## 2021-06-11 DIAGNOSIS — F418 Other specified anxiety disorders: Secondary | ICD-10-CM | POA: Diagnosis not present

## 2021-06-13 DIAGNOSIS — I618 Other nontraumatic intracerebral hemorrhage: Secondary | ICD-10-CM | POA: Diagnosis not present

## 2021-06-13 DIAGNOSIS — A419 Sepsis, unspecified organism: Secondary | ICD-10-CM | POA: Diagnosis not present

## 2021-06-13 DIAGNOSIS — U071 COVID-19: Secondary | ICD-10-CM | POA: Diagnosis not present

## 2021-06-13 DIAGNOSIS — I69351 Hemiplegia and hemiparesis following cerebral infarction affecting right dominant side: Secondary | ICD-10-CM | POA: Diagnosis not present

## 2021-06-15 DIAGNOSIS — U071 COVID-19: Secondary | ICD-10-CM | POA: Diagnosis not present

## 2021-06-15 DIAGNOSIS — J1282 Pneumonia due to coronavirus disease 2019: Secondary | ICD-10-CM | POA: Diagnosis not present

## 2021-06-15 DIAGNOSIS — I679 Cerebrovascular disease, unspecified: Secondary | ICD-10-CM | POA: Diagnosis not present

## 2021-06-15 DIAGNOSIS — I69351 Hemiplegia and hemiparesis following cerebral infarction affecting right dominant side: Secondary | ICD-10-CM | POA: Diagnosis not present

## 2021-06-18 DIAGNOSIS — C50911 Malignant neoplasm of unspecified site of right female breast: Secondary | ICD-10-CM | POA: Diagnosis not present

## 2021-06-18 DIAGNOSIS — M6281 Muscle weakness (generalized): Secondary | ICD-10-CM | POA: Diagnosis not present

## 2021-06-18 DIAGNOSIS — F329 Major depressive disorder, single episode, unspecified: Secondary | ICD-10-CM | POA: Diagnosis not present

## 2021-06-18 DIAGNOSIS — R2681 Unsteadiness on feet: Secondary | ICD-10-CM | POA: Diagnosis not present

## 2021-06-18 DIAGNOSIS — N183 Chronic kidney disease, stage 3 unspecified: Secondary | ICD-10-CM | POA: Diagnosis not present

## 2021-06-18 DIAGNOSIS — I69359 Hemiplegia and hemiparesis following cerebral infarction affecting unspecified side: Secondary | ICD-10-CM | POA: Diagnosis not present

## 2021-06-18 DIAGNOSIS — E782 Mixed hyperlipidemia: Secondary | ICD-10-CM | POA: Diagnosis not present

## 2021-06-18 DIAGNOSIS — E669 Obesity, unspecified: Secondary | ICD-10-CM | POA: Diagnosis not present

## 2021-06-18 DIAGNOSIS — I10 Essential (primary) hypertension: Secondary | ICD-10-CM | POA: Diagnosis not present

## 2021-06-20 ENCOUNTER — Other Ambulatory Visit: Payer: Self-pay | Admitting: *Deleted

## 2021-06-20 DIAGNOSIS — I69359 Hemiplegia and hemiparesis following cerebral infarction affecting unspecified side: Secondary | ICD-10-CM | POA: Diagnosis not present

## 2021-06-20 DIAGNOSIS — C50911 Malignant neoplasm of unspecified site of right female breast: Secondary | ICD-10-CM | POA: Diagnosis not present

## 2021-06-20 DIAGNOSIS — E785 Hyperlipidemia, unspecified: Secondary | ICD-10-CM | POA: Diagnosis not present

## 2021-06-20 DIAGNOSIS — N183 Chronic kidney disease, stage 3 unspecified: Secondary | ICD-10-CM | POA: Diagnosis not present

## 2021-06-20 DIAGNOSIS — I10 Essential (primary) hypertension: Secondary | ICD-10-CM | POA: Diagnosis not present

## 2021-06-20 NOTE — Patient Outreach (Signed)
[place] Coordinator follow up. Verified in [facility] ( Patient Pear[place]) member transitioned from [facility] on 06/18/21.  Telephone call made to Ms. Iyengar [phone] to discuss [facility] Care Management services. Patient identifiers confirmed.  Ms. Stives pleasantly declined.   Communication sent to [place] to alert member is home from SNF.   No further needs assessed.   Marthenia Rolling, MSN, RN,BSN [place] Acute Care Coordinator [phone] [facility]) [phone]  (Toll free office)

## 2021-06-22 ENCOUNTER — Telehealth: Payer: Self-pay

## 2021-06-22 NOTE — Telephone Encounter (Signed)
06/22/21 @ 9:35 AM: Palliaitve care SW outreached/returned patient daughter, [name] phone call.   Daughter shared that patient discharged from [facility] on 06/18/21 and has not fully bounced back to her baseline. Daughter voiced concern of patients decline and shares that patient is verbalizing more pain to her R side - which is where the breast cancer is located. Daughter is concerned that cancer has spread and has place a phone call to oncology - Dr. Jana Hakim - to schedule a follow up visit and request a CT scan.   Daughter shares that they have placed [place] therapy on hold for 2 weeks due to daughter feeling as though it was too much for patient. Patients appetite is poor, however seems to be increasing slightly. No abnormal sleeping patterns.   We discussed Hospice (hospice home vs hospice in a SNF), daughter is not interested in hospice coming into the home. In home visit offered to assess patient, daughter declined at this time as she wants to wait to schedule a visit until she hears from Dr. [name] office with an appointment. SW will follow up next week.

## 2021-06-25 ENCOUNTER — Telehealth: Payer: Self-pay | Admitting: *Deleted

## 2021-06-25 ENCOUNTER — Other Ambulatory Visit: Payer: Self-pay | Admitting: Oncology

## 2021-06-25 DIAGNOSIS — C50811 Malignant neoplasm of overlapping sites of right female breast: Secondary | ICD-10-CM

## 2021-06-25 DIAGNOSIS — R918 Other nonspecific abnormal finding of lung field: Secondary | ICD-10-CM

## 2021-06-25 DIAGNOSIS — M79604 Pain in right leg: Secondary | ICD-10-CM

## 2021-06-25 DIAGNOSIS — C50911 Malignant neoplasm of unspecified site of right female breast: Secondary | ICD-10-CM

## 2021-06-25 NOTE — Telephone Encounter (Signed)
Per phone discussion with the patient's daughter - [name]- noted ongoing right leg pain affecting ambulation.  Per review with MD- order obtained for plain films of hips and right femur.  Informed [name] per above.

## 2021-06-26 ENCOUNTER — Other Ambulatory Visit (HOSPITAL_COMMUNITY): Payer: Self-pay

## 2021-06-27 ENCOUNTER — Telehealth: Payer: Self-pay

## 2021-06-27 NOTE — Telephone Encounter (Signed)
06/27/21 @ 958 AM: Palliative care SW outreached patients daughter to f/u from previous phone call.  Call unsuccessful. SW LVM. Awaiting return call.

## 2021-06-28 ENCOUNTER — Other Ambulatory Visit (HOSPITAL_COMMUNITY): Payer: Self-pay

## 2021-06-28 ENCOUNTER — Ambulatory Visit (HOSPITAL_COMMUNITY)
Admission: RE | Admit: 2021-06-28 | Discharge: 2021-06-28 | Disposition: A | Discharge status: Home / Self Care | Payer: Medicare Other | Source: Ambulatory Visit | Attending: Oncology | Admitting: Oncology

## 2021-06-28 ENCOUNTER — Other Ambulatory Visit: Payer: Self-pay | Admitting: Oncology

## 2021-06-28 ENCOUNTER — Other Ambulatory Visit: Payer: Self-pay

## 2021-06-28 DIAGNOSIS — M79604 Pain in right leg: Secondary | ICD-10-CM

## 2021-06-28 DIAGNOSIS — M160 Bilateral primary osteoarthritis of hip: Secondary | ICD-10-CM | POA: Diagnosis not present

## 2021-06-28 DIAGNOSIS — Z170 Estrogen receptor positive status [ER+]: Secondary | ICD-10-CM | POA: Diagnosis not present

## 2021-06-28 DIAGNOSIS — C50911 Malignant neoplasm of unspecified site of right female breast: Secondary | ICD-10-CM

## 2021-06-28 DIAGNOSIS — C50811 Malignant neoplasm of overlapping sites of right female breast: Secondary | ICD-10-CM | POA: Insufficient documentation

## 2021-06-28 DIAGNOSIS — M79651 Pain in right thigh: Secondary | ICD-10-CM | POA: Diagnosis not present

## 2021-06-28 DIAGNOSIS — M25551 Pain in right hip: Secondary | ICD-10-CM | POA: Diagnosis not present

## 2021-06-28 DIAGNOSIS — Z853 Personal history of malignant neoplasm of breast: Secondary | ICD-10-CM | POA: Diagnosis not present

## 2021-07-02 ENCOUNTER — Other Ambulatory Visit (HOSPITAL_COMMUNITY): Payer: Self-pay

## 2021-07-04 ENCOUNTER — Telehealth: Payer: Self-pay | Admitting: *Deleted

## 2021-07-04 ENCOUNTER — Telehealth: Payer: Self-pay

## 2021-07-04 NOTE — Telephone Encounter (Signed)
This RN returned call to pt 's daughter - [name] - per need of appt.  Per MD ok to double book tomorrow since he will be out of the office next week.  This RN left VM on [name]'s identified VM with above information and requested a return call for her to state when she can bring her mother in.

## 2021-07-04 NOTE — Telephone Encounter (Signed)
07/04/21 '@2PM'$ : Palliative care SW outreached patients daughter, [name] [name], to follow up on status of scheduled xrays and appointments and possible PC home visit to discuss goals of care in more detail.  Call unsuccessful. SW LVM.   '@2'$ :40 PM: Daughter outreached PC SW and left VM stating that patient  had Xray done last week and X rays confirmed that it is arthritis and not the cancer spreading to her legs,causing the pain. Daughter still waiting to confirm oncology appointment to discuss Xray findings and any further course of actions or treatment plan for patient cancer.   Daughter shared she would like to outreach SW after oncology appointment and schedule home visit in the next 2 weeks. Daughter request is reasonable. SW will await daughter outreach, otherwise SW will outreach daughter again in 2 weeks.

## 2021-07-05 ENCOUNTER — Other Ambulatory Visit: Payer: Self-pay | Admitting: Oncology

## 2021-07-05 ENCOUNTER — Inpatient Hospital Stay: Payer: Medicare Other | Admitting: Adult Health

## 2021-07-06 ENCOUNTER — Telehealth: Payer: Self-pay | Admitting: Adult Health

## 2021-07-06 NOTE — Telephone Encounter (Signed)
Scheduled appt per 8/25 sch msg. [name] is aware.

## 2021-07-23 ENCOUNTER — Ambulatory Visit: Payer: Medicare Other | Admitting: Podiatry

## 2021-07-24 ENCOUNTER — Other Ambulatory Visit (HOSPITAL_COMMUNITY): Payer: Self-pay

## 2021-07-25 ENCOUNTER — Encounter: Payer: Self-pay | Admitting: *Deleted

## 2021-07-26 ENCOUNTER — Other Ambulatory Visit: Payer: Self-pay | Admitting: Oncology

## 2021-07-26 ENCOUNTER — Other Ambulatory Visit: Payer: Self-pay

## 2021-07-26 ENCOUNTER — Inpatient Hospital Stay (HOSPITAL_BASED_OUTPATIENT_CLINIC_OR_DEPARTMENT_OTHER): Payer: Medicare Other | Admitting: Adult Health

## 2021-07-26 ENCOUNTER — Telehealth: Payer: Self-pay

## 2021-07-26 ENCOUNTER — Other Ambulatory Visit (HOSPITAL_COMMUNITY): Payer: Self-pay

## 2021-07-26 ENCOUNTER — Encounter: Payer: Self-pay | Admitting: Adult Health

## 2021-07-26 ENCOUNTER — Inpatient Hospital Stay: Payer: Medicare Other | Attending: Adult Health

## 2021-07-26 VITALS — BP 123/64 | HR 86 | Temp 97.7°F | Resp 18

## 2021-07-26 DIAGNOSIS — C50811 Malignant neoplasm of overlapping sites of right female breast: Secondary | ICD-10-CM | POA: Diagnosis not present

## 2021-07-26 DIAGNOSIS — I69359 Hemiplegia and hemiparesis following cerebral infarction affecting unspecified side: Secondary | ICD-10-CM

## 2021-07-26 DIAGNOSIS — Z923 Personal history of irradiation: Secondary | ICD-10-CM | POA: Diagnosis not present

## 2021-07-26 DIAGNOSIS — C50911 Malignant neoplasm of unspecified site of right female breast: Secondary | ICD-10-CM

## 2021-07-26 DIAGNOSIS — Z170 Estrogen receptor positive status [ER+]: Secondary | ICD-10-CM

## 2021-07-26 DIAGNOSIS — Z9221 Personal history of antineoplastic chemotherapy: Secondary | ICD-10-CM | POA: Insufficient documentation

## 2021-07-26 DIAGNOSIS — I63132 Cerebral infarction due to embolism of left carotid artery: Secondary | ICD-10-CM

## 2021-07-26 LAB — CBC WITH DIFFERENTIAL/PLATELET
Abs Immature Granulocytes: 0.03 10*3/uL (ref 0.00–0.07)
Basophils Absolute: 0 10*3/uL (ref 0.0–0.1)
Basophils Relative: 1 %
Eosinophils Absolute: 0.2 10*3/uL (ref 0.0–0.5)
Eosinophils Relative: 3 %
HCT: 35.2 % — ABNORMAL LOW (ref 36.0–46.0)
Hemoglobin: 11 g/dL — ABNORMAL LOW (ref 12.0–15.0)
Immature Granulocytes: 1 %
Lymphocytes Relative: 25 %
Lymphs Abs: 1.6 10*3/uL (ref 0.7–4.0)
MCH: 25.5 pg — ABNORMAL LOW (ref 26.0–34.0)
MCHC: 31.3 g/dL (ref 30.0–36.0)
MCV: 81.7 fL (ref 80.0–100.0)
Monocytes Absolute: 1 10*3/uL (ref 0.1–1.0)
Monocytes Relative: 15 %
Neutro Abs: 3.7 10*3/uL (ref 1.7–7.7)
Neutrophils Relative %: 55 %
Platelets: 208 10*3/uL (ref 150–400)
RBC: 4.31 MIL/uL (ref 3.87–5.11)
RDW: 19.9 % — ABNORMAL HIGH (ref 11.5–15.5)
WBC: 6.5 10*3/uL (ref 4.0–10.5)
nRBC: 0 % (ref 0.0–0.2)

## 2021-07-26 LAB — COMPREHENSIVE METABOLIC PANEL
ALT: 11 U/L (ref 0–44)
AST: 17 U/L (ref 15–41)
Albumin: 3.7 g/dL (ref 3.5–5.0)
Alkaline Phosphatase: 138 U/L — ABNORMAL HIGH (ref 38–126)
Anion gap: 9 (ref 5–15)
BUN: 17 mg/dL (ref 8–23)
CO2: 26 mmol/L (ref 22–32)
Calcium: 9.4 mg/dL (ref 8.9–10.3)
Chloride: 108 mmol/L (ref 98–111)
Creatinine, Ser: 1.06 mg/dL — ABNORMAL HIGH (ref 0.44–1.00)
GFR, Estimated: 55 mL/min — ABNORMAL LOW (ref >60)
Glucose, Bld: 107 mg/dL — ABNORMAL HIGH (ref 70–99)
Potassium: 3.7 mmol/L (ref 3.5–5.1)
Sodium: 143 mmol/L (ref 135–145)
Total Bilirubin: 0.4 mg/dL (ref 0.3–1.2)
Total Protein: 7.7 g/dL (ref 6.5–8.1)

## 2021-07-26 NOTE — Progress Notes (Signed)
Referral entered for home health regarding (R) BRCA, Wylene Men contacted with [facility] who states she will return my call.

## 2021-07-26 NOTE — Progress Notes (Addendum)
[place]  [phone] [phone]    ID: [name] [name] DOB: [birth year]  MR#: [id number]  VXB#:[id number]  Patient Care Team: [name], DO as PCP - General (Family Medicine) Waynetta Sandy, MD as Consulting Physician (Vascular Surgery) Patsey Berthold, NP as Nurse Practitioner (Cardiology) [name], MD as Consulting Physician (Physical Medicine and Rehabilitation) Edgardo Roys, PsyD as Consulting Physician (Psychology) Magrinat, [name], MD as Consulting Physician (Oncology) [name], MD as Consulting Physician (General Surgery) Eppie Gibson, MD as Attending Physician (Radiation Oncology) OTHER MD:    CHIEF COMPLAINT: Inflammatory breast cancer  CURRENT TREATMENT: capecitabine   INTERVAL HISTORY: [name] [name] is here today for follow up accompanied by her daughter [name].  [name] has been dressing her right chest wall wound.  She notes that the wound is worsening.  [name] [name] has continued to take Capecitabine 1059m daily with good tolerance.  CRichelldoes have some occasional shortness of breath and also some itching at the skin sites of concern.  She is mildly fatigued also.    REVIEW OF SYSTEMS: Review of Systems  Constitutional:  Positive for fatigue. Negative for appetite change, chills, fever and unexpected weight change.  HENT:   Negative for hearing loss, lump/mass and trouble swallowing.   Eyes:  Negative for eye problems and icterus.  Respiratory:  Negative for chest tightness, cough and shortness of breath.   Cardiovascular:  Negative for chest pain, leg swelling and palpitations.  Gastrointestinal:  Negative for abdominal distention, abdominal pain, constipation, diarrhea, nausea and vomiting.  Endocrine: Negative for hot flashes.  Genitourinary:  Negative for difficulty urinating.   Musculoskeletal:  Negative for arthralgias.  Skin:  Positive for itching and wound. Negative for rash.  Neurological:   Negative for dizziness, extremity weakness, headaches and numbness.  Hematological:  Negative for adenopathy. Does not bruise/bleed easily.  Psychiatric/Behavioral:  Negative for depression. The patient is not nervous/anxious.      COVID 19 [place]   HISTORY OF CURRENT ILLNESS: From the original intake note:  CSkyy Nilanpresented with right breast discoloration and redness. The right breast also had an area of hardness and nipple retraction.  She did not bring this to medical attention but her daughter did mention it when the patient had neurologic follow-up on 08/10/2018.  At that time the nurse practitioner, SVinnie LevelNickel, noted right nipple inversion, hard breast, and red non-ulcerated lesions on the breast.  The [facility] her [facility] ever mamm[facility]) showing: breast density category B. There was a round 2.5 cm mass at the 1 o'clock upper inner quadrant of the right breast.  By palpation this measured approximately 10 cm.  Sonographically the irregular hypoechoic mass measured 2.7 cm. There was also a hypoechoic mass in the right breast at 9 o'clock upper outer quadrant measuring 3.8 cm. There was an abnormal right axillary lymph node with cortical thickening.   Accordingly on 08/19/2018 she proceeded to biopsy of the right breast areas in question. The pathology from this procedure showed [phone]: At the 1 o'clock: invasive lobular carcinoma, grade II. Prognostic indicators significant for: estrogen receptor, 60% positive with moderate staining intensity and progesterone receptor, 0% negative. Proliferation marker Ki67 at 20%. HER2 negative with an immunohistochemistry of (1+).  At the 9 o'clock: invasive lobular carcinoma, grade II. Prognostic indicators significant for: estrogen receptor, 90% positive with strong staining  intensity  and progesterone receptor, 0% negative. Proliferation marker Ki67 at 30%. HER2 negative with an immunohistochemistry of (1+).  The patient's subsequent history is as detailed below.   PAST MEDICAL HISTORY: Past Medical History:  Diagnosis Date   Anxiety    Cancer [facility])    breast cancer- right   Carotid stenosis    Depression    situational   Dyspnea    with much ambulation   GERD (gastroesophageal reflux disease)    Hypertension    Stroke ([place]) 04/01/2017   a little weakness right side leg and hand, a little memry loss    PAST SURGICAL HISTORY: Past Surgical History:  Procedure Laterality Date   ABDOMINAL HYSTERECTOMY     ENDARTERECTOMY Left 04/08/2017   Procedure: ENDARTERECTOMY CAROTID;  Surgeon: Waynetta Sandy, MD;  Location: [place];  Service: Vascular;  Laterality: Left;   PATCH ANGIOPLASTY Left 04/08/2017   Procedure: PATCH ANGIOPLASTY Left Carotid;  Surgeon: Waynetta Sandy, MD;  Location: [place];  Service: Vascular;  Laterality: Left;   PORTACATH PLACEMENT Left 09/07/2018   Procedure: INSERTION PORT-A-CATH LEFT INTERNAL JUGULAR;  Surgeon: [name], MD;  Location: [place];  Service: General;  Laterality: Left;  total hysterectomy with bilateral salpingo-oophorectomy in her 75's no hormone replacement   FAMILY HISTORY: Family History  Problem Relation Age of Onset   Cancer Mother        ovarian  The patient's father is alive at age 25. The patient's mother died at age 2 due to ovarian cancer. The patient had 3 brothers and 3 sisters. She denies a history of breast, pancreatic, colon, or other cancers in the family.    GYNECOLOGIC HISTORY:  No LMP recorded. Patient has had a hysterectomy. Menarche: Age at first live birth: 75 years old She is GX P1.  She is status post total hysterectomy with BSO in her early 59's. She did not use HRT.    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. She is divorced.  At home is just herself, but her two grandsons regularly come to visit and stay with her. The patient's daughter, [name] works in administration for [employer]. The patient's 66 y.o. grandson is a Freight forwarder for [employer]. The patient's 76 y.o. grandson works in [employer].    ADVANCED DIRECTIVES: Not in place.  At the 08/26/2018 visit the patient was given the appropriate documents to complete and notarized at her discretion.   HEALTH MAINTENANCE: Social History   Tobacco Use   Smoking status: Never   Smokeless tobacco: Never  Vaping Use   Vaping Use: Never used  Substance Use Topics   Alcohol use: No   Drug use: No     Colonoscopy: Never  PAP: Status post hysterectomy  Bone density: Never   No Known Allergies  Current Outpatient Medications  Medication Sig Dispense Refill   amLODipine (NORVASC) 10 MG tablet TAKE 1 TABLET BY MOUTH ONCE DAILY 90 tablet 3   atorvastatin (LIPITOR) 80 MG tablet TAKE 1 TABLET BY MOUTH ONCE DAILY 90 tablet 0   capecitabine (XELODA) 500 MG tablet Take 2 tablets (1,000 mg total) by mouth daily. Take within 30 minutes after breakfast. 60 tablet 6   capecitabine (XELODA) 500 MG tablet Take 2 tablets (1056m) by mouth daily.  Take within 30 min after breakfast. 60 tablet 1   carvedilol (COREG) 3.125 MG tablet Take 1 tablet (3.125 mg total) by mouth 2 (two) times daily with a meal.  clopidogrel (PLAVIX) 75 MG tablet Take 1 tablet (75 mg total) by mouth daily. 90 tablet 3   latanoprost (XALATAN) 0.005 % ophthalmic solution PLACE 1 DROP IN BOTH EYES EVERY EVENING 7.5 mL 3   lisinopril-hydrochlorothiazide (ZESTORETIC) 20-12.5 MG tablet Take 1 tablet by mouth daily 90 tablet 3   loratadine (CLARITIN) 10 MG tablet Take 10 mg by mouth daily as needed for allergies.     No current facility-administered medications for this visit.    OBJECTIVE:   Vitals:   07/26/21 1330  BP: 123/64  Pulse: 86  Resp: 18  Temp: 97.7 F (36.5 C)  SpO2: 100%    Wt  Readings from Last 3 Encounters:  05/26/21 225 lb 12 oz (102.4 kg)  04/26/21 228 lb 3.2 oz (103.5 kg)  01/30/21 208 lb 4.8 oz (94.5 kg)   There is no height or weight on file to calculate BMI.    ECOG FS:2 - Symptomatic, <50% confined to bed  GENERAL: Patient is a well appearing female in no acute distress HEENT:  Sclerae anicteric.  Oropharynx clear and moist.  Neck is supple.  NODES:  No cervical, supraclavicular, or axillary lymphadenopathy palpated.  BREAST EXAM:  see picture below LUNGS:  Clear to auscultation bilaterally.  No wheezes or rhonchi. HEART:  Regular rate and rhythm. No murmur appreciated. ABDOMEN:  Soft, nontender.  Positive, normoactive bowel sounds. No organomegaly palpated. MSK:  No focal spinal tenderness to palpation. Full range of motion bilaterally in the upper extremities. EXTREMITIES:  No peripheral edema.   SKIN:  Clear with no obvious rashes or skin changes. No nail dyscrasia. NEURO:  Nonfocal. Well oriented.  Appropriate affect.  Right breast 07/26/2021   Right breast 04/26/2021  Right breast 05/24/2020     Right breast 04/19/2020      Right breast 02/02/2020     Right breast 08/12/2019      LAB RESULTS:  CMP     Component Value Date/Time   NA 143 07/26/2021 1256   K 3.7 07/26/2021 1256   CL 108 07/26/2021 1256   CO2 26 07/26/2021 1256   GLUCOSE 107 (H) 07/26/2021 1256   BUN 17 07/26/2021 1256   CREATININE 1.06 (H) 07/26/2021 1256   CREATININE 1.19 (H) 04/26/2021 1347   CALCIUM 9.4 07/26/2021 1256   PROT 7.7 07/26/2021 1256   ALBUMIN 3.7 07/26/2021 1256   AST 17 07/26/2021 1256   AST 18 04/26/2021 1347   ALT 11 07/26/2021 1256   ALT 19 04/26/2021 1347   ALKPHOS 138 (H) 07/26/2021 1256   BILITOT 0.4 07/26/2021 1256   BILITOT 0.3 04/26/2021 1347   GFRNONAA 55 (L) 07/26/2021 1256   GFRNONAA 48 (L) 04/26/2021 1347   GFRAA >60 05/24/2020 1522   GFRAA 58 (L) 11/03/2018 1017    No results found for: TOTALPROTELP,  ALBUMINELP, A1GS, A2GS, BETS, BETA2SER, GAMS, MSPIKE, SPEI  No results found for: KPAFRELGTCHN, LAMBDASER, KAPLAMBRATIO  Lab Results  Component Value Date   WBC 6.5 07/26/2021   NEUTROABS 3.7 07/26/2021   HGB 11.0 (L) 07/26/2021   HCT 35.2 (L) 07/26/2021   MCV 81.7 07/26/2021   PLT 208 07/26/2021    No results found for: LABCA2  No components found for: [id number]  No results for input(s): INR in the last 168 hours.  No results found for: LABCA2  No results found for: RCV893  No results found for: YBO175  No results found for: ZWC585  Lab Results  Component Value Date   CA2729 78.6 (  H) 08/18/2019    No components found for: HGQUANT  No results found for: CEA1 / No results found for: CEA1   No results found for: AFPTUMOR  No results found for: CHROMOGRNA  No results found for: HGBA, HGBA2QUANT, HGBFQUANT, HGBSQUAN (Hemoglobinopathy evaluation)   No results found for: LDH  No results found for: IRON, TIBC, IRONPCTSAT (Iron and TIBC)  No results found for: FERRITIN  Urinalysis    Component Value Date/Time   COLORURINE STRAW (A) 05/23/2021 2014   APPEARANCEUR CLEAR 05/23/2021 2014   LABSPEC 1.035 (H) 05/23/2021 2014   PHURINE 7.0 05/23/2021 2014   GLUCOSEU NEGATIVE 05/23/2021 2014   HGBUR [place] (A) 05/23/2021 2014   [place] NEGATIVE 05/23/2021 2014   [place] NEGATIVE 05/23/2021 2014   PROTEINUR NEGATIVE 05/23/2021 2014   NITRITE NEGATIVE 05/23/2021 2014   LEUKOCYTESUR TRACE (A) 05/23/2021 2014    STUDIES: DG HIPS BILAT WITH PELVIS 2V  Result Date: 06/29/2021 CLINICAL DATA:  Pain with weight-bearing.  History of breast cancer. EXAM: DG HIP (WITH OR WITHOUT PELVIS) 2V BILAT COMPARISON:  None. FINDINGS: There is no evidence of hip fracture or dislocation. Mild degenerative changes are noted within both hips. No suspicious bone lesions. IMPRESSION: No acute findings. Electronically Signed   By: Kerby Moors M.D.   On: 06/29/2021 09:51   DG FEMUR,  MIN 2 VIEWS RIGHT  Result Date: 06/29/2021 CLINICAL DATA:  Worsening bilat hip, rt leg pain to foot on pt with hx of breast ca, no known traumaongoing pain in pt with breast cancer EXAM: RIGHT FEMUR 2 VIEWS COMPARISON:  None. FINDINGS: There is no evidence of fracture or other focal bone lesions. Soft tissues are unremarkable. IMPRESSION: No acute findings of the RIGHT femur.  No aggressive osseous lesion. Electronically Signed   By: Suzy Bouchard M.D.   On: 06/29/2021 09:53     ELIGIBLE FOR AVAILABLE RESEARCH PROTOCOL: no   ASSESSMENT: 75 y.o. [place], [place] woman status post right breast biopsy of overlapping sites on 08/19/2018 for a clinical T4 N1, stage IIIB invasive lobular carcinoma, estrogen receptor positive, progesterone receptor negative, HER-2 not amplified, with an MIB-1 of 20%.  (a) CT chest/abd/pelvis 09/02/2018 and bone scan 09/10/2018 show no evidence of stage IV disease  (b) breast MRI 09/14/2018 confirms a multicentric 12.6 cm right breast mass infiltrating pectpralis but w/o associated adenopathy  (1) genetics testing to be scheduled  (2) neoadjuvant chemotherapy consisting of cyclophosphamide, methotrexate and fluorouracil every 21 days x 8, started 09/16/2018, last dose 12/18/2018  (a) CMF chemotherapy stopped after 4 cycles, with no evidence of response by MRI NOV 2019  (5) anastrozole started February 2019  (a) bone density to be obtained  (b) palbociclib added 01/19/2019 at 125 mg daily, 21/7  (c) palbociclib dose decreased to 100 mg daily 21 on 7 off, starting with second cycle, April 2020  (d) breast MRI 05/05/2019 shows slight improvement, no evidence of progression, however the tumor is not yet resectable.  Chest CT scan same day shows no evidence of metastatic disease  (e) anastrozole and palbociclib discontinued 08/12/2019 with no significant response  (6) foundation 1/PD-L1 testing:  (a) PD-L1 requested 12/18/2018--insufficient tissue  (b) foundation one  testing shows the microsatellite status to be stable, and the mutational burden to be 16/Mb  (c) there are potentially actionable mutations in AKT3, TSC1, KRAS  MAPK1 and RB1  (7) started paclitaxel 08/18/2019, given days 1 and 8 of each 21-28-day cycle  (a) changed to every 14 days beginning with  second cycle per patient preference  (b) discontinued after 10/27/2019 dose with no evidence of response (minimal evidence of progression)  (8) fulvestrant started 11/10/2019.  (a) discontinued after 02/02/2020 dose as no evidence of response  (9) definitive surgery considered, felt not to be possible given local extent of disease  (10) adjuvant radiation considered May 2021, patient opted against  (11) started exemestane mid May 2021   (a) started everolimus 04/17/2020--discontinued after repeated diarrhea episodes  (b) exemestane and everolimus discontinued 05/24/2020 with evidence of disease progression  (12) Palliative radiation to the right chest wall tumor 08/14/2020-08/18/2020  (13) Capecitabine 1029m daily beginning in 04/29/2021, discontinued 07/26/2021 with progression  (14) consider additional palliative radiation  (15) consider Doxil chemotherapy vs. Hospice referral   PLAN:  CLakeishamet with myself and Dr. MJana Hakimto discuss her situation.  Unfortunately, her cancer is getting worse despite her taking Capecitabine daily.    Dr. MJana Hakimreviewed with her that he is limitied in the additional treatment options that she can receive.  He reviewed that she could receive chemotherapy with Doxil.  He discussed the risk and benefits of the chemo including the fact that she would need a central line to receive this.    He then reviewed the possiblity of hospice and symptom management.  He explained to Cythia and JThayer Headingsthat this can be provided in the home.    Third, he discussed CRebarevisiting with radiation oncology to evaluate if she could receive any further radiation to the  area. I placed a referral to Dr. SIsidore Moosand we will see CZakeyaback in 2 weeks to further discuss her decision about IV chemotherapy.    Total encounter time: 30 minutes  LWilber Bihari NP 07/26/21 10:49 PM Medical Oncology and Hematology [facility] [address]Tel. [phone]   Fax. [phone]  ADDENDUM: Maryuri's chest wall tumor is clearly progressing despite the capecitabine. We are stopping that medication and again discussed options.  The patient and her daughter understand management of chest wall recurrence is unsatisfactory.Perhaps because of poor perfusion to the skin chemotherapy frequently is not effetive. Recently I have had a patient in similar circumstances who appears to be having a response to Doxil and we discussed that option. We went over possible side effects and complications and they understand a central line would be needed.  The patient said she had no interest in any cheotherapy treatments or in having a central line.   I suggested we could move to best supportive care under Hospice and described how that would work, emphasizing that the patient would still be home. The patient and daughter will consider this  We then considered further palliative radiation. The patient's earlier treatments were truncated so possibly there is room for further treatment to the previously irradiated area. We are placing a referral today so that possibility can be evaluated.  If it becomes clear no further radiation is advised, or after the patient receives treatment, we will return to the choice of Doxil vs. Hospice.  I personally saw this patient and performed a substantive portion of this encounter with the listed APP documented above.   GChauncey Cruel MD Medical Oncology and Hematology [facility] StreetAWalnut Creek [place] [address]Tel. [phone]   Fax. [phone]  *Total Encounter Time  as defined by the Centers for Medicare and Medicaid Services includes, in addition to the face-to-face time of a patient visit (  documented in the note above) non-face-to-face time: obtaining and reviewing outside history, ordering and reviewing medications, tests or procedures, care coordination (communications with other health care professionals or caregivers) and documentation in the medical record.

## 2021-07-26 NOTE — Progress Notes (Signed)
Rightcw [id number]

## 2021-07-26 NOTE — Telephone Encounter (Signed)
[name] [name] from [facility] called and states they will call daughter and visit this weekend to assess wound.

## 2021-07-27 ENCOUNTER — Other Ambulatory Visit (HOSPITAL_COMMUNITY): Payer: Self-pay

## 2021-07-27 ENCOUNTER — Encounter: Payer: Self-pay | Admitting: Oncology

## 2021-07-27 ENCOUNTER — Telehealth: Payer: Self-pay | Admitting: Radiation Oncology

## 2021-07-27 NOTE — Telephone Encounter (Signed)
Called patient to schedule RECON with Dr. Isidore Moos. No answer, LVM for return call.

## 2021-08-06 NOTE — Progress Notes (Signed)
Location of Breast Cancer:  Malignant neoplasm of overlapping sites of RIGHT breast, estrogen receptor positive   Histology per Pathology Report:  08/19/2018 Diagnosis 1. Breast, right, needle core biopsy, 1 o'clock, 7cmn - INVASIVE MAMMARY CARCINOMA. 2. Breast, right, needle core biopsy, 9 o'clock, 2cmn - INVASIVE MAMMARY CARCINOMA. Microscopic Comment 1. The carcinoma appears grade 2.  Receptor Status: ER(60%), PR (0%), Her2-neu (Negative), Ki-67(20%)  Past/Anticipated interventions by surgeon, if any: Patient felt not to be surgical candidate given local extent of disease  Past/Anticipated interventions by medical oncology, if any:  Under care of Dr. Sarajane Jews Magrinat --07/26/2021 (office visit with Mendel Ryder Causey-NP) neoadjuvant chemotherapy consisting of cyclophosphamide, methotrexate and fluorouracil every 21 days x 8, started 09/16/2018, last dose 12/18/2018 anastrozole started February 2019 palbociclib added 01/19/2019 at 125 mg daily, 21/7 anastrozole and palbociclib discontinued 08/12/2019 with no significant response started paclitaxel 08/18/2019 discontinued after 10/27/2019 dose with no evidence of response (minimal evidence of progression) fulvestrant started 11/10/2019. discontinued after 02/02/2020 dose as no evidence of response definitive surgery considered, felt not to be possible given local extent of disease started exemestane mid May 2021.  started everolimus 04/17/2020--discontinued after repeated diarrhea episodes.  exemestane and everolimus discontinued 05/24/2020 with evidence of disease progression Palliative radiation to the right chest wall tumor 08/14/2020-08/18/2020 Capecitabine 1000mg  daily beginning in 04/29/2021, discontinued 07/26/2021 with progression consider Doxil chemotherapy vs. Hospice referral --we will see Aquila back in 2 weeks to further discuss her decision about IV chemotherapy  Lymphedema issues, if any:  Patient and daughter report  swelling and weeping from from mass on right breast.     Pain issues, if any:  Patient reports discomfort from mass/lesion on right breast. She also reports limitation in range of motion to her right arm.   SAFETY ISSUES: Prior radiation? Yes: 08/14/2020 through 08/18/2020 Site Technique Total Dose (Gy) Dose per Fx (Gy) Completed Fx Beam [employer]ies  Breast, Right: Breast_Rt 3D 26/26 5.2 5/5 10X, 15X   Pacemaker/ICD? No Possible current pregnancy? No--hysterectomy Is the patient on methotrexate? No  Current Complaints / other details:  Daughter reports patient has on-going fatigue through out the day (has to take frequent naps). She also reports a noticeable decline in patient's appetite, but denies unintentional weight loss

## 2021-08-06 NOTE — Progress Notes (Signed)
Radiation Oncology         [phone] ________________________________ Re-Consultation by telephone.  The patient opted for telemedicine to maximize safety during the pandemic.  MyChart video was not obtainable.  Name: [name] [name] MRN: [id number]  Date: 08/07/2021  DOB: Feb 20, 1946  HQ:IONGEXB, Hinton Dyer, DO  Magrinat, [name], MD   REFERRING PHYSICIAN: Magrinat, [name], MD  DIAGNOSIS: [phone]   ICD-10-CM   1. Inflammatory breast cancer, right ([place])  C50.911     2. Malignant neoplasm of overlapping sites of right breast in female, estrogen receptor positive ([place])  C50.811    Z17.0      Progression of right breast cancer; open right breast/chest lesion  Cancer Staging Malignant neoplasm of overlapping sites of right breast in female, estrogen receptor positive ([place]) Staging form: Breast, AJCC 8th Edition - Clinical stage from 08/26/2018: Stage IIIB (cT4b, cN1, cM0, G2, ER+, PR-, HER2-) - Unsigned  CHIEF COMPLAINT: Here to discuss management of right breast cancer progression  HISTORY OF PRESENT ILLNESS: [name] [name] is a 75 y.o. female who returns today for consideration of further radiation treatment in management of her diagnosed right breast cancer which has recently progressed. The patient was last seen by me for follow-up on 09/22/2020 following radiation treatment to her right breast (completed on 08/18/20). Following radiation, the patient was started on Capecitabine on 04/29/21 which was discontinued on 07/26/21 due to evidence of disease progression (detailed below). (To review, the patient has had a right chest/breast wound at the site of disease).   During follow-up visit with Dr. Jana Hakim and Wilber Bihari NP on 07/26/21,  the patient (and her daughter) noted worsening of her right breast wound. Per image linked to the visit note, the right breast wound appeared open and bulging and noticeably worse since last evaluated. Due to evidence of disease progression, Dr.  Jana Hakim referred the patient to radiation oncology for for consideration of further radiotherapy, and discontinued Capecitabine.  Pertinent imaging since the patient was last seen includes:  --X-ray of the pelvis on 06/28/21 which demonstrated no evidence of hip fracture or dislocation. Mild degenerative changes were noted within both hips, otherwise no suspicious bone lesions were appreciated. --Femoral x-ray on 06/28/21 demonstrated no evidence of fracture or other focal bone lesions.  OF note: the patient presented to the [facility] ED on 05/23/21 with concerns of not being able to walk. Patient reported at this time progressive worsening generalized weakness (difficulty to ambulate was new). Exam and MRI performed during ED course revealed no significant neurological findings on exam other than mild right lower extremity weakness noted by ED physician.  She was evaluated by neurology Dr. Cheral Marker and CT angio head and neck was obtained which were both negative for large vessel occlusion. She was admitted for further observation. COVID test taken during admission returned positive which was noted to be the possible cause of her weakness.  Patient met criteria for sepsis given tachypnea and tachycardia from COVID-19 infection which resolved following 3 days of IV remdesivir. During hospital course, the patient's right breast wound was evident and she was instructed on proper wound care at discharge.   The patient's daughter reports that the patient is getting weaker and more debilitated.  Hospice has been discussed with medical oncology.   PREVIOUS RADIATION THERAPY:  08/14/2020 through 08/18/2020 Site Technique Total Dose (Gy) Dose per Fx (Gy) Completed Fx Beam Energies  Breast, Right: Breast_Rt 3D 26/26 5.2 5/5 10X, 15X   OTHER PAST THERAPY: --neoadjuvant chemotherapy consisting  of cyclophosphamide, methotrexate and fluorouracil :09/16/2018-12/18/2018 (no response) --anastrozole:  12/2017-08/12/2019 (no response) --paclitaxel: 08/18/2019- 10/27/2019 (no response) --fulvestrant: 11/10/2019- 02/02/2020 (no response) --started everolimus 04/17/2020--discontinued after repeated diarrhea episodes --exemestane started in May of 2021 and discontinued 05/24/2020 with evidence of disease progression --Capecitabine 1077m daily beginning in 04/29/2021, discontinued 07/26/2021 with progression  PAST MEDICAL HISTORY:  has a past medical history of Anxiety, [place], Carotid stenosis, Depression, Dyspnea, GERD (gastroesophageal reflux disease), Hypertension, and Stroke ([place] (04/01/2017).    PAST SURGICAL HISTORY: Past Surgical History:  Procedure Laterality Date   ABDOMINAL HYSTERECTOMY     ENDARTERECTOMY Left 04/08/2017   Procedure: ENDARTERECTOMY CAROTID;  Surgeon: CWaynetta Sandy MD;  [place]  Service: Vascular;  Laterality: Left;   PATCH ANGIOPLASTY Left 04/08/2017   Procedure: PATCH ANGIOPLASTY Left Carotid;  Surgeon: CWaynetta Sandy MD;  [place]  Service: Vascular;  Laterality: Left;   PORTACATH PLACEMENT Left 09/07/2018   Procedure: INSERTION PORT-A-CATH LEFT INTERNAL JUGULAR;  Surgeon: TJovita Kussmaul MD;  [place]  Service: General;  Laterality: Left;    FAMILY HISTORY: family history includes Cancer in her mother.  SOCIAL HISTORY:  reports that she has never smoked. She has never used smokeless tobacco. She reports that she does not drink alcohol and does not use drugs.  ALLERGIES: Patient has no known allergies.  MEDICATIONS:  Current Outpatient Medications  Medication Sig Dispense Refill   amLODipine (NORVASC) 10 MG tablet TAKE 1 TABLET BY MOUTH ONCE DAILY 90 tablet 3   atorvastatin (LIPITOR) 80 MG tablet TAKE 1 TABLET BY MOUTH ONCE DAILY 90 tablet 0   capecitabine (XELODA) 500 MG tablet Take 2 tablets (1,000 mg total) by mouth daily. Take within 30 minutes after breakfast. 60 tablet 6   capecitabine (XELODA)  500 MG tablet Take 2 tablets (10052m by mouth daily.  Take within 30 min after breakfast. 60 tablet 1   carvedilol (COREG) 3.125 MG tablet Take 1 tablet (3.125 mg total) by mouth 2 (two) times daily with a meal.     clopidogrel (PLAVIX) 75 MG tablet Take 1 tablet (75 mg total) by mouth daily. 90 tablet 3   latanoprost (XALATAN) 0.005 % ophthalmic solution PLACE 1 DROP IN BOTH EYES EVERY EVENING 7.5 mL 3   lisinopril-hydrochlorothiazide (ZESTORETIC) 20-12.5 MG tablet Take 1 tablet by mouth daily 90 tablet 3   loratadine (CLARITIN) 10 MG tablet Take 10 mg by mouth daily as needed for allergies.     No current facility-administered medications for this encounter.    REVIEW OF SYSTEMS: As above in HPI.   PHYSICAL EXAM:  vitals were not taken for this visit.     LABORATORY DATA:  Lab Results  Component Value Date   WBC 6.5 07/26/2021   HGB 11.0 (L) 07/26/2021   HCT 35.2 (L) 07/26/2021   MCV 81.7 07/26/2021   PLT 208 07/26/2021   CMP     Component Value Date/Time   NA 143 07/26/2021 1256   K 3.7 07/26/2021 1256   CL 108 07/26/2021 1256   CO2 26 07/26/2021 1256   GLUCOSE 107 (H) 07/26/2021 1256   BUN 17 07/26/2021 1256   CREATININE 1.06 (H) 07/26/2021 1256   CREATININE 1.19 (H) 04/26/2021 1347   CALCIUM 9.4 07/26/2021 1256   PROT 7.7 07/26/2021 1256   ALBUMIN 3.7 07/26/2021 1256   AST 17 07/26/2021 1256   AST 18 04/26/2021 1347   ALT 11 07/26/2021 1256   ALT 19 04/26/2021 1347  ALKPHOS 138 (H) 07/26/2021 1256   BILITOT 0.4 07/26/2021 1256   BILITOT 0.3 04/26/2021 1347   GFRNONAA 55 (L) 07/26/2021 1256   GFRNONAA 48 (L) 04/26/2021 1347   GFRAA >60 05/24/2020 1522   GFRAA 58 (L) 11/03/2018 1017         RADIOGRAPHY: As above    IMPRESSION/PLAN: Today, I talked to the patient and her daughter about the findings and work-up thus far.  I have reviewed the serial photographs from medical oncology which were very helpful. We discussed the patient's diagnosis of progressive  locally advanced right breast cancer and general treatment for this, highlighting the role of palliative radiotherapy in the management.  We discussed the available radiation techniques, and focused on the details of logistics and delivery.     She was not a good candidate for mastectomy a year ago when she pursued palliative radiation.  She had good palliative effect from a 1 week course of hypofractionated radiation.  Now, she has progressive disease and is still not a good candidate for mastectomy.  I do think it is reasonable that we conduct another 1 week course of hypofractionated radiation therapy.  Given the patient's weakness and overall suboptimal performance status, a 5 fraction regimen is probably the best balance for her.  We discussed the risks, benefits, and side effects of radiotherapy. Side effects may include but not necessarily be limited to: Skin irritation, fatigue, fibrosis of the breast, discomfort/pain of the breast, contraction of the breast, soft tissue injury, rib fracture.  I explained that the risk of side effects is higher since she previously was exposed to radiation in the same part of her body.  No guarantees of treatment were given. The patient and her daughter were encouraged to ask questions that I answered to the best of my ability.     I will arrange CT simulation in the near future.    This encounter was provided by telemedicine platform; patient desired telemedicine during pandemic precautions.  MyChart video was not available and therefore telephone was used. The patient has given verbal consent for this type of encounter and has been advised to only accept a meeting of this type in a secure network environment. On date of service, in total, I spent 25 minutes on this encounter.   The attendants for this meeting include Eppie Gibson  and Webb Laws During the encounter, Eppie Gibson was located at [facility] Radiation Oncology Department.   Tyeesha Riker was located at home.     __________________________________________   Eppie Gibson, MD  This document serves as a record of services personally performed by Eppie Gibson, MD. It was created on her behalf by Roney Mans, a trained medical scribe. The creation of this record is based on the scribe's personal observations and the provider's statements to them. This document has been checked and approved by the attending provider.

## 2021-08-07 ENCOUNTER — Encounter: Payer: Self-pay | Admitting: Radiation Oncology

## 2021-08-07 ENCOUNTER — Ambulatory Visit
Admission: RE | Admit: 2021-08-07 | Discharge: 2021-08-07 | Disposition: A | Discharge status: Home / Self Care | Payer: Medicare Other | Source: Ambulatory Visit | Attending: Radiation Oncology | Admitting: Radiation Oncology

## 2021-08-07 DIAGNOSIS — E785 Hyperlipidemia, unspecified: Secondary | ICD-10-CM | POA: Diagnosis not present

## 2021-08-07 DIAGNOSIS — I10 Essential (primary) hypertension: Secondary | ICD-10-CM | POA: Diagnosis not present

## 2021-08-07 DIAGNOSIS — I69359 Hemiplegia and hemiparesis following cerebral infarction affecting unspecified side: Secondary | ICD-10-CM | POA: Diagnosis not present

## 2021-08-07 DIAGNOSIS — C50911 Malignant neoplasm of unspecified site of right female breast: Secondary | ICD-10-CM | POA: Diagnosis not present

## 2021-08-07 DIAGNOSIS — C50811 Malignant neoplasm of overlapping sites of right female breast: Secondary | ICD-10-CM | POA: Diagnosis not present

## 2021-08-07 DIAGNOSIS — Z170 Estrogen receptor positive status [ER+]: Secondary | ICD-10-CM

## 2021-08-15 ENCOUNTER — Ambulatory Visit
Admission: RE | Admit: 2021-08-15 | Discharge: 2021-08-15 | Disposition: A | Discharge status: Home / Self Care | Payer: Medicare Other | Source: Ambulatory Visit | Attending: Radiation Oncology | Admitting: Radiation Oncology

## 2021-08-15 ENCOUNTER — Encounter: Payer: Self-pay | Admitting: Oncology

## 2021-08-15 ENCOUNTER — Other Ambulatory Visit (HOSPITAL_COMMUNITY): Payer: Self-pay

## 2021-08-15 DIAGNOSIS — Z510 Encounter for antineoplastic radiation therapy: Secondary | ICD-10-CM | POA: Diagnosis not present

## 2021-08-15 DIAGNOSIS — Z170 Estrogen receptor positive status [ER+]: Secondary | ICD-10-CM | POA: Diagnosis not present

## 2021-08-15 DIAGNOSIS — C50811 Malignant neoplasm of overlapping sites of right female breast: Secondary | ICD-10-CM | POA: Diagnosis not present

## 2021-08-15 MED ORDER — AMLODIPINE BESYLATE 10 MG PO TABS
ORAL_TABLET | ORAL | 2 refills | Status: DC
  Filled 2021-08-15: qty 90, 90d supply, fill #0

## 2021-08-17 ENCOUNTER — Other Ambulatory Visit (HOSPITAL_COMMUNITY): Payer: Self-pay

## 2021-08-17 DIAGNOSIS — Z510 Encounter for antineoplastic radiation therapy: Secondary | ICD-10-CM | POA: Diagnosis not present

## 2021-08-17 DIAGNOSIS — Z170 Estrogen receptor positive status [ER+]: Secondary | ICD-10-CM | POA: Diagnosis not present

## 2021-08-17 DIAGNOSIS — C50811 Malignant neoplasm of overlapping sites of right female breast: Secondary | ICD-10-CM | POA: Diagnosis not present

## 2021-08-20 ENCOUNTER — Ambulatory Visit
Admission: RE | Admit: 2021-08-20 | Discharge: 2021-08-20 | Disposition: A | Discharge status: Home / Self Care | Payer: Medicare Other | Source: Ambulatory Visit | Attending: Radiation Oncology | Admitting: Radiation Oncology

## 2021-08-20 DIAGNOSIS — Z170 Estrogen receptor positive status [ER+]: Secondary | ICD-10-CM | POA: Diagnosis not present

## 2021-08-20 DIAGNOSIS — C50811 Malignant neoplasm of overlapping sites of right female breast: Secondary | ICD-10-CM | POA: Diagnosis not present

## 2021-08-20 DIAGNOSIS — Z510 Encounter for antineoplastic radiation therapy: Secondary | ICD-10-CM | POA: Diagnosis not present

## 2021-08-20 NOTE — Progress Notes (Signed)
Neosporin and telfa pad applied to growth to left chest wall. Supplies given to family

## 2021-08-21 ENCOUNTER — Other Ambulatory Visit: Payer: Self-pay

## 2021-08-21 ENCOUNTER — Ambulatory Visit
Admission: RE | Admit: 2021-08-21 | Discharge: 2021-08-21 | Disposition: A | Discharge status: Home / Self Care | Payer: Medicare Other | Source: Ambulatory Visit | Attending: Radiation Oncology | Admitting: Radiation Oncology

## 2021-08-21 DIAGNOSIS — Z510 Encounter for antineoplastic radiation therapy: Secondary | ICD-10-CM | POA: Diagnosis not present

## 2021-08-21 DIAGNOSIS — C50811 Malignant neoplasm of overlapping sites of right female breast: Secondary | ICD-10-CM | POA: Diagnosis not present

## 2021-08-21 DIAGNOSIS — Z170 Estrogen receptor positive status [ER+]: Secondary | ICD-10-CM | POA: Diagnosis not present

## 2021-08-22 ENCOUNTER — Ambulatory Visit
Admission: RE | Admit: 2021-08-22 | Discharge: 2021-08-22 | Disposition: A | Discharge status: Home / Self Care | Payer: Medicare Other | Source: Ambulatory Visit | Attending: Radiation Oncology | Admitting: Radiation Oncology

## 2021-08-22 DIAGNOSIS — Z170 Estrogen receptor positive status [ER+]: Secondary | ICD-10-CM | POA: Diagnosis not present

## 2021-08-22 DIAGNOSIS — Z510 Encounter for antineoplastic radiation therapy: Secondary | ICD-10-CM | POA: Diagnosis not present

## 2021-08-22 DIAGNOSIS — C50811 Malignant neoplasm of overlapping sites of right female breast: Secondary | ICD-10-CM | POA: Diagnosis not present

## 2021-08-23 ENCOUNTER — Ambulatory Visit
Admission: RE | Admit: 2021-08-23 | Discharge: 2021-08-23 | Disposition: A | Discharge status: Home / Self Care | Payer: Medicare Other | Source: Ambulatory Visit | Attending: Radiation Oncology | Admitting: Radiation Oncology

## 2021-08-23 ENCOUNTER — Other Ambulatory Visit: Payer: Self-pay

## 2021-08-23 DIAGNOSIS — Z510 Encounter for antineoplastic radiation therapy: Secondary | ICD-10-CM | POA: Diagnosis not present

## 2021-08-23 DIAGNOSIS — Z170 Estrogen receptor positive status [ER+]: Secondary | ICD-10-CM | POA: Diagnosis not present

## 2021-08-23 DIAGNOSIS — C50811 Malignant neoplasm of overlapping sites of right female breast: Secondary | ICD-10-CM | POA: Diagnosis not present

## 2021-08-24 ENCOUNTER — Ambulatory Visit
Admission: RE | Admit: 2021-08-24 | Discharge: 2021-08-24 | Disposition: A | Discharge status: Home / Self Care | Payer: Medicare Other | Source: Ambulatory Visit | Attending: Radiation Oncology | Admitting: Radiation Oncology

## 2021-08-24 ENCOUNTER — Encounter: Payer: Self-pay | Admitting: Radiation Oncology

## 2021-08-24 DIAGNOSIS — Z510 Encounter for antineoplastic radiation therapy: Secondary | ICD-10-CM | POA: Diagnosis not present

## 2021-08-24 DIAGNOSIS — C50811 Malignant neoplasm of overlapping sites of right female breast: Secondary | ICD-10-CM | POA: Diagnosis not present

## 2021-08-24 DIAGNOSIS — Z170 Estrogen receptor positive status [ER+]: Secondary | ICD-10-CM | POA: Diagnosis not present

## 2021-08-30 ENCOUNTER — Telehealth: Payer: Self-pay

## 2021-08-30 NOTE — Telephone Encounter (Signed)
  12PM: Palliative care SW outreached patient/family for monthly telephonic visit and to schedule in person visit.  Call unsuccessful. SW left HIPPA compliant VM.  Awaiting return call. Will outreach again at later date and continue to offer palliative care support.

## 2021-09-07 ENCOUNTER — Other Ambulatory Visit (HOSPITAL_COMMUNITY): Payer: Self-pay

## 2021-09-07 MED ORDER — CLOPIDOGREL BISULFATE 75 MG PO TABS
75.0000 mg | ORAL_TABLET | Freq: Every day | ORAL | 1 refills | Status: DC
  Filled 2021-09-07: qty 90, 90d supply, fill #0

## 2021-09-10 ENCOUNTER — Encounter: Payer: Self-pay | Admitting: Oncology

## 2021-09-10 ENCOUNTER — Other Ambulatory Visit (HOSPITAL_COMMUNITY): Payer: Self-pay

## 2021-09-10 ENCOUNTER — Ambulatory Visit: Payer: Medicare Other | Admitting: Oncology

## 2021-09-13 ENCOUNTER — Other Ambulatory Visit: Payer: Self-pay | Admitting: *Deleted

## 2021-09-13 ENCOUNTER — Telehealth: Payer: Self-pay

## 2021-09-13 DIAGNOSIS — Z170 Estrogen receptor positive status [ER+]: Secondary | ICD-10-CM

## 2021-09-13 DIAGNOSIS — C50811 Malignant neoplasm of overlapping sites of right female breast: Secondary | ICD-10-CM

## 2021-09-13 NOTE — Progress Notes (Signed)
Received call from pt with complaint of right lower extremity swelling x1 week.  Pt denies recent injury or trauma.  Per MD pt needing VAS [place] RLE for further evaluation.  Orders placed and appt scheduled.  RN attempt x1 to contact pt regarding appt details.  No answer, LVM to return call to the office.  RN will attempt again later.

## 2021-09-13 NOTE — Telephone Encounter (Signed)
VM from patient's daughter forwarded to my phone. Daughter reports that patient has recently begun experiencing increase in swelling to right leg/foot. Requesting call-back with recommendations or how to proceed.   Returned daughter's call to gather more information. Per daughter: swelling to right leg/foot is different and more prominent than patient's typical edema from poor circulation/decreased movement. Daughter states she noticed increased swelling about a week ago. Denies any redness or warmth to calf or foot, but does state the skin looks very taut. Patient denies pain, but does report an uncomfortableness/achy sensation. Daughter states patient's gait is more cumbersome because of discomfort. Daughter denies any fevers or medication changes. Informed daughter I will make patient's medical oncology team aware, and someone will call her back with an update/instructions. Daughter verbalized understanding and agreement.   Will route telephone message to oncology team

## 2021-09-14 ENCOUNTER — Other Ambulatory Visit: Payer: Self-pay

## 2021-09-14 ENCOUNTER — Inpatient Hospital Stay: Payer: Medicare Other | Attending: Adult Health | Admitting: Physician Assistant

## 2021-09-14 ENCOUNTER — Ambulatory Visit (HOSPITAL_COMMUNITY)
Admission: RE | Admit: 2021-09-14 | Discharge: 2021-09-14 | Disposition: A | Discharge status: Home / Self Care | Payer: Medicare Other | Source: Ambulatory Visit | Attending: Oncology | Admitting: Oncology

## 2021-09-14 VITALS — BP 148/61 | HR 95 | Temp 98.6°F | Resp 18 | Wt 221.7 lb

## 2021-09-14 DIAGNOSIS — M7989 Other specified soft tissue disorders: Secondary | ICD-10-CM | POA: Diagnosis not present

## 2021-09-14 DIAGNOSIS — I878 Other specified disorders of veins: Secondary | ICD-10-CM | POA: Insufficient documentation

## 2021-09-14 DIAGNOSIS — Z170 Estrogen receptor positive status [ER+]: Secondary | ICD-10-CM | POA: Diagnosis not present

## 2021-09-14 DIAGNOSIS — Z8673 Personal history of transient ischemic attack (TIA), and cerebral infarction without residual deficits: Secondary | ICD-10-CM | POA: Diagnosis not present

## 2021-09-14 DIAGNOSIS — C50811 Malignant neoplasm of overlapping sites of right female breast: Secondary | ICD-10-CM | POA: Insufficient documentation

## 2021-09-14 NOTE — Progress Notes (Signed)
Symptom Management Consult note [place]    Patient Care Team: [name], DO as PCP - General (Family Medicine) Waynetta Sandy, MD as Consulting Physician (Vascular Surgery) Patsey Berthold, NP as Nurse Practitioner (Cardiology) [name], MD as Consulting Physician (Physical Medicine and Rehabilitation) Edgardo Roys, PsyD as Consulting Physician (Psychology) Magrinat, [name], MD as Consulting Physician (Oncology) [name], MD as Consulting Physician (General Surgery) Eppie Gibson, MD as Attending Physician (Radiation Oncology)    Name of the patient: [name] [name]  [id number]  1946-02-11   Date of visit: 09/14/2021    Chief complaint/ Reason for visit- right leg swelling  Oncology History  Malignant neoplasm of overlapping sites of right breast in female, estrogen receptor positive ([place])  08/24/2018 Initial Diagnosis   Malignant neoplasm of overlapping sites of right breast in female, estrogen receptor positive ([place])   09/16/2018 - 12/21/2018 Chemotherapy   The patient had dexamethasone (DECADRON) 4 MG tablet, 8 mg, Oral, Daily, 1 of 1 cycle, Start date: --, End date: -- palonosetron (ALOXI) injection 0.25 mg, 0.25 mg, Intravenous,  Once, 4 of 8 cycles Administration: 0.25 mg (09/16/2018), 0.25 mg (10/09/2018), 0.25 mg (11/30/2018), 0.25 mg (12/18/2018) methotrexate (PF) chemo injection 76 mg, 39.8 mg/m2 = 76.5 mg, Intravenous,  Once, 4 of 8 cycles Administration: 76 mg (09/16/2018), 76 mg (10/09/2018), 76 mg (11/30/2018), 76 mg (12/18/2018) pegfilgrastim-cbqv (UDENYCA) injection 6 mg, 6 mg, Subcutaneous, Once, 1 of 5 cycles Administration: 6 mg (12/21/2018) cyclophosphamide (CYTOXAN) 1,140 mg in sodium chloride 0.9 % 250 mL chemo infusion, 600 mg/m2 = 1,140 mg, Intravenous,  Once, 4 of 8 cycles Administration: 1,140 mg (09/16/2018), 1,140 mg (10/09/2018), 1,140 mg (11/30/2018), 1,140 mg (12/18/2018) fluorouracil (ADRUCIL) chemo  injection 1,150 mg, 600 mg/m2 = 1,150 mg, Intravenous,  Once, 4 of 8 cycles Administration: 1,150 mg (09/16/2018), 1,150 mg (10/09/2018), 1,150 mg (11/30/2018), 1,150 mg (12/18/2018)   for chemotherapy treatment.     08/18/2019 - 10/27/2019 Chemotherapy   The patient had PACLitaxel (TAXOL) 162 mg in sodium chloride 0.9 % 250 mL chemo infusion (</= 80mg /m2), 80 mg/m2 = 162 mg, Intravenous,  Once, 3 of 6 cycles Administration: 162 mg (08/18/2019), 162 mg (08/25/2019), 162 mg (09/15/2019), 162 mg (09/29/2019), 162 mg (10/13/2019), 162 mg (10/27/2019)   for chemotherapy treatment.     Inflammatory breast cancer, right ([place])  08/26/2018 Initial Diagnosis   Inflammatory breast cancer, right ([place])   09/16/2018 - 12/21/2018 Chemotherapy   The patient had dexamethasone (DECADRON) 4 MG tablet, 8 mg, Oral, Daily, 1 of 1 cycle, Start date: --, End date: -- palonosetron (ALOXI) injection 0.25 mg, 0.25 mg, Intravenous,  Once, 4 of 8 cycles Administration: 0.25 mg (09/16/2018), 0.25 mg (10/09/2018), 0.25 mg (11/30/2018), 0.25 mg (12/18/2018) methotrexate (PF) chemo injection 76 mg, 39.8 mg/m2 = 76.5 mg, Intravenous,  Once, 4 of 8 cycles Administration: 76 mg (09/16/2018), 76 mg (10/09/2018), 76 mg (11/30/2018), 76 mg (12/18/2018) pegfilgrastim-cbqv (UDENYCA) injection 6 mg, 6 mg, Subcutaneous, Once, 1 of 5 cycles Administration: 6 mg (12/21/2018) cyclophosphamide (CYTOXAN) 1,140 mg in sodium chloride 0.9 % 250 mL chemo infusion, 600 mg/m2 = 1,140 mg, Intravenous,  Once, 4 of 8 cycles Administration: 1,140 mg (09/16/2018), 1,140 mg (10/09/2018), 1,140 mg (11/30/2018), 1,140 mg (12/18/2018) fluorouracil (ADRUCIL) chemo injection 1,150 mg, 600 mg/m2 = 1,150 mg, Intravenous,  Once, 4 of 8 cycles Administration: 1,150 mg (09/16/2018), 1,150 mg (10/09/2018), 1,150 mg (11/30/2018), 1,150 mg (12/18/2018)   for chemotherapy treatment.  05/25/2019 - 05/25/2019 Chemotherapy   The patient had pembrolizumab (KEYTRUDA) 200 mg in sodium  chloride 0.9 % 50 mL chemo infusion, 200 mg, Intravenous, Once, 0 of 6 cycles  for chemotherapy treatment.     08/18/2019 - 10/27/2019 Chemotherapy   The patient had PACLitaxel (TAXOL) 162 mg in sodium chloride 0.9 % 250 mL chemo infusion (</= 80mg /m2), 80 mg/m2 = 162 mg, Intravenous,  Once, 3 of 6 cycles Administration: 162 mg (08/18/2019), 162 mg (08/25/2019), 162 mg (09/15/2019), 162 mg (09/29/2019), 162 mg (10/13/2019), 162 mg (10/27/2019)   for chemotherapy treatment.       Current Therapy: no current chemotherapy. Recently finished radiation  Interval history- [name] [name] is a 75 yo female with history as above presents to [facility] today with chief complaint of right leg swelling x 1 week. Patient's daughter accompanies her and provides most of the history. She states for the last year patient has had swelling in bilateral legs. Over the last week the swelling in the right leg worsened. Patient has mentioned swelling to pcp in the past however does not think any testing or work up has been done for it. She denies having been to a cardiologist or any heart issues besides hypertension. Patient did see vascular surgeon in the past after having a stroke, is not followed by one anymore. Patient denies any known injury to her leg. She admits to decreased activity at home and does not elevate legs frequently. She does endorse associated pain that she describes as an aching sensation in lower legs. Pain is intermittent, she rates is 2/10 in severity. She ambulates with a walker at home when needed. Patient admits to compliance with plavix. She states while in a rehab facility after having a stroke earlier this year her leg swelling was much improved and she had to be active while there. She occasionally wears compression stockings, has not in awhile however. Thinks they might have been helping, cannot remember. Denies fever, chills, shortness of breath, chest pain, numbness, tingling, weakness. Denies any  recent surgeries. Denies eating high salt diet.   ROS  All other systems are reviewed and are negative for acute change except as noted in the HPI.    No Known Allergies   Past Medical History:  Diagnosis Date   Anxiety    Cancer ([place])    breast cancer- right   Carotid stenosis    Depression    situational   Dyspnea    with much ambulation   GERD (gastroesophageal reflux disease)    Hypertension    Stroke ([place]) 04/01/2017   a little weakness right side leg and hand, a little memry loss     Past Surgical History:  Procedure Laterality Date   ABDOMINAL HYSTERECTOMY     ENDARTERECTOMY Left 04/08/2017   Procedure: ENDARTERECTOMY CAROTID;  Surgeon: Waynetta Sandy, MD;  Location: [place];  Service: Vascular;  Laterality: Left;   PATCH ANGIOPLASTY Left 04/08/2017   Procedure: PATCH ANGIOPLASTY Left Carotid;  Surgeon: Waynetta Sandy, MD;  Location: [place];  Service: Vascular;  Laterality: Left;   PORTACATH PLACEMENT Left 09/07/2018   Procedure: INSERTION PORT-A-CATH LEFT INTERNAL JUGULAR;  Surgeon: [name], MD;  Location: [facility];  Service: General;  Laterality: Left;    Social History   Socioeconomic History   Marital status: Divorced    Spouse name: Not on file   Number of children: Not on file   Years of education: Not on file   Highest education  level: Not on file  Occupational History   Not on file  Tobacco Use   Smoking status: Never   Smokeless tobacco: Never  Vaping Use   Vaping Use: Never used  Substance and Sexual Activity   Alcohol use: No   Drug use: No   Sexual activity: Not Currently  Other Topics Concern   Not on file  Social History Narrative   Not on file   Social Determinants of Health   Financial Resource Strain: Not on file  Food Insecurity: Not on file  Transportation Needs: Not on file  Physical Activity: Not on file  Stress: Not on file  Social Connections: Not on file  Intimate Partner Violence: Not on file     Family History  Problem Relation Age of Onset   Cancer Mother        ovarian     Current Outpatient Medications:    amLODipine (NORVASC) 10 MG tablet, Take 1 tablet by mouth once a day., Disp: 90 tablet, Rfl: 2   atorvastatin (LIPITOR) 80 MG tablet, TAKE 1 TABLET BY MOUTH ONCE DAILY, Disp: 90 tablet, Rfl: 0   capecitabine (XELODA) 500 MG tablet, Take 2 tablets (1,000 mg total) by mouth daily. Take within 30 minutes after breakfast., Disp: 60 tablet, Rfl: 6   capecitabine (XELODA) 500 MG tablet, Take 2 tablets (1000mg ) by mouth daily.  Take within 30 min after breakfast., Disp: 60 tablet, Rfl: 1   carvedilol (COREG) 3.125 MG tablet, Take 1 tablet (3.125 mg total) by mouth 2 (two) times daily with a meal., Disp: , Rfl:    clopidogrel (PLAVIX) 75 MG tablet, Take 1 tablet (75 mg total) by mouth daily., Disp: 90 tablet, Rfl: 3   clopidogrel (PLAVIX) 75 MG tablet, TAKE 1 TABLET BY MOUTH ONCE DAILY, Disp: 90 tablet, Rfl: 1   latanoprost (XALATAN) 0.005 % ophthalmic solution, PLACE 1 DROP IN BOTH EYES EVERY EVENING, Disp: 7.5 mL, Rfl: 3   lisinopril-hydrochlorothiazide (ZESTORETIC) 20-12.5 MG tablet, Take 1 tablet by mouth daily, Disp: 90 tablet, Rfl: 3   loratadine (CLARITIN) 10 MG tablet, Take 10 mg by mouth daily as needed for allergies., Disp: , Rfl:    amLODipine (NORVASC) 10 MG tablet, TAKE 1 TABLET BY MOUTH ONCE DAILY, Disp: 90 tablet, Rfl: 3  PHYSICAL EXAM: ECOG FS:1 - Symptomatic but completely ambulatory    Vitals:   09/14/21 1127  BP: (!) 148/61  Pulse: 95  Resp: 18  Temp: 98.6 F (37 C)  TempSrc: Oral  SpO2: 98%  Weight: 221 lb 11.2 oz (100.6 kg)   Physical Exam Vitals and nursing note reviewed.  Constitutional:      General: She is not in acute distress.    Appearance: She is not ill-appearing.  HENT:     Head: Normocephalic and atraumatic.     Right Ear: External ear normal.     Left Ear: External ear normal.     Nose: Nose normal.     Mouth/Throat:      Mouth: Mucous membranes are moist.     Pharynx: Oropharynx is clear.  Eyes:     General: No scleral icterus.       Right eye: No discharge.        Left eye: No discharge.     Extraocular Movements: Extraocular movements intact.     Conjunctiva/sclera: Conjunctivae normal.     Pupils: Pupils are equal, round, and reactive to light.  Neck:     Vascular: No JVD.  Cardiovascular:  Rate and Rhythm: Normal rate and regular rhythm.     Pulses: Normal pulses.          Radial pulses are 2+ on the right side and 2+ on the left side.       Dorsalis pedis pulses are 2+ on the right side and 2+ on the left side.     Heart sounds: Normal heart sounds.     Comments: Bilateral lower extremity edema worse on R>L. Pitting edema 1 + on right lower extremity. No pitting noted on left lower extremity. Pulmonary:     Comments: Lungs clear to auscultation in all fields. Symmetric chest rise. No wheezing, rales, or rhonchi. Abdominal:     Comments: Abdomen is soft, non-distended, and non-tender in all quadrants. No rigidity, no guarding. No peritoneal signs.  Musculoskeletal:        General: Normal range of motion.     Cervical back: Normal range of motion.     Comments: No palpable cords, compartments are soft in bilateral lower extremities although right calf is tighter compared to the left.     Skin:    General: Skin is warm and dry.     Capillary Refill: Capillary refill takes less than 2 seconds.     Comments: Equal tactile temperature in all extremities. Thickened and darkened skin on anterior and medial right lower extremity more prominent than on left lower extremity. No open wounds seen on bilateral lower extremities or feet  Neurological:     Mental Status: She is oriented to person, place, and time.     GCS: GCS eye subscore is 4. GCS verbal subscore is 5. GCS motor subscore is 6.     Comments: Fluent speech, no facial droop.  Psychiatric:        Behavior: Behavior normal.        LABORATORY DATA: I have reviewed the data as listed CBC Latest Ref Rng & Units 07/26/2021 05/28/2021 05/27/2021  WBC 4.0 - 10.5 K/uL 6.5 3.8(L) 4.6  Hemoglobin 12.0 - 15.0 g/dL 11.0(L) 10.3(L) 11.4(L)  Hematocrit 36.0 - 46.0 % 35.2(L) 33.1(L) 37.5  Platelets 150 - 400 K/uL 208 172 PLATELET CLUMPS NOTED ON SMEAR, UNABLE TO ESTIMATE     CMP Latest Ref Rng & Units 07/26/2021 05/28/2021 05/27/2021  Glucose 70 - 99 mg/dL 107(H) 122(H) 116(H)  BUN 8 - 23 mg/dL 17 18 19   Creatinine 0.44 - 1.00 mg/dL 1.06(H) 0.93 1.04(H)  Sodium 135 - 145 mmol/L 143 136 135  Potassium 3.5 - 5.1 mmol/L 3.7 4.1 4.2  Chloride 98 - 111 mmol/L 108 104 102  CO2 22 - 32 mmol/L 26 24 26   Calcium 8.9 - 10.3 mg/dL 9.4 8.1(L) 8.2(L)  Total Protein 6.5 - 8.1 g/dL 7.7 6.2(L) 6.2(L)  Total Bilirubin 0.3 - 1.2 mg/dL 0.4 0.8 0.5  Alkaline Phos 38 - 126 U/L 138(H) 88 85  AST 15 - 41 U/L 17 24 29   ALT 0 - 44 U/L 11 18 19        RADIOGRAPHIC STUDIES: I have personally reviewed the radiological images as listed and agreed with the findings in the report. No images are attached to the encounter. VAS [place] LOWER EXTREMITY VENOUS (DVT)  Result Date: 09/14/2021  Lower Venous DVT Study Patient Name:  [facility] [place]  Date of Exam:   09/14/2021 Medical Rec #: [id number]      Accession #:    [id number] Date of Birth: 1945-12-17      Patient Gender: F Patient Age:  41 years Exam Location:  [facility] Procedure:      VAS [place] LOWER EXTREMITY VENOUS (DVT) Referring Phys: Lurline Del --------------------------------------------------------------------------------  Indications: Swelling.  Risk Factors: Cancer. Limitations: Poor ultrasound/tissue interface. Comparison Study: No prior studies. Performing Technologist: Oliver Hum RVT  Examination Guidelines: A complete evaluation includes B-mode imaging, spectral Doppler, color Doppler, and power Doppler as needed of all accessible portions of each vessel. Bilateral testing is  considered an integral part of a complete examination. Limited examinations for reoccurring indications may be performed as noted. The reflux portion of the exam is performed with the patient in reverse Trendelenburg.  +---------+---------------+---------+-----------+----------+--------------+ RIGHT    CompressibilityPhasicitySpontaneityPropertiesThrombus Aging +---------+---------------+---------+-----------+----------+--------------+ CFV      Full           Yes      Yes                                 +---------+---------------+---------+-----------+----------+--------------+ SFJ      Full                                                        +---------+---------------+---------+-----------+----------+--------------+ FV Prox  Full                                                        +---------+---------------+---------+-----------+----------+--------------+ FV Mid   Full                                                        +---------+---------------+---------+-----------+----------+--------------+ FV Distal               Yes      Yes                                 +---------+---------------+---------+-----------+----------+--------------+ PFV      Full                                                        +---------+---------------+---------+-----------+----------+--------------+ POP      Full           Yes      Yes                                 +---------+---------------+---------+-----------+----------+--------------+ PTV      Full                                                        +---------+---------------+---------+-----------+----------+--------------+ PERO  Full                                                        +---------+---------------+---------+-----------+----------+--------------+   +----+---------------+---------+-----------+----------+--------------+  LEFTCompressibilityPhasicitySpontaneityPropertiesThrombus Aging +----+---------------+---------+-----------+----------+--------------+ CFV Full           Yes      Yes                                 +----+---------------+---------+-----------+----------+--------------+     Summary: RIGHT: - There is no evidence of deep vein thrombosis in the lower extremity. However, portions of this examination were limited- see technologist comments above.  - No cystic structure found in the popliteal fossa.  LEFT: - No evidence of common femoral vein obstruction.  *See table(s) above for measurements and observations. Electronically signed by Monica Martinez MD on 09/14/2021 at 12:33:59 PM.    Final      ASSESSMENT & PLAN: Patient is a 75 y.o. female with history of inflammatory right breast cancer who recently finished radiation, not currently on chemotherapy as she was continuing to have disease progression while on chemo followed by oncologist Dr. Jana Hakim.  #)right leg swelling- Patient with bilateral lower extremity edema R>L. No signs of trauma or injury. No abscess of infection. Does have thickened and darkened skin on bilateral ankles. She denies any cardiac symptoms, not followed by cardiologist. DVT study prelim is negative for DVT. Discussed with patient and her daughter the possibility of PVD vs other cardiac etiology that could be causing symptoms. Encouraged patient to follow up with cardiology and offered to place referral. Patient politely declines. Discussed symptomatic management with elevation, compression, low salt diet and strongly encouraged to at least follow up with pcp to further discuss edema. Patient and daughter are agreeable with plan of care.    #) inflammatory breast cancer- continue observation and palliative care per primary oncologist   Visit Diagnosis: 1. Leg swelling   2. Malignant neoplasm of overlapping sites of right breast in female, estrogen receptor positive  ([place])      No orders of the defined types were placed in this encounter.   All questions were answered. The patient knows to call the clinic with any problems, questions or concerns. No barriers to learning was detected.  I have spent a total of 20 minutes minutes of face-to-face and non-face-to-face time, preparing to see the patient, obtaining and/or reviewing separately obtained history, performing a medically appropriate examination, counseling and educating the patient, ordering tests,  documenting clinical information in the electronic health record, and care coordination.     Thank you for allowing me to participate in the care of this patient.    Barrie Folk, PA-C Department of Hematology/Oncology [facility] Phone: [phone]  [phone]    09/14/2021 1:25 PM

## 2021-09-14 NOTE — Patient Instructions (Signed)
Follow up with primary care doctor about your leg swelling.    Try to elevate your legs as much as possible and wear compression socks.

## 2021-09-14 NOTE — Progress Notes (Signed)
Right lower extremity venous duplex has been completed. Preliminary results can be found in CV Proc through chart review.  Results were given to [facility] at Dr. [name] office.  09/14/21 10:38 AM [name] [name] RVT

## 2021-09-25 ENCOUNTER — Telehealth: Payer: Self-pay

## 2021-09-25 NOTE — Telephone Encounter (Signed)
10:14 AM: Palliative care SW outreached patients daughter, to return her TC..   Call unsuccessful. SW left HIPPA compliant VM wit contact info.  Awaiting return call.

## 2021-09-25 NOTE — Progress Notes (Signed)
[place]  Telephone:([phone]) [phone] Fax:(336) [phone]    ID: [name] [name] DOB: 1945-11-22  MR#: [id number]  DGU#:[id number]  Patient Care Team: [name] Morning, DO as PCP - General (Family Medicine) [name] Sandy, MD as Consulting Physician (Vascular Surgery) [name] Berthold, NP as Nurse Practitioner (Cardiology) [name] Staggers, MD as Consulting Physician (Physical Medicine and Rehabilitation) [name] Roys, PsyD as Consulting Physician (Psychology) [name] [name], [name] Dad, MD as Consulting Physician (Oncology) [name] Kussmaul, MD as Consulting Physician (General Surgery) [name] Gibson, MD as Attending Physician (Radiation Oncology) OTHER MD:   CHIEF COMPLAINT: Inflammatory breast cancer  CURRENT TREATMENT: Best supportive care under hospice at home (pending)   INTERVAL HISTORY: [name] [name] returns today for follow up of her inflammatory breast cancer, accompanied by her daughter [name] [name].   We discontinued her capecitabine at her last visit on 07/26/2021 due to progression in her chest wall tumor.  She was referred back to Dr. Isidore [name] for consideration of additional palliative radiation therapy to the area. She subsequently received 5 fractions of treatment from 10/10/202 through 08/24/2021.  She was seen at the symptom management clinic on 09/14/2021 with complaints of leg swelling.  Doppler ultrasonography the same day showed no evidence of DVT.   REVIEW OF SYSTEMS: [name] [name] tolerated the radiation well but she tells me there really has been no benefit.  The wound continues to seep and to grow in the upper chest and they are of course numerous other areas of skin involvement in the abdomen and left chest and elsewhere.  [name] [name] denies unusual headaches visual changes nausea vomiting cough phlegm production pleurisy falls pain diarrhea or constipation.  She says she is "fine".  Her daughter is concerned that as she is having some changes in her feet that she  described as peeling.  These are imaged below.  COVID 19 VACCINATION STATUS: [place] x2, most recently 04/2020   HISTORY OF CURRENT ILLNESS: From the original intake note:  [name] [name] presented with right breast discoloration and redness. The right breast also had an area of hardness and nipple retraction.  She did not bring this to medical attention but her daughter did mention it when the patient had neurologic follow-up on 08/10/2018.  At that time the nurse practitioner, [name] [name], noted right nipple inversion, hard breast, and red non-ulcerated lesions on the breast.  The patient then underwent bilateral diagnostic mammography with tomography and right breast ultrasonography at [facility] on 08/11/2018 (this is her first ever mammogram) showing: breast density category B. There was a round 2.5 cm mass at the 1 o'clock upper inner quadrant of the right breast.  By palpation this measured approximately 10 cm.  Sonographically the irregular hypoechoic mass measured 2.7 cm. There was also a hypoechoic mass in the right breast at 9 o'clock upper outer quadrant measuring 3.8 cm. There was an abnormal right axillary lymph node with cortical thickening.   Accordingly on 08/19/2018 she proceeded to biopsy of the right breast areas in question. The pathology from this procedure showed [phone]): At the 1 o'clock: invasive lobular carcinoma, grade II. Prognostic indicators significant for: estrogen receptor, 60% positive with moderate staining intensity and progesterone receptor, 0% negative. Proliferation marker Ki67 at 20%. HER2 negative with an immunohistochemistry of (1+).  At the 9 o'clock: invasive lobular carcinoma, grade II. Prognostic indicators significant for: estrogen receptor, 90% positive with strong staining intensity and progesterone receptor, 0% negative. Proliferation marker Ki67 at 30%. HER2 negative with an immunohistochemistry of (1+).  The patient's subsequent  history is as detailed  below.   PAST MEDICAL HISTORY: Past Medical History:  Diagnosis Date   Anxiety    Cancer [facility])    breast cancer- right   Carotid stenosis    Depression    situational   Dyspnea    with much ambulation   GERD (gastroesophageal reflux disease)    Hypertension    Stroke ([place]) 04/01/2017   a little weakness right side leg and hand, a little memry loss    PAST SURGICAL HISTORY: Past Surgical History:  Procedure Laterality Date   ABDOMINAL HYSTERECTOMY     ENDARTERECTOMY Left 04/08/2017   Procedure: ENDARTERECTOMY CAROTID;  Surgeon: [name] Sandy, MD;  Location: [place];  Service: Vascular;  Laterality: Left;   PATCH ANGIOPLASTY Left 04/08/2017   Procedure: PATCH ANGIOPLASTY Left Carotid;  Surgeon: [name] Sandy, MD;  Location: [place];  Service: Vascular;  Laterality: Left;   PORTACATH PLACEMENT Left 09/07/2018   Procedure: INSERTION PORT-A-CATH LEFT INTERNAL JUGULAR;  Surgeon: [name] Kussmaul, MD;  Location: [place];  Service: General;  Laterality: Left;  total hysterectomy with bilateral salpingo-oophorectomy in her 31's no hormone replacement   FAMILY HISTORY: Family History  Problem Relation Age of Onset   Cancer Mother        ovarian  The patient's father is alive at age 30. The patient's mother died at age 68 due to ovarian cancer. The patient had 3 brothers and 3 sisters. She denies a history of breast, pancreatic, colon, or other cancers in the family.    GYNECOLOGIC HISTORY:  No LMP recorded. Patient has had a hysterectomy. Menarche: Age at first live birth: 75 years old She is GX P1.  She is status post total hysterectomy with BSO in her early 60's. She did not use HRT.    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. She is divorced. At home is just herself, but her two grandsons regularly come to visit and stay with her. The patient's daughter, [name] [name] works in administration for [employer]. The patient's 29 y.o.  grandson is a Freight forwarder for [employer]. The patient's 73 y.o. grandson works in [employer].    ADVANCED DIRECTIVES: Not in place.  At the 08/26/2018 visit the patient was given the appropriate documents to complete and notarized at her discretion.   HEALTH MAINTENANCE: Social History   Tobacco Use   Smoking status: Never   Smokeless tobacco: Never  Vaping Use   Vaping Use: Never used  Substance Use Topics   Alcohol use: No   Drug use: No     Colonoscopy: Never  PAP: Status post hysterectomy  Bone density: Never   No Known Allergies  Current Outpatient Medications  Medication Sig Dispense Refill   amLODipine (NORVASC) 10 MG tablet TAKE 1 TABLET BY MOUTH ONCE DAILY 90 tablet 3   amLODipine (NORVASC) 10 MG tablet Take 1 tablet by mouth once a day. 90 tablet 2   atorvastatin (LIPITOR) 80 MG tablet TAKE 1 TABLET BY MOUTH ONCE DAILY 90 tablet 0   capecitabine (XELODA) 500 MG tablet Take 2 tablets (1,000 mg total) by mouth daily. Take within 30 minutes after breakfast. 60 tablet 6   carvedilol (COREG) 3.125 MG tablet Take 1 tablet (3.125 mg total) by mouth 2 (two) times daily with a meal.     clopidogrel (PLAVIX) 75 MG tablet Take 1 tablet (75 mg total) by mouth daily. 90 tablet 3   clopidogrel (PLAVIX) 75  MG tablet TAKE 1 TABLET BY MOUTH ONCE DAILY 90 tablet 1   latanoprost (XALATAN) 0.005 % ophthalmic solution PLACE 1 DROP IN BOTH EYES EVERY EVENING 7.5 mL 3   lisinopril-hydrochlorothiazide (ZESTORETIC) 20-12.5 MG tablet Take 1 tablet by mouth daily 90 tablet 3   loratadine (CLARITIN) 10 MG tablet Take 10 mg by mouth daily as needed for allergies.     No current facility-administered medications for this visit.    OBJECTIVE: African-American woman examined in a wheelchair.  Vitals:   09/26/21 1431  BP: (!) 135/56  Pulse: 100  Resp: 18  Temp: 98.6 F (37 C)  SpO2: 100%     Wt Readings from Last 3 Encounters:  09/26/21 226 lb 12.8 oz (102.9 kg)  09/14/21 221 lb 11.2 oz  (100.6 kg)  05/26/21 225 lb 12 oz (102.4 kg)   Body mass index is 33.49 kg/m.    ECOG FS:2 - Symptomatic, <50% confined to bed  Sclerae unicteric, EOMs intact Lungs no rales or rhonchi Heart regular rate and rhythm Abd soft, obese, nontender MSK the right lower extremity which is where the daughter was concerned is imaged below.  What we are seeing is chronic venostasis changes.  There is moderate brawny edema.  There is no evidence of infection or inflammation. Neuro: nonfocal, well oriented, appropriate affect Breasts: The main mass in the upper anterior chest is now approximately 3 x 3 cm, fungating with no evidence of drying up or eschar formation.  There are multiple other skin lesions as noted previously.   Lower right leg 09/26/2021    Right breast 07/26/2021   Right breast 04/26/2021  Right breast 05/24/2020     Right breast 04/19/2020      Right breast 02/02/2020     Right breast 08/12/2019      LAB RESULTS:  CMP     Component Value Date/Time   NA 143 07/26/2021 1256   K 3.7 07/26/2021 1256   CL 108 07/26/2021 1256   CO2 26 07/26/2021 1256   GLUCOSE 107 (H) 07/26/2021 1256   BUN 17 07/26/2021 1256   CREATININE 1.06 (H) 07/26/2021 1256   CREATININE 1.19 (H) 04/26/2021 1347   CALCIUM 9.4 07/26/2021 1256   PROT 7.7 07/26/2021 1256   ALBUMIN 3.7 07/26/2021 1256   AST 17 07/26/2021 1256   AST 18 04/26/2021 1347   ALT 11 07/26/2021 1256   ALT 19 04/26/2021 1347   ALKPHOS 138 (H) 07/26/2021 1256   BILITOT 0.4 07/26/2021 1256   BILITOT 0.3 04/26/2021 1347   GFRNONAA 55 (L) 07/26/2021 1256   GFRNONAA 48 (L) 04/26/2021 1347   GFRAA >60 05/24/2020 1522   GFRAA 58 (L) 11/03/2018 1017    No results found for: TOTALPROTELP, ALBUMINELP, A1GS, A2GS, BETS, BETA2SER, GAMS, MSPIKE, SPEI  No results found for: KPAFRELGTCHN, LAMBDASER, KAPLAMBRATIO  Lab Results  Component Value Date   WBC 6.5 07/26/2021   NEUTROABS 3.7 07/26/2021   HGB 11.0 (L)  07/26/2021   HCT 35.2 (L) 07/26/2021   MCV 81.7 07/26/2021   PLT 208 07/26/2021    No results found for: LABCA2  No components found for: TKWIOX735  No results for input(s): INR in the last 168 hours.  No results found for: LABCA2  No results found for: HGD924  No results found for: QAS341  No results found for: DQQ229  Lab Results  Component Value Date   CA2729 78.6 (H) 08/18/2019    No components found for: HGQUANT  No results found  for: CEA1 / No results found for: CEA1   No results found for: AFPTUMOR  No results found for: CHROMOGRNA  No results found for: HGBA, HGBA2QUANT, HGBFQUANT, HGBSQUAN (Hemoglobinopathy evaluation)   No results found for: LDH  No results found for: IRON, TIBC, IRONPCTSAT (Iron and TIBC)  No results found for: FERRITIN  Urinalysis    Component Value Date/Time   COLORURINE STRAW (A) 05/23/2021 2014   APPEARANCEUR CLEAR 05/23/2021 2014   LABSPEC 1.035 (H) 05/23/2021 2014   PHURINE 7.0 05/23/2021 2014   [place] 05/23/2021 2014   HGBUR [place] (A) 05/23/2021 2014   [place] NEGATIVE 05/23/2021 2014   [place] NEGATIVE 05/23/2021 2014   PROTEINUR NEGATIVE 05/23/2021 2014   NITRITE NEGATIVE 05/23/2021 2014   LEUKOCYTESUR TRACE (A) 05/23/2021 2014    STUDIES: VAS [place] LOWER EXTREMITY VENOUS (DVT)  Result Date: 09/14/2021  Lower Venous DVT Study Patient Name:  [name] [name]  Date of Exam:   09/14/2021 Medical Rec #: [id number]      Accession #:    [id number] Date of Birth: 1946-04-08      Patient Gender: F Patient Age:   51 years Exam Location:  [facility] Procedure:      VAS [place] LOWER EXTREMITY VENOUS (DVT) Referring Phys: Lurline Del --------------------------------------------------------------------------------  Indications: Swelling.  Risk Factors: Cancer. Limitations: Poor ultrasound/tissue interface. Comparison Study: No prior studies. Performing Technologist: Oliver Hum RVT  Examination Guidelines: A  complete evaluation includes B-mode imaging, spectral Doppler, color Doppler, and power Doppler as needed of all accessible portions of each vessel. Bilateral testing is considered an integral part of a complete examination. Limited examinations for reoccurring indications may be performed as noted. The reflux portion of the exam is performed with the patient in reverse Trendelenburg.  +---------+---------------+---------+-----------+----------+--------------+ RIGHT    CompressibilityPhasicitySpontaneityPropertiesThrombus Aging +---------+---------------+---------+-----------+----------+--------------+ CFV      Full           Yes      Yes                                 +---------+---------------+---------+-----------+----------+--------------+ SFJ      Full                                                        +---------+---------------+---------+-----------+----------+--------------+ FV Prox  Full                                                        +---------+---------------+---------+-----------+----------+--------------+ FV Mid   Full                                                        +---------+---------------+---------+-----------+----------+--------------+ FV Distal               Yes      Yes                                 +---------+---------------+---------+-----------+----------+--------------+  PFV      Full                                                        +---------+---------------+---------+-----------+----------+--------------+ POP      Full           Yes      Yes                                 +---------+---------------+---------+-----------+----------+--------------+ PTV      Full                                                        +---------+---------------+---------+-----------+----------+--------------+ PERO     Full                                                         +---------+---------------+---------+-----------+----------+--------------+   +----+---------------+---------+-----------+----------+--------------+ LEFTCompressibilityPhasicitySpontaneityPropertiesThrombus Aging +----+---------------+---------+-----------+----------+--------------+ CFV Full           Yes      Yes                                 +----+---------------+---------+-----------+----------+--------------+     Summary: RIGHT: - There is no evidence of deep vein thrombosis in the lower extremity. However, portions of this examination were limited- see technologist comments above.  - No cystic structure found in the popliteal fossa.  LEFT: - No evidence of common femoral vein obstruction.  *See table(s) above for measurements and observations. Electronically signed by Monica Martinez MD on 09/14/2021 at 12:33:59 PM.    Final      ELIGIBLE FOR AVAILABLE RESEARCH PROTOCOL: no   ASSESSMENT: 75 y.o. [place], [place] woman status post right breast biopsy of overlapping sites on 08/19/2018 for a clinical T4 N1, stage IIIB invasive lobular carcinoma, estrogen receptor positive, progesterone receptor negative, HER-2 not amplified, with an MIB-1 of 20%.  (a) CT chest/abd/pelvis 09/02/2018 and bone scan 09/10/2018 show no evidence of stage IV disease  (b) breast MRI 09/14/2018 confirms a multicentric 12.6 cm right breast mass infiltrating pectpralis but w/o associated adenopathy  (1) genetics testing to be scheduled  (2) neoadjuvant chemotherapy consisting of cyclophosphamide, methotrexate and fluorouracil every 21 days x 8, started 09/16/2018, last dose 12/18/2018  (a) CMF chemotherapy stopped after 4 cycles, with no evidence of response by MRI NOV 2019  (5) anastrozole started February 2019  (a) bone density to be obtained  (b) palbociclib added 01/19/2019 at 125 mg daily, 21/7  (c) palbociclib dose decreased to 100 mg daily 21 on 7 off, starting with second cycle, April 2020  (d)  breast MRI 05/05/2019 shows slight improvement, no evidence of progression, however the tumor is not yet resectable.  Chest CT scan same day shows no evidence of metastatic disease  (e) anastrozole and palbociclib discontinued 08/12/2019 with no significant response  (6) foundation 1/PD-L1 testing:  (  a) PD-L1 requested 12/18/2018--insufficient tissue  (b) foundation one testing shows the microsatellite status to be stable, and the mutational burden to be 16/Mb  (c) there are potentially actionable mutations in AKT3, TSC1, KRAS  MAPK1 and RB1  (7) started paclitaxel 08/18/2019, given days 1 and 8 of each 21-28-day cycle  (a) changed to every 14 days beginning with second cycle per patient preference  (b) discontinued after 10/27/2019 dose with no evidence of response (minimal evidence of progression)  (8) fulvestrant started 11/10/2019.  (a) discontinued after 02/02/2020 dose as no evidence of response  (9) definitive surgery considered, felt not to be possible given local extent of disease  (10) adjuvant radiation considered May 2021, patient opted against  (11) started exemestane mid May 2021   (a) started everolimus 04/17/2020--discontinued after repeated diarrhea episodes  (b) exemestane and everolimus discontinued 05/24/2020 with evidence of disease progression  (12) Palliative radiation to the right chest wall tumor 08/14/2020-08/18/2020  (13) Capecitabine $RemoveBeforeDEI'1000mg'[id number]$  daily beginning in 04/29/2021, discontinued 07/26/2021 with progression  (14) received additional palliative radiation, 5 fractions, last dose 08/24/2021  (15) opted against further life-prolonging treatment, hospice referral placed 09/26/2021.   PLAN: Daizee is very clear that she does not want any further chemotherapy.  She and her daughter are agreeable to hospice referral.  They are already followed closely by the palliative care arm of hospice and have an appointment with them tomorrow for evaluation of the right  anterior upper chest lesion which will need special wound care.  I reassured them that the lower extremity issues that they are dealing with are due to chronic venostasis not infection and that the best management is a compression stocking which they already have.  Makaylah is declining and currently having difficulty getting herself dressed.  I explained to [name] [name] that it would not be remarkable if over the next several weeks she becomes bedbound and at that point it may be that transfer to a hospice home would be appropriate.  So long as she can be safe and comfortable at home however that is preferred.  I have made him a return appointment with Dr. Chryl Heck will be taking over this case for me.  It is a virtual visit 10/26/2021 just to make sure that they get to know each other as hospice may have some questions regarding Ajanay's management in the future and Dr. Chryl Heck can best answer those after having met the patient at least virtually.  Total encounter time: 30 minutes  Chauncey Cruel  09/26/21 2:57 PM  Chauncey Cruel, MD Medical Oncology and Hematology [facility] [address] [place], [place] [address] Tel. [phone]    Fax. [phone]   *Total Encounter Time as defined by the Centers for Medicare and Medicaid Services includes, in addition to the face-to-face time of a patient visit (documented in the note above) non-face-to-face time: obtaining and reviewing outside history, ordering and reviewing medications, tests or procedures, care coordination (communications with other health care professionals or caregivers) and documentation in the medical record.

## 2021-09-26 ENCOUNTER — Inpatient Hospital Stay (HOSPITAL_BASED_OUTPATIENT_CLINIC_OR_DEPARTMENT_OTHER): Payer: Medicare Other | Admitting: Oncology

## 2021-09-26 ENCOUNTER — Other Ambulatory Visit: Payer: Self-pay

## 2021-09-26 ENCOUNTER — Other Ambulatory Visit: Payer: Self-pay | Admitting: *Deleted

## 2021-09-26 ENCOUNTER — Encounter: Payer: Self-pay | Admitting: Radiation Oncology

## 2021-09-26 ENCOUNTER — Inpatient Hospital Stay
Admission: RE | Admit: 2021-09-26 | Discharge: 2021-09-26 | Disposition: A | Discharge status: Home / Self Care | Payer: Self-pay | Source: Ambulatory Visit | Attending: Radiation Oncology | Admitting: Radiation Oncology

## 2021-09-26 VITALS — BP 135/56 | HR 100 | Temp 98.6°F | Resp 18 | Ht 69.0 in | Wt 226.8 lb

## 2021-09-26 DIAGNOSIS — C50911 Malignant neoplasm of unspecified site of right female breast: Secondary | ICD-10-CM

## 2021-09-26 DIAGNOSIS — I878 Other specified disorders of veins: Secondary | ICD-10-CM | POA: Diagnosis not present

## 2021-09-26 DIAGNOSIS — Z170 Estrogen receptor positive status [ER+]: Secondary | ICD-10-CM | POA: Diagnosis not present

## 2021-09-26 DIAGNOSIS — C50811 Malignant neoplasm of overlapping sites of right female breast: Secondary | ICD-10-CM

## 2021-09-26 DIAGNOSIS — Z8673 Personal history of transient ischemic attack (TIA), and cerebral infarction without residual deficits: Secondary | ICD-10-CM | POA: Diagnosis not present

## 2021-09-26 DIAGNOSIS — R918 Other nonspecific abnormal finding of lung field: Secondary | ICD-10-CM

## 2021-09-26 DIAGNOSIS — M7989 Other specified soft tissue disorders: Secondary | ICD-10-CM | POA: Diagnosis not present

## 2021-09-26 NOTE — Progress Notes (Signed)
Patient reports no  RT breast pain, but RT foot pain 4/10 w/ skin irritation. No other symptoms reported.  Meaningful use complete.  Hysterectomy- No chances of pregnancy.

## 2021-09-26 NOTE — Progress Notes (Signed)
09/26/21 @10AM : PC SW outreached patients daughter, [name], to return her TC.   Daughter inquired about private caregiver options for patient and payment for such services, as sh will be going out of town soon and patient will require someone be with her in the home. SW advised daughter that private caregivers are typically paid for out of pocket, with Medicaid, or a long term care policy. Patient would need to pay out of pocket for a caregiver. Daughter shared that she has a contact of a caregiver who is a CNA that may be willing to offer assistance. SW also advised daughter to consider outreaching her local school of nursing programs to inquire about students who are looking to pick up shifts.   Daughter shared that patient is having more swelling in lower R leg, providers feel it is due to lack of movement. Due to the swelling patients walking is challenging, as she is dragging her foot more. Patient uses RW. No falls. Patient is now having issues with lifting her legs to get into bed. Daughter expressed that patients care needs have increased. Daughter broached the topic of Hospice and is open to discussing the services with [facility] team after she comes back from out of town.

## 2021-09-26 NOTE — Progress Notes (Signed)
Radiation Oncology         [phone] ________________________________  Name: [name] [name] MRN: [id number]  Date: 09/26/2021  DOB: Mar 21, 1946  Follow-Up Visit Note  Outpatient  CC: [name], DO  Magrinat, [name], MD  Diagnosis and Prior Radiotherapy:    ICD-10-CM   1. Inflammatory breast cancer, right [facility])  C50.911 Ambulatory referral to Social Work    2. Malignant neoplasm of overlapping sites of right breast in female, estrogen receptor positive ([facility])  C50.811    Z17.0       CHIEF COMPLAINT: Here for follow-up and surveillance of right breast cancer  Narrative:  The patient returns today for routine follow-up.  Patient reports no Right breast pain but reports persistent moist tumor that did not change from treatment.                          ALLERGIES:  has No Known Allergies.  Meds: Current Outpatient Medications  Medication Sig Dispense Refill   amLODipine (NORVASC) 10 MG tablet TAKE 1 TABLET BY MOUTH ONCE DAILY 90 tablet 3   amLODipine (NORVASC) 10 MG tablet Take 1 tablet by mouth once a day. 90 tablet 2   atorvastatin (LIPITOR) 80 MG tablet TAKE 1 TABLET BY MOUTH ONCE DAILY 90 tablet 0   carvedilol (COREG) 3.125 MG tablet Take 1 tablet (3.125 mg total) by mouth 2 (two) times daily with a meal.     clopidogrel (PLAVIX) 75 MG tablet Take 1 tablet (75 mg total) by mouth daily. 90 tablet 3   clopidogrel (PLAVIX) 75 MG tablet TAKE 1 TABLET BY MOUTH ONCE DAILY 90 tablet 1   latanoprost (XALATAN) 0.005 % ophthalmic solution PLACE 1 DROP IN BOTH EYES EVERY EVENING 7.5 mL 3   lisinopril-hydrochlorothiazide (ZESTORETIC) 20-12.5 MG tablet Take 1 tablet by mouth daily 90 tablet 3   loratadine (CLARITIN) 10 MG tablet Take 10 mg by mouth daily as needed for allergies.     No current facility-administered medications for this encounter.    Physical Findings: The patient is in no acute distress. Patient is alert and oriented.  vitals were not taken for this visit. .     Breast (upper right) demonstrates persistent moist protruding tumor (bandage removed) Skin of chest with nodular dermal satellite lesions  Lab Findings: Lab Results  Component Value Date   WBC 6.5 07/26/2021   HGB 11.0 (L) 07/26/2021   HCT 35.2 (L) 07/26/2021   MCV 81.7 07/26/2021   PLT 208 07/26/2021    Radiographic Findings: VAS [place] LOWER EXTREMITY VENOUS (DVT)  Result Date: 09/14/2021  Lower Venous DVT Study Patient Name:  [facility]  Date of Exam:   09/14/2021 Medical Rec #: [id number]      Accession #:    [id number] Date of Birth: 03-27-1946      Patient Gender: F Patient Age:   75 years Exam Location[facility]  [facility] Procedure:      VAS [place] LOWER EXTREMITY VENOUS (DVT) Referring Phys: Lurline Del --------------------------------------------------------------------------------  Indications: Swelling.  Risk Factors: Cancer. Limitations: Poor ultrasound/tissue interface. Comparison Study: No prior studies. Performing Technologist: Oliver Hum RVT  Examination Guidelines: A complete evaluation includes B-mode imaging, spectral Doppler, color Doppler, and power Doppler as needed of all accessible portions of each vessel. Bilateral testing is considered an integral part of a complete examination. Limited examinations for reoccurring indications may be performed as noted. The reflux portion of the exam is performed with the patient  in reverse Trendelenburg.  +---------+---------------+---------+-----------+----------+--------------+ RIGHT    CompressibilityPhasicitySpontaneityPropertiesThrombus Aging +---------+---------------+---------+-----------+----------+--------------+ CFV      Full           Yes      Yes                                 +---------+---------------+---------+-----------+----------+--------------+ SFJ      Full                                                        +---------+---------------+---------+-----------+----------+--------------+ FV  Prox  Full                                                        +---------+---------------+---------+-----------+----------+--------------+ FV Mid   Full                                                        +---------+---------------+---------+-----------+----------+--------------+ FV Distal               Yes      Yes                                 +---------+---------------+---------+-----------+----------+--------------+ PFV      Full                                                        +---------+---------------+---------+-----------+----------+--------------+ POP      Full           Yes      Yes                                 +---------+---------------+---------+-----------+----------+--------------+ PTV      Full                                                        +---------+---------------+---------+-----------+----------+--------------+ PERO     Full                                                        +---------+---------------+---------+-----------+----------+--------------+   +----+---------------+---------+-----------+----------+--------------+ LEFTCompressibilityPhasicitySpontaneityPropertiesThrombus Aging +----+---------------+---------+-----------+----------+--------------+ CFV Full           Yes      Yes                                 +----+---------------+---------+-----------+----------+--------------+  Summary: RIGHT: - There is no evidence of deep vein thrombosis in the lower extremity. However, portions of this examination were limited- see technologist comments above.  - No cystic structure found in the popliteal fossa.  LEFT: - No evidence of common femoral vein obstruction.  *See table(s) above for measurements and observations. Electronically signed by Monica Martinez MD on 09/14/2021 at 12:33:59 PM.    Final     Impression/Plan:  She has recovered well from palliative RT but did not experience significant  improvement in her breast lesions. Continue wound care as is - wound/tumor appears clean/ non infected.  She is following with palliative care and I encouraged her to continue to notify them of her needs. I will see her back PRN. She is scheduled with medical oncology for add'l support.  On date of service, in total, I spent 15 minutes on this encounter. Patient was seen in person.  _____________________________________   Eppie Gibson, MD

## 2021-09-27 ENCOUNTER — Telehealth: Payer: Self-pay

## 2021-09-27 ENCOUNTER — Encounter: Payer: Self-pay | Admitting: General Practice

## 2021-09-27 NOTE — Telephone Encounter (Signed)
09/27/21 @340PM : PC SW outreached patients daughter, Tyshauna, returning her TC.  Daughter shared that patients Oncologist, Dr. Jana Hakim, placed a Hospice referral yesterday d/t progressive breast cancer after patients appointment. Daughter is in agreement with referral and shared that she did not feel an in person visit with PC team was needed at this time to discuss home care or hospice services.   Hospice AV scheduled for 11/25 with [facility] RN [employer].   Daughter appreciative of PC team support.

## 2021-09-27 NOTE — Progress Notes (Signed)
[facility] Psychosocial Distress Screening Clinical Social Work  Clinical Social Work was referred by distress screening protocol.  The patient scored a 10 on the Psychosocial Distress Thermometer which indicates severe distress. Clinical Social Worker contacted patient by phone to assess for distress and other psychosocial needs. Unable to reach patient or leave VM.  Noted patient is active w [facility] palliative care social worker is involved.  As we are unable to leave VM or contact patient, closing referral at this time.  Please reconsult if needed.    ONCBCN DISTRESS SCREENING 09/26/2021  Screening Type   Distress experienced in past week (1-10) 10  Emotional problem type Nervousness/Anxiety;Adjusting to illness;Feeling hopeless  Information Concerns Type   Physical Problem type   Referral to support programs     Clinical Social Worker follow up needed: No.  If yes, follow up plan:  Beverely Pace, [place], LCSW Clinical Social Worker Phone:  [phone]

## 2021-10-02 ENCOUNTER — Other Ambulatory Visit (HOSPITAL_COMMUNITY): Payer: Self-pay

## 2021-10-02 ENCOUNTER — Encounter: Payer: Self-pay | Admitting: Oncology

## 2021-10-02 MED ORDER — ATORVASTATIN CALCIUM 80 MG PO TABS
ORAL_TABLET | ORAL | 1 refills | Status: DC
  Filled 2021-10-02: qty 90, 90d supply, fill #0

## 2021-10-03 ENCOUNTER — Other Ambulatory Visit (HOSPITAL_COMMUNITY): Payer: Self-pay

## 2021-10-05 DIAGNOSIS — R609 Edema, unspecified: Secondary | ICD-10-CM | POA: Diagnosis not present

## 2021-10-05 DIAGNOSIS — I69351 Hemiplegia and hemiparesis following cerebral infarction affecting right dominant side: Secondary | ICD-10-CM | POA: Diagnosis not present

## 2021-10-05 DIAGNOSIS — R0600 Dyspnea, unspecified: Secondary | ICD-10-CM | POA: Diagnosis not present

## 2021-10-05 DIAGNOSIS — C50811 Malignant neoplasm of overlapping sites of right female breast: Secondary | ICD-10-CM | POA: Diagnosis not present

## 2021-10-05 DIAGNOSIS — I129 Hypertensive chronic kidney disease with stage 1 through stage 4 chronic kidney disease, or unspecified chronic kidney disease: Secondary | ICD-10-CM | POA: Diagnosis not present

## 2021-10-05 DIAGNOSIS — H409 Unspecified glaucoma: Secondary | ICD-10-CM | POA: Diagnosis not present

## 2021-10-05 DIAGNOSIS — F32A Depression, unspecified: Secondary | ICD-10-CM | POA: Diagnosis not present

## 2021-10-05 DIAGNOSIS — J302 Other seasonal allergic rhinitis: Secondary | ICD-10-CM | POA: Diagnosis not present

## 2021-10-05 DIAGNOSIS — E785 Hyperlipidemia, unspecified: Secondary | ICD-10-CM | POA: Diagnosis not present

## 2021-10-05 DIAGNOSIS — I6529 Occlusion and stenosis of unspecified carotid artery: Secondary | ICD-10-CM | POA: Diagnosis not present

## 2021-10-05 DIAGNOSIS — Z8616 Personal history of COVID-19: Secondary | ICD-10-CM | POA: Diagnosis not present

## 2021-10-05 DIAGNOSIS — Z170 Estrogen receptor positive status [ER+]: Secondary | ICD-10-CM | POA: Diagnosis not present

## 2021-10-05 DIAGNOSIS — K219 Gastro-esophageal reflux disease without esophagitis: Secondary | ICD-10-CM | POA: Diagnosis not present

## 2021-10-05 DIAGNOSIS — C7989 Secondary malignant neoplasm of other specified sites: Secondary | ICD-10-CM | POA: Diagnosis not present

## 2021-10-05 DIAGNOSIS — N189 Chronic kidney disease, unspecified: Secondary | ICD-10-CM | POA: Diagnosis not present

## 2021-10-11 DIAGNOSIS — I129 Hypertensive chronic kidney disease with stage 1 through stage 4 chronic kidney disease, or unspecified chronic kidney disease: Secondary | ICD-10-CM | POA: Diagnosis not present

## 2021-10-11 DIAGNOSIS — Z8616 Personal history of COVID-19: Secondary | ICD-10-CM | POA: Diagnosis not present

## 2021-10-11 DIAGNOSIS — Z170 Estrogen receptor positive status [ER+]: Secondary | ICD-10-CM | POA: Diagnosis not present

## 2021-10-11 DIAGNOSIS — E785 Hyperlipidemia, unspecified: Secondary | ICD-10-CM | POA: Diagnosis not present

## 2021-10-11 DIAGNOSIS — I6529 Occlusion and stenosis of unspecified carotid artery: Secondary | ICD-10-CM | POA: Diagnosis not present

## 2021-10-11 DIAGNOSIS — H409 Unspecified glaucoma: Secondary | ICD-10-CM | POA: Diagnosis not present

## 2021-10-11 DIAGNOSIS — C50811 Malignant neoplasm of overlapping sites of right female breast: Secondary | ICD-10-CM | POA: Diagnosis not present

## 2021-10-11 DIAGNOSIS — I69351 Hemiplegia and hemiparesis following cerebral infarction affecting right dominant side: Secondary | ICD-10-CM | POA: Diagnosis not present

## 2021-10-11 DIAGNOSIS — R609 Edema, unspecified: Secondary | ICD-10-CM | POA: Diagnosis not present

## 2021-10-11 DIAGNOSIS — C7989 Secondary malignant neoplasm of other specified sites: Secondary | ICD-10-CM | POA: Diagnosis not present

## 2021-10-11 DIAGNOSIS — N189 Chronic kidney disease, unspecified: Secondary | ICD-10-CM | POA: Diagnosis not present

## 2021-10-11 DIAGNOSIS — R0600 Dyspnea, unspecified: Secondary | ICD-10-CM | POA: Diagnosis not present

## 2021-10-11 DIAGNOSIS — J302 Other seasonal allergic rhinitis: Secondary | ICD-10-CM | POA: Diagnosis not present

## 2021-10-11 DIAGNOSIS — F32A Depression, unspecified: Secondary | ICD-10-CM | POA: Diagnosis not present

## 2021-10-11 DIAGNOSIS — K219 Gastro-esophageal reflux disease without esophagitis: Secondary | ICD-10-CM | POA: Diagnosis not present

## 2021-10-14 DIAGNOSIS — I6529 Occlusion and stenosis of unspecified carotid artery: Secondary | ICD-10-CM | POA: Diagnosis not present

## 2021-10-14 DIAGNOSIS — I69351 Hemiplegia and hemiparesis following cerebral infarction affecting right dominant side: Secondary | ICD-10-CM | POA: Diagnosis not present

## 2021-10-14 DIAGNOSIS — C50811 Malignant neoplasm of overlapping sites of right female breast: Secondary | ICD-10-CM | POA: Diagnosis not present

## 2021-10-14 DIAGNOSIS — C7989 Secondary malignant neoplasm of other specified sites: Secondary | ICD-10-CM | POA: Diagnosis not present

## 2021-10-14 DIAGNOSIS — R609 Edema, unspecified: Secondary | ICD-10-CM | POA: Diagnosis not present

## 2021-10-14 DIAGNOSIS — Z170 Estrogen receptor positive status [ER+]: Secondary | ICD-10-CM | POA: Diagnosis not present

## 2021-10-15 DIAGNOSIS — C50811 Malignant neoplasm of overlapping sites of right female breast: Secondary | ICD-10-CM | POA: Diagnosis not present

## 2021-10-15 DIAGNOSIS — Z170 Estrogen receptor positive status [ER+]: Secondary | ICD-10-CM | POA: Diagnosis not present

## 2021-10-15 DIAGNOSIS — I69351 Hemiplegia and hemiparesis following cerebral infarction affecting right dominant side: Secondary | ICD-10-CM | POA: Diagnosis not present

## 2021-10-15 DIAGNOSIS — I6529 Occlusion and stenosis of unspecified carotid artery: Secondary | ICD-10-CM | POA: Diagnosis not present

## 2021-10-15 DIAGNOSIS — R609 Edema, unspecified: Secondary | ICD-10-CM | POA: Diagnosis not present

## 2021-10-15 DIAGNOSIS — C7989 Secondary malignant neoplasm of other specified sites: Secondary | ICD-10-CM | POA: Diagnosis not present

## 2021-10-17 ENCOUNTER — Encounter: Payer: Self-pay | Admitting: Oncology

## 2021-10-17 NOTE — Progress Notes (Signed)
                                                                                                                                                             Patient Name: [name] [name] MRN: [id number] DOB: 1945/12/28 Referring Physician: Lurline Del (Profile Not Attached) Date of Service: 08/24/2021 [facility], [place]                                                        End Of Treatment Note  Diagnoses: C50.811-Malignant neoplasm of overlapping sites of right female breast  Cancer Staging:  Cancer Staging  Malignant neoplasm of overlapping sites of right breast in female, estrogen receptor positive ([place]) Staging form: Breast, AJCC 8th Edition - Clinical stage from 08/26/2018: Stage IIIB (cT4b, cN1, cM0, G2, ER+, PR-, HER2-) - Unsigned Histologic grading system: 3 grade system Laterality: Right Staged by: Pathologist and managing physician Stage used in treatment planning: Yes National guidelines used in treatment planning: Yes Type of national guideline used in treatment planning: NCCN  Intent: Palliative / reirradiation to gross disease  Radiation Treatment Dates: 08/20/2021 through 08/24/2021 Site Technique Total Dose (Gy) Dose per Fx (Gy) Completed Fx Beam Energies  Breast, Right: Breast_Rt 3D 20/20 4 5/5 6X, 10X   Narrative: The patient tolerated radiation therapy relatively well.   Plan: The patient will follow-up with radiation oncology in 50mo -----------------------------------  SEppie Gibson MD

## 2021-10-23 NOTE — Progress Notes (Signed)
[place]  [phone] [phone]    ID: [name] [name] DOB: 30-Jul-1946  MR#: [id number]  MBW#:[id number]  Patient Care Team: [name] Morning, DO as PCP - General (Family Medicine) [name] Sandy, MD as Consulting Physician (Vascular Surgery) [name] Berthold, NP as Nurse Practitioner (Cardiology) [name] Staggers, MD as Consulting Physician (Physical Medicine and Rehabilitation) [name] Roys, PsyD as Consulting Physician (Psychology) Magrinat, [name], MD as Consulting Physician (Oncology) [name] Kussmaul, MD as Consulting Physician (General Surgery) [name] Gibson, MD as Attending Physician (Radiation Oncology) OTHER MD:   CHIEF COMPLAINT: Inflammatory breast cancer  CURRENT TREATMENT: Best supportive care under hospice at home (pending)   INTERVAL HISTORY:  [name] [name] returns for follow-up today via telephone visit.  Her daughter [name] answered the phone call and spoke to me today.  Since her discussion with Dr. Jana [name], she states that mom has engaged with hospice and has established with them.  Since her last visit, she reports that there has not been any significant increase in her discomfort.  She thinks mom could be depressed from ongoing health issues but she is reluctant to talk to a psychologist or psychiatrist at this time.  She did not express any other concerns for me today.  She has her weekly hospice visit coming up in a day or 2.  She is not entirely sure what medications the patient is on currently but she thinks mom is taking all her medications at this time.  HISTORY OF CURRENT ILLNESS: From the original intake note:  Chronology  [name] [name] presented with right breast discoloration and redness. The right breast also had an area of hardness and nipple retraction.  She did not bring this to medical attention but her daughter did mention it when the patient had neurologic follow-up on 08/10/2018.  At that time the  nurse practitioner, [name] [name], noted right nipple inversion, hard breast, and red non-ulcerated lesions on the breast.  The patient then underwent bilateral diagnostic mammography with tomography and right breast ultrasonography at [facility] on 08/11/2018 (this is her first ever mammogram) showing: breast density category B. There was a round 2.5 cm mass at the 1 o'clock upper inner quadrant of the right breast.  By palpation this measured approximately 10 cm.  Sonographically the irregular hypoechoic mass measured 2.7 cm. There was also a hypoechoic mass in the right breast at 9 o'clock upper outer quadrant measuring 3.8 cm. There was an abnormal right axillary lymph node with cortical thickening.   Accordingly on 08/19/2018 she proceeded to biopsy of the right breast areas in question. The pathology from this procedure showed [id number]): At the 1 o'clock: invasive lobular carcinoma, grade II. Prognostic indicators significant for: estrogen receptor, 60% positive with moderate staining intensity and progesterone receptor, 0% negative. Proliferation marker Ki67 at 20%. HER2 negative with an immunohistochemistry of (1+).  At the 9 o'clock: invasive lobular carcinoma, grade II. Prognostic indicators significant for: estrogen receptor, 90% positive with strong staining intensity and progesterone receptor, 0% negative. Proliferation marker Ki67 at 30%. HER2 negative with an immunohistochemistry of (1+).   (a) CT chest/abd/pelvis 09/02/2018 and bone scan 09/10/2018 show no evidence of stage IV disease  (b) breast MRI 09/14/2018 confirms a multicentric 12.6 cm right breast mass infiltrating pectpralis but w/o associated adenopathy  (1) genetics testing to be scheduled  (2) neoadjuvant chemotherapy consisting of cyclophosphamide, methotrexate and fluorouracil every 21 days x 8, started 09/16/2018, last dose 12/18/2018  (a) CMF chemotherapy stopped after 4  cycles, with no evidence of response by MRI NOV  2019  (5) anastrozole started February 2019  (a) bone density to be obtained  (b) palbociclib added 01/19/2019 at 125 mg daily, 21/7  (c) palbociclib dose decreased to 100 mg daily 21 on 7 off, starting with second cycle, April 2020  (d) breast MRI 05/05/2019 shows slight improvement, no evidence of progression, however the tumor is not yet resectable.  Chest CT scan same day shows no evidence of metastatic disease  (e) anastrozole and palbociclib discontinued 08/12/2019 with no significant response  (6) foundation 1/PD-L1 testing:  (a) PD-L1 requested 12/18/2018--insufficient tissue  (b) foundation one testing shows the microsatellite status to be stable, and the mutational burden to be 16/Mb  (c) there are potentially actionable mutations in AKT3, TSC1, KRAS  MAPK1 and RB1  (7) started paclitaxel 08/18/2019, given days 1 and 8 of each 21-28-day cycle  (a) changed to every 14 days beginning with second cycle per patient preference  (b) discontinued after 10/27/2019 dose with no evidence of response (minimal evidence of progression)  (8) fulvestrant started 11/10/2019.  (a) discontinued after 02/02/2020 dose as no evidence of response  (9) definitive surgery considered, felt not to be possible given local extent of disease  (10) adjuvant radiation considered May 2021, patient opted against  (11) started exemestane mid May 2021   (a) started everolimus 04/17/2020--discontinued after repeated diarrhea episodes  (b) exemestane and everolimus discontinued 05/24/2020 with evidence of disease progression  (12) Palliative radiation to the right chest wall tumor 08/14/2020-08/18/2020  (13) Capecitabine 1023m daily beginning in 04/29/2021, discontinued 07/26/2021 with progression  (14) received additional palliative radiation, 5 fractions, last dose 08/24/2021  (15) opted against further life-prolonging treatment, hospice referral placed 09/26/2021.   PAST MEDICAL HISTORY: Past Medical  History:  Diagnosis Date   Anxiety    Cancer ([facility]    breast cancer- right   Carotid stenosis    Depression    situational   Dyspnea    with much ambulation   GERD (gastroesophageal reflux disease)    Hypertension    Stroke ([facility] 04/01/2017   a little weakness right side leg and hand, a little memry loss    PAST SURGICAL HISTORY: Past Surgical History:  Procedure Laterality Date   ABDOMINAL HYSTERECTOMY     ENDARTERECTOMY Left 04/08/2017   Procedure: ENDARTERECTOMY CAROTID;  Surgeon: CWaynetta Sandy MD;  [place] [facility]  Service: Vascular;  Laterality: Left;   PATCH ANGIOPLASTY Left 04/08/2017   Procedure: PATCH ANGIOPLASTY Left Carotid;  Surgeon: CWaynetta Sandy MD;  [place] [facility]  Service: Vascular;  Laterality: Left;   PORTACATH PLACEMENT Left 09/07/2018   Procedure: INSERTION PORT-A-CATH LEFT INTERNAL JUGULAR;  Surgeon: TJovita Kussmaul MD;  [place] [facility]  Service: General;  Laterality: Left;  total hysterectomy with bilateral salpingo-oophorectomy in her 89'sno hormone replacement   FAMILY HISTORY: Family History  Problem Relation Age of Onset   Cancer Mother        ovarian  The patient's father is alive at age 266 The patient's mother died at age 6389due to ovarian cancer. The patient had 3 brothers and 3 sisters. She denies a history of breast, pancreatic, colon, or other cancers in the family.    GYNECOLOGIC HISTORY:  No LMP recorded. Patient has had a hysterectomy. Menarche: Age at first live birth: 75years old She is GX P1.  She is status post total hysterectomy with BSO in her early 89's She did not  use HRT.    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. She is divorced. At home is just herself, but her two grandsons regularly come to visit and stay with her. The patient's daughter, [name] [name] works in administration for [employer]. The patient's 45 y.o. grandson is a Freight forwarder for [employer]. The  patient's 62 y.o. grandson works in [employer].    ADVANCED DIRECTIVES: Not in place.  At the 08/26/2018 visit the patient was given the appropriate documents to complete and notarized at her discretion.   HEALTH MAINTENANCE: Social History   Tobacco Use   Smoking status: Never   Smokeless tobacco: Never  Vaping Use   Vaping Use: Never used  Substance Use Topics   Alcohol use: No   Drug use: No     Colonoscopy: Never  PAP: Status post hysterectomy  Bone density: Never   No Known Allergies  Current Outpatient Medications  Medication Sig Dispense Refill   amLODipine (NORVASC) 10 MG tablet TAKE 1 TABLET BY MOUTH ONCE DAILY 90 tablet 3   amLODipine (NORVASC) 10 MG tablet Take 1 tablet by mouth once a day. 90 tablet 2   atorvastatin (LIPITOR) 80 MG tablet TAKE 1 TABLET BY MOUTH ONCE DAILY 90 tablet 0   atorvastatin (LIPITOR) 80 MG tablet Take 1 tablet by mouth once a day 90 tablet 1   carvedilol (COREG) 3.125 MG tablet Take 1 tablet (3.125 mg total) by mouth 2 (two) times daily with a meal.     clopidogrel (PLAVIX) 75 MG tablet Take 1 tablet (75 mg total) by mouth daily. 90 tablet 3   clopidogrel (PLAVIX) 75 MG tablet TAKE 1 TABLET BY MOUTH ONCE DAILY 90 tablet 1   latanoprost (XALATAN) 0.005 % ophthalmic solution PLACE 1 DROP IN BOTH EYES EVERY EVENING 7.5 mL 3   lisinopril-hydrochlorothiazide (ZESTORETIC) 20-12.5 MG tablet Take 1 tablet by mouth daily 90 tablet 3   loratadine (CLARITIN) 10 MG tablet Take 10 mg by mouth daily as needed for allergies.     No current facility-administered medications for this visit.    OBJECTIVE: African-American woman examined in a wheelchair.  There were no vitals filed for this visit.    Wt Readings from Last 3 Encounters:  09/26/21 226 lb 12.8 oz (102.9 kg)  09/14/21 221 lb 11.2 oz (100.6 kg)  05/26/21 225 lb 12 oz (102.4 kg)   There is no height or weight on file to calculate BMI.    ECOG FS:2 - Symptomatic, <50% confined to  bed  I did not do physical exam today, telephone visit.  Lower right leg 09/26/2021    Right breast 07/26/2021   Right breast 04/26/2021  Right breast 05/24/2020     Right breast 04/19/2020      Right breast 02/02/2020     Right breast 08/12/2019      LAB RESULTS:  CMP     Component Value Date/Time   NA 143 07/26/2021 1256   K 3.7 07/26/2021 1256   CL 108 07/26/2021 1256   CO2 26 07/26/2021 1256   GLUCOSE 107 (H) 07/26/2021 1256   BUN 17 07/26/2021 1256   CREATININE 1.06 (H) 07/26/2021 1256   CREATININE 1.19 (H) 04/26/2021 1347   CALCIUM 9.4 07/26/2021 1256   PROT 7.7 07/26/2021 1256   ALBUMIN 3.7 07/26/2021 1256   AST 17 07/26/2021 1256   AST 18 04/26/2021 1347   ALT 11 07/26/2021 1256   ALT 19 04/26/2021 1347  ALKPHOS 138 (H) 07/26/2021 1256   BILITOT 0.4 07/26/2021 1256   BILITOT 0.3 04/26/2021 1347   GFRNONAA 55 (L) 07/26/2021 1256   GFRNONAA 48 (L) 04/26/2021 1347   GFRAA >60 05/24/2020 1522   GFRAA 58 (L) 11/03/2018 1017    No results found for: TOTALPROTELP, ALBUMINELP, A1GS, A2GS, BETS, BETA2SER, GAMS, MSPIKE, SPEI  No results found for: KPAFRELGTCHN, LAMBDASER, KAPLAMBRATIO  Lab Results  Component Value Date   WBC 6.5 07/26/2021   NEUTROABS 3.7 07/26/2021   HGB 11.0 (L) 07/26/2021   HCT 35.2 (L) 07/26/2021   MCV 81.7 07/26/2021   PLT 208 07/26/2021    No results found for: LABCA2  No components found for: VANVBT660  No results for input(s): INR in the last 168 hours.  No results found for: LABCA2  No results found for: AYO459  No results found for: XHF414  No results found for: ELT532  Lab Results  Component Value Date   CA2729 78.6 (H) 08/18/2019    No components found for: HGQUANT  No results found for: CEA1 / No results found for: CEA1   No results found for: AFPTUMOR  No results found for: [place]  No results found for: HGBA, HGBA2QUANT, HGBFQUANT, HGBSQUAN (Hemoglobinopathy evaluation)   No  results found for: LDH  No results found for: IRON, TIBC, IRONPCTSAT (Iron and TIBC)  No results found for: FERRITIN  Urinalysis    Component Value Date/Time   COLORURINE STRAW (A) 05/23/2021 2014   APPEARANCEUR CLEAR 05/23/2021 2014   LABSPEC 1.035 (H) 05/23/2021 2014   [place] 7.0 05/23/2021 2014   [place] 05/23/2021 2014   [place] (A) 05/23/2021 2014   [place] NEGATIVE 05/23/2021 2014   [place] NEGATIVE 05/23/2021 2014   PROTEINUR NEGATIVE 05/23/2021 2014   NITRITE NEGATIVE 05/23/2021 2014   LEUKOCYTESUR TRACE (A) 05/23/2021 2014    STUDIES: No results found.   ELIGIBLE FOR AVAILABLE RESEARCH PROTOCOL: no   ASSESSMENT: 75 [place], [place] woman status post right breast biopsy of overlapping sites on 08/19/2018 for a clinical T4 N1, stage IIIB invasive lobular carcinoma, estrogen receptor positive, progesterone receptor negative, HER-2 not amplified, with an MIB-1 of 20%.  (a) CT chest/abd/pelvis 09/02/2018 and bone scan 09/10/2018 show no evidence of stage IV disease  (b) breast MRI 09/14/2018 confirms a multicentric 12.6 cm right breast mass infiltrating pectpralis but w/o associated adenopathy  (1) genetics testing to be scheduled  (2) neoadjuvant chemotherapy consisting of cyclophosphamide, methotrexate and fluorouracil every 21 days x 8, started 09/16/2018, last dose 12/18/2018  (a) CMF chemotherapy stopped after 4 cycles, with no evidence of response by MRI NOV 2019  (5) anastrozole started February 2019  (a) bone density to be obtained  (b) palbociclib added 01/19/2019 at 125 mg daily, 21/7  (c) palbociclib dose decreased to 100 mg daily 21 on 7 off, starting with second cycle, April 2020  (d) breast MRI 05/05/2019 shows slight improvement, no evidence of progression, however the tumor is not yet resectable.  Chest CT scan same day shows no evidence of metastatic disease  (e) anastrozole and palbociclib discontinued 08/12/2019 with no  significant response  (6) foundation 1/PD-L1 testing:  (a) PD-L1 requested 12/18/2018--insufficient tissue  (b) foundation one testing shows the microsatellite status to be stable, and the mutational burden to be 16/Mb  (c) there are potentially actionable mutations in AKT3, TSC1, KRAS  MAPK1 and RB1  (7) started paclitaxel 08/18/2019, given days 1 and 8 of each 21-28-day cycle  (a) changed to  every 14 days beginning with second cycle per patient preference  (b) discontinued after 10/27/2019 dose with no evidence of response (minimal evidence of progression)  (8) fulvestrant started 11/10/2019.  (a) discontinued after 02/02/2020 dose as no evidence of response  (9) definitive surgery considered, felt not to be possible given local extent of disease  (10) adjuvant radiation considered May 2021, patient opted against  (11) started exemestane mid May 2021   (a) started everolimus 04/17/2020--discontinued after repeated diarrhea episodes  (b) exemestane and everolimus discontinued 05/24/2020 with evidence of disease progression  (12) Palliative radiation to the right chest wall tumor 08/14/2020-08/18/2020  (13) Capecitabine 1063m daily beginning in 04/29/2021, discontinued 07/26/2021 with progression  (14) received additional palliative radiation, 5 fractions, last dose 08/24/2021  (15) opted against further life-prolonging treatment, hospice referral placed 09/26/2021.   PLAN: Continue hospice care, patient has progressed through multiple lines of treatment.  I have offered support and any additional assistance in taking care of this patient while she is transitioning to hospice.  Daughter expressed understanding, she was encouraged to call [place] any new questions or concerns.  Total encounter time: 12 minutes spent over the telephone talking about Patient's current condition.  I have spent an additional 30 to 45 minutes reviewing her chart and her treatment history.  I connected  with  CBriggette Najarian's daughter Ms. JThayer Headingson 10/24/21 by a telephone application and verified that I am speaking with the correct person using two identifiers.   I discussed the limitations of evaluation and management by telemedicine. The patient expressed understanding and agreed to proceed.  Maribell Demeo  10/23/21 1:37 PM  PBenay Pike MD Medical Oncology and Hematology [facility]587 Windsor LaneATesuque Pue[place] [address]Tel. [phone]   Fax. [phone]

## 2021-10-24 ENCOUNTER — Telehealth (HOSPITAL_BASED_OUTPATIENT_CLINIC_OR_DEPARTMENT_OTHER): Payer: Medicare Other | Admitting: Hematology and Oncology

## 2021-10-24 ENCOUNTER — Encounter: Payer: Self-pay | Admitting: Hematology and Oncology

## 2021-10-24 DIAGNOSIS — C50911 Malignant neoplasm of unspecified site of right female breast: Secondary | ICD-10-CM | POA: Diagnosis not present

## 2021-10-24 DIAGNOSIS — M7989 Other specified soft tissue disorders: Secondary | ICD-10-CM

## 2021-10-25 DIAGNOSIS — R609 Edema, unspecified: Secondary | ICD-10-CM | POA: Diagnosis not present

## 2021-10-25 DIAGNOSIS — Z170 Estrogen receptor positive status [ER+]: Secondary | ICD-10-CM | POA: Diagnosis not present

## 2021-10-25 DIAGNOSIS — I6529 Occlusion and stenosis of unspecified carotid artery: Secondary | ICD-10-CM | POA: Diagnosis not present

## 2021-10-25 DIAGNOSIS — C7989 Secondary malignant neoplasm of other specified sites: Secondary | ICD-10-CM | POA: Diagnosis not present

## 2021-10-25 DIAGNOSIS — C50811 Malignant neoplasm of overlapping sites of right female breast: Secondary | ICD-10-CM | POA: Diagnosis not present

## 2021-10-25 DIAGNOSIS — I69351 Hemiplegia and hemiparesis following cerebral infarction affecting right dominant side: Secondary | ICD-10-CM | POA: Diagnosis not present

## 2021-10-26 ENCOUNTER — Ambulatory Visit: Payer: Medicare Other | Admitting: Podiatry

## 2021-10-26 DIAGNOSIS — I6529 Occlusion and stenosis of unspecified carotid artery: Secondary | ICD-10-CM | POA: Diagnosis not present

## 2021-10-26 DIAGNOSIS — C7989 Secondary malignant neoplasm of other specified sites: Secondary | ICD-10-CM | POA: Diagnosis not present

## 2021-10-26 DIAGNOSIS — R609 Edema, unspecified: Secondary | ICD-10-CM | POA: Diagnosis not present

## 2021-10-26 DIAGNOSIS — Z170 Estrogen receptor positive status [ER+]: Secondary | ICD-10-CM | POA: Diagnosis not present

## 2021-10-26 DIAGNOSIS — C50811 Malignant neoplasm of overlapping sites of right female breast: Secondary | ICD-10-CM | POA: Diagnosis not present

## 2021-10-26 DIAGNOSIS — I69351 Hemiplegia and hemiparesis following cerebral infarction affecting right dominant side: Secondary | ICD-10-CM | POA: Diagnosis not present

## 2021-10-30 DIAGNOSIS — C50811 Malignant neoplasm of overlapping sites of right female breast: Secondary | ICD-10-CM | POA: Diagnosis not present

## 2021-10-30 DIAGNOSIS — R609 Edema, unspecified: Secondary | ICD-10-CM | POA: Diagnosis not present

## 2021-10-30 DIAGNOSIS — Z170 Estrogen receptor positive status [ER+]: Secondary | ICD-10-CM | POA: Diagnosis not present

## 2021-10-30 DIAGNOSIS — C7989 Secondary malignant neoplasm of other specified sites: Secondary | ICD-10-CM | POA: Diagnosis not present

## 2021-10-30 DIAGNOSIS — I6529 Occlusion and stenosis of unspecified carotid artery: Secondary | ICD-10-CM | POA: Diagnosis not present

## 2021-10-30 DIAGNOSIS — I69351 Hemiplegia and hemiparesis following cerebral infarction affecting right dominant side: Secondary | ICD-10-CM | POA: Diagnosis not present

## 2021-11-01 DIAGNOSIS — Z170 Estrogen receptor positive status [ER+]: Secondary | ICD-10-CM | POA: Diagnosis not present

## 2021-11-01 DIAGNOSIS — C50811 Malignant neoplasm of overlapping sites of right female breast: Secondary | ICD-10-CM | POA: Diagnosis not present

## 2021-11-01 DIAGNOSIS — C7989 Secondary malignant neoplasm of other specified sites: Secondary | ICD-10-CM | POA: Diagnosis not present

## 2021-11-01 DIAGNOSIS — I6529 Occlusion and stenosis of unspecified carotid artery: Secondary | ICD-10-CM | POA: Diagnosis not present

## 2021-11-01 DIAGNOSIS — R609 Edema, unspecified: Secondary | ICD-10-CM | POA: Diagnosis not present

## 2021-11-01 DIAGNOSIS — I69351 Hemiplegia and hemiparesis following cerebral infarction affecting right dominant side: Secondary | ICD-10-CM | POA: Diagnosis not present

## 2021-11-02 DIAGNOSIS — Z170 Estrogen receptor positive status [ER+]: Secondary | ICD-10-CM | POA: Diagnosis not present

## 2021-11-02 DIAGNOSIS — R609 Edema, unspecified: Secondary | ICD-10-CM | POA: Diagnosis not present

## 2021-11-02 DIAGNOSIS — I69351 Hemiplegia and hemiparesis following cerebral infarction affecting right dominant side: Secondary | ICD-10-CM | POA: Diagnosis not present

## 2021-11-02 DIAGNOSIS — C7989 Secondary malignant neoplasm of other specified sites: Secondary | ICD-10-CM | POA: Diagnosis not present

## 2021-11-02 DIAGNOSIS — I6529 Occlusion and stenosis of unspecified carotid artery: Secondary | ICD-10-CM | POA: Diagnosis not present

## 2021-11-02 DIAGNOSIS — C50811 Malignant neoplasm of overlapping sites of right female breast: Secondary | ICD-10-CM | POA: Diagnosis not present

## 2021-11-06 DIAGNOSIS — R609 Edema, unspecified: Secondary | ICD-10-CM | POA: Diagnosis not present

## 2021-11-06 DIAGNOSIS — I69351 Hemiplegia and hemiparesis following cerebral infarction affecting right dominant side: Secondary | ICD-10-CM | POA: Diagnosis not present

## 2021-11-06 DIAGNOSIS — C50811 Malignant neoplasm of overlapping sites of right female breast: Secondary | ICD-10-CM | POA: Diagnosis not present

## 2021-11-06 DIAGNOSIS — I6529 Occlusion and stenosis of unspecified carotid artery: Secondary | ICD-10-CM | POA: Diagnosis not present

## 2021-11-06 DIAGNOSIS — C7989 Secondary malignant neoplasm of other specified sites: Secondary | ICD-10-CM | POA: Diagnosis not present

## 2021-11-06 DIAGNOSIS — Z170 Estrogen receptor positive status [ER+]: Secondary | ICD-10-CM | POA: Diagnosis not present

## 2021-11-08 DIAGNOSIS — C7989 Secondary malignant neoplasm of other specified sites: Secondary | ICD-10-CM | POA: Diagnosis not present

## 2021-11-08 DIAGNOSIS — R609 Edema, unspecified: Secondary | ICD-10-CM | POA: Diagnosis not present

## 2021-11-08 DIAGNOSIS — I6529 Occlusion and stenosis of unspecified carotid artery: Secondary | ICD-10-CM | POA: Diagnosis not present

## 2021-11-08 DIAGNOSIS — C50811 Malignant neoplasm of overlapping sites of right female breast: Secondary | ICD-10-CM | POA: Diagnosis not present

## 2021-11-08 DIAGNOSIS — Z170 Estrogen receptor positive status [ER+]: Secondary | ICD-10-CM | POA: Diagnosis not present

## 2021-11-08 DIAGNOSIS — I69351 Hemiplegia and hemiparesis following cerebral infarction affecting right dominant side: Secondary | ICD-10-CM | POA: Diagnosis not present

## 2021-11-09 DIAGNOSIS — C50811 Malignant neoplasm of overlapping sites of right female breast: Secondary | ICD-10-CM | POA: Diagnosis not present

## 2021-11-09 DIAGNOSIS — Z170 Estrogen receptor positive status [ER+]: Secondary | ICD-10-CM | POA: Diagnosis not present

## 2021-11-09 DIAGNOSIS — C7989 Secondary malignant neoplasm of other specified sites: Secondary | ICD-10-CM | POA: Diagnosis not present

## 2021-11-09 DIAGNOSIS — I69351 Hemiplegia and hemiparesis following cerebral infarction affecting right dominant side: Secondary | ICD-10-CM | POA: Diagnosis not present

## 2021-11-09 DIAGNOSIS — R609 Edema, unspecified: Secondary | ICD-10-CM | POA: Diagnosis not present

## 2021-11-09 DIAGNOSIS — I6529 Occlusion and stenosis of unspecified carotid artery: Secondary | ICD-10-CM | POA: Diagnosis not present

## 2021-11-19 ENCOUNTER — Encounter: Payer: Self-pay | Admitting: Oncology

## 2021-11-19 ENCOUNTER — Other Ambulatory Visit: Payer: Self-pay

## 2021-11-19 ENCOUNTER — Emergency Department (HOSPITAL_COMMUNITY)
Admission: EM | Admit: 2021-11-19 | Discharge: 2021-11-19 | Disposition: A | Discharge status: Home / Self Care | Payer: Medicare Other | Attending: Emergency Medicine | Admitting: Emergency Medicine

## 2021-11-19 ENCOUNTER — Emergency Department (HOSPITAL_COMMUNITY): Payer: Medicare Other

## 2021-11-19 ENCOUNTER — Encounter (HOSPITAL_COMMUNITY): Payer: Self-pay | Admitting: Emergency Medicine

## 2021-11-19 DIAGNOSIS — Z5321 Procedure and treatment not carried out due to patient leaving prior to being seen by health care provider: Secondary | ICD-10-CM | POA: Insufficient documentation

## 2021-11-19 DIAGNOSIS — R000 Tachycardia, unspecified: Secondary | ICD-10-CM | POA: Diagnosis not present

## 2021-11-19 DIAGNOSIS — R2243 Localized swelling, mass and lump, lower limb, bilateral: Secondary | ICD-10-CM | POA: Diagnosis not present

## 2021-11-19 NOTE — ED Notes (Signed)
[name] is upset because it took staff 20 minutes to take her mom to the restroom in the lobby. She was told that we got to her as fast as we could.

## 2021-11-19 NOTE — ED Notes (Signed)
I attempted to collect labs and was unsuccessful. 

## 2021-11-19 NOTE — ED Provider Triage Note (Addendum)
Emergency Medicine Provider Triage Evaluation Note  [name] [name] , a 76 y.o. female  was evaluated in triage.  Pt complains of fall that occurred earlier today.  Patient reports over the past couple days she has had heaviness in her right lower extremity.  She reports prior to the fall her leg felt heavy she was unable to move it and then her right lower extremity gave out from underneath her causing fall.  Patient reports he fell backwards in a sitting position onto her buttocks.  She denies hitting her head, or loss of consciousness.  She denies chest pain, dyspnea, or palpitations.  She states she is on a blood thinner since having a stroke unsure of which ones.  Per chart review it appears to be Plavix.  She denies pain currently but does state her right lower extremity feels heavy.  She denies fever, chills, productive cough.  However she is tachycardic on exam.  Review of Systems  Positive: As above Negative: As above  Physical Exam  BP (!) 182/94 (BP Location: Left Arm)    Pulse (!) 123    Temp 98.2 F (36.8 C) (Oral)    Resp 18    SpO2 99%  Gen:   Awake, no distress   Resp:  Normal effort  MSK:   Moves extremities without difficulty  Other:  Full range of motion in right lower extremity.  Strength 5/5 the bilateral knees with extension and flexion.  Pitting edema in bilateral lower extremities.   Medical Decision Making  Medically screening exam initiated at 4:41 PM.  Appropriate orders placed.  [name] [name] was informed that the remainder of the evaluation will be completed by another provider, this initial triage assessment does not replace that evaluation, and the importance of remaining in the ED until their evaluation is complete.     Evlyn Courier, PA-C 11/19/21 1642    Evlyn Courier, PA-C 11/19/21 1643

## 2021-11-19 NOTE — ED Triage Notes (Signed)
Per EMS-states patient sipped at home-pain to right knee-patient is on blood thinners-did not hit head or loose consciousness

## 2021-11-19 NOTE — ED Notes (Signed)
[name] is taking her home. She asked for assistance from staff to put her mother in the car.

## 2021-11-20 ENCOUNTER — Telehealth: Payer: Self-pay | Admitting: *Deleted

## 2021-11-20 ENCOUNTER — Encounter: Payer: Self-pay | Admitting: Oncology

## 2021-11-20 ENCOUNTER — Other Ambulatory Visit (HOSPITAL_COMMUNITY): Payer: Self-pay

## 2021-11-20 NOTE — Telephone Encounter (Signed)
VM received from [facility]  stating pt is having bilateral 2+ pitting edema from knees down. Swelling is uncomfortable.  Daughter is asking if there is medication that could be of benefitl  Per discussion with [place] noted pt is on multiple cardic medications including lisinopril with hctz - concern is need for med review for best recommendation. Medications were prescribed by hospitalist and per [facility] pt does not have an outpt cardiologist.  This RN advised of general recommendation of elevation and compression socks.  Due to Dr [name] retirement pt does not have an attending MD presently- this RN asked Maebelle Munroe if Dr Lyman Speller could assist with situation as well as advise on how this pt needs to be medically managed.

## 2021-11-21 ENCOUNTER — Other Ambulatory Visit (HOSPITAL_COMMUNITY): Payer: Self-pay

## 2021-11-21 ENCOUNTER — Encounter: Payer: Self-pay | Admitting: Oncology

## 2021-11-21 MED ORDER — TRAMADOL HCL 50 MG PO TABS
ORAL_TABLET | ORAL | 0 refills | Status: DC
  Filled 2021-11-21: qty 30, 8d supply, fill #0

## 2021-11-21 MED ORDER — FUROSEMIDE 20 MG PO TABS
ORAL_TABLET | ORAL | 0 refills | Status: DC
  Filled 2021-11-21: qty 5, 5d supply, fill #0

## 2021-11-29 ENCOUNTER — Other Ambulatory Visit (HOSPITAL_COMMUNITY): Payer: Self-pay

## 2021-11-29 ENCOUNTER — Encounter: Payer: Self-pay | Admitting: Oncology

## 2021-11-29 MED ORDER — ASPIRIN 81 MG PO TBEC
DELAYED_RELEASE_TABLET | ORAL | 2 refills | Status: DC
  Filled 2021-11-29: qty 30, 30d supply, fill #0

## 2021-11-30 ENCOUNTER — Other Ambulatory Visit (HOSPITAL_COMMUNITY): Payer: Self-pay

## 2021-12-01 ENCOUNTER — Other Ambulatory Visit (HOSPITAL_COMMUNITY): Payer: Self-pay

## 2021-12-03 ENCOUNTER — Other Ambulatory Visit (HOSPITAL_COMMUNITY): Payer: Self-pay

## 2021-12-03 MED ORDER — PRAMOXINE HCL 1 % EX LOTN
TOPICAL_LOTION | CUTANEOUS | 2 refills | Status: DC
  Filled 2021-12-03: qty 237, 30d supply, fill #0

## 2021-12-04 ENCOUNTER — Other Ambulatory Visit (HOSPITAL_COMMUNITY): Payer: Self-pay

## 2021-12-07 ENCOUNTER — Telehealth: Payer: Self-pay

## 2021-12-07 NOTE — Telephone Encounter (Signed)
Manuela Schwartz, RN called to obtain verbal order given to continue hospice per MD.

## 2021-12-11 ENCOUNTER — Other Ambulatory Visit (HOSPITAL_COMMUNITY): Payer: Self-pay

## 2021-12-16 ENCOUNTER — Encounter (HOSPITAL_COMMUNITY): Payer: Self-pay

## 2021-12-16 ENCOUNTER — Observation Stay (HOSPITAL_COMMUNITY)

## 2021-12-16 ENCOUNTER — Other Ambulatory Visit: Payer: Self-pay

## 2021-12-16 ENCOUNTER — Encounter: Payer: Self-pay | Admitting: Oncology

## 2021-12-16 ENCOUNTER — Inpatient Hospital Stay (HOSPITAL_COMMUNITY)
Admission: EM | Admit: 2021-12-16 | Discharge: 2021-12-19 | DRG: 690 | Disposition: A | Discharge status: Skilled Nursing Facility | Attending: Internal Medicine | Admitting: Internal Medicine

## 2021-12-16 ENCOUNTER — Emergency Department (HOSPITAL_COMMUNITY)

## 2021-12-16 DIAGNOSIS — M25569 Pain in unspecified knee: Secondary | ICD-10-CM | POA: Diagnosis not present

## 2021-12-16 DIAGNOSIS — D638 Anemia in other chronic diseases classified elsewhere: Secondary | ICD-10-CM | POA: Diagnosis present

## 2021-12-16 DIAGNOSIS — W19XXXA Unspecified fall, initial encounter: Secondary | ICD-10-CM | POA: Diagnosis present

## 2021-12-16 DIAGNOSIS — Z515 Encounter for palliative care: Secondary | ICD-10-CM

## 2021-12-16 DIAGNOSIS — R531 Weakness: Secondary | ICD-10-CM | POA: Diagnosis present

## 2021-12-16 DIAGNOSIS — N390 Urinary tract infection, site not specified: Principal | ICD-10-CM | POA: Diagnosis present

## 2021-12-16 DIAGNOSIS — I69359 Hemiplegia and hemiparesis following cerebral infarction affecting unspecified side: Secondary | ICD-10-CM

## 2021-12-16 DIAGNOSIS — X501XXA Overexertion from prolonged static or awkward postures, initial encounter: Secondary | ICD-10-CM

## 2021-12-16 DIAGNOSIS — M7989 Other specified soft tissue disorders: Secondary | ICD-10-CM | POA: Diagnosis present

## 2021-12-16 DIAGNOSIS — N1831 Chronic kidney disease, stage 3a: Secondary | ICD-10-CM | POA: Diagnosis present

## 2021-12-16 DIAGNOSIS — N183 Chronic kidney disease, stage 3 unspecified: Secondary | ICD-10-CM | POA: Diagnosis present

## 2021-12-16 DIAGNOSIS — Z79899 Other long term (current) drug therapy: Secondary | ICD-10-CM

## 2021-12-16 DIAGNOSIS — E669 Obesity, unspecified: Secondary | ICD-10-CM | POA: Diagnosis present

## 2021-12-16 DIAGNOSIS — E785 Hyperlipidemia, unspecified: Secondary | ICD-10-CM | POA: Diagnosis present

## 2021-12-16 DIAGNOSIS — M25561 Pain in right knee: Secondary | ICD-10-CM | POA: Diagnosis present

## 2021-12-16 DIAGNOSIS — I10 Essential (primary) hypertension: Secondary | ICD-10-CM | POA: Diagnosis present

## 2021-12-16 DIAGNOSIS — M21371 Foot drop, right foot: Secondary | ICD-10-CM | POA: Diagnosis present

## 2021-12-16 DIAGNOSIS — Z66 Do not resuscitate: Secondary | ICD-10-CM | POA: Diagnosis present

## 2021-12-16 DIAGNOSIS — Z6833 Body mass index (BMI) 33.0-33.9, adult: Secondary | ICD-10-CM

## 2021-12-16 DIAGNOSIS — Y92002 Bathroom of unspecified non-institutional (private) residence single-family (private) house as the place of occurrence of the external cause: Secondary | ICD-10-CM

## 2021-12-16 DIAGNOSIS — Z794 Long term (current) use of insulin: Secondary | ICD-10-CM

## 2021-12-16 DIAGNOSIS — Z20822 Contact with and (suspected) exposure to covid-19: Secondary | ICD-10-CM | POA: Diagnosis present

## 2021-12-16 DIAGNOSIS — I872 Venous insufficiency (chronic) (peripheral): Secondary | ICD-10-CM | POA: Diagnosis present

## 2021-12-16 DIAGNOSIS — F32A Depression, unspecified: Secondary | ICD-10-CM | POA: Diagnosis present

## 2021-12-16 DIAGNOSIS — K219 Gastro-esophageal reflux disease without esophagitis: Secondary | ICD-10-CM | POA: Diagnosis present

## 2021-12-16 DIAGNOSIS — I69351 Hemiplegia and hemiparesis following cerebral infarction affecting right dominant side: Secondary | ICD-10-CM

## 2021-12-16 DIAGNOSIS — Z170 Estrogen receptor positive status [ER+]: Secondary | ICD-10-CM

## 2021-12-16 DIAGNOSIS — I129 Hypertensive chronic kidney disease with stage 1 through stage 4 chronic kidney disease, or unspecified chronic kidney disease: Secondary | ICD-10-CM | POA: Diagnosis present

## 2021-12-16 DIAGNOSIS — C50811 Malignant neoplasm of overlapping sites of right female breast: Secondary | ICD-10-CM | POA: Diagnosis present

## 2021-12-16 DIAGNOSIS — F419 Anxiety disorder, unspecified: Secondary | ICD-10-CM | POA: Diagnosis present

## 2021-12-16 LAB — CBC WITH DIFFERENTIAL/PLATELET
Abs Immature Granulocytes: 0.04 10*3/uL (ref 0.00–0.07)
Basophils Absolute: 0.1 10*3/uL (ref 0.0–0.1)
Basophils Relative: 1 %
Eosinophils Absolute: 0.1 10*3/uL (ref 0.0–0.5)
Eosinophils Relative: 2 %
HCT: 39.1 % (ref 36.0–46.0)
Hemoglobin: 11.8 g/dL — ABNORMAL LOW (ref 12.0–15.0)
Immature Granulocytes: 1 %
Lymphocytes Relative: 21 %
Lymphs Abs: 1.2 10*3/uL (ref 0.7–4.0)
MCH: 23.3 pg — ABNORMAL LOW (ref 26.0–34.0)
MCHC: 30.2 g/dL (ref 30.0–36.0)
MCV: 77.1 fL — ABNORMAL LOW (ref 80.0–100.0)
Monocytes Absolute: 0.6 10*3/uL (ref 0.1–1.0)
Monocytes Relative: 11 %
Neutro Abs: 3.5 10*3/uL (ref 1.7–7.7)
Neutrophils Relative %: 64 %
Platelets: 231 10*3/uL (ref 150–400)
RBC: 5.07 MIL/uL (ref 3.87–5.11)
RDW: 18.6 % — ABNORMAL HIGH (ref 11.5–15.5)
WBC: 5.5 10*3/uL (ref 4.0–10.5)
nRBC: 0 % (ref 0.0–0.2)

## 2021-12-16 LAB — URINALYSIS, MICROSCOPIC (REFLEX)

## 2021-12-16 LAB — COMPREHENSIVE METABOLIC PANEL
ALT: 13 U/L (ref 0–44)
AST: 24 U/L (ref 15–41)
Albumin: 3.2 g/dL — ABNORMAL LOW (ref 3.5–5.0)
Alkaline Phosphatase: 118 U/L (ref 38–126)
Anion gap: 8 (ref 5–15)
BUN: 10 mg/dL (ref 8–23)
CO2: 24 mmol/L (ref 22–32)
Calcium: 9 mg/dL (ref 8.9–10.3)
Chloride: 106 mmol/L (ref 98–111)
Creatinine, Ser: 1 mg/dL (ref 0.44–1.00)
GFR, Estimated: 59 mL/min — ABNORMAL LOW (ref >60)
Glucose, Bld: 126 mg/dL — ABNORMAL HIGH (ref 70–99)
Potassium: 4 mmol/L (ref 3.5–5.1)
Sodium: 138 mmol/L (ref 135–145)
Total Bilirubin: 0.5 mg/dL (ref 0.3–1.2)
Total Protein: 6.8 g/dL (ref 6.5–8.1)

## 2021-12-16 LAB — URINALYSIS, ROUTINE W REFLEX MICROSCOPIC
Bilirubin Urine: NEGATIVE
Glucose, UA: NEGATIVE mg/dL
Ketones, ur: NEGATIVE mg/dL
Nitrite: NEGATIVE
Protein, ur: NEGATIVE mg/dL
Specific Gravity, Urine: 1.01 (ref 1.005–1.030)
pH: 6 (ref 5.0–8.0)

## 2021-12-16 LAB — URIC ACID: Uric Acid, Serum: 5.7 mg/dL (ref 2.5–7.1)

## 2021-12-16 LAB — TSH: TSH: 2.913 u[IU]/mL (ref 0.350–4.500)

## 2021-12-16 LAB — RESP PANEL BY RT-PCR (FLU A&B, COVID) ARPGX2
Influenza A by PCR: NEGATIVE
Influenza B by PCR: NEGATIVE
SARS Coronavirus 2 by RT PCR: NEGATIVE

## 2021-12-16 LAB — BRAIN NATRIURETIC PEPTIDE: B Natriuretic Peptide: 11.2 pg/mL (ref 0.0–100.0)

## 2021-12-16 MED ORDER — PRAMOXINE HCL 1 % EX LOTN: TOPICAL_LOTION | Freq: Two times a day (BID) | CUTANEOUS | Status: DC | PRN

## 2021-12-16 MED ORDER — SODIUM CHLORIDE 0.9 % IV SOLN
1.0000 g | INTRAVENOUS | Status: DC
  Administered 2021-12-17 – 2021-12-18 (×2): 1 g via INTRAVENOUS
  Filled 2021-12-16 (×2): qty 10

## 2021-12-16 MED ORDER — CAMPHOR-MENTHOL 0.5-0.5 % EX LOTN
TOPICAL_LOTION | Freq: Two times a day (BID) | CUTANEOUS | Status: DC | PRN
  Filled 2021-12-16: qty 222

## 2021-12-16 MED ORDER — HYDROCHLOROTHIAZIDE 12.5 MG PO TABS
12.5000 mg | ORAL_TABLET | Freq: Every day | ORAL | Status: DC
  Administered 2021-12-16 – 2021-12-18 (×3): 12.5 mg via ORAL
  Filled 2021-12-16 (×3): qty 1

## 2021-12-16 MED ORDER — SODIUM CHLORIDE 0.9 % IV SOLN: INTRAVENOUS | Status: AC

## 2021-12-16 MED ORDER — AMLODIPINE BESYLATE 5 MG PO TABS
10.0000 mg | ORAL_TABLET | Freq: Every day | ORAL | Status: DC
  Administered 2021-12-17 – 2021-12-19 (×3): 10 mg via ORAL
  Filled 2021-12-16 (×3): qty 2

## 2021-12-16 MED ORDER — ASPIRIN EC 81 MG PO TBEC: 81.0000 mg | DELAYED_RELEASE_TABLET | Freq: Once | ORAL | Status: DC

## 2021-12-16 MED ORDER — LORATADINE 10 MG PO TABS: 10.0000 mg | ORAL_TABLET | Freq: Every day | ORAL | Status: DC | PRN

## 2021-12-16 MED ORDER — ACETAMINOPHEN 650 MG RE SUPP: 650.0000 mg | Freq: Four times a day (QID) | RECTAL | Status: DC | PRN

## 2021-12-16 MED ORDER — LISINOPRIL 20 MG PO TABS
20.0000 mg | ORAL_TABLET | Freq: Every day | ORAL | Status: DC
  Administered 2021-12-16 – 2021-12-18 (×3): 20 mg via ORAL
  Filled 2021-12-16 (×3): qty 1

## 2021-12-16 MED ORDER — ATORVASTATIN CALCIUM 40 MG PO TABS
80.0000 mg | ORAL_TABLET | Freq: Every day | ORAL | Status: DC
  Administered 2021-12-16 – 2021-12-18 (×3): 80 mg via ORAL
  Filled 2021-12-16 (×3): qty 2

## 2021-12-16 MED ORDER — SODIUM CHLORIDE 0.9% FLUSH: 3.0000 mL | INTRAVENOUS | Status: DC | PRN

## 2021-12-16 MED ORDER — LATANOPROST 0.005 % OP SOLN
1.0000 [drp] | Freq: Every day | OPHTHALMIC | Status: DC
  Administered 2021-12-16 – 2021-12-18 (×3): 1 [drp] via OPHTHALMIC
  Filled 2021-12-16: qty 2.5

## 2021-12-16 MED ORDER — LISINOPRIL-HYDROCHLOROTHIAZIDE 20-12.5 MG PO TABS: 1.0000 | ORAL_TABLET | Freq: Every day | ORAL | Status: DC

## 2021-12-16 MED ORDER — TRAMADOL HCL 50 MG PO TABS
50.0000 mg | ORAL_TABLET | Freq: Four times a day (QID) | ORAL | Status: DC | PRN
  Administered 2021-12-17 – 2021-12-18 (×2): 50 mg via ORAL
  Filled 2021-12-16 (×3): qty 1

## 2021-12-16 MED ORDER — ENOXAPARIN SODIUM 40 MG/0.4ML IJ SOSY
40.0000 mg | PREFILLED_SYRINGE | INTRAMUSCULAR | Status: DC
  Administered 2021-12-16 – 2021-12-18 (×3): 40 mg via SUBCUTANEOUS
  Filled 2021-12-16 (×3): qty 0.4

## 2021-12-16 MED ORDER — HYDRALAZINE HCL 20 MG/ML IJ SOLN: 5.0000 mg | Freq: Three times a day (TID) | INTRAMUSCULAR | Status: DC | PRN

## 2021-12-16 MED ORDER — SODIUM CHLORIDE 0.9% FLUSH
3.0000 mL | Freq: Two times a day (BID) | INTRAVENOUS | Status: DC
  Administered 2021-12-17 – 2021-12-19 (×5): 3 mL via INTRAVENOUS

## 2021-12-16 MED ORDER — SODIUM CHLORIDE 0.9 % IV SOLN
1.0000 g | Freq: Once | INTRAVENOUS | Status: AC
  Administered 2021-12-16: 1 g via INTRAVENOUS
  Filled 2021-12-16: qty 10

## 2021-12-16 MED ORDER — ACETAMINOPHEN 325 MG PO TABS: 650.0000 mg | ORAL_TABLET | Freq: Four times a day (QID) | ORAL | Status: DC | PRN

## 2021-12-16 MED ORDER — DICLOFENAC SODIUM 1 % EX GEL
2.0000 g | Freq: Four times a day (QID) | CUTANEOUS | Status: DC
  Administered 2021-12-16 – 2021-12-19 (×11): 2 g via TOPICAL
  Filled 2021-12-16: qty 100

## 2021-12-16 MED ORDER — SODIUM CHLORIDE 0.9 % IV SOLN: 250.0000 mL | INTRAVENOUS | Status: DC | PRN

## 2021-12-16 NOTE — Assessment & Plan Note (Signed)
At baseline, continue to monitor °

## 2021-12-16 NOTE — Assessment & Plan Note (Addendum)
Patient has had generalized weakness.  Could be from her malignancy.  Could also be from UTI.   B12 level was 3999.  TSH 2.91.   Patient was treated for UTI.  Seen by PT and OT.  Skilled nursing facility for short-term rehab was recommended. Daughter apparently not able to take care of her anymore because of her ambulatory dysfunction. Patient is under hospice services at home SNF placement

## 2021-12-16 NOTE — ED Notes (Signed)
Pt transported to Xray via stretcher at this time.  ?

## 2021-12-16 NOTE — ED Provider Notes (Signed)
[facility] EMERGENCY DEPARTMENT Provider Note   CSN: [id number] Arrival date & time: 12/16/21  7371     History  Chief Complaint  Patient presents with   [name] [name]    [name] [name] is a 76 y.o. female.  76 year old female with medical history as detailed who presents for evaluation.  Patient reports that this morning she tried to ambulate and her right leg was weak.  She had a fall after this.  She denies injury from the fall.  She reports a distant history of a stroke that affected her right arm and leg.  She currently lives at home with her daughter.  She has had prior episodes where her leg has become weaker and this required rehab placement.  Patient currently lives at home with her daughter.  She is able to ambulate at baseline with a walker.  Patient apparently felt fine yesterday.  This morning she was unable to bear weight on the leg secondary to her weakness.   The history is provided by the patient and medical records.  Fall This is a new problem. The current episode started 3 to 5 hours ago. The problem occurs rarely. The problem has not changed since onset.Pertinent negatives include no chest pain and no abdominal pain. Nothing aggravates the symptoms. Nothing relieves the symptoms.      Home Medications Prior to Admission medications   Medication Sig Start Date End Date Taking? Authorizing Provider  amLODipine (NORVASC) 10 MG tablet TAKE 1 TABLET BY MOUTH ONCE DAILY 03/09/20 06/07/21  [name], MD  amLODipine (NORVASC) 10 MG tablet Take 1 tablet by mouth once a day. 08/15/21     aspirin 81 MG EC tablet Take 1 tablet by mouth everyday. 11/29/21   Benay Pike, MD  atorvastatin (LIPITOR) 80 MG tablet TAKE 1 TABLET BY MOUTH ONCE DAILY 12/13/20 12/13/21  [name], MD  atorvastatin (LIPITOR) 80 MG tablet Take 1 tablet by mouth once a day 10/02/21     carvedilol (COREG) 3.125 MG tablet Take 1 tablet (3.125 mg total) by mouth 2 (two) times daily with  a meal. 05/29/21   Thurnell Lose, MD  aspirin 81 MG EC tablet Take 1 tablet by mouth daily. 11/29/21   Benay Pike, MD  furosemide (LASIX) 20 MG tablet Take 1 tablet by mouth daily for 5 days. 11/20/21     latanoprost (XALATAN) 0.005 % ophthalmic solution PLACE 1 DROP IN BOTH EYES EVERY EVENING 01/15/21 01/15/22  Bettina Gavia, OD  lisinopril-hydrochlorothiazide (ZESTORETIC) 20-12.5 MG tablet Take 1 tablet by mouth daily 04/24/21     loratadine (CLARITIN) 10 MG tablet Take 10 mg by mouth daily as needed for allergies.    [provider]  pramoxine (SARNA SENSITIVE) 1 % LOTN Apply to affected areas four times daily as needed for itching. 11/30/21     traMADol (ULTRAM) 50 MG tablet Take 1 tablet by mouth every 6 hours as needed for pain 11/21/21   Benay Pike, MD  prochlorperazine (COMPAZINE) 10 MG tablet Take 1 tablet (10 mg total) by mouth 3 (three) times daily before meals. 08/18/19 11/10/19  Magrinat, [name], MD      Allergies    Patient has no known allergies.    Review of Systems   Review of Systems  Cardiovascular:  Negative for chest pain.  Gastrointestinal:  Negative for abdominal pain.  All other systems reviewed and are negative.  Physical Exam Updated Vital Signs BP (!) 175/73    Pulse 99  Temp 98.3 F (36.8 C) (Oral)    Resp 20    Ht 5\' 9"  (1.753 m)    Wt 102.1 kg    SpO2 96%    BMI 33.23 kg/m  Physical Exam Vitals and nursing note reviewed.  Constitutional:      General: She is not in acute distress.    Appearance: Normal appearance. She is well-developed.  HENT:     Head: Normocephalic and atraumatic.  Eyes:     Conjunctiva/sclera: Conjunctivae normal.     Pupils: Pupils are equal, round, and reactive to light.  Cardiovascular:     Rate and Rhythm: Normal rate and regular rhythm.     Heart sounds: Normal heart sounds.  Pulmonary:     Effort: Pulmonary effort is normal. No respiratory distress.     Breath sounds: Normal breath sounds.  Abdominal:      General: There is no distension.     Palpations: Abdomen is soft.     Tenderness: There is no abdominal tenderness.  Musculoskeletal:        General: No deformity. Normal range of motion.     Cervical back: Normal range of motion and neck supple.  Skin:    General: Skin is warm and dry.  Neurological:     General: No focal deficit present.     Mental Status: She is alert and oriented to person, place, and time.    ED Results / Procedures / Treatments   Labs (all labs ordered are listed, but only abnormal results are displayed) Labs Reviewed  URINALYSIS, ROUTINE W REFLEX MICROSCOPIC - Abnormal; Notable for the following components:      Result Value   Hgb urine dipstick MODERATE (*)    Leukocytes,Ua LARGE (*)    All other components within normal limits  CBC WITH DIFFERENTIAL/PLATELET - Abnormal; Notable for the following components:   Hemoglobin 11.8 (*)    MCV 77.1 (*)    MCH 23.3 (*)    RDW 18.6 (*)    All other components within normal limits  COMPREHENSIVE METABOLIC PANEL - Abnormal; Notable for the following components:   Glucose, Bld 126 (*)    Albumin 3.2 (*)    GFR, Estimated 59 (*)    All other components within normal limits  URINALYSIS, MICROSCOPIC (REFLEX) - Abnormal; Notable for the following components:   Bacteria, UA RARE (*)    All other components within normal limits  RESP PANEL BY RT-PCR (FLU A&B, COVID) [id number]  URINE CULTURE    EKG None  Radiology DG Chest 2 View  Result Date: 12/16/2021 CLINICAL DATA:  Fall. EXAM: CHEST - 2 VIEW COMPARISON:  May 26, 2021 FINDINGS: A new [place] right pleural effusion with underlying opacity is identified. A left Port-A-Cath is stable. Mild streaky opacity in the left retrocardiac region on the frontal view is improved since July of 2022. The cardiomediastinal silhouette is stable. No pneumothorax. No nodules or masses. IMPRESSION: 1. [place] right pleural effusion with underlying opacity, likely atelectasis. 2.  Mild streaky opacity in left retrocardiac region is nonspecific but probably scar or atelectasis. 3. Stable left Port-A-Cath. Electronically Signed   By: Dorise Bullion III M.D.   On: 12/16/2021 10:54   DG Pelvis 1-2 Views  Result Date: 12/16/2021 CLINICAL DATA:  Fall. EXAM: PELVIS - 1-2 VIEW COMPARISON:  None. FINDINGS: There is no evidence of pelvic fracture or diastasis. No pelvic bone lesions are seen. IMPRESSION: Negative. Electronically Signed   By: Dorise Bullion III  M.D.   On: 12/16/2021 10:56    Procedures Procedures    Medications Ordered in ED Medications  cefTRIAXone (ROCEPHIN) 1 g in sodium chloride 0.9 % 100 mL IVPB (has no administration in time range)    ED Course/ Medical Decision Making/ A&P                           Medical Decision Making Amount and/or Complexity of Data Reviewed Labs: ordered. Radiology: ordered.    Medical Screen Complete  This patient presented to the ED with complaint of weakness, inability ambulate.  This complaint involves an extensive number of treatment options. The initial differential diagnosis includes, but is not limited to, viral versus bacterial infection, metabolic abnormality, etc  This presentation is: Acute, Self-Limited, Previously Undiagnosed, Uncertain Prognosis, Complicated, Systemic Symptoms, and Threat to Life/Bodily Function  Patient was reported prior history of CVA in the past.  Patient had right-sided deficits secondary to same.  Recently patient has been able to ambulate with a walker.  She has been residing at home with her daughter for  This a.m. patient was unable to ambulate.  She reports that her right leg was very weak.  Work-up is suggestive of possible early UTI  Patient would likely benefit from admission for further work-up and treatment  Hospitalist services were case and evaluate for same.  Additional history obtained:  Additional history obtained from EMS and Family External records from  outside sources obtained and reviewed including prior ED visits and prior Inpatient records.    Lab Tests:  I ordered and personally interpreted labs.  The pertinent results include: CBC, CMP, UA   Imaging Studies ordered:  I ordered imaging studies including CT head, chest x-ray I independently visualized and interpreted obtained imaging which showed no acute disease I agree with the radiologist interpretation.   Cardiac Monitoring:  The patient was maintained on a cardiac monitor.  I personally viewed and interpreted the cardiac monitor which showed an underlying rhythm of: NSR   Medicines ordered:  I ordered medication including Rocephin for suspected UTI Reevaluation of the patient after these medicines showed that the patient: improved   Problem List / ED Course:  Weakness, suspected UTI   Reevaluation:  After the interventions noted above, I reevaluated the patient and found that they have: stayed the same    Disposition:  After consideration of the diagnostic results and the patients response to treatment, I feel that the patent would benefit from admission.          Final Clinical Impression(s) / ED Diagnoses Final diagnoses:  Weakness    Rx / DC Orders ED Discharge Orders     None         Valarie Merino, MD 12/16/21 [id number]

## 2021-12-16 NOTE — ED Notes (Signed)
Portable Xray at bedside. Hospitalist at bedside assessing pt at this time.

## 2021-12-16 NOTE — Assessment & Plan Note (Signed)
Continue lipitor  ?

## 2021-12-16 NOTE — ED Triage Notes (Signed)
Pt BIB [facility] from home after ground level fall this AM. Pt reports increasing weakness in RLE from CVA in 2019. Per EMS, daughter states she is no longer able to care for pt and is interested in NH placement.

## 2021-12-16 NOTE — Assessment & Plan Note (Addendum)
Blood pressure remains elevated.  Noted to be on amlodipine hydrochlorothiazide and lisinopril.  We will increase the dose of her lisinopril.

## 2021-12-16 NOTE — Assessment & Plan Note (Addendum)
Characteristic of venous insufficiency with venous stasis dermatitis.  BNP was normal.

## 2021-12-16 NOTE — Assessment & Plan Note (Addendum)
Continue ASA, lipitor  Foot drop at baseline Pt/ot

## 2021-12-16 NOTE — H&P (Signed)
History and Physical    Patient: [name] [name] FXT:[id number] DOB: Nov 04, 1946 DOA: 12/16/2021 DOS: the patient was seen and examined on 12/16/2021 PCP: [name], DO  Patient coming from: Home - lives with her daughter. Uses walker. 3rd time this year to fall.    Chief Complaint: fall and weakness  HPI: [name] [name] is a 76 y.o. female with medical history significant of HTN, hx of CVA with mild right sided weakness, GERD, depression, CKD stage 3, anxiety and breast cancer on hospice who presented after a fall.  She was using the bathroom and when she stood up her right leg just gave out and she fell. When her daughter opened the door she was on her bottom and her legs were outstretched. She denies hitting her head. She has had 2 other incidents where her right leg gave out and she fell. Last fall was 3 weeks ago. Her daughter states she has not fully recovered well after that fall. Prior to these falls she was doing well and ambulating well. She is now trying to not put weight on the right foot or knee. She states she has pain in her right knee down with weight bearing only. No loss on sensation or tingling. She does have a right foot drop from previous stroke, but these symptoms in her knee are new.   When she fell 3 weeks ago she twisted hard down onto her right knee. She had immediate swelling and pain in her right knee. She has pain with flexion and can't flex it 90 degrees, can extent it.   Denies any fever/chills, headaches or vision changes, no chest pain or palpitations, +SOB, dry cough and orthopnea (chronic), no abdominal pain, N/V/D, no CVA tenderness, no dysuria, no frequency, +urgency. No gross hematuria.  chronic leg swelling that is worse.     ER Course:  vitals: afebrile, bp: 169/71, HR: 98, RR: 16, oxygen: 95% RA Pertinent labs: hgb 11.8 (10-11), UA: mod hgb, large LE, 21-50 WBC,  CXR: new [place] right pleural effusion with underlying opacity, likely atelectasis Mild streaky  opacity in left retrocardiac region, nonspecific but probably scare or atelectasis.  Pelvis xray: no fracture In ED: given rocephin, Pt/OT and SW consult    Review of Systems: As mentioned in the history of present illness. All other systems reviewed and are negative. Past Medical History:  Diagnosis Date   Anxiety    Cancer [facility])    breast cancer- right   Carotid stenosis    Depression    situational   Dyspnea    with much ambulation   GERD (gastroesophageal reflux disease)    Hypertension    Stroke ([place]) 04/01/2017   a little weakness right side leg and hand, a little memry loss   Past Surgical History:  Procedure Laterality Date   ABDOMINAL HYSTERECTOMY     ENDARTERECTOMY Left 04/08/2017   Procedure: ENDARTERECTOMY CAROTID;  Surgeon: Waynetta Sandy, MD;  Location: [place];  Service: Vascular;  Laterality: Left;   PATCH ANGIOPLASTY Left 04/08/2017   Procedure: PATCH ANGIOPLASTY Left Carotid;  Surgeon: Waynetta Sandy, MD;  Location: [place];  Service: Vascular;  Laterality: Left;   PORTACATH PLACEMENT Left 09/07/2018   Procedure: INSERTION PORT-A-CATH LEFT INTERNAL JUGULAR;  Surgeon: [name], MD;  Location: [place];  Service: General;  Laterality: Left;   Social History:  reports that she has never smoked. She has never used smokeless tobacco. She reports that she does not drink alcohol and does not  use drugs.  No Known Allergies  Family History  Problem Relation Age of Onset   Cancer Mother        ovarian    Prior to Admission medications   Medication Sig Start Date End Date Taking? Authorizing Provider  amLODipine (NORVASC) 10 MG tablet TAKE 1 TABLET BY MOUTH ONCE DAILY 03/09/20 06/07/21  [name], MD  amLODipine (NORVASC) 10 MG tablet Take 1 tablet by mouth once a day. 08/15/21     aspirin 81 MG EC tablet Take 1 tablet by mouth everyday. 11/29/21   Benay Pike, MD  atorvastatin (LIPITOR) 80 MG tablet TAKE 1 TABLET BY MOUTH ONCE DAILY 12/13/20  12/13/21  [name], MD  atorvastatin (LIPITOR) 80 MG tablet Take 1 tablet by mouth once a day 10/02/21     carvedilol (COREG) 3.125 MG tablet Take 1 tablet (3.125 mg total) by mouth 2 (two) times daily with a meal. 05/29/21   Thurnell Lose, MD  aspirin 81 MG EC tablet Take 1 tablet by mouth daily. 11/29/21   Benay Pike, MD  furosemide (LASIX) 20 MG tablet Take 1 tablet by mouth daily for 5 days. 11/20/21     latanoprost (XALATAN) 0.005 % ophthalmic solution PLACE 1 DROP IN BOTH EYES EVERY EVENING 01/15/21 01/15/22  Bettina Gavia, OD  lisinopril-hydrochlorothiazide (ZESTORETIC) 20-12.5 MG tablet Take 1 tablet by mouth daily 04/24/21     loratadine (CLARITIN) 10 MG tablet Take 10 mg by mouth daily as needed for allergies.    [provider]  pramoxine (SARNA SENSITIVE) 1 % LOTN Apply to affected areas four times daily as needed for itching. 11/30/21     traMADol (ULTRAM) 50 MG tablet Take 1 tablet by mouth every 6 hours as needed for pain 11/21/21   Benay Pike, MD  prochlorperazine (COMPAZINE) 10 MG tablet Take 1 tablet (10 mg total) by mouth 3 (three) times daily before meals. 08/18/19 11/10/19  Magrinat, [name], MD    Physical Exam: Vitals:   12/16/21 1530 12/16/21 1545 12/16/21 1615 12/16/21 1648  BP: (!) 176/65 (!) 187/69 (!) 184/68 (!) 177/71  Pulse: (!) 101 (!) 103 (!) 103 81  Resp: 12 16 13 15   Temp:   98.6 F (37 C) 98.6 F (37 C)  TempSrc:   Oral Oral  SpO2: 100% 100% 100% 100%  Weight:      Height:       General:  Appears calm and comfortable and is in NAD Eyes:  PERRL, EOMI, normal lids, iris ENT:  grossly normal hearing, lips & tongue, mmm; appropriate dentition Neck:  no LAD, masses or thyromegaly; no carotid bruits Cardiovascular:  RRR, no m/r/g. Bilateral LE edema, characteristic of venous insufficiency with venous stasis changes.  Respiratory:   CTA bilaterally with no wheezes/rales/rhonchi.  Normal respiratory effort. Abdomen:  soft, NT, ND,  NABS Back:   normal alignment, no CVAT Skin:  no rash or induration seen on limited exam. Fungating mass on right upper chest wall that is crusted over. Also has pink/erythematous lesions along right T5 dermatome and lesion on left upper back  Musculoskeletal:  right knee: edematous and warm, pain with anterior draw but negative sign, no pain with varus or valgus strain. Can not flex to 90 degrees, leg extends to 180 degrees.  grossly weak.  no bony abnormality Lower extremity:  Limited foot exam with no ulcerations.  2+ distal pulses. Psychiatric:  grossly normal mood and affect, speech fluent and appropriate, AOx3 Neurologic:  CN 2-12 grossly  intact, moves all extremities in coordinated fashion, sensation intact   Radiological Exams on Admission: Independently reviewed - see discussion in A/P where applicable  DG Chest 2 View  Result Date: 12/16/2021 CLINICAL DATA:  Fall. EXAM: CHEST - 2 VIEW COMPARISON:  May 26, 2021 FINDINGS: A new [place] right pleural effusion with underlying opacity is identified. A left Port-A-Cath is stable. Mild streaky opacity in the left retrocardiac region on the frontal view is improved since July of 2022. The cardiomediastinal silhouette is stable. No pneumothorax. No nodules or masses. IMPRESSION: 1. New [place] right pleural effusion with underlying opacity, likely atelectasis. 2. Mild streaky opacity in left retrocardiac region is nonspecific but probably scar or atelectasis. 3. Stable left Port-A-Cath. Electronically Signed   By: Dorise Bullion III M.D.   On: 12/16/2021 10:54   DG Pelvis 1-2 Views  Result Date: 12/16/2021 CLINICAL DATA:  Fall. EXAM: PELVIS - 1-2 VIEW COMPARISON:  None. FINDINGS: There is no evidence of pelvic fracture or diastasis. No pelvic bone lesions are seen. IMPRESSION: Negative. Electronically Signed   By: Dorise Bullion III M.D.   On: 12/16/2021 10:56   DG Knee 1-2 Views Right  Result Date: 12/16/2021 CLINICAL DATA:  Status post fall.   Right knee pain. EXAM: RIGHT KNEE - 1-2 VIEW COMPARISON:  None. FINDINGS: No acute fracture or dislocation. No aggressive osseous lesion. Normal alignment. No significant joint effusion. Generalized soft tissue edema in the subcutaneous fat around the knee. Peripheral vascular atherosclerotic disease. No radiopaque foreign body or soft tissue emphysema. IMPRESSION: 1. No acute osseous injury of the right knee. Electronically Signed   By: Kathreen Devoid M.D.   On: 12/16/2021 16:16    EKG: Independently reviewed.  Sinus tachycardia with rate 103; nonspecific ST changes with no evidence of acute ischemia   Labs on Admission: I have personally reviewed the available labs and imaging studies at the time of the admission.  Pertinent labs:   1.8 (10-11),  UA: mod hgb, large LE, 21-50 WBC,    Assessment and Plan: * Weakness 76 year old female with breast cancer on hospice for supportive tx found to have 3 week history of weakness and falls. History consistent with right leg giving out and pain in right knee since fall 3 weeks ago.  -place in obs -UA suspicious for UTI, continue rocephin and culture pending -concern for sequelae of cancer-already on hospice -right knee could be contributing and exam concerning for meniscal tear -check tsh/b12 -pt/ot to eval. SW consulted as daughter can not take care of her if she is not ambulating and helping with ADLs -gentle IVF   Right knee pain Right  Knee pain after a fall about 3 weeks ago where she fell onto her twisted knee and has had pain since that time.  Knee exam concerning for meniscal tear Xray with no acute findings or severe OA by my read Discussed with daughter MRI, but she is not a surgical candidate so this would not change management at this point Will try voltaren gel and ice PT to eval. End goal to prevent falls  Leg swelling- (present on admission) characteristic of venous insufficiency with venous stasis dermatitis Daughter has some  concerns for CHF. She does have history  Of worsening edema, chemo, cough, dyspnea on exertion and cough so discussed will check BNP-wnl, reassured daughter likely venous insufficiency  TED hose    Essential hypertension- (present on admission) Hypertensive today, but has not had her medication Resume zestoretic and norvasc  Prn hydralazine   Malignant neoplasm of overlapping sites of right breast in female, estrogen receptor positive ([place]) Currently on hospice for supportive tx Per last oncology note: discontinued her capecitabine at her last visit on 07/26/2021 due to progression in her chest wall tumor. palliative radiation therapy to the area. She subsequently received 5 fractions of treatment from 10/10/202 through 08/24/2021 .opted against further life-prolonging treatment, hospice referral placed 09/26/2021.  Hemiparesis affecting dominant side as late effect of stroke (HCC) Continue ASA, lipitor  Foot drop at baseline Pt/ot   CKD (chronic kidney disease) stage 3, GFR 30-59 ml/min (HCC)- (present on admission) At baseline, continue to monitor   Hyperlipidemia- (present on admission) Continue lipitor     Advance Care Planning:   Code Status: DNR   Consults: sw/pt/ot  DVT Prophylaxis: lovenox  Family Communication: daughter at bedside: Radio broadcast assistant   Severity of Illness: The appropriate patient status for this patient is OBSERVATION. Observation status is judged to be reasonable and necessary in order to provide the required intensity of service to ensure the patient's safety. The patient's presenting symptoms, physical exam findings, and initial radiographic and laboratory data in the context of their medical condition is felt to place them at decreased risk for further clinical deterioration. Furthermore, it is anticipated that the patient will be medically stable for discharge from the hospital within 2 midnights of admission.   Author: Orma Flaming, MD 12/16/2021 6:15  PM  For on call review [email or web address].

## 2021-12-16 NOTE — Evaluation (Addendum)
Physical Therapy Evaluation Patient Details Name: [name] [name] MRN: [id number] DOB: 1946/07/07 Today's Date: 12/16/2021  History of Present Illness  Pt is a 76 y.o. female who presented 12/16/21 s/p fall. Imaging of pelvis and R knee negative. Per verbal reports from RN in ED 2/5, pt with UTI. PMH: residual R weakness prior CVA, anxiety, cancer, carotid stenosis, depression, dyspnea, HTN, GERD   Clinical Impression  Pt presents with condition above and deficits mentioned below, see PT Problem List. PTA, she was living with her daughter in ground-level apartment, requiring assistance for transfers, bathing, dressing, and medication and financial management. Pt has had multiple recent falls, per daughter, and has some memory deficits at baseline. Currently, pt is reporting a desire/goal to get stronger, move better, and fall less, agreeable to PT intervention. Pt is requiring mod-maxA for bed mobility and modA for transfers and to take a few steps at EOB with a RW this date. Pt with limited weight bearing tolerance on R knee this date due to pain, but radiographs negative. She displays deficits in activity tolerance, strength (R weaker than L from prior CVA), and balance and is at high risk for subsequent falls. Daughter reporting she cannot provide the level of assistance pt currently needs. Discussed rehab options with pt and daughter, and both are in agreement for SNF. Will continue to follow acutely.       Recommendations for follow up therapy are one component of a multi-disciplinary discharge planning process, led by the attending physician.  Recommendations may be updated based on patient status, additional functional criteria and insurance authorization.  Follow Up Recommendations Skilled nursing-short term rehab (<3 hours/day)    Assistance Recommended at Discharge Intermittent Supervision/Assistance  Patient can return home with the following  A lot of help with walking and/or transfers;A lot  of help with bathing/dressing/bathroom;Assistance with cooking/housework;Direct supervision/assist for medications management;Direct supervision/assist for financial management;Assist for transportation;Help with stairs or ramp for entrance    Equipment Recommendations None recommended by PT  Recommendations for Other Services  OT consult    Functional Status Assessment Patient has had a recent decline in their functional status and demonstrates the ability to make significant improvements in function in a reasonable and predictable amount of time.     Precautions / Restrictions Precautions Precautions: Fall Restrictions Weight Bearing Restrictions: No      Mobility  Bed Mobility Overal bed mobility: Needs Assistance Bed Mobility: Sit to Supine, Supine to Sit     Supine to sit: Mod assist, HOB elevated Sit to supine: Max assist   General bed mobility comments: Cues to bring legs off edge of stretcher, needing assistance at R leg, modA to ascend trunk and scoot hips to edge. MaxA to manage trunk and legs back to supine.    Transfers Overall transfer level: Needs assistance Equipment used: Rolling walker (2 wheels) Transfers: Sit to/from Stand Sit to Stand: Mod assist, From elevated surface           General transfer comment: Cues to push up from stretcher with L hand, modA to power up and transition weight anteriorly, extra time to complete.    Ambulation/Gait Ambulation/Gait assistance: Mod assist Gait Distance (Feet): 3 Feet Assistive device: Rolling walker (2 wheels) Gait Pattern/deviations: Step-to pattern, Decreased stance time - right, Decreased stride length, Decreased dorsiflexion - right, Decreased dorsiflexion - left, Decreased weight shift to right, Trunk flexed, Antalgic, Shuffle Gait velocity: reduced Gait velocity interpretation: <1.31 ft/sec, indicative of household ambulator   General Gait Details: Pt  with very slow, antalgic gait pattern, displaying  difficulty shifting weight to R leg due to pain. Very Stary, shuffling steps anterior <> posterior and laterally to R at edge of stretcher before pt fatigued. ModA to balance and manage RW and provide cues.  Stairs            Wheelchair Mobility    Modified Rankin (Stroke Patients Only)       Balance Overall balance assessment: Needs assistance Sitting-balance support: No upper extremity supported, Feet supported Sitting balance-Leahy Scale: Fair Sitting balance - Comments: Sits EOB statically with supervision   Standing balance support: Bilateral upper extremity supported, Reliant on assistive device for balance, During functional activity Standing balance-Leahy Scale: Poor Standing balance comment: Reliant on RW and min-modA for standing                             Pertinent Vitals/Pain Pain Assessment Pain Assessment: 0-10 Pain Score: 4  Pain Location: R knee Pain Descriptors / Indicators: Aching Pain Intervention(s): Limited activity within patient's tolerance, Monitored during session, Repositioned    Home Living Family/patient expects to be discharged to:: Private residence Living Arrangements: Children Available Help at Discharge: Family;Available 24 hours/day;Personal care attendant Type of Home: Apartment Home Access: Level entry       Home Layout: One level Home Equipment: Tub bench;Rolling Walker (2 wheels);BSC/3in1;Wheelchair - manual (adjustable bed) Additional Comments: Nurse aide comes 2x/week for ~1 hr each time to help bathe and dress pt. Hospice is partially involved    Prior Function Prior Level of Function : Needs assist       Physical Assist : Mobility (physical);ADLs (physical) Mobility (physical): Transfers;Stairs ADLs (physical): Dressing;Bathing;IADLs Mobility Comments: Daughter assists in pulling pt up to stand for transfers and assists in keeping RW on ground as she tends to pull up on it. Several falls in past year. Able  to walk with RW without assistance. ADLs Comments: Aide assists in sponge baths and dressing. Daughter manages finances and meds.     Hand Dominance   Dominant Hand: Right    Extremity/Trunk Assessment   Upper Extremity Assessment Upper Extremity Assessment: Defer to OT evaluation    Lower Extremity Assessment Lower Extremity Assessment: RLE deficits/detail;LLE deficits/detail RLE Deficits / Details: MMT scores of 4- in quads, residual weakness from prior CVA; reports tingling sensation with touch inferior to knee; pitting edema noted with R with more edema than L; noted erythema and flaking of skin on plantar surface of heel, daughter reports pt drags her feet when she walks causing this RLE Sensation: decreased light touch LLE Deficits / Details: MMT scores of 4+ in quads; sensation intact; noted erythema and flaking of skin on plantar surface of heel, daughter reports pt drags her feet when she walks causing this LLE Sensation: WNL    Cervical / Trunk Assessment Cervical / Trunk Assessment: Other exceptions Cervical / Trunk Exceptions: increased body habitus  Communication   Communication: No difficulties  Cognition Arousal/Alertness: Awake/alert Behavior During Therapy: WFL for tasks assessed/performed Overall Cognitive Status: History of cognitive impairments - at baseline                                 General Comments: Some mild memory issues, does not always know the date and needs help managing meds and finances baseline.        General Comments General comments (skin integrity,  edema, etc.): edcuated pt and daughter on rehab options, with them agreeing to SNF; educated them on floating heels    Exercises     Assessment/Plan    PT Assessment Patient needs continued PT services  PT Problem List Decreased strength;Decreased range of motion;Decreased activity tolerance;Decreased balance;Decreased mobility;Decreased cognition;Decreased knowledge of use  of DME;Impaired sensation;Obesity;Decreased skin integrity;Pain       PT Treatment Interventions DME instruction;Gait training;Functional mobility training;Therapeutic exercise;Therapeutic activities;Balance training;Neuromuscular re-education;Cognitive remediation;Patient/family education    PT Goals (Current goals can be found in the Care Plan section)  Acute Rehab PT Goals Patient Stated Goal: to get stronger, fall less, and move better PT Goal Formulation: With patient/family Time For Goal Achievement: 12/30/21 Potential to Achieve Goals: Good    Frequency Min 2X/week     Co-evaluation               AM-PAC PT "6 Clicks" Mobility  Outcome Measure Help needed turning from your back to your side while in a flat bed without using bedrails?: A Lot Help needed moving from lying on your back to sitting on the side of a flat bed without using bedrails?: A Lot Help needed moving to and from a bed to a chair (including a wheelchair)?: A Lot Help needed standing up from a chair using your arms (e.g., wheelchair or bedside chair)?: A Lot Help needed to walk in hospital room?: Total Help needed climbing 3-5 steps with a railing? : Total 6 Click Score: 10    End of Session Equipment Utilized During Treatment: Gait belt Activity Tolerance: Patient limited by fatigue Patient left: in bed;with nursing/sitter in room;with family/visitor present Nurse Communication: Mobility status;Other (comment) (call button not near her yet) PT Visit Diagnosis: Unsteadiness on feet (R26.81);Other abnormalities of gait and mobility (R26.89);Muscle weakness (generalized) (M62.81);History of falling (Z91.81);Repeated falls (R29.6);Difficulty in walking, not elsewhere classified (R26.2);Pain Pain - Right/Left: Right Pain - part of body: Knee    Time: 7588-3254 PT Time Calculation (min) (ACUTE ONLY): 49 min   Charges:   PT Evaluation $PT Eval Moderate Complexity: 1 Mod PT Treatments $Therapeutic  Activity: 23-37 mins        Moishe Spice, PT, DPT Acute Rehabilitation Services  Pager: [phone] Office: [phone]   Orvan Falconer 12/16/2021, 3:31 PM

## 2021-12-16 NOTE — Assessment & Plan Note (Addendum)
Right  Knee pain after a fall about 3 weeks prior to admission where she fell onto her twisted knee and has had pain since that time.  Concern was for meniscal injury.  Manage conservatively for now.

## 2021-12-16 NOTE — Assessment & Plan Note (Addendum)
Under hospice care at home. 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 palliative radiation therapy to the area. She subsequently received 5 fractions of treatment from 10/10/202 through 08/24/2021.

## 2021-12-16 NOTE — ED Notes (Signed)
Pt daughter at bedside is concerned that patient has CHF and is requesting to be tested for same while she is here. Secure chat sent to admitting MD regarding same.

## 2021-12-16 NOTE — ED Notes (Signed)
PT at bedside working with patient at this time. Pt cleaned up of urinary incontinence. Linens changed. Fresh warm blankets provided.

## 2021-12-17 DIAGNOSIS — E669 Obesity, unspecified: Secondary | ICD-10-CM | POA: Diagnosis present

## 2021-12-17 DIAGNOSIS — N1831 Chronic kidney disease, stage 3a: Secondary | ICD-10-CM

## 2021-12-17 DIAGNOSIS — D638 Anemia in other chronic diseases classified elsewhere: Secondary | ICD-10-CM

## 2021-12-17 DIAGNOSIS — Z6833 Body mass index (BMI) 33.0-33.9, adult: Secondary | ICD-10-CM | POA: Diagnosis not present

## 2021-12-17 DIAGNOSIS — Z20822 Contact with and (suspected) exposure to covid-19: Secondary | ICD-10-CM | POA: Diagnosis present

## 2021-12-17 DIAGNOSIS — W19XXXA Unspecified fall, initial encounter: Secondary | ICD-10-CM | POA: Diagnosis present

## 2021-12-17 DIAGNOSIS — N390 Urinary tract infection, site not specified: Secondary | ICD-10-CM | POA: Diagnosis present

## 2021-12-17 DIAGNOSIS — M25561 Pain in right knee: Secondary | ICD-10-CM | POA: Diagnosis present

## 2021-12-17 DIAGNOSIS — I69351 Hemiplegia and hemiparesis following cerebral infarction affecting right dominant side: Secondary | ICD-10-CM | POA: Diagnosis not present

## 2021-12-17 DIAGNOSIS — I10 Essential (primary) hypertension: Secondary | ICD-10-CM | POA: Diagnosis not present

## 2021-12-17 DIAGNOSIS — C50811 Malignant neoplasm of overlapping sites of right female breast: Secondary | ICD-10-CM | POA: Diagnosis present

## 2021-12-17 DIAGNOSIS — Z794 Long term (current) use of insulin: Secondary | ICD-10-CM | POA: Diagnosis not present

## 2021-12-17 DIAGNOSIS — N3000 Acute cystitis without hematuria: Secondary | ICD-10-CM | POA: Diagnosis not present

## 2021-12-17 DIAGNOSIS — Z66 Do not resuscitate: Secondary | ICD-10-CM | POA: Diagnosis present

## 2021-12-17 DIAGNOSIS — Z515 Encounter for palliative care: Secondary | ICD-10-CM | POA: Diagnosis not present

## 2021-12-17 DIAGNOSIS — F32A Depression, unspecified: Secondary | ICD-10-CM | POA: Diagnosis present

## 2021-12-17 DIAGNOSIS — M21371 Foot drop, right foot: Secondary | ICD-10-CM | POA: Diagnosis present

## 2021-12-17 DIAGNOSIS — Z79899 Other long term (current) drug therapy: Secondary | ICD-10-CM | POA: Diagnosis not present

## 2021-12-17 DIAGNOSIS — I872 Venous insufficiency (chronic) (peripheral): Secondary | ICD-10-CM | POA: Diagnosis present

## 2021-12-17 DIAGNOSIS — X501XXA Overexertion from prolonged static or awkward postures, initial encounter: Secondary | ICD-10-CM | POA: Diagnosis not present

## 2021-12-17 DIAGNOSIS — I129 Hypertensive chronic kidney disease with stage 1 through stage 4 chronic kidney disease, or unspecified chronic kidney disease: Secondary | ICD-10-CM | POA: Diagnosis present

## 2021-12-17 DIAGNOSIS — M25569 Pain in unspecified knee: Secondary | ICD-10-CM | POA: Diagnosis present

## 2021-12-17 DIAGNOSIS — Z170 Estrogen receptor positive status [ER+]: Secondary | ICD-10-CM | POA: Diagnosis not present

## 2021-12-17 DIAGNOSIS — F419 Anxiety disorder, unspecified: Secondary | ICD-10-CM | POA: Diagnosis present

## 2021-12-17 DIAGNOSIS — M7989 Other specified soft tissue disorders: Secondary | ICD-10-CM | POA: Diagnosis present

## 2021-12-17 DIAGNOSIS — E785 Hyperlipidemia, unspecified: Secondary | ICD-10-CM | POA: Diagnosis present

## 2021-12-17 DIAGNOSIS — K219 Gastro-esophageal reflux disease without esophagitis: Secondary | ICD-10-CM | POA: Diagnosis present

## 2021-12-17 DIAGNOSIS — Y92002 Bathroom of unspecified non-institutional (private) residence single-family (private) house as the place of occurrence of the external cause: Secondary | ICD-10-CM | POA: Diagnosis not present

## 2021-12-17 DIAGNOSIS — R531 Weakness: Secondary | ICD-10-CM | POA: Diagnosis present

## 2021-12-17 LAB — BASIC METABOLIC PANEL
Anion gap: 10 (ref 5–15)
BUN: 9 mg/dL (ref 8–23)
CO2: 23 mmol/L (ref 22–32)
Calcium: 8.5 mg/dL — ABNORMAL LOW (ref 8.9–10.3)
Chloride: 104 mmol/L (ref 98–111)
Creatinine, Ser: 1 mg/dL (ref 0.44–1.00)
GFR, Estimated: 59 mL/min — ABNORMAL LOW (ref >60)
Glucose, Bld: 115 mg/dL — ABNORMAL HIGH (ref 70–99)
Potassium: 3.8 mmol/L (ref 3.5–5.1)
Sodium: 137 mmol/L (ref 135–145)

## 2021-12-17 LAB — CBC
HCT: 33 % — ABNORMAL LOW (ref 36.0–46.0)
Hemoglobin: 10 g/dL — ABNORMAL LOW (ref 12.0–15.0)
MCH: 23 pg — ABNORMAL LOW (ref 26.0–34.0)
MCHC: 30.3 g/dL (ref 30.0–36.0)
MCV: 75.9 fL — ABNORMAL LOW (ref 80.0–100.0)
Platelets: 223 10*3/uL (ref 150–400)
RBC: 4.35 MIL/uL (ref 3.87–5.11)
RDW: 18.4 % — ABNORMAL HIGH (ref 11.5–15.5)
WBC: 6.1 10*3/uL (ref 4.0–10.5)
nRBC: 0 % (ref 0.0–0.2)

## 2021-12-17 LAB — URINE CULTURE

## 2021-12-17 LAB — VITAMIN B12: Vitamin B-12: 3999 pg/mL — ABNORMAL HIGH (ref 180–914)

## 2021-12-17 NOTE — NC FL2 (Signed)
[place] LEVEL OF CARE SCREENING TOOL     IDENTIFICATION  Patient Name: [name] [name] Birthdate: 09/04/1946 Sex: female Admission Date (Current Location): 12/16/2021  [facility] and [place] Number:  Herbalist and Address:  The [place], [place] [address] [place], [place] [address]      Provider Number: [id number]  Attending Physician Name and Address:  Bonnielee Haff, MD  Relative Name and Phone Number:  Elita Quick Daughter [phone]    Current Level of Care: Hospital Recommended Level of Care: [place] Prior Approval Number:    Date Approved/Denied:   PASRR Number: [id number]  Discharge Plan: SNF    Current Diagnoses: Patient Active Problem List   Diagnosis Date Noted   UTI (urinary tract infection) 12/17/2021   Anemia of chronic disease 12/17/2021   Right knee pain 12/16/2021   Leg swelling 12/16/2021   Obesity (BMI 30-39.9) 05/25/2021   Weakness 05/24/2021   CKD (chronic kidney disease) stage 3, GFR 30-59 ml/min ([place]) 05/24/2021   Pain due to onychomycosis of toenails of both feet 05/07/2019   [place] durum 05/07/2019   Coagulation disorder ([place]) 05/07/2019   Multiple lung nodules 01/11/2019   Port-A-Cath in place 10/09/2018   Malignant neoplasm of overlapping sites of right breast in female, estrogen receptor positive ([place]) 08/24/2018   Glucose intolerance (impaired glucose tolerance) 05/01/2017   Acute bilat watershed infarction [facility]) 04/09/2017   Intracranial vascular stenosis    Hemiparesis affecting dominant side as late effect of stroke (HCC)    S/P carotid endarterectomy    Essential hypertension    Stenosis of left carotid artery    Hyperlipidemia    Gait disturbance, post-stroke 04/02/2017   Right arm weakness    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 ([place]) 04/01/2017    Orientation RESPIRATION BLADDER Height & Weight     Self, Time, Situation,  Place  Normal Incontinent, External catheter Weight: 225 lb (102.1 kg) Height:  5\' 9"  (175.3 cm)  BEHAVIORAL SYMPTOMS/MOOD NEUROLOGICAL BOWEL NUTRITION STATUS      Continent Diet (see discharge summary)  AMBULATORY STATUS COMMUNICATION OF NEEDS Skin   Limited Assist Verbally Other (Comment) (sores-upper torso, CA related)                       Personal Care Assistance Level of Assistance  Bathing, Feeding, Dressing Bathing Assistance: Maximum assistance Feeding assistance: Limited assistance Dressing Assistance: Maximum assistance     Functional Limitations Info  Sight, Hearing, Speech Sight Info: Adequate Hearing Info: Adequate Speech Info: Adequate    SPECIAL CARE FACTORS FREQUENCY  PT (By licensed PT), OT (By licensed OT)     PT Frequency: 5x week OT Frequency: 5x week            Contractures Contractures Info: Not present    Additional Factors Info  Code Status, Allergies Code Status Info: DNR Allergies Info: NKA           Current Medications (12/17/2021):  This is the current hospital active medication list Current Facility-Administered Medications  Medication Dose Route Frequency Provider Last Rate Last Admin   0.9 %  sodium chloride infusion  250 mL Intravenous PRN Orma Flaming, MD       acetaminophen (TYLENOL) tablet 650 mg  650 mg Oral Q6H PRN Orma Flaming, MD       Or   acetaminophen (TYLENOL) suppository 650 mg  650 mg Rectal Q6H PRN Orma Flaming, MD  amLODipine (NORVASC) tablet 10 mg  10 mg Oral Daily Orma Flaming, MD   10 mg at 12/17/21 1025   aspirin EC tablet 81 mg  81 mg Oral Once Orma Flaming, MD       atorvastatin (LIPITOR) tablet 80 mg  80 mg Oral QHS Orma Flaming, MD   80 mg at 12/16/21 2117   camphor-menthol (SARNA) lotion   Topical BID PRN Joselyn Glassman A, RPH       cefTRIAXone (ROCEPHIN) 1 g in sodium chloride 0.9 % 100 mL IVPB  1 g Intravenous Q24H Orma Flaming, MD 200 mL/hr at 12/17/21 0839 1 g at 12/17/21 8527    diclofenac Sodium (VOLTAREN) 1 % topical gel 2 g  2 g Topical QID Orma Flaming, MD   2 g at 12/17/21 0839   enoxaparin (LOVENOX) injection 40 mg  40 mg Subcutaneous Q24H Orma Flaming, MD   40 mg at 12/16/21 2117   hydrALAZINE (APRESOLINE) injection 5 mg  5 mg Intravenous Q8H PRN Orma Flaming, MD       lisinopril (ZESTRIL) tablet 20 mg  20 mg Oral Daily Joselyn Glassman A, RPH   20 mg at 12/17/21 7824   And   hydrochlorothiazide (HYDRODIURIL) tablet 12.5 mg  12.5 mg Oral Daily Joselyn Glassman A, RPH   12.5 mg at 12/17/21 0835   latanoprost (XALATAN) 0.005 % ophthalmic solution 1 drop  1 drop Both Eyes QHS Orma Flaming, MD   1 drop at 12/16/21 2117   loratadine (CLARITIN) tablet 10 mg  10 mg Oral Daily PRN Orma Flaming, MD       sodium chloride flush (NS) 0.9 % injection 3 mL  3 mL Intravenous Q12H Orma Flaming, MD   3 mL at 12/17/21 0837   sodium chloride flush (NS) 0.9 % injection 3 mL  3 mL Intravenous PRN Orma Flaming, MD       traMADol Veatrice Bourbon) tablet 50 mg  50 mg Oral Q6H PRN Orma Flaming, MD   50 mg at 12/17/21 2353     Discharge Medications: Please see discharge summary for a list of discharge medications.  Relevant Imaging Results:  Relevant Lab Results:   Additional Information SSN: [id number]. Pfizer COVID-19 Vaccine 04/11/2020 , 03/21/2020.  Joanne Chars, LCSW

## 2021-12-17 NOTE — TOC Initial Note (Signed)
Transition of Care [facility]) - Initial/Assessment Note    Patient Details  Name: [name] [name] MRN: [id number] Date of Birth: Sep 01, 1946  [facility] CM/SW Contact:    Joanne Chars, LCSW Phone Number: 12/17/2021, 11:35 AM  Clinical Narrative:    CSW met with pt regarding DC recommendation for SNF.  Pt is active [facility] pt but does want to pursue SNF at this time.  Choice document given, pt has been to [facility] before and would be open to return there if possible.  Permission given to speak with daughter [name].  Pt is vaccinated for covid but not boosted.  CSW left message with daughter [name] to confirm plan.  CSW spoke with [place] at [facility] and informed her of plan for SNF.                Expected Discharge Plan: Skilled Nursing Facility Barriers to Discharge: Continued Medical Work up, SNF Pending bed offer   Patient Goals and CMS Choice Patient states their goals for this hospitalization and ongoing recovery are:: "get strength back" CMS Medicare.gov Compare Post Acute Care list provided to:: Patient Choice offered to / list presented to : Patient  Expected Discharge Plan and Services Expected Discharge Plan: [place] In-house Referral: Clinical Social Work   Post Acute Care Choice: [place] Living arrangements for the past 2 months: [place]                                      Prior Living Arrangements/Services Living arrangements for the past 2 months: Single Family Home Lives with:: Adult Children (daughter [name]) Patient language and need for interpreter reviewed:: Yes Do you feel safe going back to the place where you live?: Yes      Need for Family Participation in Patient Care: Yes (Comment) Care giver support system in place?: Yes (comment) Current home services: Hospice ([facility]) Criminal Activity/Legal Involvement Pertinent to Current Situation/Hospitalization: No -  Comment as needed  Activities of Daily Living Home Assistive Devices/Equipment: Environmental consultant (specify type), Wheelchair ADL Screening (condition at time of admission) Patient's cognitive ability adequate to safely complete daily activities?: Yes Is the patient deaf or have difficulty hearing?: No Does the patient have difficulty seeing, even when wearing glasses/contacts?: No Does the patient have difficulty concentrating, remembering, or making decisions?: No Patient able to express need for assistance with ADLs?: Yes Does the patient have difficulty dressing or bathing?: Yes Independently performs ADLs?: No Does the patient have difficulty walking or climbing stairs?: Yes Weakness of Legs: Both Weakness of Arms/Hands: Both  Permission Sought/Granted Permission sought to share information with : Family Supports Permission granted to share information with : Yes, Verbal Permission Granted  Share Information with NAME: daughter [name]  Permission granted to share info w AGENCY: SNF        Emotional Assessment Appearance:: Appears stated age Attitude/Demeanor/Rapport: Engaged Affect (typically observed): Appropriate, Pleasant Orientation: :  (not recorded, appears oriented) Alcohol / Substance Use: Not Applicable Psych Involvement: No (comment)  Admission diagnosis:  Knee pain [M25.569] Weakness [R53.1] UTI (urinary tract infection) [N39.0] Patient Active Problem List   Diagnosis Date Noted   UTI (urinary tract infection) 12/17/2021   Anemia of chronic disease 12/17/2021   Right knee pain 12/16/2021   Leg swelling 12/16/2021   Obesity (BMI 30-39.9) 05/25/2021   Weakness 05/24/2021   CKD (chronic kidney disease) stage  3, GFR 30-59 ml/min (HCC) 05/24/2021   Pain due to onychomycosis of toenails of both feet 05/07/2019   [place] durum 05/07/2019   Coagulation disorder ([place]) 05/07/2019   Multiple lung nodules 01/11/2019   Port-A-Cath in place 10/09/2018   Malignant neoplasm of  overlapping sites of right breast in female, estrogen receptor positive ([place]) 08/24/2018   Glucose intolerance (impaired glucose tolerance) 05/01/2017   Acute bilat watershed infarction [facility]) 04/09/2017   Intracranial vascular stenosis    Hemiparesis affecting dominant side as late effect of stroke (HCC)    S/P carotid endarterectomy    Essential hypertension    Stenosis of left carotid artery    Hyperlipidemia    Gait disturbance, post-stroke 04/02/2017   Right arm weakness    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 ([place]) 04/01/2017   PCP:  [name], DO Pharmacy:   [place] [address] Phone: [phone] Fax: [phone]  [facility] [place], [place] - [address] [place] [address] Phone: [phone] Fax: [phone]     Social Determinants of Health ([place]) Interventions    Readmission Risk Interventions No flowsheet data found.

## 2021-12-17 NOTE — Progress Notes (Signed)
Mobility Specialist: Progress Note   12/17/21 1806  Mobility  Activity  (Sat EOB)  Level of Assistance Moderate assist, patient does 50-74%  Assistive Device None  Activity Response Tolerated well  $Mobility charge 1 Mobility   Pt assisted to EOB and performed LE exercises. ModA to sit EOB with c/o 5/10 pain in her R knee. Pt assisted back to bed with call bell and phone at her side.   [facility] Michaela Shankel Mobility Specialist Mobility Specialist 5 North: [phone] Mobility Specialist 6 North: [phone]

## 2021-12-17 NOTE — Assessment & Plan Note (Addendum)
UA showed large leukocytes with 21-50 WBC.   Urine culture without any growth.  She remains afebrile.  WBC is normal.  Previous microbiology data reviewed.  No previous positive urine cultures noted.  Today is day 3 of ceftriaxone.    Keflex for 3 more days

## 2021-12-17 NOTE — Progress Notes (Signed)
[facility] PROGRESS NOTE   [name] [name] EQA:[id number] DOB: Mar 12, 1946 DOA: 12/16/2021  0 DOS: the patient was seen and examined on 12/17/2021  PCP: [name], DO  Brief History and Hospital Course:  76 y.o. female with medical history significant of HTN, hx of CVA with mild right sided weakness, GERD, depression, CKD stage 3, anxiety and breast cancer on hospice who presented after a fall.  She was using the bathroom and when she stood up her right leg just gave out and she fell. When her daughter opened the door she was on her bottom and her legs were outstretched.  She fell about 3 weeks ago as well.  Has had some pain in the right knee.  Found to have urinary tract infection.  Hospitalized for further management.  Consultants: None  Procedures: None    Subjective: Patient complains of feeling fatigued.  Denies any chest pain shortness of breath.  No abdominal pain nausea or vomiting.    Assessment/Plan:   * Weakness Patient has had generalized weakness.  Could be from her malignancy.  Could also be from UTI.   B12 level was 3999.  TSH 2.91.   Continue antibiotics.  PT and OT evaluation.  Daughter apparently not able to take care of her anymore because of her ambulatory dysfunction. PT does recommend skilled nursing facility.  Patient is under hospice services at home.  We will involve TOC to assist with disposition.  Right knee pain- (present on admission) Right  Knee pain after a fall about 3 weeks prior to admission where she fell onto her twisted knee and has had pain since that time.  Concern was for meniscal injury.  Manage conservatively for now.  UTI (urinary tract infection)- (present on admission) UA showed large leukocytes with 21-50 WBC.  Follow-up on urine culture.  Patient is on ceftriaxone empirically.  Malignant neoplasm of overlapping sites of right breast in female, estrogen receptor positive ([place]) Under hospice care at home. 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 palliative radiation therapy to the area. She subsequently received 5 fractions of treatment from 10/10/202 through 08/24/2021.  CKD (chronic kidney disease) stage 3, GFR 30-59 ml/min (HCC)- (present on admission) At baseline, continue to monitor   Essential hypertension- (present on admission) Noted to be hypertensive.  Started back on her home medications including amlodipine hydrochlorothiazide and lisinopril.    Anemia of chronic disease- (present on admission) Hemoglobin stable.  No evidence of overt bleeding.  Hemiparesis affecting dominant side as late effect of stroke (HCC) Continue ASA, lipitor  Foot drop at baseline Pt/ot   Hyperlipidemia- (present on admission) Continue lipitor   Leg swelling- (present on admission) Characteristic of venous insufficiency with venous stasis dermatitis.  BNP was normal.    Obesity Estimated body mass index is 33.23 kg/m as calculated from the following:   Height as of this encounter: 5\' 9"  (1.753 m).   Weight as of this encounter: 102.1 kg.   DVT Prophylaxis: Lovenox Code Status: DNR Family Communication: Discussed with patient.  No family at bedside Disposition Plan: To be determined  Status is: Observation The patient will require care spanning > 2 midnights and should be moved to inpatient because: Generalized weakness, ambulatory dysfunction, urinary tract infection      Medications: Scheduled:  amLODipine  10 mg Oral Daily   aspirin EC  81 mg Oral Once   atorvastatin  80 mg  Oral QHS   diclofenac Sodium  2 g Topical QID   enoxaparin (LOVENOX) injection  40 mg Subcutaneous Q24H   lisinopril  20 mg Oral Daily   And   hydrochlorothiazide  12.5 mg Oral Daily   latanoprost  1 drop Both Eyes QHS   sodium chloride flush  3 mL Intravenous Q12H   Continuous:  sodium chloride     cefTRIAXone (ROCEPHIN)  IV 1 g  (12/17/21 0839)   ZJQ:BHALPF chloride, acetaminophen **OR** acetaminophen, camphor-menthol, hydrALAZINE, loratadine, sodium chloride flush, traMADol  Antibiotics: Anti-infectives (From admission, onward)    Start     Dose/Rate Route Frequency Ordered Stop   12/17/21 1000  cefTRIAXone (ROCEPHIN) 1 g in sodium chloride 0.9 % 100 mL IVPB        1 g 200 mL/hr over 30 Minutes Intravenous Every 24 hours 12/16/21 1623     12/16/21 1430  cefTRIAXone (ROCEPHIN) 1 g in sodium chloride 0.9 % 100 mL IVPB        1 g 200 mL/hr over 30 Minutes Intravenous  Once 12/16/21 1415 12/16/21 1516       Objective:  Vital Signs  Vitals:   12/16/21 2013 12/17/21 0018 12/17/21 0418 12/17/21 0836  BP: (!) 175/69 (!) 168/68 (!) 132/53 (!) 151/57  Pulse:  (!) 104 100 100  Resp:   18 17  Temp:   99 F (37.2 C) 98.8 F (37.1 C)  TempSrc:   Oral Oral  SpO2:  95% 90% 97%  Weight:      Height:        Intake/Output Summary (Last 24 hours) at 12/17/2021 0857 Last data filed at 12/17/2021 0426 Gross per 24 hour  Intake 698.74 ml  Output 900 ml  Net -201.26 ml   Filed Weights   12/16/21 0934  Weight: 102.1 kg    General appearance: Awake alert.  In no distress Resp: Clear to auscultation bilaterally.  Normal effort Cardio: S1-S2 is normal regular.  No S3-S4.  No rubs murmurs or bruit GI: Abdomen is soft.  Nontender nondistended.  Bowel sounds are present normal.  No masses organomegaly Extremities: Limited range of motion of bilateral lower extremities    Lab Results:  Data Reviewed: I have personally reviewed labs and imaging study reports  CBC: Recent Labs  Lab 12/16/21 1002 12/17/21 0243  WBC 5.5 6.1  NEUTROABS 3.5  --   HGB 11.8* 10.0*  HCT 39.1 33.0*  MCV 77.1* 75.9*  PLT 231 790    Basic Metabolic Panel: Recent Labs  Lab 12/16/21 1002 12/17/21 0243  NA 138 137  K 4.0 3.8  CL 106 104  CO2 24 23  GLUCOSE 126* 115*  BUN 10 9  CREATININE 1.00 1.00  CALCIUM 9.0 8.5*     GFR: Estimated Creatinine Clearance: 63.8 mL/min (by C-G formula based on SCr of 1 mg/dL).  Liver Function Tests: Recent Labs  Lab 12/16/21 1002  AST 24  ALT 13  ALKPHOS 118  BILITOT 0.5  PROT 6.8  ALBUMIN 3.2*    Thyroid Function Tests: Recent Labs    12/16/21 1802  TSH 2.913    Anemia Panel: Recent Labs    12/17/21 0243  VITAMINB12 3,999*    Recent Results (from the past 240 hour(s))  Resp Panel by RT-PCR (Flu A&B, Covid) Nasopharyngeal Swab     Status: None   Collection Time: 12/16/21 10:02 AM   Specimen: Nasopharyngeal Swab; Nasopharyngeal(NP) swabs in vial transport medium  Result Value Ref Range Status  SARS Coronavirus 2 by RT PCR NEGATIVE NEGATIVE Final    Comment: (NOTE) SARS-CoV-2 target nucleic acids are NOT DETECTED.  The SARS-CoV-2 RNA is generally detectable in upper respiratory specimens during the acute phase of infection. The lowest concentration of SARS-CoV-2 viral copies this assay can detect is 138 copies/mL. A negative result does not preclude SARS-Cov-2 infection and should not be used as the sole basis for treatment or other patient management decisions. A negative result may occur with  improper specimen collection/handling, submission of specimen other than nasopharyngeal swab, presence of viral mutation(s) within the areas targeted by this assay, and inadequate number of viral copies(<138 copies/mL). A negative result must be combined with clinical observations, patient history, and epidemiological information. The expected result is Negative.  Fact Sheet for Patients:  EntrepreneurPulse.com.au  Fact Sheet for Healthcare Providers:  IncredibleEmployment.be  This test is no t yet approved or cleared by the [place] FDA and  has been authorized for detection and/or diagnosis of SARS-CoV-2 by FDA under an Emergency Use Authorization (EUA). This EUA will remain  in effect (meaning this  test can be used) for the duration of the COVID-19 declaration under Section 564(b)(1) of the Act, 21 U.S.C.section 360bbb-3(b)(1), unless the authorization is terminated  or revoked sooner.       Influenza A by PCR NEGATIVE NEGATIVE Final   Influenza B by PCR NEGATIVE NEGATIVE Final    Comment: (NOTE) The Xpert Xpress SARS-CoV-2/FLU/RSV plus assay is intended as an aid in the diagnosis of influenza from Nasopharyngeal swab specimens and should not be used as a sole basis for treatment. Nasal washings and aspirates are unacceptable for Xpert Xpress SARS-CoV-2/FLU/RSV testing.  Fact Sheet for Patients: EntrepreneurPulse.com.au  Fact Sheet for Healthcare Providers: IncredibleEmployment.be  This test is not yet approved or cleared by the [place] FDA and has been authorized for detection and/or diagnosis of SARS-CoV-2 by FDA under an Emergency Use Authorization (EUA). This EUA will remain in effect (meaning this test can be used) for the duration of the COVID-19 declaration under Section 564(b)(1) of the Act, 21 U.S.C. section 360bbb-3(b)(1), unless the authorization is terminated or revoked.  Performed at [facility], [place] [address], [place], [place] [address]       Radiology Studies: DG Chest 2 View  Result Date: 12/16/2021 CLINICAL DATA:  Fall. EXAM: CHEST - 2 VIEW COMPARISON:  May 26, 2021 FINDINGS: A new Orlich right pleural effusion with underlying opacity is identified. A left Port-A-Cath is stable. Mild streaky opacity in the left retrocardiac region on the frontal view is improved since July of 2022. The cardiomediastinal silhouette is stable. No pneumothorax. No nodules or masses. IMPRESSION: 1. New S[place] right pleural effusion with underlying opacity, likely atelectasis. 2. Mild streaky opacity in left retrocardiac region is nonspecific but probably scar or atelectasis. 3. Stable left Port-A-Cath. Electronically Signed   By:  Dorise Bullion III M.D.   On: 12/16/2021 10:54   DG Pelvis 1-2 Views  Result Date: 12/16/2021 CLINICAL DATA:  Fall. EXAM: PELVIS - 1-2 VIEW COMPARISON:  None. FINDINGS: There is no evidence of pelvic fracture or diastasis. No pelvic bone lesions are seen. IMPRESSION: Negative. Electronically Signed   By: Dorise Bullion III M.D.   On: 12/16/2021 10:56   DG Knee 1-2 Views Right  Result Date: 12/16/2021 CLINICAL DATA:  Status post fall.  Right knee pain. EXAM: RIGHT KNEE - 1-2 VIEW COMPARISON:  None. FINDINGS: No acute fracture or dislocation. No aggressive osseous lesion.  Normal alignment. No significant joint effusion. Generalized soft tissue edema in the subcutaneous fat around the knee. Peripheral vascular atherosclerotic disease. No radiopaque foreign body or soft tissue emphysema. IMPRESSION: 1. No acute osseous injury of the right knee. Electronically Signed   By: Kathreen Devoid M.D.   On: 12/16/2021 16:16       LOS: 0 days   [facility] Pager on [email or web address]  12/17/2021, 8:57 AM

## 2021-12-17 NOTE — Hospital Course (Signed)
76 y.o. female with medical history significant of HTN, hx of CVA with mild right sided weakness, GERD, depression, CKD stage 3, anxiety and breast cancer on hospice who presented after a fall.  She was using the bathroom and when she stood up her right leg just gave out and she fell. When her daughter opened the door she was on her bottom and her legs were outstretched.  She fell about 3 weeks ago as well.  Has had some pain in the right knee.  Found to have urinary tract infection.  Hospitalized for further management.

## 2021-12-17 NOTE — Assessment & Plan Note (Signed)
Hemoglobin stable.  No evidence of overt bleeding.

## 2021-12-17 NOTE — Evaluation (Signed)
Occupational Therapy Evaluation Patient Details Name: [name] [name] MRN: [id number] DOB: [birth year] Today's Date: 12/17/2021   History of Present Illness Pt is a 76 y.o. female who presented 12/16/21 s/p fall. Imaging of pelvis and R knee negative. Per verbal reports from RN in ED 2/5, pt with UTI. PMH: residual R weakness prior CVA, anxiety, cancer, carotid stenosis, depression, dyspnea, HTN, GERD   Clinical Impression   Pt reports having assistance at baseline with ADLs and functional mobility, lives with daughter. Daughter/aide assists with bathing/dressing and transfers, close supervision for ambulation within the home. Pt currently mod-max A (+2) for ADLs, mod-max A for bed mobility, and mod A for attempted sit to stand transfer. Pt unable to weight shift to R side to stand with upright posture. Pt HR in 110-120's with standing attempt and fatigues quickly. Pt presenting with impairments below, will follow. Recommend d/c to SNF.     Recommendations for follow up therapy are one component of a multi-disciplinary discharge planning process, led by the attending physician.  Recommendations may be updated based on patient status, additional functional criteria and insurance authorization.   Follow Up Recommendations  Skilled nursing-short term rehab (<3 hours/day)    Assistance Recommended at Discharge Intermittent Supervision/Assistance  Patient can return home with the following A lot of help with walking and/or transfers;A lot of help with bathing/dressing/bathroom;Assistance with cooking/housework;Assist for transportation;Help with stairs or ramp for entrance    Functional Status Assessment  Patient has had a recent decline in their functional status and demonstrates the ability to make significant improvements in function in a reasonable and predictable amount of time.  Equipment Recommendations  None recommended by OT (pt has all needed DME)    Recommendations for Other Services        Precautions / Restrictions Precautions Precautions: Fall Restrictions Weight Bearing Restrictions: No      Mobility Bed Mobility Overal bed mobility: Needs Assistance Bed Mobility: Sit to Supine, Supine to Sit     Supine to sit: Mod assist, HOB elevated Sit to supine: Max assist, Mod assist   General bed mobility comments: assist to bring BLE's back into bed    Transfers Overall transfer level: Needs assistance Equipment used: Rolling walker (2 wheels) Transfers: Sit to/from Stand Sit to Stand: Mod assist, From elevated surface           General transfer comment: increased cuing needed fors afety, unablet to weight shift to R side to get L hand on RW      Balance Overall balance assessment: Needs assistance Sitting-balance support: No upper extremity supported, Feet supported Sitting balance-Leahy Scale: Fair Sitting balance - Comments: able to sit EOB for seated ADLs for ~10 min without LOB   Standing balance support: Bilateral upper extremity supported, Reliant on assistive device for balance, During functional activity Standing balance-Leahy Scale: Poor Standing balance comment: Reliant on RW                           ADL either performed or assessed with clinical judgement   ADL Overall ADL's : Needs assistance/impaired Eating/Feeding: Moderate assistance;Sitting   Grooming: Set up;Sitting;Wash/dry face;Wash/dry hands Grooming Details (indicate cue type and reason): washes face sitting up EOB Upper Body Bathing: Moderate assistance;Sitting   Lower Body Bathing: Maximal assistance;Sitting/lateral leans   Upper Body Dressing : Moderate assistance;Sitting   Lower Body Dressing: Maximal assistance;Bed level Lower Body Dressing Details (indicate cue type and reason): pt unable to reach Toilet Transfer:  Maximal assistance;+2 for physical assistance;Stand-pivot;BSC/3in1   Toileting- Clothing Manipulation and Hygiene: Maximal  assistance;Sitting/lateral lean       Functional mobility during ADLs: Moderate assistance;Maximal assistance;Rolling walker (2 wheels);Cueing for sequencing       Vision   Vision Assessment?: No apparent visual deficits     Perception     Praxis      Pertinent Vitals/Pain Pain Assessment Pain Assessment: No/denies pain     Hand Dominance Right   Extremity/Trunk Assessment Upper Extremity Assessment Upper Extremity Assessment: Generalized weakness;RUE deficits/detail RUE Deficits / Details: shoulder AROM <50%, grip strength weak bilaterally but WFL   Lower Extremity Assessment Lower Extremity Assessment: Defer to PT evaluation   Cervical / Trunk Assessment Cervical / Trunk Assessment: Normal   Communication Communication Communication: No difficulties   Cognition Arousal/Alertness: Awake/alert Behavior During Therapy: WFL for tasks assessed/performed Overall Cognitive Status: History of cognitive impairments - at baseline                                 General Comments: unable to recall finishing breakfast this AM, able to follow single step commands without difficulty     General Comments  HR 110-120 with sitting EOB and standing attempt    Exercises     Shoulder Instructions      Home Living Family/patient expects to be discharged to:: Private residence Living Arrangements: Children Available Help at Discharge: Family;Available 24 hours/day;Personal care attendant Type of Home: Apartment Home Access: Level entry     Home Layout: One level     Bathroom Shower/Tub: Sponge bathes at baseline;Tub/shower unit   Bathroom Toilet: Standard (BSC on top)     Home Equipment: Tub bench;Rolling Walker (2 wheels);BSC/3in1;Wheelchair - manual   Additional Comments: Nurse aide comes 2x/week for ~1 hr each time to help bathe and dress pt. Hospice is partially involved      Prior Functioning/Environment Prior Level of Function : Needs assist        Physical Assist : Mobility (physical);ADLs (physical) Mobility (physical): Transfers;Stairs ADLs (physical): Dressing;Bathing;IADLs Mobility Comments: Daughter assists in pulling pt up to stand for transfers and assists in keeping RW on ground as she tends to pull up on it. Several falls in past year. Able to walk with RW without assistance. ADLs Comments: Aide assists in sponge baths and dressing. Daughter manages finances and meds.        OT Problem List: Decreased strength;Decreased range of motion;Decreased activity tolerance;Impaired balance (sitting and/or standing);Decreased safety awareness;Decreased cognition      OT Treatment/Interventions:      OT Goals(Current goals can be found in the care plan section) Acute Rehab OT Goals Patient Stated Goal: none stated OT Goal Formulation: With patient Time For Goal Achievement: 12/31/21 Potential to Achieve Goals: Fair ADL Goals Pt Will Perform Upper Body Dressing: with min assist;sitting Pt Will Perform Lower Body Dressing: with mod assist;with adaptive equipment;sit to/from stand;sitting/lateral leans Pt Will Transfer to Toilet: with mod assist;with +2 assist;stand pivot transfer;bedside commode  OT Frequency: Min 2X/week    Co-evaluation              AM-PAC OT "6 Clicks" Daily Activity     Outcome Measure Help from another person eating meals?: A Little Help from another person taking care of personal grooming?: A Little Help from another person toileting, which includes using toliet, bedpan, or urinal?: A Lot Help from another person bathing (including washing, rinsing, drying)?:  A Lot Help from another person to put on and taking off regular upper body clothing?: A Lot Help from another person to put on and taking off regular lower body clothing?: Total 6 Click Score: 13   End of Session Equipment Utilized During Treatment: Gait belt;Rolling walker (2 wheels)  Activity Tolerance: Patient tolerated treatment  well;Patient limited by fatigue Patient left:    OT Visit Diagnosis: Unsteadiness on feet (R26.81);Other abnormalities of gait and mobility (R26.89);Muscle weakness (generalized) (M62.81);History of falling (Z91.81)                Time: 2500-3704 OT Time Calculation (min): 28 min Charges:  OT General Charges $OT Visit: 1 Visit OT Evaluation $OT Eval Moderate Complexity: 1 Mod OT Treatments $Self Care/Home Management : 8-22 mins  Lynnda Child, OTD, OTR/L Acute Rehab [phone]) [phone] - [place] 12/17/2021, 9:29 AM

## 2021-12-17 NOTE — Progress Notes (Addendum)
Manufacturing engineer [facility] Hospital Liaison Note  Notified by [facility] Triage of patient's arrival via EMS from home to [facility] ED after a call was received from daughter [name] saying her mom's right knee gave out (has happened several times) and she fell so she took her to [facility] ER yesterday. She has been admitted.   Per Dr. Lyman Speller, this is a related admission.   Spoke with TOC who stated that the patient has a recommendation from PT/OT for short term rehab at [facility]. Patient is agreeable, but we are waiting to hear back from patient's daughter.    [facility] will continue to follow this patient daily during this hospitalization. Depending on disposition, [facility] will continue to follow if she goes home, but if she goes to rehab patient/family will have the option to revoke services as they understand medicare with not cover rehab with hospice services.   Please feel free to reach out with any hospice related questions as needed.   Buck Mam [facility] Liaison [phone]

## 2021-12-18 LAB — CBC
HCT: 35.6 % — ABNORMAL LOW (ref 36.0–46.0)
Hemoglobin: 10.7 g/dL — ABNORMAL LOW (ref 12.0–15.0)
MCH: 22.5 pg — ABNORMAL LOW (ref 26.0–34.0)
MCHC: 30.1 g/dL (ref 30.0–36.0)
MCV: 74.9 fL — ABNORMAL LOW (ref 80.0–100.0)
Platelets: 229 10*3/uL (ref 150–400)
RBC: 4.75 MIL/uL (ref 3.87–5.11)
RDW: 18.5 % — ABNORMAL HIGH (ref 11.5–15.5)
WBC: 6.6 10*3/uL (ref 4.0–10.5)
nRBC: 0 % (ref 0.0–0.2)

## 2021-12-18 LAB — BASIC METABOLIC PANEL
Anion gap: 11 (ref 5–15)
BUN: 7 mg/dL — ABNORMAL LOW (ref 8–23)
CO2: 25 mmol/L (ref 22–32)
Calcium: 9 mg/dL (ref 8.9–10.3)
Chloride: 103 mmol/L (ref 98–111)
Creatinine, Ser: 0.87 mg/dL (ref 0.44–1.00)
GFR, Estimated: >60 mL/min (ref >60)
Glucose, Bld: 119 mg/dL — ABNORMAL HIGH (ref 70–99)
Potassium: 3.9 mmol/L (ref 3.5–5.1)
Sodium: 139 mmol/L (ref 135–145)

## 2021-12-18 LAB — SARS CORONAVIRUS 2 (TAT 6-24 HRS): SARS Coronavirus 2: NEGATIVE

## 2021-12-18 MED ORDER — LISINOPRIL 20 MG PO TABS
40.0000 mg | ORAL_TABLET | Freq: Every day | ORAL | Status: DC
  Administered 2021-12-19: 40 mg via ORAL
  Filled 2021-12-18: qty 2

## 2021-12-18 MED ORDER — CEPHALEXIN 500 MG PO CAPS
500.0000 mg | ORAL_CAPSULE | Freq: Three times a day (TID) | ORAL | Status: DC
  Administered 2021-12-18 – 2021-12-19 (×3): 500 mg via ORAL
  Filled 2021-12-18 (×3): qty 1

## 2021-12-18 MED ORDER — HYDROCHLOROTHIAZIDE 25 MG PO TABS
25.0000 mg | ORAL_TABLET | Freq: Every day | ORAL | Status: DC
  Administered 2021-12-19: 25 mg via ORAL
  Filled 2021-12-18: qty 1

## 2021-12-18 NOTE — Progress Notes (Addendum)
Manufacturing [employer] [facility]) Hospitalized Hospice Patient Note   Notified by [facility] Triage of patient's arrival via EMS from home to [facility] ED after a call was received from daughter [name] saying her mom's right knee gave out (has happened several times) and she fell so she took her to [facility] ER yesterday. She has been admitted.    Per Dr. Lyman Speller, this is a related admission.   Visited patient at the bedside to discuss dispositon planning. Patient and her daughter [name] have agreed for patient to go to SNF for short term rehab. They are aware that they have the option to revoke services, but readmit once rehab is finished if they would like. Education provided.   Patient remains GIP appropriate due to need for IV abx(last dose received today) for symptom management.   V/S: 98.5, 94, 17, 158/62, SpO2 93%  Abnormal Labs Glucose 70 - 99 mg/dL 119 (H)  BUN 8 - 23 mg/dL 7 (L)  Hemoglobin 12.0 - 15.0 g/dL 10.7 (L)  HCT 36.0 - 46.0 % 35.6 (L)  MCV 80.0 - 100.0 fL 74.9 (L)  MCH 26.0 - 34.0 pg 22.5 (L)  RDW 11.5 - 15.5 % 18.5 (H)   Diagnostics IMPRESSION: 1. No acute osseous injury of the right knee.  IV/PRN: Last dose of IV abx (rocephin) received today  Problem List * Weakness Patient has had generalized weakness.  Could be from her malignancy.  Could also be from UTI.   B12 level was 3999.  TSH 2.91.   Patient was treated for UTI.  Seen by PT and OT.  Skilled nursing facility for short-term rehab was recommended. Daughter apparently not able to take care of her anymore because of her ambulatory dysfunction. Patient is under hospice services at home.  TOC is following.   Right knee pain- (present on admission) Right  Knee pain after a fall about 3 weeks prior to admission where she fell onto her twisted knee and has had pain since that time.  Concern was for meniscal injury.  Manage conservatively for now.   UTI (urinary tract infection)- (present on admission) UA showed large  leukocytes with 21-50 WBC.   Urine culture without any growth.  She remains afebrile.  WBC is normal.  Previous microbiology data reviewed.  No previous positive urine cultures noted.  Today is day 3 of ceftriaxone.  Will change her to Keflex after today's dose.    Malignant neoplasm of overlapping sites of right breast in female, estrogen receptor positive ([place]) Under hospice care at home. 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 palliative radiation therapy to the area. She subsequently received 5 fractions of treatment from 10/10/202 through 08/24/2021.   CKD (chronic kidney disease) stage 3, GFR 30-59 ml/min (HCC)- (present on admission) At baseline, continue to monitor    Essential hypertension- (present on admission) Blood pressure remains elevated.  Noted to be on amlodipine hydrochlorothiazide and lisinopril.  We will increase the dose of her lisinopril.     Anemia of chronic disease- (present on admission) Hemoglobin stable.  No evidence of overt bleeding.   Hemiparesis affecting dominant side as late effect of stroke (HCC) Continue ASA, lipitor  Foot drop at baseline Pt/ot    Hyperlipidemia- (present on admission) Continue lipitor    Leg swelling- (present on admission) Characteristic of venous insufficiency with venous stasis dermatitis.  BNP was normal.    Obesity Estimated body mass index  is 33.23 kg/m as calculated from the following:   Height as of this encounter: 5\' 9"  (1.753 m).   Weight as of this encounter: 102.1 kg.   Discharge Planning: Ongoing- plan to discharge to SNF for rehab tomorrow  Family contact: Spoke with daughter via telephone at bedside. Aware and agreeable to the plan  IDT: Updated  Goals of Care: Clear  Should patient need ambulance transfer at discharge- please use [facility]([place]) as they contract this service for our active hospice  patients.  [facility] Liaison [phone]

## 2021-12-18 NOTE — Progress Notes (Signed)
[facility] PROGRESS NOTE   [name] [name] PNT:[id number] DOB: 05/19/1946 DOA: 12/16/2021  1 DOS: the patient was seen and examined on 12/18/2021  PCP: [name], DO  Brief History and Hospital Course:  76 y.o. female with medical history significant of HTN, hx of CVA with mild right sided weakness, GERD, depression, CKD stage 3, anxiety and breast cancer on hospice who presented after a fall.  She was using the bathroom and when she stood up her right leg just gave out and she fell. When her daughter opened the door she was on her bottom and her legs were outstretched.  She fell about 3 weeks ago as well.  Has had some pain in the right knee.  Found to have urinary tract infection.  Hospitalized for further management.  Consultants: None  Procedures: None    Subjective: Continues to feel fatigued.  Denies any nausea vomiting.  No chest pain or shortness of breath.     Assessment/Plan:   * Weakness Patient has had generalized weakness.  Could be from her malignancy.  Could also be from UTI.   B12 level was 3999.  TSH 2.91.   Patient was treated for UTI.  Seen by PT and OT.  Skilled nursing facility for short-term rehab was recommended. Daughter apparently not able to take care of her anymore because of her ambulatory dysfunction. Patient is under hospice services at home.  TOC is following.  Right knee pain- (present on admission) Right  Knee pain after a fall about 3 weeks prior to admission where she fell onto her twisted knee and has had pain since that time.  Concern was for meniscal injury.  Manage conservatively for now.  UTI (urinary tract infection)- (present on admission) UA showed large leukocytes with 21-50 WBC.   Urine culture without any growth.  She remains afebrile.  WBC is normal.  Previous microbiology data reviewed.  No previous positive urine cultures noted.  Today is day 3 of ceftriaxone.  Will change her to Keflex after today's dose.   Malignant  neoplasm of overlapping sites of right breast in female, estrogen receptor positive ([place]) Under hospice care at home. 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 palliative radiation therapy to the area. She subsequently received 5 fractions of treatment from 10/10/202 through 08/24/2021.  CKD (chronic kidney disease) stage 3, GFR 30-59 ml/min (HCC)- (present on admission) At baseline, continue to monitor   Essential hypertension- (present on admission) Blood pressure remains elevated.  Noted to be on amlodipine hydrochlorothiazide and lisinopril.  We will increase the dose of her lisinopril.    Anemia of chronic disease- (present on admission) Hemoglobin stable.  No evidence of overt bleeding.  Hemiparesis affecting dominant side as late effect of stroke (HCC) Continue ASA, lipitor  Foot drop at baseline Pt/ot   Hyperlipidemia- (present on admission) Continue lipitor   Leg swelling- (present on admission) Characteristic of venous insufficiency with venous stasis dermatitis.  BNP was normal.    Obesity Estimated body mass index is 33.23 kg/m as calculated from the following:   Height as of this encounter: 5\' 9"  (1.753 m).   Weight as of this encounter: 102.1 kg.   DVT Prophylaxis: Lovenox Code Status: DNR Family Communication: Discussed with patient.  No family at bedside Disposition Plan: SNF  Status is: Inpatient  Remains inpatient appropriate because: Generalized weakness, ambulatory dysfunction, UTI       Medications:  Scheduled:  amLODipine  10 mg Oral Daily   aspirin EC  81 mg Oral Once   atorvastatin  80 mg Oral QHS   diclofenac Sodium  2 g Topical QID   enoxaparin (LOVENOX) injection  40 mg Subcutaneous Q24H   lisinopril  20 mg Oral Daily   And   hydrochlorothiazide  12.5 mg Oral Daily   latanoprost  1 drop Both Eyes QHS   sodium chloride flush  3 mL Intravenous Q12H    Continuous:  sodium chloride     cefTRIAXone (ROCEPHIN)  IV 1 g (12/18/21 0833)   ZOX:WRUEAV chloride, acetaminophen **OR** acetaminophen, camphor-menthol, hydrALAZINE, loratadine, sodium chloride flush, traMADol  Antibiotics: Anti-infectives (From admission, onward)    Start     Dose/Rate Route Frequency Ordered Stop   12/17/21 1000  cefTRIAXone (ROCEPHIN) 1 g in sodium chloride 0.9 % 100 mL IVPB        1 g 200 mL/hr over 30 Minutes Intravenous Every 24 hours 12/16/21 1623     12/16/21 1430  cefTRIAXone (ROCEPHIN) 1 g in sodium chloride 0.9 % 100 mL IVPB        1 g 200 mL/hr over 30 Minutes Intravenous  Once 12/16/21 1415 12/16/21 1516       Objective:  Vital Signs  Vitals:   12/17/21 1554 12/17/21 2018 12/18/21 0449 12/18/21 0809  BP: (!) 153/75 (!) 169/62 (!) 158/63 (!) 158/75  Pulse: 92 (!) 101 94 96  Resp: 16 17 17 19   Temp: 98.6 F (37 C) 98.6 F (37 C) 98.5 F (36.9 C) 99.1 F (37.3 C)  TempSrc: Oral  Oral Oral  SpO2: 95% 93% 93% 98%  Weight:      Height:        Intake/Output Summary (Last 24 hours) at 12/18/2021 1102 Last data filed at 12/17/2021 1331 Gross per 24 hour  Intake 300 ml  Output 1200 ml  Net -900 ml    Filed Weights   12/16/21 0934  Weight: 102.1 kg    General appearance: Awake alert.  In no distress Resp: Clear to auscultation bilaterally.  Normal effort Cardio: S1-S2 is normal regular.  No S3-S4.  No rubs murmurs or bruit GI: Abdomen is soft.  Nontender nondistended.  Bowel sounds are present normal.  No masses organomegaly Extremities: Limited range of motion of bilateral lower extremities.  Able to move the left leg better than the right. Neurologic: Alert and oriented x3.  No focal neurological deficits.      Lab Results:  Data Reviewed: I have personally reviewed labs and imaging study reports  CBC: Recent Labs  Lab 12/16/21 1002 12/17/21 0243 12/18/21 0343  WBC 5.5 6.1 6.6  NEUTROABS 3.5  --   --   HGB 11.8* 10.0*  10.7*  HCT 39.1 33.0* 35.6*  MCV 77.1* 75.9* 74.9*  PLT 231 223 229     Basic Metabolic Panel: Recent Labs  Lab 12/16/21 1002 12/17/21 0243 12/18/21 0343  NA 138 137 139  K 4.0 3.8 3.9  CL 106 104 103  CO2 24 23 25   GLUCOSE 126* 115* 119*  BUN 10 9 7*  CREATININE 1.00 1.00 0.87  CALCIUM 9.0 8.5* 9.0     GFR: Estimated Creatinine Clearance: 73.3 mL/min (by C-G formula based on SCr of 0.87 mg/dL).  Liver Function Tests: Recent Labs  Lab 12/16/21 1002  AST 24  ALT 13  ALKPHOS 118  BILITOT 0.5  PROT 6.8  ALBUMIN 3.2*     Thyroid Function  Tests: Recent Labs    12/16/21 1802  TSH 2.913     Anemia Panel: Recent Labs    12/17/21 0243  VITAMINB12 3,999*     Recent Results (from the past 240 hour(s))  Resp Panel by RT-PCR (Flu A&B, Covid) Nasopharyngeal Swab     Status: None   Collection Time: 12/16/21 10:02 AM   Specimen: Nasopharyngeal Swab; Nasopharyngeal(NP) swabs in vial transport medium  Result Value Ref Range Status   SARS Coronavirus 2 by RT PCR NEGATIVE NEGATIVE Final    Comment: (NOTE) SARS-CoV-2 target nucleic acids are NOT DETECTED.  The SARS-CoV-2 RNA is generally detectable in upper respiratory specimens during the acute phase of infection. The lowest concentration of SARS-CoV-2 viral copies this assay can detect is 138 copies/mL. A negative result does not preclude SARS-Cov-2 infection and should not be used as the sole basis for treatment or other patient management decisions. A negative result may occur with  improper specimen collection/handling, submission of specimen other than nasopharyngeal swab, presence of viral mutation(s) within the areas targeted by this assay, and inadequate number of viral copies(<138 copies/mL). A negative result must be combined with clinical observations, patient history, and epidemiological information. The expected result is Negative.  Fact Sheet for Patients:   EntrepreneurPulse.com.au  Fact Sheet for Healthcare Providers:  IncredibleEmployment.be  This test is no t yet approved or cleared by the [place] FDA and  has been authorized for detection and/or diagnosis of SARS-CoV-2 by FDA under an Emergency Use Authorization (EUA). This EUA will remain  in effect (meaning this test can be used) for the duration of the COVID-19 declaration under Section 564(b)(1) of the Act, 21 U.S.C.section 360bbb-3(b)(1), unless the authorization is terminated  or revoked sooner.       Influenza A by PCR NEGATIVE NEGATIVE Final   Influenza B by PCR NEGATIVE NEGATIVE Final    Comment: (NOTE) The Xpert Xpress SARS-CoV-2/FLU/RSV plus assay is intended as an aid in the diagnosis of influenza from Nasopharyngeal swab specimens and should not be used as a sole basis for treatment. Nasal washings and aspirates are unacceptable for Xpert Xpress SARS-CoV-2/FLU/RSV testing.  Fact Sheet for Patients: EntrepreneurPulse.com.au  Fact Sheet for Healthcare Providers: IncredibleEmployment.be  This test is not yet approved or cleared by the [place] FDA and has been authorized for detection and/or diagnosis of SARS-CoV-2 by FDA under an Emergency Use Authorization (EUA). This EUA will remain in effect (meaning this test can be used) for the duration of the COVID-19 declaration under Section 564(b)(1) of the Act, 21 U.S.C. section 360bbb-3(b)(1), unless the authorization is terminated or revoked.  Performed at [facility], [place] [address], [place], [place] [address]   Urine Culture     Status: Abnormal   Collection Time: 12/16/21 10:02 AM   Specimen: In/Out Cath Urine  Result Value Ref Range Status   Specimen Description IN/OUT CATH URINE  Final   Special Requests   Final    NONE Performed at [facility], [place] [address], [place], [place] [address]    Culture MULTIPLE  SPECIES PRESENT, SUGGEST RECOLLECTION (A)  Final   Report Status 12/17/2021 FINAL  Final       Radiology Studies: DG Knee 1-2 Views Right  Result Date: 12/16/2021 CLINICAL DATA:  Status post fall.  Right knee pain. EXAM: RIGHT KNEE - 1-2 VIEW COMPARISON:  None. FINDINGS: No acute fracture or dislocation. No aggressive osseous lesion. Normal alignment. No significant joint effusion. Generalized soft tissue  edema in the subcutaneous fat around the knee. Peripheral vascular atherosclerotic disease. No radiopaque foreign body or soft tissue emphysema. IMPRESSION: 1. No acute osseous injury of the right knee. Electronically Signed   By: Kathreen Devoid M.D.   On: 12/16/2021 16:16       LOS: 1 day   [place] [facility] Pager on [email or web address]  12/18/2021, 11:02 AM

## 2021-12-18 NOTE — Progress Notes (Signed)
Mobility Specialist: Progress Note   12/18/21 1502  Mobility  Activity Stood at bedside  Level of Assistance Maximum assist, patient does 25-49%  Assistive Device Front wheel walker  Distance Ambulated (ft) 2 ft  Activity Response Tolerated well  $Mobility charge 1 Mobility   Pre-Mobility: 105 HR, 96% SpO2 During Mobility: 124 HR Post-Mobility: 102 HR  Pt received in bed and agreeable to mobility. Assisted to EOB using pad to scoot hips around requiring heavy modA-maxA. Pt stood x2 with one seated break between bouts. Bouts lasted roughly 2 minutes each. After second stand had pt side step to [address] [facility] before sitting. C/o pain in her R knee during session she rated 5-6/10. Pt back to bed with RN present in the room.   [facility] Sydney Azure Mobility Specialist Mobility Specialist 5 North: [phone] Mobility Specialist 6 North: [phone]

## 2021-12-18 NOTE — TOC Progression Note (Addendum)
Transition of Care [facility]) - Progression Note    Patient Details  Name: [name] [name] MRN: [id number] Date of Birth: 12/15/1945  [facility] CM/SW Contact  Joanne Chars, LCSW Phone Number: 12/18/2021, 10:27 AM  Clinical Narrative:  CSW spoke with pt daughter [name] who confirmed that she is in agreement with plan for SNF.  CSW provided current bed offer of [facility].  [name] asking for responses from [facility].     1100: [facility] makes bed offer.  Daughter informed and does accept.      Expected Discharge Plan: [place] Barriers to Discharge: Continued Medical Work up, SNF Pending bed offer  Expected Discharge Plan and Services Expected Discharge Plan: [place] In-house Referral: Clinical Social Work   Post Acute Care Choice: [place] Living arrangements for the past 2 months: Single Family Home                                       Social Determinants of Health (SDOH) Interventions    Readmission Risk Interventions No flowsheet data found.

## 2021-12-19 ENCOUNTER — Telehealth: Payer: Self-pay | Admitting: *Deleted

## 2021-12-19 LAB — CBC
HCT: 34.9 % — ABNORMAL LOW (ref 36.0–46.0)
Hemoglobin: 10.9 g/dL — ABNORMAL LOW (ref 12.0–15.0)
MCH: 23.4 pg — ABNORMAL LOW (ref 26.0–34.0)
MCHC: 31.2 g/dL (ref 30.0–36.0)
MCV: 74.9 fL — ABNORMAL LOW (ref 80.0–100.0)
Platelets: 224 10*3/uL (ref 150–400)
RBC: 4.66 MIL/uL (ref 3.87–5.11)
RDW: 18.7 % — ABNORMAL HIGH (ref 11.5–15.5)
WBC: 6.2 10*3/uL (ref 4.0–10.5)
nRBC: 0 % (ref 0.0–0.2)

## 2021-12-19 LAB — BASIC METABOLIC PANEL
Anion gap: 12 (ref 5–15)
BUN: 10 mg/dL (ref 8–23)
CO2: 24 mmol/L (ref 22–32)
Calcium: 8.7 mg/dL — ABNORMAL LOW (ref 8.9–10.3)
Chloride: 101 mmol/L (ref 98–111)
Creatinine, Ser: 0.93 mg/dL (ref 0.44–1.00)
GFR, Estimated: >60 mL/min (ref >60)
Glucose, Bld: 125 mg/dL — ABNORMAL HIGH (ref 70–99)
Potassium: 4 mmol/L (ref 3.5–5.1)
Sodium: 137 mmol/L (ref 135–145)

## 2021-12-19 MED ORDER — CAMPHOR-MENTHOL 0.5-0.5 % EX LOTN: TOPICAL_LOTION | Freq: Two times a day (BID) | CUTANEOUS | 0 refills | Status: DC | PRN

## 2021-12-19 MED ORDER — LISINOPRIL 40 MG PO TABS: 40.0000 mg | ORAL_TABLET | Freq: Every day | ORAL | Status: DC

## 2021-12-19 MED ORDER — HYDROCHLOROTHIAZIDE 25 MG PO TABS: 25.0000 mg | ORAL_TABLET | Freq: Every day | ORAL | Status: DC

## 2021-12-19 MED ORDER — DICLOFENAC SODIUM 1 % EX GEL: 2.0000 g | Freq: Four times a day (QID) | CUTANEOUS | Status: DC

## 2021-12-19 MED ORDER — TRAMADOL HCL 50 MG PO TABS
50.0000 mg | ORAL_TABLET | Freq: Four times a day (QID) | ORAL | 0 refills | Status: DC | PRN
Start: 2021-12-19 — End: 2023-01-15

## 2021-12-19 MED ORDER — CEPHALEXIN 500 MG PO CAPS: 500.0000 mg | ORAL_CAPSULE | Freq: Three times a day (TID) | ORAL | Status: DC

## 2021-12-19 NOTE — Progress Notes (Signed)
Physical Therapy Treatment Patient Details Name: [name] [name] MRN: [id number] DOB: 1946/07/12 Today's Date: 12/19/2021   History of Present Illness Pt is a 76 y.o. female who presented 12/16/21 s/p fall. Imaging of pelvis and R knee negative. Per verbal reports from RN in ED 2/5, pt with UTI. PMH: residual R weakness prior CVA, anxiety, cancer, carotid stenosis, depression, dyspnea, HTN, GERD    PT Comments    Pt received in supine and agreeable to session with fair tolerance for bed mobility and LE exercise. Pt requiring +2 mod assist to come to EOB for LE management and bringing trunk upright. Once EOB pt with fair sitting balance with ability to complete LE exercise with intermittent posterior support, no LOB. Total assist +2 for standing attempt with pt able to clear hips but unable to come to standing this session with RW. Pt continues to benefit from skilled PT services to progress toward functional mobility goals.    Recommendations for follow up therapy are one component of a multi-disciplinary discharge planning process, led by the attending physician.  Recommendations may be updated based on patient status, additional functional criteria and insurance authorization.  Follow Up Recommendations  Skilled nursing-short term rehab (<3 hours/day)     Assistance Recommended at Discharge Intermittent Supervision/Assistance  Patient can return home with the following A lot of help with walking and/or transfers;A lot of help with bathing/dressing/bathroom;Assistance with cooking/housework;Direct supervision/assist for medications management;Direct supervision/assist for financial management;Assist for transportation;Help with stairs or ramp for entrance   Equipment Recommendations  None recommended by PT    Recommendations for Other Services       Precautions / Restrictions Precautions Precautions: Fall Restrictions Weight Bearing Restrictions: No     Mobility  Bed Mobility Overal  bed mobility: Needs Assistance Bed Mobility: Sit to Supine, Supine to Sit, Rolling Rolling: Mod assist (mod assist with cues for bedrail use)   Supine to sit: Mod assist, HOB elevated, +2 for physical assistance Sit to supine: Max assist, Mod assist, +2 for physical assistance   General bed mobility comments: assist to bring BLE's to/off EOB and to fully elvated trunk. assist to bring BLE back into bed    Transfers Overall transfer level: Needs assistance Equipment used: Rolling walker (2 wheels) Transfers: Sit to/from Stand Sit to Stand: From elevated surface, Total assist, +2 physical assistance       General transfer comment: increased cuing needed fors safety, unablet to unable to come to fully standing, able to clear hips with total assist x1    Ambulation/Gait       General Gait Details: unable   Stairs             Wheelchair Mobility    Modified Rankin (Stroke Patients Only)       Balance Overall balance assessment: Needs assistance Sitting-balance support: No upper extremity supported, Feet supported Sitting balance-Leahy Scale: Fair Sitting balance - Comments: able to sit EOB for seated exercise for ~10 min without LOB, some assist needed to keep from leaning posterior.   Standing balance support: Bilateral upper extremity supported, Reliant on assistive device for balance, During functional activity Standing balance-Leahy Scale: Poor Standing balance comment: unable to stand this session          Cognition Arousal/Alertness: Awake/alert Behavior During Therapy: WFL for tasks assessed/performed Overall Cognitive Status: History of cognitive impairments - at baseline       General Comments: able to follow single step commands without difficulty  Exercises      General Comments        Pertinent Vitals/Pain Pain Assessment Pain Assessment: Faces Faces Pain Scale: Hurts little more Pain Location: R knee Pain Descriptors /  Indicators: Aching Pain Intervention(s): Limited activity within patient's tolerance, Monitored during session, Repositioned    Home Living                          Prior Function            PT Goals (current goals can now be found in the care plan section) Acute Rehab PT Goals PT Goal Formulation: With patient/family Time For Goal Achievement: 12/30/21    Frequency    Min 2X/week      PT Plan      Co-evaluation PT/OT/SLP Co-Evaluation/Treatment: Yes Reason for Co-Treatment: Complexity of the patient's impairments (multi-system involvement);For patient/therapist safety PT goals addressed during session: Mobility/safety with mobility;Strengthening/ROM;Balance        AM-PAC PT "6 Clicks" Mobility   Outcome Measure  Help needed turning from your back to your side while in a flat bed without using bedrails?: A Lot Help needed moving from lying on your back to sitting on the side of a flat bed without using bedrails?: A Lot Help needed moving to and from a bed to a chair (including a wheelchair)?: Total Help needed standing up from a chair using your arms (e.g., wheelchair or bedside chair)?: Total Help needed to walk in hospital room?: Total Help needed climbing 3-5 steps with a railing? : Total 6 Click Score: 8    End of Session Equipment Utilized During Treatment: Gait belt Activity Tolerance: Patient limited by fatigue Patient left: in bed;with call bell/phone within reach;with bed alarm set Nurse Communication: Mobility status;Other (comment) PT Visit Diagnosis: Unsteadiness on feet (R26.81);Other abnormalities of gait and mobility (R26.89);Muscle weakness (generalized) (M62.81);History of falling (Z91.81);Repeated falls (R29.6);Difficulty in walking, not elsewhere classified (R26.2);Pain Pain - Right/Left: Right Pain - part of body: Knee     Time: 0071-2197 PT Time Calculation (min) (ACUTE ONLY): 26 min  Charges:  $Therapeutic Exercise: 8-22  mins                     Pearlie Lafosse R. PTA Acute Rehabilitation Services Office: [place] 12/19/2021, 11:27 AM

## 2021-12-19 NOTE — TOC Progression Note (Addendum)
Transition of Care [facility]) - Progression Note    Patient Details  Name: [name] [name] MRN: [id number] Date of Birth: 06-10-1946  [facility] CM/SW Contact  Joanne Chars, LCSW Phone Number: 12/19/2021, 11:01 AM  Clinical Narrative:   CSW informed by [facility] that they have covid outbreak still going on, they are accepting admissions but want family to be aware.  CSW spoke with pt daughter [name] and this concerned her, she asked about prior contact with [facility] and whether [facility] offer at bed.  CSW spoke with Sharyn Lull at [facility], they can offer a bed, but semi private, can take pt today.  LM with [name], waiting on call back.  1110: CSW spoke with [name], she does want to proceed with [facility].  [facility] informed.     Expected Discharge Plan: [place] Barriers to Discharge: Continued Medical Work up, SNF Pending bed offer  Expected Discharge Plan and Services Expected Discharge Plan: [place] In-house Referral: Clinical Social Work   Post Acute Care Choice: [place] arrangements for the past 2 months: Single Family Home Expected Discharge Date: 12/19/21                                     Social Determinants of Health (SDOH) Interventions    Readmission Risk Interventions No flowsheet data found.

## 2021-12-19 NOTE — Telephone Encounter (Signed)
Received a call from [name] [name] with [facility] in reference to pt, stating pt is revoking Hospice in order to go to rehab at skilled nursing facility. Pt will be going to [facility] for rehab. Pt had a fall recently that sent her to the ED. Right knee was fine but pt was treated for UTI. Daughter requested pt goes to skilled facility to strengthen right side weakness.

## 2021-12-19 NOTE — Progress Notes (Signed)
Called report to [phone] spoke with Altha Harm, LPN. Verbalized understanding of report, thanked her and patient is ready for PTAR.

## 2021-12-19 NOTE — Assessment & Plan Note (Signed)
Estimated body mass index is 33.23 kg/m as calculated from the following:   Height as of this encounter: 5\' 9"  (1.753 m).   Weight as of this encounter: 102.1 kg.

## 2021-12-19 NOTE — Discharge Summary (Signed)
Physician Discharge Summary   Patient: [name] [name] MRN: [id number] DOB: 1946-01-08  Admit date:     12/16/2021  Discharge date: 12/19/21  Discharge Physician: Geradine Girt   PCP: [name], DO   Recommendations at discharge:    Keflex for 3 more days DNR  Discharge Diagnoses: Principal Problem:   Weakness Active Problems:   Hyperlipidemia   Hemiparesis affecting dominant side as late effect of stroke [facility])   Essential hypertension   Malignant neoplasm of overlapping sites of right breast in female, estrogen receptor positive (HCC)   CKD (chronic kidney disease) stage 3, GFR 30-59 ml/min (HCC)   Obesity (BMI 30-39.9)   Right knee pain   Leg swelling   UTI (urinary tract infection)   Anemia of chronic disease  Resolved Problems:   * No resolved hospital problems. *   Hospital Course: 76 y.o. female with medical history significant of HTN, hx of CVA with mild right sided weakness, GERD, depression, CKD stage 3, anxiety and breast cancer on hospice who presented after a fall.  She was using the bathroom and when she stood up her right leg just gave out and she fell. When her daughter opened the door she was on her bottom and her legs were outstretched.  She fell about 3 weeks ago as well.  Has had some pain in the right knee.  Found to have urinary tract infection.  Hospitalized for further management.  Assessment and Plan: * Weakness Patient has had generalized weakness.  Could be from her malignancy.  Could also be from UTI.   B12 level was 3999.  TSH 2.91.   Patient was treated for UTI.  Seen by PT and OT.  Skilled nursing facility for short-term rehab was recommended. Daughter apparently not able to take care of her anymore because of her ambulatory dysfunction. Patient is under hospice services at home SNF placement  Anemia of chronic disease- (present on admission) Hemoglobin stable.  No evidence of overt bleeding.  UTI (urinary tract infection)- (present on  admission) UA showed large leukocytes with 21-50 WBC.   Urine culture without any growth.  She remains afebrile.  WBC is normal.  Previous microbiology data reviewed.  No previous positive urine cultures noted.  Today is day 3 of ceftriaxone.    Keflex for 3 more days  Leg swelling- (present on admission) Characteristic of venous insufficiency with venous stasis dermatitis.  BNP was normal.   Right knee pain- (present on admission) Right  Knee pain after a fall about 3 weeks prior to admission where she fell onto her twisted knee and has had pain since that time.  Concern was for meniscal injury.  Manage conservatively for now.  Obesity (BMI 30-39.9)- (present on admission) Estimated body mass index is 33.23 kg/m as calculated from the following:   Height as of this encounter: 5\' 9"  (1.753 m).   Weight as of this encounter: 102.1 kg.  CKD (chronic kidney disease) stage 3, GFR 30-59 ml/min (HCC)- (present on admission) At baseline, continue to monitor   Malignant neoplasm of overlapping sites of right breast in female, estrogen receptor positive ([place]) Under hospice care at home. 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 palliative radiation therapy to the area. She subsequently received 5 fractions of treatment from 10/10/202 through 08/24/2021.  Essential hypertension- (present on admission) Blood pressure remains elevated.  Noted to be on amlodipine hydrochlorothiazide and lisinopril.  We will increase the dose of her lisinopril.    Hemiparesis affecting dominant side as late effect of stroke (HCC) Continue ASA, lipitor  Foot drop at baseline Pt/ot   Hyperlipidemia- (present on admission) Continue lipitor         Disposition: Skilled nursing facility Diet recommendation:  Discharge Diet Orders (From admission, onward)     Start     Ordered   12/19/21 0000  Diet general         12/19/21 0959           Regular diet  DISCHARGE MEDICATION: Allergies as of 12/19/2021   No Known Allergies      Medication List     STOP taking these medications    carvedilol 3.125 MG tablet Commonly known as: COREG   furosemide 20 MG tablet Commonly known as: LASIX   lisinopril-hydrochlorothiazide 20-12.5 MG tablet Commonly known as: ZESTORETIC   pramoxine 1 % Lotn Commonly known as: Sarna Sensitive       TAKE these medications    amLODipine 10 MG tablet Commonly known as: NORVASC TAKE 1 TABLET BY MOUTH ONCE DAILY   aspirin 81 MG EC tablet Take 1 tablet by mouth daily.   atorvastatin 80 MG tablet Commonly known as: LIPITOR Take 1 tablet by mouth once a day What changed: Another medication with the same name was removed. Continue taking this medication, and follow the directions you see here.   camphor-menthol lotion Commonly known as: SARNA Apply topically 2 (two) times daily as needed for itching.   cephALEXin 500 MG capsule Commonly known as: KEFLEX Take 1 capsule (500 mg total) by mouth every 8 (eight) hours.   diclofenac Sodium 1 % Gel Commonly known as: VOLTAREN Apply 2 g topically 4 (four) times daily.   hydrochlorothiazide 25 MG tablet Commonly known as: HYDRODIURIL Take 1 tablet (25 mg total) by mouth daily. Start taking on: December 20, 2021   latanoprost 0.005 % ophthalmic solution Commonly known as: XALATAN PLACE 1 DROP IN BOTH EYES EVERY EVENING   lisinopril 40 MG tablet Commonly known as: ZESTRIL Take 1 tablet (40 mg total) by mouth daily. Start taking on: December 20, 2021   loratadine 10 MG tablet Commonly known as: CLARITIN Take 10 mg by mouth daily as needed for allergies.   traMADol 50 MG tablet Commonly known as: ULTRAM Take 1 tablet (50 mg total) by mouth every 6 (six) hours as needed for moderate pain. What changed:  how much to take how to take this when to take this reasons to take this        Contact  information for after-discharge care     Destination     [facility] Preferred SNF .   Service: Skilled Nursing Contact information: [place] [phone]                     Discharge Exam: Danley Danker Weights   12/16/21 0934  Weight: 102.1 kg    General: Appearance:    Obese female in no acute distress                    Condition at discharge: stable  The results of significant diagnostics from this hospitalization (including imaging, microbiology, ancillary and laboratory) are listed below for reference.   Imaging Studies: DG Chest 2 View  Result Date: 12/16/2021 CLINICAL DATA:  Fall. EXAM: CHEST - 2 VIEW COMPARISON:  May 26, 2021 FINDINGS: A new  [place] right pleural effusion with underlying opacity is identified. A left Port-A-Cath is stable. Mild streaky opacity in the left retrocardiac region on the frontal view is improved since July of 2022. The cardiomediastinal silhouette is stable. No pneumothorax. No nodules or masses. IMPRESSION: 1. [place] right pleural effusion with underlying opacity, likely atelectasis. 2. Mild streaky opacity in left retrocardiac region is nonspecific but probably scar or atelectasis. 3. Stable left Port-A-Cath. Electronically Signed   By: Dorise Bullion III M.D.   On: 12/16/2021 10:54   DG Pelvis 1-2 Views  Result Date: 12/16/2021 CLINICAL DATA:  Fall. EXAM: PELVIS - 1-2 VIEW COMPARISON:  None. FINDINGS: There is no evidence of pelvic fracture or diastasis. No pelvic bone lesions are seen. IMPRESSION: Negative. Electronically Signed   By: Dorise Bullion III M.D.   On: 12/16/2021 10:56   DG Knee 1-2 Views Right  Result Date: 12/16/2021 CLINICAL DATA:  Status post fall.  Right knee pain. EXAM: RIGHT KNEE - 1-2 VIEW COMPARISON:  None. FINDINGS: No acute fracture or dislocation. No aggressive osseous lesion. Normal alignment. No significant joint effusion. Generalized soft tissue edema in the  subcutaneous fat around the knee. Peripheral vascular atherosclerotic disease. No radiopaque foreign body or soft tissue emphysema. IMPRESSION: 1. No acute osseous injury of the right knee. Electronically Signed   By: Kathreen Devoid M.D.   On: 12/16/2021 16:16   DG Knee Complete 4 Views Right  Result Date: 11/19/2021 CLINICAL DATA:  Right-sided knee pain EXAM: RIGHT KNEE - COMPLETE 4+ VIEW COMPARISON:  Femur radiograph 06/28/2021 FINDINGS: No fracture or malalignment. Minimal patellofemoral degenerative change. No sizable effusion. Vascular calcifications IMPRESSION: Minimal degenerative change.  No acute osseous abnormality Electronically Signed   By: Donavan Foil M.D.   On: 11/19/2021 17:34    Microbiology: Results for orders placed or performed during the hospital encounter of 12/16/21  Resp Panel by RT-PCR (Flu A&B, Covid) Nasopharyngeal Swab     Status: None   Collection Time: 12/16/21 10:02 AM   Specimen: Nasopharyngeal Swab; Nasopharyngeal(NP) swabs in vial transport medium  Result Value Ref Range Status   SARS Coronavirus 2 by RT PCR NEGATIVE NEGATIVE Final    Comment: (NOTE) SARS-CoV-2 target nucleic acids are NOT DETECTED.  The SARS-CoV-2 RNA is generally detectable in upper respiratory specimens during the acute phase of infection. The lowest concentration of SARS-CoV-2 viral copies this assay can detect is 138 copies/mL. A negative result does not preclude SARS-Cov-2 infection and should not be used as the sole basis for treatment or other patient management decisions. A negative result may occur with  improper specimen collection/handling, submission of specimen other than nasopharyngeal swab, presence of viral mutation(s) within the areas targeted by this assay, and inadequate number of viral copies(<138 copies/mL). A negative result must be combined with clinical observations, patient history, and epidemiological information. The expected result is Negative.  Fact Sheet for  Patients:  EntrepreneurPulse.com.au  Fact Sheet for Healthcare Providers:  IncredibleEmployment.be  This test is no t yet approved or cleared by the [place] FDA and  has been authorized for detection and/or diagnosis of SARS-CoV-2 by FDA under an Emergency Use Authorization (EUA). This EUA will remain  in effect (meaning this test can be used) for the duration of the COVID-19 declaration under Section 564(b)(1) of the Act, 21 U.S.C.section 360bbb-3(b)(1), unless the authorization is terminated  or revoked sooner.       Influenza A by PCR NEGATIVE NEGATIVE Final   Influenza B by PCR  NEGATIVE NEGATIVE Final    Comment: (NOTE) The Xpert Xpress SARS-CoV-2/FLU/RSV plus assay is intended as an aid in the diagnosis of influenza from Nasopharyngeal swab specimens and should not be used as a sole basis for treatment. Nasal washings and aspirates are unacceptable for Xpert Xpress SARS-CoV-2/FLU/RSV testing.  Fact Sheet for Patients: EntrepreneurPulse.com.au  Fact Sheet for Healthcare Providers: IncredibleEmployment.be  This test is not yet approved or cleared by the [place] FDA and has been authorized for detection and/or diagnosis of SARS-CoV-2 by FDA under an Emergency Use Authorization (EUA). This EUA will remain in effect (meaning this test can be used) for the duration of the COVID-19 declaration under Section 564(b)(1) of the Act, 21 U.S.C. section 360bbb-3(b)(1), unless the authorization is terminated or revoked.  Performed at [facility], [place] [address], [place], [place] [address]   Urine Culture     Status: Abnormal   Collection Time: 12/16/21 10:02 AM   Specimen: In/Out Cath Urine  Result Value Ref Range Status   Specimen Description IN/OUT CATH URINE  Final   Special Requests   Final    NONE Performed at [facility], [place] [address], [place], [place] [address]    Culture  MULTIPLE SPECIES PRESENT, SUGGEST RECOLLECTION (A)  Final   Report Status 12/17/2021 FINAL  Final  SARS CORONAVIRUS 2 (TAT 6-24 HRS) Nasopharyngeal Nasopharyngeal Swab     Status: None   Collection Time: 12/18/21 12:16 PM   Specimen: Nasopharyngeal Swab  Result Value Ref Range Status   SARS Coronavirus 2 NEGATIVE NEGATIVE Final    Comment: (NOTE) SARS-CoV-2 target nucleic acids are NOT DETECTED.  The SARS-CoV-2 RNA is generally detectable in upper and lower respiratory specimens during the acute phase of infection. Negative results do not preclude SARS-CoV-2 infection, do not rule out co-infections with other pathogens, and should not be used as the sole basis for treatment or other patient management decisions. Negative results must be combined with clinical observations, patient history, and epidemiological information. The expected result is Negative.  Fact Sheet for Patients: SugarRoll.be  Fact Sheet for Healthcare Providers: https://www.woods-mathews.com/  This test is not yet approved or cleared by the [place] FDA and  has been authorized for detection and/or diagnosis of SARS-CoV-2 by FDA under an Emergency Use Authorization (EUA). This EUA will remain  in effect (meaning this test can be used) for the duration of the COVID-19 declaration under Se ction 564(b)(1) of the Act, 21 U.S.C. section 360bbb-3(b)(1), unless the authorization is terminated or revoked sooner.  Performed at [facility], [place] [address] [place], [place] [address]     Labs: CBC: Recent Labs  Lab 12/16/21 1002 12/17/21 0243 12/18/21 0343 12/19/21 0309  WBC 5.5 6.1 6.6 6.2  NEUTROABS 3.5  --   --   --   HGB 11.8* 10.0* 10.7* 10.9*  HCT 39.1 33.0* 35.6* 34.9*  MCV 77.1* 75.9* 74.9* 74.9*  PLT 231 223 229 993   Basic Metabolic Panel: Recent Labs  Lab 12/16/21 1002 12/17/21 0243 12/18/21 0343 12/19/21 0309  NA 138 137 139 137  K 4.0  3.8 3.9 4.0  CL 106 104 103 101  CO2 24 23 25 24   GLUCOSE 126* 115* 119* 125*  BUN 10 9 7* 10  CREATININE 1.00 1.00 0.87 0.93  CALCIUM 9.0 8.5* 9.0 8.7*   Liver Function Tests: Recent Labs  Lab 12/16/21 1002  AST 24  ALT 13  ALKPHOS 118  BILITOT 0.5  PROT  6.8  ALBUMIN 3.2*   CBG: No results for input(s): GLUCAP in the last 168 hours.  Discharge time spent: greater than 30 minutes.  Signed: Geradine Girt, DO [facility] 12/19/2021

## 2021-12-19 NOTE — TOC Transition Note (Signed)
Transition of Care [facility]) - CM/SW Discharge Note   Patient Details  Name: Missie Gehrig MRN: [id number] Date of Birth: 1946/01/27  Transition of [facility]) CM/SW Contact:  Joanne Chars, LCSW Phone Number: 12/19/2021, 11:22 AM   Clinical Narrative:   Pt discharging to [facility].  RN call [phone] for report. PTAR called [phone].    Final next level of care: Skilled Nursing Facility Barriers to Discharge: Barriers Resolved   Patient Goals and CMS Choice Patient states their goals for this hospitalization and ongoing recovery are:: "get strength back" CMS Medicare.gov Compare Post Acute Care list provided to:: Patient Choice offered to / list presented to : Patient  Discharge Placement              Patient chooses bed at:  [facility]) Patient to be transferred to facility by: [place] Name of family member notified: daughter [name] Patient and family notified of of transfer: 12/19/21  Discharge Plan and Services In-house Referral: Clinical Social Work   Post Acute Care Choice: [place]                               Social Determinants of Health (SDOH) Interventions     Readmission Risk Interventions No flowsheet data found.

## 2021-12-19 NOTE — Progress Notes (Signed)
Occupational Therapy Treatment Patient Details Name: [name] [name] MRN: [id number] DOB: February 26, 1946 Today's Date: 12/19/2021   History of present illness Pt is a 76 y.o. female who presented 12/16/21 s/p fall. Imaging of pelvis and R knee negative. Per verbal reports from RN in ED 2/5, pt with UTI. PMH: residual R weakness prior CVA, anxiety, cancer, carotid stenosis, depression, dyspnea, HTN, GERD   OT comments  Pt seen today for progression of functional mobility and ADLs, pt mod-max A+2 for bed mobility and attempted transfer. Pt able to clear hips minimally with standing attempt using RW and +2 max A, pt reporting pain in R knee, further transfers deferred. Pt able to complete seated grooming task with set up A, max A for other ADLs this session. Pt able to complete UE/LE exercises during session, however fatigues quickly. Pt presenting with impairments listed below, will follow. Continue to recommend SNF at d/c.   Recommendations for follow up therapy are one component of a multi-disciplinary discharge planning process, led by the attending physician.  Recommendations may be updated based on patient status, additional functional criteria and insurance authorization.    Follow Up Recommendations  Skilled nursing-short term rehab (<3 hours/day)    Assistance Recommended at Discharge Intermittent Supervision/Assistance  Patient can return home with the following  A lot of help with walking and/or transfers;A lot of help with bathing/dressing/bathroom;Assistance with cooking/housework;Assist for transportation;Help with stairs or ramp for entrance   Equipment Recommendations  None recommended by OT;Other (comment) (defer to next venue of care)    Recommendations for Other Services      Precautions / Restrictions Precautions Precautions: Fall Restrictions Weight Bearing Restrictions: No       Mobility Bed Mobility Overal bed mobility: Needs Assistance Bed Mobility: Rolling, Supine to  Sit, Sit to Supine Rolling: Mod assist   Supine to sit: Mod assist, HOB elevated, +2 for physical assistance Sit to supine: Max assist, Mod assist, +2 for physical assistance   General bed mobility comments: assisting trunk and BLE into bed    Transfers Overall transfer level: Needs assistance Equipment used: Rolling walker (2 wheels) Transfers: Sit to/from Stand Sit to Stand: From elevated surface, Total assist, +2 physical assistance           General transfer comment: unable to achieve full standing, clears hips minimally     Balance Overall balance assessment: Needs assistance Sitting-balance support: No upper extremity supported, Feet supported Sitting balance-Leahy Scale: Fair Sitting balance - Comments: able to sit EOB for seated exercise for ~10 min without LOB, some assist needed to keep from leaning posterior.   Standing balance support: Bilateral upper extremity supported, Reliant on assistive device for balance, During functional activity Standing balance-Leahy Scale: Poor                             ADL either performed or assessed with clinical judgement   ADL Overall ADL's : Needs assistance/impaired     Grooming: Set up;Sitting;Wash/dry face;Wash/dry hands Grooming Details (indicate cue type and reason): washes face sitting up EOB Upper Body Bathing: Maximal assistance;Sitting Upper Body Bathing Details (indicate cue type and reason): wash back sitting EOB         Lower Body Dressing: Maximal assistance;Bed level Lower Body Dressing Details (indicate cue type and reason): don/doff socks at bed level             Functional mobility during ADLs: Moderate assistance;Maximal assistance  Extremity/Trunk Assessment Upper Extremity Assessment Upper Extremity Assessment: Generalized weakness RUE Deficits / Details: shoulder AROM <50%, grip strength weak bilaterally but WFL   Lower Extremity Assessment Lower Extremity Assessment:  Defer to PT evaluation        Vision   Vision Assessment?: No apparent visual deficits   Perception Perception Perception: Not tested   Praxis Praxis Praxis: Not tested    Cognition Arousal/Alertness: Awake/alert Behavior During Therapy: WFL for tasks assessed/performed Overall Cognitive Status: History of cognitive impairments - at baseline                                 General Comments: able to follow single step commands without difficulty        Exercises Exercises: General Upper Extremity General Exercises - Upper Extremity Elbow Flexion: AROM, Both, 10 reps, Seated Elbow Extension: AROM, Both, 10 reps, Seated Wrist Flexion: AROM, Both, 10 reps, Seated Wrist Extension: AROM, Both, 10 reps, Seated    Shoulder Instructions       General Comments VSS on RA, new SpO2 monitor applied to pt's finger    Pertinent Vitals/ Pain       Pain Assessment Pain Assessment: Faces Pain Score: 4  Faces Pain Scale: Hurts little more Pain Location: R knee Pain Descriptors / Indicators: Aching Pain Intervention(s): Limited activity within patient's tolerance, Monitored during session, Repositioned  Home Living                                          Prior Functioning/Environment              Frequency  Min 2X/week        Progress Toward Goals  OT Goals(current goals can now be found in the care plan section)  Progress towards OT goals: Progressing toward goals  Acute Rehab OT Goals Patient Stated Goal: to leave hospital OT Goal Formulation: With patient Time For Goal Achievement: 12/31/21 Potential to Achieve Goals: Fair ADL Goals Pt Will Perform Upper Body Dressing: with min assist;sitting Pt Will Perform Lower Body Dressing: with mod assist;with adaptive equipment;sit to/from stand;sitting/lateral leans Pt Will Transfer to Toilet: with mod assist;with +2 assist;stand pivot transfer;bedside commode  Plan Discharge plan  remains appropriate;Frequency remains appropriate    Co-evaluation    PT/OT/SLP Co-Evaluation/Treatment: Yes Reason for Co-Treatment: Complexity of the patient's impairments (multi-system involvement);For patient/therapist safety;To address functional/ADL transfers PT goals addressed during session: Mobility/safety with mobility;Strengthening/ROM;Balance OT goals addressed during session: Strengthening/ROM      AM-PAC OT "6 Clicks" Daily Activity     Outcome Measure   Help from another person eating meals?: A Lot Help from another person taking care of personal grooming?: A Little Help from another person toileting, which includes using toliet, bedpan, or urinal?: A Lot Help from another person bathing (including washing, rinsing, drying)?: A Lot Help from another person to put on and taking off regular upper body clothing?: A Lot Help from another person to put on and taking off regular lower body clothing?: Total 6 Click Score: 12    End of Session Equipment Utilized During Treatment: Gait belt;Rolling walker (2 wheels)  OT Visit Diagnosis: Unsteadiness on feet (R26.81);Other abnormalities of gait and mobility (R26.89);Muscle weakness (generalized) (M62.81);History of falling (Z91.81)   Activity Tolerance Patient tolerated treatment well;Patient limited by fatigue   Patient Left  in bed;with call bell/phone within reach;with bed alarm set   Nurse Communication Mobility status        Time: 3524-8185 OT Time Calculation (min): 26 min  Charges: OT General Charges $OT Visit: 1 Visit OT Treatments $Self Care/Home Management : 8-22 mins  Lynnda Child, OTD, OTR/L Acute Rehab [phone] - [place] 12/19/2021, 12:37 PM

## 2021-12-24 ENCOUNTER — Encounter: Payer: Self-pay | Admitting: Oncology

## 2021-12-25 ENCOUNTER — Encounter: Payer: Self-pay | Admitting: Oncology

## 2021-12-26 ENCOUNTER — Other Ambulatory Visit: Payer: Self-pay

## 2021-12-26 ENCOUNTER — Encounter: Payer: Self-pay | Admitting: Oncology

## 2021-12-26 ENCOUNTER — Other Ambulatory Visit: Payer: Self-pay | Admitting: *Deleted

## 2021-12-26 ENCOUNTER — Non-Acute Institutional Stay: Payer: Medicare Other | Admitting: Internal Medicine

## 2021-12-26 VITALS — BP 125/60 | HR 90 | Temp 99.1°F | Resp 18 | Ht 66.0 in | Wt 211.2 lb

## 2021-12-26 DIAGNOSIS — Z515 Encounter for palliative care: Secondary | ICD-10-CM

## 2021-12-26 DIAGNOSIS — C50811 Malignant neoplasm of overlapping sites of right female breast: Secondary | ICD-10-CM

## 2021-12-26 DIAGNOSIS — R531 Weakness: Secondary | ICD-10-CM

## 2021-12-26 DIAGNOSIS — G8929 Other chronic pain: Secondary | ICD-10-CM

## 2021-12-26 NOTE — Patient Outreach (Signed)
Per [place] eligible member [facility].  Screened for potential [facility] Care Management services as a benefit of member's insurance plan.  Member's PCP at [facility] has [facility] care management services available if needed post SNF.    Update received from [place] indicating transition plan is to return home with hospice services. Was active with [facility] hospice prior to admission. Transition date not set yet.   Will continue to follow while member resides in SNF. Will send notification to Upstream if transition plans change.     Marthenia Rolling, MSN, RN,BSN [place] Acute Care Coordinator [phone] [facility]) [phone]  (Toll free office)

## 2022-01-01 NOTE — Progress Notes (Signed)
Therapist, nutritional Palliative Care Follow-Up Visit Telephone: [phone]  Fax: [phone]   Date of encounter: 01/01/22 8:07 AM PATIENT NAME: [name] [name] [address] [name] [address]   [phone] (home)  DOB: 1946/08/13 MRN: [id number] PRIMARY CARE PROVIDER:    Irena Reichmann, DO,  [address] [place] [address] [place] [address] [phone]  REFERRING PROVIDER:   Fae [name] at [facility]  RESPONSIBLE PARTY:    Contact Information     Name Relation Home Work [name] [name] Daughter [phone]          I met face to face with patient and family in [facility] facility. Palliative Care was asked to follow this patient by consultation request of  [name] Bristle, NP, to address advance care planning and complex medical decision making. This is follow-up visit.                                     ASSESSMENT AND PLAN / RECOMMENDATIONS:   Advance Care Planning/Goals of Care: Goals include to maximize quality of life and symptom management. Patient/health care surrogate gave his/her permission to discuss.Our advance care planning conversation included a discussion about:    The value and importance of advance care planning  Experiences with loved ones who have been seriously ill or have died  Exploration of personal, cultural or spiritual beliefs that might influence medical decisions  Exploration of goals of care in the event of a sudden injury or illness  Identification  of a healthcare agent  Review and updating or creation of an  advance directive document . Decision not to resuscitate or to de-escalate disease focused treatments due to poor prognosis. CODE STATUS:  MOST on file already:  DNR, limited, abx if indicated, IVF if indicated and no feeding tube as completed 12/19/21 at rehab admission.  Note that patient had revoked our hospice program to come here for therapy.    Symptom Management/Plan: 1. Malignant neoplasm  of overlapping sites of right breast in female, estrogen receptor positive (HCC) -pt had been under [facility] prior to her falls and weakness, now here for rehab and notes indicate she'd become too weak to stay at home--pt is under the impression that she will return home after therapy; when therapy complete, could be readmitted to hospice if pt and her daughter support this at that time -care is palliative  2. Chronic pain of right knee -continue therapy and current regimen for pain control--was not reporting pain at rest -not a surgical candidate  3. Weakness -continue PT, OT here to improve strength and stability  4. Palliative care encounter -will follow while here for rehab, ? Long-term care  Follow up Palliative Care Visit: Palliative care will continue to follow for complex medical decision making, advance care planning, and clarification of goals. Return 4 weeks or prn.  This visit was coded based on medical decision making (MDM).  PPS: 40%  HOSPICE ELIGIBILITY/DIAGNOSIS: Yes/breast cancer  Chief Complaint: Follow-up palliative visit  HISTORY OF PRESENT ILLNESS:  [name] [name] is a 76 y.o. year old female  with breast cancer (paget's of breast) who'd been on [facility]ice, htn, h/o stroke with mild right hemiparesis including right foot drop, obesity, gerd, depression, ckd3, and anxiety here for rehab s/p admission from 2/5-8 with fall and right knee pain.   She was treated for a UTI and knee pain felt to  possibly have meniscal tear and xrays with severe OA.  Not a surgical candidate so here for therapy.  She also had edema of both legs due to venous insufficiency.  Had normal BNP.    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  palliative radiation therapy to the area. She subsequently received 5 fractions of treatment from 10/10/202 through 08/24/2021.  She was then receiving hospice.  Her  daughter was unable to care for her at home due to her falls.     History obtained from review of EMR, discussion with primary team, and interview with family, facility staff/caregiver and/or [name] [name].   I reviewed available labs, medications, imaging, studies and related documents from the EMR.  Records reviewed and summarized above.   ROS  General: NAD EYES: denies vision changes ENMT: denies dysphagia Cardiovascular: denies chest pain, denies DOE, some chronic edema, no pitting at present Pulmonary: denies cough, denies increased SOB Abdomen: endorses good appetite, denies constipation, endorses incontinence of bowel GU: denies dysuria, endorses incontinence of urine MSK:  has increased weakness, falls reported at home prior to hospitalization, none here Skin: right chest wall wound from breast cancer Neurological: denies pain in knees at rest, denies insomnia Psych: Endorses positive mood Heme/lymph/immuno: denies bruises, abnormal bleeding  Physical Exam: Current and past weights:  211.2 on 2/14 down 3 lbs from 12/20/21 Constitutional: NAD General: obese, sitting up in bed EYES: anicteric sclera, lids intact, no discharge  ENMT: intact hearing, oral mucous membranes moist CV: S1S2, RRR, nonpitting LE edema Pulmonary: LCTA, no increased work of breathing, no cough, room air Abdomen: intake 75-100%, normo-active BS + 4 quadrants, soft and non tender, no ascites GU: deferred MSK: sarcopenia, moves all extremities, uses walker and wheelchair Skin: warm and dry, right breast wound  Neuro:  has generalized weakness,  has cognitive impairment Psych: non-anxious affect, A and O x 3 Hem/lymph/immuno: no widespread bruising  CURRENT PROBLEM LIST:  Patient Active Problem List   Diagnosis Date Noted   UTI (urinary tract infection) 12/17/2021   Anemia of chronic disease 12/17/2021   Right knee pain 12/16/2021   Leg swelling 12/16/2021   Obesity (BMI 30-39.9) 05/25/2021   Weakness  05/24/2021   CKD (chronic kidney disease) stage 3, GFR 30-59 ml/min (HCC) 05/24/2021   Pain due to onychomycosis of toenails of both feet 05/07/2019   [place] durum 05/07/2019   Coagulation disorder (HCC) 05/07/2019   Multiple lung nodules 01/11/2019   Port-A-Cath in place 10/09/2018   Malignant neoplasm of overlapping sites of right breast in female, estrogen receptor positive (HCC) 08/24/2018   Glucose intolerance (impaired glucose tolerance) 05/01/2017   Acute bilat watershed infarction [facility]) 04/09/2017   Intracranial vascular stenosis    Hemiparesis affecting dominant side as late effect of stroke (HCC)    S/P carotid endarterectomy    Essential hypertension    Stenosis of left carotid artery    Hyperlipidemia    Gait disturbance, post-stroke 04/02/2017   Right arm weakness    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 (HCC) 04/01/2017   PAST MEDICAL HISTORY:  Active Ambulatory Problems    Diagnosis Date Noted   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 (HCC) 04/01/2017   Gait disturbance, post-stroke 04/02/2017   Right arm weakness    Stenosis of left carotid artery    Hyperlipidemia    Intracranial vascular stenosis  Hemiparesis affecting dominant side as late effect of stroke (HCC)    S/P carotid endarterectomy    Essential hypertension    Acute bilat watershed infarction ([place]) 04/09/2017   Glucose intolerance (impaired glucose tolerance) 05/01/2017   Malignant neoplasm of overlapping sites of right breast in female, estrogen receptor positive ([place]) 08/24/2018   Port-A-Cath in place 10/09/2018   Multiple lung nodules 01/11/2019   Pain due to onychomycosis of toenails of both feet 05/07/2019   [place] durum 05/07/2019   Coagulation disorder ([place]) 05/07/2019   Weakness 05/24/2021   CKD (chronic kidney disease) stage 3, GFR 30-59 ml/min (HCC) 05/24/2021   Obesity (BMI 30-39.9) 05/25/2021   Right knee pain 12/16/2021    Leg swelling 12/16/2021   UTI (urinary tract infection) 12/17/2021   Anemia of chronic disease 12/17/2021   Resolved Ambulatory Problems    Diagnosis Date Noted   Hypertensive emergency 04/01/2017   Hypokalemia 04/01/2017   Hyperglycemia 04/01/2017   Stroke-like symptoms 04/01/2017   Slowness and poor responsiveness    Post-operative pain    Hypertensive crisis    Depression 05/01/2017   Inflammatory breast cancer, right ([place]) 08/26/2018   Goals of care, counseling/discussion 05/12/2019   History of CVA (cerebrovascular accident) 05/24/2021   Sepsis ([place]) 05/24/2021   COVID-19 virus infection 05/24/2021   Past Medical History:  Diagnosis Date   Anxiety    Cancer ([place])    Carotid stenosis    Dyspnea    GERD (gastroesophageal reflux disease)    Hypertension    Stroke ([place]) 04/01/2017   SOCIAL HX:  Social History   Tobacco Use   Smoking status: Never   Smokeless tobacco: Never  Substance Use Topics   Alcohol use: No     ALLERGIES: No Known Allergies   PERTINENT MEDICATIONS:  Outpatient Encounter Medications as of 12/26/2021  Medication Sig   amLODipine (NORVASC) 10 MG tablet TAKE 1 TABLET BY MOUTH ONCE DAILY   aspirin 81 MG EC tablet Take 1 tablet by mouth daily.   atorvastatin (LIPITOR) 80 MG tablet Take 1 tablet by mouth once a day   camphor-menthol (SARNA) lotion Apply topically 2 (two) times daily as needed for itching.   cephALEXin (KEFLEX) 500 MG capsule Take 1 capsule (500 mg total) by mouth every 8 (eight) hours.   diclofenac Sodium (VOLTAREN) 1 % GEL Apply 2 g topically 4 (four) times daily.   hydrochlorothiazide (HYDRODIURIL) 25 MG tablet Take 1 tablet (25 mg total) by mouth daily.   latanoprost (XALATAN) 0.005 % ophthalmic solution PLACE 1 DROP IN BOTH EYES EVERY EVENING   lisinopril (ZESTRIL) 40 MG tablet Take 1 tablet (40 mg total) by mouth daily.   loratadine (CLARITIN) 10 MG tablet Take 10 mg by mouth daily as needed for allergies.   traMADol  (ULTRAM) 50 MG tablet Take 1 tablet (50 mg total) by mouth every 6 (six) hours as needed for moderate pain.   [DISCONTINUED] prochlorperazine (COMPAZINE) 10 MG tablet Take 1 tablet (10 mg total) by mouth 3 (three) times daily before meals.   No facility-administered encounter medications on file as of 12/26/2021.   Thank you for the opportunity to participate in the care of Ms. Duplantis.  The palliative care team will continue to follow. Please call our office at [phone] if we can be of additional assistance.   Hollace Kinnier, DO  COVID-19 PATIENT SCREENING TOOL Asked and negative response unless otherwise noted:  Have you had symptoms of covid, tested positive or been in contact with  someone with symptoms/positive test in the past 5-10 days? no

## 2022-01-03 ENCOUNTER — Other Ambulatory Visit: Payer: Self-pay | Admitting: *Deleted

## 2022-01-03 NOTE — Patient Outreach (Signed)
[facility] Post- Acute Care Coordinator follow up. Per [place] eligible member resides in [facility]. Screened for potential care coordination services post SNF.  Member's PCP at [facility] has [facility] care management services available if needed post SNF.  Facility site visit to [facility] skilled nursing facility. Met with Irine Seal, SNF SW concerning member's transition plan. Anticipated transition plan is home with hospice. SNF SW reports family wants member to be able to perform transfers at min assist before returning home.   Will follow for transition date.    Marthenia Rolling, MSN, RN,BSN [place] Acute Care Coordinator [phone] [facility]) [phone]  (Toll free office)

## 2022-01-05 ENCOUNTER — Encounter: Payer: Self-pay | Admitting: Internal Medicine

## 2022-03-04 IMAGING — DX DG KNEE 1-2V*R*
2 series · 2 of 2 positions shown · non-contrast
Comparison: None.

CLINICAL DATA: Status post fall.  Right knee pain.

EXAM:
RIGHT KNEE - 1-2 VIEW

[knee ap]
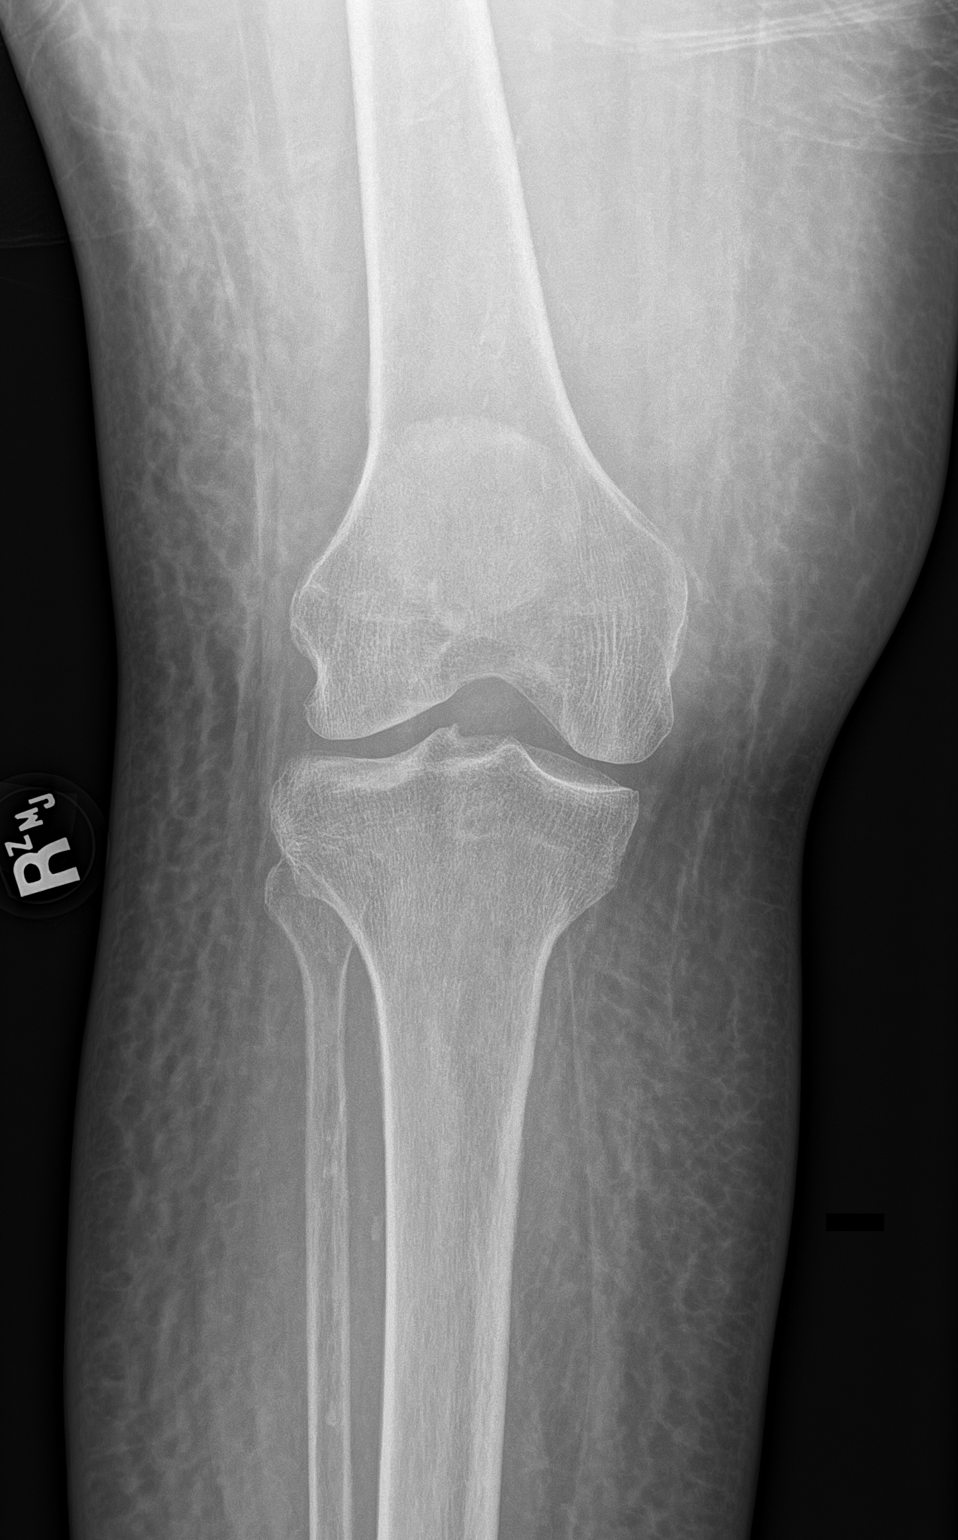

[knee lat]
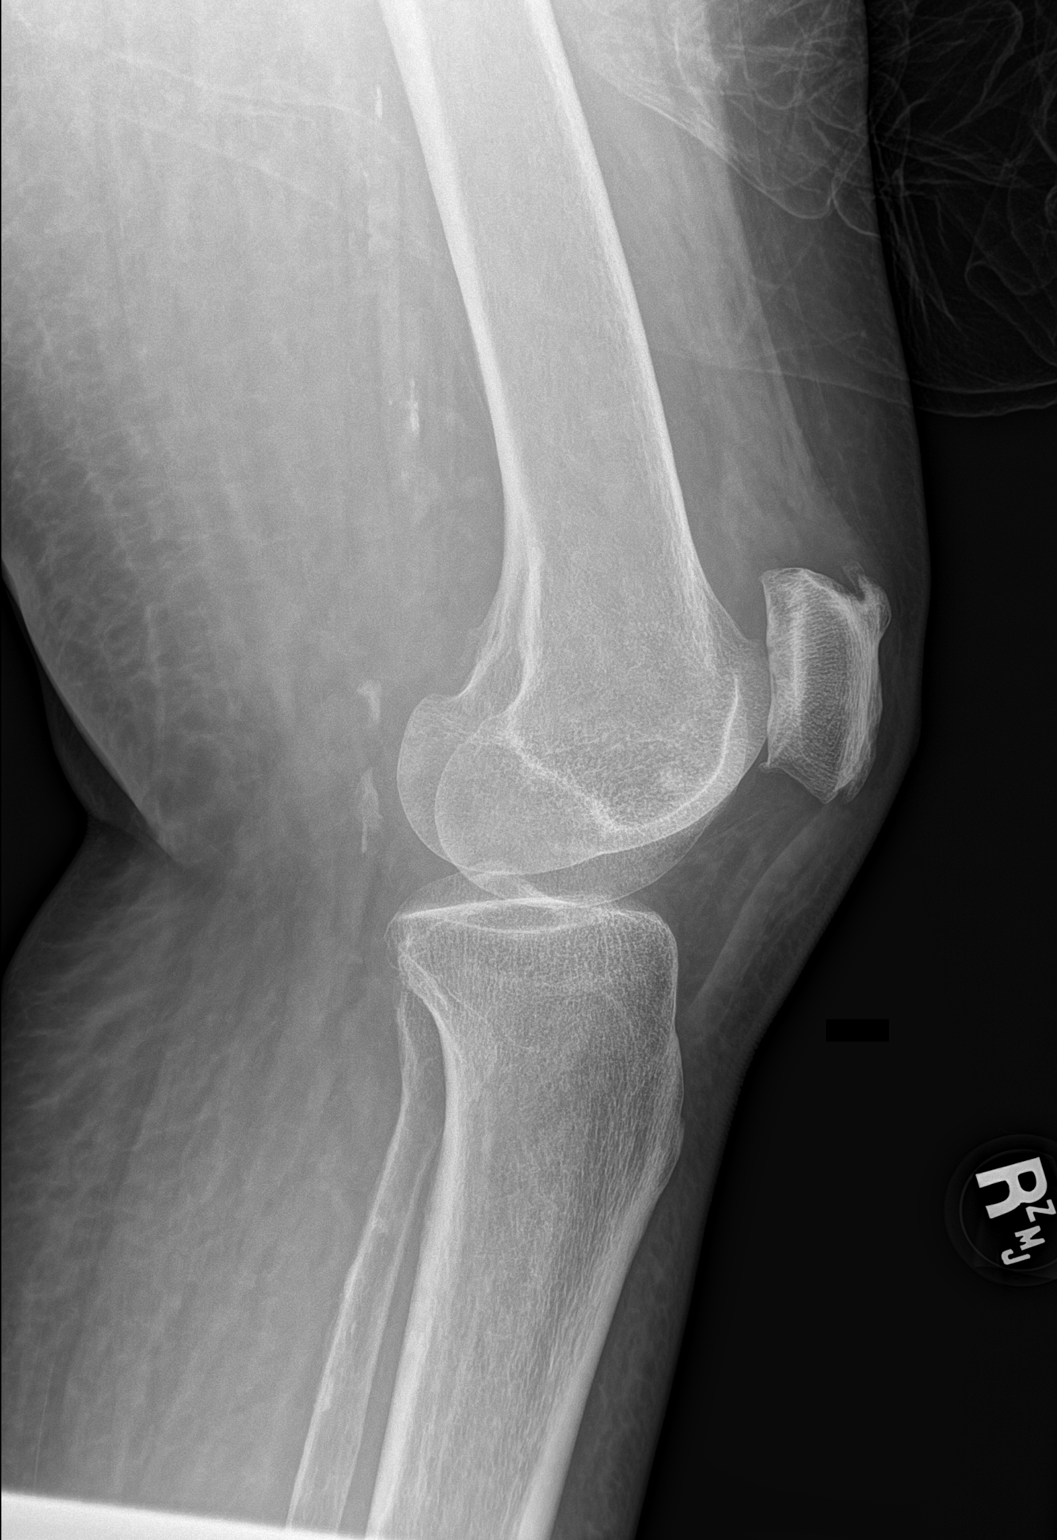

[2 of 2 positions shown; findings below may reference images not displayed]

FINDINGS: No acute fracture or dislocation. No aggressive osseous lesion.
Normal alignment. No significant joint effusion.

Generalized soft tissue edema in the subcutaneous fat around the
knee. Peripheral vascular atherosclerotic disease. No radiopaque
foreign body or soft tissue emphysema.
IMPRESSION: 1. No acute osseous injury of the right knee.

## 2022-03-04 IMAGING — CR DG PELVIS 1-2V
2 series · 2 of 2 positions shown · non-contrast
Comparison: None.

CLINICAL DATA: Fall.

EXAM:
PELVIS - 1-2 VIEW

[pelvis ap (1 of 2)]
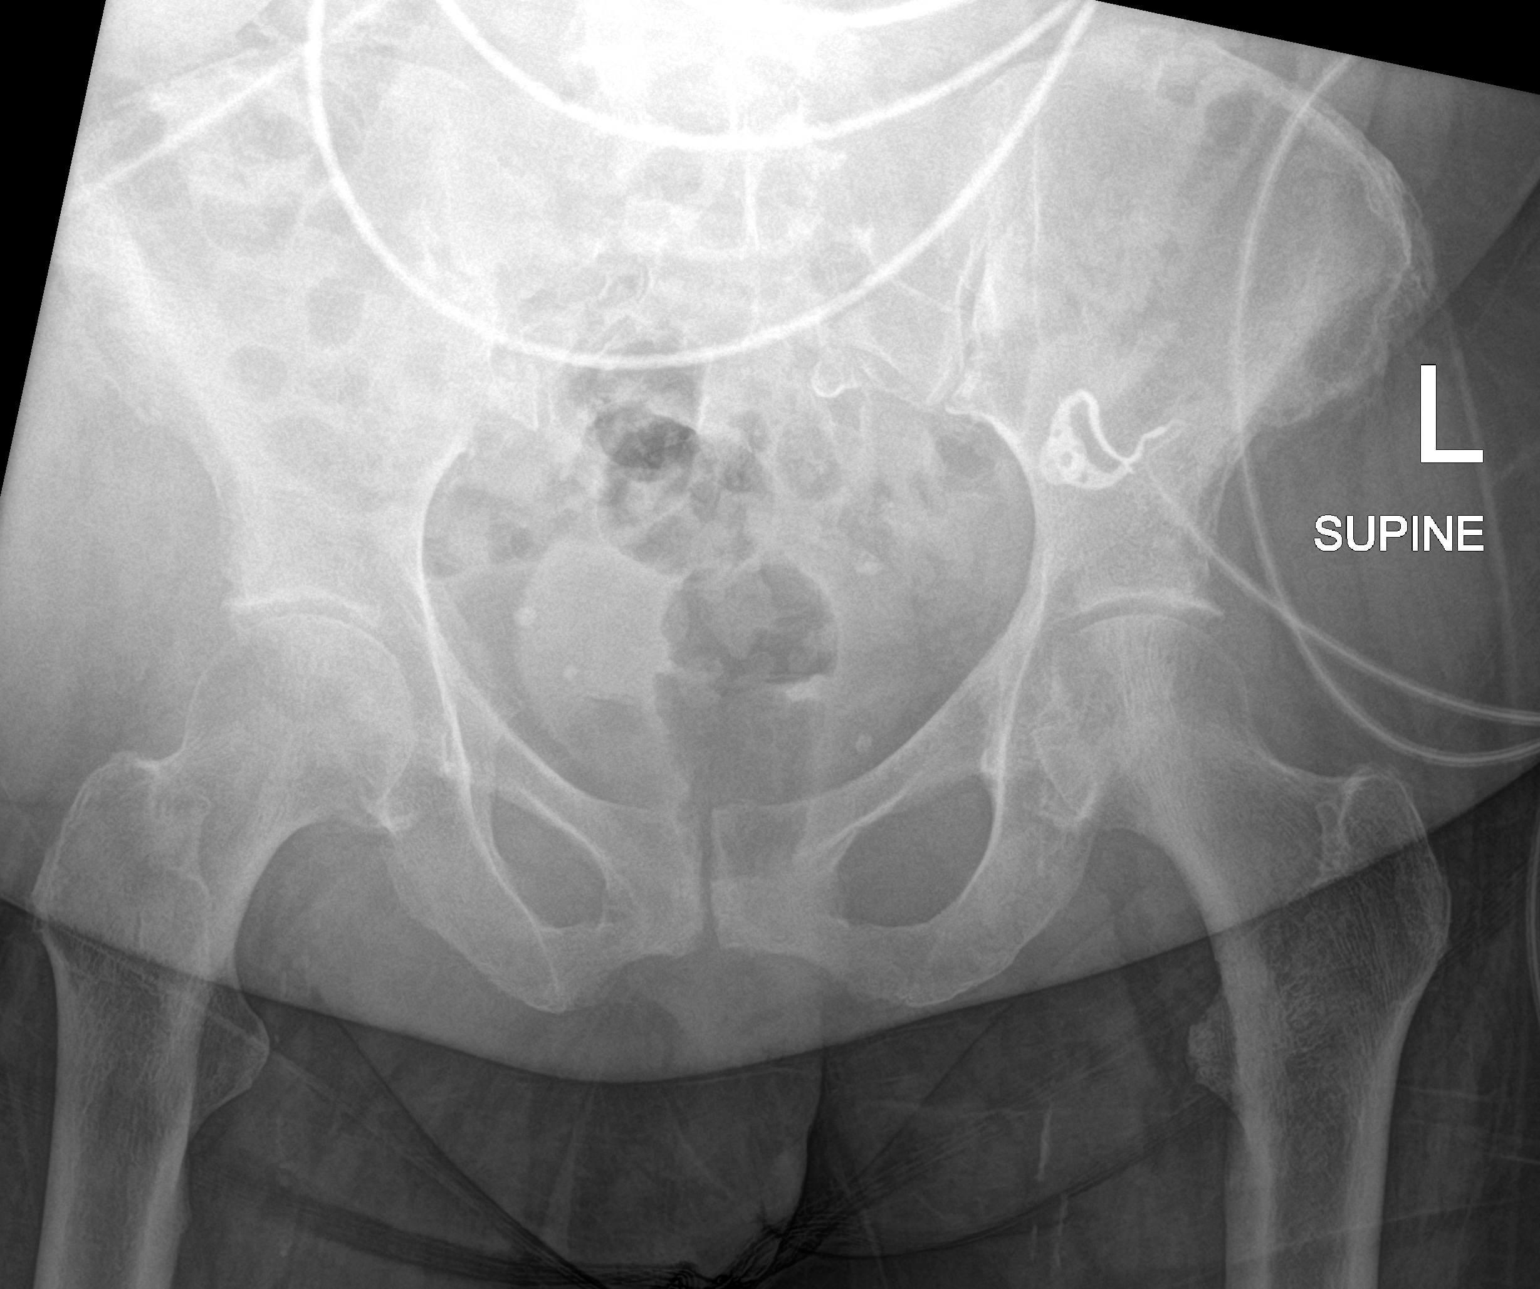

[pelvis ap (2 of 2)]
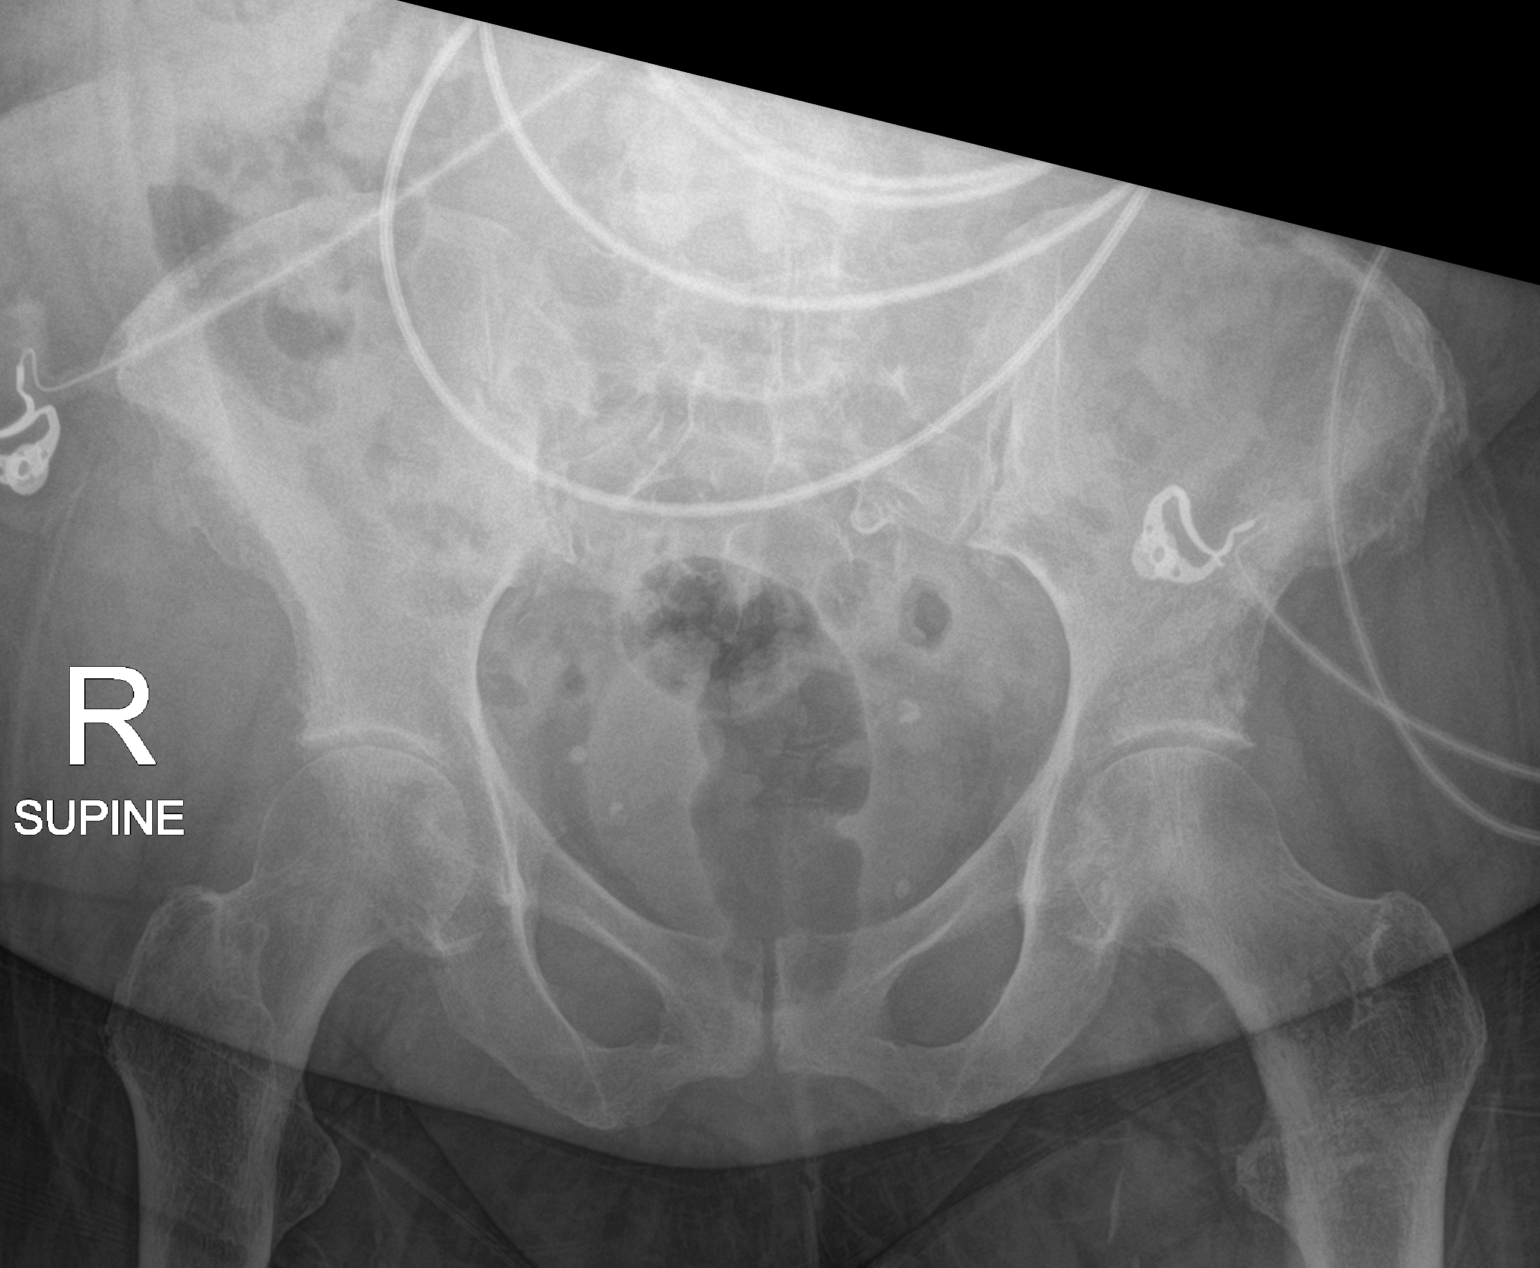

[2 of 2 positions shown; findings below may reference images not displayed]

FINDINGS: There is no evidence of pelvic fracture or diastasis. No pelvic bone
lesions are seen.
IMPRESSION: Negative.

## 2022-03-04 IMAGING — CR DG CHEST 2V
2 series · 2 of 2 positions shown · non-contrast
Comparison: May 26, 2021

CLINICAL DATA: Fall.

EXAM:
CHEST - 2 VIEW

[chest lat]
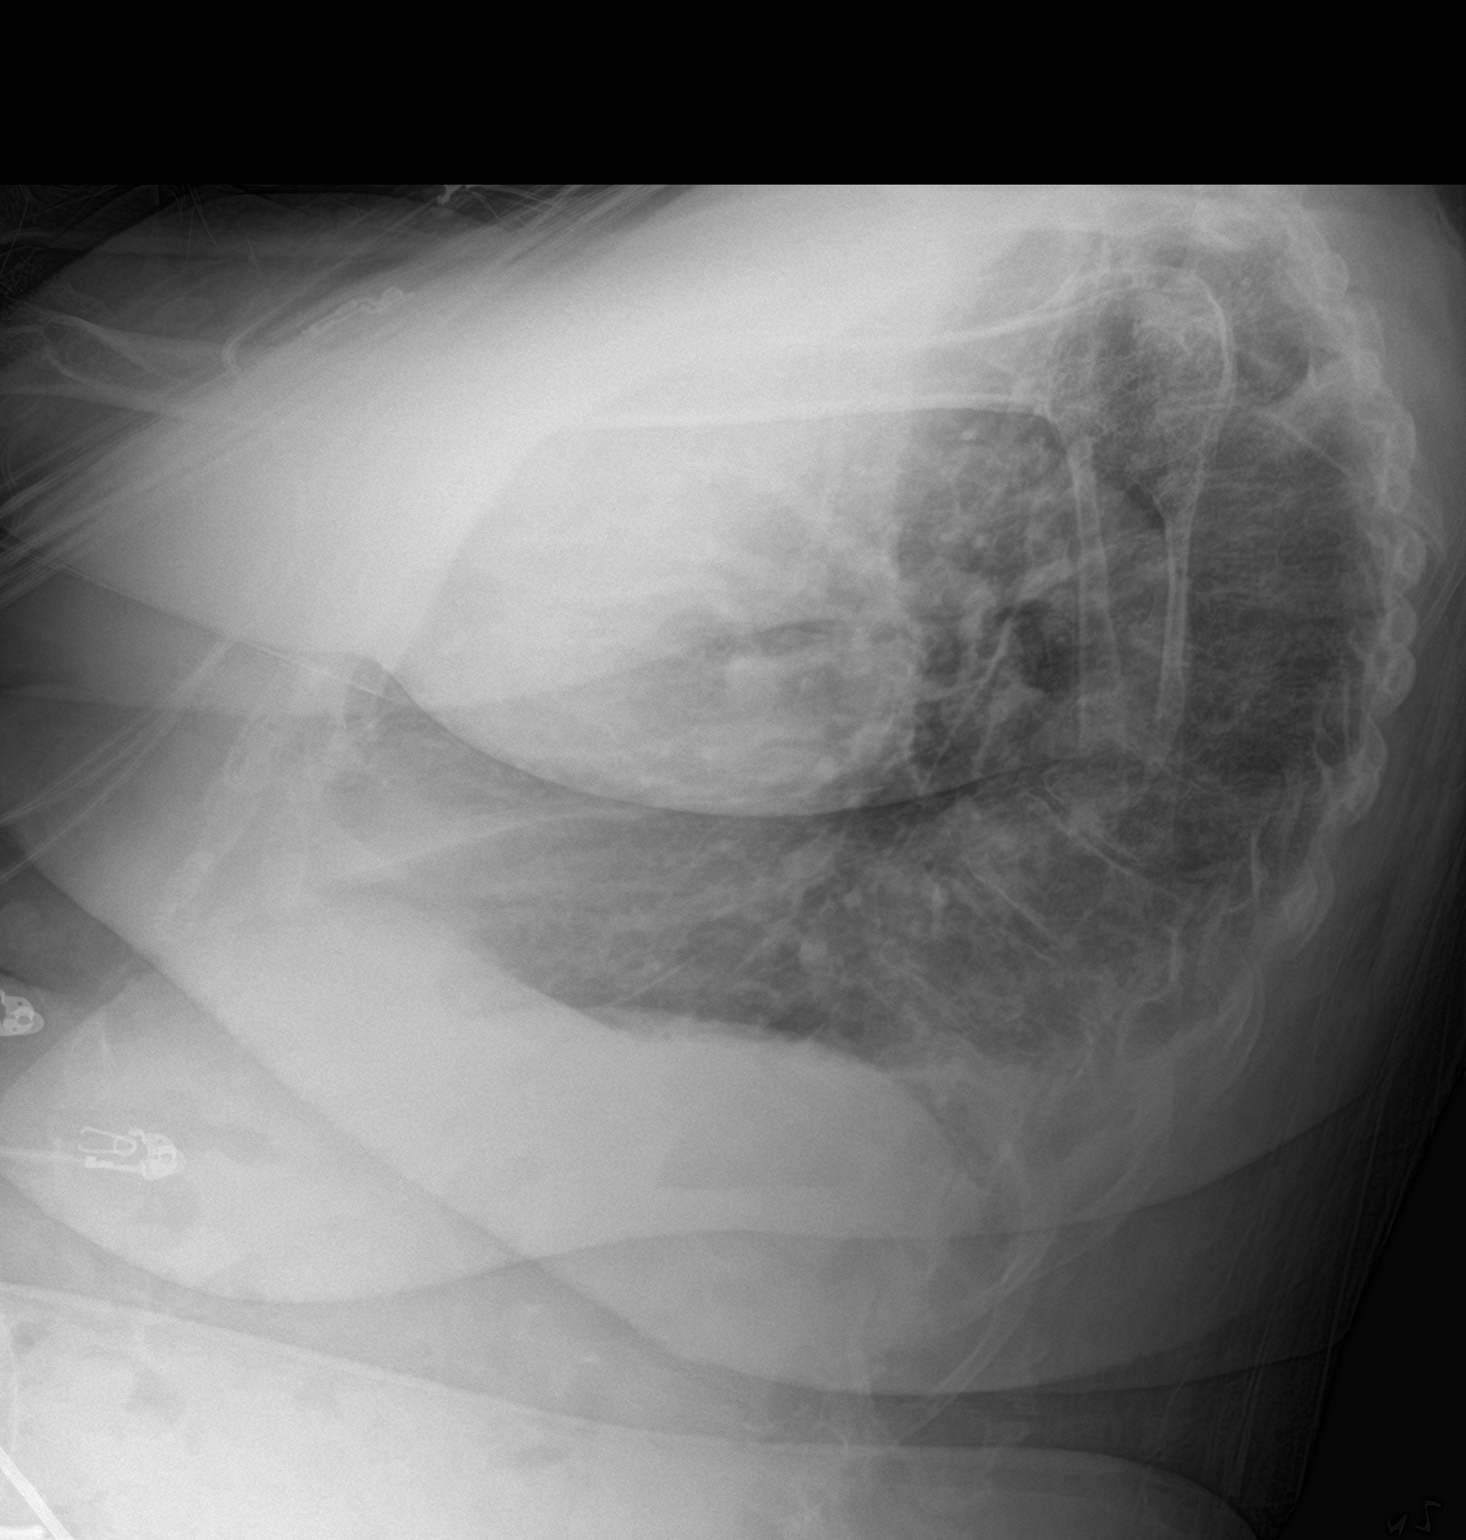

[chest ap]
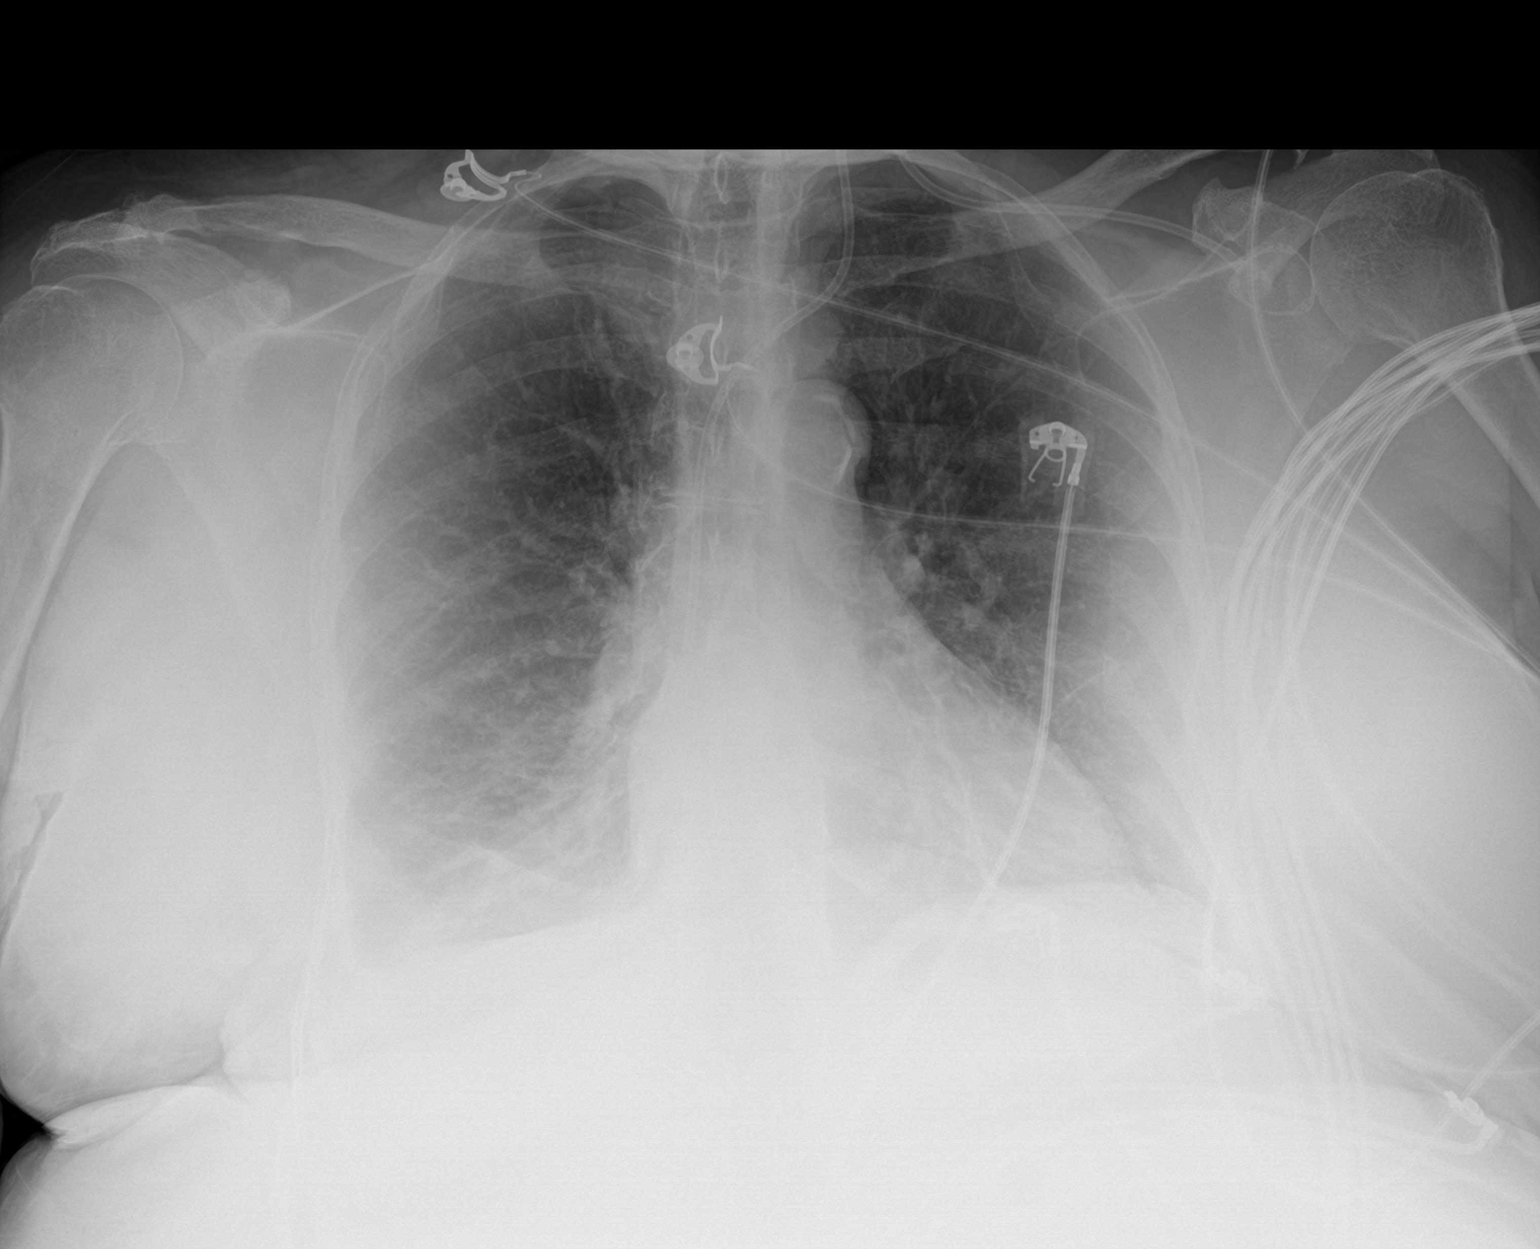

[2 of 2 positions shown; findings below may reference images not displayed]

FINDINGS: A new small right pleural effusion with underlying opacity is
identified. A left Port-A-Cath is stable. Mild streaky opacity in
the left retrocardiac region on the frontal view is improved since
Tuesday May, 2021. The cardiomediastinal silhouette is stable. No
pneumothorax. No nodules or masses.
IMPRESSION: 1. New small right pleural effusion with underlying opacity, likely
atelectasis.
2. Mild streaky opacity in left retrocardiac region is nonspecific
but probably scar or atelectasis.
3. Stable left Port-A-Cath.

## 2022-03-08 ENCOUNTER — Encounter: Payer: Self-pay | Admitting: Oncology

## 2022-03-08 ENCOUNTER — Non-Acute Institutional Stay: Payer: Medicare Other | Admitting: *Deleted

## 2022-03-08 DIAGNOSIS — Z515 Encounter for palliative care: Secondary | ICD-10-CM

## 2022-03-11 ENCOUNTER — Non-Acute Institutional Stay: Payer: Medicare Other

## 2022-03-11 ENCOUNTER — Non-Acute Institutional Stay: Payer: Medicare Other | Admitting: *Deleted

## 2022-03-11 DIAGNOSIS — Z515 Encounter for palliative care: Secondary | ICD-10-CM

## 2022-03-11 NOTE — Progress Notes (Signed)
[facility] PALLIATIVE CARE SW NOTE ? ?PATIENT NAME: [name] [name] ?DOB: [birth year] ?MRN: [id number] ? ?PRIMARY CARE PROVIDER: [name], DO ? ?RESPONSIBLE PARTY:  ?Acct ID - Guarantor Home Phone Work Phone Relationship Acct Type  ?[id number] [facility]* [phone]  Self P/F  ?   [address] [place],  [address]  ? ?SOCIAL WORK VISIT ? ?PC SW and RN-M. Howard completed joint visit with patient in her room at [facility]. Patient was in bed, awake and alert. She denied that she was having pain during this visit but admits to intermittent pain. Patient was verbal only when prompted and her verbalizations consisted of 1-3 word responses. Patient's responses were intermittently not relevant to the question indicating some confusion. Patient affect was flat, however she was pleasant with the team. Patient reported that her appetite is fair, however through consultation with the staff they indicate that patient is only taking  bites and sips of food and liquid. Patient is dependent for all ADL's and is 1-2 person assist for transfers. Patient does prefer to be in bed instead of getting up. The staff feel that patient may be suffering from depression.  ?Patient's daughter-[name] serve as her PCG. She has noted decline in patient and requested that patient be evaluated for hospice services. Patient has been discharged from therapy as she had poor progress. RN to discuss patient's status with Dr. Konrad Dolores, med director. Approval was provided for a hospice consult. Patient's daughter is also in process of applying for Medicaid for patient to cover and room and board at the SNF. However, her goal is for patient to be transferred to [facility] for end of life care when she is appropriate. ? ?CODE STATUS: DNR ?ADVANCE DIRECTIVES: Yes ?MOST FORM: Yes ? ?Duration of visit and documentation: 45 minutes ? ?Katheren Puller, LCSW ? ?

## 2022-03-11 NOTE — Progress Notes (Signed)
[place] PALLIATIVE CARE RN NOTE ? ?PATIENT NAME: [name] [name] ?DOB: 1946/08/20 ?MRN: [id number] ? ?PRIMARY CARE PROVIDER: [name], DO ? ?RESPONSIBLE PARTY: Elita Quick (daughter) ?Acct ID - Guarantor Home Phone Work Phone Relationship Acct Type  ?[id number] [facility]* [phone]  Self P/F  ?   [address] [place],  [address]  ? ?Covid-19 Pre-screening Negative ? ?Joint visit made with LCSW, M. Lonon. Met with patient in her room at [facility] as requested by her daughter [name]. Germain Osgood after visit to discuss status. ? ?Terminal diagnosis: Metastatic Breast cancer ? ?Cognitive: Patient with increased confusion and forgetfulness. Daughter concerned due to worsening short term memory. Able to answer some questions with 1-3 word responses but not always accurate. Staff feels she may be depressed. ? ?Pain: Denies pain during visit today. Has metastatic lesions on her right breast spreading to right shoulder area. Also has abdominal lesion. Pain mainly occurring during dressing changes. Tramadol recently changed to Oxycodone which has been more effective. ? ?Appetite: Continued decrease in appetite. Staff reports patient only taking in minimal bites and sips. Daughter reports pt inconsistent with taking her medications. Intermittent nausea/vomiting over past 2-3 weeks per dtr ? ?Mobility: Staff reports patient wants to stay in bed most of the time. She is total care with ADLs. Requires 1 person assist with pivot and transfer to wheelchair. Non-ambulatory. Was receiving therapy but discharged on 03/06/22 due to non-progression. PPS in Nov 2022 was 50%, now 20%. ? ?Bowel/Bladder: Incontinent of bladder, intermittently incontinent of bowels. ? ?Spoke with [name] who is wanting patient to return to hospice services. Patient was under hospice care with [facility] from Nov 2022 to Feb 2023. Services revoked in order for patient to receive therapy services. Hospice education and  referral process provided. Daughter is currently working on [employer] application as they are currently paying privately for patient's stay at SNF. Discussed case with Dr. Konrad Dolores who gave approval for me to proceed with obtaining hospice order. Communication sent to Stanley at facility requesting order to be faxed, which she did. Referral center to contact daughter for schedule hospice evaluation. Daughter is interested in transfer to [facility] once prognosis is believed to be 2 weeks or less. ? ?CODE STATUS: DNR ?ADVANCED DIRECTIVES: Y ?MOST FORM: yes ?PPS: 20% ? ? ?(Duration of visit and documentation 45 minutes) ? ? ?Daryl Eastern, RN BSN ? ?

## 2022-03-11 NOTE — Progress Notes (Signed)
[place] PALLIATIVE CARE RN NOTE ? ?PATIENT NAME: [name] [name] ?DOB: 06/11/1946 ?MRN: [id number] ? ?PRIMARY CARE PROVIDER: [name], DO ? ?RESPONSIBLE PARTY: Elita Quick (daughter) ?Acct ID - Guarantor Home Phone Work Phone Relationship Acct Type  ?[id number] [facility]* [phone]  Self P/F  ?   [address], [place], [place] [address]  ? ?Due to the COVID-19 crisis, this virtual check-in visit was done via telephone from my office and it was initiated and consent by this patient and or family. ? ?RN telephonic encounter completed with patient's daughter-[name]. She states that she is very concerned about patient's overall decline. Dtr reports patient experiencing pain from breast cancer lesions present on her right breast and is spreading to her right shoulder. She also has a lesion on her abdomen. She has been discharged from therapy as she was not progressing and didn't want to do therapy. She is not eating or drinking well. Patient was on hospice services prior to admission to [facility] SNF for rehab. She is requesting a visit to assess patient's current status. She is also interested in [facility] if possible when her prognosis is 2 weeks or less. RN/SW visit scheduled for 03/11/22'@1p'$ .  ? ? ?(Duration of visit and documentation 20 minutes) ? ? ?Daryl Eastern, RN BSN ? ?

## 2022-03-28 ENCOUNTER — Encounter: Payer: Self-pay | Admitting: Oncology

## 2022-04-04 ENCOUNTER — Other Ambulatory Visit (HOSPITAL_COMMUNITY): Payer: Self-pay

## 2022-04-11 DEATH — deceased

## 2022-10-15 ENCOUNTER — Other Ambulatory Visit (HOSPITAL_COMMUNITY): Payer: Self-pay

## 2024-12-06 ENCOUNTER — Other Ambulatory Visit: Payer: Self-pay
